# Patient Record
Sex: Female | Born: 1955 | Race: Black or African American | Hispanic: No | Smoking: Never smoker
Health system: Southern US, Community
[De-identification: ages and names within clinical notes are randomized; demographics above are authoritative.]

## PROBLEM LIST (undated history)

## (undated) DIAGNOSIS — I10 Essential (primary) hypertension: Secondary | ICD-10-CM

## (undated) DIAGNOSIS — K219 Gastro-esophageal reflux disease without esophagitis: Secondary | ICD-10-CM

## (undated) DIAGNOSIS — E119 Type 2 diabetes mellitus without complications: Secondary | ICD-10-CM

## (undated) DIAGNOSIS — N189 Chronic kidney disease, unspecified: Secondary | ICD-10-CM

## (undated) DIAGNOSIS — E785 Hyperlipidemia, unspecified: Secondary | ICD-10-CM

## (undated) DIAGNOSIS — R609 Edema, unspecified: Secondary | ICD-10-CM

## (undated) HISTORY — PX: NEPHRECTOMY: SHX65

## (undated) HISTORY — DX: Gastro-esophageal reflux disease without esophagitis: K21.9

## (undated) HISTORY — DX: Essential (primary) hypertension: I10

## (undated) HISTORY — DX: Hyperlipidemia, unspecified: E78.5

## (undated) HISTORY — PX: KIDNEY SURGERY: SHX687

## (undated) HISTORY — DX: Chronic kidney disease, unspecified: N18.9

## (undated) HISTORY — DX: Edema, unspecified: R60.9

## (undated) HISTORY — PX: TONSILLECTOMY: SUR1361

## (undated) HISTORY — PX: ABDOMINAL HYSTERECTOMY: SHX81

## (undated) HISTORY — DX: Type 2 diabetes mellitus without complications: E11.9

---

## 1999-04-18 ENCOUNTER — Other Ambulatory Visit: Admission: RE | Admit: 1999-04-18 | Discharge: 1999-04-18 | Payer: Self-pay | Admitting: Internal Medicine

## 2000-09-25 ENCOUNTER — Encounter: Admission: RE | Admit: 2000-09-25 | Discharge: 2000-09-25 | Payer: Self-pay | Admitting: Urology

## 2000-09-25 ENCOUNTER — Encounter: Payer: Self-pay | Admitting: Urology

## 2000-11-05 ENCOUNTER — Other Ambulatory Visit: Admission: RE | Admit: 2000-11-05 | Discharge: 2000-11-05 | Payer: Self-pay | Admitting: Gynecology

## 2000-12-17 ENCOUNTER — Encounter: Admission: RE | Admit: 2000-12-17 | Discharge: 2001-03-17 | Payer: Self-pay | Admitting: Internal Medicine

## 2002-05-30 ENCOUNTER — Other Ambulatory Visit: Admission: RE | Admit: 2002-05-30 | Discharge: 2002-05-30 | Payer: Self-pay | Admitting: Obstetrics and Gynecology

## 2004-09-26 ENCOUNTER — Ambulatory Visit: Payer: Self-pay | Admitting: Internal Medicine

## 2005-01-20 ENCOUNTER — Emergency Department (HOSPITAL_COMMUNITY): Admission: EM | Admit: 2005-01-20 | Discharge: 2005-01-21 | Payer: Self-pay | Admitting: Emergency Medicine

## 2005-05-13 ENCOUNTER — Emergency Department (HOSPITAL_COMMUNITY): Admission: EM | Admit: 2005-05-13 | Discharge: 2005-05-14 | Payer: Self-pay | Admitting: Emergency Medicine

## 2005-05-16 ENCOUNTER — Ambulatory Visit: Payer: Self-pay | Admitting: Internal Medicine

## 2006-02-19 ENCOUNTER — Ambulatory Visit: Payer: Self-pay | Admitting: Internal Medicine

## 2006-05-22 ENCOUNTER — Ambulatory Visit: Payer: Self-pay | Admitting: Internal Medicine

## 2006-05-22 LAB — CONVERTED CEMR LAB: Streptococcus, Group A Screen (Direct): NEGATIVE

## 2006-12-18 ENCOUNTER — Ambulatory Visit: Payer: Self-pay | Admitting: Internal Medicine

## 2006-12-18 LAB — CONVERTED CEMR LAB
ALT: 16 units/L (ref 0–40)
AST: 18 units/L (ref 0–37)
Albumin: 3.6 g/dL (ref 3.5–5.2)
Alkaline Phosphatase: 85 units/L (ref 39–117)
BUN: 14 mg/dL (ref 6–23)
Bilirubin, Direct: 0.1 mg/dL (ref 0.0–0.3)
CO2: 32 meq/L (ref 19–32)
Calcium: 10 mg/dL (ref 8.4–10.5)
Chloride: 97 meq/L (ref 96–112)
Cholesterol: 333 mg/dL (ref 0–200)
Creatinine, Ser: 0.8 mg/dL (ref 0.4–1.2)
Direct LDL: 235.9 mg/dL
GFR calc Af Amer: 97 mL/min
GFR calc non Af Amer: 80 mL/min
Glucose, Bld: 367 mg/dL — ABNORMAL HIGH (ref 70–99)
HDL: 55.9 mg/dL (ref >39.0)
Hgb A1c MFr Bld: 16.2 % — ABNORMAL HIGH (ref 4.6–6.0)
Potassium: 4.5 meq/L (ref 3.5–5.1)
Sodium: 140 meq/L (ref 135–145)
TSH: 2.86 microintl units/mL (ref 0.35–5.50)
Total Bilirubin: 0.8 mg/dL (ref 0.3–1.2)
Total CHOL/HDL Ratio: 6
Total Protein: 7 g/dL (ref 6.0–8.3)
Triglycerides: 151 mg/dL — ABNORMAL HIGH (ref 0–149)
VLDL: 30 mg/dL (ref 0–40)

## 2007-01-03 ENCOUNTER — Ambulatory Visit: Payer: Self-pay | Admitting: Internal Medicine

## 2007-01-15 ENCOUNTER — Ambulatory Visit: Payer: Self-pay | Admitting: Internal Medicine

## 2007-01-15 LAB — CONVERTED CEMR LAB
Bilirubin Urine: NEGATIVE
Hemoglobin, Urine: NEGATIVE
Ketones, ur: NEGATIVE mg/dL
Leukocytes, UA: NEGATIVE
Nitrite: NEGATIVE
Specific Gravity, Urine: 1.015 (ref 1.000–1.03)
Total Protein, Urine: NEGATIVE mg/dL
Urine Glucose: >=1000 mg/dL — CR
Urobilinogen, UA: 0.2 (ref 0.0–1.0)
pH: 6 (ref 5.0–8.0)

## 2007-02-19 ENCOUNTER — Encounter: Payer: Self-pay | Admitting: Internal Medicine

## 2007-02-19 DIAGNOSIS — R635 Abnormal weight gain: Secondary | ICD-10-CM | POA: Insufficient documentation

## 2007-02-19 DIAGNOSIS — I10 Essential (primary) hypertension: Secondary | ICD-10-CM

## 2007-02-19 DIAGNOSIS — E118 Type 2 diabetes mellitus with unspecified complications: Secondary | ICD-10-CM

## 2007-02-19 DIAGNOSIS — K219 Gastro-esophageal reflux disease without esophagitis: Secondary | ICD-10-CM | POA: Insufficient documentation

## 2007-02-19 DIAGNOSIS — E785 Hyperlipidemia, unspecified: Secondary | ICD-10-CM

## 2007-06-01 LAB — BASIC METABOLIC PANEL
BUN: 4 (ref 4–21)
Creatinine: 9.7 — AB (ref 0.5–1.1)
Glucose: 255
Potassium: 4.5 (ref 3.4–5.3)
Sodium: 141 (ref 137–147)

## 2007-06-01 LAB — HEPATIC FUNCTION PANEL
ALT: 5 — AB (ref 7–35)
AST: 16 (ref 13–35)
Alkaline Phosphatase: 125 (ref 25–125)

## 2008-04-29 ENCOUNTER — Emergency Department (HOSPITAL_COMMUNITY): Admission: EM | Admit: 2008-04-29 | Discharge: 2008-04-29 | Payer: Self-pay | Admitting: Emergency Medicine

## 2008-07-08 ENCOUNTER — Emergency Department (HOSPITAL_COMMUNITY): Admission: EM | Admit: 2008-07-08 | Discharge: 2008-07-08 | Payer: Self-pay | Admitting: Emergency Medicine

## 2008-07-17 ENCOUNTER — Telehealth: Payer: Self-pay | Admitting: Internal Medicine

## 2008-07-17 ENCOUNTER — Ambulatory Visit: Payer: Self-pay | Admitting: Internal Medicine

## 2008-07-21 ENCOUNTER — Telehealth (INDEPENDENT_AMBULATORY_CARE_PROVIDER_SITE_OTHER): Payer: Self-pay | Admitting: *Deleted

## 2008-08-25 ENCOUNTER — Telehealth: Payer: Self-pay | Admitting: Internal Medicine

## 2008-10-20 ENCOUNTER — Telehealth: Payer: Self-pay | Admitting: Internal Medicine

## 2008-12-11 ENCOUNTER — Ambulatory Visit: Payer: Self-pay | Admitting: Internal Medicine

## 2008-12-15 LAB — CONVERTED CEMR LAB
BUN: 13 mg/dL (ref 6–23)
CO2: 30 meq/L (ref 19–32)
Chloride: 102 meq/L (ref 96–112)
Direct LDL: 178.3 mg/dL
Glucose, Bld: 127 mg/dL — ABNORMAL HIGH (ref 70–99)
HDL: 55.9 mg/dL (ref >39.00)
Potassium: 4.6 meq/L (ref 3.5–5.1)
Sodium: 140 meq/L (ref 135–145)
Triglycerides: 101 mg/dL (ref 0.0–149.0)
VLDL: 20.2 mg/dL (ref 0.0–40.0)

## 2009-10-14 ENCOUNTER — Ambulatory Visit: Payer: Self-pay | Admitting: Internal Medicine

## 2009-10-14 LAB — CONVERTED CEMR LAB: Blood Glucose, Fingerstick: 164

## 2009-10-20 ENCOUNTER — Telehealth: Payer: Self-pay | Admitting: Internal Medicine

## 2010-07-26 NOTE — Assessment & Plan Note (Signed)
Summary: CPX / WILL BRING $125 - NO INS / CD   Vital Signs:  Patient profile:   55 year old female Height:      63 inches Weight:      153.25 pounds BMI:     27.25 O2 Sat:      99 % on Room air Temp:     98.1 degrees F oral Pulse rate:   70 / minute BP sitting:   140 / 78  (left arm) Cuff size:   regular  Vitals Entered By: Ernestene Mention (October 14, 2009 10:41 AM)  O2 Flow:  Room air CC: CPX--Per pt no symptoms or complaints./kb Is Patient Diabetic? Yes Did you bring your meter with you today? No Pain Assessment Patient in pain? no      CBG Result 164   CC:  CPX--Per pt no symptoms or complaints./kb.  History of Present Illness: Req   an oral surg  medical clearance She is planning to have:Teeth extraction and a partial next month  Current Medications (verified): 1)  Vitamin B Complex   Caps (B Complex Vitamins) .... Once Daily 2)  Klor-Con 20 Meq  Pack (Potassium Chloride) .... Once Daily 3)  Chlorthalidone 50 Mg  Tabs (Chlorthalidone) .... Take 1 Tablet By Mouth Every Morning 4)  Metformin Hcl 1000 Mg  Tabs (Metformin Hcl) .... Two Times A Day 5)  Vitamin D3 1000 Unit  Tabs (Cholecalciferol) .... Once Daily 6)  Furosemide 20 Mg Tabs (Furosemide) .... Take 1 Tab By Mouth Every Day  Allergies (verified): 1)  ! Penicillin V Potassium (Penicillin V Potassium) 2)  ! Sulfa 3)  ! Betadine Swab Aid (Povidone-Iodine) 4)  ! 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    Physical Exam  General:  well-developed and well-nourished.   Nose:  External nasal examination shows no deformity or inflammation. Nasal mucosa are pink and moist without lesions or exudates. Mouth:  Oral mucosa and oropharynx without lesions or exudates.  Teeth in good repair. Neck:  No deformities, masses, or tenderness noted. Lungs:  Normal respiratory effort, chest expands symmetrically. Lungs are clear to auscultation, no crackles or wheezes. Heart:  Normal rate and regular rhythm. S1 and S2 normal without gallop, murmur, click, rub or other extra sounds. Abdomen:  Bowel sounds positive,abdomen soft and non-tender without masses, organomegaly or hernias noted. Msk:  No deformity or scoliosis noted of thoracic or lumbar spine.   Neurologic:  No cranial nerve deficits noted. Station and gait are normal. Plantar reflexes are down-going bilaterally. DTRs are symmetrical throughout. Sensory, motor and coordinative functions appear intact. Skin:  Lipoma on L sacral area   Impression & Recommendations:  Problem # 1:  HYPERTENSION (ICD-401.9) Assessment Improved  Her updated medication list for this problem includes:    Chlorthalidone 50 Mg Tabs (Chlorthalidone) .Marland Kitchen... Take 1 tablet by mouth every morning    Furosemide 20 Mg Tabs (Furosemide) .Marland Kitchen... Take 1 tab by mouth every day  BP today: 140/78 Prior BP: 140/84 (12/11/2008)  Labs Reviewed: K+: 4.6 (12/11/2008)  Creat: : 0.8 (12/11/2008)   Chol: 252 (12/11/2008)   HDL: 55.90 (12/11/2008)   LDL: DEL (12/18/2006)   TG: 101.0 (12/11/2008)  Problem # 2:  DIABETES MELLITUS, TYPE II (ICD-250.00) Assessment: Unchanged  Her updated medication list for this problem includes:    Metformin Hcl 1000 Mg Tabs (Metformin hcl) .Marland Kitchen..Marland Kitchen Two times a day  Problem # 3:  WEIGHT GAIN (ICD-783.1) Assessment: Improved On diet  Problem # 4:  PREOPERATIVE EXAMINATION (ICD-V72.84) Assessment:  New Clear for surgery  Complete Medication List: 1)  Vitamin B Complex Caps (B complex vitamins) .... Once daily 2)  Klor-con 20 Meq Pack (Potassium chloride) .... Once daily 3)  Chlorthalidone 50 Mg Tabs (Chlorthalidone) .... Take 1 tablet by mouth every morning 4)  Metformin Hcl 1000 Mg Tabs (Metformin hcl) .... Two times a day 5)  Vitamin D3 1000 Unit Tabs (Cholecalciferol) .... Once daily 6)  Furosemide 20 Mg Tabs (Furosemide) .... Take 1 tab by mouth every day  Patient Instructions: 1)  Please schedule a follow-up appointment in 4 months. Prescriptions: METFORMIN HCL 1000 MG  TABS (METFORMIN HCL) two times a day  #180 x 3   Entered and Authorized by:   Cassandria Anger MD   Signed by:   Cassandria Anger MD on 10/14/2009   Method used:   Electronically to        [facility] (retail)       [phone]       [place], [place]  [address]       Ph: [phone]       Fax: [phone]   RxID:   [id number] CHLORTHALIDONE 50 MG  TABS (CHLORTHALIDONE) Take 1 tablet by mouth every morning  #90 x 3   Entered and Authorized by:   Cassandria Anger MD   Signed by:   Cassandria Anger MD on 10/14/2009   Method used:   Electronically to        [facility] (retail)       [phone]       [place], [place]  [address]       Ph: [phone]       Fax: [phone]   RxID:   [id number] KLOR-CON 20 MEQ  PACK (POTASSIUM CHLORIDE) once daily  #90 x 3   Entered and Authorized by:   Cassandria Anger MD   Signed by:   Cassandria Anger MD on 10/14/2009   Method used:   Electronically to        [facility] (retail)       [phone]       [place], [place]  [address]       Ph: [phone]       Fax: [phone]   RxID:   [id number]

## 2010-07-26 NOTE — Letter (Signed)
Summary: Generic Letter  [facility]   [place], [place] [address]   Phone: [phone]  Fax: [phone]    10/14/2009  RE: Mary Valencia [address] [place], [place]  [address]  Ms. ROTERT is medically clear for dental work/ surgery.           Sincerely,   Lew Dawes MD

## 2010-07-26 NOTE — Progress Notes (Signed)
  Phone Note From Pharmacy   Caller: [facility] Summary of Call: Received fax from pharmacy regarding  lotensin and amaryl rx denial (no longer on med list) stating that pt is requesting refills. Spoke with patient who advised that it was an error and she does still use the medication. She is requesting 90day Initial call taken by: Estell Harpin CMA,  October 20, 2009 2:30 PM    New/Updated Medications: LOTENSIN 40 MG TABS (BENAZEPRIL HCL) Take 1 tablet by mouth once a day AMARYL 1 MG TABS (GLIMEPIRIDE) Take 1 tablet by mouth two times a day Prescriptions: AMARYL 1 MG TABS (GLIMEPIRIDE) Take 1 tablet by mouth two times a day  #180 x 3   Entered by:   Estell Harpin CMA   Authorized by:   Cassandria Anger MD   Signed by:   Estell Harpin CMA on 10/20/2009   Method used:   Electronically to        [facility] (retail)       [phone]       [place], [place]  [address]       Ph: [phone]       Fax: [phone]   RxID:   [id number] LOTENSIN 40 MG TABS (BENAZEPRIL HCL) Take 1 tablet by mouth once a day  #90 x 3   Entered by:   Estell Harpin CMA   Authorized by:   Cassandria Anger MD   Signed by:   Estell Harpin CMA on 10/20/2009   Method used:   Electronically to        [facility].* (retail)       [phone]       [place], [place]  [address]       Ph: [phone]       Fax: [phone]   RxID:   [id number]

## 2010-09-02 ENCOUNTER — Telehealth: Payer: Self-pay | Admitting: Internal Medicine

## 2010-09-05 ENCOUNTER — Ambulatory Visit (INDEPENDENT_AMBULATORY_CARE_PROVIDER_SITE_OTHER)
Admission: RE | Admit: 2010-09-05 | Discharge: 2010-09-05 | Disposition: A | Discharge status: Home / Self Care | Payer: Self-pay | Source: Ambulatory Visit | Attending: Internal Medicine | Admitting: Internal Medicine

## 2010-09-05 ENCOUNTER — Encounter: Payer: Self-pay | Admitting: Internal Medicine

## 2010-09-05 ENCOUNTER — Other Ambulatory Visit: Payer: Self-pay | Admitting: Internal Medicine

## 2010-09-05 ENCOUNTER — Ambulatory Visit (INDEPENDENT_AMBULATORY_CARE_PROVIDER_SITE_OTHER): Payer: Self-pay | Admitting: Internal Medicine

## 2010-09-05 ENCOUNTER — Other Ambulatory Visit: Payer: Self-pay

## 2010-09-05 DIAGNOSIS — R000 Tachycardia, unspecified: Secondary | ICD-10-CM

## 2010-09-05 DIAGNOSIS — E119 Type 2 diabetes mellitus without complications: Secondary | ICD-10-CM

## 2010-09-05 DIAGNOSIS — R0602 Shortness of breath: Secondary | ICD-10-CM

## 2010-09-05 DIAGNOSIS — J309 Allergic rhinitis, unspecified: Secondary | ICD-10-CM

## 2010-09-05 LAB — TSH: TSH: 1.19 u[IU]/mL (ref 0.35–5.50)

## 2010-09-05 LAB — CBC WITH DIFFERENTIAL/PLATELET
Basophils Absolute: 0 10*3/uL (ref 0.0–0.1)
Eosinophils Absolute: 0 10*3/uL (ref 0.0–0.7)
HCT: 40 % (ref 36.0–46.0)
Hemoglobin: 13.6 g/dL (ref 12.0–15.0)
Lymphocytes Relative: 22.6 % (ref 12.0–46.0)
Lymphs Abs: 2 10*3/uL (ref 0.7–4.0)
MCHC: 33.9 g/dL (ref 30.0–36.0)
Monocytes Relative: 3.8 % (ref 3.0–12.0)
Neutro Abs: 6.4 10*3/uL (ref 1.4–7.7)
Platelets: 260 10*3/uL (ref 150.0–400.0)
RDW: 12.5 % (ref 11.5–14.6)

## 2010-09-05 LAB — BASIC METABOLIC PANEL
BUN: 27 mg/dL — ABNORMAL HIGH (ref 6–23)
CO2: 29 mEq/L (ref 19–32)
Calcium: 10 mg/dL (ref 8.4–10.5)
GFR: 69.66 mL/min (ref >60.00)
Glucose, Bld: 348 mg/dL — ABNORMAL HIGH (ref 70–99)

## 2010-09-05 LAB — URINALYSIS, ROUTINE W REFLEX MICROSCOPIC
Ketones, ur: NEGATIVE
Specific Gravity, Urine: 1.02 (ref 1.000–1.030)
Total Protein, Urine: NEGATIVE
Urine Glucose: >=1000
Urobilinogen, UA: 0.2 (ref 0.0–1.0)
pH: 5.5 (ref 5.0–8.0)

## 2010-09-05 LAB — HEPATIC FUNCTION PANEL
AST: 16 U/L (ref 0–37)
Albumin: 4.4 g/dL (ref 3.5–5.2)
Total Bilirubin: 0.4 mg/dL (ref 0.3–1.2)

## 2010-09-12 ENCOUNTER — Telehealth: Payer: Self-pay | Admitting: Internal Medicine

## 2010-09-13 NOTE — Assessment & Plan Note (Signed)
Summary: body temp dropped to 96.6/heart palpatations and shortness of...   Vital Signs:  Patient profile:   55 year old female Height:      63 inches (160.02 cm) Weight:      149.25 pounds (67.84 kg) BMI:     26.53 O2 Sat:      98 % on Room air Temp:     98.3 degrees F (36.83 degrees C) oral Pulse rate:   113 / minute Resp:     16 per minute BP sitting:   142 / 78  (right arm) Cuff size:   regular  Vitals Entered By: Mackey Birchwood CMA(AAMA) (September 05, 2010 2:11 PM)  O2 Flow:  Room air CC: Pt c/o Low body temperature over past 2 months; palpatations and SOB/sls,cma Comments Pt states that she believes her Potassium levels may be low. Pt has not checked BSL in 6-7 months; states she can not find her BS meter.   CC:  Pt c/o Low body temperature over past 2 months; palpatations and SOB/sls and cma.  History of Present Illness: C/o SOB and palpitations x 1 wk, loose stools. No CP. C/o low T 97.1 C/o possibly low  K She is going to [place] for 14 d  Current Medications (verified): 1)  Vitamin B Complex   Caps (B Complex Vitamins) .... Once Daily 2)  Klor-Con 20 Meq  Pack (Potassium Chloride) .... Once Daily 3)  Chlorthalidone 50 Mg  Tabs (Chlorthalidone) .... Take 1 Tablet By Mouth Every Morning 4)  Metformin Hcl 1000 Mg  Tabs (Metformin Hcl) .... Two Times A Day 5)  Vitamin D3 1000 Unit  Tabs (Cholecalciferol) .... Once Daily 6)  Furosemide 20 Mg Tabs (Furosemide) .... Take 1 Tab By Mouth Every Day As Needed 7)  Lotensin 40 Mg Tabs (Benazepril Hcl) .... Take 1 Tablet By Mouth Once A Day 8)  Amaryl 1 Mg Tabs (Glimepiride) .... Take 1 Tablet By Mouth Two Times A Day  Allergies (verified): 1)  ! Penicillin V Potassium (Penicillin V Potassium) 2)  ! Sulfa 3)  ! Betadine Swab Aid (Povidone-Iodine) 4)  ! 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    Physical Exam  General:  well-developed and overweight-appearing.   Mouth:  Oral mucosa and oropharynx without lesions or exudates.  Teeth in good repair. Neck:  No deformities, masses, or tenderness noted. Lungs:  Normal respiratory effort, chest expands symmetrically. Lungs are clear to auscultation, no crackles or wheezes. Heart:  Normal rate and regular rhythm. S1 and S2 normal without gallop, murmur, click, rub or other extra sounds. Abdomen:  Bowel sounds positive,abdomen soft and non-tender without masses, organomegaly or hernias noted. Msk:  No deformity or scoliosis noted of thoracic or lumbar spine.   Neurologic:  No cranial nerve deficits noted. Station and gait are normal. Plantar reflexes are down-going bilaterally. DTRs are symmetrical throughout. Sensory, motor and coordinative functions appear intact. Skin:  Lipoma on L sacral area   Impression & Recommendations:  Problem # 1:  DYSPNEA (ICD-786.05) unclear etiology; poss due to #2 Assessment New  Orders: EKG w/  Interpretation (93000) T-2 View CXR, Same Day (C9260230.5TC) T-D-Dimer Fibrin Derivatives Quantitive [id number]) TLB-B12, Serum-Total ONLY [id number]) TLB-BNP (B-Natriuretic Peptide) (83880-BNPR) TLB-BMP (Basic Metabolic Panel-BMET) (99991111) TLB-CBC Platelet - w/Differential (85025-CBCD) TLB-Hepatic/Liver Function Pnl (80076-HEPATIC) TLB-TSH (Thyroid Stimulating Hormone) (84443-TSH) TLB-Udip ONLY (81003-UDIP) TLB-CK-MB (Creatine Kinase MB) (82553-CKMB)  Problem # 2:  TACHYCARDIA (ICD-785.0) - poss PSVT Assessment: New  Orders: EKG w/ Interpretation  (93000) T-D-Dimer Fibrin Derivatives Quantitive [id number]) TLB-Magnesium (Mg) (83735-MG) TLB-B12, Serum-Total ONLY [id number]) TLB-BNP (B-Natriuretic Peptide) (83880-BNPR) TLB-BMP (Basic Metabolic Panel-BMET) (99991111) TLB-CBC Platelet - w/Differential (85025-CBCD) TLB-Hepatic/Liver Function Pnl (80076-HEPATIC) TLB-TSH (Thyroid Stimulating Hormone) (84443-TSH) TLB-Udip ONLY (81003-UDIP) TLB-CK-MB (Creatine Kinase MB) (82553-CKMB)  Problem # 3:  HYPERTENSION (ICD-401.9) Assessment: Unchanged  Her updated medication list for this problem includes:    Chlorthalidone 50 Mg Tabs (Chlorthalidone) .Marland Kitchen... Take 1 tablet by mouth every morning    Furosemide 20 Mg Tabs (Furosemide) .Marland Kitchen... Take 1 tab by mouth every day as needed    Lotensin 40 Mg Tabs (Benazepril hcl) .Marland Kitchen... Take 1 tablet by mouth once a day    Toprol Xl 25 Mg Xr24h-tab (Metoprolol succinate) .Marland Kitchen... 1 by mouth qd  BP today: 142/78 Prior BP: 140/78 (10/14/2009)  Labs Reviewed: K+: 4.6 (12/11/2008) Creat: : 0.8 (12/11/2008)   Chol: 252 (12/11/2008)   HDL: 55.90 (12/11/2008)   LDL: DEL (12/18/2006)   TG: 101.0 (12/11/2008)  Problem # 4:  DIABETES MELLITUS, TYPE II (ICD-250.00) Assessment: Deteriorated  The following medications were removed from the medication list:    Amaryl 1 Mg Tabs (Glimepiride) .Marland Kitchen... Take 1 tablet by mouth two times a day Her updated medication list for this problem includes:    Metformin Hcl 1000 Mg Tabs (Metformin hcl) .Marland Kitchen..Marland Kitchen Two times a day    Lotensin 40 Mg Tabs (Benazepril hcl) .Marland Kitchen... Take 1 tablet by mouth once a day    Amaryl 2 Mg Tabs (Glimepiride) .Marland Kitchen... 1 by mouth bid  Orders: T-D-Dimer Fibrin Derivatives Quantitive [id number]) TLB-B12, Serum-Total ONLY [id number]) TLB-BNP (B-Natriuretic Peptide) (83880-BNPR) TLB-BMP (Basic Metabolic Panel-BMET) (99991111) TLB-CBC Platelet - w/Differential (85025-CBCD) TLB-Hepatic/Liver Function Pnl (80076-HEPATIC) TLB-TSH (Thyroid Stimulating  Hormone) (84443-TSH) TLB-Udip ONLY (81003-UDIP) TLB-CK-MB (Creatine Kinase MB) (82553-CKMB)  Labs Reviewed: Creat: 0.8 (12/11/2008)    Reviewed HgBA1c results: 7.9 (12/11/2008)  16.2 (12/18/2006)  Complete Medication List: 1)  Vitamin B Complex Caps (B complex vitamins) .... Once daily 2)  Klor-con 20 Meq Pack (Potassium chloride) .... Once daily 3)  Chlorthalidone 50 Mg Tabs (Chlorthalidone) .... Take 1 tablet by mouth every morning 4)  Metformin Hcl 1000 Mg Tabs (Metformin hcl) .... Two times a day 5)  Vitamin D3 1000 Unit Tabs (Cholecalciferol) .... Once daily 6)  Furosemide 20 Mg Tabs (Furosemide) .... Take 1 tab by mouth every day as needed 7)  Lotensin 40 Mg Tabs (Benazepril hcl) .... Take 1 tablet by mouth once a day 8)  Toprol Xl 25 Mg Xr24h-tab (Metoprolol succinate) .Marland Kitchen.. 1 by mouth qd 9)  Loratadine 10 Mg Tabs (Loratadine) .Marland Kitchen.. 1 by mouth once daily as needed allergies 10)  Amaryl 2 Mg Tabs (Glimepiride) .Marland Kitchen.. 1 by mouth bid  Patient Instructions: 1)  Please schedule a follow-up appointment in 1 month. 2)  Call if you are not better in a reasonable amount of time or if worse. Go to ER if feeling really bad!  Prescriptions: AMARYL 2 MG TABS (GLIMEPIRIDE) 1 by mouth bid  #60 x 11   Entered and Authorized by:   Cassandria Anger MD   Signed by:  Cassandria Anger MD on 09/09/2010   Method used:   Print then Mail to Patient   RxID:   [id number] AMARYL 1 MG TABS (GLIMEPIRIDE) Take 1 tablet by mouth two times a day  #180 x 3   Entered and Authorized by:   Cassandria Anger MD   Signed by:   Cassandria Anger MD on 09/05/2010   Method used:   Print then Give to Patient   RxID:   [id number] LOTENSIN 40 MG TABS (BENAZEPRIL HCL) Take 1 tablet by mouth once a day  #90 x 3   Entered and Authorized by:   Cassandria Anger MD   Signed by:   Cassandria Anger MD on 09/05/2010   Method used:   Print then Give to Patient   [id number] VITAMIN D3  1000 UNIT  TABS (CHOLECALCIFEROL) once daily  #100 x 3   Entered and Authorized by:   Cassandria Anger MD   Signed by:   Cassandria Anger MD on 09/05/2010   Method used:   Print then Give to Patient   RxID:   [id number] METFORMIN HCL 1000 MG  TABS (METFORMIN HCL) two times a day  #180 x 3   Entered and Authorized by:   Cassandria Anger MD   Signed by:   Cassandria Anger MD on 09/05/2010   Method used:   Print then Give to Patient   RxID:   [id number] CHLORTHALIDONE 50 MG  TABS (CHLORTHALIDONE) Take 1 tablet by mouth every morning  #90 x 3   Entered and Authorized by:   Cassandria Anger MD   Signed by:   Cassandria Anger MD on 09/05/2010   Method used:   Print then Give to Patient   RxID:   [id number] KLOR-CON 20 MEQ  PACK (POTASSIUM CHLORIDE) once daily  #90 x 3   Entered and Authorized by:   Cassandria Anger MD   Signed by:   Cassandria Anger MD on 09/05/2010   Method used:   Print then Give to Patient   RxID:   [id number] LORATADINE 10 MG TABS (LORATADINE) 1 by mouth once daily as needed allergies  #90 x 3   Entered and Authorized by:   Cassandria Anger MD   Signed by:   Cassandria Anger MD on 09/05/2010   Method used:   Print then Give to Patient   RxID:   [id number] TOPROL XL 25 MG XR24H-TAB (METOPROLOL SUCCINATE) 1 by mouth qd  #30 x 11   Entered and Authorized by:   Cassandria Anger MD   Signed by:   Cassandria Anger MD on 09/05/2010   Method used:   Print then Give to Patient   RxID:   [id number]    Orders Added: 1)  EKG w/ Interpretation [93000] 2)  T-2 View CXR, Same Day [71020.5TC] 3)  T-D-Dimer Fibrin Derivatives Quantitive [id number] 4)  TLB-Magnesium (Mg) [83735-MG] 5)  Est. Patient Level IV [id number] 6)  TLB-B12, Serum-Total ONLY [82607-B12] 7)  TLB-BNP (B-Natriuretic Peptide) [83880-BNPR] 8)  TLB-BMP (Basic Metabolic Panel-BMET) 123456 9)  TLB-CBC Platelet - w/Differential  [85025-CBCD] 10)  TLB-Hepatic/Liver Function Pnl [80076-HEPATIC] 11)  TLB-TSH (Thyroid Stimulating Hormone) [84443-TSH] 12)  TLB-Udip ONLY [81003-UDIP] 13)  TLB-CK-MB (Creatine Kinase MB) [82553-CKMB]

## 2010-09-13 NOTE — Progress Notes (Signed)
Summary: Med Problem  Phone Note Call from Patient   Caller: Patient Summary of Call: Pt called to schedule appt for next week but scheduler felt she needed to speak w/nurse while on phone. Pt staes that her body temperature had been dropping over the past 2 months and was at 96.6 this morning. Pt was asked about the SOB & heart Palpatations symptoms that she gave as reason for calling to schedule appt. Pt stated that it was "much, much better" today and that it has been occuring over the past week. Pt stated that she has been taking Potassium meds over the past three days that expired 2 Years ago.? Informed Pt that she should never take expired meds and that if symptoms returned and/or became worse to go to ED to be evaluated. Pt agreed to this and confirmed that she will keep appt for [facility] 09/05/10 @ 2:00pm. Initial call taken by: Mackey Birchwood [facility]),  September 02, 2010 12:07 PM  Follow-up for Phone Call        Noted. Thank you!  Follow-up by: Cassandria Anger MD,  September 02, 2010 10:32 PM

## 2010-09-14 ENCOUNTER — Telehealth: Payer: Self-pay | Admitting: *Deleted

## 2010-09-14 MED ORDER — GLIMEPIRIDE 2 MG PO TABS: 2.0000 mg | ORAL_TABLET | Freq: Two times a day (BID) | ORAL | Status: DC

## 2010-09-14 NOTE — Telephone Encounter (Signed)
Pt needs rf of med. Pt aware rx sent in

## 2010-09-22 NOTE — Progress Notes (Signed)
Summary: Lipids checked  Phone Note Call from Patient   Caller: Patient Summary of Call: pt called concerned about not having lipids checked last visit. She wants it checked prior to April 11th appt....ok to add order??? Initial call taken by: Jonathon Resides, [facility]),  September 12, 2010 9:11 AM  Follow-up for Phone Call        ok Thank you!  Follow-up by: Cassandria Anger MD,  September 12, 2010 6:08 PM  Additional Follow-up for Phone Call Additional follow up Details #1::        Please add to [facility], thanks Additional Follow-up by: Charlsie Quest, CMA,  September 12, 2010 6:10 PM

## 2010-09-29 ENCOUNTER — Other Ambulatory Visit (INDEPENDENT_AMBULATORY_CARE_PROVIDER_SITE_OTHER): Payer: Self-pay

## 2010-09-29 DIAGNOSIS — E785 Hyperlipidemia, unspecified: Secondary | ICD-10-CM

## 2010-09-29 LAB — LIPID PANEL
HDL: 62.8 mg/dL (ref >39.00)
Total CHOL/HDL Ratio: 5
VLDL: 18.2 mg/dL (ref 0.0–40.0)

## 2010-09-29 LAB — HEPATIC FUNCTION PANEL
AST: 13 U/L (ref 0–37)
Albumin: 3.7 g/dL (ref 3.5–5.2)
Alkaline Phosphatase: 75 U/L (ref 39–117)
Bilirubin, Direct: 0.1 mg/dL (ref 0.0–0.3)
Total Bilirubin: 0.5 mg/dL (ref 0.3–1.2)

## 2010-09-29 LAB — LDL CHOLESTEROL, DIRECT: Direct LDL: 207.8 mg/dL

## 2010-09-30 ENCOUNTER — Other Ambulatory Visit: Payer: Self-pay

## 2010-10-05 ENCOUNTER — Encounter: Payer: Self-pay | Admitting: Internal Medicine

## 2010-10-05 ENCOUNTER — Ambulatory Visit (INDEPENDENT_AMBULATORY_CARE_PROVIDER_SITE_OTHER): Payer: Self-pay | Admitting: Internal Medicine

## 2010-10-05 DIAGNOSIS — K219 Gastro-esophageal reflux disease without esophagitis: Secondary | ICD-10-CM

## 2010-10-05 DIAGNOSIS — R000 Tachycardia, unspecified: Secondary | ICD-10-CM

## 2010-10-05 DIAGNOSIS — E785 Hyperlipidemia, unspecified: Secondary | ICD-10-CM

## 2010-10-05 DIAGNOSIS — E119 Type 2 diabetes mellitus without complications: Secondary | ICD-10-CM

## 2010-10-05 DIAGNOSIS — J309 Allergic rhinitis, unspecified: Secondary | ICD-10-CM

## 2010-10-05 MED ORDER — GLIMEPIRIDE 4 MG PO TABS: 4.0000 mg | ORAL_TABLET | Freq: Two times a day (BID) | ORAL | Status: DC

## 2010-10-05 MED ORDER — POTASSIUM CHLORIDE CRYS ER 20 MEQ PO TBCR: 20.0000 meq | EXTENDED_RELEASE_TABLET | Freq: Two times a day (BID) | ORAL | Status: DC

## 2010-10-05 NOTE — Assessment & Plan Note (Signed)
On Rx 

## 2010-10-05 NOTE — Assessment & Plan Note (Signed)
Better  

## 2010-10-05 NOTE — Assessment & Plan Note (Signed)
Better off her herbal med

## 2010-10-05 NOTE — Progress Notes (Signed)
  Subjective:    Patient ID: Mary Valencia, female    DOB: [birth year], 55 y.o.   MRN: [id number]  HPI The patient presents for a follow-up of palpitations - stopped after she ended an [place] herb compound for menopause 2 d ago,  chronic hypertension, chronic dyslipidemia, type 2 diabetes not well controlled with medicines (>260)     Review of Systems  Constitutional: Positive for diaphoresis. Negative for fatigue.  HENT: Negative for congestion.   Respiratory: Negative for cough and choking.   Cardiovascular: Negative for chest pain, palpitations (better) and leg swelling.  Genitourinary: Negative for dysuria and pelvic pain.  Musculoskeletal: Negative for back pain and gait problem.  Neurological: Negative for headaches.  Psychiatric/Behavioral: Negative for dysphoric mood.       Objective:   Physical Exam  Constitutional: She appears well-developed and well-nourished. No distress.  HENT:  Head: Normocephalic.  Right Ear: External ear normal.  Left Ear: External ear normal.  Nose: Nose normal.  Mouth/Throat: Oropharynx is clear and moist.  Eyes: Conjunctivae are normal. Pupils are equal, round, and reactive to light. Right eye exhibits no discharge. Left eye exhibits no discharge.  Neck: Normal range of motion. Neck supple. No JVD present. No tracheal deviation present. No thyromegaly present.  Cardiovascular: Normal rate, regular rhythm and normal heart sounds.   Pulmonary/Chest: No stridor. No respiratory distress. She has no wheezes.  Abdominal: Soft. Bowel sounds are normal. She exhibits no distension and no mass. There is no tenderness. There is no rebound and no guarding.  Musculoskeletal: She exhibits no edema and no tenderness.  Lymphadenopathy:    She has no cervical adenopathy.  Neurological: She displays normal reflexes. No cranial nerve deficit. She exhibits normal muscle tone. Coordination normal.  Skin: No rash noted. No erythema.  Psychiatric: She has a  normal mood and affect. Her behavior is normal. Judgment and thought content normal.        Lab Results  Component Value Date   WBC 8.7 09/05/2010   HGB 13.6 09/05/2010   HCT 40.0 09/05/2010   PLT 260.0 09/05/2010   CHOL 289* 09/29/2010   TRIG 91.0 09/29/2010   HDL 62.80 09/29/2010   LDLDIRECT 207.8 09/29/2010   ALT 10 09/29/2010   AST 13 09/29/2010   NA 138 09/05/2010   K 3.9 09/05/2010   CL 95* 09/05/2010   CREATININE 1.1 09/05/2010   BUN 27* 09/05/2010   CO2 29 09/05/2010   TSH 1.19 09/05/2010   HGBA1C 7.9* 12/11/2008     Assessment & Plan:  DIABETES MELLITUS, TYPE II Not well controlled. See med change  HYPERLIPIDEMIA On diet  TACHYCARDIA Better off her herbal med  GERD Better.  ALLERGIC RHINITIS On Rx

## 2010-10-05 NOTE — Assessment & Plan Note (Signed)
  On diet  

## 2010-10-05 NOTE — Assessment & Plan Note (Signed)
Not well controlled. See med change

## 2010-10-10 LAB — RAPID STREP SCREEN (MED CTR MEBANE ONLY): Streptococcus, Group A Screen (Direct): NEGATIVE

## 2010-10-10 LAB — GLUCOSE, CAPILLARY
Glucose-Capillary: 319 mg/dL — ABNORMAL HIGH (ref 70–99)
Glucose-Capillary: 465 mg/dL — ABNORMAL HIGH (ref 70–99)

## 2010-12-14 ENCOUNTER — Other Ambulatory Visit: Payer: Self-pay | Admitting: Internal Medicine

## 2011-01-16 ENCOUNTER — Ambulatory Visit: Payer: Self-pay | Admitting: Internal Medicine

## 2011-01-18 ENCOUNTER — Other Ambulatory Visit: Payer: Self-pay

## 2011-01-18 ENCOUNTER — Ambulatory Visit (INDEPENDENT_AMBULATORY_CARE_PROVIDER_SITE_OTHER): Payer: Self-pay | Admitting: Internal Medicine

## 2011-01-18 ENCOUNTER — Encounter: Payer: Self-pay | Admitting: Internal Medicine

## 2011-01-18 DIAGNOSIS — N939 Abnormal uterine and vaginal bleeding, unspecified: Secondary | ICD-10-CM | POA: Insufficient documentation

## 2011-01-18 DIAGNOSIS — E119 Type 2 diabetes mellitus without complications: Secondary | ICD-10-CM

## 2011-01-18 DIAGNOSIS — I10 Essential (primary) hypertension: Secondary | ICD-10-CM

## 2011-01-18 DIAGNOSIS — N898 Other specified noninflammatory disorders of vagina: Secondary | ICD-10-CM

## 2011-01-18 DIAGNOSIS — R000 Tachycardia, unspecified: Secondary | ICD-10-CM

## 2011-01-18 LAB — WET PREP, GENITAL

## 2011-01-18 MED ORDER — GLIPIZIDE 5 MG PO TABS: 5.0000 mg | ORAL_TABLET | Freq: Two times a day (BID) | ORAL | Status: DC

## 2011-01-18 NOTE — Assessment & Plan Note (Signed)
Resolved

## 2011-01-18 NOTE — Progress Notes (Signed)
  Subjective:    Patient ID: Mary Valencia, female    DOB: 1956/04/07, 55 y.o.   MRN: [id number]  HPI  C/o elev glucose. CBG 200-270 range C/o vag bleeding on 4 mg Amaryl; switched to 1 mg bid - bleeding stopped. She had a hysterectomy.  Review of Systems  Constitutional: Negative for chills, activity change, appetite change, fatigue and unexpected weight change.  HENT: Negative for congestion, mouth sores and sinus pressure.   Eyes: Negative for visual disturbance.  Respiratory: Negative for cough and chest tightness.   Gastrointestinal: Negative for nausea and abdominal pain.  Genitourinary: Negative for frequency, difficulty urinating and vaginal pain.  Musculoskeletal: Negative for back pain and gait problem.  Skin: Negative for pallor and rash.  Neurological: Negative for dizziness, tremors, weakness, numbness and headaches.  Psychiatric/Behavioral: Negative for confusion and sleep disturbance.       Objective:   Physical Exam  Constitutional: She appears well-developed. No distress.       obese  HENT:  Head: Normocephalic.  Right Ear: External ear normal.  Left Ear: External ear normal.  Nose: Nose normal.  Mouth/Throat: Oropharynx is clear and moist.  Eyes: Conjunctivae are normal. Pupils are equal, round, and reactive to light. Right eye exhibits no discharge. Left eye exhibits no discharge.  Neck: Normal range of motion. Neck supple. No JVD present. No tracheal deviation present. No thyromegaly present.  Cardiovascular: Normal rate, regular rhythm and normal heart sounds.   Pulmonary/Chest: No stridor. No respiratory distress. She has no wheezes.  Abdominal: Soft. Bowel sounds are normal. She exhibits no distension and no mass. There is no tenderness. There is no rebound and no guarding.  Genitourinary: Guaiac negative stool. Vaginal discharge (mild) found.       Vag and rect exam - no mass Vag mucosa is atrophic  Musculoskeletal: She exhibits no edema and no  tenderness.  Lymphadenopathy:    She has no cervical adenopathy.  Neurological: She displays normal reflexes. No cranial nerve deficit. She exhibits normal muscle tone. Coordination normal.  Skin: No rash noted. No erythema.  Psychiatric: She has a normal mood and affect. Her behavior is normal. Judgment and thought content normal.          Assessment & Plan:

## 2011-01-18 NOTE — Patient Instructions (Signed)
Goal sugars 120-150

## 2011-01-18 NOTE — Assessment & Plan Note (Signed)
On Rx 

## 2011-01-18 NOTE — Assessment & Plan Note (Signed)
better 

## 2011-01-18 NOTE — Assessment & Plan Note (Signed)
Change to Glipizide

## 2011-01-19 ENCOUNTER — Telehealth: Payer: Self-pay | Admitting: Internal Medicine

## 2011-01-19 NOTE — Telephone Encounter (Signed)
Mary Valencia, please, inform patient that vag prep was ok Thx

## 2011-01-19 NOTE — Telephone Encounter (Signed)
Pt informed

## 2011-03-01 ENCOUNTER — Ambulatory Visit: Payer: Self-pay | Admitting: Internal Medicine

## 2011-03-06 ENCOUNTER — Other Ambulatory Visit: Payer: Self-pay | Admitting: *Deleted

## 2011-03-06 MED ORDER — METOPROLOL SUCCINATE ER 25 MG PO TB24: 25.0000 mg | ORAL_TABLET | Freq: Every day | ORAL | Status: DC

## 2011-03-28 LAB — GLUCOSE, CAPILLARY: Glucose-Capillary: 236 — ABNORMAL HIGH

## 2011-03-28 LAB — CBC
HCT: 35.7 — ABNORMAL LOW
MCV: 83.5
Platelets: 261
RDW: 12
WBC: 8.3

## 2011-03-28 LAB — POCT CARDIAC MARKERS
CKMB, poc: 1.3
Myoglobin, poc: 91.2
Troponin i, poc: <0.05
Troponin i, poc: <0.05

## 2011-03-28 LAB — DIFFERENTIAL
Basophils Absolute: 0
Basophils Relative: 0
Eosinophils Absolute: 0
Eosinophils Relative: 1
Neutrophils Relative %: 62

## 2011-03-28 LAB — POCT I-STAT, CHEM 8
HCT: 36
Hemoglobin: 12.2
Potassium: 4.2
Sodium: 139
TCO2: 32

## 2011-05-03 ENCOUNTER — Telehealth: Payer: Self-pay | Admitting: *Deleted

## 2011-05-03 NOTE — Telephone Encounter (Signed)
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 Please advise ok?

## 2011-05-03 NOTE — Telephone Encounter (Signed)
Fax to: [facility] @ [phone]

## 2011-05-03 NOTE — Telephone Encounter (Signed)
Ok thx.

## 2011-05-04 NOTE — Telephone Encounter (Signed)
Letter signed/faxed to number below. 

## 2011-05-04 NOTE — Telephone Encounter (Signed)
Letter printed/pending MD sig.

## 2011-07-28 ENCOUNTER — Telehealth: Payer: Self-pay | Admitting: *Deleted

## 2011-07-28 NOTE — Telephone Encounter (Signed)
Pt states that she experienced a needle stick after helping husband check his blood sugar and help with his insulin. Pt is asking for MD's advisement regarding needle stick. (Called pt at number requested to get more information, no answer, unable to leave message)

## 2011-07-31 NOTE — Telephone Encounter (Signed)
Just watch it. We can discuss it at the next OV.  Thx

## 2011-08-01 NOTE — Telephone Encounter (Signed)
Called pt- spoke to husband who state pt is not at home and will return after 3 pm. Will try again later.

## 2011-08-01 NOTE — Telephone Encounter (Signed)
Pt informed

## 2011-08-08 ENCOUNTER — Other Ambulatory Visit: Payer: Self-pay | Admitting: Internal Medicine

## 2011-09-18 ENCOUNTER — Other Ambulatory Visit: Payer: Self-pay | Admitting: *Deleted

## 2011-09-18 MED ORDER — METOPROLOL SUCCINATE ER 25 MG PO TB24: 25.0000 mg | ORAL_TABLET | Freq: Every day | ORAL | Status: DC

## 2011-09-25 ENCOUNTER — Encounter: Payer: Self-pay | Admitting: Internal Medicine

## 2011-09-25 ENCOUNTER — Telehealth: Payer: Self-pay

## 2011-09-25 ENCOUNTER — Ambulatory Visit (INDEPENDENT_AMBULATORY_CARE_PROVIDER_SITE_OTHER): Payer: Self-pay | Admitting: Internal Medicine

## 2011-09-25 VITALS — BP 132/74 | HR 95 | Temp 98.2°F | Ht 62.0 in | Wt 132.5 lb

## 2011-09-25 DIAGNOSIS — I10 Essential (primary) hypertension: Secondary | ICD-10-CM

## 2011-09-25 DIAGNOSIS — R0789 Other chest pain: Secondary | ICD-10-CM

## 2011-09-25 DIAGNOSIS — E785 Hyperlipidemia, unspecified: Secondary | ICD-10-CM

## 2011-09-25 DIAGNOSIS — E119 Type 2 diabetes mellitus without complications: Secondary | ICD-10-CM

## 2011-09-25 MED ORDER — INSULIN GLARGINE 100 UNIT/ML ~~LOC~~ SOLN: 5.0000 [IU] | Freq: Every day | SUBCUTANEOUS | Status: DC

## 2011-09-25 MED ORDER — LINAGLIPTIN-METFORMIN HCL 2.5-500 MG PO TABS: 1.0000 | ORAL_TABLET | Freq: Two times a day (BID) | ORAL | Status: DC

## 2011-09-25 NOTE — Telephone Encounter (Signed)
[facility] Triage Call Report Triage Record Num: [id number] Operator: Arther Abbott Patient Name: Mary Valencia Call Date & Time: 09/23/2011 5:07:56AM Patient Phone: [phone] PCP: Walker Kehr Patient Gender: Female PCP Fax : [phone] Patient DOB: 1955/07/20 Practice Name: [facility] Reason for Call: Caller: Abelino Derrick; PCP: Walker Kehr; CB#: [phone];In hospital in [place] and wants to speak to MD about Stress Test. Pt. states currently in ER in [place] and states was having chest pain. "They have checked out everything here and everything was okay, my blood sugar is 540 so there was some concerns about that, but they want to do a Stress Test and I dont want them too. I just want to wait and come back to [place]. The doctor here said to call my doctor and see how they feel about that." Advised pt. that MD on call would defer to ER MD since he has all lab work and information. Pt. voiced understanding. Protocol(s) Used: Office Note Recommended Outcome per Protocol: Information Noted and Sent to Office Reason for Outcome: Caller information to office Care Advice: ~ 09/23/2011 5:14:31AM Page 1 of 1 [email or web address]

## 2011-09-25 NOTE — Patient Instructions (Signed)
See primary MD in [place] this week please!

## 2011-09-25 NOTE — Progress Notes (Signed)
Patient ID: Mary Valencia, female   DOB: 12/08/1955, 56 y.o.   MRN: [id number]  Subjective:    Patient ID: Mary Valencia, female    DOB: Sep 17, 1955, 56 y.o.   MRN: [id number]  HPI  F/u hosp adm last Fri for CP,  blurred vision and CBG 568 and BP 201/98. MI was ruled out.She had a card consult there inpatient. She declined a stress test there.  She lives in [place], [place] 2 h away   BP Readings from Last 3 Encounters:  09/25/11 132/74  01/18/11 140/88  10/05/10 148/80   Wt Readings from Last 3 Encounters:  09/25/11 132 lb 8 oz (60.102 kg)  01/18/11 149 lb (67.586 kg)  10/05/10 152 lb (68.947 kg)       Review of Systems  Constitutional: Negative for chills, activity change, appetite change, fatigue and unexpected weight change.  HENT: Negative for congestion, mouth sores and sinus pressure.   Eyes: Negative for visual disturbance.  Respiratory: Negative for cough and chest tightness.   Gastrointestinal: Negative for nausea and abdominal pain.  Genitourinary: Negative for frequency, difficulty urinating and vaginal pain.  Musculoskeletal: Negative for back pain and gait problem.  Skin: Negative for pallor and rash.  Neurological: Negative for dizziness, tremors, weakness, numbness and headaches.  Psychiatric/Behavioral: Negative for confusion and sleep disturbance.       Objective:   Physical Exam  Constitutional: She appears well-developed. No distress.       obese  HENT:  Head: Normocephalic.  Right Ear: External ear normal.  Left Ear: External ear normal.  Nose: Nose normal.  Mouth/Throat: Oropharynx is clear and moist.  Eyes: Conjunctivae are normal. Pupils are equal, round, and reactive to light. Right eye exhibits no discharge. Left eye exhibits no discharge.  Neck: Normal range of motion. Neck supple. No JVD present. No tracheal deviation present. No thyromegaly present.  Cardiovascular: Normal rate, regular rhythm and normal heart sounds.   Pulmonary/Chest:  No stridor. No respiratory distress. She has no wheezes.  Abdominal: Soft. Bowel sounds are normal. She exhibits no distension and no mass. There is no tenderness. There is no rebound and no guarding.  Genitourinary: Guaiac negative stool. No vaginal discharge found.  Musculoskeletal: She exhibits no edema and no tenderness.  Lymphadenopathy:    She has no cervical adenopathy.  Neurological: She displays normal reflexes. No cranial nerve deficit. She exhibits normal muscle tone. Coordination normal.  Skin: No rash noted. No erythema.  Psychiatric: She has a normal mood and affect. Her behavior is normal. Judgment and thought content normal.   Lab Results  Component Value Date   WBC 8.7 09/05/2010   HGB 13.6 09/05/2010   HCT 40.0 09/05/2010   PLT 260.0 09/05/2010   GLUCOSE 348* 09/05/2010   CHOL 289* 09/29/2010   TRIG 91.0 09/29/2010   HDL 62.80 09/29/2010   LDLDIRECT 207.8 09/29/2010   ALT 10 09/29/2010   AST 13 09/29/2010   NA 138 09/05/2010   K 3.9 09/05/2010   CL 95* 09/05/2010   CREATININE 1.1 09/05/2010   BUN 27* 09/05/2010   CO2 29 09/05/2010   TSH 1.19 09/05/2010   HGBA1C 7.9* 12/11/2008          Assessment & Plan:

## 2011-09-26 ENCOUNTER — Encounter: Payer: Self-pay | Admitting: Internal Medicine

## 2011-09-26 NOTE — Assessment & Plan Note (Addendum)
Start Lantus Given novolog here She will see a new PCP in [place] this week Glucometer given

## 2011-09-26 NOTE — Assessment & Plan Note (Signed)
Continue with current prescription therapy as reflected on the Med list.  

## 2011-09-26 NOTE — Assessment & Plan Note (Signed)
She was d/c'd after r/o MI and cardiol consult. She will see a cardiologist this week. Cont ASA 325 mg/d

## 2011-09-29 ENCOUNTER — Telehealth: Payer: Self-pay | Admitting: *Deleted

## 2011-09-29 NOTE — Telephone Encounter (Signed)
Patient would like to know if she can increase her Insulin  From [5] units to [7] units to manage CBG better. Please advise.

## 2011-09-29 NOTE — Telephone Encounter (Signed)
Yes. Thx.

## 2011-10-02 NOTE — Telephone Encounter (Signed)
Informed patient

## 2011-10-04 ENCOUNTER — Other Ambulatory Visit: Payer: Self-pay | Admitting: Internal Medicine

## 2011-12-28 ENCOUNTER — Emergency Department (HOSPITAL_BASED_OUTPATIENT_CLINIC_OR_DEPARTMENT_OTHER)
Admission: EM | Admit: 2011-12-28 | Discharge: 2011-12-28 | Disposition: A | Discharge status: Home / Self Care | Payer: Self-pay | Attending: Emergency Medicine | Admitting: Emergency Medicine

## 2011-12-28 ENCOUNTER — Encounter (HOSPITAL_BASED_OUTPATIENT_CLINIC_OR_DEPARTMENT_OTHER): Payer: Self-pay | Admitting: *Deleted

## 2011-12-28 DIAGNOSIS — R5383 Other fatigue: Secondary | ICD-10-CM

## 2011-12-28 DIAGNOSIS — R531 Weakness: Secondary | ICD-10-CM

## 2011-12-28 DIAGNOSIS — E785 Hyperlipidemia, unspecified: Secondary | ICD-10-CM | POA: Insufficient documentation

## 2011-12-28 DIAGNOSIS — R5381 Other malaise: Secondary | ICD-10-CM | POA: Insufficient documentation

## 2011-12-28 DIAGNOSIS — Z794 Long term (current) use of insulin: Secondary | ICD-10-CM | POA: Insufficient documentation

## 2011-12-28 DIAGNOSIS — I10 Essential (primary) hypertension: Secondary | ICD-10-CM | POA: Insufficient documentation

## 2011-12-28 DIAGNOSIS — E119 Type 2 diabetes mellitus without complications: Secondary | ICD-10-CM | POA: Insufficient documentation

## 2011-12-28 DIAGNOSIS — K219 Gastro-esophageal reflux disease without esophagitis: Secondary | ICD-10-CM | POA: Insufficient documentation

## 2011-12-28 DIAGNOSIS — Z79899 Other long term (current) drug therapy: Secondary | ICD-10-CM | POA: Insufficient documentation

## 2011-12-28 LAB — URINALYSIS, ROUTINE W REFLEX MICROSCOPIC
Glucose, UA: 250 mg/dL — AB
Hgb urine dipstick: NEGATIVE
Protein, ur: NEGATIVE mg/dL
Specific Gravity, Urine: 1.02 (ref 1.005–1.030)
pH: 5.5 (ref 5.0–8.0)

## 2011-12-28 LAB — CBC WITH DIFFERENTIAL/PLATELET
Basophils Absolute: 0 10*3/uL (ref 0.0–0.1)
Eosinophils Absolute: 0 10*3/uL (ref 0.0–0.7)
Eosinophils Relative: 0 % (ref 0–5)
Lymphocytes Relative: 29 % (ref 12–46)
MCH: 28.1 pg (ref 26.0–34.0)
MCV: 81 fL (ref 78.0–100.0)
Neutrophils Relative %: 65 % (ref 43–77)
Platelets: 338 10*3/uL (ref 150–400)
RDW: 12 % (ref 11.5–15.5)
WBC: 7 10*3/uL (ref 4.0–10.5)

## 2011-12-28 LAB — COMPREHENSIVE METABOLIC PANEL
ALT: 6 U/L (ref 0–35)
AST: 10 U/L (ref 0–37)
Calcium: 9.8 mg/dL (ref 8.4–10.5)
Potassium: 4 mEq/L (ref 3.5–5.1)
Sodium: 139 mEq/L (ref 135–145)
Total Protein: 7.1 g/dL (ref 6.0–8.3)

## 2011-12-28 LAB — URINE MICROSCOPIC-ADD ON

## 2011-12-28 MED ORDER — SODIUM CHLORIDE 0.9 % IV BOLUS (SEPSIS)
1000.0000 mL | Freq: Once | INTRAVENOUS | Status: AC
  Administered 2011-12-28: 1000 mL via INTRAVENOUS

## 2011-12-28 NOTE — ED Notes (Signed)
States she is concerned that she is loosing weight. Has had a 7 pound wt loss over the past month.

## 2011-12-28 NOTE — ED Notes (Signed)
Weakness x 2 days. States she was admitted to the hospital in [place] where she lived a couple of weeks ago for renal insufficiency. She just moved here to be closer to her family.

## 2011-12-28 NOTE — ED Provider Notes (Signed)
History     CSN: [id number]  Arrival date & time 12/28/11  1503   First MD Initiated Contact with Patient 12/28/11 1525      Chief Complaint  Patient presents with  . Weakness    (Consider location/radiation/quality/duration/timing/severity/associated sxs/prior treatment) HPI  H/o IDDM, HTN, HLD pw weakness x 2 days. Diffuse. +fatigue. Denies CP/SOB/F/c/n/v/abdominal pain. Denies hematuria/dysuria/freq/urgency.  States fasting glucose has been in the 140s. Denies headache, dizziness. C/O chronic hip pain- at baseline. Dec appetite. Eating less. States that she's losing weight. She is monitoring carbohydrates. Has lost 6-7lbs since June 18th but states she's been losing weight since Feb 127lbs.  Was at OSH 6/18 for dehydration and ARI Cr 1.6. 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 Type II or unspecified type diabetes mellitus without mention of complication, not stated as uncontrolled   . GERD (gastroesophageal reflux disease)   . Hyperlipidemia   . HTN (hypertension)   . Allergic rhinitis     Past Surgical History  Procedure Date  . Nephrectomy   . Abdominal hysterectomy     partial    Family History  Problem Relation Age of Onset  . Diabetes Other   . Heart disease Mother 28    MI  . Heart disease Father 36    MI  . Alcohol abuse Sister   . Kidney disease Sister   . Heart disease Brother     ?CHF ?CAD  . Heart disease Maternal Aunt     History  Substance Use Topics  . Smoking status: Never Smoker   . Smokeless tobacco: Not on file  . Alcohol Use: No    OB History    Grav Para Term Preterm Abortions TAB SAB Ect Mult Living                  Review of Systems  All other systems reviewed and are negative.  except as noted HPI   Allergies  Betadine; Iodine; Penicillins; Sulfonamide derivatives; Tetracyclines & related; and Naproxen  Home Medications   Current  Outpatient Rx  Name Route Sig Dispense Refill  . ASPIRIN 81 MG PO TABS Oral Take 81 mg by mouth daily.    . SUPER B COMPLEX PO TABS Oral Take 1 tablet by mouth daily.    Marland Kitchen BENAZEPRIL HCL 40 MG PO TABS Oral Take 40 mg by mouth daily.      Marland Kitchen VITAMIN D-3 5000 UNITS PO TABS Oral Take 1 tablet by mouth daily.    . INSULIN GLARGINE 100 UNIT/ML [place] SOLN Subcutaneous Inject 10 Units into the skin daily.    Marland Kitchen KRILL OIL PO Oral Take 1 capsule by mouth daily.    Marland Kitchen LINAGLIPTIN-METFORMIN HCL 2.5-500 MG PO TABS Oral Take 1 tablet by mouth 2 (two) times daily. 60 tablet 11  . METOPROLOL TARTRATE 50 MG PO TABS Oral Take 50 mg by mouth 2 (two) times daily.    Marland Kitchen POTASSIUM CHLORIDE CRYS ER 20 MEQ PO TBCR Oral Take 40 mEq by mouth every other day.       BP 144/83  Pulse 92  Temp 98 F (36.7 C) (Oral)  Resp 20  SpO2 100%  Physical Exam  Nursing note and vitals reviewed. Constitutional: She is oriented to person, place, and time. She appears well-developed.  HENT:  Head: Atraumatic.  Mouth/Throat: Oropharynx is clear and moist.  Eyes: Conjunctivae and EOM are normal. Pupils are  equal, round, and reactive to light.  Neck: Normal range of motion. Neck supple.  Cardiovascular: Normal rate, regular rhythm, normal heart sounds and intact distal pulses.   Pulmonary/Chest: Effort normal and breath sounds normal. No respiratory distress. She has no wheezes. She has no rales. She exhibits no tenderness.  Abdominal: Soft. She exhibits no distension. There is no tenderness. There is no rebound and no guarding.  Musculoskeletal: Normal range of motion. She exhibits no edema and no tenderness.  Neurological: She is alert and oriented to person, place, and time. No cranial nerve deficit. She exhibits normal muscle tone. Coordination normal.  Skin: Skin is warm and dry. No rash noted.  Psychiatric: She has a normal mood and affect.    Date: 12/30/2011  Rate: 82  Rhythm: normal sinus rhythm  QRS Axis: normal   Intervals: normal  ST/T Wave abnormalities: normal  Conduction Disutrbances:none  Narrative Interpretation:   Old EKG Reviewed: none available   ED Course  Procedures (including critical care time)  Labs Reviewed  URINALYSIS, ROUTINE W REFLEX MICROSCOPIC - Abnormal; Notable for the following:    Glucose, UA 250 (*)     Bilirubin Urine MODERATE (*)     Ketones, ur 40 (*)     Leukocytes, UA SMALL (*)     All other components within normal limits  URINE MICROSCOPIC-ADD ON - Abnormal; Notable for the following:    Squamous Epithelial / LPF FEW (*)     All other components within normal limits  CBC WITH DIFFERENTIAL - Abnormal; Notable for the following:    HCT 35.8 (*)     All other components within normal limits  COMPREHENSIVE METABOLIC PANEL - Abnormal; Notable for the following:    Glucose, Bld 201 (*)     Total Bilirubin 0.2 (*)     GFR calc non Af Amer 61 (*)     GFR calc Af Amer 71 (*)     All other components within normal limits  GLUCOSE, CAPILLARY - Abnormal; Notable for the following:    Glucose-Capillary 211 (*)     All other components within normal limits  TROPONIN I   No results found.   1. Weakness generalized   2. Fatigue     MDM  Fatigue and gen weakness x 2 days, similar to previous. Mild dehydration. Given IVF in ED. Her Cr has improved from previous admission. Her history and physical are nonfocal. Doubt ACS. Clinically, appears well. Ambulatory and feeling better in ED. Contaminated urine sample without nitrites. No EMC precluding discharge at this time. Given Precautions for return. PMD f/u.         Blair Heys, MD 12/30/11 [phone]

## 2012-01-19 ENCOUNTER — Other Ambulatory Visit: Payer: Self-pay | Admitting: Orthopaedic Surgery

## 2012-01-19 DIAGNOSIS — M5416 Radiculopathy, lumbar region: Secondary | ICD-10-CM

## 2012-01-23 ENCOUNTER — Ambulatory Visit (HOSPITAL_COMMUNITY)
Admission: RE | Admit: 2012-01-23 | Discharge: 2012-01-23 | Disposition: A | Discharge status: Home / Self Care | Payer: Self-pay | Source: Ambulatory Visit | Attending: Orthopaedic Surgery | Admitting: Orthopaedic Surgery

## 2012-01-23 DIAGNOSIS — M545 Low back pain, unspecified: Secondary | ICD-10-CM | POA: Insufficient documentation

## 2012-01-23 DIAGNOSIS — M25559 Pain in unspecified hip: Secondary | ICD-10-CM | POA: Insufficient documentation

## 2012-01-23 DIAGNOSIS — D1809 Hemangioma of other sites: Secondary | ICD-10-CM | POA: Insufficient documentation

## 2012-01-23 DIAGNOSIS — M5416 Radiculopathy, lumbar region: Secondary | ICD-10-CM

## 2012-01-24 ENCOUNTER — Other Ambulatory Visit (INDEPENDENT_AMBULATORY_CARE_PROVIDER_SITE_OTHER): Payer: Self-pay

## 2012-01-24 ENCOUNTER — Ambulatory Visit (INDEPENDENT_AMBULATORY_CARE_PROVIDER_SITE_OTHER): Payer: Self-pay | Admitting: Internal Medicine

## 2012-01-24 ENCOUNTER — Encounter: Payer: Self-pay | Admitting: Internal Medicine

## 2012-01-24 VITALS — BP 150/88 | HR 80 | Temp 98.1°F | Resp 16 | Wt 106.0 lb

## 2012-01-24 DIAGNOSIS — M545 Low back pain, unspecified: Secondary | ICD-10-CM | POA: Insufficient documentation

## 2012-01-24 DIAGNOSIS — E119 Type 2 diabetes mellitus without complications: Secondary | ICD-10-CM

## 2012-01-24 DIAGNOSIS — R634 Abnormal weight loss: Secondary | ICD-10-CM | POA: Insufficient documentation

## 2012-01-24 DIAGNOSIS — I10 Essential (primary) hypertension: Secondary | ICD-10-CM

## 2012-01-24 DIAGNOSIS — E785 Hyperlipidemia, unspecified: Secondary | ICD-10-CM

## 2012-01-24 LAB — CBC WITH DIFFERENTIAL/PLATELET
Basophils Absolute: 0 10*3/uL (ref 0.0–0.1)
Eosinophils Absolute: 0 10*3/uL (ref 0.0–0.7)
HCT: 37.2 % (ref 36.0–46.0)
Lymphs Abs: 2.1 10*3/uL (ref 0.7–4.0)
MCHC: 33 g/dL (ref 30.0–36.0)
MCV: 85.7 fl (ref 78.0–100.0)
Monocytes Absolute: 0.5 10*3/uL (ref 0.1–1.0)
Monocytes Relative: 6.8 % (ref 3.0–12.0)
Platelets: 312 10*3/uL (ref 150.0–400.0)
RDW: 12.7 % (ref 11.5–14.6)

## 2012-01-24 LAB — URINALYSIS
Bilirubin Urine: NEGATIVE
Hgb urine dipstick: NEGATIVE
Nitrite: NEGATIVE
Total Protein, Urine: NEGATIVE

## 2012-01-24 LAB — HEPATIC FUNCTION PANEL
Albumin: 4.2 g/dL (ref 3.5–5.2)
Alkaline Phosphatase: 47 U/L (ref 39–117)
Total Protein: 7.2 g/dL (ref 6.0–8.3)

## 2012-01-24 LAB — SEDIMENTATION RATE: Sed Rate: 35 mm/hr — ABNORMAL HIGH (ref 0–22)

## 2012-01-24 LAB — HEMOGLOBIN A1C: Hgb A1c MFr Bld: 8.1 % — ABNORMAL HIGH (ref 4.6–6.5)

## 2012-01-24 LAB — BASIC METABOLIC PANEL
BUN: 17 mg/dL (ref 6–23)
CO2: 32 mEq/L (ref 19–32)
Calcium: 9.6 mg/dL (ref 8.4–10.5)
Creatinine, Ser: 1 mg/dL (ref 0.4–1.2)
GFR: 75.06 mL/min (ref >60.00)
Glucose, Bld: 279 mg/dL — ABNORMAL HIGH (ref 70–99)
Sodium: 139 mEq/L (ref 135–145)

## 2012-01-24 LAB — TSH: TSH: 2.2 u[IU]/mL (ref 0.35–5.50)

## 2012-01-24 MED ORDER — GABAPENTIN 100 MG PO CAPS
100.0000 mg | ORAL_CAPSULE | Freq: Three times a day (TID) | ORAL | Status: DC
Start: 2012-01-24 — End: 2012-01-29

## 2012-01-24 NOTE — Assessment & Plan Note (Signed)
7/13 01/19/12 MRI -- IMPRESSION:  1. 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  2. Patchy marrow signal but no worrisome bone lesions or fracture.  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  Dr Ninfa Linden said it is not her hip, she had xrays

## 2012-01-24 NOTE — Assessment & Plan Note (Signed)
Continue with current prescription therapy as reflected on the Med list.  

## 2012-01-24 NOTE — Patient Instructions (Addendum)
[email or web address]  BP Readings from Last 3 Encounters:  01/24/12 150/88  12/28/11 144/83  09/25/11 132/74   Wt Readings from Last 3 Encounters:  01/24/12 106 lb (48.081 kg)  09/25/11 132 lb 8 oz (60.102 kg)  01/18/11 149 lb (67.586 kg)    [email or web address]    --- sciatica exercises

## 2012-01-24 NOTE — Progress Notes (Signed)
   Subjective:    Patient ID: Mary Valencia, female    DOB: 09/09/1955, 56 y.o.   MRN: [id number]  Back Pain Pertinent negatives include no abdominal pain, headaches, numbness or weakness.  Hip Pain  Pertinent negatives include no numbness.    F/u hosp adm for LBP, L hip pain, CP. She had an MRI. She had a card consult there inpatient before for CP. She declined a stress test before. She lost wt. LBP is worse. She saw Ortho, had a chiropr treatments. Naproxen did not help. Dr Ninfa Linden said it was not her hip causing pain...   She lives in [place] now, back from [place], [place].   BP Readings from Last 3 Encounters:  01/24/12 150/88  12/28/11 144/83  09/25/11 132/74   Wt Readings from Last 3 Encounters:  01/24/12 106 lb (48.081 kg)  09/25/11 132 lb 8 oz (60.102 kg)  01/18/11 149 lb (67.586 kg)       Review of Systems  Constitutional: Negative for chills, activity change, appetite change, fatigue and unexpected weight change.  HENT: Negative for congestion, mouth sores and sinus pressure.   Eyes: Negative for visual disturbance.  Respiratory: Negative for cough and chest tightness.   Gastrointestinal: Negative for nausea and abdominal pain.  Genitourinary: Negative for frequency, difficulty urinating and vaginal pain.  Musculoskeletal: Positive for back pain. Negative for gait problem.  Skin: Negative for pallor and rash.  Neurological: Negative for dizziness, tremors, weakness, numbness and headaches.  Psychiatric/Behavioral: Negative for confusion and disturbed wake/sleep cycle.       Objective:   Physical Exam  Constitutional: She appears well-developed. No distress.       obese  HENT:  Head: Normocephalic.  Right Ear: External ear normal.  Left Ear: External ear normal.  Nose: Nose normal.  Mouth/Throat: Oropharynx is clear and moist.  Eyes: Conjunctivae are normal. Pupils are equal, round, and reactive to light. Right eye exhibits no discharge. Left eye exhibits no  discharge.  Neck: Normal range of motion. Neck supple. No JVD present. No tracheal deviation present. No thyromegaly present.  Cardiovascular: Normal rate, regular rhythm and normal heart sounds.   Pulmonary/Chest: No stridor. No respiratory distress. She has no wheezes.  Abdominal: Soft. Bowel sounds are normal. She exhibits no distension and no mass. There is no tenderness. There is no rebound and no guarding.  Genitourinary: Guaiac negative stool. No vaginal discharge found.  Musculoskeletal: She exhibits no edema and no tenderness.  Lymphadenopathy:    She has no cervical adenopathy.  Neurological: She displays normal reflexes. No cranial nerve deficit. She exhibits normal muscle tone. Coordination normal.  Skin: No rash noted. No erythema.  Psychiatric: She has a normal mood and affect. Her behavior is normal. Judgment and thought content normal.   Lab Results  Component Value Date   WBC 7.0 12/28/2011   HGB 12.4 12/28/2011   HCT 35.8* 12/28/2011   PLT 338 12/28/2011   GLUCOSE 201* 12/28/2011   CHOL 289* 09/29/2010   TRIG 91.0 09/29/2010   HDL 62.80 09/29/2010   LDLDIRECT 207.8 09/29/2010   ALT 6 12/28/2011   AST 10 12/28/2011   NA 139 12/28/2011   K 4.0 12/28/2011   CL 98 12/28/2011   CREATININE 1.00 12/28/2011   BUN 17 12/28/2011   CO2 26 12/28/2011   TSH 1.19 09/05/2010   HGBA1C 7.9* 12/11/2008          Assessment & Plan:

## 2012-01-24 NOTE — Assessment & Plan Note (Signed)
Better now Continue with current prescription therapy as reflected on the Med list.  Wt Readings from Last 3 Encounters:  01/24/12 106 lb (48.081 kg)  09/25/11 132 lb 8 oz (60.102 kg)  01/18/11 149 lb (67.586 kg)

## 2012-01-24 NOTE — Assessment & Plan Note (Signed)
Goal LDL<100

## 2012-01-25 ENCOUNTER — Telehealth: Payer: Self-pay | Admitting: Internal Medicine

## 2012-01-25 DIAGNOSIS — I639 Cerebral infarction, unspecified: Secondary | ICD-10-CM | POA: Insufficient documentation

## 2012-01-25 NOTE — Telephone Encounter (Signed)
Mary Valencia, please, inform patient that all labs are ok except for elev glu Thx

## 2012-01-25 NOTE — Telephone Encounter (Signed)
Pt informed

## 2012-01-26 ENCOUNTER — Telehealth: Payer: Self-pay | Admitting: *Deleted

## 2012-01-26 NOTE — Telephone Encounter (Signed)
jpatient called stating her BP was 219/110. With headache. Some blurred vision. No swelling in hands and feet. VO per Dr. Alain Marion to increase metoprolol to 100mg   2 x day. Patient understands directions and will go to E.R. If no better or any other symptoms

## 2012-01-28 ENCOUNTER — Emergency Department (HOSPITAL_BASED_OUTPATIENT_CLINIC_OR_DEPARTMENT_OTHER)
Admission: EM | Admit: 2012-01-28 | Discharge: 2012-01-28 | Disposition: A | Discharge status: Home / Self Care | Payer: Self-pay | Attending: Emergency Medicine | Admitting: Emergency Medicine

## 2012-01-28 ENCOUNTER — Emergency Department (HOSPITAL_BASED_OUTPATIENT_CLINIC_OR_DEPARTMENT_OTHER): Payer: Self-pay

## 2012-01-28 ENCOUNTER — Encounter (HOSPITAL_BASED_OUTPATIENT_CLINIC_OR_DEPARTMENT_OTHER): Payer: Self-pay | Admitting: *Deleted

## 2012-01-28 DIAGNOSIS — K219 Gastro-esophageal reflux disease without esophagitis: Secondary | ICD-10-CM | POA: Insufficient documentation

## 2012-01-28 DIAGNOSIS — I160 Hypertensive urgency: Secondary | ICD-10-CM

## 2012-01-28 DIAGNOSIS — Z794 Long term (current) use of insulin: Secondary | ICD-10-CM | POA: Insufficient documentation

## 2012-01-28 DIAGNOSIS — Z79899 Other long term (current) drug therapy: Secondary | ICD-10-CM | POA: Insufficient documentation

## 2012-01-28 DIAGNOSIS — R51 Headache: Secondary | ICD-10-CM | POA: Insufficient documentation

## 2012-01-28 DIAGNOSIS — E119 Type 2 diabetes mellitus without complications: Secondary | ICD-10-CM | POA: Insufficient documentation

## 2012-01-28 DIAGNOSIS — E785 Hyperlipidemia, unspecified: Secondary | ICD-10-CM | POA: Insufficient documentation

## 2012-01-28 DIAGNOSIS — I10 Essential (primary) hypertension: Secondary | ICD-10-CM | POA: Insufficient documentation

## 2012-01-28 LAB — COMPREHENSIVE METABOLIC PANEL
AST: 17 U/L (ref 0–37)
Albumin: 4.4 g/dL (ref 3.5–5.2)
Calcium: 10.3 mg/dL (ref 8.4–10.5)
Chloride: 98 mEq/L (ref 96–112)
Creatinine, Ser: 0.9 mg/dL (ref 0.50–1.10)
Total Protein: 8 g/dL (ref 6.0–8.3)

## 2012-01-28 LAB — CBC WITH DIFFERENTIAL/PLATELET
Basophils Absolute: 0 K/uL (ref 0.0–0.1)
Basophils Relative: 0 % (ref 0–1)
Eosinophils Absolute: 0.4 K/uL (ref 0.0–0.7)
Eosinophils Relative: 4 % (ref 0–5)
HCT: 39 % (ref 36.0–46.0)
Hemoglobin: 13.4 g/dL (ref 12.0–15.0)
Lymphocytes Relative: 16 % (ref 12–46)
Lymphs Abs: 1.6 K/uL (ref 0.7–4.0)
MCH: 28 pg (ref 26.0–34.0)
MCHC: 34.4 g/dL (ref 30.0–36.0)
MCV: 81.4 fL (ref 78.0–100.0)
Monocytes Absolute: 0.5 K/uL (ref 0.1–1.0)
Monocytes Relative: 5 % (ref 3–12)
Neutro Abs: 7.2 K/uL (ref 1.7–7.7)
Neutrophils Relative %: 74 % (ref 43–77)
Platelets: ADEQUATE K/uL (ref 150–400)
RBC: 4.79 MIL/uL (ref 3.87–5.11)
RDW: 11.6 % (ref 11.5–15.5)
WBC: 9.7 K/uL (ref 4.0–10.5)

## 2012-01-28 MED ORDER — ONDANSETRON HCL 4 MG/2ML IJ SOLN
4.0000 mg | Freq: Once | INTRAMUSCULAR | Status: AC
  Administered 2012-01-28: 4 mg via INTRAVENOUS
  Filled 2012-01-28: qty 2

## 2012-01-28 MED ORDER — CLONIDINE HCL 0.2 MG PO TABS: 0.1000 mg | ORAL_TABLET | Freq: Two times a day (BID) | ORAL | Status: DC

## 2012-01-28 MED ORDER — MORPHINE SULFATE 4 MG/ML IJ SOLN
4.0000 mg | Freq: Once | INTRAMUSCULAR | Status: AC
  Administered 2012-01-28: 4 mg via INTRAVENOUS
  Filled 2012-01-28: qty 1

## 2012-01-28 MED ORDER — CLONIDINE HCL 0.1 MG PO TABS
0.2000 mg | ORAL_TABLET | Freq: Once | ORAL | Status: AC
  Administered 2012-01-28: 0.2 mg via ORAL
  Filled 2012-01-28: qty 2

## 2012-01-28 NOTE — ED Provider Notes (Signed)
History     CSN: [id number]  Arrival date & time 01/28/12  0705   First MD Initiated Contact with Patient 01/28/12 [phone]      Chief Complaint  Patient presents with  . Headache    (Consider location/radiation/quality/duration/timing/severity/associated sxs/prior treatment) HPI Comments: Patient presents with headache since 4AM.  Had similar headache 2 days ago and was told by pcp to increase her dose of metoprolol which seemed to help.  The symptoms returned worse this morning.    Patient is a 56 y.o. female presenting with headaches. The history is provided by the patient.  Headache  This is a new problem. The current episode started 2 days ago. The problem has been rapidly worsening. The headache is associated with an unknown factor. The pain is located in the frontal region. The quality of the pain is described as throbbing. The pain is moderate. The pain does not radiate. Associated symptoms include nausea. Pertinent negatives include no palpitations and no shortness of breath. Treatments tried: increase dose of bp med. The treatment provided mild relief.    Past Medical History  Diagnosis Date  . Type II or unspecified type diabetes mellitus without mention of complication, not stated as uncontrolled   . GERD (gastroesophageal reflux disease)   . Hyperlipidemia   . HTN (hypertension)   . Allergic rhinitis     Past Surgical History  Procedure Date  . Nephrectomy   . Abdominal hysterectomy     partial  . Tonsillectomy   . Kidney surgery     right removed    Family History  Problem Relation Age of Onset  . Diabetes Other   . Heart disease Mother 7    MI  . Heart disease Father 55    MI  . Alcohol abuse Sister   . Kidney disease Sister   . Heart disease Brother     ?CHF ?CAD  . Heart disease Maternal Aunt     History  Substance Use Topics  . Smoking status: Never Smoker   . Smokeless tobacco: Not on file  . Alcohol Use: No    OB History    Grav Para Term  Preterm Abortions TAB SAB Ect Mult Living                  Review of Systems  Respiratory: Negative for shortness of breath.   Cardiovascular: Negative for chest pain and palpitations.  Gastrointestinal: Positive for nausea.  Neurological: Positive for headaches.  All other systems reviewed and are negative.    Allergies  Betadine; Iodine; Penicillins; Sulfonamide derivatives; Tetracyclines & related; and Naproxen  Home Medications   Current Outpatient Rx  Name Route Sig Dispense Refill  . ASPIRIN 81 MG PO TABS Oral Take 81 mg by mouth daily.    . SUPER B COMPLEX PO TABS Oral Take 1 tablet by mouth daily.    Marland Kitchen BENAZEPRIL HCL 40 MG PO TABS Oral Take 40 mg by mouth daily.      Marland Kitchen VITAMIN D-3 5000 UNITS PO TABS Oral Take 1 tablet by mouth daily.    Marland Kitchen GABAPENTIN 100 MG PO CAPS Oral Take 1 capsule (100 mg total) by mouth 3 (three) times daily. 90 capsule 3  . INSULIN GLARGINE 100 UNIT/ML [place] SOLN Subcutaneous Inject 10 Units into the skin daily.    Marland Kitchen KRILL OIL PO Oral Take 1 capsule by mouth daily.    Marland Kitchen LINAGLIPTIN-METFORMIN HCL 2.5-500 MG PO TABS Oral Take 1 tablet by mouth 2 (  two) times daily. 60 tablet 11  . METHOCARBAMOL 500 MG PO TABS Oral Take 500 mg by mouth every 6 (six) hours.    Marland Kitchen METOPROLOL TARTRATE 50 MG PO TABS Oral Take 100 mg by mouth 2 (two) times daily.     Marland Kitchen POTASSIUM CHLORIDE CRYS ER 20 MEQ PO TBCR Oral Take 40 mEq by mouth every other day.     . TRAMADOL HCL 50 MG PO TABS Oral Take 50 mg by mouth 2 (two) times daily as needed.      BP 229/102  Pulse 82  Temp 98.1 F (36.7 C)  Resp 20  SpO2 98%  Physical Exam  Nursing note and vitals reviewed. Constitutional: She is oriented to person, place, and time. She appears well-developed and well-nourished. No distress.  HENT:  Head: Normocephalic and atraumatic.  Eyes: EOM are normal. Pupils are equal, round, and reactive to light.       Np papilledema upon fundoscopic exam.  Neck: Normal range of motion. Neck  supple.  Cardiovascular: Normal rate and regular rhythm.  Exam reveals no gallop and no friction rub.   No murmur heard. Pulmonary/Chest: Effort normal and breath sounds normal. No respiratory distress. She has no wheezes.  Abdominal: Soft. Bowel sounds are normal. She exhibits no distension. There is no tenderness.  Musculoskeletal: Normal range of motion.  Neurological: She is alert and oriented to person, place, and time. No cranial nerve deficit. She exhibits normal muscle tone. Coordination normal.  Skin: Skin is warm and dry. She is not diaphoretic.    ED Course  Procedures (including critical care time)   Labs Reviewed  CBC WITH DIFFERENTIAL  COMPREHENSIVE METABOLIC PANEL  TROPONIN I   No results found.   No diagnosis found.   Date: 01/28/2012  Rate: 82  Rhythm: normal sinus rhythm  QRS Axis: normal  Intervals: normal  ST/T Wave abnormalities: normal  Conduction Disutrbances:none  Narrative Interpretation:   Old EKG Reviewed: unchanged    MDM  The patient presents with hypertension and headache but workup fails to reveal any evidence of end organ damage.  The ekg and troponin are normal, the head ct is unremarkable, and the kidney function is okay.  She was given clonidine for the blood pressure and has responded to it.  She is feeling better now and I believe is stable for discharge.  I will prescribe clonidine for her and she is to follow up with her pcp in one week for a recheck.  She will return if symptoms worsen.        Veryl Speak, MD 01/28/12 [phone]

## 2012-01-28 NOTE — ED Notes (Signed)
Patient c/o HA since 4am,. States that she had a HA X 2 days ago and primary MD instructed her to double BP med dosage and HA went away

## 2012-01-29 ENCOUNTER — Inpatient Hospital Stay (HOSPITAL_COMMUNITY): Payer: Self-pay

## 2012-01-29 ENCOUNTER — Inpatient Hospital Stay (HOSPITAL_BASED_OUTPATIENT_CLINIC_OR_DEPARTMENT_OTHER)
Admission: EM | Admit: 2012-01-29 | Discharge: 2012-01-29 | DRG: 071 | Disposition: A | Discharge status: Home / Self Care | Payer: MEDICAID | Attending: Family Medicine | Admitting: Family Medicine

## 2012-01-29 ENCOUNTER — Encounter (HOSPITAL_BASED_OUTPATIENT_CLINIC_OR_DEPARTMENT_OTHER): Payer: Self-pay | Admitting: *Deleted

## 2012-01-29 DIAGNOSIS — R634 Abnormal weight loss: Secondary | ICD-10-CM

## 2012-01-29 DIAGNOSIS — G8929 Other chronic pain: Secondary | ICD-10-CM | POA: Diagnosis present

## 2012-01-29 DIAGNOSIS — K5900 Constipation, unspecified: Secondary | ICD-10-CM | POA: Diagnosis present

## 2012-01-29 DIAGNOSIS — R0602 Shortness of breath: Secondary | ICD-10-CM

## 2012-01-29 DIAGNOSIS — R944 Abnormal results of kidney function studies: Secondary | ICD-10-CM | POA: Diagnosis present

## 2012-01-29 DIAGNOSIS — R000 Tachycardia, unspecified: Secondary | ICD-10-CM

## 2012-01-29 DIAGNOSIS — E118 Type 2 diabetes mellitus with unspecified complications: Secondary | ICD-10-CM | POA: Diagnosis present

## 2012-01-29 DIAGNOSIS — E119 Type 2 diabetes mellitus without complications: Secondary | ICD-10-CM | POA: Diagnosis present

## 2012-01-29 DIAGNOSIS — G9340 Encephalopathy, unspecified: Secondary | ICD-10-CM | POA: Diagnosis present

## 2012-01-29 DIAGNOSIS — M549 Dorsalgia, unspecified: Secondary | ICD-10-CM | POA: Diagnosis present

## 2012-01-29 DIAGNOSIS — I6350 Cerebral infarction due to unspecified occlusion or stenosis of unspecified cerebral artery: Secondary | ICD-10-CM

## 2012-01-29 DIAGNOSIS — R635 Abnormal weight gain: Secondary | ICD-10-CM

## 2012-01-29 DIAGNOSIS — Z7982 Long term (current) use of aspirin: Secondary | ICD-10-CM

## 2012-01-29 DIAGNOSIS — M545 Low back pain: Secondary | ICD-10-CM

## 2012-01-29 DIAGNOSIS — R0789 Other chest pain: Secondary | ICD-10-CM

## 2012-01-29 DIAGNOSIS — E785 Hyperlipidemia, unspecified: Secondary | ICD-10-CM | POA: Diagnosis present

## 2012-01-29 DIAGNOSIS — Z794 Long term (current) use of insulin: Secondary | ICD-10-CM

## 2012-01-29 DIAGNOSIS — J309 Allergic rhinitis, unspecified: Secondary | ICD-10-CM

## 2012-01-29 DIAGNOSIS — R0989 Other specified symptoms and signs involving the circulatory and respiratory systems: Secondary | ICD-10-CM

## 2012-01-29 DIAGNOSIS — T465X5A Adverse effect of other antihypertensive drugs, initial encounter: Secondary | ICD-10-CM | POA: Diagnosis present

## 2012-01-29 DIAGNOSIS — R4182 Altered mental status, unspecified: Secondary | ICD-10-CM

## 2012-01-29 DIAGNOSIS — K219 Gastro-esophageal reflux disease without esophagitis: Secondary | ICD-10-CM | POA: Diagnosis present

## 2012-01-29 DIAGNOSIS — I10 Essential (primary) hypertension: Secondary | ICD-10-CM | POA: Diagnosis present

## 2012-01-29 DIAGNOSIS — Z79899 Other long term (current) drug therapy: Secondary | ICD-10-CM

## 2012-01-29 DIAGNOSIS — N939 Abnormal uterine and vaginal bleeding, unspecified: Secondary | ICD-10-CM

## 2012-01-29 DIAGNOSIS — D649 Anemia, unspecified: Secondary | ICD-10-CM | POA: Diagnosis present

## 2012-01-29 LAB — GLUCOSE, CAPILLARY
Glucose-Capillary: 158 mg/dL — ABNORMAL HIGH (ref 70–99)
Glucose-Capillary: 82 mg/dL (ref 70–99)

## 2012-01-29 LAB — CBC
HCT: 31 % — ABNORMAL LOW (ref 36.0–46.0)
Hemoglobin: 10.5 g/dL — ABNORMAL LOW (ref 12.0–15.0)
MCH: 28.1 pg (ref 26.0–34.0)
MCHC: 33.8 g/dL (ref 30.0–36.0)
MCV: 82.7 fL (ref 78.0–100.0)
RBC: 3.75 MIL/uL — ABNORMAL LOW (ref 3.87–5.11)
RDW: 12 % (ref 11.5–15.5)
WBC: 9.3 10*3/uL (ref 4.0–10.5)

## 2012-01-29 LAB — COMPREHENSIVE METABOLIC PANEL
ALT: 6 U/L (ref 0–35)
AST: 9 U/L (ref 0–37)
Alkaline Phosphatase: 42 U/L (ref 39–117)
CO2: 30 mEq/L (ref 19–32)
Chloride: 102 mEq/L (ref 96–112)
GFR calc Af Amer: 61 mL/min — ABNORMAL LOW (ref >90)
GFR calc non Af Amer: 52 mL/min — ABNORMAL LOW (ref >90)
Glucose, Bld: 154 mg/dL — ABNORMAL HIGH (ref 70–99)
Potassium: 3.5 mEq/L (ref 3.5–5.1)
Sodium: 141 mEq/L (ref 135–145)

## 2012-01-29 LAB — TSH: TSH: 1.743 u[IU]/mL (ref 0.350–4.500)

## 2012-01-29 LAB — BASIC METABOLIC PANEL
BUN: 20 mg/dL (ref 6–23)
CO2: 30 mEq/L (ref 19–32)
Chloride: 100 mEq/L (ref 96–112)
Creatinine, Ser: 1.3 mg/dL — ABNORMAL HIGH (ref 0.50–1.10)

## 2012-01-29 LAB — HEMOGLOBIN A1C: Hgb A1c MFr Bld: 7.8 % — ABNORMAL HIGH (ref <5.7)

## 2012-01-29 LAB — CARDIAC PANEL(CRET KIN+CKTOT+MB+TROPI): Total CK: 31 U/L (ref 7–177)

## 2012-01-29 MED ORDER — METHOCARBAMOL 500 MG PO TABS
500.0000 mg | ORAL_TABLET | Freq: Four times a day (QID) | ORAL | Status: DC
  Administered 2012-01-29: 500 mg via ORAL
  Filled 2012-01-29 (×5): qty 1

## 2012-01-29 MED ORDER — SENNOSIDES-DOCUSATE SODIUM 8.6-50 MG PO TABS: 1.0000 | ORAL_TABLET | Freq: Every evening | ORAL | Status: DC | PRN

## 2012-01-29 MED ORDER — SENNOSIDES-DOCUSATE SODIUM 8.6-50 MG PO TABS: 1.0000 | ORAL_TABLET | Freq: Two times a day (BID) | ORAL | Status: AC | PRN

## 2012-01-29 MED ORDER — HYDRALAZINE HCL 20 MG/ML IJ SOLN: 10.0000 mg | INTRAMUSCULAR | Status: DC | PRN

## 2012-01-29 MED ORDER — SODIUM CHLORIDE 0.9 % IV SOLN: INTRAVENOUS | Status: DC

## 2012-01-29 MED ORDER — INSULIN ASPART 100 UNIT/ML ~~LOC~~ SOLN
0.0000 [IU] | Freq: Three times a day (TID) | SUBCUTANEOUS | Status: DC
  Administered 2012-01-29: 2 [IU] via SUBCUTANEOUS

## 2012-01-29 MED ORDER — SENNOSIDES-DOCUSATE SODIUM 8.6-50 MG PO TABS
1.0000 | ORAL_TABLET | Freq: Two times a day (BID) | ORAL | Status: DC
  Filled 2012-01-29: qty 1

## 2012-01-29 MED ORDER — TRAMADOL HCL 50 MG PO TABS
50.0000 mg | ORAL_TABLET | Freq: Two times a day (BID) | ORAL | Status: DC | PRN
  Administered 2012-01-29: 50 mg via ORAL
  Filled 2012-01-29: qty 1

## 2012-01-29 MED ORDER — BENAZEPRIL HCL 40 MG PO TABS
40.0000 mg | ORAL_TABLET | Freq: Every day | ORAL | Status: DC
  Administered 2012-01-29: 40 mg via ORAL
  Filled 2012-01-29: qty 1

## 2012-01-29 MED ORDER — INSULIN GLARGINE 100 UNIT/ML ~~LOC~~ SOLN: 10.0000 [IU] | Freq: Every day | SUBCUTANEOUS | Status: DC

## 2012-01-29 MED ORDER — GABAPENTIN 100 MG PO CAPS
100.0000 mg | ORAL_CAPSULE | Freq: Three times a day (TID) | ORAL | Status: DC
  Filled 2012-01-29 (×3): qty 1

## 2012-01-29 MED ORDER — METOPROLOL TARTRATE 100 MG PO TABS
100.0000 mg | ORAL_TABLET | Freq: Two times a day (BID) | ORAL | Status: DC
  Administered 2012-01-29: 100 mg via ORAL
  Filled 2012-01-29 (×2): qty 1

## 2012-01-29 NOTE — Progress Notes (Signed)
Occupational Therapy Discharge Patient Details Name: Mary Valencia MRN: [id number] DOB: [birth year] Today's Date: 01/29/2012 Time: [id number] OT Time Calculation (min): 37 min  Patient discharged from OT services secondary to goals met and no further OT needs identified.  Please see latest therapy progress note for current level of functioning and progress toward goals.    Progress and discharge plan discussed with patient and/or caregiver: Patient/Caregiver agrees with plan  GO     [name] 01/29/2012, 11:48 AM

## 2012-01-29 NOTE — Progress Notes (Signed)
PENDING ACCEPTANCE TRANFER NOTE:  Call received from:  Teressa Lower at [facility] REQUESTING TRANSFER:  Labile BP with altered mental status.  HPI:   Patient 56 yo saw earlier at [facility] with SBP over 200, given clonodine for which she took two pills and felt dizzy.  CT head was normal, and she was to be followed by TIA protocol, but EDP felt that admission to better control her BP would be prudent.   PLAN:  According to telephone report, this patient was accepted for transfer to Telemetry,   Under [facility] team:  [facility] 10,  I have requested an order be written to call Flow Manager at [phone] upon patient arrival to the floor for final physician assignment who will do the admission and give admitting orders.  SIGNEDOrvan Falconer, MD [facility]  01/29/2012, 2:11 AM

## 2012-01-29 NOTE — Evaluation (Signed)
Speech Language Pathology Evaluation Patient Details Name: Mary Valencia IDEN MRN: [id number] DOB: Mar 17, 1956 Today's Date: 01/29/2012 Time: [id number] SLP Time Calculation (min): 12 min  Problem List:  Patient Active Problem List  Diagnosis  . DIABETES MELLITUS, TYPE II  . HYPERLIPIDEMIA  . HYPERTENSION  . GERD  . WEIGHT GAIN  . ALLERGIC RHINITIS  . TACHYCARDIA  . DYSPNEA  . Vagina bleeding  . Chest pain, atypical  . Weight loss  . LBP (low back pain)  . Encephalopathy acute   Past Medical History:  Past Medical History  Diagnosis Date  . Type II or unspecified type diabetes mellitus without mention of complication, not stated as uncontrolled   . GERD (gastroesophageal reflux disease)   . Hyperlipidemia   . HTN (hypertension)   . Allergic rhinitis    Past Surgical History:  Past Surgical History  Procedure Date  . Nephrectomy     right - related to untreated strep infection and stones  . Abdominal hysterectomy     partial  . Tonsillectomy   . Kidney surgery     right removed   HPI:      Assessment / Plan / Recommendation Clinical Impression  Patient conversed normally with SLP without evidence for aphasia, dysarthria, or confusion.  Patient recognized a staff member who was a childhood friend, introducing her old friend to her husband, and recalling stories from childhood.  Sppech and language appear to be back to baseline.    SLP Assessment  Patient does not need any further Speech Lanaguage Pathology Services    Follow Up Recommendations  None    Frequency and Duration        Pertinent Vitals/Pain N/A   SLP Goals  SLP Goals Potential to Achieve Goals:  (N/A)  SLP Evaluation Prior Functioning  Cognitive/Linguistic Baseline: Within functional limits Type of Home: House Lives With: Spouse Available Help at Discharge: Family;Available 24 hours/day Vocation: Unemployed   Cognition  Overall Cognitive Status: Appears within functional limits for tasks  assessed Arousal/Alertness: Awake/alert Orientation Level: Oriented X4 Attention: Divided Divided Attention: Appears intact Memory: Appears intact Awareness: Appears intact Problem Solving: Appears intact Safety/Judgment: Appears intact    Comprehension  Auditory Comprehension Overall Auditory Comprehension: Appears within functional limits for tasks assessed    Expression Expression Primary Mode of Expression: Verbal Verbal Expression Overall Verbal Expression: Appears within functional limits for tasks assessed Initiation: No impairment Written Expression Dominant Hand: Left   Oral / Motor Oral Motor/Sensory Function Overall Oral Motor/Sensory Function: Appears within functional limits for tasks assessed        Quinn Axe T 01/29/2012, 10:35 AM

## 2012-01-29 NOTE — Discharge Summary (Signed)
Physician Discharge Summary  Patient ID: Mary Valencia MRN: [id number] DOB/AGE: 58-Jan-1957 58 y.o.  Admit date: 01/29/2012 Discharge date: 01/29/2012  Discharge Diagnoses:   *Encephalopathy acute - resolved now   DIABETES MELLITUS, TYPE II  HYPERLIPIDEMIA  HYPERTENSION  Constipation  Chronic Back Pain  Recommendations for provider on follow up appointment:  Please recheck BMP, CBC, blood pressure, evaluate metformin (pt had slightly elevated Cr 1.1)  Discharged Condition: good, stable  Hospital Course: From H&P:  56 year-old female with history of hypertension, hyperlipidemia, diabetes mellitus2 and previous history of right-sided nephrectomy due to infection presents to the ER with complaints of dizziness and confusion at the [place]. As per patient patient was experiencing right-sided parieto-occipital headache 2 days ago which was persistent. She had initially called her PCPs office and was told to double her metoprolol dose. Despite which patient still had headache and presented to the ER yesterday morning. Over there patient blood pressure was found to be very high and was prescribed clonidine and as per patient one dose was given in the ER and she was given a prescription to take home. She took a second dose late in the evening around 10 PM she became confused and was talking out of her mind and she was dizzy. The whole episode lasted for around 15 minutes. Her husband brought her to the ER. CT head done without contrast in the earlier visit was showing nonspecific changes concerning for lacunar infarcts. Patient has been admitted for further workup. Presently patient denies any headache any nausea vomiting (she did have nausea vomiting yesterday morning twice). Denies any weakness of upper or lower extremities. Denies chest pain shortness of breath. EKG shows sinus rhythm.  #1. Brief episode of confusion - acute encephalopathy/acute delirium which has essentially resolved -  most likely related to clonidine. Clonidine will be discontinued. Since patient had nonspecific findings on the CT head, pt was placed on neuro checks and MRI brain was tested and no acute findings reported (see full MRI/MRA reports).  No PT/OT follow up was recommended.  Patient did very well with them.    #2. Accelerated hypertension - blood pressure is controlled at this time. We will continue her home medications for now except discontinued clonidine. Follow up with PCP this week for recheck.  #3. Elevated creatinine - patient's creatinine is slightly elevated at 1.1. Pt received IVFs and recommend rechecking with PCP later this week on follow up.  Continue metformin for now.    #4. Anemia - Hg 10.5 after hydration.  Repeat Hg was 10.2.  Denies any blood in the vomitus or stools. No melena reported.   #5. Diabetes mellitus2 - resume home meds, follow up with PCP  #6. Constipation - home on senna/docusate 1 tab po BID prn constipation  Significant Diagnostic Studies: CT HEAD 1. 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 However, these are slightly  less well defined than is commonly seen, such that subacute lacunar  infarctions from an embolic source would be difficult to entirely  exclude. If there is clinical concern for an acute stroke, further  evaluation with brain MRI may be warranted.  MRI HEAD 1. No acute intracranial abnormality. No abnormal signal to  correspond to that questioned by CT.  2. Mild for age nonspecific white matter signal changes.  3. See MRA findings below. MRA HEAD IMPRESSION:  1.  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  2. Otherwise negative intracranial MRA allowing for some motion  artifact.  Carotid Dopplers: Preliminary report:  Bilateral: No evidence of hemodynamically significant internal carotid artery stenosis. Vertebral artery flow is antegrade.  Discharge Exam:PT says that she feels much better and she wants to go home.  She is alert and oriented and has been so since last night.  She says she will follow up with PCP as recommended.  Blood pressure 129/77, pulse 88, temperature 98.3 F (36.8 C), temperature source Oral, resp. rate 18, height 5\' 2"  (1.575 m), weight 44.589 kg (98 lb 4.8 oz), SpO2 100.00%. Constitutional: She is oriented to person, place, and time. She appears well-developed and well-nourished. No distress.  HENT:  Head: Normocephalic and atraumatic.  Nose: Nose normal.  Mouth/Throat: Oropharynx is clear and moist. No oropharyngeal exudate.  Eyes: Conjunctivae are normal. Pupils are equal, round, and reactive to light. Right eye exhibits no discharge. Left eye exhibits no discharge. No scleral icterus.  Neck: Normal range of motion. Neck supple.  Cardiovascular: Normal rate and regular rhythm.  Respiratory: Effort normal and breath sounds normal. No respiratory distress. She has no wheezes. She has no rales.  GI: Soft. Bowel sounds are normal. She exhibits no distension. There is no tenderness. There is no rebound.  Musculoskeletal: Normal range of motion. She exhibits no edema and no tenderness.  Neurological: She is alert and oriented to person, place, and time.  Moves all extremities.  Normal Gait.  Skin: Skin is warm and dry. She is not diaphoretic.  Disposition: 01-Home or Self Care  Discharge Orders    Future Appointments: Provider: Department: Dept Phone: Center:   02/08/2012 1:15 PM Cassandria Anger, MD [facility] [phone] [facility]     Future Orders Please Complete By Expires   Diet - low sodium heart healthy      Increase activity slowly      Discharge instructions      Comments:   Return or go to ER if symptoms recur, don't improve, worsen or new problems develop Check blood  pressure 2 times per day and report readings to your primary care physician Please see your primary care physician in 3-4 days for hospital follow up and recheck blood pressure, recheck BMP Stop taking clonidine and stop taking gabapentin     Medication List  As of 01/29/2012  3:32 PM   STOP taking these medications         cloNIDine 0.2 MG tablet      gabapentin 100 MG capsule         TAKE these medications         aspirin 81 MG tablet   Take 81 mg by mouth daily.      benazepril 40 MG tablet   Commonly known as: LOTENSIN   Take 40 mg by mouth daily.      KRILL OIL PO   Take 1 capsule by mouth daily.      LANTUS SOLOSTAR 100 UNIT/ML injection   Generic drug: insulin glargine   Inject 10 Units into the skin daily.      Linagliptin-Metformin HCl 2.5-500 MG Tabs   Take 1 tablet by mouth 2 (two) times daily.      methocarbamol 500 MG tablet   Commonly known as: ROBAXIN   Take 500 mg by mouth every 6 (six) hours.      metoprolol 50  MG tablet   Commonly known as: LOPRESSOR   Take 100 mg by mouth 2 (two) times daily.      potassium chloride SA 20 MEQ tablet   Commonly known as: K-DUR,KLOR-CON   Take 40 mEq by mouth every other day.      Super B Complex Tabs   Take 1 tablet by mouth daily.      traMADol 50 MG tablet   Commonly known as: ULTRAM   Take 50 mg by mouth 2 (two) times daily as needed.      Vitamin D-3 5000 UNITS Tabs   Take 1 tablet by mouth daily.           pericolace 1 tab by mouth twice daily as needed for constipation       Follow-up Information    Follow up with Walker Kehr, MD. Schedule an appointment as soon as possible for a visit in 3 days. [facility] follow up, recheck BP)    Contact information:   [address] [facility] [phone]         Signed: [facility]  [place], [place] 01/29/2012, 3:32 PM

## 2012-01-29 NOTE — Progress Notes (Signed)
Occupational Therapy Evaluation Patient Details Name: GLORIE CHESEBRO MRN: [id number] DOB: [birth year] Today's Date: 01/29/2012 Time: [id number] OT Time Calculation (min): 37 min  OT Assessment / Plan / Recommendation Clinical Impression  Pt is a 56 y/o female admitted with confusion and dizziness. Pt. has decreased balance due to pain from sciatica. OT spoke with pt. and spouse about balance deficits and the benefits of a cane/walker. Pt. was open to idea. She feels she is at baseline now for ADLs and balance. OT to sign off.      OT Assessment  Patient does not need any further OT services    Follow Up Recommendations  No OT follow up    Barriers to Discharge  None.    Equipment Recommendations  Cane    Recommendations for Other Services    Frequency       Precautions / Restrictions Precautions Precautions: Fall Restrictions Weight Bearing Restrictions: No   Pertinent Vitals/Pain Pain 4/10 in Lt. Hip.     ADL  Grooming: Performed;Teeth care;Wash/dry face;Min guard Where Assessed - Grooming: Supported standing Upper Body Bathing: Simulated;Set up Where Assessed - Upper Body Bathing: Unsupported sitting Lower Body Bathing: Simulated;Set up Where Assessed - Lower Body Bathing: Unsupported sitting Upper Body Dressing: Simulated;Set up Where Assessed - Upper Body Dressing: Unsupported sitting Lower Body Dressing: Performed;Set up Where Assessed - Lower Body Dressing: Unsupported sitting Toilet Transfer: Performed;Min guard Toilet Transfer Method: Sit to Loss adjuster, chartered: Regular height toilet Toileting - Clothing Manipulation and Hygiene: Performed;Min guard Where Assessed - Best boy and Hygiene: Standing Equipment Used: Gait belt;Rolling walker Transfers/Ambulation Related to ADLs: Min A for ambulation due to balance deficits ADL Comments: Pt. able to don socks while sitting EOB with Setup A. Pt. ambulated to toilet with Min A for  balance deficits. She performed toilet transfer with Min G assist. OT explained to pt. that a cane or walker would be beneficial for her safety when ambulating. Pt. explained to OT that she is open to the idea, but she feels that she is at baseline for ADLs and ambulation.      OT Goals    Visit Information  Last OT Received On: 01/29/12 Assistance Needed: +1    Subjective Data  Subjective: Pt. explained that she has had balance deficits for four months and feels she is currently at her baseline.   Prior Functioning  Vision/Perception  Home Living Lives With: Spouse Available Help at Discharge: Family;Available 24 hours/day Type of Home: House Home Access: Stairs to enter [employer] of Steps: 1 Entrance Stairs-Rails: None Home Layout: One level Bathroom Shower/Tub: Product/process development scientist: Standard Bathroom Accessibility: Yes How Accessible: Accessible via walker Home Adaptive Equipment: None Prior Function Level of Independence: Needs assistance Needs Assistance: Gait Gait Assistance: handheld assist Able to Take Stairs?: No Driving: Yes Vocation: Unemployed Communication Communication: No difficulties Dominant Hand: Left      Cognition  Overall Cognitive Status: Appears within functional limits for tasks assessed/performed Arousal/Alertness: Awake/alert Orientation Level: Oriented X4 / Intact Behavior During Session: [facility] for tasks performed    Extremity/Trunk Assessment Right Upper Extremity Assessment RUE ROM/Strength/Tone: Within functional levels Left Upper Extremity Assessment LUE ROM/Strength/Tone: Within functional levels Right Lower Extremity Assessment RLE ROM/Strength/Tone: Within functional levels RLE Sensation: WFL - Light Touch RLE Coordination: WFL - gross/fine motor Left Lower Extremity Assessment LLE ROM/Strength/Tone: Within functional levels LLE Sensation: WFL - Light Touch LLE Coordination: WFL - gross/fine  motor Trunk Assessment Trunk Assessment: Normal  Mobility Bed Mobility Bed Mobility: Not assessed Supine to Sit: 5: Supervision Sitting - Scoot to Edge of Bed: 5: Supervision Transfers Transfers: Sit to Stand;Stand to Sit Sit to Stand: 5: Supervision;With upper extremity assist;From chair/3-in-1 Stand to Sit: 5: Supervision;With upper extremity assist;To chair/3-in-1 Details for Transfer Assistance: Verbal cues for safety.         End of Session OT - End of Session Activity Tolerance: Patient tolerated treatment well Patient left: in chair;with call bell/phone within reach;with family/visitor present  GO     [name] 01/29/2012, 10:25 AM

## 2012-01-29 NOTE — Progress Notes (Signed)
I agree with the following treatment note after reviewing documentation.   [place], [place]   OTR/L Pager: [phone] Office: [phone] .   

## 2012-01-29 NOTE — Progress Notes (Signed)
  Echocardiogram 2D Echocardiogram has been performed.  Philipp Deputy 01/29/2012, 11:29 AM

## 2012-01-29 NOTE — ED Provider Notes (Signed)
History   This chart was scribed for Teressa Lower, MD by Roe Coombs. The patient was seen in room MH02/MH02. Patient's care was started at [place].     CSN: [id number]  Arrival date & time 01/29/12  0009   First MD Initiated Contact with Patient 01/29/12 0013      Chief Complaint  Patient presents with  . Altered Mental Status    (Consider location/radiation/quality/duration/timing/severity/associated sxs/prior treatment) The history is provided by the patient. No language interpreter was used.    Mary Valencia is a 56 y.o. female with a history of HTN who presents to the Emergency Department complaining of AMS onset approximately 2.5 hours ago. Patient was seen here 13 hours ago by Dr. Veryl Speak complaining of HTN and HA. Patient was given 1 tablet of Clonidine at that time. Patient took another Clonidine at home and began exhibiting symptoms. Patient's BP at that time was 169/95.  Patient's symptoms include slurred speech, difficulty walking, dizziness and confusion after taking Clonidine about 2.5 hours ago. Patient says that she also suffered a fall after she lost her balance. Patient states that this episode lasted about 15 minutes. Patient's symptoms are now resolved and patient states that she feels normal. Patient is ambulatory now. Patient has a 4-5 year history of HTN. Patient states that she has been hospitalized once for high blood pressure, but she also had elevated blood sugar levels of more than 500. Patient denies history of a stroke. Patient has no history of heart problems. Other medical history includes diabetes, GERD, and hyperlipidemia.   PCP - [facility]   Past Medical History  Diagnosis Date  . Type II or unspecified type diabetes mellitus without mention of complication, not stated as uncontrolled   . GERD (gastroesophageal reflux disease)   . Hyperlipidemia   . HTN (hypertension)   . Allergic rhinitis     Past Surgical History  Procedure Date  .  Nephrectomy   . Abdominal hysterectomy     partial  . Tonsillectomy   . Kidney surgery     right removed    Family History  Problem Relation Age of Onset  . Diabetes Other   . Heart disease Mother 43    MI  . Heart disease Father 22    MI  . Alcohol abuse Sister   . Kidney disease Sister   . Heart disease Brother     ?CHF ?CAD  . Heart disease Maternal Aunt     History  Substance Use Topics  . Smoking status: Never Smoker   . Smokeless tobacco: Not on file  . Alcohol Use: No    OB History    Grav Para Term Preterm Abortions TAB SAB Ect Mult Living                  Review of Systems  Neurological: Positive for dizziness and speech difficulty.  Psychiatric/Behavioral: Positive for confusion.  All other systems reviewed and are negative.    Allergies  Betadine; Iodine; Penicillins; Sulfonamide derivatives; Tetracyclines & related; and Naproxen  Home Medications   Current Outpatient Rx  Name Route Sig Dispense Refill  . ASPIRIN 81 MG PO TABS Oral Take 81 mg by mouth daily.    . SUPER B COMPLEX PO TABS Oral Take 1 tablet by mouth daily.    Marland Kitchen BENAZEPRIL HCL 40 MG PO TABS Oral Take 40 mg by mouth daily.      Marland Kitchen VITAMIN D-3 5000 UNITS PO TABS Oral Take 1  tablet by mouth daily.    Marland Kitchen CLONIDINE HCL 0.2 MG PO TABS Oral Take 0.5 tablets (0.1 mg total) by mouth 2 (two) times daily. 30 tablet 1  . GABAPENTIN 100 MG PO CAPS Oral Take 1 capsule (100 mg total) by mouth 3 (three) times daily. 90 capsule 3  . INSULIN GLARGINE 100 UNIT/ML [place] SOLN Subcutaneous Inject 10 Units into the skin daily.    Marland Kitchen KRILL OIL PO Oral Take 1 capsule by mouth daily.    Marland Kitchen LINAGLIPTIN-METFORMIN HCL 2.5-500 MG PO TABS Oral Take 1 tablet by mouth 2 (two) times daily. 60 tablet 11  . METHOCARBAMOL 500 MG PO TABS Oral Take 500 mg by mouth every 6 (six) hours.    Marland Kitchen METOPROLOL TARTRATE 50 MG PO TABS Oral Take 100 mg by mouth 2 (two) times daily.     Marland Kitchen POTASSIUM CHLORIDE CRYS ER 20 MEQ PO TBCR Oral Take  40 mEq by mouth every other day.     . TRAMADOL HCL 50 MG PO TABS Oral Take 50 mg by mouth 2 (two) times daily as needed.      BP 168/80  Pulse 71  Temp 97.9 F (36.6 C) (Oral)  Resp 18  Ht 5\' 2"  (1.575 m)  Wt 106 lb (48.081 kg)  BMI 19.39 kg/m2  SpO2 100%  Physical Exam  Nursing note and vitals reviewed. Constitutional: She is oriented to person, place, and time. She appears well-developed and well-nourished.  HENT:  Head: Normocephalic and atraumatic.  Eyes: Conjunctivae and EOM are normal. Pupils are equal, round, and reactive to light.  Neck: Normal range of motion. Neck supple.  Cardiovascular: Normal rate and regular rhythm.   Pulmonary/Chest: Effort normal and breath sounds normal.  Abdominal: Soft. Bowel sounds are normal.  Musculoskeletal: Normal range of motion.  Neurological: She is alert and oriented to person, place, and time. She has normal strength and normal reflexes. She displays normal reflexes. No sensory deficit. She displays a negative Romberg sign.  Skin: Skin is warm and dry.  Psychiatric: She has a normal mood and affect.    ED Course  Procedures (including critical care time) DIAGNOSTIC STUDIES: Oxygen Saturation is 100% on room air, normal by my interpretation.    COORDINATION OF CARE: 12:37am- Patient informed of current plan for treatment and evaluation and agrees with plan at this time.   Results for orders placed during the hospital encounter of 01/29/12  CBC      Component Value Range   WBC 9.3  4.0 - 10.5 K/uL   RBC 3.75 (*) 3.87 - 5.11 MIL/uL   Hemoglobin 10.5 (*) 12.0 - 15.0 g/dL   HCT 31.0 (*) 36.0 - 46.0 %   MCV 82.7  78.0 - 100.0 fL   MCH 28.0  26.0 - 34.0 pg   MCHC 33.9  30.0 - 36.0 g/dL   RDW 11.4 (*) 11.5 - 15.5 %   Platelets 265  150 - 400 K/uL  BASIC METABOLIC PANEL      Component Value Range   Sodium 141  135 - 145 mEq/L   Potassium 3.6  3.5 - 5.1 mEq/L   Chloride 100  96 - 112 mEq/L   CO2 30  19 - 32 mEq/L   Glucose,  Bld 224 (*) 70 - 99 mg/dL   BUN 20  6 - 23 mg/dL   Creatinine, Ser 1.30 (*) 0.50 - 1.10 mg/dL   Calcium 9.5  8.4 - 10.5 mg/dL   GFR calc non  Af Amer 45 (*) >90 mL/min   GFR calc Af Amer 52 (*) >90 mL/min   Ct Head Wo Contrast  01/28/2012  *RADIOLOGY REPORT*  Clinical Data: Headache. Nausea and vomiting.  CT HEAD WITHOUT CONTRAST  Technique:  Contiguous axial images were obtained from the base of the skull through the vertex without contrast.  Comparison: No priors.  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  No evidence of intracerebral hemorrhage, no focal mass, mass effect, hydrocephalus or abnormal intra or extra-axial fluid collections. No acute displaced skull fractures are identified.  Visualized paranasal sinuses and mastoids are well pneumatized.  IMPRESSION: 1. 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  However, these are slightly less well defined than is commonly seen, such that subacute lacunar infarctions from an embolic source would be difficult to entirely exclude.  If there is clinical concern for an acute stroke, further evaluation with brain MRI may be warranted.  Original Report Authenticated By: Etheleen Mayhew, M.D.     Date: 01/29/2012  Rate: 70  Rhythm: normal sinus rhythm  QRS Axis: normal  Intervals: normal  ST/T Wave abnormalities: nonspecific ST changes  Conduction Disutrbances:none  Narrative Interpretation:   Old EKG Reviewed: unchanged  Altered mental status prior to arrival, now resolved. Normal neuro exam as above. Labile blood pressure.  Concern for TIA. Patient also requiring blood pressure monitoring and possible medication adjustments. I do not feel patient would be best served on the  emergency department observation protocol. Case discussed as above with Dr. Orvan Falconer at 2:21 AM and he agrees with plan to transfer patient to [facility] to [facility]. He accepts patient in transfer.  Repeat blood pressure normalizing. Patient agreeable to plan. Plan admit for MRI and altered metal status workup and regulation of blood pressure.   MDM   Old records reviewed. Nursing notes reviewed. Vital signs reviewed. Imaging and labs reviewed as above. EKG as above. No atrial fibrillation. Serial neurologic exams unchanged. Transferred to [place] for medical admission.      I personally performed the services described in this documentation, which was scribed in my presence. The recorded information has been reviewed and considered.     Teressa Lower, MD 01/29/12 [facility] [place]

## 2012-01-29 NOTE — H&P (Signed)
Mary Valencia is an 56 y.o. female.   Patient was seen and examined on January 29, 2012 at 5:53 AM. PCP - Dr. Alain Marion. Chief Complaint: Confusion and dizziness. 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 As per patient patient was experiencing right-sided parieto-occipital headache 2 days ago which was persistent. She had initially called her PCPs office and was told to double her metoprolol dose. Despite which patient still had headache and presented to the ER yesterday morning. Over there patient blood pressure was found to be very high and was prescribed clonidine and as per patient one dose was given in the ER and she was given a prescription to take home. She took a second dose late in the evening around 10 PM she became confused and was talking out of her mind and she was dizzy. The whole episode lasted for around 15 minutes. Her husband brought her to the ER. CT head done without contrast in the earlier visit was showing nonspecific changes concerning for lacunar infarcts. Patient has been admitted for further workup. Presently patient denies any headache any nausea vomiting (she did have nausea vomiting yesterday morning twice). Denies any weakness of upper or lower extremities. Denies chest pain shortness of breath. EKG shows sinus rhythm.  Past Medical History  Diagnosis Date  . Type II or unspecified type diabetes mellitus without mention of complication, not stated as uncontrolled   . GERD (gastroesophageal reflux disease)   . Hyperlipidemia   . HTN (hypertension)   . Allergic rhinitis     Past Surgical History  Procedure Date  . Nephrectomy     right - related to untreated strep infection and stones  . Abdominal hysterectomy     partial  . Tonsillectomy   . Kidney surgery    right removed    Family History  Problem Relation Age of Onset  . Diabetes Other   . Heart disease Mother 54    MI  . Heart disease Father 18    MI  . Alcohol abuse Sister   . Kidney disease Sister   . Heart disease Brother     ?CHF ?CAD  . Heart disease Maternal Aunt    Social History:  reports that she has never smoked. She has never used smokeless tobacco. She reports that she does not drink alcohol or use illicit drugs.  Allergies:  Allergies  Allergen Reactions  . Betadine (Povidone Iodine) Other (See Comments)    Pt says it burns  . Iodine Hives  . Penicillins Swelling  . Sulfonamide Derivatives Hives  . Tetracyclines & Related Nausea And Vomiting  . Naproxen Rash    Medications Prior to Admission  Medication Sig Dispense Refill  . aspirin 81 MG tablet Take 81 mg by mouth daily.      . B Complex-C (SUPER B COMPLEX) TABS Take 1 tablet by mouth daily.      . benazepril (LOTENSIN) 40 MG tablet Take 40 mg by mouth daily.        . Cholecalciferol (VITAMIN D-3) 5000 UNITS TABS Take 1 tablet by mouth daily.      . cloNIDine (CATAPRES) 0.2 MG tablet Take 0.5 tablets (0.1 mg total) by mouth 2 (two) times daily.  30 tablet  1  . gabapentin (NEURONTIN) 100 MG capsule Take 1 capsule (100 mg total) by mouth  3 (three) times daily.  90 capsule  3  . insulin glargine (LANTUS SOLOSTAR) 100 UNIT/ML injection Inject 10 Units into the skin daily.       Marland Kitchen KRILL OIL PO Take 1 capsule by mouth daily.      . Linagliptin-Metformin HCl (JENTADUETO) 2.5-500 MG TABS Take 1 tablet by mouth 2 (two) times daily.  60 tablet  11  . methocarbamol (ROBAXIN) 500 MG tablet Take 500 mg by mouth every 6 (six) hours.      . metoprolol (LOPRESSOR) 50 MG tablet Take 100 mg by mouth 2 (two) times daily.       . potassium chloride SA (K-DUR,KLOR-CON) 20 MEQ tablet Take 40 mEq by mouth every other day.       . traMADol (ULTRAM) 50 MG tablet Take 50 mg by mouth 2 (two) times daily as needed.        Results  for orders placed during the hospital encounter of 01/29/12 (from the past 48 hour(s))  CBC     Status: Abnormal   Collection Time   01/29/12  1:19 AM      Component Value Range Comment   WBC 9.3  4.0 - 10.5 K/uL    RBC 3.75 (*) 3.87 - 5.11 MIL/uL    Hemoglobin 10.5 (*) 12.0 - 15.0 g/dL DELTA CHECK NOTED   HCT 31.0 (*) 36.0 - 46.0 %    MCV 82.7  78.0 - 100.0 fL    MCH 28.0  26.0 - 34.0 pg    MCHC 33.9  30.0 - 36.0 g/dL    RDW 11.4 (*) 11.5 - 15.5 %    Platelets 265  150 - 400 K/uL   BASIC METABOLIC PANEL     Status: Abnormal   Collection Time   01/29/12  1:19 AM      Component Value Range Comment   Sodium 141  135 - 145 mEq/L    Potassium 3.6  3.5 - 5.1 mEq/L    Chloride 100  96 - 112 mEq/L    CO2 30  19 - 32 mEq/L    Glucose, Bld 224 (*) 70 - 99 mg/dL    BUN 20  6 - 23 mg/dL    Creatinine, Ser 1.30 (*) 0.50 - 1.10 mg/dL    Calcium 9.5  8.4 - 10.5 mg/dL    GFR calc non Af Amer 45 (*) >90 mL/min    GFR calc Af Amer 52 (*) >90 mL/min   GLUCOSE, CAPILLARY     Status: Abnormal   Collection Time   01/29/12  4:03 AM      Component Value Range Comment   Glucose-Capillary 183 (*) 70 - 99 mg/dL    Ct Head Wo Contrast  01/28/2012  *RADIOLOGY REPORT*  Clinical Data: Headache. Nausea and vomiting.  CT HEAD WITHOUT CONTRAST  Technique:  Contiguous axial images were obtained from the base of the skull through the vertex without contrast.  Comparison: No priors.  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  No evidence of intracerebral hemorrhage, no focal mass, mass effect, hydrocephalus or abnormal intra or extra-axial fluid collections. No acute displaced skull fractures are identified.  Visualized paranasal sinuses and mastoids are well pneumatized.  IMPRESSION: 1. Two areas of relatively well defined low attenuation in the left extreme capsule and  lateral aspect of the left putamen, favored to represent old  lacunar infarctions.  However, these are slightly less well defined than is commonly seen, such that subacute lacunar infarctions from an embolic source would be difficult to entirely exclude.  If there is clinical concern for an acute stroke, further evaluation with brain MRI may be warranted.  Original Report Authenticated By: Etheleen Mayhew, M.D.    Review of Systems  Constitutional: Negative.   Eyes: Negative.   Respiratory: Negative.   Cardiovascular: Negative.   Gastrointestinal: Negative.   Genitourinary: Negative.   Musculoskeletal: Negative.   Skin: Negative.   Neurological: Positive for dizziness and headaches.       Confusion.  Psychiatric/Behavioral: Negative.     Blood pressure 155/77, pulse 73, temperature 98.3 F (36.8 C), temperature source Oral, resp. rate 16, height 5\' 2"  (1.575 m), weight 44.589 kg (98 lb 4.8 oz), SpO2 100.00%. Physical Exam  Constitutional: She is oriented to person, place, and time. She appears well-developed and well-nourished. No distress.  HENT:  Head: Normocephalic and atraumatic.  Right Ear: External ear normal.  Left Ear: External ear normal.  Nose: Nose normal.  Mouth/Throat: Oropharynx is clear and moist. No oropharyngeal exudate.  Eyes: Conjunctivae are normal. Pupils are equal, round, and reactive to light. Right eye exhibits no discharge. Left eye exhibits no discharge. No scleral icterus.  Neck: Normal range of motion. Neck supple.  Cardiovascular: Normal rate and regular rhythm.   Respiratory: Effort normal and breath sounds normal. No respiratory distress. She has no wheezes. She has no rales.  GI: Soft. Bowel sounds are normal. She exhibits no distension. There is no tenderness. There is no rebound.  Musculoskeletal: Normal range of motion. She exhibits no edema and no tenderness.  Neurological: She is alert and oriented to person, place, and time.       Moves all  extremities.  Skin: Skin is warm and dry. She is not diaphoretic.     Assessment/Plan #1. Brief episode of confusion - acute encephalopathy/acute delirium which has essentially resolved - most likely related to clonidine. Clonidine will be discontinued. Since patient had nonspecific findings on the CT head patient will be placed on neuro checks and get MRI brain.  #2. Accelerated hypertension - blood pressure is reasonably controlled at this time. We will continue her home medications for now with when necessary IV hydralazine for systolic blood pressure more than 200. Closely follow blood pressure trends. #3. Elevated creatinine - patient's creatinine is slightly elevated than yesterday. I have ordered labs again to see the trend. #4. Anemia - when compared to yesterday's labs the repeat CBC was showing anemia and patient states she has never been anemic before. Denies any blood in the vomitus or stools. I have ordered a repeat CBC. If patient is anemic then may need further workup probably as outpatient if there is no significant fall in hemoglobin. #5. Diabetes mellitus2 - I have placed patient on status coverage. Hold off metformin for now until creatinine corrected.  CODE STATUS - full code.  Loye Vento N. 01/29/2012, 5:53 AM

## 2012-01-29 NOTE — Evaluation (Signed)
Physical Therapy Evaluation Patient Details Name: Mary Valencia MRN: [id number] DOB: Sep 26, 1955 Today's Date: 01/29/2012 Time: [id number] PT Time Calculation (min): 23 min  PT Assessment / Plan / Recommendation Clinical Impression  Pt is a 56 y/o female admitted with confusion and dizziness along with the below PT problem list.  Pt would benefit from acute PT to maximize independence and safety with gait due to mostly premorbid deficits from sciatica while facilitating d/c home without PT follow-up.    PT Assessment  Patient needs continued PT services    Follow Up Recommendations  No PT follow up    Barriers to Discharge None      Equipment Recommendations  Cane    Recommendations for Other Services     Frequency Min 4X/week    Precautions / Restrictions Precautions Precautions: None Restrictions Weight Bearing Restrictions: No   Pertinent Vitals/Pain 4/10 in left hip.  Pt repositioned.      Mobility  Bed Mobility Bed Mobility: Not assessed Supine to Sit: 5: Supervision Sitting - Scoot to Edge of Bed: 5: Supervision Transfers Transfers: Sit to Stand;Stand to Sit Sit to Stand: 5: Supervision;With upper extremity assist;From chair/3-in-1 Stand to Sit: 5: Supervision;With upper extremity assist;To chair/3-in-1 Details for Transfer Assistance: Verbal cues for safety. Ambulation/Gait Ambulation/Gait Assistance: 4: Min guard Ambulation Distance (Feet): 180 Feet Assistive device: None (Progressed to supervision with use of IV pole. To trial cane) Ambulation/Gait Assistance Details: Guarding for balance with premorbid balance deficits due left sciatica issues.  To trial single point cane to increase independence and steadiness. Gait Pattern: Step-through pattern;Decreased stride length;Antalgic Stairs: No Wheelchair Mobility Wheelchair Mobility: No Modified Rankin (Stroke Patients Only) Pre-Morbid Rankin Score: No significant disability Modified Rankin: Moderately  severe disability    Exercises     PT Diagnosis: Difficulty walking  PT Problem List: Decreased balance;Decreased activity tolerance;Decreased mobility;Decreased knowledge of use of DME;Pain PT Treatment Interventions: DME instruction;Gait training;Stair training;Functional mobility training;Therapeutic activities;Balance training;Patient/family education   PT Goals Acute Rehab PT Goals PT Goal Formulation: With patient Time For Goal Achievement: 02/05/12 Potential to Achieve Goals: Good Pt will go Sit to Stand: with modified independence PT Goal: Sit to Stand - Progress: Goal set today Pt will go Stand to Sit: with modified independence PT Goal: Stand to Sit - Progress: Goal set today Pt will Ambulate: >150 feet;with modified independence;with least restrictive assistive device PT Goal: Ambulate - Progress: Goal set today Pt will Go Up / Down Stairs: 1-2 stairs;with modified independence;with least restrictive assistive device PT Goal: Up/Down Stairs - Progress: Goal set today  Visit Information  Last PT Received On: 01/29/12 Assistance Needed: +1    Subjective Data  Subjective: "I feel like I'm at my normal level." Patient Stated Goal: Go home.   Prior Functioning  Home Living Lives With: Spouse Available Help at Discharge: Family;Available 24 hours/day Type of Home: House Home Access: Stairs to enter [employer] of Steps: 1 Entrance Stairs-Rails: None Home Layout: One level Bathroom Shower/Tub: Product/process development scientist: Standard Bathroom Accessibility: Yes How Accessible: Accessible via walker Home Adaptive Equipment: None Prior Function Level of Independence: Needs assistance Needs Assistance: Gait Gait Assistance: handheld assist Able to Take Stairs?: No Driving: Yes Vocation: Unemployed Communication Communication: No difficulties Dominant Hand: Left    Cognition  Overall Cognitive Status: Appears within functional limits for  tasks assessed/performed Arousal/Alertness: Awake/alert Orientation Level: Oriented X4 / Intact Behavior During Session: [facility] for tasks performed    Extremity/Trunk Assessment Right Upper Extremity Assessment RUE ROM/Strength/Tone:  Within functional levels RUE Sensation: WFL - Light Touch RUE Coordination: WFL - gross/fine motor Left Upper Extremity Assessment LUE ROM/Strength/Tone: Within functional levels LUE Sensation: WFL - Light Touch LUE Coordination: WFL - gross/fine motor Right Lower Extremity Assessment RLE ROM/Strength/Tone: Within functional levels RLE Sensation: WFL - Light Touch RLE Coordination: WFL - gross/fine motor Left Lower Extremity Assessment LLE ROM/Strength/Tone: Within functional levels LLE Sensation: WFL - Light Touch LLE Coordination: WFL - gross/fine motor Trunk Assessment Trunk Assessment: Normal   Balance Balance Balance Assessed: No  End of Session PT - End of Session Equipment Utilized During Treatment: Gait belt Activity Tolerance: Patient tolerated treatment well Patient left: in chair (in w/c with transfer to ECHO.) Nurse Communication: Mobility status  GP     Cyndia Bent 01/29/2012, 10:21 AM  01/29/2012 Cyndia Bent, PT, DPT [phone]

## 2012-01-29 NOTE — Progress Notes (Signed)
VASCULAR LAB PRELIMINARY  PRELIMINARY  PRELIMINARY  PRELIMINARY  Carotid duplex completed.    Preliminary report:  Bilateral:  No evidence of hemodynamically significant internal carotid artery stenosis.   Vertebral artery flow is antegrade.     Saraih Lorton, RVS 01/29/2012, 10:46 AM

## 2012-01-29 NOTE — ED Notes (Signed)
Patient transferred to [facility] by [facility]. Spouse has all personal belongings.

## 2012-01-29 NOTE — Progress Notes (Signed)
Would also add that OTS spoke with Spouse of patient at length about discharge home . Spouse agreeable and states pt is near baseline during session.  I agree with the following treatment note after reviewing documentation.   Sharol Harness [place]   OTR/L Pager: [phone] Office: [phone] .

## 2012-01-29 NOTE — Progress Notes (Signed)
Pt discharged to home with husband and sisters. She has no further questions after receiving discharge isntructions. Symptoms r/t clonidine and/or neurontin. She is aware of when to call the doctor and when to call 911. She will take BP regularly and record for MD office. Stable for DC. IV removed.

## 2012-01-29 NOTE — ED Notes (Signed)
Pt reports she was confused earlier, around 2300- states she tried to speak and "words came out wrong"- sx resolved at this time- speech clear, cao x 4 at present- pt recently started on clonidine and ?medication reaction

## 2012-02-08 ENCOUNTER — Encounter: Payer: Self-pay | Admitting: Internal Medicine

## 2012-02-08 ENCOUNTER — Ambulatory Visit (INDEPENDENT_AMBULATORY_CARE_PROVIDER_SITE_OTHER): Payer: Self-pay | Admitting: Internal Medicine

## 2012-02-08 VITALS — BP 130/80 | HR 91 | Temp 97.2°F | Resp 17 | Wt 101.2 lb

## 2012-02-08 DIAGNOSIS — R634 Abnormal weight loss: Secondary | ICD-10-CM

## 2012-02-08 DIAGNOSIS — K219 Gastro-esophageal reflux disease without esophagitis: Secondary | ICD-10-CM

## 2012-02-08 DIAGNOSIS — I10 Essential (primary) hypertension: Secondary | ICD-10-CM

## 2012-02-08 DIAGNOSIS — M545 Low back pain: Secondary | ICD-10-CM

## 2012-02-08 DIAGNOSIS — E119 Type 2 diabetes mellitus without complications: Secondary | ICD-10-CM

## 2012-02-08 DIAGNOSIS — E785 Hyperlipidemia, unspecified: Secondary | ICD-10-CM

## 2012-02-08 MED ORDER — PANTOPRAZOLE SODIUM 40 MG PO TBEC: 40.0000 mg | DELAYED_RELEASE_TABLET | Freq: Every day | ORAL | Status: DC

## 2012-02-08 MED ORDER — TRAMADOL HCL 50 MG PO TABS: 50.0000 mg | ORAL_TABLET | Freq: Three times a day (TID) | ORAL | Status: DC | PRN

## 2012-02-08 NOTE — Assessment & Plan Note (Signed)
Re-start Protonix 

## 2012-02-08 NOTE — Assessment & Plan Note (Signed)
Better - cont Rx 

## 2012-02-08 NOTE — Progress Notes (Signed)
Patient ID: Mary Valencia, female   DOB: Nov 02, 1955, 56 y.o.   MRN: [id number]   Subjective:    Patient ID: Mary Valencia, female    DOB: 1956/05/25, 56 y.o.   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 As per patient patient was experiencing right-sided parieto-occipital headache 2 days ago which was persistent. She had initially called her PCPs office and was told to double her metoprolol dose. Despite which patient still had headache and presented to the ER yesterday morning. Over there patient blood pressure was found to be very high and was prescribed clonidine and as per patient one dose was given in the ER and she was given a prescription to take home. She took a second dose late in the evening around 10 PM she became confused and was talking out of her mind and she was dizzy. The whole episode lasted for around 15 minutes. Her husband brought her to the ER. CT head done without contrast in the earlier visit was showing nonspecific changes concerning for lacunar infarcts. Patient has been admitted for further workup. Presently patient denies any headache any nausea vomiting (she did have nausea vomiting yesterday morning twice). Denies any weakness of upper or lower extremities. Denies chest pain shortness of breath. EKG shows sinus rhythm.  #1. Brief episode of confusion - acute encephalopathy/acute delirium which has essentially resolved - most likely related to clonidine. Clonidine will be discontinued. Since patient had nonspecific findings on the CT head, pt was placed on neuro checks and MRI brain was tested and no acute findings reported (see full MRI/MRA reports). No PT/OT follow up was recommended. Patient did very well with them.  #2.  Accelerated hypertension - blood pressure is controlled at this time. We will continue her home medications for now except discontinued clonidine. Follow up with PCP this week for recheck.  #3. Elevated creatinine - patient's creatinine is slightly elevated at 1.1. Pt received IVFs and recommend rechecking with PCP later this week on follow up. Continue metformin for now.  #4. Anemia - Hg 10.5 after hydration. Repeat Hg was 10.2. Denies any blood in the vomitus or stools. No melena reported.  #5. Diabetes mellitus2 - resume home meds, follow up with PCP  #6. Constipation - home on senna/docusate 1 tab po BID prn constipation  Significant Diagnostic Studies:  CT HEAD  1. 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 However, these are slightly  less well defined than is commonly seen, such that subacute lacunar  infarctions from an embolic source would be difficult to entirely  exclude. If there is clinical concern for an acute stroke, further  evaluation with brain MRI may be warranted.  MRI HEAD  1. No acute intracranial abnormality. No abnormal signal to  correspond to that questioned by CT.  2. Mild for age nonspecific white matter signal changes.  3. See MRA findings below.  MRA HEAD  IMPRESSION:  1. 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  2. Otherwise negative intracranial MRA allowing for some motion  artifact.  Carotid Dopplers: Preliminary  report: Bilateral: No evidence of hemodynamically significant internal carotid artery stenosis. Vertebral artery flow is antegrade.   BP Readings from Last 3 Encounters:  02/08/12 130/80  01/29/12 129/77  01/28/12 195/82   Wt Readings from Last 3 Encounters:  02/08/12 101 lb 4 oz (45.927 kg)  01/29/12 98 lb 4.8 oz (44.589 kg)    01/24/12 106 lb (48.081 kg)       Review of Systems  Constitutional: Positive for fatigue and unexpected weight change. Negative for chills, activity change and appetite change.  HENT: Negative for congestion, mouth sores and sinus pressure.   Eyes: Negative for visual disturbance.  Respiratory: Negative for cough and chest tightness.   Gastrointestinal: Negative for nausea.  Genitourinary: Negative for frequency, difficulty urinating and vaginal pain.  Musculoskeletal: Positive for back pain and arthralgias. Negative for gait problem.  Skin: Negative for pallor and rash.  Neurological: Positive for weakness. Negative for dizziness and tremors.  Psychiatric/Behavioral: Negative for confusion and disturbed wake/sleep cycle. The patient is not nervous/anxious.        Objective:   Physical Exam  Constitutional: She appears well-developed. No distress.       Very thin  HENT:  Head: Normocephalic.  Right Ear: External ear normal.  Left Ear: External ear normal.  Nose: Nose normal.  Mouth/Throat: Oropharynx is clear and moist.  Eyes: Conjunctivae are normal. Pupils are equal, round, and reactive to light. Right eye exhibits no discharge. Left eye exhibits no discharge.  Neck: Normal range of motion. Neck supple. No JVD present. No tracheal deviation present. No thyromegaly present.  Cardiovascular: Normal rate, regular rhythm and normal heart sounds.   Pulmonary/Chest: No stridor. No respiratory distress. She has no wheezes.  Abdominal: Soft. Bowel sounds are normal. She exhibits no distension and no mass. There is no tenderness. There is no rebound and no guarding.  Genitourinary: Guaiac negative stool. No vaginal discharge found.  Musculoskeletal: She exhibits tenderness. She exhibits no edema.       LS is tender  Lymphadenopathy:    She has no cervical adenopathy.  Neurological: She displays normal reflexes. No cranial nerve deficit. She exhibits normal muscle tone. Coordination  normal.  Skin: No rash noted. No erythema.  Psychiatric: She has a normal mood and affect. Her behavior is normal. Judgment and thought content normal.   Lab Results  Component Value Date   WBC 7.9 01/29/2012   HGB 10.2* 01/29/2012   HCT 30.2* 01/29/2012   PLT 252 01/29/2012   GLUCOSE 154* 01/29/2012   CHOL 289* 09/29/2010   TRIG 91.0 09/29/2010   HDL 62.80 09/29/2010   LDLDIRECT 207.8 09/29/2010   ALT 6 01/29/2012   AST 9 01/29/2012   NA 141 01/29/2012   K 3.5 01/29/2012   CL 102 01/29/2012   CREATININE 1.14* 01/29/2012   BUN 21 01/29/2012   CO2 30 01/29/2012   TSH 1.743 01/29/2012   HGBA1C 7.8* 01/29/2012          Assessment & Plan:

## 2012-02-08 NOTE — Assessment & Plan Note (Signed)
Better Continue with current prescription therapy as reflected on the Med list.  

## 2012-02-08 NOTE — Assessment & Plan Note (Signed)
Krill oil 

## 2012-02-08 NOTE — Assessment & Plan Note (Signed)
A little better - using Glucerna w/meals

## 2012-02-08 NOTE — Assessment & Plan Note (Signed)
Continue with current prescription therapy as reflected on the Med list.  

## 2012-02-13 ENCOUNTER — Other Ambulatory Visit: Payer: Self-pay | Admitting: *Deleted

## 2012-02-13 MED ORDER — POTASSIUM CHLORIDE CRYS ER 20 MEQ PO TBCR: 40.0000 meq | EXTENDED_RELEASE_TABLET | Freq: Every day | ORAL | Status: DC

## 2012-02-16 DIAGNOSIS — E785 Hyperlipidemia, unspecified: Secondary | ICD-10-CM | POA: Insufficient documentation

## 2012-03-21 ENCOUNTER — Encounter: Payer: Self-pay | Admitting: Internal Medicine

## 2012-03-21 ENCOUNTER — Ambulatory Visit (INDEPENDENT_AMBULATORY_CARE_PROVIDER_SITE_OTHER): Payer: Self-pay | Admitting: Internal Medicine

## 2012-03-21 VITALS — BP 118/70 | HR 76 | Temp 97.7°F | Resp 16 | Wt 94.5 lb

## 2012-03-21 DIAGNOSIS — R0789 Other chest pain: Secondary | ICD-10-CM

## 2012-03-21 DIAGNOSIS — I10 Essential (primary) hypertension: Secondary | ICD-10-CM

## 2012-03-21 DIAGNOSIS — K219 Gastro-esophageal reflux disease without esophagitis: Secondary | ICD-10-CM

## 2012-03-21 DIAGNOSIS — E119 Type 2 diabetes mellitus without complications: Secondary | ICD-10-CM

## 2012-03-21 DIAGNOSIS — G459 Transient cerebral ischemic attack, unspecified: Secondary | ICD-10-CM | POA: Insufficient documentation

## 2012-03-21 DIAGNOSIS — M545 Low back pain: Secondary | ICD-10-CM

## 2012-03-21 DIAGNOSIS — R634 Abnormal weight loss: Secondary | ICD-10-CM

## 2012-03-21 NOTE — Assessment & Plan Note (Signed)
Continue with current prescription therapy as reflected on the Med list.  

## 2012-03-21 NOTE — Assessment & Plan Note (Signed)
No relapse 

## 2012-03-21 NOTE — Progress Notes (Signed)
   Subjective:    Patient ID: Mary Valencia, female    DOB: Sep 16, 1955, 56 y.o.   MRN: [id number]  HPI  F/u hosp adm for LBP, L hip pain. She lost wt. LBP is worse. F/u DM2, HTN, GERD     BP Readings from Last 3 Encounters:  03/21/12 118/70  02/08/12 130/80  01/29/12 129/77   Wt Readings from Last 3 Encounters:  03/21/12 94 lb 8 oz (42.865 kg)  02/08/12 101 lb 4 oz (45.927 kg)  01/29/12 98 lb 4.8 oz (44.589 kg)       Review of Systems  Constitutional: Positive for unexpected weight change. Negative for chills, activity change, appetite change and fatigue.  HENT: Negative for congestion, mouth sores and sinus pressure.   Eyes: Negative for visual disturbance.  Respiratory: Negative for cough and chest tightness.   Gastrointestinal: Negative for nausea.  Genitourinary: Negative for frequency, difficulty urinating and vaginal pain.  Musculoskeletal: Positive for back pain and arthralgias. Negative for gait problem.  Skin: Negative for pallor and rash.  Neurological: Negative for dizziness and tremors.  Psychiatric/Behavioral: Negative for confusion and disturbed wake/sleep cycle. The patient is not nervous/anxious.        Objective:   Physical Exam  Constitutional: She appears well-developed. No distress.       Very thin   HENT:  Head: Normocephalic.  Right Ear: External ear normal.  Left Ear: External ear normal.  Nose: Nose normal.  Mouth/Throat: Oropharynx is clear and moist.  Eyes: Conjunctivae normal are normal. Pupils are equal, round, and reactive to light. Right eye exhibits no discharge. Left eye exhibits no discharge.  Neck: Normal range of motion. Neck supple. No JVD present. No tracheal deviation present. No thyromegaly present.  Cardiovascular: Normal rate, regular rhythm and normal heart sounds.   Pulmonary/Chest: No stridor. No respiratory distress. She has no wheezes.  Abdominal: Soft. Bowel sounds are normal. She exhibits no distension and no mass.  There is no tenderness. There is no rebound and no guarding.  Genitourinary: Guaiac negative stool. No vaginal discharge found.  Musculoskeletal: She exhibits no edema and no tenderness.  Lymphadenopathy:    She has no cervical adenopathy.  Neurological: She displays normal reflexes. No cranial nerve deficit. She exhibits normal muscle tone. Coordination normal.  Skin: No rash noted. No erythema.  Psychiatric: She has a normal mood and affect. Her behavior is normal. Judgment and thought content normal.   Lab Results  Component Value Date   WBC 7.9 01/29/2012   HGB 10.2* 01/29/2012   HCT 30.2* 01/29/2012   PLT 252 01/29/2012   GLUCOSE 154* 01/29/2012   CHOL 289* 09/29/2010   TRIG 91.0 09/29/2010   HDL 62.80 09/29/2010   LDLDIRECT 207.8 09/29/2010   ALT 6 01/29/2012   AST 9 01/29/2012   NA 141 01/29/2012   K 3.5 01/29/2012   CL 102 01/29/2012   CREATININE 1.14* 01/29/2012   BUN 21 01/29/2012   CO2 30 01/29/2012   TSH 1.743 01/29/2012   HGBA1C 7.8* 01/29/2012          Assessment & Plan:

## 2012-03-21 NOTE — Assessment & Plan Note (Signed)
She states it is better.Marland KitchenMarland Kitchen

## 2012-04-18 ENCOUNTER — Other Ambulatory Visit: Payer: Self-pay | Admitting: *Deleted

## 2012-04-18 MED ORDER — AMLODIPINE BESYLATE 10 MG PO TABS: 10.0000 mg | ORAL_TABLET | Freq: Every day | ORAL | Status: DC

## 2012-05-02 ENCOUNTER — Telehealth: Payer: Self-pay | Admitting: Internal Medicine

## 2012-05-02 ENCOUNTER — Encounter: Payer: Self-pay | Admitting: Internal Medicine

## 2012-05-02 ENCOUNTER — Ambulatory Visit (INDEPENDENT_AMBULATORY_CARE_PROVIDER_SITE_OTHER): Payer: Self-pay | Admitting: Internal Medicine

## 2012-05-02 ENCOUNTER — Other Ambulatory Visit (INDEPENDENT_AMBULATORY_CARE_PROVIDER_SITE_OTHER): Payer: Self-pay

## 2012-05-02 VITALS — BP 150/80 | HR 76 | Temp 96.7°F | Resp 16 | Wt 100.4 lb

## 2012-05-02 DIAGNOSIS — R000 Tachycardia, unspecified: Secondary | ICD-10-CM

## 2012-05-02 DIAGNOSIS — I10 Essential (primary) hypertension: Secondary | ICD-10-CM

## 2012-05-02 DIAGNOSIS — M545 Low back pain, unspecified: Secondary | ICD-10-CM

## 2012-05-02 DIAGNOSIS — R634 Abnormal weight loss: Secondary | ICD-10-CM

## 2012-05-02 DIAGNOSIS — E119 Type 2 diabetes mellitus without complications: Secondary | ICD-10-CM

## 2012-05-02 DIAGNOSIS — G459 Transient cerebral ischemic attack, unspecified: Secondary | ICD-10-CM

## 2012-05-02 LAB — BASIC METABOLIC PANEL
BUN: 14 mg/dL (ref 6–23)
Calcium: 9.4 mg/dL (ref 8.4–10.5)
GFR: 87.19 mL/min (ref >60.00)
Glucose, Bld: 82 mg/dL (ref 70–99)
Potassium: 4.7 mEq/L (ref 3.5–5.1)
Sodium: 138 mEq/L (ref 135–145)

## 2012-05-02 LAB — HEMOGLOBIN A1C: Hgb A1c MFr Bld: 8.7 % — ABNORMAL HIGH (ref 4.6–6.5)

## 2012-05-02 MED ORDER — METOPROLOL TARTRATE 50 MG PO TABS: 50.0000 mg | ORAL_TABLET | Freq: Two times a day (BID) | ORAL | Status: DC

## 2012-05-02 MED ORDER — BENAZEPRIL HCL 40 MG PO TABS: 40.0000 mg | ORAL_TABLET | Freq: Every day | ORAL | Status: DC

## 2012-05-02 NOTE — Assessment & Plan Note (Signed)
No relapse 

## 2012-05-02 NOTE — Assessment & Plan Note (Signed)
Better Continue with Tramadol prn

## 2012-05-02 NOTE — Telephone Encounter (Signed)
Erline Levine, please, inform patient that all labs are normal except for elev A1c. Pls adjust Lantus if needed Thx

## 2012-05-02 NOTE — Assessment & Plan Note (Signed)
better 

## 2012-05-02 NOTE — Assessment & Plan Note (Signed)
Continue with current prescription therapy as reflected on the Med list.  

## 2012-05-02 NOTE — Assessment & Plan Note (Signed)
Better  

## 2012-05-02 NOTE — Progress Notes (Signed)
   Subjective:    Patient ID: Mary Valencia, female    DOB: [birth year], 56 y.o.   MRN: [id number]  HPI  F/u on LBP, L hip pain. She started to gain wt. LBP is better. F/u DM2, HTN, GERD     BP Readings from Last 3 Encounters:  05/02/12 150/80  03/21/12 118/70  02/08/12 130/80   Wt Readings from Last 3 Encounters:  05/02/12 100 lb 6 oz (45.53 kg)  03/21/12 94 lb 8 oz (42.865 kg)  02/08/12 101 lb 4 oz (45.927 kg)       Review of Systems  Constitutional: Positive for unexpected weight change. Negative for chills, activity change, appetite change and fatigue.  HENT: Negative for congestion, mouth sores and sinus pressure.   Eyes: Negative for visual disturbance.  Respiratory: Negative for cough and chest tightness.   Gastrointestinal: Negative for nausea.  Genitourinary: Negative for frequency, difficulty urinating and vaginal pain.  Musculoskeletal: Positive for back pain and arthralgias. Negative for gait problem.  Skin: Negative for pallor and rash.  Neurological: Negative for dizziness and tremors.  Psychiatric/Behavioral: Negative for confusion and sleep disturbance. The patient is not nervous/anxious.        Objective:   Physical Exam  Constitutional: She appears well-developed. No distress.       Very thin   HENT:  Head: Normocephalic.  Right Ear: External ear normal.  Left Ear: External ear normal.  Nose: Nose normal.  Mouth/Throat: Oropharynx is clear and moist.  Eyes: Conjunctivae normal are normal. Pupils are equal, round, and reactive to light. Right eye exhibits no discharge. Left eye exhibits no discharge.  Neck: Normal range of motion. Neck supple. No JVD present. No tracheal deviation present. No thyromegaly present.  Cardiovascular: Normal rate, regular rhythm and normal heart sounds.   Pulmonary/Chest: No stridor. No respiratory distress. She has no wheezes.  Abdominal: Soft. Bowel sounds are normal. She exhibits no distension and no mass. There is  no tenderness. There is no rebound and no guarding.  Genitourinary: Guaiac negative stool. No vaginal discharge found.  Musculoskeletal: She exhibits no edema and no tenderness.  Lymphadenopathy:    She has no cervical adenopathy.  Neurological: She displays normal reflexes. No cranial nerve deficit. She exhibits normal muscle tone. Coordination normal.  Skin: No rash noted. No erythema.  Psychiatric: She has a normal mood and affect. Her behavior is normal. Judgment and thought content normal.   Lab Results  Component Value Date   WBC 7.9 01/29/2012   HGB 10.2* 01/29/2012   HCT 30.2* 01/29/2012   PLT 252 01/29/2012   GLUCOSE 154* 01/29/2012   CHOL 289* 09/29/2010   TRIG 91.0 09/29/2010   HDL 62.80 09/29/2010   LDLDIRECT 207.8 09/29/2010   ALT 6 01/29/2012   AST 9 01/29/2012   NA 141 01/29/2012   K 3.5 01/29/2012   CL 102 01/29/2012   CREATININE 1.14* 01/29/2012   BUN 21 01/29/2012   CO2 30 01/29/2012   TSH 1.743 01/29/2012   HGBA1C 7.8* 01/29/2012          Assessment & Plan:

## 2012-05-02 NOTE — Assessment & Plan Note (Signed)
Wt Readings from Last 3 Encounters:  05/02/12 100 lb 6 oz (45.53 kg)  03/21/12 94 lb 8 oz (42.865 kg)  02/08/12 101 lb 4 oz (45.927 kg)  better

## 2012-05-06 NOTE — Telephone Encounter (Signed)
Pt informed

## 2012-05-07 ENCOUNTER — Other Ambulatory Visit: Payer: Self-pay | Admitting: *Deleted

## 2012-05-07 MED ORDER — PRAVASTATIN SODIUM 40 MG PO TABS: 40.0000 mg | ORAL_TABLET | Freq: Every day | ORAL | Status: DC

## 2012-05-07 NOTE — Telephone Encounter (Signed)
R'cd fax from [place] for refill of Pravachol.

## 2012-07-08 ENCOUNTER — Other Ambulatory Visit: Payer: Self-pay | Admitting: *Deleted

## 2012-07-08 MED ORDER — LINAGLIPTIN-METFORMIN HCL 2.5-500 MG PO TABS: 1.0000 | ORAL_TABLET | Freq: Two times a day (BID) | ORAL | Status: DC

## 2012-08-02 ENCOUNTER — Ambulatory Visit (INDEPENDENT_AMBULATORY_CARE_PROVIDER_SITE_OTHER): Payer: Self-pay | Admitting: Internal Medicine

## 2012-08-02 ENCOUNTER — Other Ambulatory Visit (INDEPENDENT_AMBULATORY_CARE_PROVIDER_SITE_OTHER): Payer: Self-pay

## 2012-08-02 ENCOUNTER — Encounter: Payer: Self-pay | Admitting: Internal Medicine

## 2012-08-02 VITALS — BP 112/62 | HR 72 | Temp 97.4°F | Resp 16 | Wt 100.0 lb

## 2012-08-02 DIAGNOSIS — R634 Abnormal weight loss: Secondary | ICD-10-CM

## 2012-08-02 DIAGNOSIS — R0789 Other chest pain: Secondary | ICD-10-CM

## 2012-08-02 DIAGNOSIS — E119 Type 2 diabetes mellitus without complications: Secondary | ICD-10-CM

## 2012-08-02 DIAGNOSIS — E785 Hyperlipidemia, unspecified: Secondary | ICD-10-CM

## 2012-08-02 DIAGNOSIS — I10 Essential (primary) hypertension: Secondary | ICD-10-CM

## 2012-08-02 LAB — BASIC METABOLIC PANEL
BUN: 20 mg/dL (ref 6–23)
CO2: 34 mEq/L — ABNORMAL HIGH (ref 19–32)
Calcium: 10 mg/dL (ref 8.4–10.5)
Chloride: 102 mEq/L (ref 96–112)
Creatinine, Ser: 0.9 mg/dL (ref 0.4–1.2)

## 2012-08-02 MED ORDER — AMLODIPINE BESYLATE 10 MG PO TABS: 10.0000 mg | ORAL_TABLET | Freq: Every day | ORAL | Status: DC

## 2012-08-02 MED ORDER — LOVASTATIN 20 MG PO TABS: 20.0000 mg | ORAL_TABLET | Freq: Every day | ORAL | Status: DC

## 2012-08-02 NOTE — Assessment & Plan Note (Signed)
Continue with current prescription therapy as reflected on the Med list.  

## 2012-08-02 NOTE — Assessment & Plan Note (Signed)
Continue with current prescription therapy as reflected on the Med list. Labs  

## 2012-08-02 NOTE — Progress Notes (Signed)
   Subjective:    HPI  F/u on LBP, L hip pain - 3/10 on 2 Tramadols a day. She started to gain wt. LBP is better. F/u DM2, HTN, GERD     BP Readings from Last 3 Encounters:  08/02/12 112/62  05/02/12 150/80  03/21/12 118/70   Wt Readings from Last 3 Encounters:  08/02/12 100 lb (45.36 kg)  05/02/12 100 lb 6 oz (45.53 kg)  03/21/12 94 lb 8 oz (42.865 kg)       Review of Systems  Constitutional: Positive for unexpected weight change. Negative for chills, activity change, appetite change and fatigue.  HENT: Negative for congestion, mouth sores and sinus pressure.   Eyes: Negative for visual disturbance.  Respiratory: Negative for cough and chest tightness.   Gastrointestinal: Negative for nausea.  Genitourinary: Negative for frequency, difficulty urinating and vaginal pain.  Musculoskeletal: Positive for back pain and arthralgias. Negative for gait problem.  Skin: Negative for pallor and rash.  Neurological: Negative for dizziness and tremors.  Psychiatric/Behavioral: Negative for confusion and sleep disturbance. The patient is not nervous/anxious.        Objective:   Physical Exam  Constitutional: She appears well-developed. No distress.       Very thin   HENT:  Head: Normocephalic.  Right Ear: External ear normal.  Left Ear: External ear normal.  Nose: Nose normal.  Mouth/Throat: Oropharynx is clear and moist.  Eyes: Conjunctivae normal are normal. Pupils are equal, round, and reactive to light. Right eye exhibits no discharge. Left eye exhibits no discharge.  Neck: Normal range of motion. Neck supple. No JVD present. No tracheal deviation present. No thyromegaly present.  Cardiovascular: Normal rate, regular rhythm and normal heart sounds.   Pulmonary/Chest: No stridor. No respiratory distress. She has no wheezes.  Abdominal: Soft. Bowel sounds are normal. She exhibits no distension and no mass. There is no tenderness. There is no rebound and no guarding.   Genitourinary: Guaiac negative stool. No vaginal discharge found.  Musculoskeletal: She exhibits no edema and no tenderness.  Lymphadenopathy:    She has no cervical adenopathy.  Neurological: She displays normal reflexes. No cranial nerve deficit. She exhibits normal muscle tone. Coordination normal.  Skin: No rash noted. No erythema.  Psychiatric: She has a normal mood and affect. Her behavior is normal. Judgment and thought content normal.   Lab Results  Component Value Date   WBC 7.9 01/29/2012   HGB 10.2* 01/29/2012   HCT 30.2* 01/29/2012   PLT 252 01/29/2012   GLUCOSE 82 05/02/2012   CHOL 289* 09/29/2010   TRIG 91.0 09/29/2010   HDL 62.80 09/29/2010   LDLDIRECT 207.8 09/29/2010   ALT 6 01/29/2012   AST 9 01/29/2012   NA 138 05/02/2012   K 4.7 05/02/2012   CL 100 05/02/2012   CREATININE 0.9 05/02/2012   BUN 14 05/02/2012   CO2 31 05/02/2012   TSH 1.743 01/29/2012   HGBA1C 8.7* 05/02/2012          Assessment & Plan:

## 2012-08-02 NOTE — Assessment & Plan Note (Signed)
Stable

## 2012-08-02 NOTE — Assessment & Plan Note (Signed)
No relapse 

## 2012-08-02 NOTE — Assessment & Plan Note (Signed)
Change to Lovastatin due to cost

## 2012-08-03 ENCOUNTER — Encounter: Payer: Self-pay | Admitting: Internal Medicine

## 2012-08-03 ENCOUNTER — Telehealth: Payer: Self-pay | Admitting: Internal Medicine

## 2012-08-03 DIAGNOSIS — E875 Hyperkalemia: Secondary | ICD-10-CM

## 2012-08-03 NOTE — Telephone Encounter (Signed)
Mary Valencia, please, ask the pt to come for a re-check BMET due to elev K. Thx

## 2012-08-07 ENCOUNTER — Encounter: Payer: Self-pay | Admitting: Internal Medicine

## 2012-08-07 NOTE — Telephone Encounter (Signed)
No working phone number: home or cell. Order is entered

## 2012-08-12 NOTE — Telephone Encounter (Signed)
Pt informed

## 2012-08-16 ENCOUNTER — Telehealth: Payer: Self-pay | Admitting: *Deleted

## 2012-08-16 NOTE — Telephone Encounter (Signed)
Pt left vm requesting a letter of limitations for disability. Please advise.

## 2012-08-21 NOTE — Telephone Encounter (Signed)
Done. Thx.

## 2012-09-09 ENCOUNTER — Ambulatory Visit: Payer: Self-pay | Admitting: Internal Medicine

## 2012-09-09 ENCOUNTER — Telehealth: Payer: Self-pay | Admitting: Internal Medicine

## 2012-09-09 NOTE — Telephone Encounter (Signed)
Take Norvasc 1/2 tab a day Re-start Benazepril OV Thx

## 2012-09-09 NOTE — Telephone Encounter (Signed)
Patient no-showed her appt for today

## 2012-09-09 NOTE — Telephone Encounter (Signed)
[facility] Triage Call Report Triage Record Num: [id number] Operator: Helene Kelp Patient Name: Mary Valencia Call Date & Time: 09/08/2012 11:57:39PM Patient Phone: [phone] PCP: Walker Kehr Patient Gender: Female PCP Fax : [phone] Patient DOB: 17-Dec-1955 Practice Name: [facility] Reason for Call: Caller: [id number]; PCP: Walker Kehr; CB#: [phone]; Call regarding Elevated BP 175/104; Afebrile; Onset: 09/08/12; Sx notes: Blood pressure is high tonight, denies any sx at this time, earlier had a headache which is what prompted her to take her blood pressure, was at 201/121, now is down to 175/104, head ache is gone; Norvasc was decreased to 1/2 tablet 1 week ago, when reduced it, her blood pressure went up, then started taking taking the whole pill, but then she thought she had ran out of the Benazepril, so did not take it for two days, today is the second day, found another pack so she can start taking it again tomorrow; Guideline used: HTN, Diagnosed or Suspected; Disposition: See provider within 24 hours due to sudden elevation in blood pressure AND has not been taking blood pressure medication as Rx, OR recently changed dose of Rx; Appt. made: Yes, on 09/09/12 at 1030 with Dr. Alain Marion. Protocol(s) Used: Hypertension, Diagnosed or Suspected Recommended Outcome per Protocol: See Provider within 24 hours Reason for Outcome: A sudden elevation in blood pressure (0000000 or higher systolic OR [id number] or higher diastolic) AND has not been taking blood pressure medication as prescribed, OR recently started, changed, or stopped taking prescription, nonprescription or alternative meds Care Advice: Call EMS 911 immediately if you have a sudden elevation of systolic BP 99991111 or greater, OR if diastolic BP is 123456 or greater AND are having chest pain, shortness of breath, back pain, numbness/weakness, or change in vision.

## 2012-10-17 ENCOUNTER — Telehealth: Payer: Self-pay

## 2012-10-17 NOTE — Telephone Encounter (Signed)
Phone call from pt requesting samples of Jentadueto 2.5/500 mg. I called pt back and LM that she can pick up samples.

## 2012-10-25 ENCOUNTER — Ambulatory Visit (HOSPITAL_BASED_OUTPATIENT_CLINIC_OR_DEPARTMENT_OTHER)
Admission: RE | Admit: 2012-10-25 | Discharge: 2012-10-25 | Disposition: A | Discharge status: Home / Self Care | Payer: Self-pay | Source: Ambulatory Visit | Attending: Family Medicine | Admitting: Family Medicine

## 2012-10-25 ENCOUNTER — Ambulatory Visit (INDEPENDENT_AMBULATORY_CARE_PROVIDER_SITE_OTHER): Payer: Self-pay | Admitting: Family Medicine

## 2012-10-25 ENCOUNTER — Inpatient Hospital Stay
Admission: RE | Admit: 2012-10-25 | Discharge: 2012-10-25 | Disposition: A | Discharge status: Home / Self Care | Payer: Self-pay | Source: Ambulatory Visit | Attending: Family Medicine | Admitting: Family Medicine

## 2012-10-25 ENCOUNTER — Telehealth: Payer: Self-pay | Admitting: Internal Medicine

## 2012-10-25 ENCOUNTER — Encounter: Payer: Self-pay | Admitting: Family Medicine

## 2012-10-25 VITALS — BP 130/70 | HR 68 | Temp 98.2°F | Ht 62.0 in | Wt 100.0 lb

## 2012-10-25 DIAGNOSIS — I10 Essential (primary) hypertension: Secondary | ICD-10-CM

## 2012-10-25 DIAGNOSIS — E119 Type 2 diabetes mellitus without complications: Secondary | ICD-10-CM | POA: Insufficient documentation

## 2012-10-25 DIAGNOSIS — M79609 Pain in unspecified limb: Secondary | ICD-10-CM | POA: Insufficient documentation

## 2012-10-25 DIAGNOSIS — R609 Edema, unspecified: Secondary | ICD-10-CM | POA: Insufficient documentation

## 2012-10-25 DIAGNOSIS — M79661 Pain in right lower leg: Secondary | ICD-10-CM

## 2012-10-25 DIAGNOSIS — R350 Frequency of micturition: Secondary | ICD-10-CM

## 2012-10-25 LAB — CBC
HCT: 37 % (ref 36.0–46.0)
MCH: 27 pg (ref 26.0–34.0)
MCHC: 33 g/dL (ref 30.0–36.0)
MCV: 81.9 fL (ref 78.0–100.0)
Platelets: 288 10*3/uL (ref 150–400)
RDW: 12.9 % (ref 11.5–15.5)

## 2012-10-25 LAB — RENAL FUNCTION PANEL
Albumin: 4.3 g/dL (ref 3.5–5.2)
BUN: 15 mg/dL (ref 6–23)
Calcium: 9.9 mg/dL (ref 8.4–10.5)
Creat: 0.92 mg/dL (ref 0.50–1.10)
Phosphorus: 3.4 mg/dL (ref 2.3–4.6)

## 2012-10-25 MED ORDER — FUROSEMIDE 20 MG PO TABS: 20.0000 mg | ORAL_TABLET | Freq: Every day | ORAL | Status: DC | PRN

## 2012-10-25 NOTE — Telephone Encounter (Signed)
Pt scheduled at [facility] [place] for today at 3:45

## 2012-10-25 NOTE — Patient Instructions (Addendum)
Edema Edema is an abnormal build-up of fluids in tissues. Because this is partly dependent on gravity (water flows to the lowest place), it is more common in the legs and thighs (lower extremities). It is also common in the looser tissues, like around the eyes. Painless swelling of the feet and ankles is common and increases as a person ages. It may affect both legs and may include the calves or even thighs. When squeezed, the fluid may move out of the affected area and may leave a dent for a few moments. CAUSES   Prolonged standing or sitting in one place for extended periods of time. Movement helps pump tissue fluid into the veins, and absence of movement prevents this, resulting in edema.  Varicose veins. The valves in the veins do not work as well as they should. This causes fluid to leak into the tissues.  Fluid and salt overload.  Injury, burn, or surgery to the leg, ankle, or foot, may damage veins and allow fluid to leak out.  Sunburn damages vessels. Leaky vessels allow fluid to go out into the sunburned tissues.  Allergies (from insect bites or stings, medications or chemicals) cause swelling by allowing vessels to become leaky.  Protein in the blood helps keep fluid in your vessels. Low protein, as in malnutrition, allows fluid to leak out.  Hormonal changes, including pregnancy and menstruation, cause fluid retention. This fluid may leak out of vessels and cause edema.  Medications that cause fluid retention. Examples are sex hormones, blood pressure medications, steroid treatment, or anti-depressants.  Some illnesses cause edema, especially heart failure, kidney disease, or liver disease.  Surgery that cuts veins or lymph nodes, such as surgery done for the heart or for breast cancer, may result in edema. DIAGNOSIS  Your caregiver is usually easily able to determine what is causing your swelling (edema) by simply asking what is wrong (getting a history) and examining you (doing  a physical). Sometimes x-rays, EKG (electrocardiogram or heart tracing), and blood work may be done to evaluate for underlying medical illness. TREATMENT  General treatment includes:  Leg elevation (or elevation of the affected body part).  Restriction of fluid intake.  Prevention of fluid overload.  Compression of the affected body part. Compression with elastic bandages or support stockings squeezes the tissues, preventing fluid from entering and forcing it back into the blood vessels.  Diuretics (also called water pills or fluid pills) pull fluid out of your body in the form of increased urination. These are effective in reducing the swelling, but can have side effects and must be used only under your caregiver's supervision. Diuretics are appropriate only for some types of edema. The specific treatment can be directed at any underlying causes discovered. Heart, liver, or kidney disease should be treated appropriately. HOME CARE INSTRUCTIONS   Elevate the legs (or affected body part) above the level of the heart, while lying down.  Avoid sitting or standing still for prolonged periods of time.  Avoid putting anything directly under the knees when lying down, and do not wear constricting clothing or garters on the upper legs.  Exercising the legs causes the fluid to work back into the veins and lymphatic channels. This may help the swelling go down.  The pressure applied by elastic bandages or support stockings can help reduce ankle swelling.  A low-salt diet may help reduce fluid retention and decrease the ankle swelling.  Take any medications exactly as prescribed. SEEK MEDICAL CARE IF:  Your edema is   not responding to recommended treatments. SEEK IMMEDIATE MEDICAL CARE IF:   You develop shortness of breath or chest pain.  You cannot breathe when you lay down; or if, while lying down, you have to get up and go to the window to get your breath.  You are having increasing  swelling without relief from treatment.  You develop a fever over 102 F (38.9 C).  You develop pain or redness in the areas that are swollen.  Tell your caregiver right away if you have gained 3 lb/1.4 kg in 1 day or 5 lb/2.3 kg in a week. MAKE SURE YOU:   Understand these instructions.  Will watch your condition.  Will get help right away if you are not doing well or get worse. Document Released: 06/12/2005 Document Revised: 12/12/2011 Document Reviewed: 01/29/2008 Exit[employer] Patient Information 2013 [employer].  

## 2012-10-25 NOTE — Telephone Encounter (Signed)
Caller: Lunette/Patient; Phone: [phone]; Reason for Call: Pt calling back today 10/25/12 regarding she called earlier this morning and was triaged by nurse and advised that someone from office will call her back to let her know if she can be worked in for appt.  She has not heard back from office.  Triage pulled chart and see where pt was triaged earlier and a high priority message was sent to the clinical pool.  Also see message from Malva Cogan who forwarded the message to Glena Norfolk asking her to call pt to help with appt.  Triage called office and transferred call to [place] who is going to assist pt with appt.

## 2012-10-25 NOTE — Telephone Encounter (Signed)
Patient Information:  Caller Name: Levia  Phone: [phone]  Patient: Lesha, Youngkin  Gender: Female  DOB: 1955-07-05  Age: 57 Years  PCP: Plotnikov, Alex (Adults only)  Office Follow Up:  Does the office need to follow up with this patient?: Yes  Instructions For The Office: OFFICE PLEASE FOLLOW UP WITH PATIENT.. RN COULD NOT FIND ANY OPENINGS.  CAN PT BE WORKED INTO SCHEDULE?   Symptoms  Reason For Call & Symptoms: feet swelling (pt is concerned because of her history of 1 kidney and DM)  Reviewed Health History In EMR: Yes  Reviewed Medications In EMR: Yes  Reviewed Allergies In EMR: Yes  Reviewed Surgeries / Procedures: Yes  Date of Onset of Symptoms: 10/19/2012  Guideline(s) Used:  Leg Swelling and Edema  Disposition Per Guideline:   See Today in Office  Reason For Disposition Reached:   Moderate swelling of both ankles (e.g., swelling extends up to the knees) AND new onset or worsening  Advice Given:  Call Back If:  Swelling becomes worse  You become worse.  Patient Will Follow Care Advice:  YES

## 2012-10-26 LAB — URINALYSIS
Glucose, UA: >1000 mg/dL — AB
Hgb urine dipstick: NEGATIVE
Ketones, ur: NEGATIVE mg/dL
Leukocytes, UA: NEGATIVE
Nitrite: NEGATIVE
Specific Gravity, Urine: >1.03 — ABNORMAL HIGH (ref 1.005–1.030)
pH: 6 (ref 5.0–8.0)

## 2012-10-27 ENCOUNTER — Encounter: Payer: Self-pay | Admitting: Family Medicine

## 2012-10-27 DIAGNOSIS — R609 Edema, unspecified: Secondary | ICD-10-CM | POA: Insufficient documentation

## 2012-10-27 HISTORY — DX: Edema, unspecified: R60.9

## 2012-10-27 NOTE — Assessment & Plan Note (Addendum)
With some calf tenderness in RLE, Ultrasound on right negative for DVT. Encouraged elevation of legs, minimize sodium knee hi compression hose and follow up with PMD. Given a small amount of Lasix to use prn

## 2012-10-27 NOTE — Assessment & Plan Note (Addendum)
Well controlled at today's visit. 

## 2012-10-27 NOTE — Assessment & Plan Note (Signed)
Sugar noted to be over 300 on check at visit. Encouraged to increase Lantus by 2 units and decrease carb intake til seen by PMD next week.

## 2012-10-27 NOTE — Progress Notes (Signed)
Patient ID: Mary Valencia, female   DOB: 10/17/1955, 57 y.o.   MRN: [id number] Mary Valencia [id number] 09-Jan-1956 10/27/2012      Progress Note-Follow Up  Subjective  Chief Complaint  Chief Complaint  Patient presents with  . Leg Swelling    HPI  Patient is a 57 year old American female who is in today complaining of worsening peripheral edema. Significantly worse in the right lower terminate in the left lower extremity. She did just have a car ride but only to [place]. She is and well. Denies chest pain, palpitations, shortness of breath, GI or GU concerns otherwise today. Continues to struggle with fatigue and malaise.  Past Medical History  Diagnosis Date  . Type II or unspecified type diabetes mellitus without mention of complication, not stated as uncontrolled   . GERD (gastroesophageal reflux disease)   . Hyperlipidemia   . HTN (hypertension)   . Allergic rhinitis   . Edema 10/27/2012    L>R    Past Surgical History  Procedure Laterality Date  . Nephrectomy      right - related to untreated strep infection and stones  . Abdominal hysterectomy      partial  . Tonsillectomy    . Kidney surgery      right removed    Family History  Problem Relation Age of Onset  . Diabetes Other   . Heart disease Mother 71    MI  . Heart disease Father 81    MI  . Alcohol abuse Sister   . Kidney disease Sister   . Heart disease Brother     ?CHF ?CAD  . Heart disease Maternal Aunt     History   Social History  . Marital Status: Married    Spouse Name: N/A    Number of Children: N/A  . Years of Education: N/A   Occupational History  . Not on file.   Social History Main Topics  . Smoking status: Never Smoker   . Smokeless tobacco: Never Used  . Alcohol Use: No  . Drug Use: No  . Sexually Active: Yes   Other Topics Concern  . 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 AMITRIPTYLINE HCL PO Take by mouth at bedtime.      Marland Kitchen amLODipine (NORVASC) 10 MG tablet Take 1 tablet (10 mg total) by mouth daily.  30 tablet  11  . aspirin 81 MG tablet Take 81 mg by mouth daily.      . B Complex-C (SUPER B COMPLEX) TABS Take 1 tablet by mouth daily.      . benazepril (LOTENSIN) 40 MG tablet Take 1 tablet (40 mg total) by mouth daily.  30 tablet  11  . Cholecalciferol (VITAMIN D-3) 5000 UNITS TABS Take 1 tablet by mouth daily.      . insulin glargine (LANTUS SOLOSTAR) 100 UNIT/ML injection Inject 10 Units into the skin daily.       Marland Kitchen KRILL OIL PO Take 1 capsule by mouth daily.      . Linagliptin-Metformin HCl (JENTADUETO) 2.5-500 MG TABS Take 1 tablet by mouth 2 (two) times daily.  56 tablet  0  . lovastatin (MEVACOR) 20 MG tablet Take 1 tablet (20 mg total) by mouth at bedtime.  30 tablet  11  .  metoprolol (LOPRESSOR) 50 MG tablet Take 1 tablet (50 mg total) by mouth 2 (two) times daily.  60 tablet  11  . pantoprazole (PROTONIX) 40 MG tablet Take 1 tablet (40 mg total) by mouth daily.  30 tablet  11  . potassium chloride SA (K-DUR,KLOR-CON) 20 MEQ tablet Take 2 tablets (40 mEq total) by mouth daily.  180 tablet  1  . traMADol (ULTRAM) 50 MG tablet Take 1-2 tablets (50-100 mg total) by mouth every 8 (eight) hours as needed.  120 tablet  2  . senna-docusate (SENOKOT-S) 8.6-50 MG per tablet Take 1 tablet by mouth 2 (two) times daily as needed for constipation.  30 tablet  0   No current facility-administered medications on file prior to visit.    Allergies  Allergen Reactions  . Betadine (Povidone Iodine) Other (See Comments)    Pt says it burns  . Clonidine Derivatives     Dropped BP  . Gabapentin     A bad reaction - confused  . Iodine Hives  . Penicillins Swelling  . Sulfonamide Derivatives Hives  . Tetracyclines & Related Nausea And Vomiting  . Naproxen Rash    Review of  Systems  Review of Systems  Constitutional: Positive for malaise/fatigue. Negative for fever.  HENT: Negative for congestion.   Eyes: Negative for discharge.  Respiratory: Negative for shortness of breath.   Cardiovascular: Positive for leg swelling. Negative for chest pain and palpitations.  Gastrointestinal: Negative for nausea, abdominal pain and diarrhea.  Genitourinary: Negative for dysuria.  Musculoskeletal: Negative for falls.  Skin: Negative for rash.  Neurological: Negative for loss of consciousness and headaches.  Endo/Heme/Allergies: Negative for polydipsia.  Psychiatric/Behavioral: Negative for depression and suicidal ideas. The patient is not nervous/anxious and does not have insomnia.     Objective  BP 130/70  Pulse 68  Temp(Src) 98.2 F (36.8 C) (Oral)  Ht 5\' 2"  (1.575 m)  Wt 100 lb (45.36 kg)  BMI 18.29 kg/m2  SpO2 99%  Physical Exam  Physical Exam  Constitutional: She is oriented to person, place, and time and well-developed, well-nourished, and in no distress. No distress.  HENT:  Head: Normocephalic and atraumatic.  Eyes: Conjunctivae are normal.  Neck: Neck supple. No thyromegaly present.  Cardiovascular: Normal rate, regular rhythm and normal heart sounds.   No murmur heard. Pulmonary/Chest: Effort normal and breath sounds normal. She has no wheezes.  Abdominal: Soft. Bowel sounds are normal. She exhibits no distension and no mass.  Musculoskeletal: She exhibits edema.  Trace pedal edema left ankle, 1+ edema right ankle  Lymphadenopathy:    She has no cervical adenopathy.  Neurological: She is alert and oriented to person, place, and time.  Skin: Skin is warm and dry. No rash noted. She is not diaphoretic.  Psychiatric: Memory, affect and judgment normal.    Lab Results  Component Value Date   TSH 1.743 01/29/2012   Lab Results  Component Value Date   WBC 7.0 10/25/2012   HGB 12.2 10/25/2012   HCT 37.0 10/25/2012   MCV 81.9 10/25/2012   PLT 288  10/25/2012   Lab Results  Component Value Date   CREATININE 0.92 10/25/2012   BUN 15 10/25/2012   NA 140 10/25/2012   K 4.6 10/25/2012   CL 101 10/25/2012   CO2 32 10/25/2012   Lab Results  Component Value Date   ALT 6 01/29/2012   AST 9 01/29/2012   ALKPHOS 42 01/29/2012   BILITOT 0.2* 01/29/2012   Lab  Results  Component Value Date   CHOL 289* 09/29/2010   Lab Results  Component Value Date   HDL 62.80 09/29/2010   No results found for this basename: [facility]   Lab Results  Component Value Date   TRIG 91.0 09/29/2010   Lab Results  Component Value Date   CHOLHDL 5 09/29/2010     Assessment & Plan  HYPERTENSION Well controlled at today's visit.  Edema With some calf tenderness in RLE, Ultrasound on right negative for DVT. Encouraged elevation of legs, minimize sodium knee hi compression hose and follow up with PMD. Given a small amount of Lasix to use prn  DIABETES MELLITUS, TYPE II Sugar noted to be over 300 on check at visit. Encouraged to increase Lantus by 2 units and decrease carb intake til seen by PMD next week.

## 2012-10-28 NOTE — Progress Notes (Signed)
Quick Note:  Patient Informed and voiced understanding ______ 

## 2012-10-28 NOTE — Progress Notes (Signed)
Quick Note:  Patient Informed and states that she took 7 units of Lantus on 10-17-12 and her BS was 224 but when she got up the next morning and checked the BS it was 58 so she hasn't taken anymore.  Pt said she can start back at 7 units? Please advise? ______

## 2012-10-30 ENCOUNTER — Encounter: Payer: Self-pay | Admitting: Internal Medicine

## 2012-10-30 ENCOUNTER — Ambulatory Visit (INDEPENDENT_AMBULATORY_CARE_PROVIDER_SITE_OTHER): Payer: Self-pay | Admitting: Internal Medicine

## 2012-10-30 VITALS — BP 170/72 | HR 72 | Temp 98.2°F | Resp 16 | Wt 102.0 lb

## 2012-10-30 DIAGNOSIS — E119 Type 2 diabetes mellitus without complications: Secondary | ICD-10-CM

## 2012-10-30 DIAGNOSIS — I10 Essential (primary) hypertension: Secondary | ICD-10-CM

## 2012-10-30 DIAGNOSIS — R634 Abnormal weight loss: Secondary | ICD-10-CM

## 2012-10-30 NOTE — Assessment & Plan Note (Signed)
Continue with current prescription therapy as reflected on the Med list. Endocr ref

## 2012-10-30 NOTE — Progress Notes (Signed)
   Subjective:    HPI  F/u on LBP, L hip pain - 5-7/10 on 3 Tramadols a day now. She started to gain wt. LBP is better. She is unable to walk far or to stand >5 min F/u DM2, HTN, GERD     BP Readings from Last 3 Encounters:  10/30/12 170/72  10/25/12 130/70  08/02/12 112/62   Wt Readings from Last 3 Encounters:  10/30/12 102 lb (46.267 kg)  10/25/12 100 lb (45.36 kg)  08/02/12 100 lb (45.36 kg)       Review of Systems  Constitutional: Positive for unexpected weight change. Negative for chills, activity change, appetite change and fatigue.  HENT: Negative for congestion, mouth sores and sinus pressure.   Eyes: Negative for visual disturbance.  Respiratory: Negative for cough and chest tightness.   Gastrointestinal: Negative for nausea.  Genitourinary: Negative for frequency, difficulty urinating and vaginal pain.  Musculoskeletal: Positive for back pain and arthralgias. Negative for gait problem.  Skin: Negative for pallor and rash.  Neurological: Negative for dizziness and tremors.  Psychiatric/Behavioral: Negative for confusion and sleep disturbance. The patient is not nervous/anxious.        Objective:   Physical Exam  Constitutional: She appears well-developed. No distress.  Very thin   HENT:  Head: Normocephalic.  Right Ear: External ear normal.  Left Ear: External ear normal.  Nose: Nose normal.  Mouth/Throat: Oropharynx is clear and moist.  Eyes: Conjunctivae are normal. Pupils are equal, round, and reactive to light. Right eye exhibits no discharge. Left eye exhibits no discharge.  Neck: Normal range of motion. Neck supple. No JVD present. No tracheal deviation present. No thyromegaly present.  Cardiovascular: Normal rate, regular rhythm and normal heart sounds.   Pulmonary/Chest: No stridor. No respiratory distress. She has no wheezes.  Abdominal: Soft. Bowel sounds are normal. She exhibits no distension and no mass. There is no tenderness. There is no  rebound and no guarding.  Genitourinary: Guaiac negative stool. No vaginal discharge found.  Musculoskeletal: She exhibits no edema and no tenderness.  Lymphadenopathy:    She has no cervical adenopathy.  Neurological: She displays normal reflexes. No cranial nerve deficit. She exhibits normal muscle tone. Coordination normal.  Skin: No rash noted. No erythema.  Psychiatric: She has a normal mood and affect. Her behavior is normal. Judgment and thought content normal.   Lab Results  Component Value Date   WBC 7.0 10/25/2012   HGB 12.2 10/25/2012   HCT 37.0 10/25/2012   PLT 288 10/25/2012   GLUCOSE 343* 10/25/2012   CHOL 289* 09/29/2010   TRIG 91.0 09/29/2010   HDL 62.80 09/29/2010   LDLDIRECT 207.8 09/29/2010   ALT 6 01/29/2012   AST 9 01/29/2012   NA 140 10/25/2012   K 4.6 10/25/2012   CL 101 10/25/2012   CREATININE 0.92 10/25/2012   BUN 15 10/25/2012   CO2 32 10/25/2012   TSH 1.743 01/29/2012   HGBA1C 7.8* 08/02/2012          Assessment & Plan:

## 2012-10-30 NOTE — Assessment & Plan Note (Signed)
Chronic. Poor control, labile Continue with current prescription therapy as reflected on the Med list.

## 2012-10-30 NOTE — Assessment & Plan Note (Signed)
Wt Readings from Last 3 Encounters:  10/30/12 102 lb (46.267 kg)  10/25/12 100 lb (45.36 kg)  08/02/12 100 lb (45.36 kg)

## 2012-10-31 ENCOUNTER — Other Ambulatory Visit: Payer: Self-pay | Admitting: Internal Medicine

## 2012-11-12 ENCOUNTER — Ambulatory Visit (INDEPENDENT_AMBULATORY_CARE_PROVIDER_SITE_OTHER): Payer: Self-pay | Admitting: Internal Medicine

## 2012-11-12 ENCOUNTER — Encounter: Payer: Self-pay | Admitting: Internal Medicine

## 2012-11-12 VITALS — BP 138/80 | HR 78 | Temp 98.4°F | Resp 10 | Ht 62.5 in | Wt 104.0 lb

## 2012-11-12 DIAGNOSIS — E119 Type 2 diabetes mellitus without complications: Secondary | ICD-10-CM

## 2012-11-12 MED ORDER — LINAGLIPTIN-METFORMIN HCL 2.5-1000 MG PO TABS: 1.0000 | ORAL_TABLET | Freq: Two times a day (BID) | ORAL | Status: DC

## 2012-11-12 NOTE — Patient Instructions (Signed)
Please stop the Lantus. Continue Jentadueto 2.10-998 mg twice a day. Please return in 1 month. I will send you th results through [place]. Start a B-complex vitamin a day.

## 2012-11-12 NOTE — Progress Notes (Signed)
Patient ID: Mary Valencia, female   DOB: [birth year], 57 y.o.   MRN: [id number]  HPI: Mary Valencia is a 57 y.o.-year-old female, referred by her PCP, Dr. Alain Marion, for management of DM2, insulin-dependent, uncontrolled, with complications (cerebro-vascular ds.- h/o TIA, peripheral neuropathy).  Patient has been diagnosed with diabetes in 2008; she had gestational DM in 16 - at 6 weeks postpartum, her sugars were still high she started insulin about 1 year ago.   A year ago >> hyperglycemia with AMS. After this, she moved to [place], lost 130 lbs in 4-5 years - initially intentional, then the last 70 lbs unintentionally. Last summer, she was at ~140 lbs. She had a TIA last year. She believes this was b/c gabapentin >> extreme hypertension >> clonidine >> second dose of gabapentin, then the third dose >> extreme HTN again >> migraine >> TIA). She had a lot of pain in her hip and could did not have an appetite because of this, this contributed to her weight loss.  Last hemoglobin A1c was: Lab Results  Component Value Date   HGBA1C 7.8* 08/02/2012  - previously 8.7%  Pt is on a regimen of: - Jentadueto (Linagliptin-Metformin) 2.10-998 bid  - Lantus 5 units qhs - not taking it daily, only ~ 1x a week! She mentions that she also needs to take it when she is on the lower dose of Jentadueto, depending on the samples that she receives from her PCP. She cannot afford any type of meds, and is only using samples.  She tried Lantus 7 units before and she dropped her CBG from 200s at night to 58 in am.  Pt checks her sugars 1x a day and they are: - am: 120-129 Depending on what she has for snack the night before.  No lows recently, except sugar was 58; she has hypoglycemia awareness at 70. Highest sugar was 343 at her last lab check in 10/25/2012.   She has had nutrition advice at dx, but cannot afford better food. Pt's meals are: - Breakfast: oatmeal (instant) maple + brown sugar, or fried potatoes -  Lunch: sandwich - Dinner: chicken or [place] sandwich - Snacks: 3 She walks daily for exercise.  She had one kidney removed in 1982. In 1980, she was pregnant with her son and got strep throat >> developed kidney infection >> ABx for a long course >> urologist >> R kidney stopped fxn-ing, also had 3 kidney stones in it >> nephrectomy.  Pt does not have chronic kidney disease, last BUN/creatinine was:  Lab Results  Component Value Date   BUN 15 10/25/2012   CREATININE 0.92 10/25/2012   Last set of lipids: Lab Results  Component Value Date   CHOL 289* 09/29/2010   HDL 62.80 09/29/2010   LDLDIRECT 207.8 09/29/2010   TRIG 91.0 09/29/2010   CHOLHDL 5 09/29/2010  - she is taking lovastatin   Pt's last eye exam was in 2010. No DR - reportedly. Has numbness and tingling in her legs, also has burning of her soles.  I reviewed her chart and she also has a history of hypertension, hyperlipidemia, low back pain, and left hip pain, GERD, atypical chest pain, edema. Last TSH: Lab Results  Component Value Date   TSH 1.743 01/29/2012   Pt has FH of DM in mother and brother.  ROS: Constitutional: + weight loss, + fatigue, no subjective hyperthermia/hypothermia Eyes: no blurry vision, no xerophthalmia ENT: no sore throat, no nodules palpated in throat, no dysphagia/odynophagia, no hoarseness  Cardiovascular: no CP/SOB/+ palpitations/+ feet swelling Respiratory: no cough/SOB Gastrointestinal: no N/V/D/C Musculoskeletal: no muscle/joint aches Skin: no rashes, + itching Neurological:+ tremors/+ numbness - feet/no tingling/dizziness, + HAs Psychiatric: no depression/anxiety  Past Medical History  Diagnosis Date  . Type II or unspecified type diabetes mellitus without mention of complication, not stated as uncontrolled   . GERD (gastroesophageal reflux disease)   . Hyperlipidemia   . HTN (hypertension)   . Allergic rhinitis   . Edema 10/27/2012    L>R   Past Surgical History  Procedure Laterality Date   . Nephrectomy      right - related to untreated strep infection and stones  . Abdominal hysterectomy      partial  . Tonsillectomy    . Kidney surgery      right removed   History   Social History  . Marital Status: Married, husband retired    Spouse Name: N/A    Number of Children: 64  . Years of Education: N/A   Occupational History  . Not on file.   Social History Main Topics  . Smoking status: Never Smoker   . Smokeless tobacco: Never Used  . Alcohol Use: No  . Drug Use: No  . Sexually Active: Yes   Other Topics Concern  . 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 AMITRIPTYLINE HCL PO Take by mouth at bedtime.      Marland Kitchen amLODipine (NORVASC) 10 MG tablet Take 1 tablet (10 mg total) by mouth daily.  30 tablet  11  . aspirin 81 MG tablet Take 81 mg by mouth daily.      . benazepril (LOTENSIN) 40 MG tablet Take 1 tablet (40 mg total) by mouth daily.  30 tablet  11  . Cholecalciferol (VITAMIN D-3) 5000 UNITS TABS Take 1 tablet by mouth daily.      . furosemide (LASIX) 20 MG tablet Take 1 tablet (20 mg total) by mouth daily as needed.  10 tablet  0  . insulin glargine (LANTUS SOLOSTAR) 100 UNIT/ML injection Inject 5 Units into the skin daily.       . Linagliptin-Metformin HCl (JENTADUETO) 2.5-500 MG TABS Take 1 tablet by mouth 2 (two) times daily.  56 tablet  0  . metoprolol (LOPRESSOR) 50 MG tablet Take 1 tablet (50 mg total) by mouth 2 (two) times daily.  60 tablet  11  . pravastatin (PRAVACHOL) 40 MG tablet TAKE ONE TABLET BY MOUTH EVERY DAY  30 tablet  5  . senna-docusate (SENOKOT-S) 8.6-50 MG per tablet Take 1 tablet by mouth 2 (two) times daily as needed for constipation.  30 tablet  0  . tiZANidine (ZANAFLEX) 4 MG tablet Take 4 mg by mouth at bedtime.      . traMADol  (ULTRAM) 50 MG tablet TAKE ONE TO TWO TABLETS BY MOUTH EVERY 8 HOURS AS NEEDED  120 tablet  0  .       .      . pantoprazole (PROTONIX) 40 MG tablet Take 1 tablet (40 mg total) by mouth daily.  30 tablet  11  . potassium chloride SA (K-DUR,KLOR-CON) 20 MEQ tablet Take 2 tablets (40 mEq total) by mouth daily.  180 tablet  1   No current facility-administered medications on file prior to  visit.   Allergies  Allergen Reactions  . Betadine (Povidone Iodine) Other (See Comments)    Pt says it burns  . Clonidine Derivatives     Dropped BP  . Gabapentin     A bad reaction - confused  . Iodine Hives  . Penicillins Swelling  . Sulfonamide Derivatives Hives  . Tetracyclines & Related Nausea And Vomiting  . Naproxen Rash   Family History  Problem Relation Age of Onset  . Diabetes Other   . Heart disease Mother 16    MI  . Heart disease Father 45    MI  . Alcohol abuse Sister   . Kidney disease Sister   . Heart disease Brother     ?CHF ?CAD  . Heart disease Maternal Aunt   thyroid problems in 2 sisters and a brother  PE: BP 138/80  Pulse 78  Temp(Src) 98.4 F (36.9 C) (Oral)  Resp 10  Ht 5' 2.5" (1.588 m)  Wt 104 lb (47.174 kg)  BMI 18.71 kg/m2  SpO2 97% Wt Readings from Last 3 Encounters:  11/12/12 104 lb (47.174 kg)  10/30/12 102 lb (46.267 kg)  10/25/12 100 lb (45.36 kg)   Constitutional: underweight, in NAD Eyes: PERRLA, EOMI, mild exophthalmos, no lid lag, no stare ENT: moist mucous membranes, no thyromegaly, no cervical lymphadenopathy Cardiovascular: RRR, No MRG Respiratory: CTA B Gastrointestinal: abdomen soft, NT, ND, BS+ Musculoskeletal: no deformities, strength intact in all 4 Skin: moist, warm, no rashes Neurological: no tremor with outstretched hands, DTR normal in all 4  ASSESSMENT: 1. DM2, insulin-dependent, uncontrolled, with complications - Cerebrovascular ds, h/o TIA - peripheral neuropathy  PLAN:  1. Patient with an unusual evolution of her  diabetes, with a history of losing more than 100 pounds in the last 2 years. It is unclear how she actually lost the weight, when she mentions that her diet is mostly unhealthy, due to her poor finances, despite the fact that she knows how she should eat. She relies entirely on samples from PCP or, from now on, from [place] also. This is a difficult situation, and will need to try to give her a simple, cheap regimen - as much as possible. - since she's not taking the Lantus but once a week, I advised her that it is futile to take it at all, and advised her to stop it - Since her sugars on the Jentadueto are not far from target in a.m. (unclear how they change throughout the day), I suggested that she stays on the higher dose of Jentadueto - total of 10/1998 mg a day, and we'll see how she does without the insulin - given samples of Jentadueto, but I only had 3 bottles, which will cover her for less than a month. She still has some left, so hopefully, she will be covered until she sees me next time, in a month. This is not a permanent solution, and I discussed with her that it would be great if she could afford at least a metformin. She believes that she can do that, since this is on the 4$ list. We have better chance to give her samples of Linagliptin, rather than Jentadueto. She will let me know before she runs out of her medicines to see what we need to do. - given sugar log and advised how to fill it and to bring it at next appt - check CBGs twice a day, rotating check times - given foot care handout and explained the principles -  given instructions for hypoglycemia management "15-15 rule" - would check a hemoglobin A1c today - Advised her to get new eye exam, since it has been 4 years since her last one - no microalbumin/creatinine ratio since she is on ACE inhibitors - I will see her back in a month with her sugar log  Office Visit on 11/12/2012  Component Date Value Range Status  . Hemoglobin A1C  11/12/2012 10.3* 4.6 - 6.5 % Final   Glycemic Control Guidelines for People with Diabetes:Non Diabetic:  <6%Goal of Therapy: <7%Additional Action Suggested:  >8%    Based on this, I'm not convinced that we can manage her diabetes without insulin, but we will reevaluate her in a month.

## 2012-11-19 ENCOUNTER — Other Ambulatory Visit: Payer: Self-pay | Admitting: Internal Medicine

## 2012-12-19 ENCOUNTER — Ambulatory Visit: Payer: Self-pay | Admitting: Internal Medicine

## 2012-12-21 ENCOUNTER — Other Ambulatory Visit: Payer: Self-pay | Admitting: Internal Medicine

## 2012-12-25 ENCOUNTER — Other Ambulatory Visit: Payer: Self-pay | Admitting: Internal Medicine

## 2013-01-20 ENCOUNTER — Ambulatory Visit: Payer: Self-pay | Admitting: Internal Medicine

## 2013-01-20 DIAGNOSIS — Z0289 Encounter for other administrative examinations: Secondary | ICD-10-CM

## 2013-01-31 ENCOUNTER — Ambulatory Visit (INDEPENDENT_AMBULATORY_CARE_PROVIDER_SITE_OTHER): Payer: Self-pay | Admitting: Internal Medicine

## 2013-01-31 ENCOUNTER — Other Ambulatory Visit (INDEPENDENT_AMBULATORY_CARE_PROVIDER_SITE_OTHER): Payer: Self-pay

## 2013-01-31 ENCOUNTER — Encounter: Payer: Self-pay | Admitting: Internal Medicine

## 2013-01-31 VITALS — BP 188/78 | HR 76 | Temp 98.0°F | Resp 16 | Wt 112.0 lb

## 2013-01-31 DIAGNOSIS — E875 Hyperkalemia: Secondary | ICD-10-CM

## 2013-01-31 DIAGNOSIS — E119 Type 2 diabetes mellitus without complications: Secondary | ICD-10-CM

## 2013-01-31 DIAGNOSIS — M545 Low back pain, unspecified: Secondary | ICD-10-CM

## 2013-01-31 DIAGNOSIS — R634 Abnormal weight loss: Secondary | ICD-10-CM

## 2013-01-31 DIAGNOSIS — I10 Essential (primary) hypertension: Secondary | ICD-10-CM

## 2013-01-31 LAB — HEMOGLOBIN A1C: Hgb A1c MFr Bld: 9.2 % — ABNORMAL HIGH (ref 4.6–6.5)

## 2013-01-31 LAB — BASIC METABOLIC PANEL
BUN: 24 mg/dL — ABNORMAL HIGH (ref 6–23)
Chloride: 99 mEq/L (ref 96–112)
GFR: 72.24 mL/min (ref >60.00)
Potassium: 5.4 mEq/L — ABNORMAL HIGH (ref 3.5–5.1)

## 2013-01-31 NOTE — Assessment & Plan Note (Signed)
Did not take her meds today: BP ok at home Continue with current prescription therapy as reflected on the Med list.

## 2013-01-31 NOTE — Assessment & Plan Note (Signed)
Better  

## 2013-01-31 NOTE — Assessment & Plan Note (Signed)
Continue with current prescription therapy as reflected on the Med list.  

## 2013-01-31 NOTE — Progress Notes (Signed)
   Subjective:    HPI  F/u on LBP, L hip pain - 5-7/10 on 3 Tramadols a day now.  She started to gain wt. LBP is better. She is unable to walk far or to stand >5 min F/u DM2, HTN, GERD     BP Readings from Last 3 Encounters:  01/31/13 188/78  11/12/12 138/80  10/30/12 170/72   Wt Readings from Last 3 Encounters:  01/31/13 112 lb (50.803 kg)  11/12/12 104 lb (47.174 kg)  10/30/12 102 lb (46.267 kg)       Review of Systems  Constitutional: Positive for unexpected weight change. Negative for chills, activity change, appetite change and fatigue.  HENT: Negative for congestion, mouth sores and sinus pressure.   Eyes: Negative for visual disturbance.  Respiratory: Negative for cough and chest tightness.   Gastrointestinal: Negative for nausea.  Genitourinary: Negative for frequency, difficulty urinating and vaginal pain.  Musculoskeletal: Positive for back pain and arthralgias. Negative for gait problem.  Skin: Negative for pallor and rash.  Neurological: Negative for dizziness and tremors.  Psychiatric/Behavioral: Negative for confusion and sleep disturbance. The patient is not nervous/anxious.        Objective:   Physical Exam  Constitutional: She appears well-developed. No distress.  Very thin   HENT:  Head: Normocephalic.  Right Ear: External ear normal.  Left Ear: External ear normal.  Nose: Nose normal.  Mouth/Throat: Oropharynx is clear and moist.  Eyes: Conjunctivae are normal. Pupils are equal, round, and reactive to light. Right eye exhibits no discharge. Left eye exhibits no discharge.  Neck: Normal range of motion. Neck supple. No JVD present. No tracheal deviation present. No thyromegaly present.  Cardiovascular: Normal rate, regular rhythm and normal heart sounds.   Pulmonary/Chest: No stridor. No respiratory distress. She has no wheezes.  Abdominal: Soft. Bowel sounds are normal. She exhibits no distension and no mass. There is no tenderness. There is no  rebound and no guarding.  Genitourinary: Guaiac negative stool. No vaginal discharge found.  Musculoskeletal: She exhibits no edema and no tenderness.  Lymphadenopathy:    She has no cervical adenopathy.  Neurological: She displays normal reflexes. No cranial nerve deficit. She exhibits normal muscle tone. Coordination normal.  Skin: No rash noted. No erythema.  Psychiatric: She has a normal mood and affect. Her behavior is normal. Judgment and thought content normal.   Lab Results  Component Value Date   WBC 7.0 10/25/2012   HGB 12.2 10/25/2012   HCT 37.0 10/25/2012   PLT 288 10/25/2012   GLUCOSE 343* 10/25/2012   CHOL 289* 09/29/2010   TRIG 91.0 09/29/2010   HDL 62.80 09/29/2010   LDLDIRECT 207.8 09/29/2010   ALT 6 01/29/2012   AST 9 01/29/2012   NA 140 10/25/2012   K 4.6 10/25/2012   CL 101 10/25/2012   CREATININE 0.92 10/25/2012   BUN 15 10/25/2012   CO2 32 10/25/2012   TSH 1.743 01/29/2012   HGBA1C 10.3* 11/12/2012          Assessment & Plan:

## 2013-02-01 DIAGNOSIS — E875 Hyperkalemia: Secondary | ICD-10-CM | POA: Insufficient documentation

## 2013-02-01 NOTE — Assessment & Plan Note (Signed)
Hold potassium BMET in 1 wk

## 2013-02-27 ENCOUNTER — Telehealth: Payer: Self-pay | Admitting: *Deleted

## 2013-02-27 NOTE — Telephone Encounter (Signed)
Pt called requesting samples of Lantus Solastar, Rx not on current Rx list.  Please advise

## 2013-02-27 NOTE — Telephone Encounter (Signed)
Ok if we have Thx

## 2013-02-28 ENCOUNTER — Telehealth: Payer: Self-pay | Admitting: *Deleted

## 2013-02-28 NOTE — Telephone Encounter (Signed)
Spoke with pt advised we do not have any Lantus samples.  Pt states she is not completely out yet.

## 2013-02-28 NOTE — Telephone Encounter (Signed)
Refill request for Tramadol 50mg  Last OV 8.8.14 Last filled 7.2.14

## 2013-03-02 NOTE — Telephone Encounter (Signed)
OK to fill this prescription with additional refills x2 Thank you!  

## 2013-03-03 MED ORDER — TRAMADOL HCL 50 MG PO TABS: ORAL_TABLET | ORAL | Status: DC

## 2013-03-03 NOTE — Telephone Encounter (Signed)
Refill done.  

## 2013-05-01 ENCOUNTER — Other Ambulatory Visit: Payer: Self-pay

## 2013-05-06 ENCOUNTER — Encounter: Payer: Self-pay | Admitting: Internal Medicine

## 2013-05-06 ENCOUNTER — Ambulatory Visit (INDEPENDENT_AMBULATORY_CARE_PROVIDER_SITE_OTHER): Payer: Self-pay | Admitting: Internal Medicine

## 2013-05-06 VITALS — BP 112/72 | HR 71 | Temp 98.2°F | Wt 111.0 lb

## 2013-05-06 DIAGNOSIS — E119 Type 2 diabetes mellitus without complications: Secondary | ICD-10-CM

## 2013-05-06 DIAGNOSIS — M545 Low back pain: Secondary | ICD-10-CM

## 2013-05-06 DIAGNOSIS — R634 Abnormal weight loss: Secondary | ICD-10-CM

## 2013-05-06 DIAGNOSIS — R609 Edema, unspecified: Secondary | ICD-10-CM

## 2013-05-06 MED ORDER — TRAMADOL HCL 50 MG PO TABS: ORAL_TABLET | ORAL | Status: DC

## 2013-05-06 MED ORDER — AMLODIPINE BESYLATE 10 MG PO TABS: 5.0000 mg | ORAL_TABLET | Freq: Every day | ORAL | Status: DC

## 2013-05-06 NOTE — Progress Notes (Signed)
   Subjective:    HPI  F/u on LBP, L hip pain - 5-7/10 on 3 Tramadols a day now.  She started to gain wt. LBP is better. She is unable to walk far or to stand >5 min F/u DM2 - better, HTN, GERD  She is seeing  Herbalist now for "clensing"     BP Readings from Last 3 Encounters:  05/06/13 112/72  01/31/13 188/78  11/12/12 138/80   Wt Readings from Last 3 Encounters:  05/06/13 111 lb (50.349 kg)  01/31/13 112 lb (50.803 kg)  11/12/12 104 lb (47.174 kg)       Review of Systems  Constitutional: Positive for unexpected weight change. Negative for chills, activity change, appetite change and fatigue.  HENT: Negative for congestion, mouth sores and sinus pressure.   Eyes: Negative for visual disturbance.  Respiratory: Negative for cough and chest tightness.   Cardiovascular: Positive for leg swelling.  Gastrointestinal: Negative for nausea.  Genitourinary: Negative for frequency, difficulty urinating and vaginal pain.  Musculoskeletal: Positive for arthralgias and back pain. Negative for gait problem.  Skin: Negative for pallor and rash.  Neurological: Negative for dizziness, tremors, syncope and weakness.  Psychiatric/Behavioral: Negative for suicidal ideas, confusion and sleep disturbance. The patient is not nervous/anxious.        Objective:   Physical Exam  Constitutional: She appears well-developed. No distress.  Very thin   HENT:  Head: Normocephalic.  Right Ear: External ear normal.  Left Ear: External ear normal.  Nose: Nose normal.  Mouth/Throat: Oropharynx is clear and moist.  Eyes: Conjunctivae are normal. Pupils are equal, round, and reactive to light. Right eye exhibits no discharge. Left eye exhibits no discharge.  Neck: Normal range of motion. Neck supple. No JVD present. No tracheal deviation present. No thyromegaly present.  Cardiovascular: Normal rate, regular rhythm and normal heart sounds.   Pulmonary/Chest: No stridor. No respiratory distress. She  has no wheezes.  Abdominal: Soft. Bowel sounds are normal. She exhibits no distension and no mass. There is no tenderness. There is no rebound and no guarding.  Genitourinary: Guaiac negative stool. No vaginal discharge found.  Musculoskeletal: She exhibits edema (1+). She exhibits no tenderness.  Lymphadenopathy:    She has no cervical adenopathy.  Neurological: She displays normal reflexes. No cranial nerve deficit. She exhibits normal muscle tone. Coordination normal.  Skin: No rash noted. No erythema.  Psychiatric: She has a normal mood and affect. Her behavior is normal. Judgment and thought content normal.   Lab Results  Component Value Date   WBC 7.0 10/25/2012   HGB 12.2 10/25/2012   HCT 37.0 10/25/2012   PLT 288 10/25/2012   GLUCOSE 257* 01/31/2013   CHOL 289* 09/29/2010   TRIG 91.0 09/29/2010   HDL 62.80 09/29/2010   LDLDIRECT 207.8 09/29/2010   ALT 6 01/29/2012   AST 9 01/29/2012   NA 139 01/31/2013   K 5.4* 01/31/2013   CL 99 01/31/2013   CREATININE 1.0 01/31/2013   BUN 24* 01/31/2013   CO2 34* 01/31/2013   TSH 1.743 01/29/2012   HGBA1C 9.2* 01/31/2013          Assessment & Plan:

## 2013-05-06 NOTE — Assessment & Plan Note (Signed)
Resolved

## 2013-05-06 NOTE — Assessment & Plan Note (Signed)
Continue with current prescription therapy as reflected on the Med list.  

## 2013-05-06 NOTE — Progress Notes (Signed)
Pre visit review using our clinic review tool, if applicable. No additional management support is needed unless otherwise documented below in the visit note. 

## 2013-05-06 NOTE — Assessment & Plan Note (Signed)
Reduce Norvasc to 5 mg/d

## 2013-05-06 NOTE — Assessment & Plan Note (Signed)
Tramadol prn 

## 2013-05-27 ENCOUNTER — Other Ambulatory Visit: Payer: Self-pay | Admitting: *Deleted

## 2013-05-27 MED ORDER — AMLODIPINE BESYLATE 10 MG PO TABS: 5.0000 mg | ORAL_TABLET | Freq: Every day | ORAL | Status: DC

## 2013-05-27 MED ORDER — BENAZEPRIL HCL 40 MG PO TABS: 40.0000 mg | ORAL_TABLET | Freq: Every day | ORAL | Status: DC

## 2013-05-31 ENCOUNTER — Other Ambulatory Visit: Payer: Self-pay | Admitting: Internal Medicine

## 2013-06-26 ENCOUNTER — Other Ambulatory Visit: Payer: Self-pay | Admitting: Internal Medicine

## 2013-07-10 ENCOUNTER — Ambulatory Visit (INDEPENDENT_AMBULATORY_CARE_PROVIDER_SITE_OTHER): Payer: Self-pay | Admitting: Internal Medicine

## 2013-07-10 ENCOUNTER — Ambulatory Visit: Payer: Self-pay | Admitting: Internal Medicine

## 2013-07-10 ENCOUNTER — Encounter: Payer: Self-pay | Admitting: Internal Medicine

## 2013-07-10 VITALS — BP 140/70 | HR 72 | Temp 98.3°F | Resp 16 | Wt 127.0 lb

## 2013-07-10 DIAGNOSIS — I10 Essential (primary) hypertension: Secondary | ICD-10-CM

## 2013-07-10 DIAGNOSIS — G459 Transient cerebral ischemic attack, unspecified: Secondary | ICD-10-CM

## 2013-07-10 DIAGNOSIS — R635 Abnormal weight gain: Secondary | ICD-10-CM

## 2013-07-10 DIAGNOSIS — R609 Edema, unspecified: Secondary | ICD-10-CM

## 2013-07-10 DIAGNOSIS — E119 Type 2 diabetes mellitus without complications: Secondary | ICD-10-CM

## 2013-07-10 DIAGNOSIS — R634 Abnormal weight loss: Secondary | ICD-10-CM

## 2013-07-10 DIAGNOSIS — Z0289 Encounter for other administrative examinations: Secondary | ICD-10-CM

## 2013-07-10 NOTE — Progress Notes (Signed)
Pre visit review using our clinic review tool, if applicable. No additional management support is needed unless otherwise documented below in the visit note. 

## 2013-07-10 NOTE — Assessment & Plan Note (Signed)
Worse Dr Cruzita Lederer today appt

## 2013-07-10 NOTE — Assessment & Plan Note (Signed)
Better  

## 2013-07-10 NOTE — Progress Notes (Signed)
   Subjective:    HPI C/o high glu - she was admitted to [facility] for a CVA/TIA/HA clinically (per pt tests did not confirm) F/u on LBP, L hip pain - 5-7/10 on 3 Tramadols a day now.  She started to gain wt. LBP is better. She is unable to walk far or to stand >5 min F/u DM2, HTN, GERD  She is seeing  Herbalist now for "clensing"     BP Readings from Last 3 Encounters:  07/10/13 140/70  05/06/13 112/72  01/31/13 188/78   Wt Readings from Last 3 Encounters:  07/10/13 127 lb (57.607 kg)  05/06/13 111 lb (50.349 kg)  01/31/13 112 lb (50.803 kg)       Review of Systems  Constitutional: Positive for unexpected weight change. Negative for chills, activity change, appetite change and fatigue.  HENT: Negative for congestion, mouth sores and sinus pressure.   Eyes: Negative for visual disturbance.  Respiratory: Negative for cough and chest tightness.   Cardiovascular: Positive for leg swelling.  Gastrointestinal: Negative for nausea.  Genitourinary: Negative for frequency, difficulty urinating and vaginal pain.  Musculoskeletal: Positive for arthralgias and back pain. Negative for gait problem.  Skin: Negative for pallor and rash.  Neurological: Negative for dizziness, tremors, syncope and weakness.  Psychiatric/Behavioral: Negative for suicidal ideas, confusion and sleep disturbance. The patient is not nervous/anxious.        Objective:   Physical Exam  Constitutional: She appears well-developed. No distress.  Very thin   HENT:  Head: Normocephalic.  Right Ear: External ear normal.  Left Ear: External ear normal.  Nose: Nose normal.  Mouth/Throat: Oropharynx is clear and moist.  Eyes: Conjunctivae are normal. Pupils are equal, round, and reactive to light. Right eye exhibits no discharge. Left eye exhibits no discharge.  Neck: Normal range of motion. Neck supple. No JVD present. No tracheal deviation present. No thyromegaly present.  Cardiovascular: Normal rate,  regular rhythm and normal heart sounds.   Pulmonary/Chest: No stridor. No respiratory distress. She has no wheezes.  Abdominal: Soft. Bowel sounds are normal. She exhibits no distension and no mass. There is no tenderness. There is no rebound and no guarding.  Genitourinary: Guaiac negative stool. No vaginal discharge found.  Musculoskeletal: She exhibits edema (1+). She exhibits no tenderness.  Lymphadenopathy:    She has no cervical adenopathy.  Neurological: She displays normal reflexes. No cranial nerve deficit. She exhibits normal muscle tone. Coordination normal.  Skin: No rash noted. No erythema.  Psychiatric: She has a normal mood and affect. Her behavior is normal. Judgment and thought content normal.   Lab Results  Component Value Date   WBC 7.0 10/25/2012   HGB 12.2 10/25/2012   HCT 37.0 10/25/2012   PLT 288 10/25/2012   GLUCOSE 257* 01/31/2013   CHOL 289* 09/29/2010   TRIG 91.0 09/29/2010   HDL 62.80 09/29/2010   LDLDIRECT 207.8 09/29/2010   ALT 6 01/29/2012   AST 9 01/29/2012   NA 139 01/31/2013   K 5.4* 01/31/2013   CL 99 01/31/2013   CREATININE 1.0 01/31/2013   BUN 24* 01/31/2013   CO2 34* 01/31/2013   TSH 1.743 01/29/2012   HGBA1C 9.2* 01/31/2013          Assessment & Plan:

## 2013-07-10 NOTE — Assessment & Plan Note (Signed)
Continue with current prescription therapy as reflected on the Med list.  

## 2013-07-10 NOTE — Assessment & Plan Note (Signed)
Wt Readings from Last 3 Encounters:  07/10/13 127 lb (57.607 kg)  05/06/13 111 lb (50.349 kg)  01/31/13 112 lb (50.803 kg)

## 2013-07-10 NOTE — Assessment & Plan Note (Signed)
1/15 migraine related:  she was admitted to [facility] for a CVA/TIA/HA clinically (per pt tests did not confirm) Continue with current prescription therapy as reflected on the Med list.

## 2013-07-11 ENCOUNTER — Telehealth: Payer: Self-pay

## 2013-07-11 NOTE — Telephone Encounter (Signed)
Relevant patient education assigned to patient using Emmi. ° °

## 2013-08-05 ENCOUNTER — Encounter: Payer: Self-pay | Admitting: Internal Medicine

## 2013-08-05 ENCOUNTER — Ambulatory Visit (INDEPENDENT_AMBULATORY_CARE_PROVIDER_SITE_OTHER): Payer: No Typology Code available for payment source | Admitting: Internal Medicine

## 2013-08-05 VITALS — BP 170/82 | HR 76 | Temp 98.9°F | Resp 16 | Wt 137.0 lb

## 2013-08-05 DIAGNOSIS — E119 Type 2 diabetes mellitus without complications: Secondary | ICD-10-CM

## 2013-08-05 DIAGNOSIS — M545 Low back pain, unspecified: Secondary | ICD-10-CM

## 2013-08-05 DIAGNOSIS — R609 Edema, unspecified: Secondary | ICD-10-CM

## 2013-08-05 DIAGNOSIS — I10 Essential (primary) hypertension: Secondary | ICD-10-CM

## 2013-08-05 DIAGNOSIS — R634 Abnormal weight loss: Secondary | ICD-10-CM

## 2013-08-05 MED ORDER — SITAGLIP PHOS-METFORMIN HCL ER 50-1000 MG PO TB24: ORAL_TABLET | ORAL | Status: DC

## 2013-08-05 MED ORDER — FUROSEMIDE 20 MG PO TABS: 20.0000 mg | ORAL_TABLET | Freq: Every day | ORAL | Status: DC | PRN

## 2013-08-05 NOTE — Assessment & Plan Note (Signed)
D/c Jantadueto due to diarrhea Start janumet XR 50/1000 qd

## 2013-08-05 NOTE — Assessment & Plan Note (Signed)
Resolved

## 2013-08-05 NOTE — Progress Notes (Signed)
Pre visit review using our clinic review tool, if applicable. No additional management support is needed unless otherwise documented below in the visit note. 

## 2013-08-05 NOTE — Assessment & Plan Note (Signed)
Chronic - she increased Norvasc to 10 mg again - cut back to 1/2 Continue with current prescription therapy as reflected on the Med list prn

## 2013-08-05 NOTE — Assessment & Plan Note (Signed)
Risks associated with treatment noncompliance were discussed. Compliance was encouraged. 

## 2013-08-05 NOTE — Progress Notes (Signed)
   Subjective:    HPI F/u CVA/TIA/HA clinically (per pt tests did not confirm) F/u on LBP, L hip pain - 5-7/10 on 3 Tramadols a day now.  She started to gain wt. LBP is better. She is unable to walk far or to stand >5 min F/u DM2, HTN, GERD C/o loose stools and incontinence w/1000 mg Metformin bid...  She is seeing  Herbalist now for "clensing"  CBGs 140s on Ins 18 u     BP Readings from Last 3 Encounters:  08/05/13 170/82  07/10/13 140/70  05/06/13 112/72   Wt Readings from Last 3 Encounters:  08/05/13 137 lb (62.143 kg)  07/10/13 127 lb (57.607 kg)  05/06/13 111 lb (50.349 kg)       Review of Systems  Constitutional: Positive for unexpected weight change. Negative for chills, activity change, appetite change and fatigue.  HENT: Negative for congestion, mouth sores and sinus pressure.   Eyes: Negative for visual disturbance.  Respiratory: Negative for cough and chest tightness.   Cardiovascular: Positive for leg swelling.  Gastrointestinal: Negative for nausea.  Genitourinary: Negative for frequency, difficulty urinating and vaginal pain.  Musculoskeletal: Positive for arthralgias and back pain. Negative for gait problem.  Skin: Negative for pallor and rash.  Neurological: Negative for dizziness, tremors, syncope and weakness.  Psychiatric/Behavioral: Negative for suicidal ideas, confusion and sleep disturbance. The patient is not nervous/anxious.        Objective:   Physical Exam  Constitutional: She appears well-developed. No distress.  Very thin   HENT:  Head: Normocephalic.  Right Ear: External ear normal.  Left Ear: External ear normal.  Nose: Nose normal.  Mouth/Throat: Oropharynx is clear and moist.  Eyes: Conjunctivae are normal. Pupils are equal, round, and reactive to light. Right eye exhibits no discharge. Left eye exhibits no discharge.  Neck: Normal range of motion. Neck supple. No JVD present. No tracheal deviation present. No thyromegaly  present.  Cardiovascular: Normal rate, regular rhythm and normal heart sounds.   Pulmonary/Chest: No stridor. No respiratory distress. She has no wheezes.  Abdominal: Soft. Bowel sounds are normal. She exhibits no distension and no mass. There is no tenderness. There is no rebound and no guarding.  Genitourinary: Guaiac negative stool. No vaginal discharge found.  Musculoskeletal: She exhibits edema (1+). She exhibits no tenderness.  Lymphadenopathy:    She has no cervical adenopathy.  Neurological: She displays normal reflexes. No cranial nerve deficit. She exhibits normal muscle tone. Coordination normal.  Skin: No rash noted. No erythema.  Psychiatric: She has a normal mood and affect. Her behavior is normal. Judgment and thought content normal.  1+ B edema Lab Results  Component Value Date   WBC 7.0 10/25/2012   HGB 12.2 10/25/2012   HCT 37.0 10/25/2012   PLT 288 10/25/2012   GLUCOSE 257* 01/31/2013   CHOL 289* 09/29/2010   TRIG 91.0 09/29/2010   HDL 62.80 09/29/2010   LDLDIRECT 207.8 09/29/2010   ALT 6 01/29/2012   AST 9 01/29/2012   NA 139 01/31/2013   K 5.4* 01/31/2013   CL 99 01/31/2013   CREATININE 1.0 01/31/2013   BUN 24* 01/31/2013   CO2 34* 01/31/2013   TSH 1.743 01/29/2012   HGBA1C 9.2* 01/31/2013          Assessment & Plan:

## 2013-08-12 ENCOUNTER — Encounter: Payer: Self-pay | Admitting: Internal Medicine

## 2013-08-18 ENCOUNTER — Other Ambulatory Visit: Payer: Self-pay | Admitting: *Deleted

## 2013-08-18 DIAGNOSIS — R609 Edema, unspecified: Secondary | ICD-10-CM

## 2013-08-18 MED ORDER — FUROSEMIDE 20 MG PO TABS: 20.0000 mg | ORAL_TABLET | Freq: Every day | ORAL | Status: DC | PRN

## 2013-08-25 ENCOUNTER — Other Ambulatory Visit: Payer: Self-pay | Admitting: *Deleted

## 2013-08-25 MED ORDER — SITAGLIP PHOS-METFORMIN HCL ER 50-1000 MG PO TB24: ORAL_TABLET | ORAL | Status: DC

## 2013-09-30 ENCOUNTER — Ambulatory Visit (INDEPENDENT_AMBULATORY_CARE_PROVIDER_SITE_OTHER): Payer: No Typology Code available for payment source | Admitting: Internal Medicine

## 2013-09-30 ENCOUNTER — Encounter: Payer: Self-pay | Admitting: Internal Medicine

## 2013-09-30 VITALS — BP 172/78 | HR 76 | Temp 98.3°F | Resp 16 | Wt 139.0 lb

## 2013-09-30 DIAGNOSIS — I10 Essential (primary) hypertension: Secondary | ICD-10-CM

## 2013-09-30 DIAGNOSIS — E119 Type 2 diabetes mellitus without complications: Secondary | ICD-10-CM

## 2013-09-30 DIAGNOSIS — G459 Transient cerebral ischemic attack, unspecified: Secondary | ICD-10-CM

## 2013-09-30 DIAGNOSIS — R000 Tachycardia, unspecified: Secondary | ICD-10-CM

## 2013-09-30 NOTE — Progress Notes (Signed)
Pre visit review using our clinic review tool, if applicable. No additional management support is needed unless otherwise documented below in the visit note. 

## 2013-09-30 NOTE — Progress Notes (Signed)
   Subjective:    HPI  F/u CVA/TIA/HA clinically (per pt tests did not confirm) F/u on LBP, L hip pain - well on 0 Tramadols a day now.  She started to gain wt. LBP is better as above. She is unable to walk far or to stand >180 min now C/o ankle swelling F/u DM2, HTN, GERD C/o loose stools and incontinence w/1000 mg Metformin bid...-- better on janumet xr  She is seeing  Herbalist now for "clensing"  CBGs 140s on Ins 15 u Lantus; CBG 200 on a bad day     BP Readings from Last 3 Encounters:  09/30/13 172/78  08/05/13 170/82  07/10/13 140/70   Wt Readings from Last 3 Encounters:  09/30/13 139 lb (63.05 kg)  08/05/13 137 lb (62.143 kg)  07/10/13 127 lb (57.607 kg)       Review of Systems  Constitutional: Positive for unexpected weight change. Negative for chills, activity change, appetite change and fatigue.  HENT: Negative for congestion, mouth sores and sinus pressure.   Eyes: Negative for visual disturbance.  Respiratory: Negative for cough and chest tightness.   Cardiovascular: Positive for leg swelling.  Gastrointestinal: Negative for nausea.  Genitourinary: Negative for frequency, difficulty urinating and vaginal pain.  Musculoskeletal: Positive for arthralgias and back pain. Negative for gait problem.  Skin: Negative for pallor and rash.  Neurological: Negative for dizziness, tremors, syncope and weakness.  Psychiatric/Behavioral: Negative for suicidal ideas, confusion and sleep disturbance. The patient is not nervous/anxious.        Objective:   Physical Exam  Constitutional: She appears well-developed. No distress.  Very thin   HENT:  Head: Normocephalic.  Right Ear: External ear normal.  Left Ear: External ear normal.  Nose: Nose normal.  Mouth/Throat: Oropharynx is clear and moist.  Eyes: Conjunctivae are normal. Pupils are equal, round, and reactive to light. Right eye exhibits no discharge. Left eye exhibits no discharge.  Neck: Normal range of  motion. Neck supple. No JVD present. No tracheal deviation present. No thyromegaly present.  Cardiovascular: Normal rate, regular rhythm and normal heart sounds.   Pulmonary/Chest: No stridor. No respiratory distress. She has no wheezes.  Abdominal: Soft. Bowel sounds are normal. She exhibits no distension and no mass. There is no tenderness. There is no rebound and no guarding.  Genitourinary: Guaiac negative stool. No vaginal discharge found.  Musculoskeletal: She exhibits edema (1+). She exhibits no tenderness.  Lymphadenopathy:    She has no cervical adenopathy.  Neurological: She displays normal reflexes. No cranial nerve deficit. She exhibits normal muscle tone. Coordination normal.  Skin: No rash noted. No erythema.  Psychiatric: She has a normal mood and affect. Her behavior is normal. Judgment and thought content normal.  1+ B edema Lab Results  Component Value Date   WBC 7.0 10/25/2012   HGB 12.2 10/25/2012   HCT 37.0 10/25/2012   PLT 288 10/25/2012   GLUCOSE 257* 01/31/2013   CHOL 289* 09/29/2010   TRIG 91.0 09/29/2010   HDL 62.80 09/29/2010   LDLDIRECT 207.8 09/29/2010   ALT 6 01/29/2012   AST 9 01/29/2012   NA 139 01/31/2013   K 5.4* 01/31/2013   CL 99 01/31/2013   CREATININE 1.0 01/31/2013   BUN 24* 01/31/2013   CO2 34* 01/31/2013   TSH 1.743 01/29/2012   HGBA1C 9.2* 01/31/2013          Assessment & Plan:

## 2013-09-30 NOTE — Assessment & Plan Note (Signed)
Continue with current prescription therapy as reflected on the Med list.  

## 2013-09-30 NOTE — Assessment & Plan Note (Signed)
No relapse 

## 2013-09-30 NOTE — Assessment & Plan Note (Signed)
Better  

## 2013-09-30 NOTE — Assessment & Plan Note (Signed)
Continue with current prescription therapy as reflected on the Med list. Better BP at home

## 2013-10-01 ENCOUNTER — Telehealth: Payer: Self-pay | Admitting: Internal Medicine

## 2013-10-01 NOTE — Telephone Encounter (Signed)
Relevant patient education assigned to patient using Emmi. ° °

## 2013-10-13 ENCOUNTER — Telehealth: Payer: Self-pay

## 2013-10-13 NOTE — Telephone Encounter (Signed)
Relevant patient education assigned to patient using Emmi. ° °

## 2013-11-13 ENCOUNTER — Other Ambulatory Visit: Payer: Self-pay | Admitting: Internal Medicine

## 2013-11-17 ENCOUNTER — Encounter: Payer: Self-pay | Admitting: Internal Medicine

## 2013-11-18 MED ORDER — AMLODIPINE BESYLATE 10 MG PO TABS: 10.0000 mg | ORAL_TABLET | Freq: Every day | ORAL | Status: DC

## 2013-11-24 ENCOUNTER — Encounter: Payer: Self-pay | Admitting: Internal Medicine

## 2013-11-26 ENCOUNTER — Other Ambulatory Visit: Payer: Self-pay | Admitting: *Deleted

## 2013-11-26 MED ORDER — INSULIN GLARGINE 100 UNIT/ML SOLOSTAR PEN: 18.0000 [IU] | PEN_INJECTOR | Freq: Every day | SUBCUTANEOUS | Status: DC

## 2013-11-27 ENCOUNTER — Other Ambulatory Visit: Payer: Self-pay | Admitting: *Deleted

## 2013-11-27 MED ORDER — INSULIN GLARGINE 100 UNIT/ML SOLOSTAR PEN
18.0000 [IU] | PEN_INJECTOR | Freq: Every day | SUBCUTANEOUS | Status: DC
Start: 2013-11-27 — End: 2013-12-26

## 2013-11-27 MED ORDER — SITAGLIP PHOS-METFORMIN HCL ER 50-1000 MG PO TB24: ORAL_TABLET | ORAL | Status: DC

## 2013-11-27 NOTE — Telephone Encounter (Signed)
Sent email wanting samples of lantus solostar. No samples available sending pt rx to [facility]...Mary Valencia

## 2013-12-03 ENCOUNTER — Encounter: Payer: Self-pay | Admitting: Internal Medicine

## 2013-12-03 ENCOUNTER — Ambulatory Visit (INDEPENDENT_AMBULATORY_CARE_PROVIDER_SITE_OTHER): Payer: No Typology Code available for payment source | Admitting: Internal Medicine

## 2013-12-03 VITALS — BP 122/74 | HR 62 | Temp 98.0°F | Resp 12 | Wt 137.0 lb

## 2013-12-03 DIAGNOSIS — E119 Type 2 diabetes mellitus without complications: Secondary | ICD-10-CM

## 2013-12-03 LAB — BASIC METABOLIC PANEL
BUN: 27 mg/dL — ABNORMAL HIGH (ref 6–23)
CALCIUM: 10.1 mg/dL (ref 8.4–10.5)
CO2: 29 mEq/L (ref 19–32)
Chloride: 105 mEq/L (ref 96–112)
Creatinine, Ser: 1.1 mg/dL (ref 0.4–1.2)
GFR: 63.28 mL/min (ref >60.00)
Glucose, Bld: 245 mg/dL — ABNORMAL HIGH (ref 70–99)
Potassium: 5.3 mEq/L — ABNORMAL HIGH (ref 3.5–5.1)
Sodium: 141 mEq/L (ref 135–145)

## 2013-12-03 LAB — HEMOGLOBIN A1C: HEMOGLOBIN A1C: 14.1 % — AB (ref 4.6–6.5)

## 2013-12-03 NOTE — Patient Instructions (Signed)
Please continue JanuMet XR 50/1000 mg in am. Decrease Lantus to 16 units at bedtime. Please return in 1 month with your sugar log.  Please stop at the lab.

## 2013-12-03 NOTE — Progress Notes (Signed)
Patient ID: Mary Valencia, female   DOB: 01/02/1956, 58 y.o.   MRN: [id number]  HPI: Mary Valencia is a 58 y.o.-year-old female, returning for f/u for DM2, dx 2008 (prev. GDM in 1986), insulin-dependent since 2013, uncontrolled, with complications (cerebro-vascular ds.- h/o TIA, peripheral neuropathy). Last visit 13 mo ago! She has insurance now.  She was hospitalization in 07/2013 >> CBG >600 >> started on Lantu on d/c.  Reviewed hx: In 2013 >> hyperglycemia with AMS. After this, she moved to [place], lost 130 lbs in 4-5 years - initially intentional, then the last 70 lbs unintentionally. In summer 2013, she weighed ~140 lbs. She had a TIA in 2013, too.  Last hemoglobin A1c was: Lab Results  Component Value Date   HGBA1C 9.2* 01/31/2013  - previously 8.7%  Pt is on a regimen of: - JanuMet XR 50/1000 in am - Lantus 15-18 units in HS - restarted after last hospitalization in 07/2013 (CBG 600) She stopped Jentadueto (Linagliptin-Metformin) 2.10-998 bid  She cannot afford any type of meds, and is only using samples.   Pt checks her sugars 1x a day and they are: - am: 120-129 >> 90-120 (350) - improved in last 2 mo from 200s Depending on what she has for snack the night before. No lows recently, lowest 80; she has hypoglycemia awareness at 70. Highest sugar was 350.  She has had nutrition advice at dx, but tells me she cannot afford better food. Pt's meals are: - Breakfast: oatmeal (instant) maple + brown sugar, or fried potatoes - Lunch: sandwich - Dinner: chicken or [place] sandwich - Snacks: 3 She walks daily for exercise.  She is waiting to get an answer from a job.  Pt does not have chronic kidney disease, last BUN/creatinine was:  Lab Results  Component Value Date   BUN 24* 01/31/2013   CREATININE 1.0 01/31/2013  She started Benazepril. Last set of lipids: Lab Results  Component Value Date   CHOL 289* 09/29/2010   HDL 62.80 09/29/2010   LDLDIRECT 207.8 09/29/2010   TRIG 91.0  09/29/2010   CHOLHDL 5 09/29/2010  She was taking lovastatin or pravastatin >> could not afford.  - Pt's last eye exam was in 07/2013. No DR - reportedly.  - Has numbness and tingling in her legs, also has burning of her soles.  I reviewed her chart and she also has a history of hypertension, hyperlipidemia, low back pain, and left hip pain, GERD, atypical chest pain, edema.  She had uninephrectomy in 1982. In 1980, she was pregnant with her son and got strep throat >> developed kidney infection >> ABx for a long course >> urologist >> R kidney stopped fxn-ing, also had 3 kidney stones in it >> nephrectomy.  I reviewed pt's medications, allergies, PMH, social hx, family hx and no changes required, except as mentioned above. She has leg swelling >> tried 5 mg Norvasc but BP increased >> back to 10 mg.  ROS: Constitutional: + weight gain, no fatigue, no subjective hyperthermia/hypothermia, + nocturia Eyes: no blurry vision, no xerophthalmia ENT: no sore throat, no nodules palpated in throat, no dysphagia/odynophagia, no hoarseness Cardiovascular: no CP/SOB/palpitations/+ leg swelling Respiratory: no cough/SOB Gastrointestinal: no N/V/D/C Musculoskeletal: no muscle/joint aches Skin: no rashes,  Neurological:no tremors/numbness /no tingling/dizziness  PE: BP 122/74  Pulse 62  Temp(Src) 98 F (36.7 C) (Oral)  Resp 12  Wt 137 lb (62.143 kg)  SpO2 96% Body mass index is 24.64 kg/(m^2).  Wt Readings from Last 3 Encounters:  12/03/13 137 lb (62.143 kg)  09/30/13 139 lb (63.05 kg)  08/05/13 137 lb (62.143 kg)   Constitutional: normal weight, in NAD Eyes: PERRLA, EOMI, mild exophthalmos, no lid lag, no stare ENT: moist mucous membranes, no thyromegaly, no cervical lymphadenopathy Cardiovascular: RRR, No MRG Respiratory: CTA B Gastrointestinal: abdomen soft, NT, ND, BS+ Musculoskeletal: no deformities, strength intact in all 4 Skin: moist, warm, no rashes Neurological: no tremor with  outstretched hands, DTR normal in all 4  ASSESSMENT: 1. DM2, insulin-dependent, uncontrolled, with complications - Cerebrovascular ds, h/o TIA 2013 - peripheral neuropathy  PLAN:  1. Patient with improved CBG control after starting Lantus at bedtime, however, she does not check sugars later in the day and we do not have a recent A1c  - unclear how sugars evolve during the day - I advised her to: Patient Instructions  Please continue JanuMet XR 50/1000 mg in am. Decrease Lantus to 16 units at bedtime. Please return in 1 month with your sugar log.  Please stop at the lab. - given sugar log and advised how to fill it and to bring it at next appt - check CBGs 2x a day, rotating check times - would check a hemoglobin A1c and BMP today - up to date with eye exams - no microalbumin/creatinine ratio since she is on ACE inhibitors - I will see her back in a month with her sugar log  Office Visit on 12/03/2013  Component Date Value Ref Range Status  . Hemoglobin A1C 12/03/2013 14.1* 4.6 - 6.5 % Final   Glycemic Control Guidelines for People with Diabetes:Non Diabetic:  <6%Goal of Therapy: <7%Additional Action Suggested:  >8%   . Sodium 12/03/2013 141  135 - 145 mEq/L Final  . Potassium 12/03/2013 5.3* 3.5 - 5.1 mEq/L Final  . Chloride 12/03/2013 105  96 - 112 mEq/L Final  . CO2 12/03/2013 29  19 - 32 mEq/L Final  . Glucose, Bld 12/03/2013 245* 70 - 99 mg/dL Final  . BUN 12/03/2013 27* 6 - 23 mg/dL Final  . Creatinine, Ser 12/03/2013 1.1  0.4 - 1.2 mg/dL Final  . Calcium 12/03/2013 10.1  8.4 - 10.5 mg/dL Final  . GFR 12/03/2013 63.28  >60.00 mL/min Final   K a little high. She ran out of her K supplement >> continue off the supplement. HbA1c very high. Will see her in 1 mo with sugar log and see how we need to change the regimen. I suspect she needs mealtime insulin, but will have to go by the sugars.

## 2013-12-06 ENCOUNTER — Encounter: Payer: Self-pay | Admitting: Internal Medicine

## 2013-12-15 ENCOUNTER — Encounter: Payer: Self-pay | Admitting: Internal Medicine

## 2013-12-25 ENCOUNTER — Telehealth: Payer: Self-pay | Admitting: *Deleted

## 2013-12-25 MED ORDER — METOPROLOL TARTRATE 50 MG PO TABS: 50.0000 mg | ORAL_TABLET | Freq: Two times a day (BID) | ORAL | Status: DC

## 2013-12-25 MED ORDER — BENAZEPRIL HCL 40 MG PO TABS: 40.0000 mg | ORAL_TABLET | Freq: Every day | ORAL | Status: DC

## 2013-12-25 MED ORDER — AMLODIPINE BESYLATE 10 MG PO TABS: 10.0000 mg | ORAL_TABLET | Freq: Every day | ORAL | Status: DC

## 2013-12-25 NOTE — Telephone Encounter (Signed)
Rec fax stating   1. Lantus Solostar is not covered. Preferred alternatives are Levemir vial or Flexpen.  2. Janumet XR 50-1000 is not covered. Preferred alternatives are metforming, Glipizide and Glimerpiride.  Please advise.

## 2013-12-26 NOTE — Telephone Encounter (Signed)
Switch to Metformin and Levemir Thx

## 2013-12-29 ENCOUNTER — Telehealth: Payer: Self-pay | Admitting: *Deleted

## 2013-12-29 ENCOUNTER — Other Ambulatory Visit: Payer: Self-pay | Admitting: Internal Medicine

## 2013-12-29 DIAGNOSIS — E119 Type 2 diabetes mellitus without complications: Secondary | ICD-10-CM

## 2013-12-29 MED ORDER — INSULIN DETEMIR 100 UNIT/ML FLEXPEN: 18.0000 [IU] | PEN_INJECTOR | Freq: Every day | SUBCUTANEOUS | Status: DC

## 2013-12-29 MED ORDER — METFORMIN HCL ER 500 MG PO TB24: 1000.0000 mg | ORAL_TABLET | Freq: Every day | ORAL | Status: DC

## 2013-12-29 NOTE — Telephone Encounter (Signed)
Done. Pt informed.

## 2013-12-29 NOTE — Telephone Encounter (Signed)
Lipid panel added to next office visit - diabetic bundle.

## 2014-01-02 ENCOUNTER — Ambulatory Visit: Payer: No Typology Code available for payment source | Admitting: Internal Medicine

## 2014-01-30 ENCOUNTER — Ambulatory Visit: Payer: Self-pay | Admitting: Internal Medicine

## 2014-02-04 ENCOUNTER — Ambulatory Visit: Payer: No Typology Code available for payment source | Admitting: Internal Medicine

## 2014-02-04 DIAGNOSIS — Z0289 Encounter for other administrative examinations: Secondary | ICD-10-CM

## 2014-04-07 ENCOUNTER — Other Ambulatory Visit: Payer: Self-pay | Admitting: Internal Medicine

## 2014-07-14 ENCOUNTER — Encounter: Payer: Self-pay | Admitting: Internal Medicine

## 2014-08-20 ENCOUNTER — Encounter: Payer: Self-pay | Admitting: Internal Medicine

## 2014-08-20 ENCOUNTER — Other Ambulatory Visit: Payer: Self-pay | Admitting: *Deleted

## 2014-08-20 MED ORDER — INSULIN DETEMIR 100 UNIT/ML FLEXPEN: 18.0000 [IU] | PEN_INJECTOR | Freq: Every day | SUBCUTANEOUS | Status: DC

## 2014-08-20 NOTE — Telephone Encounter (Signed)
Pt sent email requesting refill on her Levemir insulin...Mary Valencia

## 2014-12-21 ENCOUNTER — Other Ambulatory Visit: Payer: Self-pay

## 2015-06-23 DIAGNOSIS — E559 Vitamin D deficiency, unspecified: Secondary | ICD-10-CM | POA: Diagnosis not present

## 2015-06-23 DIAGNOSIS — E8770 Fluid overload, unspecified: Secondary | ICD-10-CM | POA: Diagnosis not present

## 2015-06-23 DIAGNOSIS — I129 Hypertensive chronic kidney disease with stage 1 through stage 4 chronic kidney disease, or unspecified chronic kidney disease: Secondary | ICD-10-CM | POA: Diagnosis not present

## 2015-06-23 DIAGNOSIS — Q600 Renal agenesis, unilateral: Secondary | ICD-10-CM | POA: Diagnosis not present

## 2015-06-23 DIAGNOSIS — N183 Chronic kidney disease, stage 3 (moderate): Secondary | ICD-10-CM | POA: Diagnosis not present

## 2015-06-23 DIAGNOSIS — E1122 Type 2 diabetes mellitus with diabetic chronic kidney disease: Secondary | ICD-10-CM | POA: Diagnosis not present

## 2015-07-21 DIAGNOSIS — N183 Chronic kidney disease, stage 3 (moderate): Secondary | ICD-10-CM | POA: Diagnosis not present

## 2015-09-03 DIAGNOSIS — M9080 Osteopathy in diseases classified elsewhere, unspecified site: Secondary | ICD-10-CM | POA: Diagnosis not present

## 2015-09-03 DIAGNOSIS — I129 Hypertensive chronic kidney disease with stage 1 through stage 4 chronic kidney disease, or unspecified chronic kidney disease: Secondary | ICD-10-CM | POA: Diagnosis not present

## 2015-09-03 DIAGNOSIS — Q600 Renal agenesis, unilateral: Secondary | ICD-10-CM | POA: Diagnosis not present

## 2015-09-03 DIAGNOSIS — N184 Chronic kidney disease, stage 4 (severe): Secondary | ICD-10-CM | POA: Diagnosis not present

## 2015-09-03 DIAGNOSIS — M898X9 Other specified disorders of bone, unspecified site: Secondary | ICD-10-CM | POA: Insufficient documentation

## 2015-09-03 DIAGNOSIS — E1122 Type 2 diabetes mellitus with diabetic chronic kidney disease: Secondary | ICD-10-CM | POA: Diagnosis not present

## 2015-09-03 DIAGNOSIS — E559 Vitamin D deficiency, unspecified: Secondary | ICD-10-CM | POA: Insufficient documentation

## 2015-09-03 DIAGNOSIS — Z905 Acquired absence of kidney: Secondary | ICD-10-CM | POA: Insufficient documentation

## 2015-09-03 DIAGNOSIS — E889 Metabolic disorder, unspecified: Secondary | ICD-10-CM | POA: Diagnosis not present

## 2015-09-03 DIAGNOSIS — D631 Anemia in chronic kidney disease: Secondary | ICD-10-CM | POA: Diagnosis not present

## 2015-09-28 DIAGNOSIS — R0789 Other chest pain: Secondary | ICD-10-CM | POA: Diagnosis not present

## 2015-09-28 DIAGNOSIS — D508 Other iron deficiency anemias: Secondary | ICD-10-CM | POA: Diagnosis not present

## 2015-09-28 DIAGNOSIS — I5031 Acute diastolic (congestive) heart failure: Secondary | ICD-10-CM | POA: Diagnosis not present

## 2015-09-28 DIAGNOSIS — R05 Cough: Secondary | ICD-10-CM | POA: Diagnosis not present

## 2015-09-28 DIAGNOSIS — Z905 Acquired absence of kidney: Secondary | ICD-10-CM | POA: Diagnosis not present

## 2015-09-28 DIAGNOSIS — D649 Anemia, unspecified: Secondary | ICD-10-CM | POA: Diagnosis not present

## 2015-09-28 DIAGNOSIS — D631 Anemia in chronic kidney disease: Secondary | ICD-10-CM | POA: Diagnosis not present

## 2015-09-28 DIAGNOSIS — I5033 Acute on chronic diastolic (congestive) heart failure: Secondary | ICD-10-CM | POA: Diagnosis not present

## 2015-09-28 DIAGNOSIS — Z79899 Other long term (current) drug therapy: Secondary | ICD-10-CM | POA: Diagnosis not present

## 2015-09-28 DIAGNOSIS — I130 Hypertensive heart and chronic kidney disease with heart failure and stage 1 through stage 4 chronic kidney disease, or unspecified chronic kidney disease: Secondary | ICD-10-CM | POA: Diagnosis not present

## 2015-09-28 DIAGNOSIS — N184 Chronic kidney disease, stage 4 (severe): Secondary | ICD-10-CM | POA: Diagnosis not present

## 2015-09-28 DIAGNOSIS — Z882 Allergy status to sulfonamides status: Secondary | ICD-10-CM | POA: Diagnosis not present

## 2015-09-28 DIAGNOSIS — N179 Acute kidney failure, unspecified: Secondary | ICD-10-CM | POA: Diagnosis not present

## 2015-09-28 DIAGNOSIS — I129 Hypertensive chronic kidney disease with stage 1 through stage 4 chronic kidney disease, or unspecified chronic kidney disease: Secondary | ICD-10-CM | POA: Diagnosis not present

## 2015-09-28 DIAGNOSIS — E611 Iron deficiency: Secondary | ICD-10-CM | POA: Diagnosis not present

## 2015-09-28 DIAGNOSIS — N183 Chronic kidney disease, stage 3 (moderate): Secondary | ICD-10-CM | POA: Diagnosis not present

## 2015-09-28 DIAGNOSIS — E1122 Type 2 diabetes mellitus with diabetic chronic kidney disease: Secondary | ICD-10-CM | POA: Diagnosis not present

## 2015-09-28 DIAGNOSIS — E8779 Other fluid overload: Secondary | ICD-10-CM | POA: Diagnosis not present

## 2015-09-28 DIAGNOSIS — D500 Iron deficiency anemia secondary to blood loss (chronic): Secondary | ICD-10-CM | POA: Diagnosis not present

## 2015-09-28 DIAGNOSIS — I10 Essential (primary) hypertension: Secondary | ICD-10-CM | POA: Diagnosis not present

## 2015-09-28 DIAGNOSIS — Z794 Long term (current) use of insulin: Secondary | ICD-10-CM | POA: Diagnosis not present

## 2015-09-28 DIAGNOSIS — N189 Chronic kidney disease, unspecified: Secondary | ICD-10-CM | POA: Diagnosis not present

## 2015-10-03 DIAGNOSIS — D509 Iron deficiency anemia, unspecified: Secondary | ICD-10-CM | POA: Insufficient documentation

## 2015-10-06 ENCOUNTER — Encounter: Payer: Self-pay | Admitting: Internal Medicine

## 2015-10-06 ENCOUNTER — Ambulatory Visit (INDEPENDENT_AMBULATORY_CARE_PROVIDER_SITE_OTHER): Payer: Commercial Managed Care - HMO | Admitting: Internal Medicine

## 2015-10-06 VITALS — BP 168/85 | HR 75 | Ht 62.0 in | Wt 156.0 lb

## 2015-10-06 DIAGNOSIS — M5442 Lumbago with sciatica, left side: Secondary | ICD-10-CM

## 2015-10-06 DIAGNOSIS — Z794 Long term (current) use of insulin: Secondary | ICD-10-CM

## 2015-10-06 DIAGNOSIS — F4323 Adjustment disorder with mixed anxiety and depressed mood: Secondary | ICD-10-CM

## 2015-10-06 DIAGNOSIS — I10 Essential (primary) hypertension: Secondary | ICD-10-CM | POA: Diagnosis not present

## 2015-10-06 DIAGNOSIS — G459 Transient cerebral ischemic attack, unspecified: Secondary | ICD-10-CM

## 2015-10-06 DIAGNOSIS — N183 Chronic kidney disease, stage 3 unspecified: Secondary | ICD-10-CM

## 2015-10-06 DIAGNOSIS — E118 Type 2 diabetes mellitus with unspecified complications: Secondary | ICD-10-CM | POA: Diagnosis not present

## 2015-10-06 DIAGNOSIS — N189 Chronic kidney disease, unspecified: Secondary | ICD-10-CM | POA: Insufficient documentation

## 2015-10-06 DIAGNOSIS — Z992 Dependence on renal dialysis: Secondary | ICD-10-CM | POA: Insufficient documentation

## 2015-10-06 DIAGNOSIS — M5441 Lumbago with sciatica, right side: Secondary | ICD-10-CM | POA: Diagnosis not present

## 2015-10-06 DIAGNOSIS — N186 End stage renal disease: Secondary | ICD-10-CM | POA: Insufficient documentation

## 2015-10-06 DIAGNOSIS — R600 Localized edema: Secondary | ICD-10-CM

## 2015-10-06 MED ORDER — ESCITALOPRAM OXALATE 5 MG PO TABS: 5.0000 mg | ORAL_TABLET | Freq: Every day | ORAL | Status: DC

## 2015-10-06 NOTE — Assessment & Plan Note (Signed)
Not on ASA

## 2015-10-06 NOTE — Assessment & Plan Note (Signed)
CRF-3 On Insulin

## 2015-10-06 NOTE — Assessment & Plan Note (Addendum)
St 3 Nephrol appt w/labs on 10/16/15

## 2015-10-06 NOTE — Assessment & Plan Note (Addendum)
Chronic. Poor control, labile Better BP at home Nephrol appt w/labs on 10/16/15

## 2015-10-06 NOTE — Assessment & Plan Note (Signed)
4/17 Stress - Dtr w/breast ca Start Lexapro

## 2015-10-06 NOTE — Assessment & Plan Note (Signed)
4/17 B On Lasix

## 2015-10-06 NOTE — Assessment & Plan Note (Signed)
Doing fair 

## 2015-10-06 NOTE — Progress Notes (Signed)
Pre visit review using our clinic review tool, if applicable. No additional management support is needed unless otherwise documented below in the visit note. 

## 2015-10-06 NOTE — Progress Notes (Signed)
Subjective:  Patient ID: ADUT AEGERTER, female    DOB: 1955-12-15  Age: 60 y.o. MRN: [id number]  CC: No chief complaint on file.   HPI NELLY CRAUN presents for post-hosp f/u - pt was at [facility] for high BP and edema (d/c on 4/12) F/u HTN, DM, anxiety  Outpatient Prescriptions Prior to Visit  Medication Sig Dispense Refill  . amLODipine (NORVASC) 10 MG tablet Take 1 tablet (10 mg total) by mouth daily. 90 tablet 3  . aspirin 81 MG tablet Take 81 mg by mouth daily.    . Cholecalciferol (VITAMIN D-3) 5000 UNITS TABS Take 1 tablet by mouth daily.    Marland Kitchen AMITRIPTYLINE HCL PO Take by mouth at bedtime.    . B Complex-C (SUPER B COMPLEX) TABS Take 1 tablet by mouth daily.    . benazepril (LOTENSIN) 40 MG tablet Take 1 tablet (40 mg total) by mouth daily. 90 tablet 3  . benazepril (LOTENSIN) 40 MG tablet TAKE ONE TABLET BY MOUTH ONCE DAILY 30 tablet 5  . furosemide (LASIX) 20 MG tablet Take 1-2 tablets (20-40 mg total) by mouth daily as needed for edema. 60 tablet 3  . Insulin Detemir (LEVEMIR FLEXPEN) 100 UNIT/ML Pen Inject 18 Units into the skin daily. 45 mL 3  . KRILL OIL PO Take 1 capsule by mouth daily.    . metFORMIN (GLUCOPHAGE XR) 500 MG 24 hr tablet Take 2 tablets (1,000 mg total) by mouth daily with breakfast. 180 tablet 3  . metoprolol (LOPRESSOR) 50 MG tablet Take 1 tablet (50 mg total) by mouth 2 (two) times daily. 180 tablet 3  . metoprolol (LOPRESSOR) 50 MG tablet TAKE ONE TABLET BY MOUTH TWICE DAILY 60 tablet 5  . potassium chloride SA (K-DUR,KLOR-CON) 20 MEQ tablet Take 2 tablets (40 mEq total) by mouth daily. 180 tablet 1  . pravastatin (PRAVACHOL) 40 MG tablet TAKE ONE TABLET BY MOUTH ONCE DAILY 30 tablet 11  . tiZANidine (ZANAFLEX) 4 MG tablet Take 4 mg by mouth at bedtime.    . traMADol (ULTRAM) 50 MG tablet TAKE ONE TO TWO TABLETS BY MOUTH EVERY 8 HOURS AS NEEDED 120 tablet 2  . pantoprazole (PROTONIX) 40 MG tablet Take 1 tablet (40 mg total) by mouth daily. 30  tablet 11   No facility-administered medications prior to visit.    ROS Review of Systems  Constitutional: Positive for unexpected weight change. Negative for chills, activity change, appetite change and fatigue.  HENT: Negative for congestion, mouth sores and sinus pressure.   Eyes: Negative for visual disturbance.  Respiratory: Negative for cough and chest tightness.   Gastrointestinal: Negative for nausea and abdominal pain.  Genitourinary: Negative for frequency, difficulty urinating and vaginal pain.  Musculoskeletal: Negative for back pain and gait problem.  Skin: Negative for pallor and rash.  Neurological: Negative for dizziness, tremors, weakness, numbness and headaches.  Psychiatric/Behavioral: Positive for decreased concentration. Negative for suicidal ideas, confusion and sleep disturbance. The patient is nervous/anxious.     Objective:  BP 168/85 mmHg  Pulse 75  Ht 5\' 2"  (1.575 m)  Wt 156 lb (70.761 kg)  BMI 28.53 kg/m2  SpO2 96%  BP Readings from Last 3 Encounters:  10/06/15 168/85  12/03/13 122/74  09/30/13 172/78    Wt Readings from Last 3 Encounters:  10/06/15 156 lb (70.761 kg)  12/03/13 137 lb (62.143 kg)  09/30/13 139 lb (63.05 kg)    Physical Exam  Constitutional: She appears well-developed. No distress.  HENT:  Head: Normocephalic.  Right Ear: External ear normal.  Left Ear: External ear normal.  Nose: Nose normal.  Mouth/Throat: Oropharynx is clear and moist.  Eyes: Conjunctivae are normal. Pupils are equal, round, and reactive to light. Right eye exhibits no discharge. Left eye exhibits no discharge.  Neck: Normal range of motion. Neck supple. No JVD present. No tracheal deviation present. No thyromegaly present.  Cardiovascular: Normal rate, regular rhythm and normal heart sounds.   Pulmonary/Chest: No stridor. No respiratory distress. She has no wheezes.  Abdominal: Soft. Bowel sounds are normal. She exhibits no distension and no mass.  There is no tenderness. There is no rebound and no guarding.  Musculoskeletal: She exhibits edema. She exhibits no tenderness.  Lymphadenopathy:    She has no cervical adenopathy.  Neurological: She displays normal reflexes. No cranial nerve deficit. She exhibits normal muscle tone. Coordination normal.  Skin: No rash noted. No erythema.  Psychiatric: She has a normal mood and affect. Her behavior is normal. Judgment and thought content normal.    Lab Results  Component Value Date   WBC 7.0 10/25/2012   HGB 12.2 10/25/2012   HCT 37.0 10/25/2012   PLT 288 10/25/2012   GLUCOSE 245* 12/03/2013   CHOL 289* 09/29/2010   TRIG 91.0 09/29/2010   HDL 62.80 09/29/2010   LDLDIRECT 207.8 09/29/2010   ALT 6 01/29/2012   AST 9 01/29/2012   NA 141 12/03/2013   K 5.3* 12/03/2013   CL 105 12/03/2013   CREATININE 1.1 12/03/2013   BUN 27* 12/03/2013   CO2 29 12/03/2013   TSH 1.743 01/29/2012   HGBA1C 14.1* 12/03/2013    US Venous Img Lower Unilateral Right  10/25/2012  *RADIOLOGY REPORT* Clinical Data: Right lower calf/foot pain with edema RIGHT LOWER EXTREMITY VENOUS DUPLEX ULTRASOUND Technique:  Gray-scale sonography with graded compression, as well as color Doppler and duplex ultrasound, were performed to evaluate the deep venous system of the lower extremity from the level of the common femoral vein through the popliteal and proximal calf veins. Spectral Doppler was utilized to evaluate flow at rest and with distal augmentation maneuvers. Comparison:  None. Findings: The visualized right lower extremity deep venous system is patent. Normal compressibility.  Patent color Doppler flow.  Satisfactory spectral Doppler with respiratory variation and response to augmentation. The greater saphenous vein, where visualized, is patent and compressible. IMPRESSION: No deep venous thrombosis in the visualized right lower extremity. Original Report Authenticated By: Julian Hy, M.D.   US Venous Img Lower  Unilateral Right  10/25/2012  This is the back office imaging final text. See progress note for physician's narrative and interpretation.    Assessment & Plan:   There are no diagnoses linked to this encounter. I have discontinued Ms. Gladhill's SUPER B COMPLEX, KRILL OIL PO, pantoprazole, potassium chloride SA, AMITRIPTYLINE HCL PO, tiZANidine, traMADol, pravastatin, metoprolol, benazepril, metFORMIN, metoprolol, benazepril, and Insulin Detemir. I am also having her maintain her aspirin, Vitamin D-3, amLODipine, labetalol, hydrALAZINE, isosorbide mononitrate, CALCIUM-MAGNESIUM-VITAMIN D PO, ferrous sulfate, furosemide, insulin NPH-regular Human, amLODipine, and diphenhydrAMINE.  Meds ordered this encounter  Medications  . labetalol (NORMODYNE) 200 MG tablet    Sig: Take 200 mg by mouth 3 (three) times daily.  . hydrALAZINE (APRESOLINE) 100 MG tablet    Sig: Take 100 mg by mouth 3 (three) times daily.  . isosorbide mononitrate (IMDUR) 60 MG 24 hr tablet    Sig: Take 60 mg by mouth daily.  Marland Kitchen CALCIUM-MAGNESIUM-VITAMIN D PO    Sig: Take  1 tablet by mouth daily.  . ferrous sulfate 325 (65 FE) MG tablet    Sig: Take 325 mg by mouth 2 (two) times daily.  . furosemide (LASIX) 40 MG tablet    Sig: Take 40 mg by mouth daily.  . insulin NPH-regular Human (NOVOLIN 70/30) (70-30) 100 UNIT/ML injection    Sig: Inject 10 Units into the skin 2 (two) times daily.  Marland Kitchen amLODipine (NORVASC) 5 MG tablet    Sig: Take 5 mg by mouth daily.  . diphenhydrAMINE (BENADRYL) 25 mg capsule    Sig: Take 25 mg by mouth as needed.     Follow-up: No Follow-up on file.  Walker Kehr, MD

## 2015-10-15 DIAGNOSIS — I129 Hypertensive chronic kidney disease with stage 1 through stage 4 chronic kidney disease, or unspecified chronic kidney disease: Secondary | ICD-10-CM | POA: Diagnosis not present

## 2015-10-15 DIAGNOSIS — N184 Chronic kidney disease, stage 4 (severe): Secondary | ICD-10-CM | POA: Diagnosis not present

## 2015-10-15 DIAGNOSIS — Z905 Acquired absence of kidney: Secondary | ICD-10-CM | POA: Diagnosis not present

## 2015-10-15 DIAGNOSIS — D631 Anemia in chronic kidney disease: Secondary | ICD-10-CM | POA: Diagnosis not present

## 2015-10-19 DIAGNOSIS — D631 Anemia in chronic kidney disease: Secondary | ICD-10-CM | POA: Diagnosis not present

## 2015-10-19 DIAGNOSIS — N184 Chronic kidney disease, stage 4 (severe): Secondary | ICD-10-CM | POA: Diagnosis not present

## 2015-11-02 DIAGNOSIS — D631 Anemia in chronic kidney disease: Secondary | ICD-10-CM | POA: Diagnosis not present

## 2015-11-02 DIAGNOSIS — N184 Chronic kidney disease, stage 4 (severe): Secondary | ICD-10-CM | POA: Diagnosis not present

## 2015-11-19 ENCOUNTER — Ambulatory Visit: Payer: Commercial Managed Care - HMO | Admitting: Internal Medicine

## 2015-11-22 DIAGNOSIS — N184 Chronic kidney disease, stage 4 (severe): Secondary | ICD-10-CM | POA: Diagnosis not present

## 2015-11-22 DIAGNOSIS — D631 Anemia in chronic kidney disease: Secondary | ICD-10-CM | POA: Diagnosis not present

## 2015-11-25 ENCOUNTER — Ambulatory Visit: Payer: Commercial Managed Care - HMO | Admitting: Internal Medicine

## 2015-11-25 DIAGNOSIS — Z0289 Encounter for other administrative examinations: Secondary | ICD-10-CM

## 2016-03-10 DIAGNOSIS — N184 Chronic kidney disease, stage 4 (severe): Secondary | ICD-10-CM | POA: Diagnosis not present

## 2016-03-14 DIAGNOSIS — I129 Hypertensive chronic kidney disease with stage 1 through stage 4 chronic kidney disease, or unspecified chronic kidney disease: Secondary | ICD-10-CM | POA: Diagnosis not present

## 2016-03-14 DIAGNOSIS — Z905 Acquired absence of kidney: Secondary | ICD-10-CM | POA: Diagnosis not present

## 2016-03-14 DIAGNOSIS — N184 Chronic kidney disease, stage 4 (severe): Secondary | ICD-10-CM | POA: Diagnosis not present

## 2016-03-14 DIAGNOSIS — N179 Acute kidney failure, unspecified: Secondary | ICD-10-CM | POA: Insufficient documentation

## 2016-03-14 DIAGNOSIS — E559 Vitamin D deficiency, unspecified: Secondary | ICD-10-CM | POA: Diagnosis not present

## 2016-03-14 DIAGNOSIS — E875 Hyperkalemia: Secondary | ICD-10-CM | POA: Diagnosis not present

## 2016-03-14 DIAGNOSIS — E1122 Type 2 diabetes mellitus with diabetic chronic kidney disease: Secondary | ICD-10-CM | POA: Diagnosis not present

## 2016-03-14 DIAGNOSIS — E889 Metabolic disorder, unspecified: Secondary | ICD-10-CM | POA: Diagnosis not present

## 2016-03-14 DIAGNOSIS — D631 Anemia in chronic kidney disease: Secondary | ICD-10-CM | POA: Diagnosis not present

## 2016-03-16 DIAGNOSIS — D631 Anemia in chronic kidney disease: Secondary | ICD-10-CM | POA: Diagnosis not present

## 2016-03-16 DIAGNOSIS — N184 Chronic kidney disease, stage 4 (severe): Secondary | ICD-10-CM | POA: Diagnosis not present

## 2016-03-28 DIAGNOSIS — D631 Anemia in chronic kidney disease: Secondary | ICD-10-CM | POA: Diagnosis not present

## 2016-03-28 DIAGNOSIS — N184 Chronic kidney disease, stage 4 (severe): Secondary | ICD-10-CM | POA: Diagnosis not present

## 2016-03-31 DIAGNOSIS — M9080 Osteopathy in diseases classified elsewhere, unspecified site: Secondary | ICD-10-CM | POA: Diagnosis not present

## 2016-03-31 DIAGNOSIS — E8770 Fluid overload, unspecified: Secondary | ICD-10-CM | POA: Insufficient documentation

## 2016-03-31 DIAGNOSIS — Z905 Acquired absence of kidney: Secondary | ICD-10-CM | POA: Diagnosis not present

## 2016-03-31 DIAGNOSIS — E1122 Type 2 diabetes mellitus with diabetic chronic kidney disease: Secondary | ICD-10-CM | POA: Diagnosis not present

## 2016-03-31 DIAGNOSIS — E8779 Other fluid overload: Secondary | ICD-10-CM | POA: Diagnosis not present

## 2016-03-31 DIAGNOSIS — E559 Vitamin D deficiency, unspecified: Secondary | ICD-10-CM | POA: Diagnosis not present

## 2016-03-31 DIAGNOSIS — N185 Chronic kidney disease, stage 5: Secondary | ICD-10-CM | POA: Diagnosis not present

## 2016-03-31 DIAGNOSIS — D631 Anemia in chronic kidney disease: Secondary | ICD-10-CM | POA: Diagnosis not present

## 2016-03-31 DIAGNOSIS — I120 Hypertensive chronic kidney disease with stage 5 chronic kidney disease or end stage renal disease: Secondary | ICD-10-CM | POA: Diagnosis not present

## 2016-03-31 DIAGNOSIS — E889 Metabolic disorder, unspecified: Secondary | ICD-10-CM | POA: Diagnosis not present

## 2016-04-17 DIAGNOSIS — N185 Chronic kidney disease, stage 5: Secondary | ICD-10-CM | POA: Diagnosis not present

## 2016-04-17 DIAGNOSIS — D631 Anemia in chronic kidney disease: Secondary | ICD-10-CM | POA: Diagnosis not present

## 2016-04-18 DIAGNOSIS — N185 Chronic kidney disease, stage 5: Secondary | ICD-10-CM | POA: Diagnosis not present

## 2016-04-18 DIAGNOSIS — D631 Anemia in chronic kidney disease: Secondary | ICD-10-CM | POA: Diagnosis not present

## 2016-04-27 DIAGNOSIS — R0989 Other specified symptoms and signs involving the circulatory and respiratory systems: Secondary | ICD-10-CM | POA: Diagnosis not present

## 2016-04-27 DIAGNOSIS — Z8673 Personal history of transient ischemic attack (TIA), and cerebral infarction without residual deficits: Secondary | ICD-10-CM | POA: Diagnosis not present

## 2016-04-27 DIAGNOSIS — E8779 Other fluid overload: Secondary | ICD-10-CM | POA: Diagnosis not present

## 2016-04-27 DIAGNOSIS — N185 Chronic kidney disease, stage 5: Secondary | ICD-10-CM | POA: Diagnosis not present

## 2016-04-27 DIAGNOSIS — I120 Hypertensive chronic kidney disease with stage 5 chronic kidney disease or end stage renal disease: Secondary | ICD-10-CM | POA: Diagnosis not present

## 2016-05-02 DIAGNOSIS — I120 Hypertensive chronic kidney disease with stage 5 chronic kidney disease or end stage renal disease: Secondary | ICD-10-CM | POA: Diagnosis not present

## 2016-05-02 DIAGNOSIS — I6523 Occlusion and stenosis of bilateral carotid arteries: Secondary | ICD-10-CM | POA: Diagnosis not present

## 2016-05-02 DIAGNOSIS — N185 Chronic kidney disease, stage 5: Secondary | ICD-10-CM | POA: Diagnosis not present

## 2016-05-02 DIAGNOSIS — R0989 Other specified symptoms and signs involving the circulatory and respiratory systems: Secondary | ICD-10-CM | POA: Diagnosis not present

## 2016-05-05 DIAGNOSIS — N186 End stage renal disease: Secondary | ICD-10-CM | POA: Diagnosis not present

## 2016-05-09 DIAGNOSIS — N186 End stage renal disease: Secondary | ICD-10-CM | POA: Diagnosis not present

## 2016-05-09 DIAGNOSIS — I509 Heart failure, unspecified: Secondary | ICD-10-CM | POA: Diagnosis not present

## 2016-05-09 DIAGNOSIS — Z7982 Long term (current) use of aspirin: Secondary | ICD-10-CM | POA: Diagnosis not present

## 2016-05-09 DIAGNOSIS — Z794 Long term (current) use of insulin: Secondary | ICD-10-CM | POA: Diagnosis not present

## 2016-05-09 DIAGNOSIS — I132 Hypertensive heart and chronic kidney disease with heart failure and with stage 5 chronic kidney disease, or end stage renal disease: Secondary | ICD-10-CM | POA: Diagnosis not present

## 2016-05-09 DIAGNOSIS — E1140 Type 2 diabetes mellitus with diabetic neuropathy, unspecified: Secondary | ICD-10-CM | POA: Diagnosis not present

## 2016-05-09 DIAGNOSIS — Z79899 Other long term (current) drug therapy: Secondary | ICD-10-CM | POA: Diagnosis not present

## 2016-05-09 DIAGNOSIS — Z905 Acquired absence of kidney: Secondary | ICD-10-CM | POA: Diagnosis not present

## 2016-05-09 DIAGNOSIS — E1122 Type 2 diabetes mellitus with diabetic chronic kidney disease: Secondary | ICD-10-CM | POA: Diagnosis not present

## 2016-05-24 DIAGNOSIS — N185 Chronic kidney disease, stage 5: Secondary | ICD-10-CM | POA: Diagnosis not present

## 2016-05-25 DIAGNOSIS — N185 Chronic kidney disease, stage 5: Secondary | ICD-10-CM | POA: Diagnosis not present

## 2016-05-25 DIAGNOSIS — Z905 Acquired absence of kidney: Secondary | ICD-10-CM | POA: Diagnosis not present

## 2016-05-25 DIAGNOSIS — E1122 Type 2 diabetes mellitus with diabetic chronic kidney disease: Secondary | ICD-10-CM | POA: Diagnosis not present

## 2016-05-25 DIAGNOSIS — M9080 Osteopathy in diseases classified elsewhere, unspecified site: Secondary | ICD-10-CM | POA: Diagnosis not present

## 2016-05-25 DIAGNOSIS — D631 Anemia in chronic kidney disease: Secondary | ICD-10-CM | POA: Diagnosis not present

## 2016-05-25 DIAGNOSIS — E559 Vitamin D deficiency, unspecified: Secondary | ICD-10-CM | POA: Diagnosis not present

## 2016-05-25 DIAGNOSIS — I120 Hypertensive chronic kidney disease with stage 5 chronic kidney disease or end stage renal disease: Secondary | ICD-10-CM | POA: Diagnosis not present

## 2016-05-25 DIAGNOSIS — E889 Metabolic disorder, unspecified: Secondary | ICD-10-CM | POA: Diagnosis not present

## 2016-05-29 DIAGNOSIS — D508 Other iron deficiency anemias: Secondary | ICD-10-CM | POA: Diagnosis not present

## 2016-06-13 DIAGNOSIS — N185 Chronic kidney disease, stage 5: Secondary | ICD-10-CM | POA: Diagnosis not present

## 2016-06-16 DIAGNOSIS — D631 Anemia in chronic kidney disease: Secondary | ICD-10-CM | POA: Diagnosis not present

## 2016-06-16 DIAGNOSIS — N185 Chronic kidney disease, stage 5: Secondary | ICD-10-CM | POA: Diagnosis not present

## 2016-06-21 DIAGNOSIS — N185 Chronic kidney disease, stage 5: Secondary | ICD-10-CM | POA: Diagnosis not present

## 2016-06-23 DIAGNOSIS — E8779 Other fluid overload: Secondary | ICD-10-CM | POA: Diagnosis not present

## 2016-06-23 DIAGNOSIS — D631 Anemia in chronic kidney disease: Secondary | ICD-10-CM | POA: Diagnosis not present

## 2016-06-23 DIAGNOSIS — E875 Hyperkalemia: Secondary | ICD-10-CM | POA: Diagnosis not present

## 2016-06-23 DIAGNOSIS — Z905 Acquired absence of kidney: Secondary | ICD-10-CM | POA: Diagnosis not present

## 2016-06-23 DIAGNOSIS — I120 Hypertensive chronic kidney disease with stage 5 chronic kidney disease or end stage renal disease: Secondary | ICD-10-CM | POA: Diagnosis not present

## 2016-06-23 DIAGNOSIS — E889 Metabolic disorder, unspecified: Secondary | ICD-10-CM | POA: Diagnosis not present

## 2016-06-23 DIAGNOSIS — N185 Chronic kidney disease, stage 5: Secondary | ICD-10-CM | POA: Diagnosis not present

## 2016-06-23 DIAGNOSIS — E1122 Type 2 diabetes mellitus with diabetic chronic kidney disease: Secondary | ICD-10-CM | POA: Diagnosis not present

## 2016-06-23 DIAGNOSIS — M9080 Osteopathy in diseases classified elsewhere, unspecified site: Secondary | ICD-10-CM | POA: Diagnosis not present

## 2016-06-27 DIAGNOSIS — Z114 Encounter for screening for human immunodeficiency virus [HIV]: Secondary | ICD-10-CM | POA: Diagnosis not present

## 2016-06-29 DIAGNOSIS — D631 Anemia in chronic kidney disease: Secondary | ICD-10-CM | POA: Diagnosis not present

## 2016-06-29 DIAGNOSIS — D509 Iron deficiency anemia, unspecified: Secondary | ICD-10-CM | POA: Diagnosis not present

## 2016-06-29 DIAGNOSIS — E119 Type 2 diabetes mellitus without complications: Secondary | ICD-10-CM | POA: Diagnosis not present

## 2016-06-29 DIAGNOSIS — N186 End stage renal disease: Secondary | ICD-10-CM | POA: Diagnosis not present

## 2016-06-29 DIAGNOSIS — N2581 Secondary hyperparathyroidism of renal origin: Secondary | ICD-10-CM | POA: Diagnosis not present

## 2016-06-29 DIAGNOSIS — Z23 Encounter for immunization: Secondary | ICD-10-CM | POA: Diagnosis not present

## 2016-07-01 DIAGNOSIS — D509 Iron deficiency anemia, unspecified: Secondary | ICD-10-CM | POA: Diagnosis not present

## 2016-07-01 DIAGNOSIS — D631 Anemia in chronic kidney disease: Secondary | ICD-10-CM | POA: Diagnosis not present

## 2016-07-01 DIAGNOSIS — Z23 Encounter for immunization: Secondary | ICD-10-CM | POA: Diagnosis not present

## 2016-07-01 DIAGNOSIS — N2581 Secondary hyperparathyroidism of renal origin: Secondary | ICD-10-CM | POA: Diagnosis not present

## 2016-07-01 DIAGNOSIS — N186 End stage renal disease: Secondary | ICD-10-CM | POA: Diagnosis not present

## 2016-07-04 DIAGNOSIS — N186 End stage renal disease: Secondary | ICD-10-CM | POA: Diagnosis not present

## 2016-07-04 DIAGNOSIS — D509 Iron deficiency anemia, unspecified: Secondary | ICD-10-CM | POA: Diagnosis not present

## 2016-07-04 DIAGNOSIS — D631 Anemia in chronic kidney disease: Secondary | ICD-10-CM | POA: Diagnosis not present

## 2016-07-04 DIAGNOSIS — Z23 Encounter for immunization: Secondary | ICD-10-CM | POA: Diagnosis not present

## 2016-07-04 DIAGNOSIS — N2581 Secondary hyperparathyroidism of renal origin: Secondary | ICD-10-CM | POA: Diagnosis not present

## 2016-07-06 DIAGNOSIS — D509 Iron deficiency anemia, unspecified: Secondary | ICD-10-CM | POA: Diagnosis not present

## 2016-07-06 DIAGNOSIS — N186 End stage renal disease: Secondary | ICD-10-CM | POA: Diagnosis not present

## 2016-07-06 DIAGNOSIS — D631 Anemia in chronic kidney disease: Secondary | ICD-10-CM | POA: Diagnosis not present

## 2016-07-08 DIAGNOSIS — N186 End stage renal disease: Secondary | ICD-10-CM | POA: Diagnosis not present

## 2016-07-08 DIAGNOSIS — D509 Iron deficiency anemia, unspecified: Secondary | ICD-10-CM | POA: Diagnosis not present

## 2016-07-08 DIAGNOSIS — D631 Anemia in chronic kidney disease: Secondary | ICD-10-CM | POA: Diagnosis not present

## 2016-07-10 DIAGNOSIS — D631 Anemia in chronic kidney disease: Secondary | ICD-10-CM | POA: Diagnosis not present

## 2016-07-10 DIAGNOSIS — D509 Iron deficiency anemia, unspecified: Secondary | ICD-10-CM | POA: Diagnosis not present

## 2016-07-10 DIAGNOSIS — N186 End stage renal disease: Secondary | ICD-10-CM | POA: Diagnosis not present

## 2016-07-13 DIAGNOSIS — N186 End stage renal disease: Secondary | ICD-10-CM | POA: Diagnosis not present

## 2016-07-13 DIAGNOSIS — D631 Anemia in chronic kidney disease: Secondary | ICD-10-CM | POA: Diagnosis not present

## 2016-07-13 DIAGNOSIS — D509 Iron deficiency anemia, unspecified: Secondary | ICD-10-CM | POA: Diagnosis not present

## 2016-07-15 DIAGNOSIS — D509 Iron deficiency anemia, unspecified: Secondary | ICD-10-CM | POA: Diagnosis not present

## 2016-07-15 DIAGNOSIS — N186 End stage renal disease: Secondary | ICD-10-CM | POA: Diagnosis not present

## 2016-07-15 DIAGNOSIS — D631 Anemia in chronic kidney disease: Secondary | ICD-10-CM | POA: Diagnosis not present

## 2016-07-18 DIAGNOSIS — D509 Iron deficiency anemia, unspecified: Secondary | ICD-10-CM | POA: Diagnosis not present

## 2016-07-18 DIAGNOSIS — N186 End stage renal disease: Secondary | ICD-10-CM | POA: Diagnosis not present

## 2016-07-20 DIAGNOSIS — D509 Iron deficiency anemia, unspecified: Secondary | ICD-10-CM | POA: Diagnosis not present

## 2016-07-20 DIAGNOSIS — N186 End stage renal disease: Secondary | ICD-10-CM | POA: Diagnosis not present

## 2016-07-22 DIAGNOSIS — D509 Iron deficiency anemia, unspecified: Secondary | ICD-10-CM | POA: Diagnosis not present

## 2016-07-22 DIAGNOSIS — N186 End stage renal disease: Secondary | ICD-10-CM | POA: Diagnosis not present

## 2016-07-24 DIAGNOSIS — I509 Heart failure, unspecified: Secondary | ICD-10-CM | POA: Diagnosis not present

## 2016-07-24 DIAGNOSIS — I517 Cardiomegaly: Secondary | ICD-10-CM | POA: Diagnosis not present

## 2016-07-24 DIAGNOSIS — J90 Pleural effusion, not elsewhere classified: Secondary | ICD-10-CM | POA: Diagnosis not present

## 2016-07-25 DIAGNOSIS — D509 Iron deficiency anemia, unspecified: Secondary | ICD-10-CM | POA: Diagnosis not present

## 2016-07-25 DIAGNOSIS — N186 End stage renal disease: Secondary | ICD-10-CM | POA: Diagnosis not present

## 2016-07-26 DIAGNOSIS — Z992 Dependence on renal dialysis: Secondary | ICD-10-CM | POA: Diagnosis not present

## 2016-07-26 DIAGNOSIS — N186 End stage renal disease: Secondary | ICD-10-CM | POA: Diagnosis not present

## 2016-07-27 DIAGNOSIS — Z23 Encounter for immunization: Secondary | ICD-10-CM | POA: Diagnosis not present

## 2016-07-27 DIAGNOSIS — N2581 Secondary hyperparathyroidism of renal origin: Secondary | ICD-10-CM | POA: Diagnosis not present

## 2016-07-27 DIAGNOSIS — N186 End stage renal disease: Secondary | ICD-10-CM | POA: Diagnosis not present

## 2016-07-27 DIAGNOSIS — D631 Anemia in chronic kidney disease: Secondary | ICD-10-CM | POA: Diagnosis not present

## 2016-07-27 DIAGNOSIS — D509 Iron deficiency anemia, unspecified: Secondary | ICD-10-CM | POA: Diagnosis not present

## 2016-07-29 DIAGNOSIS — N2581 Secondary hyperparathyroidism of renal origin: Secondary | ICD-10-CM | POA: Diagnosis not present

## 2016-07-29 DIAGNOSIS — D631 Anemia in chronic kidney disease: Secondary | ICD-10-CM | POA: Diagnosis not present

## 2016-07-29 DIAGNOSIS — N186 End stage renal disease: Secondary | ICD-10-CM | POA: Diagnosis not present

## 2016-07-29 DIAGNOSIS — Z23 Encounter for immunization: Secondary | ICD-10-CM | POA: Diagnosis not present

## 2016-07-29 DIAGNOSIS — D509 Iron deficiency anemia, unspecified: Secondary | ICD-10-CM | POA: Diagnosis not present

## 2016-08-01 DIAGNOSIS — D509 Iron deficiency anemia, unspecified: Secondary | ICD-10-CM | POA: Diagnosis not present

## 2016-08-01 DIAGNOSIS — N2581 Secondary hyperparathyroidism of renal origin: Secondary | ICD-10-CM | POA: Diagnosis not present

## 2016-08-01 DIAGNOSIS — D631 Anemia in chronic kidney disease: Secondary | ICD-10-CM | POA: Diagnosis not present

## 2016-08-01 DIAGNOSIS — Z23 Encounter for immunization: Secondary | ICD-10-CM | POA: Diagnosis not present

## 2016-08-01 DIAGNOSIS — N186 End stage renal disease: Secondary | ICD-10-CM | POA: Diagnosis not present

## 2016-08-03 DIAGNOSIS — Z23 Encounter for immunization: Secondary | ICD-10-CM | POA: Diagnosis not present

## 2016-08-03 DIAGNOSIS — E1165 Type 2 diabetes mellitus with hyperglycemia: Secondary | ICD-10-CM | POA: Diagnosis not present

## 2016-08-03 DIAGNOSIS — D509 Iron deficiency anemia, unspecified: Secondary | ICD-10-CM | POA: Diagnosis not present

## 2016-08-03 DIAGNOSIS — N186 End stage renal disease: Secondary | ICD-10-CM | POA: Diagnosis not present

## 2016-08-03 DIAGNOSIS — D631 Anemia in chronic kidney disease: Secondary | ICD-10-CM | POA: Diagnosis not present

## 2016-08-03 DIAGNOSIS — N2581 Secondary hyperparathyroidism of renal origin: Secondary | ICD-10-CM | POA: Diagnosis not present

## 2016-08-05 DIAGNOSIS — Z23 Encounter for immunization: Secondary | ICD-10-CM | POA: Diagnosis not present

## 2016-08-05 DIAGNOSIS — D631 Anemia in chronic kidney disease: Secondary | ICD-10-CM | POA: Diagnosis not present

## 2016-08-05 DIAGNOSIS — N186 End stage renal disease: Secondary | ICD-10-CM | POA: Diagnosis not present

## 2016-08-05 DIAGNOSIS — N2581 Secondary hyperparathyroidism of renal origin: Secondary | ICD-10-CM | POA: Diagnosis not present

## 2016-08-05 DIAGNOSIS — D509 Iron deficiency anemia, unspecified: Secondary | ICD-10-CM | POA: Diagnosis not present

## 2016-08-08 DIAGNOSIS — D631 Anemia in chronic kidney disease: Secondary | ICD-10-CM | POA: Diagnosis not present

## 2016-08-08 DIAGNOSIS — N186 End stage renal disease: Secondary | ICD-10-CM | POA: Diagnosis not present

## 2016-08-08 DIAGNOSIS — E8779 Other fluid overload: Secondary | ICD-10-CM | POA: Diagnosis not present

## 2016-08-09 DIAGNOSIS — E8779 Other fluid overload: Secondary | ICD-10-CM | POA: Diagnosis not present

## 2016-08-09 DIAGNOSIS — D631 Anemia in chronic kidney disease: Secondary | ICD-10-CM | POA: Diagnosis not present

## 2016-08-09 DIAGNOSIS — N186 End stage renal disease: Secondary | ICD-10-CM | POA: Diagnosis not present

## 2016-08-10 DIAGNOSIS — N186 End stage renal disease: Secondary | ICD-10-CM | POA: Diagnosis not present

## 2016-08-10 DIAGNOSIS — D631 Anemia in chronic kidney disease: Secondary | ICD-10-CM | POA: Diagnosis not present

## 2016-08-10 DIAGNOSIS — E8779 Other fluid overload: Secondary | ICD-10-CM | POA: Diagnosis not present

## 2016-08-12 DIAGNOSIS — D631 Anemia in chronic kidney disease: Secondary | ICD-10-CM | POA: Diagnosis not present

## 2016-08-12 DIAGNOSIS — E8779 Other fluid overload: Secondary | ICD-10-CM | POA: Diagnosis not present

## 2016-08-12 DIAGNOSIS — N186 End stage renal disease: Secondary | ICD-10-CM | POA: Diagnosis not present

## 2016-08-15 DIAGNOSIS — E8779 Other fluid overload: Secondary | ICD-10-CM | POA: Diagnosis not present

## 2016-08-15 DIAGNOSIS — D631 Anemia in chronic kidney disease: Secondary | ICD-10-CM | POA: Diagnosis not present

## 2016-08-15 DIAGNOSIS — N186 End stage renal disease: Secondary | ICD-10-CM | POA: Diagnosis not present

## 2016-08-17 DIAGNOSIS — N186 End stage renal disease: Secondary | ICD-10-CM | POA: Diagnosis not present

## 2016-08-19 DIAGNOSIS — N186 End stage renal disease: Secondary | ICD-10-CM | POA: Diagnosis not present

## 2016-08-22 DIAGNOSIS — N186 End stage renal disease: Secondary | ICD-10-CM | POA: Diagnosis not present

## 2016-08-23 DIAGNOSIS — Z992 Dependence on renal dialysis: Secondary | ICD-10-CM | POA: Diagnosis not present

## 2016-08-23 DIAGNOSIS — N186 End stage renal disease: Secondary | ICD-10-CM | POA: Diagnosis not present

## 2016-08-24 DIAGNOSIS — N390 Urinary tract infection, site not specified: Secondary | ICD-10-CM | POA: Diagnosis not present

## 2016-08-24 DIAGNOSIS — D631 Anemia in chronic kidney disease: Secondary | ICD-10-CM | POA: Diagnosis not present

## 2016-08-24 DIAGNOSIS — N186 End stage renal disease: Secondary | ICD-10-CM | POA: Diagnosis not present

## 2016-08-24 DIAGNOSIS — J90 Pleural effusion, not elsewhere classified: Secondary | ICD-10-CM | POA: Diagnosis not present

## 2016-08-24 DIAGNOSIS — N200 Calculus of kidney: Secondary | ICD-10-CM | POA: Diagnosis not present

## 2016-08-24 DIAGNOSIS — R109 Unspecified abdominal pain: Secondary | ICD-10-CM | POA: Diagnosis not present

## 2016-08-26 DIAGNOSIS — N186 End stage renal disease: Secondary | ICD-10-CM | POA: Diagnosis not present

## 2016-08-26 DIAGNOSIS — D631 Anemia in chronic kidney disease: Secondary | ICD-10-CM | POA: Diagnosis not present

## 2016-08-29 DIAGNOSIS — N186 End stage renal disease: Secondary | ICD-10-CM | POA: Diagnosis not present

## 2016-08-29 DIAGNOSIS — D631 Anemia in chronic kidney disease: Secondary | ICD-10-CM | POA: Diagnosis not present

## 2016-08-31 DIAGNOSIS — E1165 Type 2 diabetes mellitus with hyperglycemia: Secondary | ICD-10-CM | POA: Diagnosis not present

## 2016-08-31 DIAGNOSIS — D631 Anemia in chronic kidney disease: Secondary | ICD-10-CM | POA: Diagnosis not present

## 2016-08-31 DIAGNOSIS — N186 End stage renal disease: Secondary | ICD-10-CM | POA: Diagnosis not present

## 2016-09-02 DIAGNOSIS — N186 End stage renal disease: Secondary | ICD-10-CM | POA: Diagnosis not present

## 2016-09-02 DIAGNOSIS — D631 Anemia in chronic kidney disease: Secondary | ICD-10-CM | POA: Diagnosis not present

## 2016-09-05 DIAGNOSIS — N186 End stage renal disease: Secondary | ICD-10-CM | POA: Diagnosis not present

## 2016-09-05 DIAGNOSIS — D631 Anemia in chronic kidney disease: Secondary | ICD-10-CM | POA: Diagnosis not present

## 2016-09-05 DIAGNOSIS — D509 Iron deficiency anemia, unspecified: Secondary | ICD-10-CM | POA: Diagnosis not present

## 2016-09-07 DIAGNOSIS — D509 Iron deficiency anemia, unspecified: Secondary | ICD-10-CM | POA: Diagnosis not present

## 2016-09-07 DIAGNOSIS — N186 End stage renal disease: Secondary | ICD-10-CM | POA: Diagnosis not present

## 2016-09-07 DIAGNOSIS — D631 Anemia in chronic kidney disease: Secondary | ICD-10-CM | POA: Diagnosis not present

## 2016-09-09 DIAGNOSIS — D509 Iron deficiency anemia, unspecified: Secondary | ICD-10-CM | POA: Diagnosis not present

## 2016-09-09 DIAGNOSIS — N186 End stage renal disease: Secondary | ICD-10-CM | POA: Diagnosis not present

## 2016-09-09 DIAGNOSIS — D631 Anemia in chronic kidney disease: Secondary | ICD-10-CM | POA: Diagnosis not present

## 2016-09-12 DIAGNOSIS — D631 Anemia in chronic kidney disease: Secondary | ICD-10-CM | POA: Diagnosis not present

## 2016-09-12 DIAGNOSIS — N186 End stage renal disease: Secondary | ICD-10-CM | POA: Diagnosis not present

## 2016-09-12 DIAGNOSIS — D509 Iron deficiency anemia, unspecified: Secondary | ICD-10-CM | POA: Diagnosis not present

## 2016-09-14 DIAGNOSIS — D509 Iron deficiency anemia, unspecified: Secondary | ICD-10-CM | POA: Diagnosis not present

## 2016-09-14 DIAGNOSIS — N186 End stage renal disease: Secondary | ICD-10-CM | POA: Diagnosis not present

## 2016-09-14 DIAGNOSIS — D631 Anemia in chronic kidney disease: Secondary | ICD-10-CM | POA: Diagnosis not present

## 2016-09-16 DIAGNOSIS — D631 Anemia in chronic kidney disease: Secondary | ICD-10-CM | POA: Diagnosis not present

## 2016-09-16 DIAGNOSIS — D509 Iron deficiency anemia, unspecified: Secondary | ICD-10-CM | POA: Diagnosis not present

## 2016-09-16 DIAGNOSIS — N186 End stage renal disease: Secondary | ICD-10-CM | POA: Diagnosis not present

## 2016-09-19 DIAGNOSIS — D509 Iron deficiency anemia, unspecified: Secondary | ICD-10-CM | POA: Diagnosis not present

## 2016-09-19 DIAGNOSIS — D631 Anemia in chronic kidney disease: Secondary | ICD-10-CM | POA: Diagnosis not present

## 2016-09-19 DIAGNOSIS — N186 End stage renal disease: Secondary | ICD-10-CM | POA: Diagnosis not present

## 2016-09-21 DIAGNOSIS — D509 Iron deficiency anemia, unspecified: Secondary | ICD-10-CM | POA: Diagnosis not present

## 2016-09-21 DIAGNOSIS — D631 Anemia in chronic kidney disease: Secondary | ICD-10-CM | POA: Diagnosis not present

## 2016-09-21 DIAGNOSIS — N186 End stage renal disease: Secondary | ICD-10-CM | POA: Diagnosis not present

## 2016-09-23 DIAGNOSIS — Z992 Dependence on renal dialysis: Secondary | ICD-10-CM | POA: Diagnosis not present

## 2016-09-23 DIAGNOSIS — D509 Iron deficiency anemia, unspecified: Secondary | ICD-10-CM | POA: Diagnosis not present

## 2016-09-23 DIAGNOSIS — D631 Anemia in chronic kidney disease: Secondary | ICD-10-CM | POA: Diagnosis not present

## 2016-09-23 DIAGNOSIS — N186 End stage renal disease: Secondary | ICD-10-CM | POA: Diagnosis not present

## 2016-09-26 DIAGNOSIS — D631 Anemia in chronic kidney disease: Secondary | ICD-10-CM | POA: Diagnosis not present

## 2016-09-26 DIAGNOSIS — N2581 Secondary hyperparathyroidism of renal origin: Secondary | ICD-10-CM | POA: Diagnosis not present

## 2016-09-26 DIAGNOSIS — D509 Iron deficiency anemia, unspecified: Secondary | ICD-10-CM | POA: Diagnosis not present

## 2016-09-26 DIAGNOSIS — N186 End stage renal disease: Secondary | ICD-10-CM | POA: Diagnosis not present

## 2016-09-28 DIAGNOSIS — T82590D Other mechanical complication of surgically created arteriovenous fistula, subsequent encounter: Secondary | ICD-10-CM | POA: Diagnosis not present

## 2016-09-28 DIAGNOSIS — N186 End stage renal disease: Secondary | ICD-10-CM | POA: Diagnosis not present

## 2016-09-29 DIAGNOSIS — N2581 Secondary hyperparathyroidism of renal origin: Secondary | ICD-10-CM | POA: Diagnosis not present

## 2016-09-29 DIAGNOSIS — D509 Iron deficiency anemia, unspecified: Secondary | ICD-10-CM | POA: Diagnosis not present

## 2016-09-29 DIAGNOSIS — N186 End stage renal disease: Secondary | ICD-10-CM | POA: Diagnosis not present

## 2016-09-29 DIAGNOSIS — D631 Anemia in chronic kidney disease: Secondary | ICD-10-CM | POA: Diagnosis not present

## 2016-09-30 DIAGNOSIS — N2581 Secondary hyperparathyroidism of renal origin: Secondary | ICD-10-CM | POA: Diagnosis not present

## 2016-09-30 DIAGNOSIS — D509 Iron deficiency anemia, unspecified: Secondary | ICD-10-CM | POA: Diagnosis not present

## 2016-09-30 DIAGNOSIS — D631 Anemia in chronic kidney disease: Secondary | ICD-10-CM | POA: Diagnosis not present

## 2016-09-30 DIAGNOSIS — N186 End stage renal disease: Secondary | ICD-10-CM | POA: Diagnosis not present

## 2016-10-03 DIAGNOSIS — E1165 Type 2 diabetes mellitus with hyperglycemia: Secondary | ICD-10-CM | POA: Diagnosis not present

## 2016-10-03 DIAGNOSIS — D631 Anemia in chronic kidney disease: Secondary | ICD-10-CM | POA: Diagnosis not present

## 2016-10-03 DIAGNOSIS — D509 Iron deficiency anemia, unspecified: Secondary | ICD-10-CM | POA: Diagnosis not present

## 2016-10-03 DIAGNOSIS — N186 End stage renal disease: Secondary | ICD-10-CM | POA: Diagnosis not present

## 2016-10-03 DIAGNOSIS — N2581 Secondary hyperparathyroidism of renal origin: Secondary | ICD-10-CM | POA: Diagnosis not present

## 2016-10-05 DIAGNOSIS — D631 Anemia in chronic kidney disease: Secondary | ICD-10-CM | POA: Diagnosis not present

## 2016-10-05 DIAGNOSIS — N186 End stage renal disease: Secondary | ICD-10-CM | POA: Diagnosis not present

## 2016-10-06 ENCOUNTER — Other Ambulatory Visit: Payer: Self-pay | Admitting: Internal Medicine

## 2016-10-06 DIAGNOSIS — Z1231 Encounter for screening mammogram for malignant neoplasm of breast: Secondary | ICD-10-CM

## 2016-10-07 DIAGNOSIS — D631 Anemia in chronic kidney disease: Secondary | ICD-10-CM | POA: Diagnosis not present

## 2016-10-07 DIAGNOSIS — N186 End stage renal disease: Secondary | ICD-10-CM | POA: Diagnosis not present

## 2016-10-09 ENCOUNTER — Encounter (HOSPITAL_BASED_OUTPATIENT_CLINIC_OR_DEPARTMENT_OTHER): Payer: Self-pay

## 2016-10-09 ENCOUNTER — Ambulatory Visit (HOSPITAL_BASED_OUTPATIENT_CLINIC_OR_DEPARTMENT_OTHER)
Admission: RE | Admit: 2016-10-09 | Discharge: 2016-10-09 | Disposition: A | Discharge status: Home / Self Care | Payer: Medicare HMO | Source: Ambulatory Visit | Attending: Internal Medicine | Admitting: Internal Medicine

## 2016-10-09 DIAGNOSIS — Z1231 Encounter for screening mammogram for malignant neoplasm of breast: Secondary | ICD-10-CM | POA: Diagnosis not present

## 2016-10-10 DIAGNOSIS — N186 End stage renal disease: Secondary | ICD-10-CM | POA: Diagnosis not present

## 2016-10-10 DIAGNOSIS — D631 Anemia in chronic kidney disease: Secondary | ICD-10-CM | POA: Diagnosis not present

## 2016-10-12 DIAGNOSIS — N186 End stage renal disease: Secondary | ICD-10-CM | POA: Diagnosis not present

## 2016-10-12 DIAGNOSIS — D631 Anemia in chronic kidney disease: Secondary | ICD-10-CM | POA: Diagnosis not present

## 2016-10-14 DIAGNOSIS — D631 Anemia in chronic kidney disease: Secondary | ICD-10-CM | POA: Diagnosis not present

## 2016-10-14 DIAGNOSIS — N186 End stage renal disease: Secondary | ICD-10-CM | POA: Diagnosis not present

## 2016-10-17 DIAGNOSIS — N186 End stage renal disease: Secondary | ICD-10-CM | POA: Diagnosis not present

## 2016-10-17 DIAGNOSIS — D631 Anemia in chronic kidney disease: Secondary | ICD-10-CM | POA: Diagnosis not present

## 2016-10-19 DIAGNOSIS — D631 Anemia in chronic kidney disease: Secondary | ICD-10-CM | POA: Diagnosis not present

## 2016-10-19 DIAGNOSIS — N186 End stage renal disease: Secondary | ICD-10-CM | POA: Diagnosis not present

## 2016-10-21 DIAGNOSIS — D631 Anemia in chronic kidney disease: Secondary | ICD-10-CM | POA: Diagnosis not present

## 2016-10-21 DIAGNOSIS — N186 End stage renal disease: Secondary | ICD-10-CM | POA: Diagnosis not present

## 2016-10-23 DIAGNOSIS — Z992 Dependence on renal dialysis: Secondary | ICD-10-CM | POA: Diagnosis not present

## 2016-10-23 DIAGNOSIS — N186 End stage renal disease: Secondary | ICD-10-CM | POA: Diagnosis not present

## 2016-10-24 DIAGNOSIS — N186 End stage renal disease: Secondary | ICD-10-CM | POA: Diagnosis not present

## 2016-10-24 DIAGNOSIS — D631 Anemia in chronic kidney disease: Secondary | ICD-10-CM | POA: Diagnosis not present

## 2016-10-24 DIAGNOSIS — Z23 Encounter for immunization: Secondary | ICD-10-CM | POA: Diagnosis not present

## 2016-10-26 DIAGNOSIS — Z23 Encounter for immunization: Secondary | ICD-10-CM | POA: Diagnosis not present

## 2016-10-26 DIAGNOSIS — D631 Anemia in chronic kidney disease: Secondary | ICD-10-CM | POA: Diagnosis not present

## 2016-10-26 DIAGNOSIS — E1165 Type 2 diabetes mellitus with hyperglycemia: Secondary | ICD-10-CM | POA: Diagnosis not present

## 2016-10-26 DIAGNOSIS — N186 End stage renal disease: Secondary | ICD-10-CM | POA: Diagnosis not present

## 2016-10-27 DIAGNOSIS — D631 Anemia in chronic kidney disease: Secondary | ICD-10-CM | POA: Diagnosis not present

## 2016-10-27 DIAGNOSIS — Z23 Encounter for immunization: Secondary | ICD-10-CM | POA: Diagnosis not present

## 2016-10-27 DIAGNOSIS — N186 End stage renal disease: Secondary | ICD-10-CM | POA: Diagnosis not present

## 2016-10-30 DIAGNOSIS — N186 End stage renal disease: Secondary | ICD-10-CM | POA: Diagnosis not present

## 2016-10-30 DIAGNOSIS — D631 Anemia in chronic kidney disease: Secondary | ICD-10-CM | POA: Diagnosis not present

## 2016-10-30 DIAGNOSIS — Z23 Encounter for immunization: Secondary | ICD-10-CM | POA: Diagnosis not present

## 2016-11-01 DIAGNOSIS — Z1211 Encounter for screening for malignant neoplasm of colon: Secondary | ICD-10-CM | POA: Diagnosis not present

## 2016-11-01 DIAGNOSIS — R197 Diarrhea, unspecified: Secondary | ICD-10-CM | POA: Diagnosis not present

## 2016-11-01 DIAGNOSIS — D649 Anemia, unspecified: Secondary | ICD-10-CM | POA: Insufficient documentation

## 2016-11-02 DIAGNOSIS — N186 End stage renal disease: Secondary | ICD-10-CM | POA: Diagnosis not present

## 2016-11-02 DIAGNOSIS — Z23 Encounter for immunization: Secondary | ICD-10-CM | POA: Diagnosis not present

## 2016-11-02 DIAGNOSIS — D631 Anemia in chronic kidney disease: Secondary | ICD-10-CM | POA: Diagnosis not present

## 2016-11-02 DIAGNOSIS — R197 Diarrhea, unspecified: Secondary | ICD-10-CM | POA: Diagnosis not present

## 2016-11-04 DIAGNOSIS — Z23 Encounter for immunization: Secondary | ICD-10-CM | POA: Diagnosis not present

## 2016-11-04 DIAGNOSIS — N186 End stage renal disease: Secondary | ICD-10-CM | POA: Diagnosis not present

## 2016-11-07 DIAGNOSIS — N186 End stage renal disease: Secondary | ICD-10-CM | POA: Diagnosis not present

## 2016-11-07 DIAGNOSIS — Z23 Encounter for immunization: Secondary | ICD-10-CM | POA: Diagnosis not present

## 2016-11-09 DIAGNOSIS — N186 End stage renal disease: Secondary | ICD-10-CM | POA: Diagnosis not present

## 2016-11-09 DIAGNOSIS — Z23 Encounter for immunization: Secondary | ICD-10-CM | POA: Diagnosis not present

## 2016-11-11 DIAGNOSIS — Z23 Encounter for immunization: Secondary | ICD-10-CM | POA: Diagnosis not present

## 2016-11-11 DIAGNOSIS — N186 End stage renal disease: Secondary | ICD-10-CM | POA: Diagnosis not present

## 2016-11-13 LAB — HM COLONOSCOPY

## 2016-11-14 DIAGNOSIS — D631 Anemia in chronic kidney disease: Secondary | ICD-10-CM | POA: Diagnosis not present

## 2016-11-14 DIAGNOSIS — N186 End stage renal disease: Secondary | ICD-10-CM | POA: Diagnosis not present

## 2016-11-16 DIAGNOSIS — N186 End stage renal disease: Secondary | ICD-10-CM | POA: Diagnosis not present

## 2016-11-16 DIAGNOSIS — D631 Anemia in chronic kidney disease: Secondary | ICD-10-CM | POA: Diagnosis not present

## 2016-11-18 DIAGNOSIS — N186 End stage renal disease: Secondary | ICD-10-CM | POA: Diagnosis not present

## 2016-11-18 DIAGNOSIS — D631 Anemia in chronic kidney disease: Secondary | ICD-10-CM | POA: Diagnosis not present

## 2016-11-21 DIAGNOSIS — N186 End stage renal disease: Secondary | ICD-10-CM | POA: Diagnosis not present

## 2016-11-21 DIAGNOSIS — D631 Anemia in chronic kidney disease: Secondary | ICD-10-CM | POA: Diagnosis not present

## 2016-11-23 DIAGNOSIS — D631 Anemia in chronic kidney disease: Secondary | ICD-10-CM | POA: Diagnosis not present

## 2016-11-23 DIAGNOSIS — N186 End stage renal disease: Secondary | ICD-10-CM | POA: Diagnosis not present

## 2016-11-23 DIAGNOSIS — Z992 Dependence on renal dialysis: Secondary | ICD-10-CM | POA: Diagnosis not present

## 2016-11-24 DIAGNOSIS — Z01419 Encounter for gynecological examination (general) (routine) without abnormal findings: Secondary | ICD-10-CM | POA: Diagnosis not present

## 2016-11-24 NOTE — Progress Notes (Signed)
Pre visit review using our clinic review tool, if applicable. No additional management support is needed unless otherwise documented below in the visit note. 

## 2016-11-24 NOTE — Progress Notes (Addendum)
Subjective:   Mary Valencia is a 61 y.o. female who presents for an Initial Medicare Annual Wellness Visit.  Review of Systems    No ROS.  Medicare Wellness Visit. Cardiac Risk Factors include: advanced age (>30men, >80 women);diabetes mellitus;dyslipidemia;hypertension Sleep patterns: Patient reports insomnia issues, discussed recommended sleep tips and stress reduction tips, education was attached to patient's AVS.  Home Safety/Smoke Alarms: Feels safe in home. Smoke alarms in place.   Living environment; residence and Firearm Safety: 1-story house/ trailer, no firearms, firearms stored safely. Lives with husband, no DME needed, reports good family and church support Seat Belt Safety/Bike Helmet: Wears seat belt.   Counseling:   Eye Exam- patient states she will make an appointment Dental- resources given  Female:   Pap-  Patient states she had pelvic exam 11/24/16, she will request report to be sent to PCP  Mammo- Last 10/09/16, BI-RADS CATEGORY  1: Negative      Dexa scan- N/D      CCS- Patient had appointment scheduled for 6/22, she will request report to be sent to PCP     Objective:    Today's Vitals   11/27/16 1031  BP: (!) 162/60  Pulse: (!) 51  Resp: 20  SpO2: 100%  Weight: 140 lb (63.5 kg)  Height: 5\' 2"  (1.575 m)   Body mass index is 25.61 kg/m.   Current Medications (verified) Outpatient Encounter Prescriptions as of 11/27/2016  Medication Sig  . aspirin 81 MG tablet Take 81 mg by mouth daily.  Marland Kitchen CALCIUM-MAGNESIUM-VITAMIN D PO Take 1 tablet by mouth daily.  . Cholecalciferol (VITAMIN D-3) 5000 UNITS TABS Take 1 tablet by mouth daily.  . diphenhydrAMINE (BENADRYL) 25 mg capsule Take 25 mg by mouth as needed.  . hydrALAZINE (APRESOLINE) 100 MG tablet Take 100 mg by mouth 3 (three) times daily.  . insulin NPH-regular Human (NOVOLIN 70/30) (70-30) 100 UNIT/ML injection Inject 10 Units into the skin 2 (two) times daily.  . isosorbide mononitrate (IMDUR) 30 MG  24 hr tablet Take 90 mg by mouth daily.  Marland Kitchen losartan (COZAAR) 100 MG tablet Take 100 mg by mouth daily.  Marland Kitchen amLODipine (NORVASC) 5 MG tablet Take 5 mg by mouth daily.  . ferrous sulfate 325 (65 FE) MG tablet Take 325 mg by mouth 2 (two) times daily.  . furosemide (LASIX) 40 MG tablet Take 40 mg by mouth daily.  Marland Kitchen labetalol (NORMODYNE) 200 MG tablet Take 600 mg by mouth 3 (three) times daily.   . [DISCONTINUED] escitalopram (LEXAPRO) 5 MG tablet Take 1 tablet (5 mg total) by mouth daily. (Patient not taking: Reported on 11/27/2016)  . [DISCONTINUED] isosorbide mononitrate (IMDUR) 60 MG 24 hr tablet Take 60 mg by mouth daily.   No facility-administered encounter medications on file as of 11/27/2016.     Allergies (verified) Betadine [povidone iodine]; Clonidine derivatives; Gabapentin; Iodine; Penicillins; Sulfonamide derivatives; Tetracyclines & related; and Naproxen   History: Past Medical History:  Diagnosis Date  . Allergic rhinitis   . 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 Edema 10/27/2012   L>R  . GERD (gastroesophageal reflux disease)   . HTN (hypertension)   . Hyperlipidemia   . Type II or unspecified type diabetes mellitus without mention of complication, not stated as uncontrolled    Past Surgical History:  Procedure Laterality Date  . ABDOMINAL HYSTERECTOMY     partial  . KIDNEY SURGERY     right removed  . NEPHRECTOMY  right - related to untreated strep infection and stones  . TONSILLECTOMY     Family History  Problem Relation Age of Onset  . Heart disease Mother 61       MI  . Heart disease Father 72       MI  . Alcohol abuse Sister   . Kidney disease Sister   . Heart disease Brother        ?CHF ?CAD  . Heart disease Maternal Aunt   . Diabetes Other    Social History   Occupational History  . Not on file.   Social History Main Topics  . Smoking status: Never Smoker  . Smokeless tobacco: Never Used  . Alcohol use No  . Drug use: No  .  Sexual activity: Yes    Tobacco Counseling Counseling given: Not Answered   Activities of Daily Living In your present state of health, do you have any difficulty performing the following activities: 11/27/2016  Hearing? N  Vision? N  Difficulty concentrating or making decisions? N  Walking or climbing stairs? N  Dressing or bathing? N  Doing errands, shopping? N  Preparing Food and eating ? N  Using the Toilet? N  In the past six months, have you accidently leaked urine? N  Do you have problems with loss of bowel control? N  Managing your Medications? N  Managing your Finances? N  Housekeeping or managing your Housekeeping? N  Some recent data might be hidden    Immunizations and Health Maintenance Immunization History  Administered Date(s) Administered  . Influenza Whole 05/16/2005  . Td 09/26/2004, 08/25/2011   Health Maintenance Due  Topic Date Due  . Hepatitis C Screening  04-10-1956  . PNEUMOCOCCAL POLYSACCHARIDE VACCINE (1) 06/12/1956  . OPHTHALMOLOGY EXAM  06/12/1964  . HIV Screening  06/12/1969  . PAP SMEAR  06/13/1975  . COLONOSCOPY  06/12/2004  . FOOT EXAM  05/06/2014  . HEMOGLOBIN A1C  06/04/2014    Patient Care Team: Plotnikov, Evie Lacks, MD as PCP - General Ninfa Linden Lind Guest, MD (Orthopedic Surgery) Boyd Kerbs, MD as Referring Physician (Neurology) Mezer, Nadara Mustard, MD (Obstetrics and Gynecology)  Indicate any recent Medical Services you may have received from other than [facility] providers in the past year (date may be approximate).     Assessment:   This is a routine wellness examination for Hattye.Physical assessment deferred to PCP.   Hearing/Vision screen Hearing Screening Comments: Able to hear conversational tones w/o difficulty. No issues reported.   Vision Screening Comments: Wears glasses  Dietary issues and exercise activities discussed: Current Exercise Habits: The patient does not participate in regular exercise at present,  Exercise limited by: None identified  Diet (meal preparation, eat out, water intake, caffeinated beverages, dairy products, fruits and vegetables):  Patient on very strict fluid restriction and renal diet due to dialysis, she reports not needing any education regarding renal diet and states that she is compliant.   Goals    . Become active again in my church          Volunteer in my church I will am starting this week.       Depression Screen PHQ 2/9 Scores 11/27/2016  PHQ - 2 Score 0    Fall Risk Fall Risk  11/27/2016  Falls in the past year? Yes  Number falls in past yr: 1    Cognitive Function:       Ad8 score reviewed for issues:  Issues making decisions: no  Less interest  in hobbies / activities: no  Repeats questions, stories (family complaining): no  Trouble using ordinary gadgets (microwave, computer, phone): no  Forgets the month or year: no  Mismanaging finances: no  Remembering appts: no Daily problems with thinking and/or memory: no Ad8 score is=0  Screening Tests Health Maintenance  Topic Date Due  . Hepatitis C Screening  04-27-1956  . PNEUMOCOCCAL POLYSACCHARIDE VACCINE (1) 06/12/1956  . OPHTHALMOLOGY EXAM  06/12/1964  . HIV Screening  06/12/1969  . PAP SMEAR  06/13/1975  . COLONOSCOPY  06/12/2004  . FOOT EXAM  05/06/2014  . HEMOGLOBIN A1C  06/04/2014  . INFLUENZA VACCINE  01/24/2017  . MAMMOGRAM  10/10/2018  . TETANUS/TDAP  08/24/2021      Plan:    Have facility send results of colonoscopy to Dr. Alain Marion  Please ask dialysis center to send copy of pneumonia vaccine, Hep-c,  HIV screening to Dr. Alain Marion  Continue doing brain stimulating activities (puzzles, reading, adult coloring books, staying active) to keep memory sharp.   I have personally reviewed and noted the following in the patient's chart:   . Medical and social history . Use of alcohol, tobacco or illicit drugs  . Current medications and supplements . Functional  ability and status . Nutritional status . Physical activity . Advanced directives . List of other physicians . Vitals . Screenings to include cognitive, depression, and falls . Referrals and appointments  In addition, I have reviewed and discussed with patient certain preventive protocols, quality metrics, and best practice recommendations. A written personalized care plan for preventive services as well as general preventive health recommendations were provided to patient.     Michiel Cowboy, RN   11/27/2016   Medical screening examination/treatment/procedure(s) were performed by non-physician practitioner and as supervising physician I was immediately available for consultation/collaboration. I agree with above. Walker Kehr, MD

## 2016-11-25 DIAGNOSIS — D631 Anemia in chronic kidney disease: Secondary | ICD-10-CM | POA: Diagnosis not present

## 2016-11-25 DIAGNOSIS — N186 End stage renal disease: Secondary | ICD-10-CM | POA: Diagnosis not present

## 2016-11-27 ENCOUNTER — Ambulatory Visit (INDEPENDENT_AMBULATORY_CARE_PROVIDER_SITE_OTHER): Payer: Medicare HMO | Admitting: *Deleted

## 2016-11-27 VITALS — BP 162/60 | HR 51 | Resp 20 | Ht 62.0 in | Wt 140.0 lb

## 2016-11-27 DIAGNOSIS — Z0000 Encounter for general adult medical examination without abnormal findings: Secondary | ICD-10-CM | POA: Diagnosis not present

## 2016-11-27 NOTE — Patient Instructions (Addendum)
Have facility send results of colonoscopy to Dr. Alain Marion  Please ask dialysis center to send copy of pneumonia vaccine, Hep-c,  HIV screening to Dr. Lavona Mound L. [employer], DDS Dentist in [place], [place] Address: [address], [place], [place] [address] Hours: Open ? Closes 5PM Phone: [phone]  Continue doing brain stimulating activities (puzzles, reading, adult coloring books, staying active) to keep memory sharp.    [name] , Thank you for taking time to come for your Medicare Wellness Visit. I appreciate your ongoing commitment to your health goals. Please review the following plan we discussed and let me know if I can assist you in the future.   These are the goals we discussed: Goals    . Become active again in my church          Volunteer in my church I will am starting this week.        This is a list of the screening recommended for you and due dates:  Health Maintenance  Topic Date Due  .  Hepatitis C: One time screening is recommended by Center for Disease Control  (CDC) for  adults born from [birth year] through 1967.   07-23-1955  . Pneumococcal vaccine (1) 06/12/1956  . Eye exam for diabetics  06/12/1964  . Urine Protein Check  06/12/1964  . HIV Screening  06/12/1969  . Pap Smear  06/13/1975  . Colon Cancer Screening  06/12/2004  . Complete foot exam   05/06/2014  . Hemoglobin A1C  06/04/2014  . Flu Shot  01/24/2017  . Mammogram  10/10/2018  . Tetanus Vaccine  08/24/2021   Insomnia Insomnia is a sleep disorder that makes it difficult to fall asleep or to stay asleep. Insomnia can cause tiredness (fatigue), low energy, difficulty concentrating, mood swings, and poor performance at work or school. There are three different ways to classify insomnia:  Difficulty falling asleep.  Difficulty staying asleep.  Waking up too early in the morning.  Any type of insomnia can be long-term (chronic) or short-term (acute). Both are common.  Short-term insomnia usually lasts for three months or less. Chronic insomnia occurs at least three times a week for longer than three months. What are the causes? Insomnia may be caused by another condition, situation, or substance, such as:  Anxiety.  Certain medicines.  Gastroesophageal reflux disease (GERD) or other gastrointestinal conditions.  Asthma or other breathing conditions.  Restless legs syndrome, sleep apnea, or other sleep disorders.  Chronic pain.  Menopause. This may include hot flashes.  Stroke.  Abuse of alcohol, tobacco, or illegal drugs.  Depression.  Caffeine.  Neurological disorders, such as Alzheimer disease.  An overactive thyroid (hyperthyroidism).  The cause of insomnia may not be known. What increases the risk? Risk factors for insomnia include:  Gender. Women are more commonly affected than men.  Age. Insomnia is more common as you get older.  Stress. This may involve your professional or personal life.  Income. Insomnia is more common in people with lower income.  Lack of exercise.  Irregular work schedule or night shifts.  Traveling between different time zones.  What are the signs or symptoms? If you have insomnia, trouble falling asleep or trouble staying asleep is the main symptom. This may lead to other symptoms, such as:  Feeling fatigued.  Feeling nervous about going to sleep.  Not feeling rested in the morning.  Having trouble concentrating.  Feeling irritable, anxious, or depressed.  How is this  treated? Treatment for insomnia depends on the cause. If your insomnia is caused by an underlying condition, treatment will focus on addressing the condition. Treatment may also include:  Medicines to help you sleep.  Counseling or therapy.  Lifestyle adjustments.  Follow these instructions at home:  Take medicines only as directed by your health care provider.  Keep regular sleeping and waking hours. Avoid  naps.  Keep a sleep diary to help you and your health care provider figure out what could be causing your insomnia. Include: ? When you sleep. ? When you wake up during the night. ? How well you sleep. ? How rested you feel the next day. ? Any side effects of medicines you are taking. ? What you eat and drink.  Make your bedroom a comfortable place where it is easy to fall asleep: ? Put up shades or special blackout curtains to block light from outside. ? Use a white noise machine to block noise. ? Keep the temperature cool.  Exercise regularly as directed by your health care provider. Avoid exercising right before bedtime.  Use relaxation techniques to manage stress. Ask your health care provider to suggest some techniques that may work well for you. These may include: ? Breathing exercises. ? Routines to release muscle tension. ? Visualizing peaceful scenes.  Cut back on alcohol, caffeinated beverages, and cigarettes, especially close to bedtime. These can disrupt your sleep.  Do not overeat or eat spicy foods right before bedtime. This can lead to digestive discomfort that can make it hard for you to sleep.  Limit screen use before bedtime. This includes: ? Watching TV. ? Using your smartphone, tablet, and computer.  Stick to a routine. This can help you fall asleep faster. Try to do a quiet activity, brush your teeth, and go to bed at the same time each night.  Get out of bed if you are still awake after 15 minutes of trying to sleep. Keep the lights down, but try reading or doing a quiet activity. When you feel sleepy, go back to bed.  Make sure that you drive carefully. Avoid driving if you feel very sleepy.  Keep all follow-up appointments as directed by your health care provider. This is important. Contact a health care provider if:  You are tired throughout the day or have trouble in your daily routine due to sleepiness.  You continue to have sleep problems or your  sleep problems get worse. Get help right away if:  You have serious thoughts about hurting yourself or someone else. This information is not intended to replace advice given to you by your health care provider. Make sure you discuss any questions you have with your health care provider. Document Released: 06/09/2000 Document Revised: 11/12/2015 Document Reviewed: 03/13/2014 Elsevier Interactive Patient Education  [employer].

## 2016-11-28 DIAGNOSIS — D631 Anemia in chronic kidney disease: Secondary | ICD-10-CM | POA: Diagnosis not present

## 2016-11-28 DIAGNOSIS — N186 End stage renal disease: Secondary | ICD-10-CM | POA: Diagnosis not present

## 2016-11-29 DIAGNOSIS — Z992 Dependence on renal dialysis: Secondary | ICD-10-CM | POA: Diagnosis not present

## 2016-11-29 DIAGNOSIS — N186 End stage renal disease: Secondary | ICD-10-CM | POA: Diagnosis not present

## 2016-11-30 DIAGNOSIS — D631 Anemia in chronic kidney disease: Secondary | ICD-10-CM | POA: Diagnosis not present

## 2016-11-30 DIAGNOSIS — E1165 Type 2 diabetes mellitus with hyperglycemia: Secondary | ICD-10-CM | POA: Diagnosis not present

## 2016-11-30 DIAGNOSIS — N186 End stage renal disease: Secondary | ICD-10-CM | POA: Diagnosis not present

## 2016-11-30 DIAGNOSIS — E1122 Type 2 diabetes mellitus with diabetic chronic kidney disease: Secondary | ICD-10-CM | POA: Diagnosis not present

## 2016-12-02 DIAGNOSIS — D631 Anemia in chronic kidney disease: Secondary | ICD-10-CM | POA: Diagnosis not present

## 2016-12-02 DIAGNOSIS — N186 End stage renal disease: Secondary | ICD-10-CM | POA: Diagnosis not present

## 2016-12-05 DIAGNOSIS — N186 End stage renal disease: Secondary | ICD-10-CM | POA: Diagnosis not present

## 2016-12-06 DIAGNOSIS — Z992 Dependence on renal dialysis: Secondary | ICD-10-CM | POA: Diagnosis not present

## 2016-12-06 DIAGNOSIS — Z539 Procedure and treatment not carried out, unspecified reason: Secondary | ICD-10-CM | POA: Diagnosis not present

## 2016-12-06 DIAGNOSIS — N186 End stage renal disease: Secondary | ICD-10-CM | POA: Diagnosis not present

## 2016-12-07 DIAGNOSIS — N186 End stage renal disease: Secondary | ICD-10-CM | POA: Diagnosis not present

## 2016-12-08 ENCOUNTER — Ambulatory Visit (INDEPENDENT_AMBULATORY_CARE_PROVIDER_SITE_OTHER): Payer: Medicare HMO | Admitting: Internal Medicine

## 2016-12-08 ENCOUNTER — Encounter: Payer: Self-pay | Admitting: Internal Medicine

## 2016-12-08 VITALS — BP 140/62 | HR 59 | Temp 98.4°F | Ht 62.0 in | Wt 132.0 lb

## 2016-12-08 DIAGNOSIS — Z794 Long term (current) use of insulin: Secondary | ICD-10-CM

## 2016-12-08 DIAGNOSIS — R634 Abnormal weight loss: Secondary | ICD-10-CM | POA: Diagnosis not present

## 2016-12-08 DIAGNOSIS — E785 Hyperlipidemia, unspecified: Secondary | ICD-10-CM | POA: Diagnosis not present

## 2016-12-08 DIAGNOSIS — E118 Type 2 diabetes mellitus with unspecified complications: Secondary | ICD-10-CM | POA: Diagnosis not present

## 2016-12-08 DIAGNOSIS — N185 Chronic kidney disease, stage 5: Secondary | ICD-10-CM | POA: Diagnosis not present

## 2016-12-08 DIAGNOSIS — G459 Transient cerebral ischemic attack, unspecified: Secondary | ICD-10-CM | POA: Diagnosis not present

## 2016-12-08 DIAGNOSIS — R600 Localized edema: Secondary | ICD-10-CM | POA: Diagnosis not present

## 2016-12-08 MED ORDER — ZOSTER VAC RECOMB ADJUVANTED 50 MCG/0.5ML IM SUSR: 0.5000 mL | Freq: Once | INTRAMUSCULAR | 1 refills | Status: AC

## 2016-12-08 NOTE — Progress Notes (Signed)
Subjective:  Patient ID: Mary Valencia, female    DOB: 09-Mar-1956  Age: 61 y.o. MRN: [id number]  CC: No chief complaint on file.   HPI Mary Valencia presents for ESRD on HD since Jan 2018, HTN, DM f/u.   Outpatient Medications Prior to Visit  Medication Sig Dispense Refill  . aspirin 81 MG tablet Take 81 mg by mouth daily.    Marland Kitchen CALCIUM-MAGNESIUM-VITAMIN D PO Take 1 tablet by mouth daily.    . Cholecalciferol (VITAMIN D-3) 5000 UNITS TABS Take 1 tablet by mouth daily.    . diphenhydrAMINE (BENADRYL) 25 mg capsule Take 25 mg by mouth as needed.    . hydrALAZINE (APRESOLINE) 100 MG tablet Take 100 mg by mouth 3 (three) times daily.    . insulin NPH-regular Human (NOVOLIN 70/30) (70-30) 100 UNIT/ML injection Inject 10 Units into the skin 2 (two) times daily.    . isosorbide mononitrate (IMDUR) 30 MG 24 hr tablet Take 90 mg by mouth daily.    Marland Kitchen losartan (COZAAR) 100 MG tablet Take 100 mg by mouth daily.    Marland Kitchen amLODipine (NORVASC) 10 MG tablet Take 10 mg by mouth daily.     Marland Kitchen labetalol (NORMODYNE) 200 MG tablet Take 600 mg by mouth 3 (three) times daily.     . ferrous sulfate 325 (65 FE) MG tablet Take 325 mg by mouth 2 (two) times daily.    . furosemide (LASIX) 40 MG tablet Take 40 mg by mouth daily.     No facility-administered medications prior to visit.     ROS Review of Systems  Constitutional: Positive for fatigue and unexpected weight change. Negative for activity change, appetite change and chills.  HENT: Negative for congestion, mouth sores and sinus pressure.   Eyes: Negative for visual disturbance.  Respiratory: Negative for cough and chest tightness.   Gastrointestinal: Negative for abdominal pain and nausea.  Genitourinary: Negative for difficulty urinating, frequency and vaginal pain.  Musculoskeletal: Negative for back pain and gait problem.  Skin: Negative for pallor and rash.  Neurological: Negative for dizziness, tremors, weakness, numbness and headaches.    Psychiatric/Behavioral: Negative for confusion and sleep disturbance.    Objective:  BP 140/62 (BP Location: Left Arm, Patient Position: Sitting, Cuff Size: Normal)   Pulse (!) 59   Temp 98.4 F (36.9 C) (Oral)   Ht 5\' 2"  (1.575 m)   Wt 132 lb (59.9 kg)   SpO2 96%   BMI 24.14 kg/m   BP Readings from Last 3 Encounters:  12/08/16 140/62  11/27/16 (!) 162/60  10/06/15 (!) 168/85    Wt Readings from Last 3 Encounters:  12/08/16 132 lb (59.9 kg)  11/27/16 140 lb (63.5 kg)  10/06/15 156 lb (70.8 kg)    Physical Exam  Constitutional: She appears well-developed. No distress.  HENT:  Head: Normocephalic.  Right Ear: External ear normal.  Left Ear: External ear normal.  Nose: Nose normal.  Mouth/Throat: Oropharynx is clear and moist.  Eyes: Conjunctivae are normal. Pupils are equal, round, and reactive to light. Right eye exhibits no discharge. Left eye exhibits no discharge.  Neck: Normal range of motion. Neck supple. No JVD present. No tracheal deviation present. No thyromegaly present.  Cardiovascular: Normal rate, regular rhythm and normal heart sounds.   Pulmonary/Chest: No stridor. No respiratory distress. She has no wheezes.  Abdominal: Soft. Bowel sounds are normal. She exhibits no distension and no mass. There is no tenderness. There is no rebound and no guarding.  Musculoskeletal: She exhibits no edema or tenderness.  Lymphadenopathy:    She has no cervical adenopathy.  Neurological: She displays normal reflexes. No cranial nerve deficit. She exhibits normal muscle tone. Coordination normal.  Skin: No rash noted. No erythema.  Psychiatric: She has a normal mood and affect. Her behavior is normal. Judgment and thought content normal.  R arm shunt  Lab Results  Component Value Date   WBC 7.0 10/25/2012   HGB 12.2 10/25/2012   HCT 37.0 10/25/2012   PLT 288 10/25/2012   GLUCOSE 245 (H) 12/03/2013   CHOL 289 (H) 09/29/2010   TRIG 91.0 09/29/2010   HDL 62.80  09/29/2010   LDLDIRECT 207.8 09/29/2010   ALT 6 01/29/2012   AST 9 01/29/2012   NA 141 12/03/2013   K 5.3 (H) 12/03/2013   CL 105 12/03/2013   CREATININE 1.1 12/03/2013   BUN 27 (H) 12/03/2013   CO2 29 12/03/2013   TSH 1.743 01/29/2012   HGBA1C 14.1 (H) 12/03/2013    Mm Screening Breast Tomo Bilateral  Result Date: 10/13/2016 CLINICAL DATA:  Screening. EXAM: 2D DIGITAL SCREENING BILATERAL MAMMOGRAM WITH CAD AND ADJUNCT TOMO COMPARISON:  Previous exam(s). ACR Breast Density Category c: The breast tissue is heterogeneously dense, which may obscure small masses. FINDINGS: There are no findings suspicious for malignancy. Images were processed with CAD. IMPRESSION: No mammographic evidence of malignancy. A result letter of this screening mammogram will be mailed directly to the patient. RECOMMENDATION: Screening mammogram in one year. (Code:SM-B-01Y) BI-RADS CATEGORY  1: Negative. Electronically Signed   By: Abelardo Diesel M.D.   On: 10/13/2016 09:13    Assessment & Plan:   There are no diagnoses linked to this encounter. I have discontinued Ms. Beitzel's ferrous sulfate and furosemide. I am also having her maintain her aspirin, Vitamin D-3, labetalol, hydrALAZINE, CALCIUM-MAGNESIUM-VITAMIN D PO, insulin NPH-regular Human, amLODipine, diphenhydrAMINE, losartan, isosorbide mononitrate, and torsemide.  Meds ordered this encounter  Medications  . torsemide (DEMADEX) 20 MG tablet    Sig: Take 40 mg by mouth.     Follow-up: No Follow-up on file.  Walker Kehr, MD

## 2016-12-08 NOTE — Assessment & Plan Note (Signed)
Worse On Insulin 70/30 10 u bid Increase am dose

## 2016-12-08 NOTE — Assessment & Plan Note (Signed)
No relapse ASA

## 2016-12-08 NOTE — Assessment & Plan Note (Signed)
Much better 

## 2016-12-08 NOTE — Assessment & Plan Note (Addendum)
ESRD on HD 06/2016 Dr Jerene Bears Considering a kidney transplant

## 2016-12-08 NOTE — Assessment & Plan Note (Signed)
Wt Readings from Last 3 Encounters:  12/08/16 132 lb (59.9 kg)  11/27/16 140 lb (63.5 kg)  10/06/15 156 lb (70.8 kg)  Eating better

## 2016-12-09 DIAGNOSIS — N186 End stage renal disease: Secondary | ICD-10-CM | POA: Diagnosis not present

## 2016-12-12 DIAGNOSIS — N186 End stage renal disease: Secondary | ICD-10-CM | POA: Diagnosis not present

## 2016-12-14 DIAGNOSIS — D631 Anemia in chronic kidney disease: Secondary | ICD-10-CM | POA: Diagnosis not present

## 2016-12-14 DIAGNOSIS — E039 Hypothyroidism, unspecified: Secondary | ICD-10-CM | POA: Diagnosis not present

## 2016-12-14 DIAGNOSIS — N186 End stage renal disease: Secondary | ICD-10-CM | POA: Diagnosis not present

## 2016-12-15 ENCOUNTER — Encounter: Payer: Self-pay | Admitting: Internal Medicine

## 2016-12-15 DIAGNOSIS — D124 Benign neoplasm of descending colon: Secondary | ICD-10-CM | POA: Diagnosis not present

## 2016-12-15 DIAGNOSIS — K635 Polyp of colon: Secondary | ICD-10-CM | POA: Diagnosis not present

## 2016-12-15 DIAGNOSIS — I110 Hypertensive heart disease with heart failure: Secondary | ICD-10-CM | POA: Diagnosis not present

## 2016-12-15 DIAGNOSIS — K2930 Chronic superficial gastritis without bleeding: Secondary | ICD-10-CM | POA: Diagnosis not present

## 2016-12-15 DIAGNOSIS — D509 Iron deficiency anemia, unspecified: Secondary | ICD-10-CM | POA: Diagnosis not present

## 2016-12-15 DIAGNOSIS — K269 Duodenal ulcer, unspecified as acute or chronic, without hemorrhage or perforation: Secondary | ICD-10-CM | POA: Diagnosis not present

## 2016-12-15 DIAGNOSIS — D122 Benign neoplasm of ascending colon: Secondary | ICD-10-CM | POA: Diagnosis not present

## 2016-12-15 DIAGNOSIS — K648 Other hemorrhoids: Secondary | ICD-10-CM | POA: Diagnosis not present

## 2016-12-15 DIAGNOSIS — R197 Diarrhea, unspecified: Secondary | ICD-10-CM | POA: Diagnosis not present

## 2016-12-15 DIAGNOSIS — K3189 Other diseases of stomach and duodenum: Secondary | ICD-10-CM | POA: Diagnosis not present

## 2016-12-15 DIAGNOSIS — K2950 Unspecified chronic gastritis without bleeding: Secondary | ICD-10-CM | POA: Diagnosis not present

## 2016-12-16 DIAGNOSIS — D631 Anemia in chronic kidney disease: Secondary | ICD-10-CM | POA: Diagnosis not present

## 2016-12-16 DIAGNOSIS — N186 End stage renal disease: Secondary | ICD-10-CM | POA: Diagnosis not present

## 2016-12-18 DIAGNOSIS — T82868A Thrombosis of vascular prosthetic devices, implants and grafts, initial encounter: Secondary | ICD-10-CM | POA: Diagnosis not present

## 2016-12-18 DIAGNOSIS — N3000 Acute cystitis without hematuria: Secondary | ICD-10-CM | POA: Diagnosis not present

## 2016-12-18 DIAGNOSIS — T82858A Stenosis of vascular prosthetic devices, implants and grafts, initial encounter: Secondary | ICD-10-CM | POA: Diagnosis not present

## 2016-12-18 DIAGNOSIS — E1100 Type 2 diabetes mellitus with hyperosmolarity without nonketotic hyperglycemic-hyperosmolar coma (NKHHC): Secondary | ICD-10-CM | POA: Diagnosis not present

## 2016-12-18 DIAGNOSIS — N179 Acute kidney failure, unspecified: Secondary | ICD-10-CM | POA: Diagnosis not present

## 2016-12-18 DIAGNOSIS — E1122 Type 2 diabetes mellitus with diabetic chronic kidney disease: Secondary | ICD-10-CM | POA: Diagnosis not present

## 2016-12-18 DIAGNOSIS — I5032 Chronic diastolic (congestive) heart failure: Secondary | ICD-10-CM | POA: Diagnosis not present

## 2016-12-18 DIAGNOSIS — Q600 Renal agenesis, unilateral: Secondary | ICD-10-CM | POA: Diagnosis not present

## 2016-12-18 DIAGNOSIS — I132 Hypertensive heart and chronic kidney disease with heart failure and with stage 5 chronic kidney disease, or end stage renal disease: Secondary | ICD-10-CM | POA: Diagnosis not present

## 2016-12-18 DIAGNOSIS — N186 End stage renal disease: Secondary | ICD-10-CM | POA: Diagnosis not present

## 2016-12-19 DIAGNOSIS — I5032 Chronic diastolic (congestive) heart failure: Secondary | ICD-10-CM | POA: Diagnosis not present

## 2016-12-19 DIAGNOSIS — E1101 Type 2 diabetes mellitus with hyperosmolarity with coma: Secondary | ICD-10-CM | POA: Diagnosis not present

## 2016-12-19 DIAGNOSIS — T82590A Other mechanical complication of surgically created arteriovenous fistula, initial encounter: Secondary | ICD-10-CM | POA: Diagnosis not present

## 2016-12-19 DIAGNOSIS — N179 Acute kidney failure, unspecified: Secondary | ICD-10-CM | POA: Diagnosis not present

## 2016-12-19 DIAGNOSIS — E119 Type 2 diabetes mellitus without complications: Secondary | ICD-10-CM | POA: Diagnosis not present

## 2016-12-19 DIAGNOSIS — I132 Hypertensive heart and chronic kidney disease with heart failure and with stage 5 chronic kidney disease, or end stage renal disease: Secondary | ICD-10-CM | POA: Diagnosis not present

## 2016-12-19 DIAGNOSIS — E1100 Type 2 diabetes mellitus with hyperosmolarity without nonketotic hyperglycemic-hyperosmolar coma (NKHHC): Secondary | ICD-10-CM | POA: Insufficient documentation

## 2016-12-19 DIAGNOSIS — N3000 Acute cystitis without hematuria: Secondary | ICD-10-CM | POA: Diagnosis not present

## 2016-12-19 DIAGNOSIS — E1165 Type 2 diabetes mellitus with hyperglycemia: Secondary | ICD-10-CM | POA: Diagnosis not present

## 2016-12-19 DIAGNOSIS — N390 Urinary tract infection, site not specified: Secondary | ICD-10-CM | POA: Insufficient documentation

## 2016-12-19 DIAGNOSIS — Q600 Renal agenesis, unilateral: Secondary | ICD-10-CM | POA: Diagnosis not present

## 2016-12-19 DIAGNOSIS — Z4901 Encounter for fitting and adjustment of extracorporeal dialysis catheter: Secondary | ICD-10-CM | POA: Diagnosis not present

## 2016-12-19 DIAGNOSIS — R079 Chest pain, unspecified: Secondary | ICD-10-CM | POA: Diagnosis not present

## 2016-12-19 DIAGNOSIS — E1122 Type 2 diabetes mellitus with diabetic chronic kidney disease: Secondary | ICD-10-CM | POA: Diagnosis not present

## 2016-12-19 DIAGNOSIS — I120 Hypertensive chronic kidney disease with stage 5 chronic kidney disease or end stage renal disease: Secondary | ICD-10-CM | POA: Diagnosis not present

## 2016-12-19 DIAGNOSIS — N186 End stage renal disease: Secondary | ICD-10-CM | POA: Diagnosis not present

## 2016-12-19 DIAGNOSIS — Z992 Dependence on renal dialysis: Secondary | ICD-10-CM | POA: Diagnosis not present

## 2016-12-19 DIAGNOSIS — Z87448 Personal history of other diseases of urinary system: Secondary | ICD-10-CM | POA: Diagnosis not present

## 2016-12-19 DIAGNOSIS — T82868A Thrombosis of vascular prosthetic devices, implants and grafts, initial encounter: Secondary | ICD-10-CM | POA: Diagnosis not present

## 2016-12-19 DIAGNOSIS — Z905 Acquired absence of kidney: Secondary | ICD-10-CM | POA: Diagnosis not present

## 2016-12-20 DIAGNOSIS — N186 End stage renal disease: Secondary | ICD-10-CM | POA: Diagnosis not present

## 2016-12-20 DIAGNOSIS — I132 Hypertensive heart and chronic kidney disease with heart failure and with stage 5 chronic kidney disease, or end stage renal disease: Secondary | ICD-10-CM | POA: Diagnosis not present

## 2016-12-20 DIAGNOSIS — E1100 Type 2 diabetes mellitus with hyperosmolarity without nonketotic hyperglycemic-hyperosmolar coma (NKHHC): Secondary | ICD-10-CM | POA: Diagnosis not present

## 2016-12-20 DIAGNOSIS — Q600 Renal agenesis, unilateral: Secondary | ICD-10-CM | POA: Diagnosis not present

## 2016-12-20 DIAGNOSIS — N179 Acute kidney failure, unspecified: Secondary | ICD-10-CM | POA: Diagnosis not present

## 2016-12-20 DIAGNOSIS — E1122 Type 2 diabetes mellitus with diabetic chronic kidney disease: Secondary | ICD-10-CM | POA: Diagnosis not present

## 2016-12-20 DIAGNOSIS — T82868A Thrombosis of vascular prosthetic devices, implants and grafts, initial encounter: Secondary | ICD-10-CM | POA: Diagnosis not present

## 2016-12-20 DIAGNOSIS — I5032 Chronic diastolic (congestive) heart failure: Secondary | ICD-10-CM | POA: Diagnosis not present

## 2016-12-20 DIAGNOSIS — N3000 Acute cystitis without hematuria: Secondary | ICD-10-CM | POA: Diagnosis not present

## 2016-12-21 DIAGNOSIS — D631 Anemia in chronic kidney disease: Secondary | ICD-10-CM | POA: Diagnosis not present

## 2016-12-21 DIAGNOSIS — N186 End stage renal disease: Secondary | ICD-10-CM | POA: Diagnosis not present

## 2016-12-23 DIAGNOSIS — D631 Anemia in chronic kidney disease: Secondary | ICD-10-CM | POA: Diagnosis not present

## 2016-12-23 DIAGNOSIS — N186 End stage renal disease: Secondary | ICD-10-CM | POA: Diagnosis not present

## 2016-12-23 DIAGNOSIS — Z992 Dependence on renal dialysis: Secondary | ICD-10-CM | POA: Diagnosis not present

## 2016-12-25 DIAGNOSIS — Z905 Acquired absence of kidney: Secondary | ICD-10-CM | POA: Diagnosis not present

## 2016-12-25 DIAGNOSIS — Z90711 Acquired absence of uterus with remaining cervical stump: Secondary | ICD-10-CM | POA: Diagnosis not present

## 2016-12-25 DIAGNOSIS — K219 Gastro-esophageal reflux disease without esophagitis: Secondary | ICD-10-CM | POA: Diagnosis not present

## 2016-12-25 DIAGNOSIS — I120 Hypertensive chronic kidney disease with stage 5 chronic kidney disease or end stage renal disease: Secondary | ICD-10-CM | POA: Diagnosis not present

## 2016-12-25 DIAGNOSIS — Z794 Long term (current) use of insulin: Secondary | ICD-10-CM | POA: Diagnosis not present

## 2016-12-25 DIAGNOSIS — E785 Hyperlipidemia, unspecified: Secondary | ICD-10-CM | POA: Diagnosis not present

## 2016-12-25 DIAGNOSIS — Z01818 Encounter for other preprocedural examination: Secondary | ICD-10-CM | POA: Insufficient documentation

## 2016-12-25 DIAGNOSIS — E1122 Type 2 diabetes mellitus with diabetic chronic kidney disease: Secondary | ICD-10-CM | POA: Diagnosis not present

## 2016-12-25 DIAGNOSIS — Z8673 Personal history of transient ischemic attack (TIA), and cerebral infarction without residual deficits: Secondary | ICD-10-CM | POA: Diagnosis not present

## 2016-12-25 DIAGNOSIS — Z992 Dependence on renal dialysis: Secondary | ICD-10-CM | POA: Diagnosis not present

## 2016-12-25 DIAGNOSIS — N186 End stage renal disease: Secondary | ICD-10-CM | POA: Diagnosis not present

## 2016-12-26 DIAGNOSIS — N2581 Secondary hyperparathyroidism of renal origin: Secondary | ICD-10-CM | POA: Diagnosis not present

## 2016-12-26 DIAGNOSIS — E8779 Other fluid overload: Secondary | ICD-10-CM | POA: Diagnosis not present

## 2016-12-26 DIAGNOSIS — N186 End stage renal disease: Secondary | ICD-10-CM | POA: Diagnosis not present

## 2016-12-26 DIAGNOSIS — D631 Anemia in chronic kidney disease: Secondary | ICD-10-CM | POA: Diagnosis not present

## 2016-12-26 DIAGNOSIS — Z23 Encounter for immunization: Secondary | ICD-10-CM | POA: Diagnosis not present

## 2016-12-28 DIAGNOSIS — E8779 Other fluid overload: Secondary | ICD-10-CM | POA: Diagnosis not present

## 2016-12-28 DIAGNOSIS — Z23 Encounter for immunization: Secondary | ICD-10-CM | POA: Diagnosis not present

## 2016-12-28 DIAGNOSIS — N2581 Secondary hyperparathyroidism of renal origin: Secondary | ICD-10-CM | POA: Diagnosis not present

## 2016-12-28 DIAGNOSIS — N186 End stage renal disease: Secondary | ICD-10-CM | POA: Diagnosis not present

## 2016-12-28 DIAGNOSIS — D631 Anemia in chronic kidney disease: Secondary | ICD-10-CM | POA: Diagnosis not present

## 2016-12-28 DIAGNOSIS — E1165 Type 2 diabetes mellitus with hyperglycemia: Secondary | ICD-10-CM | POA: Diagnosis not present

## 2016-12-29 ENCOUNTER — Inpatient Hospital Stay: Payer: Medicare HMO | Admitting: Nurse Practitioner

## 2016-12-29 DIAGNOSIS — D631 Anemia in chronic kidney disease: Secondary | ICD-10-CM | POA: Diagnosis not present

## 2016-12-29 DIAGNOSIS — E8779 Other fluid overload: Secondary | ICD-10-CM | POA: Diagnosis not present

## 2016-12-29 DIAGNOSIS — Z23 Encounter for immunization: Secondary | ICD-10-CM | POA: Diagnosis not present

## 2016-12-29 DIAGNOSIS — N2581 Secondary hyperparathyroidism of renal origin: Secondary | ICD-10-CM | POA: Diagnosis not present

## 2016-12-29 DIAGNOSIS — N186 End stage renal disease: Secondary | ICD-10-CM | POA: Diagnosis not present

## 2016-12-30 DIAGNOSIS — E8779 Other fluid overload: Secondary | ICD-10-CM | POA: Diagnosis not present

## 2016-12-30 DIAGNOSIS — N186 End stage renal disease: Secondary | ICD-10-CM | POA: Diagnosis not present

## 2016-12-30 DIAGNOSIS — N2581 Secondary hyperparathyroidism of renal origin: Secondary | ICD-10-CM | POA: Diagnosis not present

## 2016-12-30 DIAGNOSIS — D631 Anemia in chronic kidney disease: Secondary | ICD-10-CM | POA: Diagnosis not present

## 2016-12-30 DIAGNOSIS — Z23 Encounter for immunization: Secondary | ICD-10-CM | POA: Diagnosis not present

## 2017-01-02 DIAGNOSIS — Z23 Encounter for immunization: Secondary | ICD-10-CM | POA: Diagnosis not present

## 2017-01-02 DIAGNOSIS — N186 End stage renal disease: Secondary | ICD-10-CM | POA: Diagnosis not present

## 2017-01-02 DIAGNOSIS — D631 Anemia in chronic kidney disease: Secondary | ICD-10-CM | POA: Diagnosis not present

## 2017-01-02 DIAGNOSIS — N2581 Secondary hyperparathyroidism of renal origin: Secondary | ICD-10-CM | POA: Diagnosis not present

## 2017-01-02 DIAGNOSIS — E8779 Other fluid overload: Secondary | ICD-10-CM | POA: Diagnosis not present

## 2017-01-04 DIAGNOSIS — D631 Anemia in chronic kidney disease: Secondary | ICD-10-CM | POA: Diagnosis not present

## 2017-01-04 DIAGNOSIS — N186 End stage renal disease: Secondary | ICD-10-CM | POA: Diagnosis not present

## 2017-01-04 DIAGNOSIS — D509 Iron deficiency anemia, unspecified: Secondary | ICD-10-CM | POA: Diagnosis not present

## 2017-01-05 ENCOUNTER — Telehealth: Payer: Self-pay | Admitting: Nurse Practitioner

## 2017-01-05 NOTE — Telephone Encounter (Signed)
Spoke with patient.  Did discover this was an actual missed visit that was probably scheduled by [place] [facility].  Told patient we would not charge no show fee for this visit.

## 2017-01-05 NOTE — Telephone Encounter (Signed)
[name] can you look into this please.

## 2017-01-05 NOTE — Telephone Encounter (Signed)
Pt called stating she received a letter stating she missed an appt on 7/6, I looked in her past appts and confirmed this, she states she came to the appt that day and spoke to [place] and has a AVS from this appt. I do not  show her being checked in or arrived or anything. She states she will bring the AVS in on Monday 7/16 to prove she was here and saw Baldo Ash and to prevent her from being charged for a missed appt.

## 2017-01-06 DIAGNOSIS — D631 Anemia in chronic kidney disease: Secondary | ICD-10-CM | POA: Diagnosis not present

## 2017-01-06 DIAGNOSIS — N186 End stage renal disease: Secondary | ICD-10-CM | POA: Diagnosis not present

## 2017-01-06 DIAGNOSIS — D509 Iron deficiency anemia, unspecified: Secondary | ICD-10-CM | POA: Diagnosis not present

## 2017-01-09 DIAGNOSIS — N186 End stage renal disease: Secondary | ICD-10-CM | POA: Diagnosis not present

## 2017-01-09 DIAGNOSIS — D509 Iron deficiency anemia, unspecified: Secondary | ICD-10-CM | POA: Diagnosis not present

## 2017-01-09 DIAGNOSIS — D631 Anemia in chronic kidney disease: Secondary | ICD-10-CM | POA: Diagnosis not present

## 2017-01-11 DIAGNOSIS — D631 Anemia in chronic kidney disease: Secondary | ICD-10-CM | POA: Diagnosis not present

## 2017-01-11 DIAGNOSIS — D509 Iron deficiency anemia, unspecified: Secondary | ICD-10-CM | POA: Diagnosis not present

## 2017-01-11 DIAGNOSIS — N186 End stage renal disease: Secondary | ICD-10-CM | POA: Diagnosis not present

## 2017-01-12 DIAGNOSIS — D509 Iron deficiency anemia, unspecified: Secondary | ICD-10-CM | POA: Diagnosis not present

## 2017-01-12 DIAGNOSIS — D631 Anemia in chronic kidney disease: Secondary | ICD-10-CM | POA: Diagnosis not present

## 2017-01-12 DIAGNOSIS — N186 End stage renal disease: Secondary | ICD-10-CM | POA: Diagnosis not present

## 2017-01-16 DIAGNOSIS — N186 End stage renal disease: Secondary | ICD-10-CM | POA: Diagnosis not present

## 2017-01-16 DIAGNOSIS — D509 Iron deficiency anemia, unspecified: Secondary | ICD-10-CM | POA: Diagnosis not present

## 2017-01-17 NOTE — Telephone Encounter (Signed)
I have requested the NS fee for 12/29/16 to be voided as a courtesy.

## 2017-01-17 NOTE — Telephone Encounter (Signed)
Tried to contact patient.  Phone call would not go through.

## 2017-01-18 DIAGNOSIS — N186 End stage renal disease: Secondary | ICD-10-CM | POA: Diagnosis not present

## 2017-01-18 DIAGNOSIS — D509 Iron deficiency anemia, unspecified: Secondary | ICD-10-CM | POA: Diagnosis not present

## 2017-01-20 DIAGNOSIS — D509 Iron deficiency anemia, unspecified: Secondary | ICD-10-CM | POA: Diagnosis not present

## 2017-01-20 DIAGNOSIS — N186 End stage renal disease: Secondary | ICD-10-CM | POA: Diagnosis not present

## 2017-01-21 DIAGNOSIS — Z880 Allergy status to penicillin: Secondary | ICD-10-CM | POA: Diagnosis not present

## 2017-01-21 DIAGNOSIS — Z8673 Personal history of transient ischemic attack (TIA), and cerebral infarction without residual deficits: Secondary | ICD-10-CM | POA: Diagnosis not present

## 2017-01-21 DIAGNOSIS — S81811A Laceration without foreign body, right lower leg, initial encounter: Secondary | ICD-10-CM | POA: Diagnosis not present

## 2017-01-21 DIAGNOSIS — I509 Heart failure, unspecified: Secondary | ICD-10-CM | POA: Diagnosis not present

## 2017-01-21 DIAGNOSIS — I110 Hypertensive heart disease with heart failure: Secondary | ICD-10-CM | POA: Diagnosis not present

## 2017-01-21 DIAGNOSIS — N19 Unspecified kidney failure: Secondary | ICD-10-CM | POA: Diagnosis not present

## 2017-01-21 DIAGNOSIS — E119 Type 2 diabetes mellitus without complications: Secondary | ICD-10-CM | POA: Diagnosis not present

## 2017-01-21 DIAGNOSIS — Z882 Allergy status to sulfonamides status: Secondary | ICD-10-CM | POA: Diagnosis not present

## 2017-01-21 DIAGNOSIS — G629 Polyneuropathy, unspecified: Secondary | ICD-10-CM | POA: Diagnosis not present

## 2017-01-23 DIAGNOSIS — D509 Iron deficiency anemia, unspecified: Secondary | ICD-10-CM | POA: Diagnosis not present

## 2017-01-23 DIAGNOSIS — N186 End stage renal disease: Secondary | ICD-10-CM | POA: Diagnosis not present

## 2017-01-23 DIAGNOSIS — Z992 Dependence on renal dialysis: Secondary | ICD-10-CM | POA: Diagnosis not present

## 2017-01-25 DIAGNOSIS — D631 Anemia in chronic kidney disease: Secondary | ICD-10-CM | POA: Diagnosis not present

## 2017-01-25 DIAGNOSIS — E1165 Type 2 diabetes mellitus with hyperglycemia: Secondary | ICD-10-CM | POA: Diagnosis not present

## 2017-01-25 DIAGNOSIS — N2581 Secondary hyperparathyroidism of renal origin: Secondary | ICD-10-CM | POA: Diagnosis not present

## 2017-01-25 DIAGNOSIS — N186 End stage renal disease: Secondary | ICD-10-CM | POA: Diagnosis not present

## 2017-01-25 DIAGNOSIS — D509 Iron deficiency anemia, unspecified: Secondary | ICD-10-CM | POA: Diagnosis not present

## 2017-01-27 DIAGNOSIS — N186 End stage renal disease: Secondary | ICD-10-CM | POA: Diagnosis not present

## 2017-01-27 DIAGNOSIS — D509 Iron deficiency anemia, unspecified: Secondary | ICD-10-CM | POA: Diagnosis not present

## 2017-01-27 DIAGNOSIS — N2581 Secondary hyperparathyroidism of renal origin: Secondary | ICD-10-CM | POA: Diagnosis not present

## 2017-01-27 DIAGNOSIS — D631 Anemia in chronic kidney disease: Secondary | ICD-10-CM | POA: Diagnosis not present

## 2017-01-29 DIAGNOSIS — N184 Chronic kidney disease, stage 4 (severe): Secondary | ICD-10-CM | POA: Diagnosis not present

## 2017-01-29 DIAGNOSIS — Z794 Long term (current) use of insulin: Secondary | ICD-10-CM | POA: Diagnosis not present

## 2017-01-29 DIAGNOSIS — T82510A Breakdown (mechanical) of surgically created arteriovenous fistula, initial encounter: Secondary | ICD-10-CM | POA: Diagnosis not present

## 2017-01-29 DIAGNOSIS — E119 Type 2 diabetes mellitus without complications: Secondary | ICD-10-CM | POA: Diagnosis not present

## 2017-01-30 DIAGNOSIS — D631 Anemia in chronic kidney disease: Secondary | ICD-10-CM | POA: Diagnosis not present

## 2017-01-30 DIAGNOSIS — N186 End stage renal disease: Secondary | ICD-10-CM | POA: Diagnosis not present

## 2017-01-30 DIAGNOSIS — D509 Iron deficiency anemia, unspecified: Secondary | ICD-10-CM | POA: Diagnosis not present

## 2017-01-30 DIAGNOSIS — N2581 Secondary hyperparathyroidism of renal origin: Secondary | ICD-10-CM | POA: Diagnosis not present

## 2017-02-01 DIAGNOSIS — D509 Iron deficiency anemia, unspecified: Secondary | ICD-10-CM | POA: Diagnosis not present

## 2017-02-01 DIAGNOSIS — N2581 Secondary hyperparathyroidism of renal origin: Secondary | ICD-10-CM | POA: Diagnosis not present

## 2017-02-01 DIAGNOSIS — N186 End stage renal disease: Secondary | ICD-10-CM | POA: Diagnosis not present

## 2017-02-01 DIAGNOSIS — D631 Anemia in chronic kidney disease: Secondary | ICD-10-CM | POA: Diagnosis not present

## 2017-02-03 DIAGNOSIS — D631 Anemia in chronic kidney disease: Secondary | ICD-10-CM | POA: Diagnosis not present

## 2017-02-03 DIAGNOSIS — T8249XA Other complication of vascular dialysis catheter, initial encounter: Secondary | ICD-10-CM | POA: Diagnosis not present

## 2017-02-03 DIAGNOSIS — N186 End stage renal disease: Secondary | ICD-10-CM | POA: Diagnosis not present

## 2017-02-05 DIAGNOSIS — Z01818 Encounter for other preprocedural examination: Secondary | ICD-10-CM | POA: Diagnosis not present

## 2017-02-05 DIAGNOSIS — N185 Chronic kidney disease, stage 5: Secondary | ICD-10-CM | POA: Diagnosis not present

## 2017-02-05 DIAGNOSIS — E1122 Type 2 diabetes mellitus with diabetic chronic kidney disease: Secondary | ICD-10-CM | POA: Diagnosis not present

## 2017-02-05 DIAGNOSIS — R079 Chest pain, unspecified: Secondary | ICD-10-CM | POA: Diagnosis not present

## 2017-02-05 DIAGNOSIS — K219 Gastro-esophageal reflux disease without esophagitis: Secondary | ICD-10-CM | POA: Diagnosis not present

## 2017-02-05 DIAGNOSIS — E119 Type 2 diabetes mellitus without complications: Secondary | ICD-10-CM | POA: Diagnosis not present

## 2017-02-05 DIAGNOSIS — I120 Hypertensive chronic kidney disease with stage 5 chronic kidney disease or end stage renal disease: Secondary | ICD-10-CM | POA: Diagnosis not present

## 2017-02-05 DIAGNOSIS — Z794 Long term (current) use of insulin: Secondary | ICD-10-CM | POA: Diagnosis not present

## 2017-02-05 DIAGNOSIS — Z992 Dependence on renal dialysis: Secondary | ICD-10-CM | POA: Diagnosis not present

## 2017-02-05 DIAGNOSIS — I080 Rheumatic disorders of both mitral and aortic valves: Secondary | ICD-10-CM | POA: Diagnosis not present

## 2017-02-05 DIAGNOSIS — I517 Cardiomegaly: Secondary | ICD-10-CM | POA: Diagnosis not present

## 2017-02-05 DIAGNOSIS — E784 Other hyperlipidemia: Secondary | ICD-10-CM | POA: Diagnosis not present

## 2017-02-05 DIAGNOSIS — N186 End stage renal disease: Secondary | ICD-10-CM | POA: Diagnosis not present

## 2017-02-06 DIAGNOSIS — N186 End stage renal disease: Secondary | ICD-10-CM | POA: Diagnosis not present

## 2017-02-06 DIAGNOSIS — D631 Anemia in chronic kidney disease: Secondary | ICD-10-CM | POA: Diagnosis not present

## 2017-02-06 DIAGNOSIS — T8249XA Other complication of vascular dialysis catheter, initial encounter: Secondary | ICD-10-CM | POA: Diagnosis not present

## 2017-02-08 DIAGNOSIS — D631 Anemia in chronic kidney disease: Secondary | ICD-10-CM | POA: Diagnosis not present

## 2017-02-08 DIAGNOSIS — N186 End stage renal disease: Secondary | ICD-10-CM | POA: Diagnosis not present

## 2017-02-08 DIAGNOSIS — T8249XA Other complication of vascular dialysis catheter, initial encounter: Secondary | ICD-10-CM | POA: Diagnosis not present

## 2017-02-10 DIAGNOSIS — T8249XA Other complication of vascular dialysis catheter, initial encounter: Secondary | ICD-10-CM | POA: Diagnosis not present

## 2017-02-10 DIAGNOSIS — D631 Anemia in chronic kidney disease: Secondary | ICD-10-CM | POA: Diagnosis not present

## 2017-02-10 DIAGNOSIS — N186 End stage renal disease: Secondary | ICD-10-CM | POA: Diagnosis not present

## 2017-02-12 DIAGNOSIS — N186 End stage renal disease: Secondary | ICD-10-CM | POA: Diagnosis not present

## 2017-02-12 DIAGNOSIS — T8249XA Other complication of vascular dialysis catheter, initial encounter: Secondary | ICD-10-CM | POA: Diagnosis not present

## 2017-02-12 DIAGNOSIS — D631 Anemia in chronic kidney disease: Secondary | ICD-10-CM | POA: Diagnosis not present

## 2017-02-15 DIAGNOSIS — D631 Anemia in chronic kidney disease: Secondary | ICD-10-CM | POA: Diagnosis not present

## 2017-02-15 DIAGNOSIS — Z23 Encounter for immunization: Secondary | ICD-10-CM | POA: Diagnosis not present

## 2017-02-15 DIAGNOSIS — N186 End stage renal disease: Secondary | ICD-10-CM | POA: Diagnosis not present

## 2017-02-19 DIAGNOSIS — D631 Anemia in chronic kidney disease: Secondary | ICD-10-CM | POA: Diagnosis not present

## 2017-02-19 DIAGNOSIS — Z23 Encounter for immunization: Secondary | ICD-10-CM | POA: Diagnosis not present

## 2017-02-19 DIAGNOSIS — N186 End stage renal disease: Secondary | ICD-10-CM | POA: Diagnosis not present

## 2017-02-20 DIAGNOSIS — D631 Anemia in chronic kidney disease: Secondary | ICD-10-CM | POA: Diagnosis not present

## 2017-02-20 DIAGNOSIS — Z23 Encounter for immunization: Secondary | ICD-10-CM | POA: Diagnosis not present

## 2017-02-20 DIAGNOSIS — N186 End stage renal disease: Secondary | ICD-10-CM | POA: Diagnosis not present

## 2017-02-21 DIAGNOSIS — Z794 Long term (current) use of insulin: Secondary | ICD-10-CM | POA: Diagnosis not present

## 2017-02-21 DIAGNOSIS — E1122 Type 2 diabetes mellitus with diabetic chronic kidney disease: Secondary | ICD-10-CM | POA: Diagnosis not present

## 2017-02-21 DIAGNOSIS — I120 Hypertensive chronic kidney disease with stage 5 chronic kidney disease or end stage renal disease: Secondary | ICD-10-CM | POA: Diagnosis not present

## 2017-02-21 DIAGNOSIS — N186 End stage renal disease: Secondary | ICD-10-CM | POA: Diagnosis not present

## 2017-02-21 DIAGNOSIS — E785 Hyperlipidemia, unspecified: Secondary | ICD-10-CM | POA: Diagnosis not present

## 2017-02-21 DIAGNOSIS — Z992 Dependence on renal dialysis: Secondary | ICD-10-CM | POA: Diagnosis not present

## 2017-02-22 DIAGNOSIS — N186 End stage renal disease: Secondary | ICD-10-CM | POA: Diagnosis not present

## 2017-02-22 DIAGNOSIS — D631 Anemia in chronic kidney disease: Secondary | ICD-10-CM | POA: Diagnosis not present

## 2017-02-22 DIAGNOSIS — R9439 Abnormal result of other cardiovascular function study: Secondary | ICD-10-CM | POA: Insufficient documentation

## 2017-02-22 DIAGNOSIS — Z23 Encounter for immunization: Secondary | ICD-10-CM | POA: Diagnosis not present

## 2017-02-23 DIAGNOSIS — N186 End stage renal disease: Secondary | ICD-10-CM | POA: Diagnosis not present

## 2017-02-23 DIAGNOSIS — Z992 Dependence on renal dialysis: Secondary | ICD-10-CM | POA: Diagnosis not present

## 2017-02-24 DIAGNOSIS — D631 Anemia in chronic kidney disease: Secondary | ICD-10-CM | POA: Diagnosis not present

## 2017-02-24 DIAGNOSIS — N186 End stage renal disease: Secondary | ICD-10-CM | POA: Diagnosis not present

## 2017-02-24 DIAGNOSIS — N2581 Secondary hyperparathyroidism of renal origin: Secondary | ICD-10-CM | POA: Diagnosis not present

## 2017-02-27 DIAGNOSIS — N186 End stage renal disease: Secondary | ICD-10-CM | POA: Diagnosis not present

## 2017-02-27 DIAGNOSIS — D631 Anemia in chronic kidney disease: Secondary | ICD-10-CM | POA: Diagnosis not present

## 2017-02-27 DIAGNOSIS — N2581 Secondary hyperparathyroidism of renal origin: Secondary | ICD-10-CM | POA: Diagnosis not present

## 2017-03-01 DIAGNOSIS — E1165 Type 2 diabetes mellitus with hyperglycemia: Secondary | ICD-10-CM | POA: Diagnosis not present

## 2017-03-01 DIAGNOSIS — N186 End stage renal disease: Secondary | ICD-10-CM | POA: Diagnosis not present

## 2017-03-01 DIAGNOSIS — N2581 Secondary hyperparathyroidism of renal origin: Secondary | ICD-10-CM | POA: Diagnosis not present

## 2017-03-01 DIAGNOSIS — D631 Anemia in chronic kidney disease: Secondary | ICD-10-CM | POA: Diagnosis not present

## 2017-03-03 DIAGNOSIS — D631 Anemia in chronic kidney disease: Secondary | ICD-10-CM | POA: Diagnosis not present

## 2017-03-03 DIAGNOSIS — N2581 Secondary hyperparathyroidism of renal origin: Secondary | ICD-10-CM | POA: Diagnosis not present

## 2017-03-03 DIAGNOSIS — N186 End stage renal disease: Secondary | ICD-10-CM | POA: Diagnosis not present

## 2017-03-06 DIAGNOSIS — N186 End stage renal disease: Secondary | ICD-10-CM | POA: Diagnosis not present

## 2017-03-07 DIAGNOSIS — N186 End stage renal disease: Secondary | ICD-10-CM | POA: Diagnosis not present

## 2017-03-07 DIAGNOSIS — E785 Hyperlipidemia, unspecified: Secondary | ICD-10-CM | POA: Diagnosis not present

## 2017-03-07 DIAGNOSIS — I2582 Chronic total occlusion of coronary artery: Secondary | ICD-10-CM | POA: Diagnosis not present

## 2017-03-07 DIAGNOSIS — Z992 Dependence on renal dialysis: Secondary | ICD-10-CM | POA: Diagnosis not present

## 2017-03-07 DIAGNOSIS — E1122 Type 2 diabetes mellitus with diabetic chronic kidney disease: Secondary | ICD-10-CM | POA: Diagnosis not present

## 2017-03-07 DIAGNOSIS — Z8673 Personal history of transient ischemic attack (TIA), and cerebral infarction without residual deficits: Secondary | ICD-10-CM | POA: Diagnosis not present

## 2017-03-07 DIAGNOSIS — Z794 Long term (current) use of insulin: Secondary | ICD-10-CM | POA: Diagnosis not present

## 2017-03-07 DIAGNOSIS — I132 Hypertensive heart and chronic kidney disease with heart failure and with stage 5 chronic kidney disease, or end stage renal disease: Secondary | ICD-10-CM | POA: Diagnosis not present

## 2017-03-07 DIAGNOSIS — I2510 Atherosclerotic heart disease of native coronary artery without angina pectoris: Secondary | ICD-10-CM | POA: Diagnosis not present

## 2017-03-07 DIAGNOSIS — R9439 Abnormal result of other cardiovascular function study: Secondary | ICD-10-CM | POA: Diagnosis not present

## 2017-03-08 DIAGNOSIS — N186 End stage renal disease: Secondary | ICD-10-CM | POA: Diagnosis not present

## 2017-03-09 DIAGNOSIS — D631 Anemia in chronic kidney disease: Secondary | ICD-10-CM | POA: Diagnosis not present

## 2017-03-09 DIAGNOSIS — I5032 Chronic diastolic (congestive) heart failure: Secondary | ICD-10-CM | POA: Diagnosis not present

## 2017-03-09 DIAGNOSIS — I132 Hypertensive heart and chronic kidney disease with heart failure and with stage 5 chronic kidney disease, or end stage renal disease: Secondary | ICD-10-CM | POA: Diagnosis not present

## 2017-03-09 DIAGNOSIS — R579 Shock, unspecified: Secondary | ICD-10-CM | POA: Diagnosis not present

## 2017-03-09 DIAGNOSIS — N186 End stage renal disease: Secondary | ICD-10-CM | POA: Diagnosis not present

## 2017-03-09 DIAGNOSIS — D62 Acute posthemorrhagic anemia: Secondary | ICD-10-CM | POA: Diagnosis not present

## 2017-03-09 DIAGNOSIS — Q600 Renal agenesis, unilateral: Secondary | ICD-10-CM | POA: Diagnosis not present

## 2017-03-09 DIAGNOSIS — I2510 Atherosclerotic heart disease of native coronary artery without angina pectoris: Secondary | ICD-10-CM | POA: Diagnosis not present

## 2017-03-09 DIAGNOSIS — Z01810 Encounter for preprocedural cardiovascular examination: Secondary | ICD-10-CM | POA: Diagnosis not present

## 2017-03-09 DIAGNOSIS — E1122 Type 2 diabetes mellitus with diabetic chronic kidney disease: Secondary | ICD-10-CM | POA: Diagnosis not present

## 2017-03-10 DIAGNOSIS — N186 End stage renal disease: Secondary | ICD-10-CM | POA: Diagnosis not present

## 2017-03-13 DIAGNOSIS — N186 End stage renal disease: Secondary | ICD-10-CM | POA: Diagnosis not present

## 2017-03-15 DIAGNOSIS — N186 End stage renal disease: Secondary | ICD-10-CM | POA: Diagnosis not present

## 2017-03-16 DIAGNOSIS — Z978 Presence of other specified devices: Secondary | ICD-10-CM | POA: Diagnosis not present

## 2017-03-16 DIAGNOSIS — N185 Chronic kidney disease, stage 5: Secondary | ICD-10-CM | POA: Diagnosis not present

## 2017-03-16 DIAGNOSIS — J810 Acute pulmonary edema: Secondary | ICD-10-CM | POA: Diagnosis not present

## 2017-03-16 DIAGNOSIS — D631 Anemia in chronic kidney disease: Secondary | ICD-10-CM | POA: Diagnosis not present

## 2017-03-16 DIAGNOSIS — R571 Hypovolemic shock: Secondary | ICD-10-CM | POA: Diagnosis not present

## 2017-03-16 DIAGNOSIS — Z01810 Encounter for preprocedural cardiovascular examination: Secondary | ICD-10-CM | POA: Diagnosis not present

## 2017-03-16 DIAGNOSIS — I132 Hypertensive heart and chronic kidney disease with heart failure and with stage 5 chronic kidney disease, or end stage renal disease: Secondary | ICD-10-CM | POA: Diagnosis not present

## 2017-03-16 DIAGNOSIS — J90 Pleural effusion, not elsewhere classified: Secondary | ICD-10-CM | POA: Diagnosis not present

## 2017-03-16 DIAGNOSIS — I517 Cardiomegaly: Secondary | ICD-10-CM | POA: Diagnosis not present

## 2017-03-16 DIAGNOSIS — Z48812 Encounter for surgical aftercare following surgery on the circulatory system: Secondary | ICD-10-CM | POA: Diagnosis not present

## 2017-03-16 DIAGNOSIS — R9431 Abnormal electrocardiogram [ECG] [EKG]: Secondary | ICD-10-CM | POA: Diagnosis not present

## 2017-03-16 DIAGNOSIS — J95821 Acute postprocedural respiratory failure: Secondary | ICD-10-CM | POA: Diagnosis not present

## 2017-03-16 DIAGNOSIS — I509 Heart failure, unspecified: Secondary | ICD-10-CM | POA: Diagnosis not present

## 2017-03-16 DIAGNOSIS — Z452 Encounter for adjustment and management of vascular access device: Secondary | ICD-10-CM | POA: Diagnosis not present

## 2017-03-16 DIAGNOSIS — J811 Chronic pulmonary edema: Secondary | ICD-10-CM | POA: Diagnosis not present

## 2017-03-16 DIAGNOSIS — I5032 Chronic diastolic (congestive) heart failure: Secondary | ICD-10-CM | POA: Diagnosis not present

## 2017-03-16 DIAGNOSIS — I25810 Atherosclerosis of coronary artery bypass graft(s) without angina pectoris: Secondary | ICD-10-CM | POA: Diagnosis not present

## 2017-03-16 DIAGNOSIS — E1165 Type 2 diabetes mellitus with hyperglycemia: Secondary | ICD-10-CM | POA: Diagnosis not present

## 2017-03-16 DIAGNOSIS — I10 Essential (primary) hypertension: Secondary | ICD-10-CM | POA: Diagnosis not present

## 2017-03-16 DIAGNOSIS — Z431 Encounter for attention to gastrostomy: Secondary | ICD-10-CM | POA: Diagnosis not present

## 2017-03-16 DIAGNOSIS — Q600 Renal agenesis, unilateral: Secondary | ICD-10-CM | POA: Diagnosis not present

## 2017-03-16 DIAGNOSIS — I959 Hypotension, unspecified: Secondary | ICD-10-CM | POA: Diagnosis not present

## 2017-03-16 DIAGNOSIS — I120 Hypertensive chronic kidney disease with stage 5 chronic kidney disease or end stage renal disease: Secondary | ICD-10-CM | POA: Diagnosis not present

## 2017-03-16 DIAGNOSIS — I2510 Atherosclerotic heart disease of native coronary artery without angina pectoris: Secondary | ICD-10-CM | POA: Diagnosis not present

## 2017-03-16 DIAGNOSIS — J9811 Atelectasis: Secondary | ICD-10-CM | POA: Diagnosis not present

## 2017-03-16 DIAGNOSIS — D696 Thrombocytopenia, unspecified: Secondary | ICD-10-CM | POA: Diagnosis not present

## 2017-03-16 DIAGNOSIS — R110 Nausea: Secondary | ICD-10-CM | POA: Diagnosis not present

## 2017-03-16 DIAGNOSIS — E1122 Type 2 diabetes mellitus with diabetic chronic kidney disease: Secondary | ICD-10-CM | POA: Diagnosis not present

## 2017-03-16 DIAGNOSIS — E871 Hypo-osmolality and hyponatremia: Secondary | ICD-10-CM | POA: Diagnosis not present

## 2017-03-16 DIAGNOSIS — Z430 Encounter for attention to tracheostomy: Secondary | ICD-10-CM | POA: Diagnosis not present

## 2017-03-16 DIAGNOSIS — R579 Shock, unspecified: Secondary | ICD-10-CM | POA: Diagnosis not present

## 2017-03-16 DIAGNOSIS — Z951 Presence of aortocoronary bypass graft: Secondary | ICD-10-CM | POA: Diagnosis not present

## 2017-03-16 DIAGNOSIS — Z992 Dependence on renal dialysis: Secondary | ICD-10-CM | POA: Diagnosis not present

## 2017-03-16 DIAGNOSIS — G8918 Other acute postprocedural pain: Secondary | ICD-10-CM | POA: Diagnosis not present

## 2017-03-16 DIAGNOSIS — N186 End stage renal disease: Secondary | ICD-10-CM | POA: Diagnosis not present

## 2017-03-16 DIAGNOSIS — N179 Acute kidney failure, unspecified: Secondary | ICD-10-CM | POA: Diagnosis not present

## 2017-03-16 DIAGNOSIS — D62 Acute posthemorrhagic anemia: Secondary | ICD-10-CM | POA: Diagnosis not present

## 2017-03-16 DIAGNOSIS — R918 Other nonspecific abnormal finding of lung field: Secondary | ICD-10-CM | POA: Diagnosis not present

## 2017-03-22 ENCOUNTER — Telehealth: Payer: Self-pay | Admitting: Internal Medicine

## 2017-03-22 DIAGNOSIS — N186 End stage renal disease: Secondary | ICD-10-CM | POA: Diagnosis not present

## 2017-03-22 NOTE — Telephone Encounter (Signed)
Called and the patient needs insulin syringe needles,  Would like to receive these from a patient assistance program where she can receive samples because she cannot afford them,  Please call [phone] to order needles.

## 2017-03-24 DIAGNOSIS — N186 End stage renal disease: Secondary | ICD-10-CM | POA: Diagnosis not present

## 2017-03-25 DIAGNOSIS — I25119 Atherosclerotic heart disease of native coronary artery with unspecified angina pectoris: Secondary | ICD-10-CM | POA: Diagnosis not present

## 2017-03-25 DIAGNOSIS — D631 Anemia in chronic kidney disease: Secondary | ICD-10-CM | POA: Diagnosis not present

## 2017-03-25 DIAGNOSIS — Z951 Presence of aortocoronary bypass graft: Secondary | ICD-10-CM | POA: Diagnosis not present

## 2017-03-25 DIAGNOSIS — I5030 Unspecified diastolic (congestive) heart failure: Secondary | ICD-10-CM | POA: Diagnosis not present

## 2017-03-25 DIAGNOSIS — E1122 Type 2 diabetes mellitus with diabetic chronic kidney disease: Secondary | ICD-10-CM | POA: Diagnosis not present

## 2017-03-25 DIAGNOSIS — Z992 Dependence on renal dialysis: Secondary | ICD-10-CM | POA: Diagnosis not present

## 2017-03-25 DIAGNOSIS — Z48812 Encounter for surgical aftercare following surgery on the circulatory system: Secondary | ICD-10-CM | POA: Diagnosis not present

## 2017-03-25 DIAGNOSIS — I132 Hypertensive heart and chronic kidney disease with heart failure and with stage 5 chronic kidney disease, or end stage renal disease: Secondary | ICD-10-CM | POA: Diagnosis not present

## 2017-03-25 DIAGNOSIS — N186 End stage renal disease: Secondary | ICD-10-CM | POA: Diagnosis not present

## 2017-03-26 DIAGNOSIS — E1122 Type 2 diabetes mellitus with diabetic chronic kidney disease: Secondary | ICD-10-CM | POA: Diagnosis not present

## 2017-03-26 DIAGNOSIS — D631 Anemia in chronic kidney disease: Secondary | ICD-10-CM | POA: Diagnosis not present

## 2017-03-26 DIAGNOSIS — N186 End stage renal disease: Secondary | ICD-10-CM | POA: Diagnosis not present

## 2017-03-26 DIAGNOSIS — I132 Hypertensive heart and chronic kidney disease with heart failure and with stage 5 chronic kidney disease, or end stage renal disease: Secondary | ICD-10-CM | POA: Diagnosis not present

## 2017-03-26 DIAGNOSIS — Z48812 Encounter for surgical aftercare following surgery on the circulatory system: Secondary | ICD-10-CM | POA: Diagnosis not present

## 2017-03-26 DIAGNOSIS — Z951 Presence of aortocoronary bypass graft: Secondary | ICD-10-CM | POA: Diagnosis not present

## 2017-03-26 DIAGNOSIS — I5030 Unspecified diastolic (congestive) heart failure: Secondary | ICD-10-CM | POA: Diagnosis not present

## 2017-03-26 DIAGNOSIS — I25119 Atherosclerotic heart disease of native coronary artery with unspecified angina pectoris: Secondary | ICD-10-CM | POA: Diagnosis not present

## 2017-03-26 DIAGNOSIS — Z992 Dependence on renal dialysis: Secondary | ICD-10-CM | POA: Diagnosis not present

## 2017-03-27 DIAGNOSIS — D631 Anemia in chronic kidney disease: Secondary | ICD-10-CM | POA: Diagnosis not present

## 2017-03-27 DIAGNOSIS — N186 End stage renal disease: Secondary | ICD-10-CM | POA: Diagnosis not present

## 2017-03-27 DIAGNOSIS — N2581 Secondary hyperparathyroidism of renal origin: Secondary | ICD-10-CM | POA: Diagnosis not present

## 2017-03-27 DIAGNOSIS — Z23 Encounter for immunization: Secondary | ICD-10-CM | POA: Diagnosis not present

## 2017-03-27 NOTE — Telephone Encounter (Signed)
Pt called back. I gave them the number provided.

## 2017-03-27 NOTE — Telephone Encounter (Signed)
With the number provided below I cannot order them for the patient but I called the patient to inform her but she was in dialysis and will call back for the number.  [phone]

## 2017-03-28 ENCOUNTER — Ambulatory Visit (INDEPENDENT_AMBULATORY_CARE_PROVIDER_SITE_OTHER): Payer: Medicare HMO | Admitting: Nurse Practitioner

## 2017-03-28 ENCOUNTER — Encounter: Payer: Self-pay | Admitting: Nurse Practitioner

## 2017-03-28 VITALS — BP 142/60 | HR 71 | Temp 98.6°F | Ht 62.0 in | Wt 133.0 lb

## 2017-03-28 DIAGNOSIS — E118 Type 2 diabetes mellitus with unspecified complications: Secondary | ICD-10-CM | POA: Diagnosis not present

## 2017-03-28 DIAGNOSIS — R5381 Other malaise: Secondary | ICD-10-CM | POA: Diagnosis not present

## 2017-03-28 DIAGNOSIS — Z951 Presence of aortocoronary bypass graft: Secondary | ICD-10-CM

## 2017-03-28 DIAGNOSIS — Z7409 Other reduced mobility: Secondary | ICD-10-CM | POA: Diagnosis not present

## 2017-03-28 DIAGNOSIS — N185 Chronic kidney disease, stage 5: Secondary | ICD-10-CM | POA: Diagnosis not present

## 2017-03-28 DIAGNOSIS — Z794 Long term (current) use of insulin: Secondary | ICD-10-CM | POA: Diagnosis not present

## 2017-03-28 NOTE — Progress Notes (Signed)
Subjective:  Patient ID: Mary Valencia, female    DOB: [birth year]  Age: 61 y.o. MRN: [id number]  CC: Hospitalization Follow-up (hospital follow up--no complaint)  Accompanied by husband today.  HPI S/p CABGx 3: Discharged 03/21/2017 Started home PT ordered by CTS: Dr. Clementeen Graham (with [facility]). F/up appt 04/06/17. Normal oral intake and output. Use of stool softner. No constipation. No nausea no ABD pain, no fever, no SOB. No incision swelling or drainage. Pain control with oxycodone prescribed by Dr. Clementeen Graham. She is having difficulty with ADLs (bathing and transfer) due to generalized weakness and limited use of upper extremities since surgery  CKD with dialysis: Goes to [facility] Dialysis: Dr. Lafayette Dragon.  Dialysis on Tuesday, Thursday and Saturday via perm cath. Started 06/2016 States labs are done by dialysis center. Will have current labs faxed to [place].  DM: Home glucose: 100-200. 1 episode of hypoglycemia: 42 due to small food intake the night before.  HTN: Stable. Managed by Dr. Chelsea Aus.  Outpatient Medications Prior to Visit  Medication Sig Dispense Refill  . aspirin 81 MG tablet Take 81 mg by mouth daily.    Marland Kitchen CALCIUM-MAGNESIUM-VITAMIN D PO Take 1 tablet by mouth daily.    . diphenhydrAMINE (BENADRYL) 25 mg capsule Take 25 mg by mouth as needed.    . hydrALAZINE (APRESOLINE) 100 MG tablet Take 100 mg by mouth 3 (three) times daily.    . insulin NPH-regular Human (NOVOLIN 70/30) (70-30) 100 UNIT/ML injection Inject 10 Units into the skin 2 (two) times daily.    Marland Kitchen torsemide (DEMADEX) 20 MG tablet Take 40 mg by mouth 2 (two) times daily.     Marland Kitchen amLODipine (NORVASC) 10 MG tablet Take 10 mg by mouth daily.     . Cholecalciferol (VITAMIN D-3) 5000 UNITS TABS Take 1 tablet by mouth daily.    . isosorbide mononitrate (IMDUR) 30 MG 24 hr tablet Take 90 mg by mouth daily.    Marland Kitchen labetalol (NORMODYNE) 200 MG tablet Take 600 mg by mouth 3 (three) times daily.     Marland Kitchen  losartan (COZAAR) 100 MG tablet Take 100 mg by mouth daily.     No facility-administered medications prior to visit.     ROS See HPI  Objective:  BP (!) 142/60   Pulse 71   Temp 98.6 F (37 C)   Ht 5\' 2"  (1.575 m)   Wt 133 lb (60.3 kg)   SpO2 99%   BMI 24.33 kg/m   BP Readings from Last 3 Encounters:  03/28/17 (!) 142/60  12/08/16 140/62  11/27/16 (!) 162/60    Wt Readings from Last 3 Encounters:  03/28/17 133 lb (60.3 kg)  12/08/16 132 lb (59.9 kg)  11/27/16 140 lb (63.5 kg)    Physical Exam  Constitutional: She is oriented to person, place, and time. No distress.  Cardiovascular: Normal rate and regular rhythm.   Pulmonary/Chest: Effort normal.  Diminished lung sounds in bilateral bases.  Musculoskeletal: She exhibits edema.  Neurological: She is alert and oriented to person, place, and time.  Skin: Skin is warm and dry.  1 Midline thoracic,  2 abdominal surgical incisions (well approximated, dry, intact, no erythema).  Perm cath site (dressing is clean and dry).  Vitals reviewed.  Lab Results  Component Value Date   WBC 7.0 10/25/2012   HGB 12.2 10/25/2012   HCT 37.0 10/25/2012   PLT 288 10/25/2012   GLUCOSE 245 (H) 12/03/2013   CHOL 289 (H) 09/29/2010  TRIG 91.0 09/29/2010   HDL 62.80 09/29/2010   LDLDIRECT 207.8 09/29/2010   ALT 6 01/29/2012   AST 9 01/29/2012   NA 141 12/03/2013   K 5.3 (H) 12/03/2013   CL 105 12/03/2013   CREATININE 1.1 12/03/2013   BUN 27 (H) 12/03/2013   CO2 29 12/03/2013   TSH 1.743 01/29/2012   HGBA1C 14.1 (H) 12/03/2013    Mm Screening Breast Tomo Bilateral  Result Date: 10/13/2016 CLINICAL DATA:  Screening. EXAM: 2D DIGITAL SCREENING BILATERAL MAMMOGRAM WITH CAD AND ADJUNCT TOMO COMPARISON:  Previous exam(s). ACR Breast Density Category c: The breast tissue is heterogeneously dense, which may obscure small masses. FINDINGS: There are no findings suspicious for malignancy. Images were processed with CAD. IMPRESSION: No  mammographic evidence of malignancy. A result letter of this screening mammogram will be mailed directly to the patient. RECOMMENDATION: Screening mammogram in one year. (Code:[id number]) BI-RADS CATEGORY  1: Negative. Electronically Signed   By: Abelardo Diesel M.D.   On: 10/13/2016 09:13    Assessment & Plan:   Mary Valencia was seen today for hospitalization follow-up.  Diagnoses and all orders for this visit:  Type 2 diabetes mellitus with complication, with long-term current use of insulin ([place])  S/P CABG x 3 -     Ambulatory referral to Home Health  Chronic renal failure, stage 5 ([place])  Physical deconditioning -     Ambulatory referral to [place] mobility -     Ambulatory referral to Home Health   Due to thoracic surgical incision, she has limited ROM of upper extremities and generalized weakness. Therefore she will benefit from temporarily assistance with ADLs (bathing and transfer) while recovering from surgery.   I am having Ms. Gajda maintain her aspirin, Vitamin D-3, labetalol, hydrALAZINE, CALCIUM-MAGNESIUM-VITAMIN D PO, insulin NPH-regular Human, amLODipine, diphenhydrAMINE, losartan, isosorbide mononitrate, torsemide, labetalol, losartan, omeprazole, oxyCODONE, rosuvastatin, and senna-docusate.  Meds ordered this encounter  Medications  . labetalol (NORMODYNE) 100 MG tablet    Sig: Take 100 mg by mouth 3 (three) times daily.  Marland Kitchen losartan (COZAAR) 50 MG tablet    Sig: Take 50 mg by mouth daily.  Marland Kitchen omeprazole (PRILOSEC) 40 MG capsule    Sig: Take 40 mg by mouth daily.  Marland Kitchen oxyCODONE (OXY IR/ROXICODONE) 5 MG immediate release tablet    Sig: Take 5 mg by mouth daily as needed.  . rosuvastatin (CRESTOR) 20 MG tablet    Sig: Take 20 mg by mouth daily.  Marland Kitchen senna-docusate (SENOKOT-S) 8.6-50 MG tablet    Sig: Take by mouth 2 (two) times daily. Take 2 tab twice daily.    Follow-up: Return in about 4 weeks (around 04/25/2017) for with pcp.Wilfred Lacy, NP

## 2017-03-28 NOTE — Patient Instructions (Addendum)
Please have labs from dialysis center faxed to [place] ASAP.  Due to thoracic surgical incision, she has limited ROM of upper extremities and generalized weakness. Therefore she will benefit from temporarily assistance with ADLs (bathing and transfer) while recovering from surgery.

## 2017-03-29 DIAGNOSIS — E1122 Type 2 diabetes mellitus with diabetic chronic kidney disease: Secondary | ICD-10-CM | POA: Diagnosis not present

## 2017-03-29 DIAGNOSIS — N186 End stage renal disease: Secondary | ICD-10-CM | POA: Diagnosis not present

## 2017-03-29 DIAGNOSIS — I10 Essential (primary) hypertension: Secondary | ICD-10-CM | POA: Diagnosis not present

## 2017-03-29 DIAGNOSIS — Z23 Encounter for immunization: Secondary | ICD-10-CM | POA: Diagnosis not present

## 2017-03-29 DIAGNOSIS — N2581 Secondary hyperparathyroidism of renal origin: Secondary | ICD-10-CM | POA: Diagnosis not present

## 2017-03-29 DIAGNOSIS — D631 Anemia in chronic kidney disease: Secondary | ICD-10-CM | POA: Diagnosis not present

## 2017-03-29 DIAGNOSIS — E1165 Type 2 diabetes mellitus with hyperglycemia: Secondary | ICD-10-CM | POA: Diagnosis not present

## 2017-03-29 LAB — LIPID PANEL
CHOLESTEROL: 161 (ref 0–200)
HDL: 65 (ref 35–70)
LDL CALC: 74
TRIGLYCERIDES: 39 — AB (ref 40–160)

## 2017-03-29 LAB — HEMOGLOBIN A1C: Hemoglobin A1C: 8

## 2017-03-29 LAB — BASIC METABOLIC PANEL
BUN: 43 — AB (ref 4–21)
Creatinine: 6.9 — AB (ref <=1.1)
Glucose: 210
Potassium: 4.9 (ref 3.4–5.3)
SODIUM: 139 (ref 137–147)
Sodium: 139 (ref 137–147)

## 2017-03-29 LAB — CBC AND DIFFERENTIAL
HEMATOCRIT: 23 — AB (ref 36–46)
HEMOGLOBIN: 7.4 — AB (ref 12.0–16.0)
PLATELETS: 318 (ref 150–399)
WBC: 9.3

## 2017-03-29 LAB — HEPATIC FUNCTION PANEL
ALK PHOS: 134 — AB (ref 25–125)
ALT: 10 (ref 7–35)
AST: 18 (ref 13–35)

## 2017-03-29 LAB — VITAMIN B12: VITAMIN B 12: 948

## 2017-03-30 DIAGNOSIS — N186 End stage renal disease: Secondary | ICD-10-CM | POA: Diagnosis not present

## 2017-03-30 DIAGNOSIS — Z951 Presence of aortocoronary bypass graft: Secondary | ICD-10-CM | POA: Diagnosis not present

## 2017-03-30 DIAGNOSIS — Z992 Dependence on renal dialysis: Secondary | ICD-10-CM | POA: Diagnosis not present

## 2017-03-30 DIAGNOSIS — I5030 Unspecified diastolic (congestive) heart failure: Secondary | ICD-10-CM | POA: Diagnosis not present

## 2017-03-30 DIAGNOSIS — Z48812 Encounter for surgical aftercare following surgery on the circulatory system: Secondary | ICD-10-CM | POA: Diagnosis not present

## 2017-03-30 DIAGNOSIS — D631 Anemia in chronic kidney disease: Secondary | ICD-10-CM | POA: Diagnosis not present

## 2017-03-30 DIAGNOSIS — E1122 Type 2 diabetes mellitus with diabetic chronic kidney disease: Secondary | ICD-10-CM | POA: Diagnosis not present

## 2017-03-30 DIAGNOSIS — I25119 Atherosclerotic heart disease of native coronary artery with unspecified angina pectoris: Secondary | ICD-10-CM | POA: Diagnosis not present

## 2017-03-30 DIAGNOSIS — I132 Hypertensive heart and chronic kidney disease with heart failure and with stage 5 chronic kidney disease, or end stage renal disease: Secondary | ICD-10-CM | POA: Diagnosis not present

## 2017-03-31 DIAGNOSIS — D631 Anemia in chronic kidney disease: Secondary | ICD-10-CM | POA: Diagnosis not present

## 2017-03-31 DIAGNOSIS — Z23 Encounter for immunization: Secondary | ICD-10-CM | POA: Diagnosis not present

## 2017-03-31 DIAGNOSIS — N2581 Secondary hyperparathyroidism of renal origin: Secondary | ICD-10-CM | POA: Diagnosis not present

## 2017-03-31 DIAGNOSIS — N186 End stage renal disease: Secondary | ICD-10-CM | POA: Diagnosis not present

## 2017-04-02 DIAGNOSIS — E1122 Type 2 diabetes mellitus with diabetic chronic kidney disease: Secondary | ICD-10-CM | POA: Diagnosis not present

## 2017-04-02 DIAGNOSIS — D631 Anemia in chronic kidney disease: Secondary | ICD-10-CM | POA: Diagnosis not present

## 2017-04-02 DIAGNOSIS — I5030 Unspecified diastolic (congestive) heart failure: Secondary | ICD-10-CM | POA: Diagnosis not present

## 2017-04-02 DIAGNOSIS — I132 Hypertensive heart and chronic kidney disease with heart failure and with stage 5 chronic kidney disease, or end stage renal disease: Secondary | ICD-10-CM | POA: Diagnosis not present

## 2017-04-02 DIAGNOSIS — Z48812 Encounter for surgical aftercare following surgery on the circulatory system: Secondary | ICD-10-CM | POA: Diagnosis not present

## 2017-04-02 DIAGNOSIS — I25119 Atherosclerotic heart disease of native coronary artery with unspecified angina pectoris: Secondary | ICD-10-CM | POA: Diagnosis not present

## 2017-04-02 DIAGNOSIS — Z992 Dependence on renal dialysis: Secondary | ICD-10-CM | POA: Diagnosis not present

## 2017-04-02 DIAGNOSIS — Z951 Presence of aortocoronary bypass graft: Secondary | ICD-10-CM | POA: Diagnosis not present

## 2017-04-02 DIAGNOSIS — N186 End stage renal disease: Secondary | ICD-10-CM | POA: Diagnosis not present

## 2017-04-03 DIAGNOSIS — N186 End stage renal disease: Secondary | ICD-10-CM | POA: Diagnosis not present

## 2017-04-03 DIAGNOSIS — Z23 Encounter for immunization: Secondary | ICD-10-CM | POA: Diagnosis not present

## 2017-04-03 DIAGNOSIS — N2581 Secondary hyperparathyroidism of renal origin: Secondary | ICD-10-CM | POA: Diagnosis not present

## 2017-04-03 DIAGNOSIS — D631 Anemia in chronic kidney disease: Secondary | ICD-10-CM | POA: Diagnosis not present

## 2017-04-04 ENCOUNTER — Encounter: Payer: Self-pay | Admitting: Internal Medicine

## 2017-04-04 DIAGNOSIS — D631 Anemia in chronic kidney disease: Secondary | ICD-10-CM | POA: Diagnosis not present

## 2017-04-04 DIAGNOSIS — E1122 Type 2 diabetes mellitus with diabetic chronic kidney disease: Secondary | ICD-10-CM | POA: Diagnosis not present

## 2017-04-04 DIAGNOSIS — I5030 Unspecified diastolic (congestive) heart failure: Secondary | ICD-10-CM | POA: Diagnosis not present

## 2017-04-04 DIAGNOSIS — Z992 Dependence on renal dialysis: Secondary | ICD-10-CM | POA: Diagnosis not present

## 2017-04-04 DIAGNOSIS — Z48812 Encounter for surgical aftercare following surgery on the circulatory system: Secondary | ICD-10-CM | POA: Diagnosis not present

## 2017-04-04 DIAGNOSIS — N186 End stage renal disease: Secondary | ICD-10-CM | POA: Diagnosis not present

## 2017-04-04 DIAGNOSIS — Z951 Presence of aortocoronary bypass graft: Secondary | ICD-10-CM | POA: Diagnosis not present

## 2017-04-04 DIAGNOSIS — I25119 Atherosclerotic heart disease of native coronary artery with unspecified angina pectoris: Secondary | ICD-10-CM | POA: Diagnosis not present

## 2017-04-04 DIAGNOSIS — I132 Hypertensive heart and chronic kidney disease with heart failure and with stage 5 chronic kidney disease, or end stage renal disease: Secondary | ICD-10-CM | POA: Diagnosis not present

## 2017-04-05 DIAGNOSIS — N186 End stage renal disease: Secondary | ICD-10-CM | POA: Diagnosis not present

## 2017-04-05 DIAGNOSIS — D509 Iron deficiency anemia, unspecified: Secondary | ICD-10-CM | POA: Diagnosis not present

## 2017-04-05 DIAGNOSIS — D631 Anemia in chronic kidney disease: Secondary | ICD-10-CM | POA: Diagnosis not present

## 2017-04-06 DIAGNOSIS — I132 Hypertensive heart and chronic kidney disease with heart failure and with stage 5 chronic kidney disease, or end stage renal disease: Secondary | ICD-10-CM | POA: Diagnosis not present

## 2017-04-06 DIAGNOSIS — Z48812 Encounter for surgical aftercare following surgery on the circulatory system: Secondary | ICD-10-CM | POA: Diagnosis not present

## 2017-04-06 DIAGNOSIS — Z992 Dependence on renal dialysis: Secondary | ICD-10-CM | POA: Diagnosis not present

## 2017-04-06 DIAGNOSIS — N186 End stage renal disease: Secondary | ICD-10-CM | POA: Diagnosis not present

## 2017-04-06 DIAGNOSIS — D631 Anemia in chronic kidney disease: Secondary | ICD-10-CM | POA: Diagnosis not present

## 2017-04-06 DIAGNOSIS — I25119 Atherosclerotic heart disease of native coronary artery with unspecified angina pectoris: Secondary | ICD-10-CM | POA: Diagnosis not present

## 2017-04-06 DIAGNOSIS — E1122 Type 2 diabetes mellitus with diabetic chronic kidney disease: Secondary | ICD-10-CM | POA: Diagnosis not present

## 2017-04-06 DIAGNOSIS — Z951 Presence of aortocoronary bypass graft: Secondary | ICD-10-CM | POA: Diagnosis not present

## 2017-04-06 DIAGNOSIS — I5030 Unspecified diastolic (congestive) heart failure: Secondary | ICD-10-CM | POA: Diagnosis not present

## 2017-04-07 DIAGNOSIS — N186 End stage renal disease: Secondary | ICD-10-CM | POA: Diagnosis not present

## 2017-04-07 DIAGNOSIS — D631 Anemia in chronic kidney disease: Secondary | ICD-10-CM | POA: Diagnosis not present

## 2017-04-07 DIAGNOSIS — D509 Iron deficiency anemia, unspecified: Secondary | ICD-10-CM | POA: Diagnosis not present

## 2017-04-09 ENCOUNTER — Telehealth: Payer: Self-pay | Admitting: Internal Medicine

## 2017-04-09 DIAGNOSIS — I5030 Unspecified diastolic (congestive) heart failure: Secondary | ICD-10-CM | POA: Diagnosis not present

## 2017-04-09 DIAGNOSIS — D631 Anemia in chronic kidney disease: Secondary | ICD-10-CM | POA: Diagnosis not present

## 2017-04-09 DIAGNOSIS — Z951 Presence of aortocoronary bypass graft: Secondary | ICD-10-CM | POA: Diagnosis not present

## 2017-04-09 DIAGNOSIS — Z48812 Encounter for surgical aftercare following surgery on the circulatory system: Secondary | ICD-10-CM | POA: Diagnosis not present

## 2017-04-09 DIAGNOSIS — Z992 Dependence on renal dialysis: Secondary | ICD-10-CM | POA: Diagnosis not present

## 2017-04-09 DIAGNOSIS — I25119 Atherosclerotic heart disease of native coronary artery with unspecified angina pectoris: Secondary | ICD-10-CM | POA: Diagnosis not present

## 2017-04-09 DIAGNOSIS — N186 End stage renal disease: Secondary | ICD-10-CM | POA: Diagnosis not present

## 2017-04-09 DIAGNOSIS — I132 Hypertensive heart and chronic kidney disease with heart failure and with stage 5 chronic kidney disease, or end stage renal disease: Secondary | ICD-10-CM | POA: Diagnosis not present

## 2017-04-09 DIAGNOSIS — E1122 Type 2 diabetes mellitus with diabetic chronic kidney disease: Secondary | ICD-10-CM | POA: Diagnosis not present

## 2017-04-09 NOTE — Telephone Encounter (Signed)
Physical therapists called stating the patients BP this AM at 11 was 170/80, this was before the patients BP meds, He just took it at 1:20p,, and it was 168/54. This was after the patient had taken her meds. Patient is not dizzy, not ight headed, is having no symptoms,  If there are any specials instructions please call in the patient,  Please advise.

## 2017-04-10 DIAGNOSIS — N186 End stage renal disease: Secondary | ICD-10-CM | POA: Diagnosis not present

## 2017-04-10 DIAGNOSIS — D631 Anemia in chronic kidney disease: Secondary | ICD-10-CM | POA: Diagnosis not present

## 2017-04-10 DIAGNOSIS — D509 Iron deficiency anemia, unspecified: Secondary | ICD-10-CM | POA: Diagnosis not present

## 2017-04-12 DIAGNOSIS — D509 Iron deficiency anemia, unspecified: Secondary | ICD-10-CM | POA: Diagnosis not present

## 2017-04-12 DIAGNOSIS — N186 End stage renal disease: Secondary | ICD-10-CM | POA: Diagnosis not present

## 2017-04-12 DIAGNOSIS — D631 Anemia in chronic kidney disease: Secondary | ICD-10-CM | POA: Diagnosis not present

## 2017-04-12 NOTE — Telephone Encounter (Signed)
I tried to call ben/physical therapy--he did not answer---I have talked with patient, she is asymptomatic---she is dialysis patient and has bp checked several times weekly, she feels it was up  Because she had not taken bp meds yet---I did advise if bp continues to trend high, we can see her here in the office, she can call back to schedule

## 2017-04-13 ENCOUNTER — Telehealth: Payer: Self-pay | Admitting: Internal Medicine

## 2017-04-13 DIAGNOSIS — Z992 Dependence on renal dialysis: Secondary | ICD-10-CM | POA: Diagnosis not present

## 2017-04-13 DIAGNOSIS — E1122 Type 2 diabetes mellitus with diabetic chronic kidney disease: Secondary | ICD-10-CM | POA: Diagnosis not present

## 2017-04-13 DIAGNOSIS — I5030 Unspecified diastolic (congestive) heart failure: Secondary | ICD-10-CM | POA: Diagnosis not present

## 2017-04-13 DIAGNOSIS — N186 End stage renal disease: Secondary | ICD-10-CM | POA: Diagnosis not present

## 2017-04-13 DIAGNOSIS — I25119 Atherosclerotic heart disease of native coronary artery with unspecified angina pectoris: Secondary | ICD-10-CM | POA: Diagnosis not present

## 2017-04-13 DIAGNOSIS — I132 Hypertensive heart and chronic kidney disease with heart failure and with stage 5 chronic kidney disease, or end stage renal disease: Secondary | ICD-10-CM | POA: Diagnosis not present

## 2017-04-13 DIAGNOSIS — Z951 Presence of aortocoronary bypass graft: Secondary | ICD-10-CM | POA: Diagnosis not present

## 2017-04-13 DIAGNOSIS — Z48812 Encounter for surgical aftercare following surgery on the circulatory system: Secondary | ICD-10-CM | POA: Diagnosis not present

## 2017-04-13 DIAGNOSIS — D631 Anemia in chronic kidney disease: Secondary | ICD-10-CM | POA: Diagnosis not present

## 2017-04-13 NOTE — Telephone Encounter (Signed)
Please advise 

## 2017-04-13 NOTE — Telephone Encounter (Signed)
Her fasting BS today was 227 Took first dose of BP med 30 minutes ago and her BP was 200/80 No symptoms are showing  Vitals are normal  Please advise and call back  Also call patient

## 2017-04-14 DIAGNOSIS — D509 Iron deficiency anemia, unspecified: Secondary | ICD-10-CM | POA: Diagnosis not present

## 2017-04-14 DIAGNOSIS — N186 End stage renal disease: Secondary | ICD-10-CM | POA: Diagnosis not present

## 2017-04-14 DIAGNOSIS — D631 Anemia in chronic kidney disease: Secondary | ICD-10-CM | POA: Diagnosis not present

## 2017-04-16 DIAGNOSIS — I25119 Atherosclerotic heart disease of native coronary artery with unspecified angina pectoris: Secondary | ICD-10-CM | POA: Diagnosis not present

## 2017-04-16 DIAGNOSIS — N186 End stage renal disease: Secondary | ICD-10-CM | POA: Diagnosis not present

## 2017-04-16 DIAGNOSIS — D631 Anemia in chronic kidney disease: Secondary | ICD-10-CM | POA: Diagnosis not present

## 2017-04-16 DIAGNOSIS — I132 Hypertensive heart and chronic kidney disease with heart failure and with stage 5 chronic kidney disease, or end stage renal disease: Secondary | ICD-10-CM | POA: Diagnosis not present

## 2017-04-16 DIAGNOSIS — Z992 Dependence on renal dialysis: Secondary | ICD-10-CM | POA: Diagnosis not present

## 2017-04-16 DIAGNOSIS — E1122 Type 2 diabetes mellitus with diabetic chronic kidney disease: Secondary | ICD-10-CM | POA: Diagnosis not present

## 2017-04-16 DIAGNOSIS — Z48812 Encounter for surgical aftercare following surgery on the circulatory system: Secondary | ICD-10-CM | POA: Diagnosis not present

## 2017-04-16 DIAGNOSIS — Z951 Presence of aortocoronary bypass graft: Secondary | ICD-10-CM | POA: Diagnosis not present

## 2017-04-16 DIAGNOSIS — I5030 Unspecified diastolic (congestive) heart failure: Secondary | ICD-10-CM | POA: Diagnosis not present

## 2017-04-16 NOTE — Telephone Encounter (Signed)
Recheck BP 1-2hrs after BP medication.

## 2017-04-16 NOTE — Telephone Encounter (Signed)
LM notifying pt

## 2017-04-17 DIAGNOSIS — D509 Iron deficiency anemia, unspecified: Secondary | ICD-10-CM | POA: Diagnosis not present

## 2017-04-17 DIAGNOSIS — D631 Anemia in chronic kidney disease: Secondary | ICD-10-CM | POA: Diagnosis not present

## 2017-04-17 DIAGNOSIS — N186 End stage renal disease: Secondary | ICD-10-CM | POA: Diagnosis not present

## 2017-04-18 DIAGNOSIS — E1122 Type 2 diabetes mellitus with diabetic chronic kidney disease: Secondary | ICD-10-CM | POA: Diagnosis not present

## 2017-04-18 DIAGNOSIS — Z992 Dependence on renal dialysis: Secondary | ICD-10-CM | POA: Diagnosis not present

## 2017-04-18 DIAGNOSIS — Z951 Presence of aortocoronary bypass graft: Secondary | ICD-10-CM | POA: Diagnosis not present

## 2017-04-18 DIAGNOSIS — I120 Hypertensive chronic kidney disease with stage 5 chronic kidney disease or end stage renal disease: Secondary | ICD-10-CM | POA: Diagnosis not present

## 2017-04-18 DIAGNOSIS — Z48812 Encounter for surgical aftercare following surgery on the circulatory system: Secondary | ICD-10-CM | POA: Diagnosis not present

## 2017-04-18 DIAGNOSIS — Z79899 Other long term (current) drug therapy: Secondary | ICD-10-CM | POA: Diagnosis not present

## 2017-04-18 DIAGNOSIS — N186 End stage renal disease: Secondary | ICD-10-CM | POA: Diagnosis not present

## 2017-04-18 NOTE — Telephone Encounter (Signed)
Pt has been made aware to check her BP 1-2 hours after taking med

## 2017-04-19 DIAGNOSIS — D631 Anemia in chronic kidney disease: Secondary | ICD-10-CM | POA: Diagnosis not present

## 2017-04-19 DIAGNOSIS — N186 End stage renal disease: Secondary | ICD-10-CM | POA: Diagnosis not present

## 2017-04-19 DIAGNOSIS — D509 Iron deficiency anemia, unspecified: Secondary | ICD-10-CM | POA: Diagnosis not present

## 2017-04-21 DIAGNOSIS — D509 Iron deficiency anemia, unspecified: Secondary | ICD-10-CM | POA: Diagnosis not present

## 2017-04-21 DIAGNOSIS — D631 Anemia in chronic kidney disease: Secondary | ICD-10-CM | POA: Diagnosis not present

## 2017-04-21 DIAGNOSIS — N186 End stage renal disease: Secondary | ICD-10-CM | POA: Diagnosis not present

## 2017-04-23 DIAGNOSIS — Z951 Presence of aortocoronary bypass graft: Secondary | ICD-10-CM | POA: Diagnosis not present

## 2017-04-23 DIAGNOSIS — D631 Anemia in chronic kidney disease: Secondary | ICD-10-CM | POA: Diagnosis not present

## 2017-04-23 DIAGNOSIS — Z992 Dependence on renal dialysis: Secondary | ICD-10-CM | POA: Diagnosis not present

## 2017-04-23 DIAGNOSIS — E1122 Type 2 diabetes mellitus with diabetic chronic kidney disease: Secondary | ICD-10-CM | POA: Diagnosis not present

## 2017-04-23 DIAGNOSIS — N186 End stage renal disease: Secondary | ICD-10-CM | POA: Diagnosis not present

## 2017-04-23 DIAGNOSIS — I25119 Atherosclerotic heart disease of native coronary artery with unspecified angina pectoris: Secondary | ICD-10-CM | POA: Diagnosis not present

## 2017-04-23 DIAGNOSIS — I132 Hypertensive heart and chronic kidney disease with heart failure and with stage 5 chronic kidney disease, or end stage renal disease: Secondary | ICD-10-CM | POA: Diagnosis not present

## 2017-04-23 DIAGNOSIS — I5030 Unspecified diastolic (congestive) heart failure: Secondary | ICD-10-CM | POA: Diagnosis not present

## 2017-04-23 DIAGNOSIS — Z48812 Encounter for surgical aftercare following surgery on the circulatory system: Secondary | ICD-10-CM | POA: Diagnosis not present

## 2017-04-24 ENCOUNTER — Other Ambulatory Visit: Payer: Self-pay

## 2017-04-24 DIAGNOSIS — D509 Iron deficiency anemia, unspecified: Secondary | ICD-10-CM | POA: Diagnosis not present

## 2017-04-24 DIAGNOSIS — D631 Anemia in chronic kidney disease: Secondary | ICD-10-CM | POA: Diagnosis not present

## 2017-04-24 DIAGNOSIS — N186 End stage renal disease: Secondary | ICD-10-CM | POA: Diagnosis not present

## 2017-04-24 MED ORDER — BD SWAB SINGLE USE REGULAR PADS: MEDICATED_PAD | 3 refills | Status: DC

## 2017-04-24 MED ORDER — ACCU-CHEK SMARTVIEW CONTROL VI LIQD: 1 refills | Status: DC

## 2017-04-25 DIAGNOSIS — N186 End stage renal disease: Secondary | ICD-10-CM | POA: Diagnosis not present

## 2017-04-25 DIAGNOSIS — Z992 Dependence on renal dialysis: Secondary | ICD-10-CM | POA: Diagnosis not present

## 2017-04-26 ENCOUNTER — Other Ambulatory Visit: Payer: Self-pay

## 2017-04-26 DIAGNOSIS — Z951 Presence of aortocoronary bypass graft: Secondary | ICD-10-CM | POA: Diagnosis not present

## 2017-04-26 DIAGNOSIS — Z48812 Encounter for surgical aftercare following surgery on the circulatory system: Secondary | ICD-10-CM | POA: Diagnosis not present

## 2017-04-26 DIAGNOSIS — I5030 Unspecified diastolic (congestive) heart failure: Secondary | ICD-10-CM | POA: Diagnosis not present

## 2017-04-26 DIAGNOSIS — Z992 Dependence on renal dialysis: Secondary | ICD-10-CM | POA: Diagnosis not present

## 2017-04-26 DIAGNOSIS — N186 End stage renal disease: Secondary | ICD-10-CM | POA: Diagnosis not present

## 2017-04-26 DIAGNOSIS — I132 Hypertensive heart and chronic kidney disease with heart failure and with stage 5 chronic kidney disease, or end stage renal disease: Secondary | ICD-10-CM | POA: Diagnosis not present

## 2017-04-26 DIAGNOSIS — D631 Anemia in chronic kidney disease: Secondary | ICD-10-CM | POA: Diagnosis not present

## 2017-04-26 DIAGNOSIS — I25119 Atherosclerotic heart disease of native coronary artery with unspecified angina pectoris: Secondary | ICD-10-CM | POA: Diagnosis not present

## 2017-04-26 DIAGNOSIS — N2581 Secondary hyperparathyroidism of renal origin: Secondary | ICD-10-CM | POA: Diagnosis not present

## 2017-04-26 DIAGNOSIS — E1122 Type 2 diabetes mellitus with diabetic chronic kidney disease: Secondary | ICD-10-CM | POA: Diagnosis not present

## 2017-04-26 MED ORDER — ACCU-CHEK FASTCLIX LANCETS MISC: 3 refills | Status: DC

## 2017-04-26 MED ORDER — ACCU-CHEK NANO SMARTVIEW W/DEVICE KIT: PACK | 0 refills | Status: DC

## 2017-04-26 MED ORDER — GLUCOSE BLOOD VI STRP: ORAL_STRIP | 3 refills | Status: DC

## 2017-04-28 DIAGNOSIS — D631 Anemia in chronic kidney disease: Secondary | ICD-10-CM | POA: Diagnosis not present

## 2017-04-28 DIAGNOSIS — N186 End stage renal disease: Secondary | ICD-10-CM | POA: Diagnosis not present

## 2017-04-28 DIAGNOSIS — N2581 Secondary hyperparathyroidism of renal origin: Secondary | ICD-10-CM | POA: Diagnosis not present

## 2017-05-01 DIAGNOSIS — D631 Anemia in chronic kidney disease: Secondary | ICD-10-CM | POA: Diagnosis not present

## 2017-05-01 DIAGNOSIS — N186 End stage renal disease: Secondary | ICD-10-CM | POA: Diagnosis not present

## 2017-05-01 DIAGNOSIS — N2581 Secondary hyperparathyroidism of renal origin: Secondary | ICD-10-CM | POA: Diagnosis not present

## 2017-05-03 DIAGNOSIS — N186 End stage renal disease: Secondary | ICD-10-CM | POA: Diagnosis not present

## 2017-05-03 DIAGNOSIS — N2581 Secondary hyperparathyroidism of renal origin: Secondary | ICD-10-CM | POA: Diagnosis not present

## 2017-05-03 DIAGNOSIS — E1165 Type 2 diabetes mellitus with hyperglycemia: Secondary | ICD-10-CM | POA: Diagnosis not present

## 2017-05-03 DIAGNOSIS — D631 Anemia in chronic kidney disease: Secondary | ICD-10-CM | POA: Diagnosis not present

## 2017-05-03 LAB — BASIC METABOLIC PANEL
BUN: 70 — AB (ref 4–21)
CREATININE: 7.3 — AB (ref 0.5–1.1)
GLUCOSE: 97
POTASSIUM: 4.3 (ref 3.4–5.3)
Sodium: 139 (ref 137–147)

## 2017-05-03 LAB — HEPATIC FUNCTION PANEL
ALT: 11 (ref 7–35)
AST: 19 (ref 13–35)
Alkaline Phosphatase: 179 — AB (ref 25–125)

## 2017-05-05 DIAGNOSIS — N2581 Secondary hyperparathyroidism of renal origin: Secondary | ICD-10-CM | POA: Diagnosis not present

## 2017-05-05 DIAGNOSIS — D631 Anemia in chronic kidney disease: Secondary | ICD-10-CM | POA: Diagnosis not present

## 2017-05-05 DIAGNOSIS — N186 End stage renal disease: Secondary | ICD-10-CM | POA: Diagnosis not present

## 2017-05-07 DIAGNOSIS — N186 End stage renal disease: Secondary | ICD-10-CM | POA: Diagnosis not present

## 2017-05-07 DIAGNOSIS — D631 Anemia in chronic kidney disease: Secondary | ICD-10-CM | POA: Diagnosis not present

## 2017-05-07 DIAGNOSIS — D509 Iron deficiency anemia, unspecified: Secondary | ICD-10-CM | POA: Diagnosis not present

## 2017-05-08 DIAGNOSIS — Z992 Dependence on renal dialysis: Secondary | ICD-10-CM | POA: Diagnosis not present

## 2017-05-08 DIAGNOSIS — E785 Hyperlipidemia, unspecified: Secondary | ICD-10-CM | POA: Diagnosis not present

## 2017-05-08 DIAGNOSIS — Z7982 Long term (current) use of aspirin: Secondary | ICD-10-CM | POA: Diagnosis not present

## 2017-05-08 DIAGNOSIS — I10 Essential (primary) hypertension: Secondary | ICD-10-CM | POA: Diagnosis not present

## 2017-05-08 DIAGNOSIS — Z951 Presence of aortocoronary bypass graft: Secondary | ICD-10-CM | POA: Diagnosis not present

## 2017-05-08 DIAGNOSIS — N186 End stage renal disease: Secondary | ICD-10-CM | POA: Diagnosis not present

## 2017-05-08 DIAGNOSIS — I120 Hypertensive chronic kidney disease with stage 5 chronic kidney disease or end stage renal disease: Secondary | ICD-10-CM | POA: Diagnosis not present

## 2017-05-08 DIAGNOSIS — E1122 Type 2 diabetes mellitus with diabetic chronic kidney disease: Secondary | ICD-10-CM | POA: Diagnosis not present

## 2017-05-08 DIAGNOSIS — Z794 Long term (current) use of insulin: Secondary | ICD-10-CM | POA: Diagnosis not present

## 2017-05-08 DIAGNOSIS — Z8673 Personal history of transient ischemic attack (TIA), and cerebral infarction without residual deficits: Secondary | ICD-10-CM | POA: Diagnosis not present

## 2017-05-08 DIAGNOSIS — I2510 Atherosclerotic heart disease of native coronary artery without angina pectoris: Secondary | ICD-10-CM | POA: Diagnosis not present

## 2017-05-09 DIAGNOSIS — I5030 Unspecified diastolic (congestive) heart failure: Secondary | ICD-10-CM | POA: Diagnosis not present

## 2017-05-09 DIAGNOSIS — D631 Anemia in chronic kidney disease: Secondary | ICD-10-CM | POA: Diagnosis not present

## 2017-05-09 DIAGNOSIS — Z48812 Encounter for surgical aftercare following surgery on the circulatory system: Secondary | ICD-10-CM | POA: Diagnosis not present

## 2017-05-09 DIAGNOSIS — I132 Hypertensive heart and chronic kidney disease with heart failure and with stage 5 chronic kidney disease, or end stage renal disease: Secondary | ICD-10-CM | POA: Diagnosis not present

## 2017-05-09 DIAGNOSIS — I25119 Atherosclerotic heart disease of native coronary artery with unspecified angina pectoris: Secondary | ICD-10-CM | POA: Diagnosis not present

## 2017-05-09 DIAGNOSIS — Z992 Dependence on renal dialysis: Secondary | ICD-10-CM | POA: Diagnosis not present

## 2017-05-09 DIAGNOSIS — Z951 Presence of aortocoronary bypass graft: Secondary | ICD-10-CM | POA: Diagnosis not present

## 2017-05-09 DIAGNOSIS — E1122 Type 2 diabetes mellitus with diabetic chronic kidney disease: Secondary | ICD-10-CM | POA: Diagnosis not present

## 2017-05-09 DIAGNOSIS — N186 End stage renal disease: Secondary | ICD-10-CM | POA: Diagnosis not present

## 2017-05-10 DIAGNOSIS — D509 Iron deficiency anemia, unspecified: Secondary | ICD-10-CM | POA: Diagnosis not present

## 2017-05-10 DIAGNOSIS — D631 Anemia in chronic kidney disease: Secondary | ICD-10-CM | POA: Diagnosis not present

## 2017-05-10 DIAGNOSIS — N186 End stage renal disease: Secondary | ICD-10-CM | POA: Diagnosis not present

## 2017-05-12 DIAGNOSIS — D509 Iron deficiency anemia, unspecified: Secondary | ICD-10-CM | POA: Diagnosis not present

## 2017-05-12 DIAGNOSIS — D631 Anemia in chronic kidney disease: Secondary | ICD-10-CM | POA: Diagnosis not present

## 2017-05-12 DIAGNOSIS — N186 End stage renal disease: Secondary | ICD-10-CM | POA: Diagnosis not present

## 2017-05-14 DIAGNOSIS — N186 End stage renal disease: Secondary | ICD-10-CM | POA: Diagnosis not present

## 2017-05-14 DIAGNOSIS — D631 Anemia in chronic kidney disease: Secondary | ICD-10-CM | POA: Diagnosis not present

## 2017-05-14 DIAGNOSIS — D509 Iron deficiency anemia, unspecified: Secondary | ICD-10-CM | POA: Diagnosis not present

## 2017-05-14 DIAGNOSIS — N184 Chronic kidney disease, stage 4 (severe): Secondary | ICD-10-CM | POA: Diagnosis not present

## 2017-05-14 DIAGNOSIS — I2510 Atherosclerotic heart disease of native coronary artery without angina pectoris: Secondary | ICD-10-CM | POA: Diagnosis not present

## 2017-05-14 DIAGNOSIS — Z992 Dependence on renal dialysis: Secondary | ICD-10-CM | POA: Diagnosis not present

## 2017-05-14 DIAGNOSIS — Z794 Long term (current) use of insulin: Secondary | ICD-10-CM | POA: Diagnosis not present

## 2017-05-14 DIAGNOSIS — T82510S Breakdown (mechanical) of surgically created arteriovenous fistula, sequela: Secondary | ICD-10-CM | POA: Diagnosis not present

## 2017-05-14 DIAGNOSIS — E119 Type 2 diabetes mellitus without complications: Secondary | ICD-10-CM | POA: Diagnosis not present

## 2017-05-16 DIAGNOSIS — D509 Iron deficiency anemia, unspecified: Secondary | ICD-10-CM | POA: Diagnosis not present

## 2017-05-16 DIAGNOSIS — N186 End stage renal disease: Secondary | ICD-10-CM | POA: Diagnosis not present

## 2017-05-19 DIAGNOSIS — N186 End stage renal disease: Secondary | ICD-10-CM | POA: Diagnosis not present

## 2017-05-19 DIAGNOSIS — D509 Iron deficiency anemia, unspecified: Secondary | ICD-10-CM | POA: Diagnosis not present

## 2017-05-21 DIAGNOSIS — Z8711 Personal history of peptic ulcer disease: Secondary | ICD-10-CM | POA: Insufficient documentation

## 2017-05-21 DIAGNOSIS — I5030 Unspecified diastolic (congestive) heart failure: Secondary | ICD-10-CM | POA: Diagnosis not present

## 2017-05-21 DIAGNOSIS — M1990 Unspecified osteoarthritis, unspecified site: Secondary | ICD-10-CM | POA: Insufficient documentation

## 2017-05-21 DIAGNOSIS — G5793 Unspecified mononeuropathy of bilateral lower limbs: Secondary | ICD-10-CM | POA: Insufficient documentation

## 2017-05-21 DIAGNOSIS — D509 Iron deficiency anemia, unspecified: Secondary | ICD-10-CM | POA: Diagnosis not present

## 2017-05-21 DIAGNOSIS — Z992 Dependence on renal dialysis: Secondary | ICD-10-CM | POA: Diagnosis not present

## 2017-05-21 DIAGNOSIS — I132 Hypertensive heart and chronic kidney disease with heart failure and with stage 5 chronic kidney disease, or end stage renal disease: Secondary | ICD-10-CM | POA: Diagnosis not present

## 2017-05-21 DIAGNOSIS — D631 Anemia in chronic kidney disease: Secondary | ICD-10-CM | POA: Diagnosis not present

## 2017-05-21 DIAGNOSIS — Z951 Presence of aortocoronary bypass graft: Secondary | ICD-10-CM | POA: Diagnosis not present

## 2017-05-21 DIAGNOSIS — Z905 Acquired absence of kidney: Secondary | ICD-10-CM | POA: Insufficient documentation

## 2017-05-21 DIAGNOSIS — Z48812 Encounter for surgical aftercare following surgery on the circulatory system: Secondary | ICD-10-CM | POA: Diagnosis not present

## 2017-05-21 DIAGNOSIS — E1122 Type 2 diabetes mellitus with diabetic chronic kidney disease: Secondary | ICD-10-CM | POA: Diagnosis not present

## 2017-05-21 DIAGNOSIS — N186 End stage renal disease: Secondary | ICD-10-CM | POA: Diagnosis not present

## 2017-05-21 DIAGNOSIS — F418 Other specified anxiety disorders: Secondary | ICD-10-CM | POA: Insufficient documentation

## 2017-05-21 DIAGNOSIS — I25119 Atherosclerotic heart disease of native coronary artery with unspecified angina pectoris: Secondary | ICD-10-CM | POA: Diagnosis not present

## 2017-05-21 DIAGNOSIS — H538 Other visual disturbances: Secondary | ICD-10-CM | POA: Insufficient documentation

## 2017-05-21 DIAGNOSIS — N059 Unspecified nephritic syndrome with unspecified morphologic changes: Secondary | ICD-10-CM | POA: Insufficient documentation

## 2017-05-22 DIAGNOSIS — Z794 Long term (current) use of insulin: Secondary | ICD-10-CM | POA: Diagnosis not present

## 2017-05-22 DIAGNOSIS — E8770 Fluid overload, unspecified: Secondary | ICD-10-CM | POA: Diagnosis not present

## 2017-05-22 DIAGNOSIS — I120 Hypertensive chronic kidney disease with stage 5 chronic kidney disease or end stage renal disease: Secondary | ICD-10-CM | POA: Diagnosis not present

## 2017-05-22 DIAGNOSIS — N185 Chronic kidney disease, stage 5: Secondary | ICD-10-CM | POA: Diagnosis not present

## 2017-05-22 DIAGNOSIS — Z992 Dependence on renal dialysis: Secondary | ICD-10-CM | POA: Diagnosis not present

## 2017-05-22 DIAGNOSIS — N19 Unspecified kidney failure: Secondary | ICD-10-CM | POA: Diagnosis not present

## 2017-05-22 DIAGNOSIS — N61 Mastitis without abscess: Secondary | ICD-10-CM | POA: Diagnosis not present

## 2017-05-22 DIAGNOSIS — Z4902 Encounter for fitting and adjustment of peritoneal dialysis catheter: Secondary | ICD-10-CM | POA: Diagnosis not present

## 2017-05-22 DIAGNOSIS — E1122 Type 2 diabetes mellitus with diabetic chronic kidney disease: Secondary | ICD-10-CM | POA: Diagnosis not present

## 2017-05-22 DIAGNOSIS — N189 Chronic kidney disease, unspecified: Secondary | ICD-10-CM | POA: Diagnosis not present

## 2017-05-24 ENCOUNTER — Telehealth: Payer: Self-pay | Admitting: Internal Medicine

## 2017-05-24 DIAGNOSIS — D509 Iron deficiency anemia, unspecified: Secondary | ICD-10-CM | POA: Diagnosis not present

## 2017-05-24 DIAGNOSIS — N186 End stage renal disease: Secondary | ICD-10-CM | POA: Diagnosis not present

## 2017-05-24 NOTE — Telephone Encounter (Signed)
Copied from [place] [id number]. Topic: General - Other >> May 24, 2017  4:55 PM Vernona Rieger wrote: Wells Guiles called from [facility] Needs order to see her for diabetes, CHF, PD placement. They would like to do 1 time a week for 9 weeks. Call back @ [phone]

## 2017-05-25 DIAGNOSIS — Z992 Dependence on renal dialysis: Secondary | ICD-10-CM | POA: Diagnosis not present

## 2017-05-25 DIAGNOSIS — N186 End stage renal disease: Secondary | ICD-10-CM | POA: Diagnosis not present

## 2017-05-25 NOTE — Telephone Encounter (Signed)
LM giving verbals  FYI

## 2017-05-26 DIAGNOSIS — T8249XA Other complication of vascular dialysis catheter, initial encounter: Secondary | ICD-10-CM | POA: Diagnosis not present

## 2017-05-26 DIAGNOSIS — N186 End stage renal disease: Secondary | ICD-10-CM | POA: Diagnosis not present

## 2017-05-26 DIAGNOSIS — N2581 Secondary hyperparathyroidism of renal origin: Secondary | ICD-10-CM | POA: Diagnosis not present

## 2017-05-26 DIAGNOSIS — D631 Anemia in chronic kidney disease: Secondary | ICD-10-CM | POA: Diagnosis not present

## 2017-05-26 DIAGNOSIS — D509 Iron deficiency anemia, unspecified: Secondary | ICD-10-CM | POA: Diagnosis not present

## 2017-05-28 DIAGNOSIS — Z951 Presence of aortocoronary bypass graft: Secondary | ICD-10-CM | POA: Diagnosis not present

## 2017-05-28 DIAGNOSIS — D631 Anemia in chronic kidney disease: Secondary | ICD-10-CM | POA: Diagnosis not present

## 2017-05-28 DIAGNOSIS — E1122 Type 2 diabetes mellitus with diabetic chronic kidney disease: Secondary | ICD-10-CM | POA: Diagnosis not present

## 2017-05-28 DIAGNOSIS — Z905 Acquired absence of kidney: Secondary | ICD-10-CM | POA: Diagnosis not present

## 2017-05-28 DIAGNOSIS — I132 Hypertensive heart and chronic kidney disease with heart failure and with stage 5 chronic kidney disease, or end stage renal disease: Secondary | ICD-10-CM | POA: Diagnosis not present

## 2017-05-28 DIAGNOSIS — Z992 Dependence on renal dialysis: Secondary | ICD-10-CM | POA: Diagnosis not present

## 2017-05-28 DIAGNOSIS — N186 End stage renal disease: Secondary | ICD-10-CM | POA: Diagnosis not present

## 2017-05-28 DIAGNOSIS — I25119 Atherosclerotic heart disease of native coronary artery with unspecified angina pectoris: Secondary | ICD-10-CM | POA: Diagnosis not present

## 2017-05-28 DIAGNOSIS — I5030 Unspecified diastolic (congestive) heart failure: Secondary | ICD-10-CM | POA: Diagnosis not present

## 2017-05-29 DIAGNOSIS — D509 Iron deficiency anemia, unspecified: Secondary | ICD-10-CM | POA: Diagnosis not present

## 2017-05-29 DIAGNOSIS — D631 Anemia in chronic kidney disease: Secondary | ICD-10-CM | POA: Diagnosis not present

## 2017-05-29 DIAGNOSIS — N2581 Secondary hyperparathyroidism of renal origin: Secondary | ICD-10-CM | POA: Diagnosis not present

## 2017-05-29 DIAGNOSIS — T8249XA Other complication of vascular dialysis catheter, initial encounter: Secondary | ICD-10-CM | POA: Diagnosis not present

## 2017-05-29 DIAGNOSIS — N186 End stage renal disease: Secondary | ICD-10-CM | POA: Diagnosis not present

## 2017-05-31 DIAGNOSIS — Z1159 Encounter for screening for other viral diseases: Secondary | ICD-10-CM | POA: Diagnosis not present

## 2017-05-31 DIAGNOSIS — D631 Anemia in chronic kidney disease: Secondary | ICD-10-CM | POA: Diagnosis not present

## 2017-05-31 DIAGNOSIS — T8249XA Other complication of vascular dialysis catheter, initial encounter: Secondary | ICD-10-CM | POA: Diagnosis not present

## 2017-05-31 DIAGNOSIS — N186 End stage renal disease: Secondary | ICD-10-CM | POA: Diagnosis not present

## 2017-05-31 DIAGNOSIS — D509 Iron deficiency anemia, unspecified: Secondary | ICD-10-CM | POA: Diagnosis not present

## 2017-05-31 DIAGNOSIS — E1165 Type 2 diabetes mellitus with hyperglycemia: Secondary | ICD-10-CM | POA: Diagnosis not present

## 2017-05-31 DIAGNOSIS — N2581 Secondary hyperparathyroidism of renal origin: Secondary | ICD-10-CM | POA: Diagnosis not present

## 2017-06-02 DIAGNOSIS — N186 End stage renal disease: Secondary | ICD-10-CM | POA: Diagnosis not present

## 2017-06-02 DIAGNOSIS — D509 Iron deficiency anemia, unspecified: Secondary | ICD-10-CM | POA: Diagnosis not present

## 2017-06-02 DIAGNOSIS — D631 Anemia in chronic kidney disease: Secondary | ICD-10-CM | POA: Diagnosis not present

## 2017-06-02 DIAGNOSIS — T8249XA Other complication of vascular dialysis catheter, initial encounter: Secondary | ICD-10-CM | POA: Diagnosis not present

## 2017-06-02 DIAGNOSIS — N2581 Secondary hyperparathyroidism of renal origin: Secondary | ICD-10-CM | POA: Diagnosis not present

## 2017-06-05 DIAGNOSIS — N186 End stage renal disease: Secondary | ICD-10-CM | POA: Diagnosis not present

## 2017-06-06 DIAGNOSIS — Z951 Presence of aortocoronary bypass graft: Secondary | ICD-10-CM | POA: Diagnosis not present

## 2017-06-06 DIAGNOSIS — N186 End stage renal disease: Secondary | ICD-10-CM | POA: Diagnosis not present

## 2017-06-06 DIAGNOSIS — D631 Anemia in chronic kidney disease: Secondary | ICD-10-CM | POA: Diagnosis not present

## 2017-06-06 DIAGNOSIS — Z992 Dependence on renal dialysis: Secondary | ICD-10-CM | POA: Diagnosis not present

## 2017-06-06 DIAGNOSIS — I5030 Unspecified diastolic (congestive) heart failure: Secondary | ICD-10-CM | POA: Diagnosis not present

## 2017-06-06 DIAGNOSIS — E1122 Type 2 diabetes mellitus with diabetic chronic kidney disease: Secondary | ICD-10-CM | POA: Diagnosis not present

## 2017-06-06 DIAGNOSIS — I25119 Atherosclerotic heart disease of native coronary artery with unspecified angina pectoris: Secondary | ICD-10-CM | POA: Diagnosis not present

## 2017-06-06 DIAGNOSIS — I132 Hypertensive heart and chronic kidney disease with heart failure and with stage 5 chronic kidney disease, or end stage renal disease: Secondary | ICD-10-CM | POA: Diagnosis not present

## 2017-06-06 DIAGNOSIS — Z905 Acquired absence of kidney: Secondary | ICD-10-CM | POA: Diagnosis not present

## 2017-06-07 DIAGNOSIS — N186 End stage renal disease: Secondary | ICD-10-CM | POA: Diagnosis not present

## 2017-06-09 DIAGNOSIS — N186 End stage renal disease: Secondary | ICD-10-CM | POA: Diagnosis not present

## 2017-06-11 DIAGNOSIS — Z905 Acquired absence of kidney: Secondary | ICD-10-CM | POA: Diagnosis not present

## 2017-06-11 DIAGNOSIS — N186 End stage renal disease: Secondary | ICD-10-CM | POA: Diagnosis not present

## 2017-06-11 DIAGNOSIS — I25119 Atherosclerotic heart disease of native coronary artery with unspecified angina pectoris: Secondary | ICD-10-CM | POA: Diagnosis not present

## 2017-06-11 DIAGNOSIS — I132 Hypertensive heart and chronic kidney disease with heart failure and with stage 5 chronic kidney disease, or end stage renal disease: Secondary | ICD-10-CM | POA: Diagnosis not present

## 2017-06-11 DIAGNOSIS — I5030 Unspecified diastolic (congestive) heart failure: Secondary | ICD-10-CM | POA: Diagnosis not present

## 2017-06-11 DIAGNOSIS — D631 Anemia in chronic kidney disease: Secondary | ICD-10-CM | POA: Diagnosis not present

## 2017-06-11 DIAGNOSIS — E1122 Type 2 diabetes mellitus with diabetic chronic kidney disease: Secondary | ICD-10-CM | POA: Diagnosis not present

## 2017-06-11 DIAGNOSIS — Z992 Dependence on renal dialysis: Secondary | ICD-10-CM | POA: Diagnosis not present

## 2017-06-11 DIAGNOSIS — Z951 Presence of aortocoronary bypass graft: Secondary | ICD-10-CM | POA: Diagnosis not present

## 2017-06-12 DIAGNOSIS — N186 End stage renal disease: Secondary | ICD-10-CM | POA: Diagnosis not present

## 2017-06-13 DIAGNOSIS — Z4932 Encounter for adequacy testing for peritoneal dialysis: Secondary | ICD-10-CM | POA: Diagnosis not present

## 2017-06-13 DIAGNOSIS — N186 End stage renal disease: Secondary | ICD-10-CM | POA: Diagnosis not present

## 2017-06-14 ENCOUNTER — Encounter: Payer: Self-pay | Admitting: Internal Medicine

## 2017-06-14 DIAGNOSIS — N186 End stage renal disease: Secondary | ICD-10-CM | POA: Diagnosis not present

## 2017-06-15 DIAGNOSIS — N186 End stage renal disease: Secondary | ICD-10-CM | POA: Diagnosis not present

## 2017-06-16 DIAGNOSIS — N186 End stage renal disease: Secondary | ICD-10-CM | POA: Diagnosis not present

## 2017-06-17 DIAGNOSIS — N186 End stage renal disease: Secondary | ICD-10-CM | POA: Diagnosis not present

## 2017-06-18 DIAGNOSIS — N186 End stage renal disease: Secondary | ICD-10-CM | POA: Diagnosis not present

## 2017-06-19 DIAGNOSIS — N186 End stage renal disease: Secondary | ICD-10-CM | POA: Diagnosis not present

## 2017-06-20 DIAGNOSIS — N186 End stage renal disease: Secondary | ICD-10-CM | POA: Diagnosis not present

## 2017-06-21 DIAGNOSIS — I25119 Atherosclerotic heart disease of native coronary artery with unspecified angina pectoris: Secondary | ICD-10-CM | POA: Diagnosis not present

## 2017-06-21 DIAGNOSIS — D631 Anemia in chronic kidney disease: Secondary | ICD-10-CM | POA: Diagnosis not present

## 2017-06-21 DIAGNOSIS — Z992 Dependence on renal dialysis: Secondary | ICD-10-CM | POA: Diagnosis not present

## 2017-06-21 DIAGNOSIS — E1122 Type 2 diabetes mellitus with diabetic chronic kidney disease: Secondary | ICD-10-CM | POA: Diagnosis not present

## 2017-06-21 DIAGNOSIS — Z905 Acquired absence of kidney: Secondary | ICD-10-CM | POA: Diagnosis not present

## 2017-06-21 DIAGNOSIS — I5030 Unspecified diastolic (congestive) heart failure: Secondary | ICD-10-CM | POA: Diagnosis not present

## 2017-06-21 DIAGNOSIS — I132 Hypertensive heart and chronic kidney disease with heart failure and with stage 5 chronic kidney disease, or end stage renal disease: Secondary | ICD-10-CM | POA: Diagnosis not present

## 2017-06-21 DIAGNOSIS — Z951 Presence of aortocoronary bypass graft: Secondary | ICD-10-CM | POA: Diagnosis not present

## 2017-06-21 DIAGNOSIS — N186 End stage renal disease: Secondary | ICD-10-CM | POA: Diagnosis not present

## 2017-06-22 DIAGNOSIS — N186 End stage renal disease: Secondary | ICD-10-CM | POA: Diagnosis not present

## 2017-06-22 DIAGNOSIS — Z4932 Encounter for adequacy testing for peritoneal dialysis: Secondary | ICD-10-CM | POA: Diagnosis not present

## 2017-06-23 DIAGNOSIS — N186 End stage renal disease: Secondary | ICD-10-CM | POA: Diagnosis not present

## 2017-06-24 DIAGNOSIS — N186 End stage renal disease: Secondary | ICD-10-CM | POA: Diagnosis not present

## 2017-06-25 DIAGNOSIS — Z951 Presence of aortocoronary bypass graft: Secondary | ICD-10-CM | POA: Diagnosis not present

## 2017-06-25 DIAGNOSIS — Z992 Dependence on renal dialysis: Secondary | ICD-10-CM | POA: Diagnosis not present

## 2017-06-25 DIAGNOSIS — N186 End stage renal disease: Secondary | ICD-10-CM | POA: Diagnosis not present

## 2017-06-25 DIAGNOSIS — Z905 Acquired absence of kidney: Secondary | ICD-10-CM | POA: Diagnosis not present

## 2017-06-25 DIAGNOSIS — I5030 Unspecified diastolic (congestive) heart failure: Secondary | ICD-10-CM | POA: Diagnosis not present

## 2017-06-25 DIAGNOSIS — I25119 Atherosclerotic heart disease of native coronary artery with unspecified angina pectoris: Secondary | ICD-10-CM | POA: Diagnosis not present

## 2017-06-25 DIAGNOSIS — E1122 Type 2 diabetes mellitus with diabetic chronic kidney disease: Secondary | ICD-10-CM | POA: Diagnosis not present

## 2017-06-25 DIAGNOSIS — D631 Anemia in chronic kidney disease: Secondary | ICD-10-CM | POA: Diagnosis not present

## 2017-06-25 DIAGNOSIS — I132 Hypertensive heart and chronic kidney disease with heart failure and with stage 5 chronic kidney disease, or end stage renal disease: Secondary | ICD-10-CM | POA: Diagnosis not present

## 2017-06-26 DIAGNOSIS — N186 End stage renal disease: Secondary | ICD-10-CM | POA: Diagnosis not present

## 2017-06-26 DIAGNOSIS — N2581 Secondary hyperparathyroidism of renal origin: Secondary | ICD-10-CM | POA: Diagnosis not present

## 2017-06-26 DIAGNOSIS — D631 Anemia in chronic kidney disease: Secondary | ICD-10-CM | POA: Diagnosis not present

## 2017-06-27 DIAGNOSIS — N186 End stage renal disease: Secondary | ICD-10-CM | POA: Diagnosis not present

## 2017-06-27 DIAGNOSIS — N2581 Secondary hyperparathyroidism of renal origin: Secondary | ICD-10-CM | POA: Diagnosis not present

## 2017-06-27 DIAGNOSIS — D631 Anemia in chronic kidney disease: Secondary | ICD-10-CM | POA: Diagnosis not present

## 2017-06-27 DIAGNOSIS — D518 Other vitamin B12 deficiency anemias: Secondary | ICD-10-CM | POA: Diagnosis not present

## 2017-06-27 DIAGNOSIS — D529 Folate deficiency anemia, unspecified: Secondary | ICD-10-CM | POA: Diagnosis not present

## 2017-06-28 DIAGNOSIS — E1122 Type 2 diabetes mellitus with diabetic chronic kidney disease: Secondary | ICD-10-CM | POA: Diagnosis not present

## 2017-06-28 DIAGNOSIS — N186 End stage renal disease: Secondary | ICD-10-CM | POA: Diagnosis not present

## 2017-06-28 DIAGNOSIS — I132 Hypertensive heart and chronic kidney disease with heart failure and with stage 5 chronic kidney disease, or end stage renal disease: Secondary | ICD-10-CM | POA: Diagnosis not present

## 2017-06-28 DIAGNOSIS — Z905 Acquired absence of kidney: Secondary | ICD-10-CM | POA: Diagnosis not present

## 2017-06-28 DIAGNOSIS — I5030 Unspecified diastolic (congestive) heart failure: Secondary | ICD-10-CM | POA: Diagnosis not present

## 2017-06-28 DIAGNOSIS — D631 Anemia in chronic kidney disease: Secondary | ICD-10-CM | POA: Diagnosis not present

## 2017-06-28 DIAGNOSIS — Z992 Dependence on renal dialysis: Secondary | ICD-10-CM | POA: Diagnosis not present

## 2017-06-28 DIAGNOSIS — N2581 Secondary hyperparathyroidism of renal origin: Secondary | ICD-10-CM | POA: Diagnosis not present

## 2017-06-28 DIAGNOSIS — Z951 Presence of aortocoronary bypass graft: Secondary | ICD-10-CM | POA: Diagnosis not present

## 2017-06-28 DIAGNOSIS — I25119 Atherosclerotic heart disease of native coronary artery with unspecified angina pectoris: Secondary | ICD-10-CM | POA: Diagnosis not present

## 2017-06-29 DIAGNOSIS — D631 Anemia in chronic kidney disease: Secondary | ICD-10-CM | POA: Diagnosis not present

## 2017-06-29 DIAGNOSIS — N2581 Secondary hyperparathyroidism of renal origin: Secondary | ICD-10-CM | POA: Diagnosis not present

## 2017-06-29 DIAGNOSIS — N186 End stage renal disease: Secondary | ICD-10-CM | POA: Diagnosis not present

## 2017-06-30 DIAGNOSIS — D631 Anemia in chronic kidney disease: Secondary | ICD-10-CM | POA: Diagnosis not present

## 2017-06-30 DIAGNOSIS — N2581 Secondary hyperparathyroidism of renal origin: Secondary | ICD-10-CM | POA: Diagnosis not present

## 2017-06-30 DIAGNOSIS — N186 End stage renal disease: Secondary | ICD-10-CM | POA: Diagnosis not present

## 2017-07-01 DIAGNOSIS — D631 Anemia in chronic kidney disease: Secondary | ICD-10-CM | POA: Diagnosis not present

## 2017-07-01 DIAGNOSIS — N2581 Secondary hyperparathyroidism of renal origin: Secondary | ICD-10-CM | POA: Diagnosis not present

## 2017-07-01 DIAGNOSIS — N186 End stage renal disease: Secondary | ICD-10-CM | POA: Diagnosis not present

## 2017-07-02 DIAGNOSIS — N2581 Secondary hyperparathyroidism of renal origin: Secondary | ICD-10-CM | POA: Diagnosis not present

## 2017-07-02 DIAGNOSIS — D631 Anemia in chronic kidney disease: Secondary | ICD-10-CM | POA: Diagnosis not present

## 2017-07-02 DIAGNOSIS — N186 End stage renal disease: Secondary | ICD-10-CM | POA: Diagnosis not present

## 2017-07-03 DIAGNOSIS — N2581 Secondary hyperparathyroidism of renal origin: Secondary | ICD-10-CM | POA: Diagnosis not present

## 2017-07-03 DIAGNOSIS — D631 Anemia in chronic kidney disease: Secondary | ICD-10-CM | POA: Diagnosis not present

## 2017-07-03 DIAGNOSIS — N186 End stage renal disease: Secondary | ICD-10-CM | POA: Diagnosis not present

## 2017-07-04 DIAGNOSIS — N2581 Secondary hyperparathyroidism of renal origin: Secondary | ICD-10-CM | POA: Diagnosis not present

## 2017-07-04 DIAGNOSIS — N186 End stage renal disease: Secondary | ICD-10-CM | POA: Diagnosis not present

## 2017-07-04 DIAGNOSIS — D631 Anemia in chronic kidney disease: Secondary | ICD-10-CM | POA: Diagnosis not present

## 2017-07-05 DIAGNOSIS — D631 Anemia in chronic kidney disease: Secondary | ICD-10-CM | POA: Diagnosis not present

## 2017-07-05 DIAGNOSIS — Z992 Dependence on renal dialysis: Secondary | ICD-10-CM | POA: Diagnosis not present

## 2017-07-05 DIAGNOSIS — E1122 Type 2 diabetes mellitus with diabetic chronic kidney disease: Secondary | ICD-10-CM | POA: Diagnosis not present

## 2017-07-05 DIAGNOSIS — N2581 Secondary hyperparathyroidism of renal origin: Secondary | ICD-10-CM | POA: Diagnosis not present

## 2017-07-05 DIAGNOSIS — I25119 Atherosclerotic heart disease of native coronary artery with unspecified angina pectoris: Secondary | ICD-10-CM | POA: Diagnosis not present

## 2017-07-05 DIAGNOSIS — N186 End stage renal disease: Secondary | ICD-10-CM | POA: Diagnosis not present

## 2017-07-05 DIAGNOSIS — I5030 Unspecified diastolic (congestive) heart failure: Secondary | ICD-10-CM | POA: Diagnosis not present

## 2017-07-05 DIAGNOSIS — Z951 Presence of aortocoronary bypass graft: Secondary | ICD-10-CM | POA: Diagnosis not present

## 2017-07-05 DIAGNOSIS — Z905 Acquired absence of kidney: Secondary | ICD-10-CM | POA: Diagnosis not present

## 2017-07-05 DIAGNOSIS — I132 Hypertensive heart and chronic kidney disease with heart failure and with stage 5 chronic kidney disease, or end stage renal disease: Secondary | ICD-10-CM | POA: Diagnosis not present

## 2017-07-06 DIAGNOSIS — Z114 Encounter for screening for human immunodeficiency virus [HIV]: Secondary | ICD-10-CM | POA: Diagnosis not present

## 2017-07-06 DIAGNOSIS — N186 End stage renal disease: Secondary | ICD-10-CM | POA: Diagnosis not present

## 2017-07-06 DIAGNOSIS — Z1159 Encounter for screening for other viral diseases: Secondary | ICD-10-CM | POA: Diagnosis not present

## 2017-07-07 DIAGNOSIS — N186 End stage renal disease: Secondary | ICD-10-CM | POA: Diagnosis not present

## 2017-07-08 DIAGNOSIS — N186 End stage renal disease: Secondary | ICD-10-CM | POA: Diagnosis not present

## 2017-07-09 DIAGNOSIS — N186 End stage renal disease: Secondary | ICD-10-CM | POA: Diagnosis not present

## 2017-07-10 DIAGNOSIS — N186 End stage renal disease: Secondary | ICD-10-CM | POA: Diagnosis not present

## 2017-07-11 DIAGNOSIS — N186 End stage renal disease: Secondary | ICD-10-CM | POA: Diagnosis not present

## 2017-07-12 DIAGNOSIS — N186 End stage renal disease: Secondary | ICD-10-CM | POA: Diagnosis not present

## 2017-07-13 DIAGNOSIS — N186 End stage renal disease: Secondary | ICD-10-CM | POA: Diagnosis not present

## 2017-07-14 DIAGNOSIS — N186 End stage renal disease: Secondary | ICD-10-CM | POA: Diagnosis not present

## 2017-07-15 DIAGNOSIS — N186 End stage renal disease: Secondary | ICD-10-CM | POA: Diagnosis not present

## 2017-07-16 DIAGNOSIS — Z992 Dependence on renal dialysis: Secondary | ICD-10-CM | POA: Diagnosis not present

## 2017-07-16 DIAGNOSIS — Z794 Long term (current) use of insulin: Secondary | ICD-10-CM | POA: Diagnosis not present

## 2017-07-16 DIAGNOSIS — Z7982 Long term (current) use of aspirin: Secondary | ICD-10-CM | POA: Diagnosis not present

## 2017-07-16 DIAGNOSIS — Z951 Presence of aortocoronary bypass graft: Secondary | ICD-10-CM | POA: Diagnosis not present

## 2017-07-16 DIAGNOSIS — Z48812 Encounter for surgical aftercare following surgery on the circulatory system: Secondary | ICD-10-CM | POA: Diagnosis not present

## 2017-07-16 DIAGNOSIS — Z8673 Personal history of transient ischemic attack (TIA), and cerebral infarction without residual deficits: Secondary | ICD-10-CM

## 2017-07-16 DIAGNOSIS — I132 Hypertensive heart and chronic kidney disease with heart failure and with stage 5 chronic kidney disease, or end stage renal disease: Secondary | ICD-10-CM | POA: Diagnosis not present

## 2017-07-16 DIAGNOSIS — N186 End stage renal disease: Secondary | ICD-10-CM | POA: Diagnosis not present

## 2017-07-16 DIAGNOSIS — I5030 Unspecified diastolic (congestive) heart failure: Secondary | ICD-10-CM | POA: Diagnosis not present

## 2017-07-16 DIAGNOSIS — I25119 Atherosclerotic heart disease of native coronary artery with unspecified angina pectoris: Secondary | ICD-10-CM | POA: Diagnosis not present

## 2017-07-16 DIAGNOSIS — E1122 Type 2 diabetes mellitus with diabetic chronic kidney disease: Secondary | ICD-10-CM | POA: Diagnosis not present

## 2017-07-16 DIAGNOSIS — D631 Anemia in chronic kidney disease: Secondary | ICD-10-CM | POA: Diagnosis not present

## 2017-07-16 DIAGNOSIS — Z905 Acquired absence of kidney: Secondary | ICD-10-CM | POA: Diagnosis not present

## 2017-07-17 DIAGNOSIS — N186 End stage renal disease: Secondary | ICD-10-CM | POA: Diagnosis not present

## 2017-07-18 DIAGNOSIS — N186 End stage renal disease: Secondary | ICD-10-CM | POA: Diagnosis not present

## 2017-07-19 DIAGNOSIS — N186 End stage renal disease: Secondary | ICD-10-CM | POA: Diagnosis not present

## 2017-07-19 DIAGNOSIS — T8249XA Other complication of vascular dialysis catheter, initial encounter: Secondary | ICD-10-CM | POA: Diagnosis not present

## 2017-07-19 DIAGNOSIS — Z4901 Encounter for fitting and adjustment of extracorporeal dialysis catheter: Secondary | ICD-10-CM | POA: Diagnosis not present

## 2017-07-20 DIAGNOSIS — E1122 Type 2 diabetes mellitus with diabetic chronic kidney disease: Secondary | ICD-10-CM | POA: Diagnosis not present

## 2017-07-20 DIAGNOSIS — D631 Anemia in chronic kidney disease: Secondary | ICD-10-CM | POA: Diagnosis not present

## 2017-07-20 DIAGNOSIS — I25119 Atherosclerotic heart disease of native coronary artery with unspecified angina pectoris: Secondary | ICD-10-CM | POA: Diagnosis not present

## 2017-07-20 DIAGNOSIS — Z951 Presence of aortocoronary bypass graft: Secondary | ICD-10-CM | POA: Diagnosis not present

## 2017-07-20 DIAGNOSIS — Z905 Acquired absence of kidney: Secondary | ICD-10-CM | POA: Diagnosis not present

## 2017-07-20 DIAGNOSIS — I5030 Unspecified diastolic (congestive) heart failure: Secondary | ICD-10-CM | POA: Diagnosis not present

## 2017-07-20 DIAGNOSIS — I132 Hypertensive heart and chronic kidney disease with heart failure and with stage 5 chronic kidney disease, or end stage renal disease: Secondary | ICD-10-CM | POA: Diagnosis not present

## 2017-07-20 DIAGNOSIS — Z992 Dependence on renal dialysis: Secondary | ICD-10-CM | POA: Diagnosis not present

## 2017-07-20 DIAGNOSIS — N186 End stage renal disease: Secondary | ICD-10-CM | POA: Diagnosis not present

## 2017-07-21 DIAGNOSIS — N186 End stage renal disease: Secondary | ICD-10-CM | POA: Diagnosis not present

## 2017-07-22 DIAGNOSIS — N186 End stage renal disease: Secondary | ICD-10-CM | POA: Diagnosis not present

## 2017-07-23 DIAGNOSIS — N186 End stage renal disease: Secondary | ICD-10-CM | POA: Diagnosis not present

## 2017-07-23 DIAGNOSIS — I6523 Occlusion and stenosis of bilateral carotid arteries: Secondary | ICD-10-CM | POA: Diagnosis not present

## 2017-07-24 DIAGNOSIS — N186 End stage renal disease: Secondary | ICD-10-CM | POA: Diagnosis not present

## 2017-07-25 DIAGNOSIS — N186 End stage renal disease: Secondary | ICD-10-CM | POA: Diagnosis not present

## 2017-07-26 DIAGNOSIS — N186 End stage renal disease: Secondary | ICD-10-CM | POA: Diagnosis not present

## 2017-07-26 DIAGNOSIS — Z992 Dependence on renal dialysis: Secondary | ICD-10-CM | POA: Diagnosis not present

## 2017-07-27 DIAGNOSIS — E119 Type 2 diabetes mellitus without complications: Secondary | ICD-10-CM | POA: Diagnosis not present

## 2017-07-27 DIAGNOSIS — D631 Anemia in chronic kidney disease: Secondary | ICD-10-CM | POA: Diagnosis not present

## 2017-07-27 DIAGNOSIS — D509 Iron deficiency anemia, unspecified: Secondary | ICD-10-CM | POA: Diagnosis not present

## 2017-07-27 DIAGNOSIS — N186 End stage renal disease: Secondary | ICD-10-CM | POA: Diagnosis not present

## 2017-07-27 DIAGNOSIS — N2581 Secondary hyperparathyroidism of renal origin: Secondary | ICD-10-CM | POA: Diagnosis not present

## 2017-07-28 DIAGNOSIS — N2581 Secondary hyperparathyroidism of renal origin: Secondary | ICD-10-CM | POA: Diagnosis not present

## 2017-07-28 DIAGNOSIS — D509 Iron deficiency anemia, unspecified: Secondary | ICD-10-CM | POA: Diagnosis not present

## 2017-07-28 DIAGNOSIS — D631 Anemia in chronic kidney disease: Secondary | ICD-10-CM | POA: Diagnosis not present

## 2017-07-28 DIAGNOSIS — N186 End stage renal disease: Secondary | ICD-10-CM | POA: Diagnosis not present

## 2017-07-29 DIAGNOSIS — D509 Iron deficiency anemia, unspecified: Secondary | ICD-10-CM | POA: Diagnosis not present

## 2017-07-29 DIAGNOSIS — N186 End stage renal disease: Secondary | ICD-10-CM | POA: Diagnosis not present

## 2017-07-29 DIAGNOSIS — D631 Anemia in chronic kidney disease: Secondary | ICD-10-CM | POA: Diagnosis not present

## 2017-07-29 DIAGNOSIS — N2581 Secondary hyperparathyroidism of renal origin: Secondary | ICD-10-CM | POA: Diagnosis not present

## 2017-07-30 DIAGNOSIS — D631 Anemia in chronic kidney disease: Secondary | ICD-10-CM | POA: Diagnosis not present

## 2017-07-30 DIAGNOSIS — N186 End stage renal disease: Secondary | ICD-10-CM | POA: Diagnosis not present

## 2017-07-30 DIAGNOSIS — D509 Iron deficiency anemia, unspecified: Secondary | ICD-10-CM | POA: Diagnosis not present

## 2017-07-30 DIAGNOSIS — N2581 Secondary hyperparathyroidism of renal origin: Secondary | ICD-10-CM | POA: Diagnosis not present

## 2017-07-31 ENCOUNTER — Telehealth: Payer: Self-pay | Admitting: Internal Medicine

## 2017-07-31 DIAGNOSIS — N2581 Secondary hyperparathyroidism of renal origin: Secondary | ICD-10-CM | POA: Diagnosis not present

## 2017-07-31 DIAGNOSIS — D509 Iron deficiency anemia, unspecified: Secondary | ICD-10-CM | POA: Diagnosis not present

## 2017-07-31 DIAGNOSIS — N186 End stage renal disease: Secondary | ICD-10-CM | POA: Diagnosis not present

## 2017-07-31 DIAGNOSIS — D631 Anemia in chronic kidney disease: Secondary | ICD-10-CM | POA: Diagnosis not present

## 2017-07-31 NOTE — Telephone Encounter (Signed)
Copied from [place] [id number] [phone]. Topic: Quick Communication - See Telephone Encounter >> Jul 31, 2017  5:08 PM Arletha Grippe wrote: CRM for notification. See Telephone encounter for:   07/31/17 lauren  [facility] - verbal order for  Continue nursing 1 week 9  Chf, diabetes, central line pulled, pd recently replaced. Cb# [phone]

## 2017-08-01 DIAGNOSIS — N186 End stage renal disease: Secondary | ICD-10-CM | POA: Diagnosis not present

## 2017-08-01 DIAGNOSIS — D631 Anemia in chronic kidney disease: Secondary | ICD-10-CM | POA: Diagnosis not present

## 2017-08-01 DIAGNOSIS — D509 Iron deficiency anemia, unspecified: Secondary | ICD-10-CM | POA: Diagnosis not present

## 2017-08-01 DIAGNOSIS — N2581 Secondary hyperparathyroidism of renal origin: Secondary | ICD-10-CM | POA: Diagnosis not present

## 2017-08-02 DIAGNOSIS — D631 Anemia in chronic kidney disease: Secondary | ICD-10-CM | POA: Diagnosis not present

## 2017-08-02 DIAGNOSIS — N186 End stage renal disease: Secondary | ICD-10-CM | POA: Diagnosis not present

## 2017-08-02 DIAGNOSIS — N2581 Secondary hyperparathyroidism of renal origin: Secondary | ICD-10-CM | POA: Diagnosis not present

## 2017-08-02 DIAGNOSIS — D509 Iron deficiency anemia, unspecified: Secondary | ICD-10-CM | POA: Diagnosis not present

## 2017-08-02 NOTE — Telephone Encounter (Signed)
Ok Thx 

## 2017-08-02 NOTE — Telephone Encounter (Signed)
Called [name] no answer LMOM w/MD response...Mary Valencia

## 2017-08-03 DIAGNOSIS — N2581 Secondary hyperparathyroidism of renal origin: Secondary | ICD-10-CM | POA: Diagnosis not present

## 2017-08-03 DIAGNOSIS — D631 Anemia in chronic kidney disease: Secondary | ICD-10-CM | POA: Diagnosis not present

## 2017-08-03 DIAGNOSIS — N186 End stage renal disease: Secondary | ICD-10-CM | POA: Diagnosis not present

## 2017-08-03 DIAGNOSIS — D509 Iron deficiency anemia, unspecified: Secondary | ICD-10-CM | POA: Diagnosis not present

## 2017-08-04 DIAGNOSIS — D509 Iron deficiency anemia, unspecified: Secondary | ICD-10-CM | POA: Diagnosis not present

## 2017-08-04 DIAGNOSIS — N2581 Secondary hyperparathyroidism of renal origin: Secondary | ICD-10-CM | POA: Diagnosis not present

## 2017-08-04 DIAGNOSIS — D631 Anemia in chronic kidney disease: Secondary | ICD-10-CM | POA: Diagnosis not present

## 2017-08-04 DIAGNOSIS — N186 End stage renal disease: Secondary | ICD-10-CM | POA: Diagnosis not present

## 2017-08-05 DIAGNOSIS — D631 Anemia in chronic kidney disease: Secondary | ICD-10-CM | POA: Diagnosis not present

## 2017-08-05 DIAGNOSIS — N186 End stage renal disease: Secondary | ICD-10-CM | POA: Diagnosis not present

## 2017-08-05 DIAGNOSIS — N2581 Secondary hyperparathyroidism of renal origin: Secondary | ICD-10-CM | POA: Diagnosis not present

## 2017-08-05 DIAGNOSIS — D509 Iron deficiency anemia, unspecified: Secondary | ICD-10-CM | POA: Diagnosis not present

## 2017-08-06 DIAGNOSIS — N186 End stage renal disease: Secondary | ICD-10-CM | POA: Diagnosis not present

## 2017-08-07 ENCOUNTER — Telehealth: Payer: Self-pay | Admitting: Internal Medicine

## 2017-08-07 DIAGNOSIS — N186 End stage renal disease: Secondary | ICD-10-CM | POA: Diagnosis not present

## 2017-08-07 NOTE — Telephone Encounter (Signed)
Copied from [place]. Topic: Quick Communication - See Telephone Encounter >> Aug 07, 2017  4:31 PM Vernona Rieger wrote: CRM for notification. See Telephone encounter for:   08/07/17.  Mickel Baas from [facility] called and stated that she needs verbal orders to continue seeing her for nursing, she has faxed several times. She will be D/C soon if someone does not call her back. Heart Failure, diabetes, monitor CHF, one time a week for 8 weeks.   Call back is [phone]

## 2017-08-08 DIAGNOSIS — N186 End stage renal disease: Secondary | ICD-10-CM | POA: Diagnosis not present

## 2017-08-09 DIAGNOSIS — N186 End stage renal disease: Secondary | ICD-10-CM | POA: Diagnosis not present

## 2017-08-09 NOTE — Telephone Encounter (Signed)
OK. Thx

## 2017-08-09 NOTE — Telephone Encounter (Signed)
Notified Laura w/ MD response../lmb ?

## 2017-08-10 DIAGNOSIS — N186 End stage renal disease: Secondary | ICD-10-CM | POA: Diagnosis not present

## 2017-08-11 DIAGNOSIS — N186 End stage renal disease: Secondary | ICD-10-CM | POA: Diagnosis not present

## 2017-08-12 DIAGNOSIS — N186 End stage renal disease: Secondary | ICD-10-CM | POA: Diagnosis not present

## 2017-08-13 DIAGNOSIS — N186 End stage renal disease: Secondary | ICD-10-CM | POA: Diagnosis not present

## 2017-08-14 DIAGNOSIS — N186 End stage renal disease: Secondary | ICD-10-CM | POA: Diagnosis not present

## 2017-08-15 DIAGNOSIS — I25119 Atherosclerotic heart disease of native coronary artery with unspecified angina pectoris: Secondary | ICD-10-CM | POA: Diagnosis not present

## 2017-08-15 DIAGNOSIS — I5030 Unspecified diastolic (congestive) heart failure: Secondary | ICD-10-CM | POA: Diagnosis not present

## 2017-08-15 DIAGNOSIS — D631 Anemia in chronic kidney disease: Secondary | ICD-10-CM | POA: Diagnosis not present

## 2017-08-15 DIAGNOSIS — E1122 Type 2 diabetes mellitus with diabetic chronic kidney disease: Secondary | ICD-10-CM | POA: Diagnosis not present

## 2017-08-15 DIAGNOSIS — I132 Hypertensive heart and chronic kidney disease with heart failure and with stage 5 chronic kidney disease, or end stage renal disease: Secondary | ICD-10-CM | POA: Diagnosis not present

## 2017-08-15 DIAGNOSIS — N186 End stage renal disease: Secondary | ICD-10-CM | POA: Diagnosis not present

## 2017-08-15 DIAGNOSIS — Z951 Presence of aortocoronary bypass graft: Secondary | ICD-10-CM | POA: Diagnosis not present

## 2017-08-15 DIAGNOSIS — Z905 Acquired absence of kidney: Secondary | ICD-10-CM | POA: Diagnosis not present

## 2017-08-15 DIAGNOSIS — Z992 Dependence on renal dialysis: Secondary | ICD-10-CM | POA: Diagnosis not present

## 2017-08-16 DIAGNOSIS — N186 End stage renal disease: Secondary | ICD-10-CM | POA: Diagnosis not present

## 2017-08-17 DIAGNOSIS — N186 End stage renal disease: Secondary | ICD-10-CM | POA: Diagnosis not present

## 2017-08-18 DIAGNOSIS — N186 End stage renal disease: Secondary | ICD-10-CM | POA: Diagnosis not present

## 2017-08-19 DIAGNOSIS — N186 End stage renal disease: Secondary | ICD-10-CM | POA: Diagnosis not present

## 2017-08-20 DIAGNOSIS — N186 End stage renal disease: Secondary | ICD-10-CM | POA: Diagnosis not present

## 2017-08-21 DIAGNOSIS — N186 End stage renal disease: Secondary | ICD-10-CM | POA: Diagnosis not present

## 2017-08-22 DIAGNOSIS — N186 End stage renal disease: Secondary | ICD-10-CM | POA: Diagnosis not present

## 2017-08-23 ENCOUNTER — Ambulatory Visit: Payer: Self-pay | Admitting: *Deleted

## 2017-08-23 DIAGNOSIS — I25119 Atherosclerotic heart disease of native coronary artery with unspecified angina pectoris: Secondary | ICD-10-CM | POA: Diagnosis not present

## 2017-08-23 DIAGNOSIS — Z992 Dependence on renal dialysis: Secondary | ICD-10-CM | POA: Diagnosis not present

## 2017-08-23 DIAGNOSIS — N186 End stage renal disease: Secondary | ICD-10-CM | POA: Diagnosis not present

## 2017-08-23 DIAGNOSIS — I5030 Unspecified diastolic (congestive) heart failure: Secondary | ICD-10-CM | POA: Diagnosis not present

## 2017-08-23 DIAGNOSIS — D631 Anemia in chronic kidney disease: Secondary | ICD-10-CM | POA: Diagnosis not present

## 2017-08-23 DIAGNOSIS — E1122 Type 2 diabetes mellitus with diabetic chronic kidney disease: Secondary | ICD-10-CM | POA: Diagnosis not present

## 2017-08-23 DIAGNOSIS — I132 Hypertensive heart and chronic kidney disease with heart failure and with stage 5 chronic kidney disease, or end stage renal disease: Secondary | ICD-10-CM | POA: Diagnosis not present

## 2017-08-23 DIAGNOSIS — Z951 Presence of aortocoronary bypass graft: Secondary | ICD-10-CM | POA: Diagnosis not present

## 2017-08-23 DIAGNOSIS — Z905 Acquired absence of kidney: Secondary | ICD-10-CM | POA: Diagnosis not present

## 2017-08-23 NOTE — Telephone Encounter (Signed)
The [facility] nurse, [name] called with concerns about a knot on the left lateral side of the pt's leg 6 inches above the ankle that has been there for 6 weeks.  See triage notes for assessment.  I made an appt with Jodi Mourning, NP for 08/24/17 at 10:00.    Reason for Disposition . [1] Small swelling or lump AND [2] unexplained AND [3] present > 1 week  Answer Assessment - Initial Assessment Questions 1. APPEARANCE of SWELLING: "What does it look like?" (e.g., lymph node, insect bite, mole)     About the circumference of a golf ball but not that big.   No bite marks.   2. SIZE: "How large is the swelling?" (inches, cm or compare to coins)     See above 3. LOCATION: "Where is the swelling located?"     Left lateral leg.   6 inches above her ankle. 4. ONSET: "When did the swelling start?"     Been there for 6 weeks.  5. PAIN: "Is it painful?" If so, ask: "How much?"     It's sore and has not changed over the 6 weeks. 6. ITCH: "Does it itch?" If so, ask: "How much?"      7. CAUSE: "What do you think caused the swelling?"     Does not know.  No injuries/falls, etc 8. OTHER SYMPTOMS: "Do you have any other symptoms?" (e.g., fever)     No  Protocols used: SKIN LUMP OR LOCALIZED SWELLING-A-AH

## 2017-08-24 ENCOUNTER — Ambulatory Visit: Payer: Medicare HMO | Admitting: Family

## 2017-08-24 DIAGNOSIS — N2581 Secondary hyperparathyroidism of renal origin: Secondary | ICD-10-CM | POA: Diagnosis not present

## 2017-08-24 DIAGNOSIS — D631 Anemia in chronic kidney disease: Secondary | ICD-10-CM | POA: Diagnosis not present

## 2017-08-24 DIAGNOSIS — N186 End stage renal disease: Secondary | ICD-10-CM | POA: Diagnosis not present

## 2017-08-24 DIAGNOSIS — D509 Iron deficiency anemia, unspecified: Secondary | ICD-10-CM | POA: Diagnosis not present

## 2017-08-25 DIAGNOSIS — N186 End stage renal disease: Secondary | ICD-10-CM | POA: Diagnosis not present

## 2017-08-25 DIAGNOSIS — N2581 Secondary hyperparathyroidism of renal origin: Secondary | ICD-10-CM | POA: Diagnosis not present

## 2017-08-25 DIAGNOSIS — D631 Anemia in chronic kidney disease: Secondary | ICD-10-CM | POA: Diagnosis not present

## 2017-08-25 DIAGNOSIS — D509 Iron deficiency anemia, unspecified: Secondary | ICD-10-CM | POA: Diagnosis not present

## 2017-08-26 DIAGNOSIS — D509 Iron deficiency anemia, unspecified: Secondary | ICD-10-CM | POA: Diagnosis not present

## 2017-08-26 DIAGNOSIS — N186 End stage renal disease: Secondary | ICD-10-CM | POA: Diagnosis not present

## 2017-08-26 DIAGNOSIS — D631 Anemia in chronic kidney disease: Secondary | ICD-10-CM | POA: Diagnosis not present

## 2017-08-26 DIAGNOSIS — N2581 Secondary hyperparathyroidism of renal origin: Secondary | ICD-10-CM | POA: Diagnosis not present

## 2017-08-27 ENCOUNTER — Encounter: Payer: Self-pay | Admitting: Family

## 2017-08-27 ENCOUNTER — Ambulatory Visit (INDEPENDENT_AMBULATORY_CARE_PROVIDER_SITE_OTHER): Payer: Medicare HMO | Admitting: Family

## 2017-08-27 VITALS — BP 138/66 | HR 64 | Temp 98.6°F | Ht 62.0 in | Wt 142.0 lb

## 2017-08-27 DIAGNOSIS — M6740 Ganglion, unspecified site: Secondary | ICD-10-CM | POA: Diagnosis not present

## 2017-08-27 DIAGNOSIS — N186 End stage renal disease: Secondary | ICD-10-CM | POA: Diagnosis not present

## 2017-08-27 DIAGNOSIS — D631 Anemia in chronic kidney disease: Secondary | ICD-10-CM | POA: Diagnosis not present

## 2017-08-27 DIAGNOSIS — N2581 Secondary hyperparathyroidism of renal origin: Secondary | ICD-10-CM | POA: Diagnosis not present

## 2017-08-27 DIAGNOSIS — D509 Iron deficiency anemia, unspecified: Secondary | ICD-10-CM | POA: Diagnosis not present

## 2017-08-27 NOTE — Patient Instructions (Signed)
Ganglion Cyst A ganglion cyst is a noncancerous, fluid-filled lump that occurs near joints or tendons. The ganglion cyst grows out of a joint or the lining of a tendon. It most often develops in the hand or wrist, but it can also develop in the shoulder, elbow, hip, knee, ankle, or foot. The round or oval ganglion cyst can be the size of a pea or larger than a grape. Increased activity may enlarge the size of the cyst because more fluid starts to build up. What are the causes? It is not known what causes a ganglion cyst to grow. However, it may be related to:  Inflammation or irritation around the joint.  An injury.  Repetitive movements or overuse.  Arthritis.  What increases the risk? Risk factors include:  Being a woman.  Being age 20-50.  What are the signs or symptoms? Symptoms may include:  A lump. This most often appears on the hand or wrist, but it can occur in other areas of the body.  Tingling.  Pain.  Numbness.  Muscle weakness.  Weak grip.  Less movement in a joint.  How is this diagnosed? Ganglion cysts are most often diagnosed based on a physical exam. Your health care provider will feel the lump and may shine a light alongside it. If it is a ganglion cyst, a light often shines through it. Your health care provider may order an X-ray, ultrasound, or MRI to rule out other conditions. How is this treated? Ganglion cysts usually go away on their own without treatment. If pain or other symptoms are involved, treatment may be needed. Treatment is also needed if the ganglion cyst limits your movement or if it gets infected. Treatment may include:  Wearing a brace or splint on your wrist or finger.  Taking anti-inflammatory medicine.  Draining fluid from the lump with a needle (aspiration).  Injecting a steroid into the joint.  Surgery to remove the ganglion cyst.  Follow these instructions at home:  Do not press on the ganglion cyst, poke it with a  needle, or hit it.  Take medicines only as directed by your health care provider.  Wear your brace or splint as directed by your health care provider.  Watch your ganglion cyst for any changes.  Keep all follow-up visits as directed by your health care provider. This is important. Contact a health care provider if:  Your ganglion cyst becomes larger or more painful.  You have increased redness, red streaks, or swelling.  You have pus coming from the lump.  You have weakness or numbness in the affected area.  You have a fever or chills. This information is not intended to replace advice given to you by your health care provider. Make sure you discuss any questions you have with your health care provider. Document Released: 06/09/2000 Document Revised: 11/18/2015 Document Reviewed: 11/25/2013 Elsevier Interactive Patient Education  2018 Elsevier Inc.  

## 2017-08-27 NOTE — Progress Notes (Signed)
Mary Valencia is a 62 y.o. female with the following history as recorded in Epic[facility]:  Patient Active Problem List   Diagnosis Date Noted  . CRF (chronic renal failure) 10/06/2015  . Situational mixed anxiety and depressive disorder 10/06/2015  . Hyperkalemia 02/01/2013  . Edema 10/27/2012  . TIA (transient ischemic attack) 03/21/2012  . Encephalopathy acute 01/29/2012  . Weight loss 01/24/2012  . LBP (low back pain) 01/24/2012  . Chest pain, atypical 09/25/2011  . Vagina bleeding 01/18/2011  . ALLERGIC RHINITIS 09/05/2010  . TACHYCARDIA 09/05/2010  . DYSPNEA 09/05/2010  . Diabetes mellitus type 2 with complications ([place]) 93/81/0175  . HYPERLIPIDEMIA 02/19/2007  . Essential hypertension 02/19/2007  . GERD 02/19/2007  . WEIGHT GAIN 02/19/2007    Current Outpatient Medications  Medication Sig Dispense Refill  . ACCU-CHEK FASTCLIX LANCETS MISC Use to check blood glucose TID (DX: E11.8, Z79.4) 300 each 3  . Alcohol Swabs (B-D SINGLE USE SWABS REGULAR) PADS Use as directed 100 each 3  . Blood Glucose Calibration (ACCU-CHEK SMARTVIEW CONTROL) LIQD Use as directed 1 each 1  . Blood Glucose Monitoring Suppl (ACCU-CHEK NANO SMARTVIEW) w/Device KIT Use to check blood glucose TID (DX: E11.8, Z79.4) 1 kit 0  . calcium carbonate (TUMS - DOSED IN MG ELEMENTAL CALCIUM) 500 MG chewable tablet     . CALCIUM-MAGNESIUM-VITAMIN D PO Take 1 tablet by mouth daily.    . diphenhydrAMINE (BENADRYL) 25 mg capsule Take 25 mg by mouth as needed.    Marland Kitchen glucose blood (ACCU-CHEK SMARTVIEW) test strip Use to check blood glucose TID (DX: E11.8, Z79.4) 300 each 3  . hydrALAZINE (APRESOLINE) 100 MG tablet Take 100 mg by mouth 3 (three) times daily.    . insulin NPH-regular Human (NOVOLIN 70/30) (70-30) 100 UNIT/ML injection Inject 10 Units into the skin 2 (two) times daily.    Marland Kitchen losartan (COZAAR) 100 MG tablet Take 100 mg by mouth daily.    Marland Kitchen NIFEdipine (PROCARDIA XL/ADALAT-CC) 60 MG 24 hr tablet     .  omeprazole (PRILOSEC) 40 MG capsule Take 40 mg by mouth daily.    . polyethylene glycol (MIRALAX / GLYCOLAX) packet Take 17 g by mouth.    . senna-docusate (SENOKOT-S) 8.6-50 MG tablet Take by mouth 2 (two) times daily. Take 2 tab twice daily.    Marland Kitchen torsemide (DEMADEX) 20 MG tablet Take 40 mg by mouth 2 (two) times daily.     Marland Kitchen labetalol (NORMODYNE) 200 MG tablet Take 600 mg by mouth 3 (three) times daily.     . rosuvastatin (CRESTOR) 20 MG tablet Take 20 mg by mouth daily.     No current facility-administered medications for this visit.     Allergies: Betadine [povidone iodine]; Clonidine derivatives; Gabapentin; Iodine; Penicillins; Sulfonamide derivatives; Tetracyclines & related; and Naproxen  Past Medical History:  Diagnosis Date  . Allergic rhinitis   . 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 Edema 10/27/2012   L>R  . GERD (gastroesophageal reflux disease)   . HTN (hypertension)   . Hyperlipidemia   . Type II or unspecified type diabetes mellitus without mention of complication, not stated as uncontrolled     Past Surgical History:  Procedure Laterality Date  . ABDOMINAL HYSTERECTOMY     partial  . KIDNEY SURGERY     right removed  . NEPHRECTOMY     right - related to untreated strep infection and stones  . TONSILLECTOMY      Family History  Problem  Relation Age of Onset  . Heart disease Mother 50       MI  . Heart disease Father 29       MI  . Alcohol abuse Sister   . Kidney disease Sister   . Heart disease Brother        ?CHF ?CAD  . Heart disease Maternal Aunt   . Diabetes Other     Social History   Tobacco Use  . Smoking status: Never Smoker  . Smokeless tobacco: Never Used  Substance Use Topics  . Alcohol use: No    Subjective:  Patient presents with "lump" on outside of lower left leg; notes can be occasionally tender to touch; no warmth or swelling; does not remember any injury or trauma; notes that the area will fluctuate in size- often gets  smaller but never any larger; currently area is about the size of a quarter;   Objective:  Vitals:   08/27/17 1125  BP: 138/66  Pulse: 64  Temp: 98.6 F (37 C)  TempSrc: Oral  SpO2: 100%  Weight: 142 lb (64.4 kg)  Height: 5' 2" (1.575 m)    General: Well developed, well nourished, in no acute distress  Skin : Warm and dry.  Head: Normocephalic and atraumatic  Lungs: Respirations unlabored; clear to auscultation bilaterally without wheeze, rales, rhonchi  Extremities: No edema, cyanosis, clubbing; cystic area noted outer lateral leg Vessels: Symmetric bilaterally  Neurologic: Alert and oriented; speech intact; face symmetrical; moves all extremities well; CNII-XII intact without focal deficit   Assessment:  1. Ganglion cyst     Plan:  Reassurance; no infection noted; will update ultrasound as needed; follow-up to be determined;  Majority of healthcare being managed by specialists;  No Follow-up on file.  Orders Placed This Encounter  Procedures  . [place] LT LOWER EXTREM LTD SOFT TISSUE NON VASCULAR    Standing Status:   Future    Standing Expiration Date:   10/28/2018    Order Specific Question:   Reason for Exam (SYMPTOM  OR DIAGNOSIS REQUIRED)    Answer:   evaluate for ganglion    Order Specific Question:   Preferred imaging location?    Answer:   [facility]    Requested Prescriptions    No prescriptions requested or ordered in this encounter

## 2017-08-28 DIAGNOSIS — N2581 Secondary hyperparathyroidism of renal origin: Secondary | ICD-10-CM | POA: Diagnosis not present

## 2017-08-28 DIAGNOSIS — D509 Iron deficiency anemia, unspecified: Secondary | ICD-10-CM | POA: Diagnosis not present

## 2017-08-28 DIAGNOSIS — D631 Anemia in chronic kidney disease: Secondary | ICD-10-CM | POA: Diagnosis not present

## 2017-08-28 DIAGNOSIS — N186 End stage renal disease: Secondary | ICD-10-CM | POA: Diagnosis not present

## 2017-08-29 DIAGNOSIS — D509 Iron deficiency anemia, unspecified: Secondary | ICD-10-CM | POA: Diagnosis not present

## 2017-08-29 DIAGNOSIS — N186 End stage renal disease: Secondary | ICD-10-CM | POA: Diagnosis not present

## 2017-08-29 DIAGNOSIS — D631 Anemia in chronic kidney disease: Secondary | ICD-10-CM | POA: Diagnosis not present

## 2017-08-29 DIAGNOSIS — N2581 Secondary hyperparathyroidism of renal origin: Secondary | ICD-10-CM | POA: Diagnosis not present

## 2017-08-29 LAB — HEPATIC FUNCTION PANEL
ALT: 26 (ref 7–35)
AST: 58 — AB (ref 13–35)

## 2017-08-29 LAB — CBC AND DIFFERENTIAL
HEMATOCRIT: 30 — AB (ref 36–46)
HEMOGLOBIN: 9.7 — AB (ref 12.0–16.0)
Platelets: 203 (ref 150–399)
WBC: 7.7

## 2017-08-29 LAB — BASIC METABOLIC PANEL
BUN: 68 — AB (ref 4–21)
Creatinine: 13.6 — AB (ref 0.5–1.1)
Potassium: 4.7 (ref 3.4–5.3)
SODIUM: 103 — AB (ref 137–147)

## 2017-08-30 ENCOUNTER — Telehealth: Payer: Self-pay | Admitting: Internal Medicine

## 2017-08-30 ENCOUNTER — Ambulatory Visit (HOSPITAL_BASED_OUTPATIENT_CLINIC_OR_DEPARTMENT_OTHER)
Admission: RE | Admit: 2017-08-30 | Discharge: 2017-08-30 | Disposition: A | Discharge status: Home / Self Care | Payer: Medicare HMO | Source: Ambulatory Visit | Attending: Family | Admitting: Family

## 2017-08-30 DIAGNOSIS — Z905 Acquired absence of kidney: Secondary | ICD-10-CM | POA: Diagnosis not present

## 2017-08-30 DIAGNOSIS — N186 End stage renal disease: Secondary | ICD-10-CM | POA: Diagnosis not present

## 2017-08-30 DIAGNOSIS — M6740 Ganglion, unspecified site: Secondary | ICD-10-CM

## 2017-08-30 DIAGNOSIS — I5030 Unspecified diastolic (congestive) heart failure: Secondary | ICD-10-CM | POA: Diagnosis not present

## 2017-08-30 DIAGNOSIS — M7989 Other specified soft tissue disorders: Secondary | ICD-10-CM | POA: Diagnosis not present

## 2017-08-30 DIAGNOSIS — D509 Iron deficiency anemia, unspecified: Secondary | ICD-10-CM | POA: Diagnosis not present

## 2017-08-30 DIAGNOSIS — E1122 Type 2 diabetes mellitus with diabetic chronic kidney disease: Secondary | ICD-10-CM | POA: Diagnosis not present

## 2017-08-30 DIAGNOSIS — I132 Hypertensive heart and chronic kidney disease with heart failure and with stage 5 chronic kidney disease, or end stage renal disease: Secondary | ICD-10-CM | POA: Diagnosis not present

## 2017-08-30 DIAGNOSIS — Z992 Dependence on renal dialysis: Secondary | ICD-10-CM | POA: Diagnosis not present

## 2017-08-30 DIAGNOSIS — I25119 Atherosclerotic heart disease of native coronary artery with unspecified angina pectoris: Secondary | ICD-10-CM | POA: Diagnosis not present

## 2017-08-30 DIAGNOSIS — Z951 Presence of aortocoronary bypass graft: Secondary | ICD-10-CM | POA: Diagnosis not present

## 2017-08-30 DIAGNOSIS — N2581 Secondary hyperparathyroidism of renal origin: Secondary | ICD-10-CM | POA: Diagnosis not present

## 2017-08-30 DIAGNOSIS — R600 Localized edema: Secondary | ICD-10-CM | POA: Diagnosis not present

## 2017-08-30 DIAGNOSIS — D631 Anemia in chronic kidney disease: Secondary | ICD-10-CM | POA: Diagnosis not present

## 2017-08-30 NOTE — Telephone Encounter (Signed)
Copied from [place] [phone]. Topic: Referral - Request >> Aug 30, 2017 11:18 AM Ether Griffins B wrote: Reason for CRM: pt requesting referral to liver specialist.   ** Called and spoke with patient to get more information. Patients has not see Dr. Camila Li since June 2018. And has not talked with him before about this. She was informed she may need to get an appointment to have this approved. She is requesting this because her Nephrology doctor in [facility] found something and should have sent the OV notes over. I believe the doctor is Dr. Maryclare Labrador. It is the closes thing I could find for which name she gave me. Stated she may need to have her liver tested with a liver specialist and to ask her PCP to put the referral in.   Please advise, Thank you.

## 2017-08-30 NOTE — Telephone Encounter (Signed)
Please advise 

## 2017-08-31 DIAGNOSIS — D509 Iron deficiency anemia, unspecified: Secondary | ICD-10-CM | POA: Diagnosis not present

## 2017-08-31 DIAGNOSIS — N2581 Secondary hyperparathyroidism of renal origin: Secondary | ICD-10-CM | POA: Diagnosis not present

## 2017-08-31 DIAGNOSIS — D631 Anemia in chronic kidney disease: Secondary | ICD-10-CM | POA: Diagnosis not present

## 2017-08-31 DIAGNOSIS — N186 End stage renal disease: Secondary | ICD-10-CM | POA: Diagnosis not present

## 2017-08-31 NOTE — Telephone Encounter (Signed)
Please call pt to schedule appt. Thanks!

## 2017-08-31 NOTE — Telephone Encounter (Signed)
Appointment made for 3/29 at 10am.

## 2017-08-31 NOTE — Telephone Encounter (Signed)
Unable to locate any dated.  Please make an office visit appointment and bring or get the test results.  Thank you

## 2017-09-01 DIAGNOSIS — N2581 Secondary hyperparathyroidism of renal origin: Secondary | ICD-10-CM | POA: Diagnosis not present

## 2017-09-01 DIAGNOSIS — D509 Iron deficiency anemia, unspecified: Secondary | ICD-10-CM | POA: Diagnosis not present

## 2017-09-01 DIAGNOSIS — N186 End stage renal disease: Secondary | ICD-10-CM | POA: Diagnosis not present

## 2017-09-01 DIAGNOSIS — D631 Anemia in chronic kidney disease: Secondary | ICD-10-CM | POA: Diagnosis not present

## 2017-09-02 DIAGNOSIS — N2581 Secondary hyperparathyroidism of renal origin: Secondary | ICD-10-CM | POA: Diagnosis not present

## 2017-09-02 DIAGNOSIS — D631 Anemia in chronic kidney disease: Secondary | ICD-10-CM | POA: Diagnosis not present

## 2017-09-02 DIAGNOSIS — D509 Iron deficiency anemia, unspecified: Secondary | ICD-10-CM | POA: Diagnosis not present

## 2017-09-02 DIAGNOSIS — N186 End stage renal disease: Secondary | ICD-10-CM | POA: Diagnosis not present

## 2017-09-03 DIAGNOSIS — N186 End stage renal disease: Secondary | ICD-10-CM | POA: Diagnosis not present

## 2017-09-04 DIAGNOSIS — N186 End stage renal disease: Secondary | ICD-10-CM | POA: Diagnosis not present

## 2017-09-05 DIAGNOSIS — N186 End stage renal disease: Secondary | ICD-10-CM | POA: Diagnosis not present

## 2017-09-06 DIAGNOSIS — N186 End stage renal disease: Secondary | ICD-10-CM | POA: Diagnosis not present

## 2017-09-07 DIAGNOSIS — Z951 Presence of aortocoronary bypass graft: Secondary | ICD-10-CM | POA: Diagnosis not present

## 2017-09-07 DIAGNOSIS — I132 Hypertensive heart and chronic kidney disease with heart failure and with stage 5 chronic kidney disease, or end stage renal disease: Secondary | ICD-10-CM | POA: Diagnosis not present

## 2017-09-07 DIAGNOSIS — E1122 Type 2 diabetes mellitus with diabetic chronic kidney disease: Secondary | ICD-10-CM | POA: Diagnosis not present

## 2017-09-07 DIAGNOSIS — I25119 Atherosclerotic heart disease of native coronary artery with unspecified angina pectoris: Secondary | ICD-10-CM | POA: Diagnosis not present

## 2017-09-07 DIAGNOSIS — Z992 Dependence on renal dialysis: Secondary | ICD-10-CM | POA: Diagnosis not present

## 2017-09-07 DIAGNOSIS — Z905 Acquired absence of kidney: Secondary | ICD-10-CM | POA: Diagnosis not present

## 2017-09-07 DIAGNOSIS — I5030 Unspecified diastolic (congestive) heart failure: Secondary | ICD-10-CM | POA: Diagnosis not present

## 2017-09-07 DIAGNOSIS — D631 Anemia in chronic kidney disease: Secondary | ICD-10-CM | POA: Diagnosis not present

## 2017-09-07 DIAGNOSIS — N186 End stage renal disease: Secondary | ICD-10-CM | POA: Diagnosis not present

## 2017-09-08 DIAGNOSIS — Y848 Other medical procedures as the cause of abnormal reaction of the patient, or of later complication, without mention of misadventure at the time of the procedure: Secondary | ICD-10-CM | POA: Diagnosis not present

## 2017-09-08 DIAGNOSIS — T85611A Breakdown (mechanical) of intraperitoneal dialysis catheter, initial encounter: Secondary | ICD-10-CM | POA: Diagnosis not present

## 2017-09-08 DIAGNOSIS — N186 End stage renal disease: Secondary | ICD-10-CM | POA: Diagnosis not present

## 2017-09-09 DIAGNOSIS — N186 End stage renal disease: Secondary | ICD-10-CM | POA: Diagnosis not present

## 2017-09-10 DIAGNOSIS — N186 End stage renal disease: Secondary | ICD-10-CM | POA: Diagnosis not present

## 2017-09-11 DIAGNOSIS — N186 End stage renal disease: Secondary | ICD-10-CM | POA: Diagnosis not present

## 2017-09-12 DIAGNOSIS — I517 Cardiomegaly: Secondary | ICD-10-CM | POA: Diagnosis not present

## 2017-09-12 DIAGNOSIS — N186 End stage renal disease: Secondary | ICD-10-CM | POA: Diagnosis not present

## 2017-09-12 DIAGNOSIS — I083 Combined rheumatic disorders of mitral, aortic and tricuspid valves: Secondary | ICD-10-CM | POA: Diagnosis not present

## 2017-09-12 DIAGNOSIS — Z01818 Encounter for other preprocedural examination: Secondary | ICD-10-CM | POA: Diagnosis not present

## 2017-09-13 DIAGNOSIS — I25119 Atherosclerotic heart disease of native coronary artery with unspecified angina pectoris: Secondary | ICD-10-CM | POA: Diagnosis not present

## 2017-09-13 DIAGNOSIS — N186 End stage renal disease: Secondary | ICD-10-CM | POA: Diagnosis not present

## 2017-09-13 DIAGNOSIS — Z905 Acquired absence of kidney: Secondary | ICD-10-CM | POA: Diagnosis not present

## 2017-09-13 DIAGNOSIS — Z951 Presence of aortocoronary bypass graft: Secondary | ICD-10-CM | POA: Diagnosis not present

## 2017-09-13 DIAGNOSIS — E1122 Type 2 diabetes mellitus with diabetic chronic kidney disease: Secondary | ICD-10-CM | POA: Diagnosis not present

## 2017-09-13 DIAGNOSIS — I132 Hypertensive heart and chronic kidney disease with heart failure and with stage 5 chronic kidney disease, or end stage renal disease: Secondary | ICD-10-CM | POA: Diagnosis not present

## 2017-09-13 DIAGNOSIS — D631 Anemia in chronic kidney disease: Secondary | ICD-10-CM | POA: Diagnosis not present

## 2017-09-13 DIAGNOSIS — I5030 Unspecified diastolic (congestive) heart failure: Secondary | ICD-10-CM | POA: Diagnosis not present

## 2017-09-13 DIAGNOSIS — Z992 Dependence on renal dialysis: Secondary | ICD-10-CM | POA: Diagnosis not present

## 2017-09-14 DIAGNOSIS — N186 End stage renal disease: Secondary | ICD-10-CM | POA: Diagnosis not present

## 2017-09-15 DIAGNOSIS — N186 End stage renal disease: Secondary | ICD-10-CM | POA: Diagnosis not present

## 2017-09-16 DIAGNOSIS — N186 End stage renal disease: Secondary | ICD-10-CM | POA: Diagnosis not present

## 2017-09-17 DIAGNOSIS — Z4932 Encounter for adequacy testing for peritoneal dialysis: Secondary | ICD-10-CM | POA: Diagnosis not present

## 2017-09-17 DIAGNOSIS — N186 End stage renal disease: Secondary | ICD-10-CM | POA: Diagnosis not present

## 2017-09-18 DIAGNOSIS — N186 End stage renal disease: Secondary | ICD-10-CM | POA: Diagnosis not present

## 2017-09-19 DIAGNOSIS — I25119 Atherosclerotic heart disease of native coronary artery with unspecified angina pectoris: Secondary | ICD-10-CM | POA: Diagnosis not present

## 2017-09-19 DIAGNOSIS — I132 Hypertensive heart and chronic kidney disease with heart failure and with stage 5 chronic kidney disease, or end stage renal disease: Secondary | ICD-10-CM | POA: Diagnosis not present

## 2017-09-19 DIAGNOSIS — E1122 Type 2 diabetes mellitus with diabetic chronic kidney disease: Secondary | ICD-10-CM | POA: Diagnosis not present

## 2017-09-19 DIAGNOSIS — Z992 Dependence on renal dialysis: Secondary | ICD-10-CM | POA: Diagnosis not present

## 2017-09-19 DIAGNOSIS — Z905 Acquired absence of kidney: Secondary | ICD-10-CM | POA: Diagnosis not present

## 2017-09-19 DIAGNOSIS — Z951 Presence of aortocoronary bypass graft: Secondary | ICD-10-CM | POA: Diagnosis not present

## 2017-09-19 DIAGNOSIS — N186 End stage renal disease: Secondary | ICD-10-CM | POA: Diagnosis not present

## 2017-09-19 DIAGNOSIS — I5030 Unspecified diastolic (congestive) heart failure: Secondary | ICD-10-CM | POA: Diagnosis not present

## 2017-09-19 DIAGNOSIS — D631 Anemia in chronic kidney disease: Secondary | ICD-10-CM | POA: Diagnosis not present

## 2017-09-20 DIAGNOSIS — N186 End stage renal disease: Secondary | ICD-10-CM | POA: Diagnosis not present

## 2017-09-21 ENCOUNTER — Ambulatory Visit (INDEPENDENT_AMBULATORY_CARE_PROVIDER_SITE_OTHER): Payer: Medicare HMO | Admitting: Internal Medicine

## 2017-09-21 ENCOUNTER — Encounter: Payer: Self-pay | Admitting: Internal Medicine

## 2017-09-21 ENCOUNTER — Telehealth: Payer: Self-pay

## 2017-09-21 ENCOUNTER — Other Ambulatory Visit (INDEPENDENT_AMBULATORY_CARE_PROVIDER_SITE_OTHER): Payer: Medicare HMO

## 2017-09-21 VITALS — BP 128/62 | HR 67 | Temp 98.1°F | Ht 62.0 in | Wt 139.0 lb

## 2017-09-21 DIAGNOSIS — N185 Chronic kidney disease, stage 5: Secondary | ICD-10-CM

## 2017-09-21 DIAGNOSIS — K760 Fatty (change of) liver, not elsewhere classified: Secondary | ICD-10-CM

## 2017-09-21 DIAGNOSIS — E785 Hyperlipidemia, unspecified: Secondary | ICD-10-CM

## 2017-09-21 DIAGNOSIS — I10 Essential (primary) hypertension: Secondary | ICD-10-CM | POA: Diagnosis not present

## 2017-09-21 DIAGNOSIS — R634 Abnormal weight loss: Secondary | ICD-10-CM | POA: Diagnosis not present

## 2017-09-21 DIAGNOSIS — Z794 Long term (current) use of insulin: Secondary | ICD-10-CM

## 2017-09-21 DIAGNOSIS — K7460 Unspecified cirrhosis of liver: Secondary | ICD-10-CM | POA: Insufficient documentation

## 2017-09-21 DIAGNOSIS — I2510 Atherosclerotic heart disease of native coronary artery without angina pectoris: Secondary | ICD-10-CM | POA: Diagnosis not present

## 2017-09-21 DIAGNOSIS — E118 Type 2 diabetes mellitus with unspecified complications: Secondary | ICD-10-CM

## 2017-09-21 DIAGNOSIS — N186 End stage renal disease: Secondary | ICD-10-CM | POA: Diagnosis not present

## 2017-09-21 LAB — BASIC METABOLIC PANEL
BUN: 66 mg/dL — AB (ref 6–23)
CHLORIDE: 98 meq/L (ref 96–112)
CO2: 27 mEq/L (ref 19–32)
Calcium: 8.5 mg/dL (ref 8.4–10.5)
Creatinine, Ser: 13.49 mg/dL (ref 0.40–1.20)
GFR: 3.57 mL/min — CL (ref >60.00)
GLUCOSE: 279 mg/dL — AB (ref 70–99)
POTASSIUM: 4.9 meq/L (ref 3.5–5.1)
Sodium: 141 mEq/L (ref 135–145)

## 2017-09-21 LAB — HEPATIC FUNCTION PANEL
ALT: 6 U/L (ref 0–35)
AST: 21 U/L (ref 0–37)
Albumin: 3.2 g/dL — ABNORMAL LOW (ref 3.5–5.2)
Alkaline Phosphatase: 133 U/L — ABNORMAL HIGH (ref 39–117)
BILIRUBIN TOTAL: 0.3 mg/dL (ref 0.2–1.2)
Bilirubin, Direct: 0.1 mg/dL (ref 0.0–0.3)
Total Protein: 6.1 g/dL (ref 6.0–8.3)

## 2017-09-21 LAB — HEMOGLOBIN A1C: Hgb A1c MFr Bld: 7.1 % — ABNORMAL HIGH (ref 4.6–6.5)

## 2017-09-21 LAB — LIPID PANEL
CHOL/HDL RATIO: 3
Cholesterol: 161 mg/dL (ref 0–200)
HDL: 63.6 mg/dL (ref >39.00)
LDL Cholesterol: 83 mg/dL (ref 0–99)
NONHDL: 97.3
Triglycerides: 72 mg/dL (ref 0.0–149.0)
VLDL: 14.4 mg/dL (ref 0.0–40.0)

## 2017-09-21 LAB — TSH: TSH: 2.81 u[IU]/mL (ref 0.35–4.50)

## 2017-09-21 MED ORDER — LABETALOL HCL 200 MG PO TABS: 200.0000 mg | ORAL_TABLET | Freq: Three times a day (TID) | ORAL | 0 refills | Status: DC

## 2017-09-21 NOTE — Assessment & Plan Note (Signed)
Considering a kidney transplant 2019 s/p PD catheter - on PD

## 2017-09-21 NOTE — Assessment & Plan Note (Signed)
BP Readings from Last 3 Encounters:  09/21/17 128/62  08/27/17 138/66  03/28/17 (!) 142/60  Labetalol dose was reduced

## 2017-09-21 NOTE — Telephone Encounter (Signed)
CREATININE 18.37 AND GFR 3.61

## 2017-09-21 NOTE — Assessment & Plan Note (Signed)
S/p CABG 

## 2017-09-21 NOTE — Assessment & Plan Note (Signed)
Wt Readings from Last 3 Encounters:  09/21/17 139 lb (63 kg)  08/27/17 142 lb (64.4 kg)  03/28/17 133 lb (60.3 kg)

## 2017-09-21 NOTE — Progress Notes (Signed)
Subjective:  Patient ID: Mary Valencia, female    DOB: 22-Aug-1955  Age: 62 y.o. MRN: [id number]  CC: No chief complaint on file.   HPI FIDELIS LOTH presents for CAD - CABG in 9/18, s/p PD catheter - on PD. C/o "liver issues" that were found while getting a PD cath placed - ?cirrhosis. F/u HTN - low BP. Her dtr died in 10-Sep-2017 from breast cancer from breast ca. b A complex case  Outpatient Medications Prior to Visit  Medication Sig Dispense Refill  . ACCU-CHEK FASTCLIX LANCETS MISC Use to check blood glucose TID (DX: E11.8, Z79.4) 300 each 3  . Alcohol Swabs (B-D SINGLE USE SWABS REGULAR) PADS Use as directed 100 each 3  . Blood Glucose Calibration (ACCU-CHEK SMARTVIEW CONTROL) LIQD Use as directed 1 each 1  . Blood Glucose Monitoring Suppl (ACCU-CHEK NANO SMARTVIEW) w/Device KIT Use to check blood glucose TID (DX: E11.8, Z79.4) 1 kit 0  . calcium carbonate (TUMS - DOSED IN MG ELEMENTAL CALCIUM) 500 MG chewable tablet     . CALCIUM-MAGNESIUM-VITAMIN D PO Take 1 tablet by mouth daily.    . diphenhydrAMINE (BENADRYL) 25 mg capsule Take 25 mg by mouth as needed.    Marland Kitchen glucose blood (ACCU-CHEK SMARTVIEW) test strip Use to check blood glucose TID (DX: E11.8, Z79.4) 300 each 3  . hydrALAZINE (APRESOLINE) 100 MG tablet Take 100 mg by mouth 3 (three) times daily.    . insulin NPH-regular Human (NOVOLIN 70/30) (70-30) 100 UNIT/ML injection Inject 10 Units into the skin 2 (two) times daily.    Marland Kitchen losartan (COZAAR) 100 MG tablet Take 100 mg by mouth daily.    Marland Kitchen NIFEdipine (PROCARDIA XL/ADALAT-CC) 60 MG 24 hr tablet     . omeprazole (PRILOSEC) 40 MG capsule Take 40 mg by mouth daily.    . polyethylene glycol (MIRALAX / GLYCOLAX) packet Take 17 g by mouth.    . senna-docusate (SENOKOT-S) 8.6-50 MG tablet Take by mouth 2 (two) times daily. Take 2 tab twice daily.    Marland Kitchen torsemide (DEMADEX) 20 MG tablet Take 40 mg by mouth 2 (two) times daily.     Marland Kitchen labetalol (NORMODYNE) 200 MG tablet Take 600 mg  by mouth 3 (three) times daily.     . rosuvastatin (CRESTOR) 20 MG tablet Take 20 mg by mouth daily.     No facility-administered medications prior to visit.     ROS Review of Systems  Constitutional: Positive for fatigue. Negative for activity change, appetite change, chills and unexpected weight change.  HENT: Negative for congestion, mouth sores and sinus pressure.   Eyes: Negative for visual disturbance.  Respiratory: Negative for cough and chest tightness.   Gastrointestinal: Negative for abdominal pain and nausea.  Genitourinary: Negative for difficulty urinating, frequency and vaginal pain.  Musculoskeletal: Positive for arthralgias and gait problem. Negative for back pain.  Skin: Negative for pallor and rash.  Neurological: Negative for dizziness, tremors, weakness, numbness and headaches.  Psychiatric/Behavioral: Positive for dysphoric mood. Negative for confusion, sleep disturbance and suicidal ideas.    Objective:  BP 128/62 (BP Location: Left Arm, Patient Position: Sitting, Cuff Size: Normal)   Pulse 67   Temp 98.1 F (36.7 C) (Oral)   Ht _0  (1.575 m)   Wt 139 lb (63 kg)   SpO2 98%   BMI 25.42 kg/m   BP Readings from Last 3 Encounters:  09/21/17 128/62  08/27/17 138/66  03/28/17 (!) 142/60    Wt Readings from  Last 3 Encounters:  09/21/17 139 lb (63 kg)  08/27/17 142 lb (64.4 kg)  03/28/17 133 lb (60.3 kg)    Physical Exam  Constitutional: She appears well-developed. No distress.  HENT:  Head: Normocephalic.  Right Ear: External ear normal.  Left Ear: External ear normal.  Nose: Nose normal.  Mouth/Throat: Oropharynx is clear and moist.  Eyes: Pupils are equal, round, and reactive to light. Conjunctivae are normal. Right eye exhibits no discharge. Left eye exhibits no discharge.  Neck: Normal range of motion. Neck supple. No JVD present. No tracheal deviation present. No thyromegaly present.  Cardiovascular: Normal rate, regular rhythm and normal  heart sounds.  Pulmonary/Chest: No stridor. No respiratory distress. She has no wheezes.  Abdominal: Soft. Bowel sounds are normal. She exhibits no distension and no mass. There is no tenderness. There is no rebound and no guarding.  Musculoskeletal: She exhibits tenderness. She exhibits no edema.  Lymphadenopathy:    She has no cervical adenopathy.  Neurological: She displays normal reflexes. No cranial nerve deficit. She exhibits normal muscle tone. Coordination abnormal.  Skin: No rash noted. No erythema.  Psychiatric: She has a normal mood and affect. Her behavior is normal. Judgment and thought content normal.  PD catheter  Lab Results  Component Value Date   WBC 9.3 03/29/2017   HGB 7.4 (A) 03/29/2017   HCT 23 (A) 03/29/2017   PLT 318 03/29/2017   GLUCOSE 245 (H) 12/03/2013   CHOL 161 03/29/2017   TRIG 39 (A) 03/29/2017   HDL 65 03/29/2017   LDLDIRECT 207.8 09/29/2010   LDLCALC 74 03/29/2017   ALT 11 05/03/2017   AST 19 05/03/2017   NA 139 05/03/2017   K 4.3 05/03/2017   CL 105 12/03/2013   CREATININE 7.3 (A) 05/03/2017   BUN 70 (A) 05/03/2017   CO2 29 12/03/2013   TSH 1.743 01/29/2012   HGBA1C 8.0 03/29/2017    [place] Lt Lower Extrem Ltd Soft Tissue Non Vascular  Result Date: 08/30/2017 CLINICAL DATA:  Ganglion cyst. Lump on the LEFT LATERAL LOWER leg for 7 weeks. Masses occasionally tender and varies in size. EXAM: ULTRASOUND LEFT LOWER EXTREMITY LIMITED TECHNIQUE: Ultrasound examination of the lower extremity soft tissues was performed in the area of clinical concern. COMPARISON:  None. FINDINGS: Soft Tissue Structures: There is mild edema within the subcutaneous tissue in the area of patient's concern. No mass or fluid collection. No cystic abnormality. IMPRESSION: Mild soft tissue edema.  No mass. Electronically Signed   By: Nolon Nations M.D.   On: 08/30/2017 17:03    Assessment & Plan:   There are no diagnoses linked to this encounter. I am having Ursala M.  Milby maintain her labetalol, hydrALAZINE, CALCIUM-MAGNESIUM-VITAMIN D PO, insulin NPH-regular Human, diphenhydrAMINE, losartan, torsemide, omeprazole, rosuvastatin, senna-docusate, B-D SINGLE USE SWABS REGULAR, ACCU-CHEK SMARTVIEW CONTROL, ACCU-CHEK NANO SMARTVIEW, glucose blood, ACCU-CHEK FASTCLIX LANCETS, polyethylene glycol, NIFEdipine, and calcium carbonate.  No orders of the defined types were placed in this encounter.    Follow-up: No follow-ups on file.  Walker Kehr, MD

## 2017-09-21 NOTE — Assessment & Plan Note (Addendum)
liver issues were found while getting a PD cath placed - ?cirrhosis. Labs, AFP Liver MRI was done in 2018 as a part for a renal transplant work up per pt GI ref in [place] - pt will call me with the name

## 2017-09-21 NOTE — Telephone Encounter (Signed)
Contacted pt and she stated that she is on home dialysis and that she is always thirsty but is under fluid restrictions. Pt also states that she has only one kidney and the other is in ESRD

## 2017-09-21 NOTE — Telephone Encounter (Signed)
Call and ask if she is having any kind of dehydration or GI illness. Her kidney function is decreased from before. If any dehydration advise ER. If not needs repeat BMP next week mon/tues order placed.

## 2017-09-21 NOTE — Assessment & Plan Note (Signed)
On Insulin 70/30

## 2017-09-22 DIAGNOSIS — N186 End stage renal disease: Secondary | ICD-10-CM | POA: Diagnosis not present

## 2017-09-23 DIAGNOSIS — Z992 Dependence on renal dialysis: Secondary | ICD-10-CM | POA: Diagnosis not present

## 2017-09-23 DIAGNOSIS — N186 End stage renal disease: Secondary | ICD-10-CM | POA: Diagnosis not present

## 2017-09-24 DIAGNOSIS — E1142 Type 2 diabetes mellitus with diabetic polyneuropathy: Secondary | ICD-10-CM | POA: Diagnosis not present

## 2017-09-24 DIAGNOSIS — Z794 Long term (current) use of insulin: Secondary | ICD-10-CM | POA: Diagnosis not present

## 2017-09-24 DIAGNOSIS — N186 End stage renal disease: Secondary | ICD-10-CM | POA: Diagnosis not present

## 2017-09-24 DIAGNOSIS — N2581 Secondary hyperparathyroidism of renal origin: Secondary | ICD-10-CM | POA: Diagnosis not present

## 2017-09-24 DIAGNOSIS — I120 Hypertensive chronic kidney disease with stage 5 chronic kidney disease or end stage renal disease: Secondary | ICD-10-CM | POA: Diagnosis not present

## 2017-09-24 DIAGNOSIS — D631 Anemia in chronic kidney disease: Secondary | ICD-10-CM | POA: Diagnosis not present

## 2017-09-24 DIAGNOSIS — Z992 Dependence on renal dialysis: Secondary | ICD-10-CM | POA: Diagnosis not present

## 2017-09-24 DIAGNOSIS — Z01818 Encounter for other preprocedural examination: Secondary | ICD-10-CM | POA: Diagnosis not present

## 2017-09-24 DIAGNOSIS — E1122 Type 2 diabetes mellitus with diabetic chronic kidney disease: Secondary | ICD-10-CM | POA: Diagnosis not present

## 2017-09-24 DIAGNOSIS — E11319 Type 2 diabetes mellitus with unspecified diabetic retinopathy without macular edema: Secondary | ICD-10-CM | POA: Diagnosis not present

## 2017-09-24 DIAGNOSIS — Z79899 Other long term (current) drug therapy: Secondary | ICD-10-CM | POA: Diagnosis not present

## 2017-09-24 DIAGNOSIS — D509 Iron deficiency anemia, unspecified: Secondary | ICD-10-CM | POA: Diagnosis not present

## 2017-09-24 LAB — HEPATITIS B SURFACE ANTIGEN: Hepatitis B Surface Ag: NONREACTIVE

## 2017-09-24 LAB — AFP TUMOR MARKER: AFP-Tumor Marker: 12.7 ng/mL — ABNORMAL HIGH

## 2017-09-24 LAB — HEPATITIS C ANTIBODY
Hepatitis C Ab: NONREACTIVE
SIGNAL TO CUT-OFF: 0.02 (ref <1.00)

## 2017-09-25 DIAGNOSIS — N2581 Secondary hyperparathyroidism of renal origin: Secondary | ICD-10-CM | POA: Diagnosis not present

## 2017-09-25 DIAGNOSIS — N186 End stage renal disease: Secondary | ICD-10-CM | POA: Diagnosis not present

## 2017-09-25 DIAGNOSIS — D509 Iron deficiency anemia, unspecified: Secondary | ICD-10-CM | POA: Diagnosis not present

## 2017-09-25 DIAGNOSIS — D631 Anemia in chronic kidney disease: Secondary | ICD-10-CM | POA: Diagnosis not present

## 2017-09-26 DIAGNOSIS — D509 Iron deficiency anemia, unspecified: Secondary | ICD-10-CM | POA: Diagnosis not present

## 2017-09-26 DIAGNOSIS — D631 Anemia in chronic kidney disease: Secondary | ICD-10-CM | POA: Diagnosis not present

## 2017-09-26 DIAGNOSIS — N186 End stage renal disease: Secondary | ICD-10-CM | POA: Diagnosis not present

## 2017-09-26 DIAGNOSIS — N2581 Secondary hyperparathyroidism of renal origin: Secondary | ICD-10-CM | POA: Diagnosis not present

## 2017-09-27 DIAGNOSIS — D631 Anemia in chronic kidney disease: Secondary | ICD-10-CM | POA: Diagnosis not present

## 2017-09-27 DIAGNOSIS — N2581 Secondary hyperparathyroidism of renal origin: Secondary | ICD-10-CM | POA: Diagnosis not present

## 2017-09-27 DIAGNOSIS — D509 Iron deficiency anemia, unspecified: Secondary | ICD-10-CM | POA: Diagnosis not present

## 2017-09-27 DIAGNOSIS — N186 End stage renal disease: Secondary | ICD-10-CM | POA: Diagnosis not present

## 2017-09-28 DIAGNOSIS — D631 Anemia in chronic kidney disease: Secondary | ICD-10-CM | POA: Diagnosis not present

## 2017-09-28 DIAGNOSIS — N186 End stage renal disease: Secondary | ICD-10-CM | POA: Diagnosis not present

## 2017-09-28 DIAGNOSIS — N2581 Secondary hyperparathyroidism of renal origin: Secondary | ICD-10-CM | POA: Diagnosis not present

## 2017-09-28 DIAGNOSIS — D509 Iron deficiency anemia, unspecified: Secondary | ICD-10-CM | POA: Diagnosis not present

## 2017-09-29 DIAGNOSIS — N2581 Secondary hyperparathyroidism of renal origin: Secondary | ICD-10-CM | POA: Diagnosis not present

## 2017-09-29 DIAGNOSIS — D509 Iron deficiency anemia, unspecified: Secondary | ICD-10-CM | POA: Diagnosis not present

## 2017-09-29 DIAGNOSIS — D631 Anemia in chronic kidney disease: Secondary | ICD-10-CM | POA: Diagnosis not present

## 2017-09-29 DIAGNOSIS — N186 End stage renal disease: Secondary | ICD-10-CM | POA: Diagnosis not present

## 2017-09-30 DIAGNOSIS — D631 Anemia in chronic kidney disease: Secondary | ICD-10-CM | POA: Diagnosis not present

## 2017-09-30 DIAGNOSIS — N186 End stage renal disease: Secondary | ICD-10-CM | POA: Diagnosis not present

## 2017-09-30 DIAGNOSIS — N2581 Secondary hyperparathyroidism of renal origin: Secondary | ICD-10-CM | POA: Diagnosis not present

## 2017-09-30 DIAGNOSIS — D509 Iron deficiency anemia, unspecified: Secondary | ICD-10-CM | POA: Diagnosis not present

## 2017-10-01 DIAGNOSIS — N186 End stage renal disease: Secondary | ICD-10-CM | POA: Diagnosis not present

## 2017-10-01 DIAGNOSIS — E119 Type 2 diabetes mellitus without complications: Secondary | ICD-10-CM | POA: Diagnosis not present

## 2017-10-02 DIAGNOSIS — N186 End stage renal disease: Secondary | ICD-10-CM | POA: Diagnosis not present

## 2017-10-02 DIAGNOSIS — D509 Iron deficiency anemia, unspecified: Secondary | ICD-10-CM | POA: Diagnosis not present

## 2017-10-02 DIAGNOSIS — N2581 Secondary hyperparathyroidism of renal origin: Secondary | ICD-10-CM | POA: Diagnosis not present

## 2017-10-02 DIAGNOSIS — D631 Anemia in chronic kidney disease: Secondary | ICD-10-CM | POA: Diagnosis not present

## 2017-10-03 DIAGNOSIS — D631 Anemia in chronic kidney disease: Secondary | ICD-10-CM | POA: Diagnosis not present

## 2017-10-03 DIAGNOSIS — N2581 Secondary hyperparathyroidism of renal origin: Secondary | ICD-10-CM | POA: Diagnosis not present

## 2017-10-03 DIAGNOSIS — D509 Iron deficiency anemia, unspecified: Secondary | ICD-10-CM | POA: Diagnosis not present

## 2017-10-03 DIAGNOSIS — N186 End stage renal disease: Secondary | ICD-10-CM | POA: Diagnosis not present

## 2017-10-03 DIAGNOSIS — H524 Presbyopia: Secondary | ICD-10-CM | POA: Diagnosis not present

## 2017-10-04 ENCOUNTER — Telehealth: Payer: Self-pay

## 2017-10-04 DIAGNOSIS — N186 End stage renal disease: Secondary | ICD-10-CM | POA: Diagnosis not present

## 2017-10-04 DIAGNOSIS — K7460 Unspecified cirrhosis of liver: Secondary | ICD-10-CM

## 2017-10-04 NOTE — Telephone Encounter (Signed)
Referral made  Copied from [place] [id number]. Topic: Referral - Request >> Sep 25, 2017  8:45 AM Synthia Innocent wrote: Reason for CRM: Requesting referral to liver specialists, Dr Franki Cabot Ph# [phone]  >> Sep 25, 2017  9:30 AM Para Skeans A wrote: Patient was seen for this on 3/29. Can a referral be placed?

## 2017-10-05 DIAGNOSIS — N186 End stage renal disease: Secondary | ICD-10-CM | POA: Diagnosis not present

## 2017-10-06 DIAGNOSIS — N186 End stage renal disease: Secondary | ICD-10-CM | POA: Diagnosis not present

## 2017-10-07 DIAGNOSIS — N186 End stage renal disease: Secondary | ICD-10-CM | POA: Diagnosis not present

## 2017-10-08 DIAGNOSIS — N186 End stage renal disease: Secondary | ICD-10-CM | POA: Diagnosis not present

## 2017-10-09 DIAGNOSIS — N186 End stage renal disease: Secondary | ICD-10-CM | POA: Diagnosis not present

## 2017-10-10 DIAGNOSIS — N186 End stage renal disease: Secondary | ICD-10-CM | POA: Diagnosis not present

## 2017-10-11 DIAGNOSIS — N186 End stage renal disease: Secondary | ICD-10-CM | POA: Diagnosis not present

## 2017-10-12 DIAGNOSIS — N186 End stage renal disease: Secondary | ICD-10-CM | POA: Diagnosis not present

## 2017-10-13 DIAGNOSIS — N186 End stage renal disease: Secondary | ICD-10-CM | POA: Diagnosis not present

## 2017-10-14 DIAGNOSIS — N186 End stage renal disease: Secondary | ICD-10-CM | POA: Diagnosis not present

## 2017-10-15 DIAGNOSIS — N186 End stage renal disease: Secondary | ICD-10-CM | POA: Diagnosis not present

## 2017-10-16 DIAGNOSIS — N186 End stage renal disease: Secondary | ICD-10-CM | POA: Diagnosis not present

## 2017-10-17 DIAGNOSIS — N186 End stage renal disease: Secondary | ICD-10-CM | POA: Diagnosis not present

## 2017-10-18 DIAGNOSIS — N186 End stage renal disease: Secondary | ICD-10-CM | POA: Diagnosis not present

## 2017-10-19 DIAGNOSIS — N186 End stage renal disease: Secondary | ICD-10-CM | POA: Diagnosis not present

## 2017-10-20 DIAGNOSIS — N186 End stage renal disease: Secondary | ICD-10-CM | POA: Diagnosis not present

## 2017-10-21 DIAGNOSIS — N186 End stage renal disease: Secondary | ICD-10-CM | POA: Diagnosis not present

## 2017-10-22 ENCOUNTER — Ambulatory Visit: Payer: Medicare HMO | Admitting: Internal Medicine

## 2017-10-22 DIAGNOSIS — Z0289 Encounter for other administrative examinations: Secondary | ICD-10-CM

## 2017-10-22 DIAGNOSIS — N186 End stage renal disease: Secondary | ICD-10-CM | POA: Diagnosis not present

## 2017-10-23 ENCOUNTER — Encounter: Payer: Self-pay | Admitting: Internal Medicine

## 2017-10-23 DIAGNOSIS — Z992 Dependence on renal dialysis: Secondary | ICD-10-CM | POA: Diagnosis not present

## 2017-10-23 DIAGNOSIS — N186 End stage renal disease: Secondary | ICD-10-CM | POA: Diagnosis not present

## 2017-10-23 LAB — CBC WITH DIFFERENTIAL
ALK PHOS: 164
ALT: 26
AST: 58
Albumin Serum: 3.4
Albumin/Globulin Ratio: 1.3
CA PHOSPHATE CRYSTAL: 63.1
CO2: 25
Calcium: 25
Ferritin: 1279
HGB: 29.1
IRON SATURATION: 56
IRON: 150
LDH: 260
MCH: 30.2
MCHC: 32.3
MCV: 93
PHOSPHORUS: 7.6
PTH: 597
RBC: 3.23 — AB (ref 4.00–5.20)
RDW: 13.1
TIBC: 269
TPMT: 6.1
TRANSFERRIN: 208
UIBC: 119

## 2017-10-23 NOTE — Progress Notes (Signed)
Outside notes received. Information abstracted. Notes sent to scan.  

## 2017-10-24 DIAGNOSIS — D631 Anemia in chronic kidney disease: Secondary | ICD-10-CM | POA: Diagnosis not present

## 2017-10-24 DIAGNOSIS — D509 Iron deficiency anemia, unspecified: Secondary | ICD-10-CM | POA: Diagnosis not present

## 2017-10-24 DIAGNOSIS — N186 End stage renal disease: Secondary | ICD-10-CM | POA: Diagnosis not present

## 2017-10-24 DIAGNOSIS — N2581 Secondary hyperparathyroidism of renal origin: Secondary | ICD-10-CM | POA: Diagnosis not present

## 2017-10-25 DIAGNOSIS — N186 End stage renal disease: Secondary | ICD-10-CM | POA: Diagnosis not present

## 2017-10-25 DIAGNOSIS — N2581 Secondary hyperparathyroidism of renal origin: Secondary | ICD-10-CM | POA: Diagnosis not present

## 2017-10-25 DIAGNOSIS — D509 Iron deficiency anemia, unspecified: Secondary | ICD-10-CM | POA: Diagnosis not present

## 2017-10-25 DIAGNOSIS — D631 Anemia in chronic kidney disease: Secondary | ICD-10-CM | POA: Diagnosis not present

## 2017-10-26 DIAGNOSIS — N186 End stage renal disease: Secondary | ICD-10-CM | POA: Diagnosis not present

## 2017-10-26 DIAGNOSIS — D631 Anemia in chronic kidney disease: Secondary | ICD-10-CM | POA: Diagnosis not present

## 2017-10-26 DIAGNOSIS — D509 Iron deficiency anemia, unspecified: Secondary | ICD-10-CM | POA: Diagnosis not present

## 2017-10-26 DIAGNOSIS — N2581 Secondary hyperparathyroidism of renal origin: Secondary | ICD-10-CM | POA: Diagnosis not present

## 2017-10-27 DIAGNOSIS — N186 End stage renal disease: Secondary | ICD-10-CM | POA: Diagnosis not present

## 2017-10-27 DIAGNOSIS — D509 Iron deficiency anemia, unspecified: Secondary | ICD-10-CM | POA: Diagnosis not present

## 2017-10-27 DIAGNOSIS — N2581 Secondary hyperparathyroidism of renal origin: Secondary | ICD-10-CM | POA: Diagnosis not present

## 2017-10-27 DIAGNOSIS — D631 Anemia in chronic kidney disease: Secondary | ICD-10-CM | POA: Diagnosis not present

## 2017-10-28 DIAGNOSIS — N186 End stage renal disease: Secondary | ICD-10-CM | POA: Diagnosis not present

## 2017-10-28 DIAGNOSIS — D631 Anemia in chronic kidney disease: Secondary | ICD-10-CM | POA: Diagnosis not present

## 2017-10-28 DIAGNOSIS — D509 Iron deficiency anemia, unspecified: Secondary | ICD-10-CM | POA: Diagnosis not present

## 2017-10-28 DIAGNOSIS — N2581 Secondary hyperparathyroidism of renal origin: Secondary | ICD-10-CM | POA: Diagnosis not present

## 2017-10-29 DIAGNOSIS — D631 Anemia in chronic kidney disease: Secondary | ICD-10-CM | POA: Diagnosis not present

## 2017-10-29 DIAGNOSIS — N186 End stage renal disease: Secondary | ICD-10-CM | POA: Diagnosis not present

## 2017-10-29 DIAGNOSIS — D509 Iron deficiency anemia, unspecified: Secondary | ICD-10-CM | POA: Diagnosis not present

## 2017-10-29 DIAGNOSIS — N2581 Secondary hyperparathyroidism of renal origin: Secondary | ICD-10-CM | POA: Diagnosis not present

## 2017-10-30 DIAGNOSIS — N2581 Secondary hyperparathyroidism of renal origin: Secondary | ICD-10-CM | POA: Diagnosis not present

## 2017-10-30 DIAGNOSIS — E119 Type 2 diabetes mellitus without complications: Secondary | ICD-10-CM | POA: Diagnosis not present

## 2017-10-30 DIAGNOSIS — D509 Iron deficiency anemia, unspecified: Secondary | ICD-10-CM | POA: Diagnosis not present

## 2017-10-30 DIAGNOSIS — D631 Anemia in chronic kidney disease: Secondary | ICD-10-CM | POA: Diagnosis not present

## 2017-10-30 DIAGNOSIS — N186 End stage renal disease: Secondary | ICD-10-CM | POA: Diagnosis not present

## 2017-10-31 DIAGNOSIS — N2581 Secondary hyperparathyroidism of renal origin: Secondary | ICD-10-CM | POA: Diagnosis not present

## 2017-10-31 DIAGNOSIS — N186 End stage renal disease: Secondary | ICD-10-CM | POA: Diagnosis not present

## 2017-10-31 DIAGNOSIS — D509 Iron deficiency anemia, unspecified: Secondary | ICD-10-CM | POA: Diagnosis not present

## 2017-10-31 DIAGNOSIS — D631 Anemia in chronic kidney disease: Secondary | ICD-10-CM | POA: Diagnosis not present

## 2017-11-01 DIAGNOSIS — N186 End stage renal disease: Secondary | ICD-10-CM | POA: Diagnosis not present

## 2017-11-01 DIAGNOSIS — N2581 Secondary hyperparathyroidism of renal origin: Secondary | ICD-10-CM | POA: Diagnosis not present

## 2017-11-01 DIAGNOSIS — D509 Iron deficiency anemia, unspecified: Secondary | ICD-10-CM | POA: Diagnosis not present

## 2017-11-01 DIAGNOSIS — D631 Anemia in chronic kidney disease: Secondary | ICD-10-CM | POA: Diagnosis not present

## 2017-11-02 DIAGNOSIS — D509 Iron deficiency anemia, unspecified: Secondary | ICD-10-CM | POA: Diagnosis not present

## 2017-11-02 DIAGNOSIS — N2581 Secondary hyperparathyroidism of renal origin: Secondary | ICD-10-CM | POA: Diagnosis not present

## 2017-11-02 DIAGNOSIS — D631 Anemia in chronic kidney disease: Secondary | ICD-10-CM | POA: Diagnosis not present

## 2017-11-02 DIAGNOSIS — N186 End stage renal disease: Secondary | ICD-10-CM | POA: Diagnosis not present

## 2017-11-03 DIAGNOSIS — D509 Iron deficiency anemia, unspecified: Secondary | ICD-10-CM | POA: Diagnosis not present

## 2017-11-03 DIAGNOSIS — N2581 Secondary hyperparathyroidism of renal origin: Secondary | ICD-10-CM | POA: Diagnosis not present

## 2017-11-03 DIAGNOSIS — D631 Anemia in chronic kidney disease: Secondary | ICD-10-CM | POA: Diagnosis not present

## 2017-11-03 DIAGNOSIS — N186 End stage renal disease: Secondary | ICD-10-CM | POA: Diagnosis not present

## 2017-11-04 DIAGNOSIS — D509 Iron deficiency anemia, unspecified: Secondary | ICD-10-CM | POA: Diagnosis not present

## 2017-11-04 DIAGNOSIS — D631 Anemia in chronic kidney disease: Secondary | ICD-10-CM | POA: Diagnosis not present

## 2017-11-04 DIAGNOSIS — N186 End stage renal disease: Secondary | ICD-10-CM | POA: Diagnosis not present

## 2017-11-04 DIAGNOSIS — N2581 Secondary hyperparathyroidism of renal origin: Secondary | ICD-10-CM | POA: Diagnosis not present

## 2017-11-05 DIAGNOSIS — D509 Iron deficiency anemia, unspecified: Secondary | ICD-10-CM | POA: Diagnosis not present

## 2017-11-05 DIAGNOSIS — N2581 Secondary hyperparathyroidism of renal origin: Secondary | ICD-10-CM | POA: Diagnosis not present

## 2017-11-05 DIAGNOSIS — N186 End stage renal disease: Secondary | ICD-10-CM | POA: Diagnosis not present

## 2017-11-05 DIAGNOSIS — D631 Anemia in chronic kidney disease: Secondary | ICD-10-CM | POA: Diagnosis not present

## 2017-11-06 DIAGNOSIS — N186 End stage renal disease: Secondary | ICD-10-CM | POA: Diagnosis not present

## 2017-11-06 DIAGNOSIS — D631 Anemia in chronic kidney disease: Secondary | ICD-10-CM | POA: Diagnosis not present

## 2017-11-06 DIAGNOSIS — Z951 Presence of aortocoronary bypass graft: Secondary | ICD-10-CM | POA: Diagnosis not present

## 2017-11-06 DIAGNOSIS — D509 Iron deficiency anemia, unspecified: Secondary | ICD-10-CM | POA: Diagnosis not present

## 2017-11-06 DIAGNOSIS — I2510 Atherosclerotic heart disease of native coronary artery without angina pectoris: Secondary | ICD-10-CM | POA: Diagnosis not present

## 2017-11-06 DIAGNOSIS — N2581 Secondary hyperparathyroidism of renal origin: Secondary | ICD-10-CM | POA: Diagnosis not present

## 2017-11-06 DIAGNOSIS — Z79899 Other long term (current) drug therapy: Secondary | ICD-10-CM | POA: Diagnosis not present

## 2017-11-07 DIAGNOSIS — D631 Anemia in chronic kidney disease: Secondary | ICD-10-CM | POA: Diagnosis not present

## 2017-11-07 DIAGNOSIS — N186 End stage renal disease: Secondary | ICD-10-CM | POA: Diagnosis not present

## 2017-11-07 DIAGNOSIS — D509 Iron deficiency anemia, unspecified: Secondary | ICD-10-CM | POA: Diagnosis not present

## 2017-11-07 DIAGNOSIS — N2581 Secondary hyperparathyroidism of renal origin: Secondary | ICD-10-CM | POA: Diagnosis not present

## 2017-11-08 DIAGNOSIS — N186 End stage renal disease: Secondary | ICD-10-CM | POA: Diagnosis not present

## 2017-11-08 DIAGNOSIS — N2581 Secondary hyperparathyroidism of renal origin: Secondary | ICD-10-CM | POA: Diagnosis not present

## 2017-11-08 DIAGNOSIS — D509 Iron deficiency anemia, unspecified: Secondary | ICD-10-CM | POA: Diagnosis not present

## 2017-11-08 DIAGNOSIS — D631 Anemia in chronic kidney disease: Secondary | ICD-10-CM | POA: Diagnosis not present

## 2017-11-09 DIAGNOSIS — N186 End stage renal disease: Secondary | ICD-10-CM | POA: Diagnosis not present

## 2017-11-09 DIAGNOSIS — D631 Anemia in chronic kidney disease: Secondary | ICD-10-CM | POA: Diagnosis not present

## 2017-11-09 DIAGNOSIS — N2581 Secondary hyperparathyroidism of renal origin: Secondary | ICD-10-CM | POA: Diagnosis not present

## 2017-11-09 DIAGNOSIS — D509 Iron deficiency anemia, unspecified: Secondary | ICD-10-CM | POA: Diagnosis not present

## 2017-11-10 DIAGNOSIS — D631 Anemia in chronic kidney disease: Secondary | ICD-10-CM | POA: Diagnosis not present

## 2017-11-10 DIAGNOSIS — D509 Iron deficiency anemia, unspecified: Secondary | ICD-10-CM | POA: Diagnosis not present

## 2017-11-10 DIAGNOSIS — N186 End stage renal disease: Secondary | ICD-10-CM | POA: Diagnosis not present

## 2017-11-10 DIAGNOSIS — N2581 Secondary hyperparathyroidism of renal origin: Secondary | ICD-10-CM | POA: Diagnosis not present

## 2017-11-11 DIAGNOSIS — N2581 Secondary hyperparathyroidism of renal origin: Secondary | ICD-10-CM | POA: Diagnosis not present

## 2017-11-11 DIAGNOSIS — D509 Iron deficiency anemia, unspecified: Secondary | ICD-10-CM | POA: Diagnosis not present

## 2017-11-11 DIAGNOSIS — D631 Anemia in chronic kidney disease: Secondary | ICD-10-CM | POA: Diagnosis not present

## 2017-11-11 DIAGNOSIS — N186 End stage renal disease: Secondary | ICD-10-CM | POA: Diagnosis not present

## 2017-11-12 DIAGNOSIS — K769 Liver disease, unspecified: Secondary | ICD-10-CM | POA: Diagnosis not present

## 2017-11-12 DIAGNOSIS — D631 Anemia in chronic kidney disease: Secondary | ICD-10-CM | POA: Diagnosis not present

## 2017-11-12 DIAGNOSIS — Z86010 Personal history of colonic polyps: Secondary | ICD-10-CM | POA: Diagnosis not present

## 2017-11-12 DIAGNOSIS — N2581 Secondary hyperparathyroidism of renal origin: Secondary | ICD-10-CM | POA: Diagnosis not present

## 2017-11-12 DIAGNOSIS — N186 End stage renal disease: Secondary | ICD-10-CM | POA: Diagnosis not present

## 2017-11-12 DIAGNOSIS — K219 Gastro-esophageal reflux disease without esophagitis: Secondary | ICD-10-CM | POA: Diagnosis not present

## 2017-11-12 DIAGNOSIS — D509 Iron deficiency anemia, unspecified: Secondary | ICD-10-CM | POA: Diagnosis not present

## 2017-11-13 DIAGNOSIS — D509 Iron deficiency anemia, unspecified: Secondary | ICD-10-CM | POA: Diagnosis not present

## 2017-11-13 DIAGNOSIS — N186 End stage renal disease: Secondary | ICD-10-CM | POA: Diagnosis not present

## 2017-11-13 DIAGNOSIS — N2581 Secondary hyperparathyroidism of renal origin: Secondary | ICD-10-CM | POA: Diagnosis not present

## 2017-11-13 DIAGNOSIS — D631 Anemia in chronic kidney disease: Secondary | ICD-10-CM | POA: Diagnosis not present

## 2017-11-14 DIAGNOSIS — D631 Anemia in chronic kidney disease: Secondary | ICD-10-CM | POA: Diagnosis not present

## 2017-11-14 DIAGNOSIS — D509 Iron deficiency anemia, unspecified: Secondary | ICD-10-CM | POA: Diagnosis not present

## 2017-11-14 DIAGNOSIS — N186 End stage renal disease: Secondary | ICD-10-CM | POA: Diagnosis not present

## 2017-11-14 DIAGNOSIS — N2581 Secondary hyperparathyroidism of renal origin: Secondary | ICD-10-CM | POA: Diagnosis not present

## 2017-11-15 DIAGNOSIS — D509 Iron deficiency anemia, unspecified: Secondary | ICD-10-CM | POA: Diagnosis not present

## 2017-11-15 DIAGNOSIS — N186 End stage renal disease: Secondary | ICD-10-CM | POA: Diagnosis not present

## 2017-11-15 DIAGNOSIS — N2581 Secondary hyperparathyroidism of renal origin: Secondary | ICD-10-CM | POA: Diagnosis not present

## 2017-11-15 DIAGNOSIS — D631 Anemia in chronic kidney disease: Secondary | ICD-10-CM | POA: Diagnosis not present

## 2017-11-16 DIAGNOSIS — D631 Anemia in chronic kidney disease: Secondary | ICD-10-CM | POA: Diagnosis not present

## 2017-11-16 DIAGNOSIS — N2581 Secondary hyperparathyroidism of renal origin: Secondary | ICD-10-CM | POA: Diagnosis not present

## 2017-11-16 DIAGNOSIS — D509 Iron deficiency anemia, unspecified: Secondary | ICD-10-CM | POA: Diagnosis not present

## 2017-11-16 DIAGNOSIS — N186 End stage renal disease: Secondary | ICD-10-CM | POA: Diagnosis not present

## 2017-11-17 DIAGNOSIS — D631 Anemia in chronic kidney disease: Secondary | ICD-10-CM | POA: Diagnosis not present

## 2017-11-17 DIAGNOSIS — N2581 Secondary hyperparathyroidism of renal origin: Secondary | ICD-10-CM | POA: Diagnosis not present

## 2017-11-17 DIAGNOSIS — N186 End stage renal disease: Secondary | ICD-10-CM | POA: Diagnosis not present

## 2017-11-17 DIAGNOSIS — D509 Iron deficiency anemia, unspecified: Secondary | ICD-10-CM | POA: Diagnosis not present

## 2017-11-18 DIAGNOSIS — N2581 Secondary hyperparathyroidism of renal origin: Secondary | ICD-10-CM | POA: Diagnosis not present

## 2017-11-18 DIAGNOSIS — N186 End stage renal disease: Secondary | ICD-10-CM | POA: Diagnosis not present

## 2017-11-18 DIAGNOSIS — D509 Iron deficiency anemia, unspecified: Secondary | ICD-10-CM | POA: Diagnosis not present

## 2017-11-18 DIAGNOSIS — D631 Anemia in chronic kidney disease: Secondary | ICD-10-CM | POA: Diagnosis not present

## 2017-11-19 DIAGNOSIS — D631 Anemia in chronic kidney disease: Secondary | ICD-10-CM | POA: Diagnosis not present

## 2017-11-19 DIAGNOSIS — N186 End stage renal disease: Secondary | ICD-10-CM | POA: Diagnosis not present

## 2017-11-19 DIAGNOSIS — D509 Iron deficiency anemia, unspecified: Secondary | ICD-10-CM | POA: Diagnosis not present

## 2017-11-19 DIAGNOSIS — N2581 Secondary hyperparathyroidism of renal origin: Secondary | ICD-10-CM | POA: Diagnosis not present

## 2017-11-20 DIAGNOSIS — N2581 Secondary hyperparathyroidism of renal origin: Secondary | ICD-10-CM | POA: Diagnosis not present

## 2017-11-20 DIAGNOSIS — N186 End stage renal disease: Secondary | ICD-10-CM | POA: Diagnosis not present

## 2017-11-20 DIAGNOSIS — D509 Iron deficiency anemia, unspecified: Secondary | ICD-10-CM | POA: Diagnosis not present

## 2017-11-20 DIAGNOSIS — D631 Anemia in chronic kidney disease: Secondary | ICD-10-CM | POA: Diagnosis not present

## 2017-11-21 DIAGNOSIS — D509 Iron deficiency anemia, unspecified: Secondary | ICD-10-CM | POA: Diagnosis not present

## 2017-11-21 DIAGNOSIS — N186 End stage renal disease: Secondary | ICD-10-CM | POA: Diagnosis not present

## 2017-11-21 DIAGNOSIS — N2581 Secondary hyperparathyroidism of renal origin: Secondary | ICD-10-CM | POA: Diagnosis not present

## 2017-11-21 DIAGNOSIS — D631 Anemia in chronic kidney disease: Secondary | ICD-10-CM | POA: Diagnosis not present

## 2017-11-22 DIAGNOSIS — D631 Anemia in chronic kidney disease: Secondary | ICD-10-CM | POA: Diagnosis not present

## 2017-11-22 DIAGNOSIS — D509 Iron deficiency anemia, unspecified: Secondary | ICD-10-CM | POA: Diagnosis not present

## 2017-11-22 DIAGNOSIS — N2581 Secondary hyperparathyroidism of renal origin: Secondary | ICD-10-CM | POA: Diagnosis not present

## 2017-11-22 DIAGNOSIS — N186 End stage renal disease: Secondary | ICD-10-CM | POA: Diagnosis not present

## 2017-11-23 DIAGNOSIS — N2581 Secondary hyperparathyroidism of renal origin: Secondary | ICD-10-CM | POA: Diagnosis not present

## 2017-11-23 DIAGNOSIS — D631 Anemia in chronic kidney disease: Secondary | ICD-10-CM | POA: Diagnosis not present

## 2017-11-23 DIAGNOSIS — D509 Iron deficiency anemia, unspecified: Secondary | ICD-10-CM | POA: Diagnosis not present

## 2017-11-23 DIAGNOSIS — Z992 Dependence on renal dialysis: Secondary | ICD-10-CM | POA: Diagnosis not present

## 2017-11-23 DIAGNOSIS — N186 End stage renal disease: Secondary | ICD-10-CM | POA: Diagnosis not present

## 2017-11-24 DIAGNOSIS — D631 Anemia in chronic kidney disease: Secondary | ICD-10-CM | POA: Diagnosis not present

## 2017-11-24 DIAGNOSIS — D509 Iron deficiency anemia, unspecified: Secondary | ICD-10-CM | POA: Diagnosis not present

## 2017-11-24 DIAGNOSIS — N186 End stage renal disease: Secondary | ICD-10-CM | POA: Diagnosis not present

## 2017-11-24 DIAGNOSIS — N2581 Secondary hyperparathyroidism of renal origin: Secondary | ICD-10-CM | POA: Diagnosis not present

## 2017-11-25 DIAGNOSIS — N186 End stage renal disease: Secondary | ICD-10-CM | POA: Diagnosis not present

## 2017-11-25 DIAGNOSIS — D509 Iron deficiency anemia, unspecified: Secondary | ICD-10-CM | POA: Diagnosis not present

## 2017-11-25 DIAGNOSIS — D631 Anemia in chronic kidney disease: Secondary | ICD-10-CM | POA: Diagnosis not present

## 2017-11-25 DIAGNOSIS — N2581 Secondary hyperparathyroidism of renal origin: Secondary | ICD-10-CM | POA: Diagnosis not present

## 2017-11-26 DIAGNOSIS — N186 End stage renal disease: Secondary | ICD-10-CM | POA: Diagnosis not present

## 2017-11-26 DIAGNOSIS — N2581 Secondary hyperparathyroidism of renal origin: Secondary | ICD-10-CM | POA: Diagnosis not present

## 2017-11-26 DIAGNOSIS — D509 Iron deficiency anemia, unspecified: Secondary | ICD-10-CM | POA: Diagnosis not present

## 2017-11-26 DIAGNOSIS — D631 Anemia in chronic kidney disease: Secondary | ICD-10-CM | POA: Diagnosis not present

## 2017-11-27 DIAGNOSIS — Z4932 Encounter for adequacy testing for peritoneal dialysis: Secondary | ICD-10-CM | POA: Diagnosis not present

## 2017-11-27 DIAGNOSIS — N2581 Secondary hyperparathyroidism of renal origin: Secondary | ICD-10-CM | POA: Diagnosis not present

## 2017-11-27 DIAGNOSIS — D509 Iron deficiency anemia, unspecified: Secondary | ICD-10-CM | POA: Diagnosis not present

## 2017-11-27 DIAGNOSIS — E119 Type 2 diabetes mellitus without complications: Secondary | ICD-10-CM | POA: Diagnosis not present

## 2017-11-27 DIAGNOSIS — N186 End stage renal disease: Secondary | ICD-10-CM | POA: Diagnosis not present

## 2017-11-27 DIAGNOSIS — D631 Anemia in chronic kidney disease: Secondary | ICD-10-CM | POA: Diagnosis not present

## 2017-11-28 DIAGNOSIS — N2581 Secondary hyperparathyroidism of renal origin: Secondary | ICD-10-CM | POA: Diagnosis not present

## 2017-11-28 DIAGNOSIS — D631 Anemia in chronic kidney disease: Secondary | ICD-10-CM | POA: Diagnosis not present

## 2017-11-28 DIAGNOSIS — N186 End stage renal disease: Secondary | ICD-10-CM | POA: Diagnosis not present

## 2017-11-28 DIAGNOSIS — D509 Iron deficiency anemia, unspecified: Secondary | ICD-10-CM | POA: Diagnosis not present

## 2017-11-29 DIAGNOSIS — N2581 Secondary hyperparathyroidism of renal origin: Secondary | ICD-10-CM | POA: Diagnosis not present

## 2017-11-29 DIAGNOSIS — D509 Iron deficiency anemia, unspecified: Secondary | ICD-10-CM | POA: Diagnosis not present

## 2017-11-29 DIAGNOSIS — D631 Anemia in chronic kidney disease: Secondary | ICD-10-CM | POA: Diagnosis not present

## 2017-11-29 DIAGNOSIS — N186 End stage renal disease: Secondary | ICD-10-CM | POA: Diagnosis not present

## 2017-11-30 DIAGNOSIS — D631 Anemia in chronic kidney disease: Secondary | ICD-10-CM | POA: Diagnosis not present

## 2017-11-30 DIAGNOSIS — N2581 Secondary hyperparathyroidism of renal origin: Secondary | ICD-10-CM | POA: Diagnosis not present

## 2017-11-30 DIAGNOSIS — D509 Iron deficiency anemia, unspecified: Secondary | ICD-10-CM | POA: Diagnosis not present

## 2017-11-30 DIAGNOSIS — N186 End stage renal disease: Secondary | ICD-10-CM | POA: Diagnosis not present

## 2017-12-01 DIAGNOSIS — D509 Iron deficiency anemia, unspecified: Secondary | ICD-10-CM | POA: Diagnosis not present

## 2017-12-01 DIAGNOSIS — D631 Anemia in chronic kidney disease: Secondary | ICD-10-CM | POA: Diagnosis not present

## 2017-12-01 DIAGNOSIS — N186 End stage renal disease: Secondary | ICD-10-CM | POA: Diagnosis not present

## 2017-12-01 DIAGNOSIS — N2581 Secondary hyperparathyroidism of renal origin: Secondary | ICD-10-CM | POA: Diagnosis not present

## 2017-12-02 DIAGNOSIS — D509 Iron deficiency anemia, unspecified: Secondary | ICD-10-CM | POA: Diagnosis not present

## 2017-12-02 DIAGNOSIS — D631 Anemia in chronic kidney disease: Secondary | ICD-10-CM | POA: Diagnosis not present

## 2017-12-02 DIAGNOSIS — N186 End stage renal disease: Secondary | ICD-10-CM | POA: Diagnosis not present

## 2017-12-02 DIAGNOSIS — N2581 Secondary hyperparathyroidism of renal origin: Secondary | ICD-10-CM | POA: Diagnosis not present

## 2017-12-03 DIAGNOSIS — D509 Iron deficiency anemia, unspecified: Secondary | ICD-10-CM | POA: Diagnosis not present

## 2017-12-03 DIAGNOSIS — N2581 Secondary hyperparathyroidism of renal origin: Secondary | ICD-10-CM | POA: Diagnosis not present

## 2017-12-03 DIAGNOSIS — N186 End stage renal disease: Secondary | ICD-10-CM | POA: Diagnosis not present

## 2017-12-03 DIAGNOSIS — D631 Anemia in chronic kidney disease: Secondary | ICD-10-CM | POA: Diagnosis not present

## 2017-12-04 DIAGNOSIS — N186 End stage renal disease: Secondary | ICD-10-CM | POA: Diagnosis not present

## 2017-12-05 DIAGNOSIS — N186 End stage renal disease: Secondary | ICD-10-CM | POA: Diagnosis not present

## 2017-12-06 DIAGNOSIS — N186 End stage renal disease: Secondary | ICD-10-CM | POA: Diagnosis not present

## 2017-12-07 DIAGNOSIS — N186 End stage renal disease: Secondary | ICD-10-CM | POA: Diagnosis not present

## 2017-12-08 DIAGNOSIS — N186 End stage renal disease: Secondary | ICD-10-CM | POA: Diagnosis not present

## 2017-12-09 DIAGNOSIS — N186 End stage renal disease: Secondary | ICD-10-CM | POA: Diagnosis not present

## 2017-12-10 DIAGNOSIS — N186 End stage renal disease: Secondary | ICD-10-CM | POA: Diagnosis not present

## 2017-12-11 DIAGNOSIS — N186 End stage renal disease: Secondary | ICD-10-CM | POA: Diagnosis not present

## 2017-12-12 DIAGNOSIS — N186 End stage renal disease: Secondary | ICD-10-CM | POA: Diagnosis not present

## 2017-12-13 DIAGNOSIS — N186 End stage renal disease: Secondary | ICD-10-CM | POA: Diagnosis not present

## 2017-12-14 ENCOUNTER — Ambulatory Visit: Payer: Medicare HMO

## 2017-12-14 DIAGNOSIS — N186 End stage renal disease: Secondary | ICD-10-CM | POA: Diagnosis not present

## 2017-12-14 NOTE — Progress Notes (Deleted)
Subjective:   Mary Valencia is a 62 y.o. female who presents for Medicare Annual (Subsequent) preventive examination.  Review of Systems:  No ROS.  Medicare Wellness Visit. Additional risk factors are reflected in the social history.    Sleep patterns: {SX; SLEEP PATTERNS:18802::"feels rested on waking","does not get up to void","gets up *** times nightly to void","sleeps *** hours nightly"}.    Home Safety/Smoke Alarms: Feels safe in home. Smoke alarms in place.  Living environment; residence and Firearm Safety: {Rehab home environment / accessibility:30080::"no firearms","firearms stored safely"}. Seat Belt Safety/Bike Helmet: Wears seat belt.     Objective:     Vitals: There were no vitals taken for this visit.  There is no height or weight on file to calculate BMI.  Advanced Directives 11/27/2016 01/29/2012  Does Patient Have a Medical Advance Directive? No Patient does not have advance directive  Would patient like information on creating a medical advance directive? No - Patient declined -    Tobacco Social History   Tobacco Use  Smoking Status Never Smoker  Smokeless Tobacco Never Used     Counseling given: Not Answered  Past Medical History:  Diagnosis Date  . Allergic rhinitis   . 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 Edema 10/27/2012   L>R  . GERD (gastroesophageal reflux disease)   . HTN (hypertension)   . Hyperlipidemia   . Type II or unspecified type diabetes mellitus without mention of complication, not stated as uncontrolled    Past Surgical History:  Procedure Laterality Date  . ABDOMINAL HYSTERECTOMY     partial  . KIDNEY SURGERY     right removed  . NEPHRECTOMY     right - related to untreated strep infection and stones  . TONSILLECTOMY     Family History  Problem Relation Age of Onset  . Heart disease Mother 24       MI  . Heart disease Father 39       MI  . Alcohol abuse Sister   . Kidney disease Sister   . Heart disease  Brother        ?CHF ?CAD  . Heart disease Maternal Aunt   . Diabetes Other    Social History   Socioeconomic History  . Marital status: Married    Spouse name: Not on file  . Number of children: Not on file  . Years of education: Not on file  . Highest education level: Not on file  Occupational History  . Not on file  Social Needs  . Financial resource strain: Not on file  . Food insecurity:    Worry: Not on file    Inability: Not on file  . Transportation needs:    Medical: Not on file    Non-medical: Not on file  Tobacco Use  . Smoking status: Never Smoker  . Smokeless tobacco: Never Used  Substance and Sexual Activity  . Alcohol use: No  . Drug use: No  . Sexual activity: Yes  Lifestyle  . Physical activity:    Days per week: Not on file    Minutes per session: Not on file  . Stress: Not on file  Relationships  . Social connections:    Talks on phone: Not on file    Gets together: Not on file    Attends religious service: Not on file    Active member of club or organization: Not on file    Attends meetings of clubs or  organizations: Not on file    Relationship status: Not on file  Other Topics Concern  . 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 ACCU-CHEK FASTCLIX LANCETS MISC Use to check blood glucose TID (DX: E11.8, Z79.4)  . Alcohol Swabs (B-D SINGLE USE SWABS REGULAR) PADS Use as directed  . Blood Glucose Calibration (ACCU-CHEK SMARTVIEW CONTROL) LIQD Use as directed  . Blood Glucose Monitoring Suppl (ACCU-CHEK NANO SMARTVIEW) w/Device KIT Use to check blood glucose TID (DX: E11.8, Z79.4)  . calcium carbonate (TUMS - DOSED IN MG ELEMENTAL CALCIUM) 500 MG chewable tablet   . CALCIUM-MAGNESIUM-VITAMIN D PO Take 1 tablet by mouth daily.  . diphenhydrAMINE (BENADRYL) 25 mg capsule Take 25 mg by mouth as needed.    Marland Kitchen glucose blood (ACCU-CHEK SMARTVIEW) test strip Use to check blood glucose TID (DX: E11.8, Z79.4)  . hydrALAZINE (APRESOLINE) 100 MG tablet Take 100 mg by mouth 3 (three) times daily.  . insulin NPH-regular Human (NOVOLIN 70/30) (70-30) 100 UNIT/ML injection Inject 10 Units into the skin 2 (two) times daily.  Marland Kitchen labetalol (NORMODYNE) 200 MG tablet Take 1 tablet (200 mg total) by mouth 3 (three) times daily.  Marland Kitchen losartan (COZAAR) 100 MG tablet Take 100 mg by mouth daily.  Marland Kitchen NIFEdipine (PROCARDIA XL/ADALAT-CC) 60 MG 24 hr tablet   . omeprazole (PRILOSEC) 40 MG capsule Take 40 mg by mouth daily.  . polyethylene glycol (MIRALAX / GLYCOLAX) packet Take 17 g by mouth.  . rosuvastatin (CRESTOR) 20 MG tablet Take 20 mg by mouth daily.  Marland Kitchen senna-docusate (SENOKOT-S) 8.6-50 MG tablet Take by mouth 2 (two) times daily. Take 2 tab twice daily.  Marland Kitchen torsemide (DEMADEX) 20 MG tablet Take 40 mg by mouth 2 (two) times daily.    No facility-administered encounter medications on file as of 12/14/2017.     Activities of Daily Living No flowsheet data found.  Patient Care Team: Mary Valencia, Mary Lacks, MD as PCP - General Mary Valencia Mary Guest, MD (Orthopedic Surgery) Mary Kerbs, MD as Referring Physician (Neurology) Mary Valencia, Mary Mustard, MD (Obstetrics and Gynecology) Thereasa Distance, MD as Referring Physician (Nephrology) Curly Rim, MD as Referring Physician (Internal Medicine)    Assessment:   This is a routine wellness examination for Mary Valencia. Physical assessment deferred to PCP.   Exercise Activities and Dietary recommendations   Diet (meal preparation, eat out, water intake, caffeinated beverages, dairy products, fruits and vegetables): {Desc; diets:16563}   Goals    None      Fall Risk Fall Risk  03/28/2017 11/27/2016  Falls in the past year? Yes Yes  Number falls in past yr: 1 1  Injury with Fall? No -    Depression Screen PHQ 2/9 Scores 03/28/2017 11/27/2016  PHQ - 2 Score 0 0      Cognitive Function        Immunization History  Administered Date(s) Administered  . Influenza Whole 05/16/2005  . Pneumococcal Polysaccharide-23 10/08/2016  . Td 09/26/2004, 08/25/2011   Screening Tests Health Maintenance  Topic Date Due  . OPHTHALMOLOGY EXAM  06/12/1964  . HIV Screening  06/12/1969  . COLONOSCOPY  06/12/2004  . FOOT EXAM  05/06/2014  . INFLUENZA VACCINE  01/24/2018  . HEMOGLOBIN A1C  03/24/2018  . MAMMOGRAM  10/10/2018  . TETANUS/TDAP  08/24/2021  . PNEUMOCOCCAL POLYSACCHARIDE VACCINE (2) 10/08/2021  .  Hepatitis C Screening  Completed      Plan:     I have personally reviewed and noted the following in the patient's chart:   . Medical and social history . Use of alcohol, tobacco or illicit drugs  . Current medications and supplements . Functional ability and status . Nutritional status . Physical activity . Advanced directives . List of other physicians . Vitals . Screenings to include cognitive, depression, and falls . Referrals and appointments  In addition, I have reviewed and discussed with patient certain preventive protocols, quality metrics, and best practice recommendations. A written personalized care plan for preventive services as well as general preventive health recommendations were provided to patient.     Michiel Cowboy, RN  12/14/2017

## 2017-12-15 DIAGNOSIS — N186 End stage renal disease: Secondary | ICD-10-CM | POA: Diagnosis not present

## 2017-12-16 DIAGNOSIS — N186 End stage renal disease: Secondary | ICD-10-CM | POA: Diagnosis not present

## 2017-12-17 DIAGNOSIS — N186 End stage renal disease: Secondary | ICD-10-CM | POA: Diagnosis not present

## 2017-12-18 DIAGNOSIS — N186 End stage renal disease: Secondary | ICD-10-CM | POA: Diagnosis not present

## 2017-12-19 DIAGNOSIS — N186 End stage renal disease: Secondary | ICD-10-CM | POA: Diagnosis not present

## 2017-12-20 DIAGNOSIS — N186 End stage renal disease: Secondary | ICD-10-CM | POA: Diagnosis not present

## 2017-12-21 DIAGNOSIS — N186 End stage renal disease: Secondary | ICD-10-CM | POA: Diagnosis not present

## 2017-12-22 DIAGNOSIS — N186 End stage renal disease: Secondary | ICD-10-CM | POA: Diagnosis not present

## 2017-12-23 DIAGNOSIS — N186 End stage renal disease: Secondary | ICD-10-CM | POA: Diagnosis not present

## 2017-12-23 DIAGNOSIS — Z992 Dependence on renal dialysis: Secondary | ICD-10-CM | POA: Diagnosis not present

## 2017-12-24 DIAGNOSIS — N186 End stage renal disease: Secondary | ICD-10-CM | POA: Diagnosis not present

## 2017-12-24 DIAGNOSIS — D631 Anemia in chronic kidney disease: Secondary | ICD-10-CM | POA: Diagnosis not present

## 2017-12-24 DIAGNOSIS — D509 Iron deficiency anemia, unspecified: Secondary | ICD-10-CM | POA: Diagnosis not present

## 2017-12-24 DIAGNOSIS — N2581 Secondary hyperparathyroidism of renal origin: Secondary | ICD-10-CM | POA: Diagnosis not present

## 2017-12-25 DIAGNOSIS — N2581 Secondary hyperparathyroidism of renal origin: Secondary | ICD-10-CM | POA: Diagnosis not present

## 2017-12-25 DIAGNOSIS — D631 Anemia in chronic kidney disease: Secondary | ICD-10-CM | POA: Diagnosis not present

## 2017-12-25 DIAGNOSIS — N186 End stage renal disease: Secondary | ICD-10-CM | POA: Diagnosis not present

## 2017-12-25 DIAGNOSIS — D509 Iron deficiency anemia, unspecified: Secondary | ICD-10-CM | POA: Diagnosis not present

## 2017-12-26 DIAGNOSIS — E119 Type 2 diabetes mellitus without complications: Secondary | ICD-10-CM | POA: Diagnosis not present

## 2017-12-26 DIAGNOSIS — D509 Iron deficiency anemia, unspecified: Secondary | ICD-10-CM | POA: Diagnosis not present

## 2017-12-26 DIAGNOSIS — N2581 Secondary hyperparathyroidism of renal origin: Secondary | ICD-10-CM | POA: Diagnosis not present

## 2017-12-26 DIAGNOSIS — N186 End stage renal disease: Secondary | ICD-10-CM | POA: Diagnosis not present

## 2017-12-26 DIAGNOSIS — D631 Anemia in chronic kidney disease: Secondary | ICD-10-CM | POA: Diagnosis not present

## 2017-12-27 DIAGNOSIS — D631 Anemia in chronic kidney disease: Secondary | ICD-10-CM | POA: Diagnosis not present

## 2017-12-27 DIAGNOSIS — N186 End stage renal disease: Secondary | ICD-10-CM | POA: Diagnosis not present

## 2017-12-27 DIAGNOSIS — D509 Iron deficiency anemia, unspecified: Secondary | ICD-10-CM | POA: Diagnosis not present

## 2017-12-27 DIAGNOSIS — N2581 Secondary hyperparathyroidism of renal origin: Secondary | ICD-10-CM | POA: Diagnosis not present

## 2017-12-28 DIAGNOSIS — D509 Iron deficiency anemia, unspecified: Secondary | ICD-10-CM | POA: Diagnosis not present

## 2017-12-28 DIAGNOSIS — N186 End stage renal disease: Secondary | ICD-10-CM | POA: Diagnosis not present

## 2017-12-28 DIAGNOSIS — N2581 Secondary hyperparathyroidism of renal origin: Secondary | ICD-10-CM | POA: Diagnosis not present

## 2017-12-28 DIAGNOSIS — D631 Anemia in chronic kidney disease: Secondary | ICD-10-CM | POA: Diagnosis not present

## 2017-12-29 DIAGNOSIS — N186 End stage renal disease: Secondary | ICD-10-CM | POA: Diagnosis not present

## 2017-12-29 DIAGNOSIS — N2581 Secondary hyperparathyroidism of renal origin: Secondary | ICD-10-CM | POA: Diagnosis not present

## 2017-12-29 DIAGNOSIS — D509 Iron deficiency anemia, unspecified: Secondary | ICD-10-CM | POA: Diagnosis not present

## 2017-12-29 DIAGNOSIS — D631 Anemia in chronic kidney disease: Secondary | ICD-10-CM | POA: Diagnosis not present

## 2017-12-30 DIAGNOSIS — D631 Anemia in chronic kidney disease: Secondary | ICD-10-CM | POA: Diagnosis not present

## 2017-12-30 DIAGNOSIS — N2581 Secondary hyperparathyroidism of renal origin: Secondary | ICD-10-CM | POA: Diagnosis not present

## 2017-12-30 DIAGNOSIS — D509 Iron deficiency anemia, unspecified: Secondary | ICD-10-CM | POA: Diagnosis not present

## 2017-12-30 DIAGNOSIS — N186 End stage renal disease: Secondary | ICD-10-CM | POA: Diagnosis not present

## 2017-12-31 DIAGNOSIS — N186 End stage renal disease: Secondary | ICD-10-CM | POA: Diagnosis not present

## 2017-12-31 DIAGNOSIS — D509 Iron deficiency anemia, unspecified: Secondary | ICD-10-CM | POA: Diagnosis not present

## 2017-12-31 DIAGNOSIS — D631 Anemia in chronic kidney disease: Secondary | ICD-10-CM | POA: Diagnosis not present

## 2017-12-31 DIAGNOSIS — N2581 Secondary hyperparathyroidism of renal origin: Secondary | ICD-10-CM | POA: Diagnosis not present

## 2018-01-01 DIAGNOSIS — D631 Anemia in chronic kidney disease: Secondary | ICD-10-CM | POA: Diagnosis not present

## 2018-01-01 DIAGNOSIS — D509 Iron deficiency anemia, unspecified: Secondary | ICD-10-CM | POA: Diagnosis not present

## 2018-01-01 DIAGNOSIS — N186 End stage renal disease: Secondary | ICD-10-CM | POA: Diagnosis not present

## 2018-01-01 DIAGNOSIS — N2581 Secondary hyperparathyroidism of renal origin: Secondary | ICD-10-CM | POA: Diagnosis not present

## 2018-01-02 DIAGNOSIS — N186 End stage renal disease: Secondary | ICD-10-CM | POA: Diagnosis not present

## 2018-01-02 DIAGNOSIS — D631 Anemia in chronic kidney disease: Secondary | ICD-10-CM | POA: Diagnosis not present

## 2018-01-02 DIAGNOSIS — D509 Iron deficiency anemia, unspecified: Secondary | ICD-10-CM | POA: Diagnosis not present

## 2018-01-02 DIAGNOSIS — N2581 Secondary hyperparathyroidism of renal origin: Secondary | ICD-10-CM | POA: Diagnosis not present

## 2018-01-03 DIAGNOSIS — N186 End stage renal disease: Secondary | ICD-10-CM | POA: Diagnosis not present

## 2018-01-04 DIAGNOSIS — N186 End stage renal disease: Secondary | ICD-10-CM | POA: Diagnosis not present

## 2018-01-05 DIAGNOSIS — N186 End stage renal disease: Secondary | ICD-10-CM | POA: Diagnosis not present

## 2018-01-06 DIAGNOSIS — N186 End stage renal disease: Secondary | ICD-10-CM | POA: Diagnosis not present

## 2018-01-07 DIAGNOSIS — N186 End stage renal disease: Secondary | ICD-10-CM | POA: Diagnosis not present

## 2018-01-08 DIAGNOSIS — N186 End stage renal disease: Secondary | ICD-10-CM | POA: Diagnosis not present

## 2018-01-09 DIAGNOSIS — N186 End stage renal disease: Secondary | ICD-10-CM | POA: Diagnosis not present

## 2018-01-10 DIAGNOSIS — N186 End stage renal disease: Secondary | ICD-10-CM | POA: Diagnosis not present

## 2018-01-11 DIAGNOSIS — N186 End stage renal disease: Secondary | ICD-10-CM | POA: Diagnosis not present

## 2018-01-12 DIAGNOSIS — N186 End stage renal disease: Secondary | ICD-10-CM | POA: Diagnosis not present

## 2018-01-13 DIAGNOSIS — N186 End stage renal disease: Secondary | ICD-10-CM | POA: Diagnosis not present

## 2018-01-14 DIAGNOSIS — N186 End stage renal disease: Secondary | ICD-10-CM | POA: Diagnosis not present

## 2018-01-15 DIAGNOSIS — N186 End stage renal disease: Secondary | ICD-10-CM | POA: Diagnosis not present

## 2018-01-16 DIAGNOSIS — N186 End stage renal disease: Secondary | ICD-10-CM | POA: Diagnosis not present

## 2018-01-17 DIAGNOSIS — N186 End stage renal disease: Secondary | ICD-10-CM | POA: Diagnosis not present

## 2018-01-18 DIAGNOSIS — N186 End stage renal disease: Secondary | ICD-10-CM | POA: Diagnosis not present

## 2018-01-19 DIAGNOSIS — N186 End stage renal disease: Secondary | ICD-10-CM | POA: Diagnosis not present

## 2018-01-20 DIAGNOSIS — N186 End stage renal disease: Secondary | ICD-10-CM | POA: Diagnosis not present

## 2018-01-21 DIAGNOSIS — N186 End stage renal disease: Secondary | ICD-10-CM | POA: Diagnosis not present

## 2018-01-22 DIAGNOSIS — N186 End stage renal disease: Secondary | ICD-10-CM | POA: Diagnosis not present

## 2018-01-23 DIAGNOSIS — N186 End stage renal disease: Secondary | ICD-10-CM | POA: Diagnosis not present

## 2018-01-23 DIAGNOSIS — Z992 Dependence on renal dialysis: Secondary | ICD-10-CM | POA: Diagnosis not present

## 2018-01-24 DIAGNOSIS — N2581 Secondary hyperparathyroidism of renal origin: Secondary | ICD-10-CM | POA: Diagnosis not present

## 2018-01-24 DIAGNOSIS — D631 Anemia in chronic kidney disease: Secondary | ICD-10-CM | POA: Diagnosis not present

## 2018-01-24 DIAGNOSIS — D509 Iron deficiency anemia, unspecified: Secondary | ICD-10-CM | POA: Diagnosis not present

## 2018-01-24 DIAGNOSIS — N186 End stage renal disease: Secondary | ICD-10-CM | POA: Diagnosis not present

## 2018-01-25 DIAGNOSIS — K769 Liver disease, unspecified: Secondary | ICD-10-CM | POA: Diagnosis not present

## 2018-01-25 DIAGNOSIS — Z905 Acquired absence of kidney: Secondary | ICD-10-CM | POA: Diagnosis not present

## 2018-01-25 DIAGNOSIS — N2581 Secondary hyperparathyroidism of renal origin: Secondary | ICD-10-CM | POA: Diagnosis not present

## 2018-01-25 DIAGNOSIS — N186 End stage renal disease: Secondary | ICD-10-CM | POA: Diagnosis not present

## 2018-01-25 DIAGNOSIS — D631 Anemia in chronic kidney disease: Secondary | ICD-10-CM | POA: Diagnosis not present

## 2018-01-25 DIAGNOSIS — K7689 Other specified diseases of liver: Secondary | ICD-10-CM | POA: Diagnosis not present

## 2018-01-25 DIAGNOSIS — K760 Fatty (change of) liver, not elsewhere classified: Secondary | ICD-10-CM | POA: Diagnosis not present

## 2018-01-25 DIAGNOSIS — D509 Iron deficiency anemia, unspecified: Secondary | ICD-10-CM | POA: Diagnosis not present

## 2018-01-26 DIAGNOSIS — N186 End stage renal disease: Secondary | ICD-10-CM | POA: Diagnosis not present

## 2018-01-26 DIAGNOSIS — D509 Iron deficiency anemia, unspecified: Secondary | ICD-10-CM | POA: Diagnosis not present

## 2018-01-26 DIAGNOSIS — N2581 Secondary hyperparathyroidism of renal origin: Secondary | ICD-10-CM | POA: Diagnosis not present

## 2018-01-26 DIAGNOSIS — D631 Anemia in chronic kidney disease: Secondary | ICD-10-CM | POA: Diagnosis not present

## 2018-01-27 DIAGNOSIS — N186 End stage renal disease: Secondary | ICD-10-CM | POA: Diagnosis not present

## 2018-01-27 DIAGNOSIS — D631 Anemia in chronic kidney disease: Secondary | ICD-10-CM | POA: Diagnosis not present

## 2018-01-27 DIAGNOSIS — N2581 Secondary hyperparathyroidism of renal origin: Secondary | ICD-10-CM | POA: Diagnosis not present

## 2018-01-27 DIAGNOSIS — D509 Iron deficiency anemia, unspecified: Secondary | ICD-10-CM | POA: Diagnosis not present

## 2018-01-28 DIAGNOSIS — N186 End stage renal disease: Secondary | ICD-10-CM | POA: Diagnosis not present

## 2018-01-28 DIAGNOSIS — E119 Type 2 diabetes mellitus without complications: Secondary | ICD-10-CM | POA: Diagnosis not present

## 2018-01-28 DIAGNOSIS — D509 Iron deficiency anemia, unspecified: Secondary | ICD-10-CM | POA: Diagnosis not present

## 2018-01-28 DIAGNOSIS — N2581 Secondary hyperparathyroidism of renal origin: Secondary | ICD-10-CM | POA: Diagnosis not present

## 2018-01-28 DIAGNOSIS — D631 Anemia in chronic kidney disease: Secondary | ICD-10-CM | POA: Diagnosis not present

## 2018-01-29 DIAGNOSIS — N186 End stage renal disease: Secondary | ICD-10-CM | POA: Diagnosis not present

## 2018-01-29 DIAGNOSIS — D509 Iron deficiency anemia, unspecified: Secondary | ICD-10-CM | POA: Diagnosis not present

## 2018-01-29 DIAGNOSIS — D631 Anemia in chronic kidney disease: Secondary | ICD-10-CM | POA: Diagnosis not present

## 2018-01-29 DIAGNOSIS — N2581 Secondary hyperparathyroidism of renal origin: Secondary | ICD-10-CM | POA: Diagnosis not present

## 2018-01-30 DIAGNOSIS — D509 Iron deficiency anemia, unspecified: Secondary | ICD-10-CM | POA: Diagnosis not present

## 2018-01-30 DIAGNOSIS — N2581 Secondary hyperparathyroidism of renal origin: Secondary | ICD-10-CM | POA: Diagnosis not present

## 2018-01-30 DIAGNOSIS — D631 Anemia in chronic kidney disease: Secondary | ICD-10-CM | POA: Diagnosis not present

## 2018-01-30 DIAGNOSIS — N186 End stage renal disease: Secondary | ICD-10-CM | POA: Diagnosis not present

## 2018-01-31 DIAGNOSIS — N2581 Secondary hyperparathyroidism of renal origin: Secondary | ICD-10-CM | POA: Diagnosis not present

## 2018-01-31 DIAGNOSIS — D509 Iron deficiency anemia, unspecified: Secondary | ICD-10-CM | POA: Diagnosis not present

## 2018-01-31 DIAGNOSIS — N186 End stage renal disease: Secondary | ICD-10-CM | POA: Diagnosis not present

## 2018-01-31 DIAGNOSIS — D631 Anemia in chronic kidney disease: Secondary | ICD-10-CM | POA: Diagnosis not present

## 2018-02-01 DIAGNOSIS — D631 Anemia in chronic kidney disease: Secondary | ICD-10-CM | POA: Diagnosis not present

## 2018-02-01 DIAGNOSIS — D509 Iron deficiency anemia, unspecified: Secondary | ICD-10-CM | POA: Diagnosis not present

## 2018-02-01 DIAGNOSIS — N2581 Secondary hyperparathyroidism of renal origin: Secondary | ICD-10-CM | POA: Diagnosis not present

## 2018-02-01 DIAGNOSIS — N186 End stage renal disease: Secondary | ICD-10-CM | POA: Diagnosis not present

## 2018-02-02 DIAGNOSIS — N186 End stage renal disease: Secondary | ICD-10-CM | POA: Diagnosis not present

## 2018-02-02 DIAGNOSIS — D631 Anemia in chronic kidney disease: Secondary | ICD-10-CM | POA: Diagnosis not present

## 2018-02-02 DIAGNOSIS — N2581 Secondary hyperparathyroidism of renal origin: Secondary | ICD-10-CM | POA: Diagnosis not present

## 2018-02-02 DIAGNOSIS — D509 Iron deficiency anemia, unspecified: Secondary | ICD-10-CM | POA: Diagnosis not present

## 2018-02-03 DIAGNOSIS — N186 End stage renal disease: Secondary | ICD-10-CM | POA: Diagnosis not present

## 2018-02-04 DIAGNOSIS — N186 End stage renal disease: Secondary | ICD-10-CM | POA: Diagnosis not present

## 2018-02-05 DIAGNOSIS — N186 End stage renal disease: Secondary | ICD-10-CM | POA: Diagnosis not present

## 2018-02-06 DIAGNOSIS — N186 End stage renal disease: Secondary | ICD-10-CM | POA: Diagnosis not present

## 2018-02-07 DIAGNOSIS — N186 End stage renal disease: Secondary | ICD-10-CM | POA: Diagnosis not present

## 2018-02-08 DIAGNOSIS — N186 End stage renal disease: Secondary | ICD-10-CM | POA: Diagnosis not present

## 2018-02-09 DIAGNOSIS — N186 End stage renal disease: Secondary | ICD-10-CM | POA: Diagnosis not present

## 2018-02-10 DIAGNOSIS — N186 End stage renal disease: Secondary | ICD-10-CM | POA: Diagnosis not present

## 2018-02-11 DIAGNOSIS — N186 End stage renal disease: Secondary | ICD-10-CM | POA: Diagnosis not present

## 2018-02-12 DIAGNOSIS — N186 End stage renal disease: Secondary | ICD-10-CM | POA: Diagnosis not present

## 2018-02-13 DIAGNOSIS — N186 End stage renal disease: Secondary | ICD-10-CM | POA: Diagnosis not present

## 2018-02-14 DIAGNOSIS — N186 End stage renal disease: Secondary | ICD-10-CM | POA: Diagnosis not present

## 2018-02-15 DIAGNOSIS — N186 End stage renal disease: Secondary | ICD-10-CM | POA: Diagnosis not present

## 2018-02-16 DIAGNOSIS — N186 End stage renal disease: Secondary | ICD-10-CM | POA: Diagnosis not present

## 2018-02-17 DIAGNOSIS — N186 End stage renal disease: Secondary | ICD-10-CM | POA: Diagnosis not present

## 2018-02-18 DIAGNOSIS — N186 End stage renal disease: Secondary | ICD-10-CM | POA: Diagnosis not present

## 2018-02-19 DIAGNOSIS — N186 End stage renal disease: Secondary | ICD-10-CM | POA: Diagnosis not present

## 2018-02-20 DIAGNOSIS — N186 End stage renal disease: Secondary | ICD-10-CM | POA: Diagnosis not present

## 2018-02-21 DIAGNOSIS — N186 End stage renal disease: Secondary | ICD-10-CM | POA: Diagnosis not present

## 2018-02-22 DIAGNOSIS — N186 End stage renal disease: Secondary | ICD-10-CM | POA: Diagnosis not present

## 2018-02-23 DIAGNOSIS — N186 End stage renal disease: Secondary | ICD-10-CM | POA: Diagnosis not present

## 2018-02-23 DIAGNOSIS — Z992 Dependence on renal dialysis: Secondary | ICD-10-CM | POA: Diagnosis not present

## 2018-02-24 DIAGNOSIS — N186 End stage renal disease: Secondary | ICD-10-CM | POA: Diagnosis not present

## 2018-02-24 DIAGNOSIS — N2581 Secondary hyperparathyroidism of renal origin: Secondary | ICD-10-CM | POA: Diagnosis not present

## 2018-02-24 DIAGNOSIS — D509 Iron deficiency anemia, unspecified: Secondary | ICD-10-CM | POA: Diagnosis not present

## 2018-02-24 DIAGNOSIS — D631 Anemia in chronic kidney disease: Secondary | ICD-10-CM | POA: Diagnosis not present

## 2018-02-25 DIAGNOSIS — D631 Anemia in chronic kidney disease: Secondary | ICD-10-CM | POA: Diagnosis not present

## 2018-02-25 DIAGNOSIS — N2581 Secondary hyperparathyroidism of renal origin: Secondary | ICD-10-CM | POA: Diagnosis not present

## 2018-02-25 DIAGNOSIS — D509 Iron deficiency anemia, unspecified: Secondary | ICD-10-CM | POA: Diagnosis not present

## 2018-02-25 DIAGNOSIS — N186 End stage renal disease: Secondary | ICD-10-CM | POA: Diagnosis not present

## 2018-02-26 DIAGNOSIS — N186 End stage renal disease: Secondary | ICD-10-CM | POA: Diagnosis not present

## 2018-02-26 DIAGNOSIS — D631 Anemia in chronic kidney disease: Secondary | ICD-10-CM | POA: Diagnosis not present

## 2018-02-26 DIAGNOSIS — N2581 Secondary hyperparathyroidism of renal origin: Secondary | ICD-10-CM | POA: Diagnosis not present

## 2018-02-26 DIAGNOSIS — D509 Iron deficiency anemia, unspecified: Secondary | ICD-10-CM | POA: Diagnosis not present

## 2018-02-27 DIAGNOSIS — D631 Anemia in chronic kidney disease: Secondary | ICD-10-CM | POA: Diagnosis not present

## 2018-02-27 DIAGNOSIS — D509 Iron deficiency anemia, unspecified: Secondary | ICD-10-CM | POA: Diagnosis not present

## 2018-02-27 DIAGNOSIS — N186 End stage renal disease: Secondary | ICD-10-CM | POA: Diagnosis not present

## 2018-02-27 DIAGNOSIS — N2581 Secondary hyperparathyroidism of renal origin: Secondary | ICD-10-CM | POA: Diagnosis not present

## 2018-02-28 DIAGNOSIS — N2581 Secondary hyperparathyroidism of renal origin: Secondary | ICD-10-CM | POA: Diagnosis not present

## 2018-02-28 DIAGNOSIS — D509 Iron deficiency anemia, unspecified: Secondary | ICD-10-CM | POA: Diagnosis not present

## 2018-02-28 DIAGNOSIS — N186 End stage renal disease: Secondary | ICD-10-CM | POA: Diagnosis not present

## 2018-02-28 DIAGNOSIS — D631 Anemia in chronic kidney disease: Secondary | ICD-10-CM | POA: Diagnosis not present

## 2018-03-01 DIAGNOSIS — N186 End stage renal disease: Secondary | ICD-10-CM | POA: Diagnosis not present

## 2018-03-01 DIAGNOSIS — D509 Iron deficiency anemia, unspecified: Secondary | ICD-10-CM | POA: Diagnosis not present

## 2018-03-01 DIAGNOSIS — D631 Anemia in chronic kidney disease: Secondary | ICD-10-CM | POA: Diagnosis not present

## 2018-03-01 DIAGNOSIS — N2581 Secondary hyperparathyroidism of renal origin: Secondary | ICD-10-CM | POA: Diagnosis not present

## 2018-03-02 DIAGNOSIS — D631 Anemia in chronic kidney disease: Secondary | ICD-10-CM | POA: Diagnosis not present

## 2018-03-02 DIAGNOSIS — N2581 Secondary hyperparathyroidism of renal origin: Secondary | ICD-10-CM | POA: Diagnosis not present

## 2018-03-02 DIAGNOSIS — D509 Iron deficiency anemia, unspecified: Secondary | ICD-10-CM | POA: Diagnosis not present

## 2018-03-02 DIAGNOSIS — N186 End stage renal disease: Secondary | ICD-10-CM | POA: Diagnosis not present

## 2018-03-03 DIAGNOSIS — N186 End stage renal disease: Secondary | ICD-10-CM | POA: Diagnosis not present

## 2018-03-03 DIAGNOSIS — N2581 Secondary hyperparathyroidism of renal origin: Secondary | ICD-10-CM | POA: Diagnosis not present

## 2018-03-03 DIAGNOSIS — D509 Iron deficiency anemia, unspecified: Secondary | ICD-10-CM | POA: Diagnosis not present

## 2018-03-03 DIAGNOSIS — D631 Anemia in chronic kidney disease: Secondary | ICD-10-CM | POA: Diagnosis not present

## 2018-03-04 DIAGNOSIS — N186 End stage renal disease: Secondary | ICD-10-CM | POA: Diagnosis not present

## 2018-03-04 DIAGNOSIS — D509 Iron deficiency anemia, unspecified: Secondary | ICD-10-CM | POA: Diagnosis not present

## 2018-03-04 DIAGNOSIS — D631 Anemia in chronic kidney disease: Secondary | ICD-10-CM | POA: Diagnosis not present

## 2018-03-04 DIAGNOSIS — N2581 Secondary hyperparathyroidism of renal origin: Secondary | ICD-10-CM | POA: Diagnosis not present

## 2018-03-05 DIAGNOSIS — D631 Anemia in chronic kidney disease: Secondary | ICD-10-CM | POA: Diagnosis not present

## 2018-03-05 DIAGNOSIS — E119 Type 2 diabetes mellitus without complications: Secondary | ICD-10-CM | POA: Diagnosis not present

## 2018-03-05 DIAGNOSIS — N2581 Secondary hyperparathyroidism of renal origin: Secondary | ICD-10-CM | POA: Diagnosis not present

## 2018-03-05 DIAGNOSIS — N186 End stage renal disease: Secondary | ICD-10-CM | POA: Diagnosis not present

## 2018-03-05 DIAGNOSIS — D509 Iron deficiency anemia, unspecified: Secondary | ICD-10-CM | POA: Diagnosis not present

## 2018-03-06 DIAGNOSIS — N186 End stage renal disease: Secondary | ICD-10-CM | POA: Diagnosis not present

## 2018-03-07 DIAGNOSIS — N186 End stage renal disease: Secondary | ICD-10-CM | POA: Diagnosis not present

## 2018-03-08 DIAGNOSIS — N186 End stage renal disease: Secondary | ICD-10-CM | POA: Diagnosis not present

## 2018-03-09 DIAGNOSIS — N186 End stage renal disease: Secondary | ICD-10-CM | POA: Diagnosis not present

## 2018-03-10 DIAGNOSIS — N186 End stage renal disease: Secondary | ICD-10-CM | POA: Diagnosis not present

## 2018-03-11 DIAGNOSIS — N186 End stage renal disease: Secondary | ICD-10-CM | POA: Diagnosis not present

## 2018-03-12 DIAGNOSIS — N186 End stage renal disease: Secondary | ICD-10-CM | POA: Diagnosis not present

## 2018-03-13 DIAGNOSIS — Z4932 Encounter for adequacy testing for peritoneal dialysis: Secondary | ICD-10-CM | POA: Diagnosis not present

## 2018-03-13 DIAGNOSIS — N186 End stage renal disease: Secondary | ICD-10-CM | POA: Diagnosis not present

## 2018-03-14 DIAGNOSIS — N186 End stage renal disease: Secondary | ICD-10-CM | POA: Diagnosis not present

## 2018-03-15 DIAGNOSIS — N186 End stage renal disease: Secondary | ICD-10-CM | POA: Diagnosis not present

## 2018-03-16 DIAGNOSIS — N186 End stage renal disease: Secondary | ICD-10-CM | POA: Diagnosis not present

## 2018-03-17 DIAGNOSIS — N186 End stage renal disease: Secondary | ICD-10-CM | POA: Diagnosis not present

## 2018-03-18 DIAGNOSIS — N186 End stage renal disease: Secondary | ICD-10-CM | POA: Diagnosis not present

## 2018-03-19 DIAGNOSIS — N186 End stage renal disease: Secondary | ICD-10-CM | POA: Diagnosis not present

## 2018-03-20 DIAGNOSIS — N186 End stage renal disease: Secondary | ICD-10-CM | POA: Diagnosis not present

## 2018-03-21 DIAGNOSIS — N186 End stage renal disease: Secondary | ICD-10-CM | POA: Diagnosis not present

## 2018-03-22 DIAGNOSIS — N186 End stage renal disease: Secondary | ICD-10-CM | POA: Diagnosis not present

## 2018-03-23 DIAGNOSIS — N186 End stage renal disease: Secondary | ICD-10-CM | POA: Diagnosis not present

## 2018-03-24 DIAGNOSIS — N186 End stage renal disease: Secondary | ICD-10-CM | POA: Diagnosis not present

## 2018-03-25 DIAGNOSIS — Z992 Dependence on renal dialysis: Secondary | ICD-10-CM | POA: Diagnosis not present

## 2018-03-25 DIAGNOSIS — N186 End stage renal disease: Secondary | ICD-10-CM | POA: Diagnosis not present

## 2018-03-26 DIAGNOSIS — N186 End stage renal disease: Secondary | ICD-10-CM | POA: Diagnosis not present

## 2018-03-26 DIAGNOSIS — N2581 Secondary hyperparathyroidism of renal origin: Secondary | ICD-10-CM | POA: Diagnosis not present

## 2018-03-26 DIAGNOSIS — Z23 Encounter for immunization: Secondary | ICD-10-CM | POA: Diagnosis not present

## 2018-03-26 DIAGNOSIS — D631 Anemia in chronic kidney disease: Secondary | ICD-10-CM | POA: Diagnosis not present

## 2018-03-26 DIAGNOSIS — D509 Iron deficiency anemia, unspecified: Secondary | ICD-10-CM | POA: Diagnosis not present

## 2018-03-27 DIAGNOSIS — D631 Anemia in chronic kidney disease: Secondary | ICD-10-CM | POA: Diagnosis not present

## 2018-03-27 DIAGNOSIS — N186 End stage renal disease: Secondary | ICD-10-CM | POA: Diagnosis not present

## 2018-03-27 DIAGNOSIS — N2581 Secondary hyperparathyroidism of renal origin: Secondary | ICD-10-CM | POA: Diagnosis not present

## 2018-03-27 DIAGNOSIS — D509 Iron deficiency anemia, unspecified: Secondary | ICD-10-CM | POA: Diagnosis not present

## 2018-03-27 DIAGNOSIS — Z23 Encounter for immunization: Secondary | ICD-10-CM | POA: Diagnosis not present

## 2018-03-28 DIAGNOSIS — N186 End stage renal disease: Secondary | ICD-10-CM | POA: Diagnosis not present

## 2018-03-28 DIAGNOSIS — Z23 Encounter for immunization: Secondary | ICD-10-CM | POA: Diagnosis not present

## 2018-03-28 DIAGNOSIS — D631 Anemia in chronic kidney disease: Secondary | ICD-10-CM | POA: Diagnosis not present

## 2018-03-28 DIAGNOSIS — D509 Iron deficiency anemia, unspecified: Secondary | ICD-10-CM | POA: Diagnosis not present

## 2018-03-28 DIAGNOSIS — N2581 Secondary hyperparathyroidism of renal origin: Secondary | ICD-10-CM | POA: Diagnosis not present

## 2018-03-29 DIAGNOSIS — D509 Iron deficiency anemia, unspecified: Secondary | ICD-10-CM | POA: Diagnosis not present

## 2018-03-29 DIAGNOSIS — N186 End stage renal disease: Secondary | ICD-10-CM | POA: Diagnosis not present

## 2018-03-29 DIAGNOSIS — Z23 Encounter for immunization: Secondary | ICD-10-CM | POA: Diagnosis not present

## 2018-03-29 DIAGNOSIS — N2581 Secondary hyperparathyroidism of renal origin: Secondary | ICD-10-CM | POA: Diagnosis not present

## 2018-03-29 DIAGNOSIS — D631 Anemia in chronic kidney disease: Secondary | ICD-10-CM | POA: Diagnosis not present

## 2018-03-30 DIAGNOSIS — D509 Iron deficiency anemia, unspecified: Secondary | ICD-10-CM | POA: Diagnosis not present

## 2018-03-30 DIAGNOSIS — N2581 Secondary hyperparathyroidism of renal origin: Secondary | ICD-10-CM | POA: Diagnosis not present

## 2018-03-30 DIAGNOSIS — N186 End stage renal disease: Secondary | ICD-10-CM | POA: Diagnosis not present

## 2018-03-30 DIAGNOSIS — Z23 Encounter for immunization: Secondary | ICD-10-CM | POA: Diagnosis not present

## 2018-03-30 DIAGNOSIS — D631 Anemia in chronic kidney disease: Secondary | ICD-10-CM | POA: Diagnosis not present

## 2018-03-31 DIAGNOSIS — D631 Anemia in chronic kidney disease: Secondary | ICD-10-CM | POA: Diagnosis not present

## 2018-03-31 DIAGNOSIS — Z23 Encounter for immunization: Secondary | ICD-10-CM | POA: Diagnosis not present

## 2018-03-31 DIAGNOSIS — D509 Iron deficiency anemia, unspecified: Secondary | ICD-10-CM | POA: Diagnosis not present

## 2018-03-31 DIAGNOSIS — N2581 Secondary hyperparathyroidism of renal origin: Secondary | ICD-10-CM | POA: Diagnosis not present

## 2018-03-31 DIAGNOSIS — N186 End stage renal disease: Secondary | ICD-10-CM | POA: Diagnosis not present

## 2018-04-01 DIAGNOSIS — Z23 Encounter for immunization: Secondary | ICD-10-CM | POA: Diagnosis not present

## 2018-04-01 DIAGNOSIS — N2581 Secondary hyperparathyroidism of renal origin: Secondary | ICD-10-CM | POA: Diagnosis not present

## 2018-04-01 DIAGNOSIS — D631 Anemia in chronic kidney disease: Secondary | ICD-10-CM | POA: Diagnosis not present

## 2018-04-01 DIAGNOSIS — D509 Iron deficiency anemia, unspecified: Secondary | ICD-10-CM | POA: Diagnosis not present

## 2018-04-01 DIAGNOSIS — N186 End stage renal disease: Secondary | ICD-10-CM | POA: Diagnosis not present

## 2018-04-01 NOTE — Progress Notes (Addendum)
Subjective:   Mary Valencia is a 62 y.o. female who presents for Medicare Annual (Subsequent) preventive examination.  Review of Systems:  No ROS.  Medicare Wellness Visit. Additional risk factors are reflected in the social history.  Cardiac Risk Factors include: advanced age (>50mn, >>16women);diabetes mellitus;dyslipidemia;hypertension Sleep patterns: has interrupted sleep, gets up 1-2 times nightly to void and sleeps 5-6 hours nightly. Patient reports insomnia issues, discussed recommended sleep tips and stress reduction tips.   Home Safety/Smoke Alarms: Feels safe in home. Smoke alarms in place.  Living environment; residence and Firearm Safety: 1-story house/ trailer, no firearms. Lives with husband, no needs for DME, good support system Seat Belt Safety/Bike Helmet: Wears seat belt.     Objective:     Vitals: BP (!) 146/84   Pulse 67   Resp 17   Ht '5\' 2"'$  (1.575 m)   Wt 144 lb (65.3 kg)   SpO2 100%   BMI 26.34 kg/m   Body mass index is 26.34 kg/m.  Advanced Directives 04/02/2018 11/27/2016 01/29/2012  Does Patient Have a Medical Advance Directive? No No Patient does not have advance directive  Does patient want to make changes to medical advance directive? Yes (ED - Information included in AVS) - -  Would patient like information on creating a medical advance directive? - No - Patient declined -    Tobacco Social History   Tobacco Use  Smoking Status Never Smoker  Smokeless Tobacco Never Used     Counseling given: Not Answered  Past Medical History:  Diagnosis Date  . Allergic rhinitis   . 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 Edema 10/27/2012   L>R  . GERD (gastroesophageal reflux disease)   . HTN (hypertension)   . Hyperlipidemia   . Type II or unspecified type diabetes mellitus without mention of complication, not stated as uncontrolled    Past Surgical History:  Procedure Laterality Date  . ABDOMINAL HYSTERECTOMY     partial  . KIDNEY  SURGERY     right removed  . NEPHRECTOMY     right - related to untreated strep infection and stones  . TONSILLECTOMY     Family History  Problem Relation Age of Onset  . Heart disease Mother 621      MI  . Heart disease Father [id number]      MI  . Alcohol abuse Sister   . Kidney disease Sister   . Heart disease Brother        ?CHF ?CAD  . Heart disease Maternal Aunt   . Diabetes Other    Social History   Socioeconomic History  . Marital status: Married    Spouse name: Not on file  . Number of children: 5  . Years of education: Not on file  . Highest education level: Not on file  Occupational History  . Not on file  Social Needs  . Financial resource strain: Somewhat hard  . Food insecurity:    Worry: Never true    Inability: Never true  . Transportation needs:    Medical: No    Non-medical: No  Tobacco Use  . Smoking status: Never Smoker  . Smokeless tobacco: Never Used  Substance and Sexual Activity  . Alcohol use: No  . Drug use: No  . Sexual activity: Yes  Lifestyle  . Physical activity:    Days per week: 0 days    Minutes per session: 0 min  . Stress:  To some extent  Relationships  . Social connections:    Talks on phone: More than three times a week    Gets together: More than three times a week    Attends religious service: More than 4 times per year    Active member of club or organization: Yes    Attends meetings of clubs or organizations: More than 4 times per year    Relationship status: Married  Other Topics Concern  . 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 ACCU-CHEK FASTCLIX LANCETS MISC Use to check blood glucose TID (DX: E11.8, Z79.4)  . Alcohol Swabs (B-D SINGLE USE SWABS REGULAR) PADS Use as directed  . Blood Glucose Calibration (ACCU-CHEK SMARTVIEW CONTROL) LIQD Use as directed  . Blood  Glucose Monitoring Suppl (ACCU-CHEK NANO SMARTVIEW) w/Device KIT Use to check blood glucose TID (DX: E11.8, Z79.4)  . calcium carbonate (TUMS - DOSED IN MG ELEMENTAL CALCIUM) 500 MG chewable tablet   . CALCIUM-MAGNESIUM-VITAMIN D PO Take 1 tablet by mouth daily.  . diphenhydrAMINE (BENADRYL) 25 mg capsule Take 25 mg by mouth as needed.  Marland Kitchen glucose blood (ACCU-CHEK SMARTVIEW) test strip Use to check blood glucose TID (DX: E11.8, Z79.4)  . insulin NPH-regular Human (NOVOLIN 70/30) (70-30) 100 UNIT/ML injection Inject 10 Units into the skin 2 (two) times daily.  Marland Kitchen losartan (COZAAR) 100 MG tablet Take 100 mg by mouth daily.  Marland Kitchen NIFEdipine (PROCARDIA XL/ADALAT-CC) 60 MG 24 hr tablet 2 (two) times daily.   Marland Kitchen omeprazole (PRILOSEC) 40 MG capsule Take 1 capsule (40 mg total) by mouth daily.  . polyethylene glycol (MIRALAX / GLYCOLAX) packet Take 17 g by mouth.  . senna-docusate (SENOKOT-S) 8.6-50 MG tablet Take by mouth 2 (two) times daily. Take 2 tab twice daily.  Marland Kitchen torsemide (DEMADEX) 20 MG tablet Take 40 mg by mouth daily.   . [DISCONTINUED] omeprazole (PRILOSEC) 40 MG capsule Take 40 mg by mouth daily.  Marland Kitchen labetalol (NORMODYNE) 200 MG tablet Take 1 tablet (200 mg total) by mouth 3 (three) times daily. (Patient taking differently: Take 300 mg by mouth 3 (three) times daily. )  . rosuvastatin (CRESTOR) 20 MG tablet Take 1 tablet (20 mg total) by mouth daily.  . [DISCONTINUED] hydrALAZINE (APRESOLINE) 100 MG tablet Take 100 mg by mouth 3 (three) times daily.  . [DISCONTINUED] rosuvastatin (CRESTOR) 20 MG tablet Take 20 mg by mouth daily.   No facility-administered encounter medications on file as of 04/02/2018.     Activities of Daily Living In your present state of health, do you have any difficulty performing the following activities: 04/02/2018  Hearing? N  Vision? N  Difficulty concentrating or making decisions? N  Walking or climbing stairs? N  Dressing or bathing? Y  Doing errands, shopping? Y    Preparing Food and eating ? Y  Using the Toilet? N  In the past six months, have you accidently leaked urine? N  Do you have problems with loss of bowel control? N  Managing your Medications? N  Managing your Finances? N  Housekeeping or managing your Housekeeping? Y  Some recent data might be hidden    Patient Care Team: Plotnikov, Evie Lacks, MD as PCP - General Ninfa Linden Lind Guest, MD (Orthopedic Surgery) Boyd Kerbs, MD as Referring Physician (Neurology) Carren Rang, Nadara Mustard, MD (Obstetrics and Gynecology) Olivia Mackie,  Mercy Moore, MD as Referring Physician (Nephrology) Curly Rim, MD as Referring Physician (Internal Medicine)    Assessment:   This is a routine wellness examination for Aviah. Physical assessment deferred to PCP.   Exercise Activities and Dietary recommendations Current Exercise Habits: The patient does not participate in regular exercise at present(chair exercise print-outs provided), Exercise limited by: Other - see comments(dyalysis)  Diet (meal preparation, eat out, water intake, caffeinated beverages, dairy products, fruits and vegetables): in general, a "healthy" diet   Working with dietician to maintain healthy renal diet, dialysis patient.  Renal diet education provided.  Discussed supplementing with nepro, samples and coupons provided.  Goals    . Become active again in my church     Volunteer in my church I will am starting this week.     . Patient Stated     Stay as healthy and as independent as possible.       Fall Risk Fall Risk  04/02/2018 03/28/2017 11/27/2016  Falls in the past year? No Yes Yes  Number falls in past yr: - 1 1  Injury with Fall? - No -  Risk for fall due to : Impaired balance/gait - -   Depression Screen PHQ 2/9 Scores 04/02/2018 03/28/2017 11/27/2016  PHQ - 2 Score 1 0 0  PHQ- 9 Score 5 - -     Cognitive Function       Ad8 score reviewed for issues:  Issues making decisions: no  Less interest in hobbies /  activities: no  Repeats questions, stories (family complaining): no  Trouble using ordinary gadgets (microwave, computer, phone):no  Forgets the month or year: no  Mismanaging finances: no  Remembering appts: no  Daily problems with thinking and/or memory: no Ad8 score is= 0  Immunization History  Administered Date(s) Administered  . Influenza Whole 05/16/2005  . Pneumococcal Polysaccharide-23 10/08/2016  . Td 09/26/2004, 08/25/2011   Screening Tests Health Maintenance  Topic Date Due  . OPHTHALMOLOGY EXAM  06/12/1964  . HIV Screening  06/12/1969  . COLONOSCOPY  06/12/2004  . FOOT EXAM  05/06/2014  . HEMOGLOBIN A1C  03/24/2018  . INFLUENZA VACCINE  10/04/2018 (Originally 01/24/2018)  . MAMMOGRAM  10/10/2018  . TETANUS/TDAP  08/24/2021  . PNEUMOCOCCAL POLYSACCHARIDE VACCINE AGE 42-64 HIGH RISK  Completed  . Hepatitis C Screening  Completed      Plan:     Continue doing brain stimulating activities (puzzles, reading, adult coloring books, staying active) to keep memory sharp.   Continue to eat heart healthy diet (full of fruits, vegetables, whole grains, lean protein, water--limit salt, fat, and sugar intake) and increase physical activity as tolerated.  I have personally reviewed and noted the following in the patient's chart:   . Medical and social history . Use of alcohol, tobacco or illicit drugs  . Current medications and supplements . Functional ability and status . Nutritional status . Physical activity . Advanced directives . List of other physicians . Vitals . Screenings to include cognitive, depression, and falls . Referrals and appointments  In addition, I have reviewed and discussed with patient certain preventive protocols, quality metrics, and best practice recommendations. A written personalized care plan for preventive services as well as general preventive health recommendations were provided to patient.     Michiel Cowboy,  RN  04/02/2018  Medical screening examination/treatment/procedure(s) were performed by non-physician practitioner and as supervising physician I was immediately available for consultation/collaboration. I agree with above. Lew Dawes, MD

## 2018-04-02 ENCOUNTER — Ambulatory Visit (INDEPENDENT_AMBULATORY_CARE_PROVIDER_SITE_OTHER): Payer: Medicare HMO | Admitting: *Deleted

## 2018-04-02 VITALS — BP 146/84 | HR 67 | Resp 17 | Ht 62.0 in | Wt 144.0 lb

## 2018-04-02 DIAGNOSIS — E118 Type 2 diabetes mellitus with unspecified complications: Secondary | ICD-10-CM

## 2018-04-02 DIAGNOSIS — Z0000 Encounter for general adult medical examination without abnormal findings: Secondary | ICD-10-CM

## 2018-04-02 DIAGNOSIS — Z23 Encounter for immunization: Secondary | ICD-10-CM | POA: Diagnosis not present

## 2018-04-02 DIAGNOSIS — D509 Iron deficiency anemia, unspecified: Secondary | ICD-10-CM | POA: Diagnosis not present

## 2018-04-02 DIAGNOSIS — N2581 Secondary hyperparathyroidism of renal origin: Secondary | ICD-10-CM | POA: Diagnosis not present

## 2018-04-02 DIAGNOSIS — D631 Anemia in chronic kidney disease: Secondary | ICD-10-CM | POA: Diagnosis not present

## 2018-04-02 DIAGNOSIS — N186 End stage renal disease: Secondary | ICD-10-CM | POA: Diagnosis not present

## 2018-04-02 MED ORDER — OMEPRAZOLE 40 MG PO CPDR: 40.0000 mg | DELAYED_RELEASE_CAPSULE | Freq: Every day | ORAL | 3 refills | Status: DC

## 2018-04-02 MED ORDER — ROSUVASTATIN CALCIUM 20 MG PO TABS: 20.0000 mg | ORAL_TABLET | Freq: Every day | ORAL | 2 refills | Status: DC

## 2018-04-02 NOTE — Patient Instructions (Addendum)
Continue doing brain stimulating activities (puzzles, reading, adult coloring books, staying active) to keep memory sharp.   Continue to eat heart healthy diet (full of fruits, vegetables, whole grains, lean protein, water--limit salt, fat, and sugar intake) and increase physical activity as tolerated.   Ms. Eunice , Thank you for taking time to come for your Medicare Wellness Visit. I appreciate your ongoing commitment to your health goals. Please review the following plan we discussed and let me know if I can assist you in the future.   These are the goals we discussed: Goals    . Become active again in my church     Volunteer in my church I will am starting this week.     . Patient Stated     Stay as healthy and as independent as possible.       This is a list of the screening recommended for you and due dates:  Health Maintenance  Topic Date Due  . Eye exam for diabetics  06/12/1964  . HIV Screening  06/12/1969  . Colon Cancer Screening  06/12/2004  . Complete foot exam   05/06/2014  . Flu Shot  01/24/2018  . Hemoglobin A1C  03/24/2018  . Mammogram  10/10/2018  . Tetanus Vaccine  08/24/2021  . Pneumococcal vaccine  Completed  .  Hepatitis C: One time screening is recommended by Center for Disease Control  (CDC) for  adults born from [birth year] through 1967.   Completed    Health Maintenance, Female Adopting a healthy lifestyle and getting preventive care can go a long way to promote health and wellness. Talk with your health care provider about what schedule of regular examinations is right for you. This is a good chance for you to check in with your provider about disease prevention and staying healthy. In between checkups, there are plenty of things you can do on your own. Experts have done a lot of research about which lifestyle changes and preventive measures are most likely to keep you healthy. Ask your health care provider for more information. Weight and diet Eat a healthy  diet  Be sure to include plenty of vegetables, fruits, low-fat dairy products, and lean protein.  Do not eat a lot of foods high in solid fats, added sugars, or salt.  Get regular exercise. This is one of the most important things you can do for your health. ? Most adults should exercise for at least 150 minutes each week. The exercise should increase your heart rate and make you sweat (moderate-intensity exercise). ? Most adults should also do strengthening exercises at least twice a week. This is in addition to the moderate-intensity exercise.  Maintain a healthy weight  Body mass index (BMI) is a measurement that can be used to identify possible weight problems. It estimates body fat based on height and weight. Your health care provider can help determine your BMI and help you achieve or maintain a healthy weight.  For females 73 years of age and older: ? A BMI below 18.5 is considered underweight. ? A BMI of 18.5 to 24.9 is normal. ? A BMI of 25 to 29.9 is considered overweight. ? A BMI of 30 and above is considered obese.  Watch levels of cholesterol and blood lipids  You should start having your blood tested for lipids and cholesterol at 64 years of age, then have this test every 5 years.  You may need to have your cholesterol levels checked more often if: ? Your  lipid or cholesterol levels are high. ? You are older than 64 years of age. ? You are at high risk for heart disease.  Cancer screening Lung Cancer  Lung cancer screening is recommended for adults 27-87 years old who are at high risk for lung cancer because of a history of smoking.  A yearly low-dose CT scan of the lungs is recommended for people who: ? Currently smoke. ? Have quit within the past 15 years. ? Have at least a 30-pack-year history of smoking. A pack year is smoking an average of one pack of cigarettes a day for 1 year.  Yearly screening should continue until it has been 15 years since you  quit.  Yearly screening should stop if you develop a health problem that would prevent you from having lung cancer treatment.  Breast Cancer  Practice breast self-awareness. This means understanding how your breasts normally appear and feel.  It also means doing regular breast self-exams. Let your health care provider know about any changes, no matter how small.  If you are in your 20s or 30s, you should have a clinical breast exam (CBE) by a health care provider every 1-3 years as part of a regular health exam.  If you are 21 or older, have a CBE every year. Also consider having a breast X-ray (mammogram) every year.  If you have a family history of breast cancer, talk to your health care provider about genetic screening.  If you are at high risk for breast cancer, talk to your health care provider about having an MRI and a mammogram every year.  Breast cancer gene (BRCA) assessment is recommended for women who have family members with BRCA-related cancers. BRCA-related cancers include: ? Breast. ? Ovarian. ? Tubal. ? Peritoneal cancers.  Results of the assessment will determine the need for genetic counseling and BRCA1 and BRCA2 testing.  Cervical Cancer Your health care provider may recommend that you be screened regularly for cancer of the pelvic organs (ovaries, uterus, and vagina). This screening involves a pelvic examination, including checking for microscopic changes to the surface of your cervix (Pap test). You may be encouraged to have this screening done every 3 years, beginning at age 67.  For women ages 24-65, health care providers may recommend pelvic exams and Pap testing every 3 years, or they may recommend the Pap and pelvic exam, combined with testing for human papilloma virus (HPV), every 5 years. Some types of HPV increase your risk of cervical cancer. Testing for HPV may also be done on women of any age with unclear Pap test results.  Other health care providers  may not recommend any screening for nonpregnant women who are considered low risk for pelvic cancer and who do not have symptoms. Ask your health care provider if a screening pelvic exam is right for you.  If you have had past treatment for cervical cancer or a condition that could lead to cancer, you need Pap tests and screening for cancer for at least 20 years after your treatment. If Pap tests have been discontinued, your risk factors (such as having a new sexual partner) need to be reassessed to determine if screening should resume. Some women have medical problems that increase the chance of getting cervical cancer. In these cases, your health care provider may recommend more frequent screening and Pap tests.  Colorectal Cancer  This type of cancer can be detected and often prevented.  Routine colorectal cancer screening usually begins at 64 years of  age and continues through 64 years of age.  Your health care provider may recommend screening at an earlier age if you have risk factors for colon cancer.  Your health care provider may also recommend using home test kits to check for hidden blood in the stool.  A small camera at the end of a tube can be used to examine your colon directly (sigmoidoscopy or colonoscopy). This is done to check for the earliest forms of colorectal cancer.  Routine screening usually begins at age 81.  Direct examination of the colon should be repeated every 5-10 years through 64 years of age. However, you may need to be screened more often if early forms of precancerous polyps or small growths are found.  Skin Cancer  Check your skin from head to toe regularly.  Tell your health care provider about any new moles or changes in moles, especially if there is a change in a mole's shape or color.  Also tell your health care provider if you have a mole that is larger than the size of a pencil eraser.  Always use sunscreen. Apply sunscreen liberally and repeatedly  throughout the day.  Protect yourself by wearing long sleeves, pants, a wide-brimmed hat, and sunglasses whenever you are outside.  Heart disease, diabetes, and high blood pressure  High blood pressure causes heart disease and increases the risk of stroke. High blood pressure is more likely to develop in: ? People who have blood pressure in the high end of the normal range (130-139/85-89 mm Hg). ? People who are overweight or obese. ? People who are African American.  If you are 57-6 years of age, have your blood pressure checked every 3-5 years. If you are 52 years of age or older, have your blood pressure checked every year. You should have your blood pressure measured twice-once when you are at a hospital or clinic, and once when you are not at a hospital or clinic. Record the average of the two measurements. To check your blood pressure when you are not at a hospital or clinic, you can use: ? An automated blood pressure machine at a pharmacy. ? A home blood pressure monitor.  If you are between 19 years and 3 years old, ask your health care provider if you should take aspirin to prevent strokes.  Have regular diabetes screenings. This involves taking a blood sample to check your fasting blood sugar level. ? If you are at a normal weight and have a low risk for diabetes, have this test once every three years after 64 years of age. ? If you are overweight and have a high risk for diabetes, consider being tested at a younger age or more often. Preventing infection Hepatitis B  If you have a higher risk for hepatitis B, you should be screened for this virus. You are considered at high risk for hepatitis B if: ? You were born in a country where hepatitis B is common. Ask your health care provider which countries are considered high risk. ? Your parents were born in a high-risk country, and you have not been immunized against hepatitis B (hepatitis B vaccine). ? You have HIV or AIDS. ? You  use needles to inject street drugs. ? You live with someone who has hepatitis B. ? You have had sex with someone who has hepatitis B. ? You get hemodialysis treatment. ? You take certain medicines for conditions, including cancer, organ transplantation, and autoimmune conditions.  Hepatitis C  Blood testing  is recommended for: ? Everyone born from [birth year] through 1967. ? Anyone with known risk factors for hepatitis C.  Sexually transmitted infections (STIs)  You should be screened for sexually transmitted infections (STIs) including gonorrhea and chlamydia if: ? You are sexually active and are younger than 64 years of age. ? You are older than 64 years of age and your health care provider tells you that you are at risk for this type of infection. ? Your sexual activity has changed since you were last screened and you are at an increased risk for chlamydia or gonorrhea. Ask your health care provider if you are at risk.  If you do not have HIV, but are at risk, it may be recommended that you take a prescription medicine daily to prevent HIV infection. This is called pre-exposure prophylaxis (PrEP). You are considered at risk if: ? You are sexually active and do not regularly use condoms or know the HIV status of your partner(s). ? You take drugs by injection. ? You are sexually active with a partner who has HIV.  Talk with your health care provider about whether you are at high risk of being infected with HIV. If you choose to begin PrEP, you should first be tested for HIV. You should then be tested every 3 months for as long as you are taking PrEP. Pregnancy  If you are premenopausal and you may become pregnant, ask your health care provider about preconception counseling.  If you may become pregnant, take 400 to 800 micrograms (mcg) of folic acid every day.  If you want to prevent pregnancy, talk to your health care provider about birth control (contraception). Osteoporosis and  menopause  Osteoporosis is a disease in which the bones lose minerals and strength with aging. This can result in serious bone fractures. Your risk for osteoporosis can be identified using a bone density scan.  If you are 48 years of age or older, or if you are at risk for osteoporosis and fractures, ask your health care provider if you should be screened.  Ask your health care provider whether you should take a calcium or vitamin D supplement to lower your risk for osteoporosis.  Menopause may have certain physical symptoms and risks.  Hormone replacement therapy may reduce some of these symptoms and risks. Talk to your health care provider about whether hormone replacement therapy is right for you. Follow these instructions at home:  Schedule regular health, dental, and eye exams.  Stay current with your immunizations.  Do not use any tobacco products including cigarettes, chewing tobacco, or electronic cigarettes.  If you are pregnant, do not drink alcohol.  If you are breastfeeding, limit how much and how often you drink alcohol.  Limit alcohol intake to no more than 1 drink per day for nonpregnant women. One drink equals 12 ounces of beer, 5 ounces of wine, or 1 ounces of hard liquor.  Do not use street drugs.  Do not share needles.  Ask your health care provider for help if you need support or information about quitting drugs.  Tell your health care provider if you often feel depressed.  Tell your health care provider if you have ever been abused or do not feel safe at home. This information is not intended to replace advice given to you by your health care provider. Make sure you discuss any questions you have with your health care provider. Document Released: 12/26/2010 Document Revised: 11/18/2015 Document Reviewed: 03/16/2015 Elsevier Interactive Patient Education  2018 [employer].

## 2018-04-03 DIAGNOSIS — D631 Anemia in chronic kidney disease: Secondary | ICD-10-CM | POA: Diagnosis not present

## 2018-04-03 DIAGNOSIS — E119 Type 2 diabetes mellitus without complications: Secondary | ICD-10-CM | POA: Diagnosis not present

## 2018-04-03 DIAGNOSIS — N186 End stage renal disease: Secondary | ICD-10-CM | POA: Diagnosis not present

## 2018-04-03 DIAGNOSIS — Z23 Encounter for immunization: Secondary | ICD-10-CM | POA: Diagnosis not present

## 2018-04-03 DIAGNOSIS — D509 Iron deficiency anemia, unspecified: Secondary | ICD-10-CM | POA: Diagnosis not present

## 2018-04-03 DIAGNOSIS — N2581 Secondary hyperparathyroidism of renal origin: Secondary | ICD-10-CM | POA: Diagnosis not present

## 2018-04-04 DIAGNOSIS — N186 End stage renal disease: Secondary | ICD-10-CM | POA: Diagnosis not present

## 2018-04-04 DIAGNOSIS — D509 Iron deficiency anemia, unspecified: Secondary | ICD-10-CM | POA: Diagnosis not present

## 2018-04-04 DIAGNOSIS — N2581 Secondary hyperparathyroidism of renal origin: Secondary | ICD-10-CM | POA: Diagnosis not present

## 2018-04-04 DIAGNOSIS — Z23 Encounter for immunization: Secondary | ICD-10-CM | POA: Diagnosis not present

## 2018-04-04 DIAGNOSIS — D631 Anemia in chronic kidney disease: Secondary | ICD-10-CM | POA: Diagnosis not present

## 2018-04-05 DIAGNOSIS — N186 End stage renal disease: Secondary | ICD-10-CM | POA: Diagnosis not present

## 2018-04-06 DIAGNOSIS — N186 End stage renal disease: Secondary | ICD-10-CM | POA: Diagnosis not present

## 2018-04-07 DIAGNOSIS — N186 End stage renal disease: Secondary | ICD-10-CM | POA: Diagnosis not present

## 2018-04-08 DIAGNOSIS — N186 End stage renal disease: Secondary | ICD-10-CM | POA: Diagnosis not present

## 2018-04-09 DIAGNOSIS — N186 End stage renal disease: Secondary | ICD-10-CM | POA: Diagnosis not present

## 2018-04-10 DIAGNOSIS — N186 End stage renal disease: Secondary | ICD-10-CM | POA: Diagnosis not present

## 2018-04-11 DIAGNOSIS — N186 End stage renal disease: Secondary | ICD-10-CM | POA: Diagnosis not present

## 2018-04-12 DIAGNOSIS — N186 End stage renal disease: Secondary | ICD-10-CM | POA: Diagnosis not present

## 2018-04-13 DIAGNOSIS — N186 End stage renal disease: Secondary | ICD-10-CM | POA: Diagnosis not present

## 2018-04-14 DIAGNOSIS — N186 End stage renal disease: Secondary | ICD-10-CM | POA: Diagnosis not present

## 2018-04-15 DIAGNOSIS — N186 End stage renal disease: Secondary | ICD-10-CM | POA: Diagnosis not present

## 2018-04-16 DIAGNOSIS — N186 End stage renal disease: Secondary | ICD-10-CM | POA: Diagnosis not present

## 2018-04-17 DIAGNOSIS — N186 End stage renal disease: Secondary | ICD-10-CM | POA: Diagnosis not present

## 2018-04-18 DIAGNOSIS — N186 End stage renal disease: Secondary | ICD-10-CM | POA: Diagnosis not present

## 2018-04-19 DIAGNOSIS — N186 End stage renal disease: Secondary | ICD-10-CM | POA: Diagnosis not present

## 2018-04-20 DIAGNOSIS — N186 End stage renal disease: Secondary | ICD-10-CM | POA: Diagnosis not present

## 2018-04-21 DIAGNOSIS — N186 End stage renal disease: Secondary | ICD-10-CM | POA: Diagnosis not present

## 2018-04-22 ENCOUNTER — Ambulatory Visit: Payer: Self-pay | Admitting: Podiatry

## 2018-04-22 DIAGNOSIS — N186 End stage renal disease: Secondary | ICD-10-CM | POA: Diagnosis not present

## 2018-04-23 DIAGNOSIS — N186 End stage renal disease: Secondary | ICD-10-CM | POA: Diagnosis not present

## 2018-04-24 DIAGNOSIS — N186 End stage renal disease: Secondary | ICD-10-CM | POA: Diagnosis not present

## 2018-04-25 ENCOUNTER — Ambulatory Visit: Payer: Self-pay | Admitting: Podiatry

## 2018-04-25 DIAGNOSIS — Z992 Dependence on renal dialysis: Secondary | ICD-10-CM | POA: Diagnosis not present

## 2018-04-25 DIAGNOSIS — N186 End stage renal disease: Secondary | ICD-10-CM | POA: Diagnosis not present

## 2018-04-26 DIAGNOSIS — N2581 Secondary hyperparathyroidism of renal origin: Secondary | ICD-10-CM | POA: Diagnosis not present

## 2018-04-26 DIAGNOSIS — N186 End stage renal disease: Secondary | ICD-10-CM | POA: Diagnosis not present

## 2018-04-26 DIAGNOSIS — D631 Anemia in chronic kidney disease: Secondary | ICD-10-CM | POA: Diagnosis not present

## 2018-04-26 DIAGNOSIS — D509 Iron deficiency anemia, unspecified: Secondary | ICD-10-CM | POA: Diagnosis not present

## 2018-04-27 DIAGNOSIS — N2581 Secondary hyperparathyroidism of renal origin: Secondary | ICD-10-CM | POA: Diagnosis not present

## 2018-04-27 DIAGNOSIS — D509 Iron deficiency anemia, unspecified: Secondary | ICD-10-CM | POA: Diagnosis not present

## 2018-04-27 DIAGNOSIS — D631 Anemia in chronic kidney disease: Secondary | ICD-10-CM | POA: Diagnosis not present

## 2018-04-27 DIAGNOSIS — N186 End stage renal disease: Secondary | ICD-10-CM | POA: Diagnosis not present

## 2018-04-28 DIAGNOSIS — D509 Iron deficiency anemia, unspecified: Secondary | ICD-10-CM | POA: Diagnosis not present

## 2018-04-28 DIAGNOSIS — N2581 Secondary hyperparathyroidism of renal origin: Secondary | ICD-10-CM | POA: Diagnosis not present

## 2018-04-28 DIAGNOSIS — D631 Anemia in chronic kidney disease: Secondary | ICD-10-CM | POA: Diagnosis not present

## 2018-04-28 DIAGNOSIS — N186 End stage renal disease: Secondary | ICD-10-CM | POA: Diagnosis not present

## 2018-04-29 DIAGNOSIS — N2581 Secondary hyperparathyroidism of renal origin: Secondary | ICD-10-CM | POA: Diagnosis not present

## 2018-04-29 DIAGNOSIS — N186 End stage renal disease: Secondary | ICD-10-CM | POA: Diagnosis not present

## 2018-04-29 DIAGNOSIS — D631 Anemia in chronic kidney disease: Secondary | ICD-10-CM | POA: Diagnosis not present

## 2018-04-29 DIAGNOSIS — D509 Iron deficiency anemia, unspecified: Secondary | ICD-10-CM | POA: Diagnosis not present

## 2018-04-30 DIAGNOSIS — D509 Iron deficiency anemia, unspecified: Secondary | ICD-10-CM | POA: Diagnosis not present

## 2018-04-30 DIAGNOSIS — N186 End stage renal disease: Secondary | ICD-10-CM | POA: Diagnosis not present

## 2018-04-30 DIAGNOSIS — N2581 Secondary hyperparathyroidism of renal origin: Secondary | ICD-10-CM | POA: Diagnosis not present

## 2018-04-30 DIAGNOSIS — D631 Anemia in chronic kidney disease: Secondary | ICD-10-CM | POA: Diagnosis not present

## 2018-05-01 DIAGNOSIS — D631 Anemia in chronic kidney disease: Secondary | ICD-10-CM | POA: Diagnosis not present

## 2018-05-01 DIAGNOSIS — D509 Iron deficiency anemia, unspecified: Secondary | ICD-10-CM | POA: Diagnosis not present

## 2018-05-01 DIAGNOSIS — N186 End stage renal disease: Secondary | ICD-10-CM | POA: Diagnosis not present

## 2018-05-01 DIAGNOSIS — N2581 Secondary hyperparathyroidism of renal origin: Secondary | ICD-10-CM | POA: Diagnosis not present

## 2018-05-01 DIAGNOSIS — E119 Type 2 diabetes mellitus without complications: Secondary | ICD-10-CM | POA: Diagnosis not present

## 2018-05-02 DIAGNOSIS — N2581 Secondary hyperparathyroidism of renal origin: Secondary | ICD-10-CM | POA: Diagnosis not present

## 2018-05-02 DIAGNOSIS — D631 Anemia in chronic kidney disease: Secondary | ICD-10-CM | POA: Diagnosis not present

## 2018-05-02 DIAGNOSIS — N186 End stage renal disease: Secondary | ICD-10-CM | POA: Diagnosis not present

## 2018-05-02 DIAGNOSIS — D509 Iron deficiency anemia, unspecified: Secondary | ICD-10-CM | POA: Diagnosis not present

## 2018-05-03 DIAGNOSIS — N2581 Secondary hyperparathyroidism of renal origin: Secondary | ICD-10-CM | POA: Diagnosis not present

## 2018-05-03 DIAGNOSIS — N186 End stage renal disease: Secondary | ICD-10-CM | POA: Diagnosis not present

## 2018-05-03 DIAGNOSIS — D631 Anemia in chronic kidney disease: Secondary | ICD-10-CM | POA: Diagnosis not present

## 2018-05-03 DIAGNOSIS — D509 Iron deficiency anemia, unspecified: Secondary | ICD-10-CM | POA: Diagnosis not present

## 2018-05-04 DIAGNOSIS — D631 Anemia in chronic kidney disease: Secondary | ICD-10-CM | POA: Diagnosis not present

## 2018-05-04 DIAGNOSIS — D509 Iron deficiency anemia, unspecified: Secondary | ICD-10-CM | POA: Diagnosis not present

## 2018-05-04 DIAGNOSIS — N186 End stage renal disease: Secondary | ICD-10-CM | POA: Diagnosis not present

## 2018-05-04 DIAGNOSIS — N2581 Secondary hyperparathyroidism of renal origin: Secondary | ICD-10-CM | POA: Diagnosis not present

## 2018-05-05 DIAGNOSIS — N186 End stage renal disease: Secondary | ICD-10-CM | POA: Diagnosis not present

## 2018-05-05 DIAGNOSIS — N2581 Secondary hyperparathyroidism of renal origin: Secondary | ICD-10-CM | POA: Diagnosis not present

## 2018-05-05 DIAGNOSIS — D509 Iron deficiency anemia, unspecified: Secondary | ICD-10-CM | POA: Diagnosis not present

## 2018-05-05 DIAGNOSIS — D631 Anemia in chronic kidney disease: Secondary | ICD-10-CM | POA: Diagnosis not present

## 2018-05-06 ENCOUNTER — Ambulatory Visit: Payer: Self-pay | Admitting: Podiatry

## 2018-05-06 DIAGNOSIS — N186 End stage renal disease: Secondary | ICD-10-CM | POA: Diagnosis not present

## 2018-05-07 DIAGNOSIS — N186 End stage renal disease: Secondary | ICD-10-CM | POA: Diagnosis not present

## 2018-05-08 DIAGNOSIS — N186 End stage renal disease: Secondary | ICD-10-CM | POA: Diagnosis not present

## 2018-05-09 DIAGNOSIS — N186 End stage renal disease: Secondary | ICD-10-CM | POA: Diagnosis not present

## 2018-05-10 DIAGNOSIS — N186 End stage renal disease: Secondary | ICD-10-CM | POA: Diagnosis not present

## 2018-05-11 DIAGNOSIS — N186 End stage renal disease: Secondary | ICD-10-CM | POA: Diagnosis not present

## 2018-05-12 DIAGNOSIS — N186 End stage renal disease: Secondary | ICD-10-CM | POA: Diagnosis not present

## 2018-05-13 ENCOUNTER — Telehealth: Payer: Self-pay | Admitting: *Deleted

## 2018-05-13 DIAGNOSIS — N186 End stage renal disease: Secondary | ICD-10-CM | POA: Diagnosis not present

## 2018-05-13 NOTE — Telephone Encounter (Signed)
Called patient back in response to VM she left nurse. Patient stated that she would like to have a referral to an endocrinologist. Nurse LVM stating that Dr. Alain Marion would need to refer her and that nurse needed further information about why she would like to see an endocrinologist to better help her. Nurse requested that the patient call her back and left her contact number. Nurse will also attempt to call the patient back at a later date.

## 2018-05-14 DIAGNOSIS — N186 End stage renal disease: Secondary | ICD-10-CM | POA: Diagnosis not present

## 2018-05-14 NOTE — Telephone Encounter (Signed)
Nurse called and LVM for patient to call her back regarding request to be referred to an endocrinologist. Nurse's contact number was provided during VM.

## 2018-05-15 DIAGNOSIS — N186 End stage renal disease: Secondary | ICD-10-CM | POA: Diagnosis not present

## 2018-05-16 DIAGNOSIS — N186 End stage renal disease: Secondary | ICD-10-CM | POA: Diagnosis not present

## 2018-05-17 DIAGNOSIS — N186 End stage renal disease: Secondary | ICD-10-CM | POA: Diagnosis not present

## 2018-05-18 DIAGNOSIS — N186 End stage renal disease: Secondary | ICD-10-CM | POA: Diagnosis not present

## 2018-05-19 DIAGNOSIS — N186 End stage renal disease: Secondary | ICD-10-CM | POA: Diagnosis not present

## 2018-05-20 DIAGNOSIS — N186 End stage renal disease: Secondary | ICD-10-CM | POA: Diagnosis not present

## 2018-05-21 DIAGNOSIS — N186 End stage renal disease: Secondary | ICD-10-CM | POA: Diagnosis not present

## 2018-05-22 DIAGNOSIS — N186 End stage renal disease: Secondary | ICD-10-CM | POA: Diagnosis not present

## 2018-05-23 DIAGNOSIS — N186 End stage renal disease: Secondary | ICD-10-CM | POA: Diagnosis not present

## 2018-05-24 DIAGNOSIS — N186 End stage renal disease: Secondary | ICD-10-CM | POA: Diagnosis not present

## 2018-05-25 DIAGNOSIS — Z992 Dependence on renal dialysis: Secondary | ICD-10-CM | POA: Diagnosis not present

## 2018-05-25 DIAGNOSIS — N186 End stage renal disease: Secondary | ICD-10-CM | POA: Diagnosis not present

## 2018-05-26 DIAGNOSIS — N2581 Secondary hyperparathyroidism of renal origin: Secondary | ICD-10-CM | POA: Diagnosis not present

## 2018-05-26 DIAGNOSIS — D509 Iron deficiency anemia, unspecified: Secondary | ICD-10-CM | POA: Diagnosis not present

## 2018-05-26 DIAGNOSIS — N186 End stage renal disease: Secondary | ICD-10-CM | POA: Diagnosis not present

## 2018-05-26 DIAGNOSIS — D631 Anemia in chronic kidney disease: Secondary | ICD-10-CM | POA: Diagnosis not present

## 2018-05-27 DIAGNOSIS — D631 Anemia in chronic kidney disease: Secondary | ICD-10-CM | POA: Diagnosis not present

## 2018-05-27 DIAGNOSIS — N2581 Secondary hyperparathyroidism of renal origin: Secondary | ICD-10-CM | POA: Diagnosis not present

## 2018-05-27 DIAGNOSIS — N186 End stage renal disease: Secondary | ICD-10-CM | POA: Diagnosis not present

## 2018-05-27 DIAGNOSIS — D509 Iron deficiency anemia, unspecified: Secondary | ICD-10-CM | POA: Diagnosis not present

## 2018-05-28 DIAGNOSIS — D509 Iron deficiency anemia, unspecified: Secondary | ICD-10-CM | POA: Diagnosis not present

## 2018-05-28 DIAGNOSIS — N186 End stage renal disease: Secondary | ICD-10-CM | POA: Diagnosis not present

## 2018-05-28 DIAGNOSIS — D631 Anemia in chronic kidney disease: Secondary | ICD-10-CM | POA: Diagnosis not present

## 2018-05-28 DIAGNOSIS — N2581 Secondary hyperparathyroidism of renal origin: Secondary | ICD-10-CM | POA: Diagnosis not present

## 2018-05-29 DIAGNOSIS — N186 End stage renal disease: Secondary | ICD-10-CM | POA: Diagnosis not present

## 2018-05-29 DIAGNOSIS — D509 Iron deficiency anemia, unspecified: Secondary | ICD-10-CM | POA: Diagnosis not present

## 2018-05-29 DIAGNOSIS — N2581 Secondary hyperparathyroidism of renal origin: Secondary | ICD-10-CM | POA: Diagnosis not present

## 2018-05-29 DIAGNOSIS — D631 Anemia in chronic kidney disease: Secondary | ICD-10-CM | POA: Diagnosis not present

## 2018-05-30 DIAGNOSIS — D509 Iron deficiency anemia, unspecified: Secondary | ICD-10-CM | POA: Diagnosis not present

## 2018-05-30 DIAGNOSIS — N2581 Secondary hyperparathyroidism of renal origin: Secondary | ICD-10-CM | POA: Diagnosis not present

## 2018-05-30 DIAGNOSIS — E119 Type 2 diabetes mellitus without complications: Secondary | ICD-10-CM | POA: Diagnosis not present

## 2018-05-30 DIAGNOSIS — N186 End stage renal disease: Secondary | ICD-10-CM | POA: Diagnosis not present

## 2018-05-30 DIAGNOSIS — D631 Anemia in chronic kidney disease: Secondary | ICD-10-CM | POA: Diagnosis not present

## 2018-05-31 DIAGNOSIS — N186 End stage renal disease: Secondary | ICD-10-CM | POA: Diagnosis not present

## 2018-05-31 DIAGNOSIS — D631 Anemia in chronic kidney disease: Secondary | ICD-10-CM | POA: Diagnosis not present

## 2018-05-31 DIAGNOSIS — N2581 Secondary hyperparathyroidism of renal origin: Secondary | ICD-10-CM | POA: Diagnosis not present

## 2018-05-31 DIAGNOSIS — D509 Iron deficiency anemia, unspecified: Secondary | ICD-10-CM | POA: Diagnosis not present

## 2018-06-01 DIAGNOSIS — D509 Iron deficiency anemia, unspecified: Secondary | ICD-10-CM | POA: Diagnosis not present

## 2018-06-01 DIAGNOSIS — N2581 Secondary hyperparathyroidism of renal origin: Secondary | ICD-10-CM | POA: Diagnosis not present

## 2018-06-01 DIAGNOSIS — N186 End stage renal disease: Secondary | ICD-10-CM | POA: Diagnosis not present

## 2018-06-01 DIAGNOSIS — D631 Anemia in chronic kidney disease: Secondary | ICD-10-CM | POA: Diagnosis not present

## 2018-06-01 IMAGING — US US EXTREM LOW*L* LIMITED
1 series · 14 of 16 positions shown · non-contrast
Comparison: None.

CLINICAL DATA: Ganglion cyst. Lump on the LEFT LATERAL LOWER leg
for 7 weeks. Masses occasionally tender and varies in size.

EXAM:
ULTRASOUND LEFT LOWER EXTREMITY LIMITED
TECHNIQUE: Ultrasound examination of the lower extremity soft tissues was
performed in the area of clinical concern.

[Series 1: us extrem low*left* limited · 0.04mm/px · 14 of 16 slices shown]
[im 1/16]
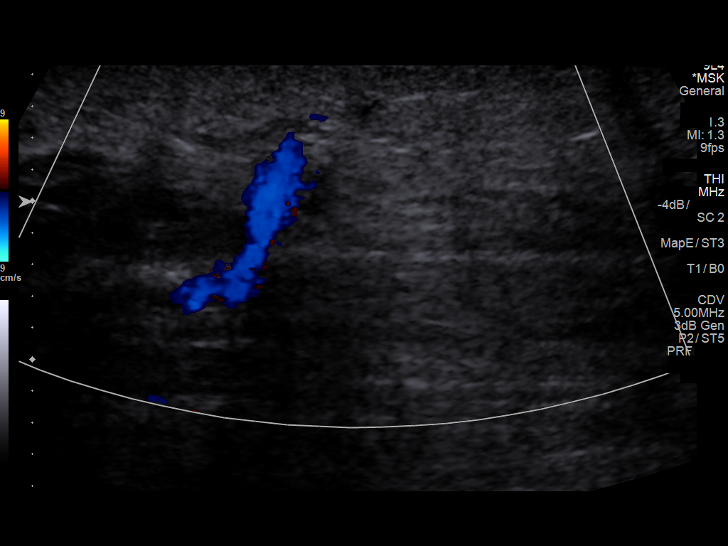
[im 2/16]
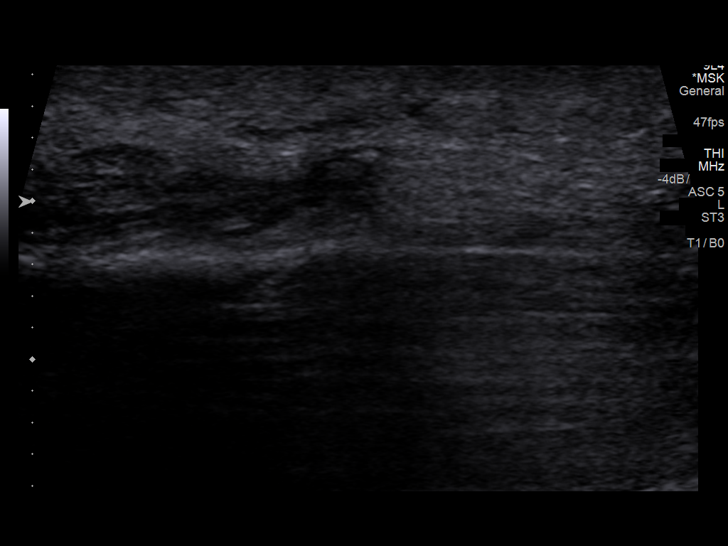
[im 3/16]
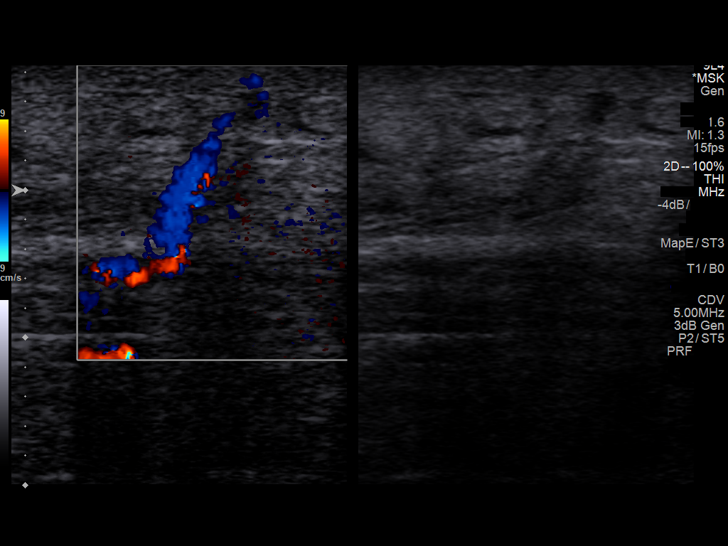
[im 5/16]
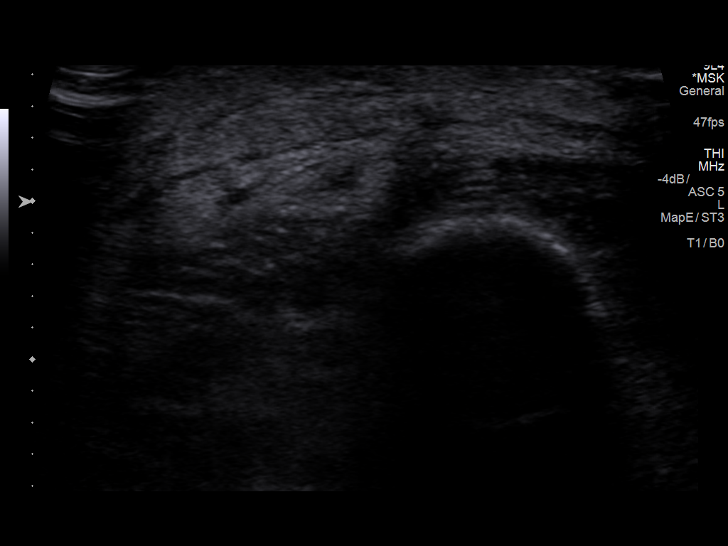
[im 6/16]
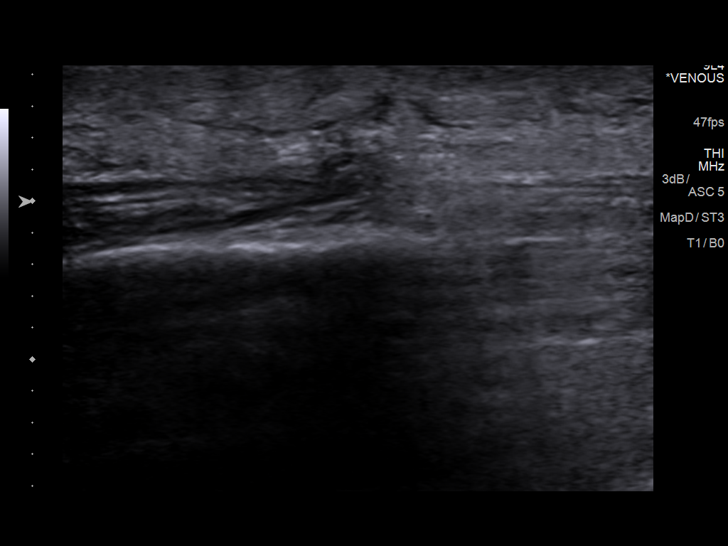
[im 7/16]
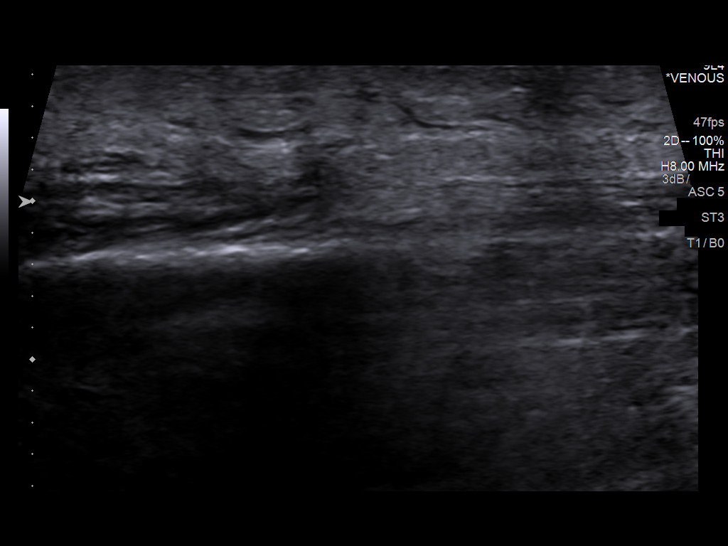
[im 8/16]
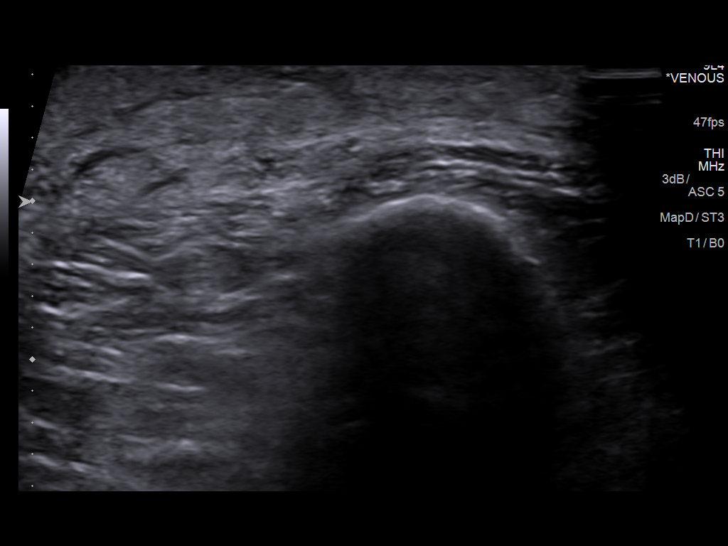
[im 9/16]
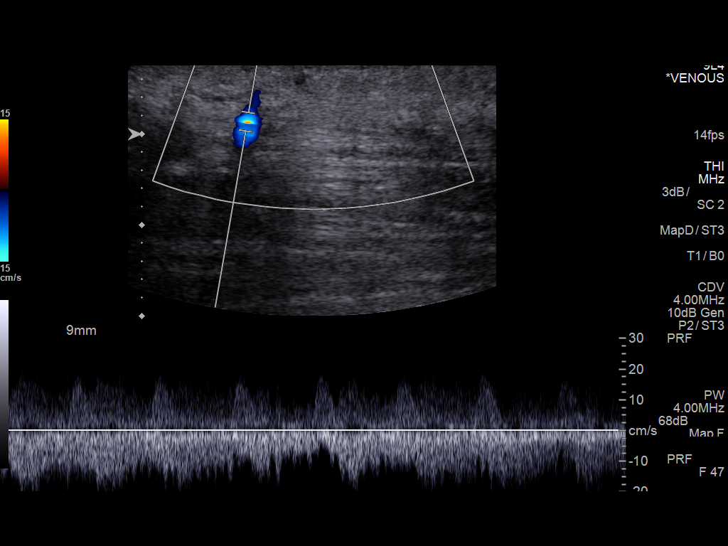
[im 10/16]
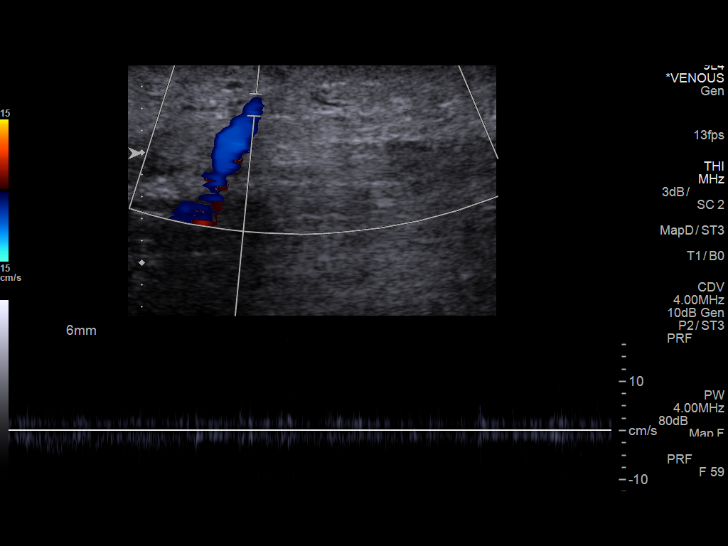
[im 11/16]
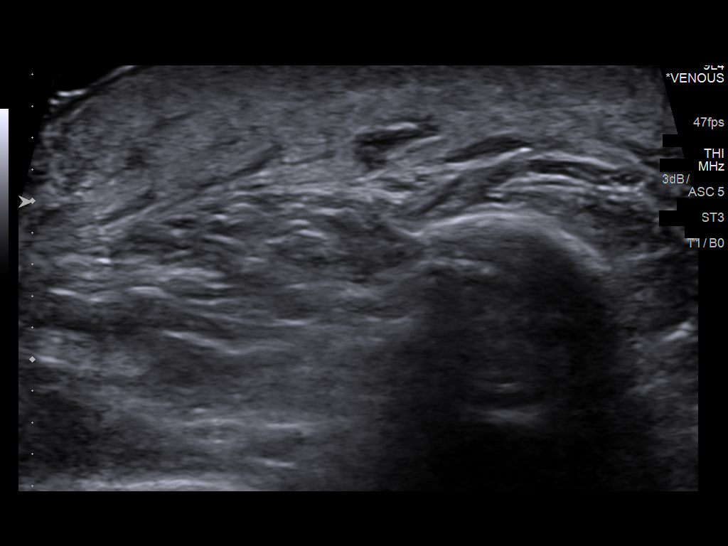
[im 13/16]
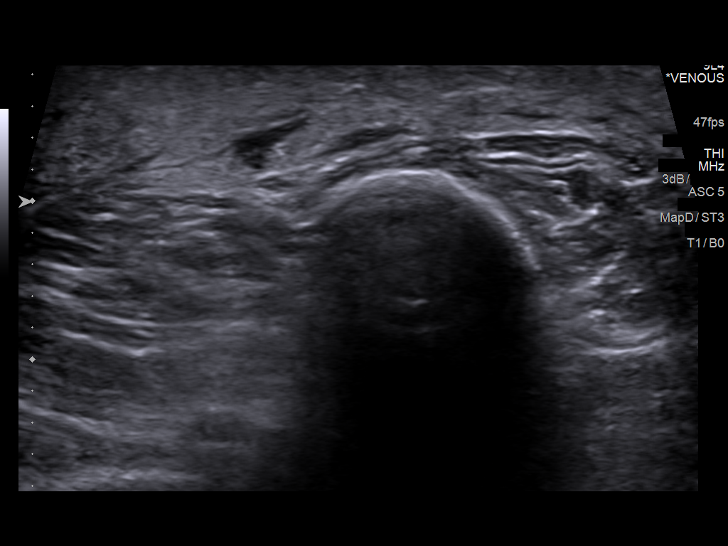
[im 14/16]
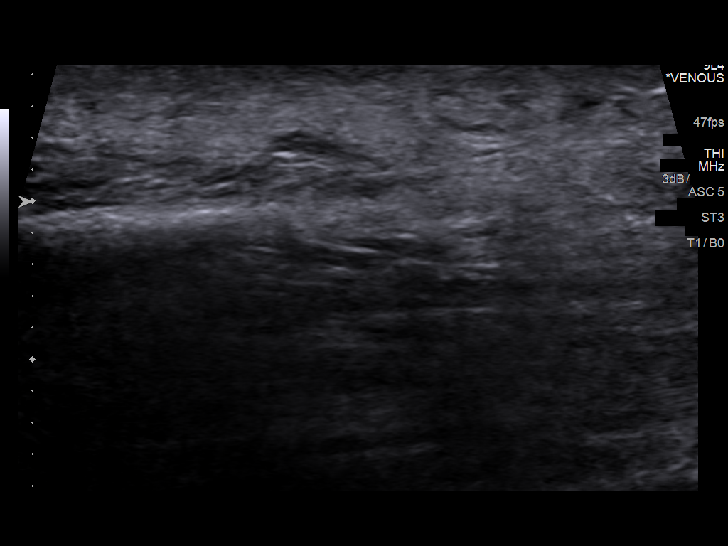
[im 15/16]
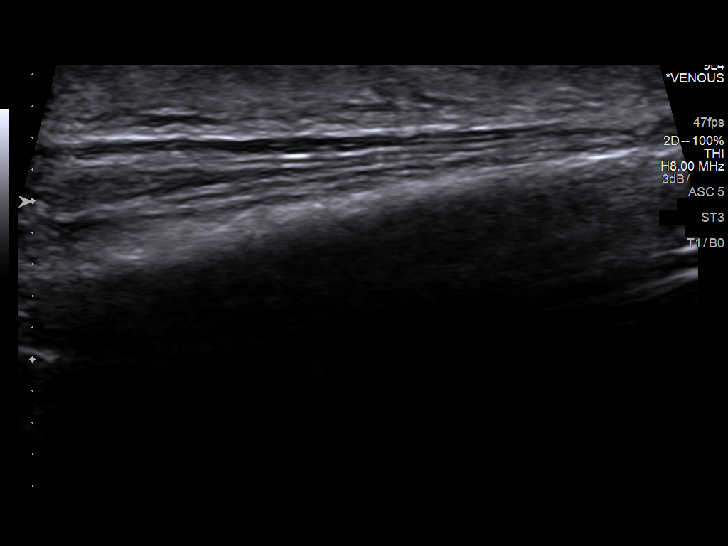
[im 16/16]
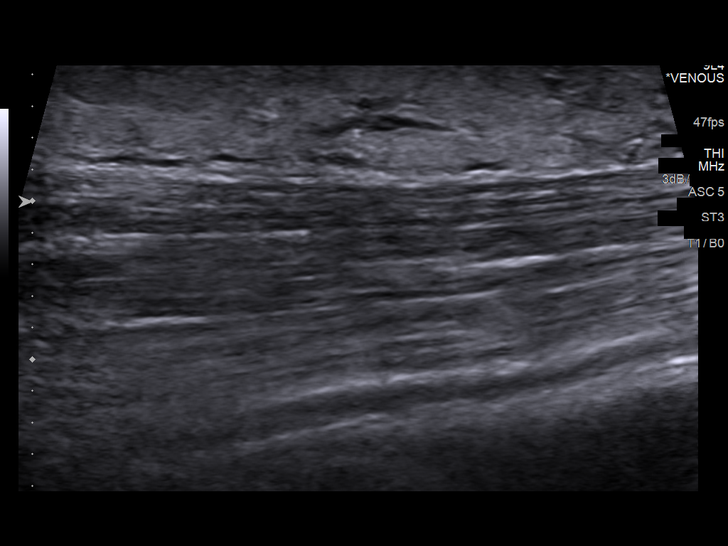

[14 of 16 positions shown; findings below may reference images not displayed]

FINDINGS: Soft Tissue Structures: There is mild edema within the subcutaneous
tissue in the area of patient's concern. No mass or fluid
collection. No cystic abnormality.
IMPRESSION: Mild soft tissue edema.  No mass.

## 2018-06-02 DIAGNOSIS — D631 Anemia in chronic kidney disease: Secondary | ICD-10-CM | POA: Diagnosis not present

## 2018-06-02 DIAGNOSIS — D509 Iron deficiency anemia, unspecified: Secondary | ICD-10-CM | POA: Diagnosis not present

## 2018-06-02 DIAGNOSIS — N2581 Secondary hyperparathyroidism of renal origin: Secondary | ICD-10-CM | POA: Diagnosis not present

## 2018-06-02 DIAGNOSIS — N186 End stage renal disease: Secondary | ICD-10-CM | POA: Diagnosis not present

## 2018-06-03 DIAGNOSIS — D509 Iron deficiency anemia, unspecified: Secondary | ICD-10-CM | POA: Diagnosis not present

## 2018-06-03 DIAGNOSIS — N186 End stage renal disease: Secondary | ICD-10-CM | POA: Diagnosis not present

## 2018-06-03 DIAGNOSIS — D631 Anemia in chronic kidney disease: Secondary | ICD-10-CM | POA: Diagnosis not present

## 2018-06-03 DIAGNOSIS — N2581 Secondary hyperparathyroidism of renal origin: Secondary | ICD-10-CM | POA: Diagnosis not present

## 2018-06-04 DIAGNOSIS — D509 Iron deficiency anemia, unspecified: Secondary | ICD-10-CM | POA: Diagnosis not present

## 2018-06-04 DIAGNOSIS — N186 End stage renal disease: Secondary | ICD-10-CM | POA: Diagnosis not present

## 2018-06-04 DIAGNOSIS — N2581 Secondary hyperparathyroidism of renal origin: Secondary | ICD-10-CM | POA: Diagnosis not present

## 2018-06-04 DIAGNOSIS — D631 Anemia in chronic kidney disease: Secondary | ICD-10-CM | POA: Diagnosis not present

## 2018-06-05 DIAGNOSIS — N186 End stage renal disease: Secondary | ICD-10-CM | POA: Diagnosis not present

## 2018-06-05 DIAGNOSIS — N2581 Secondary hyperparathyroidism of renal origin: Secondary | ICD-10-CM | POA: Diagnosis not present

## 2018-06-06 DIAGNOSIS — N2581 Secondary hyperparathyroidism of renal origin: Secondary | ICD-10-CM | POA: Diagnosis not present

## 2018-06-06 DIAGNOSIS — N186 End stage renal disease: Secondary | ICD-10-CM | POA: Diagnosis not present

## 2018-06-07 DIAGNOSIS — Z4932 Encounter for adequacy testing for peritoneal dialysis: Secondary | ICD-10-CM | POA: Diagnosis not present

## 2018-06-07 DIAGNOSIS — N2581 Secondary hyperparathyroidism of renal origin: Secondary | ICD-10-CM | POA: Diagnosis not present

## 2018-06-07 DIAGNOSIS — N186 End stage renal disease: Secondary | ICD-10-CM | POA: Diagnosis not present

## 2018-06-08 DIAGNOSIS — N2581 Secondary hyperparathyroidism of renal origin: Secondary | ICD-10-CM | POA: Diagnosis not present

## 2018-06-08 DIAGNOSIS — N186 End stage renal disease: Secondary | ICD-10-CM | POA: Diagnosis not present

## 2018-06-09 DIAGNOSIS — N2581 Secondary hyperparathyroidism of renal origin: Secondary | ICD-10-CM | POA: Diagnosis not present

## 2018-06-09 DIAGNOSIS — N186 End stage renal disease: Secondary | ICD-10-CM | POA: Diagnosis not present

## 2018-06-10 ENCOUNTER — Other Ambulatory Visit: Payer: Self-pay | Admitting: Internal Medicine

## 2018-06-10 DIAGNOSIS — N186 End stage renal disease: Secondary | ICD-10-CM | POA: Diagnosis not present

## 2018-06-10 DIAGNOSIS — N2581 Secondary hyperparathyroidism of renal origin: Secondary | ICD-10-CM | POA: Diagnosis not present

## 2018-06-11 ENCOUNTER — Other Ambulatory Visit: Payer: Self-pay | Admitting: Internal Medicine

## 2018-06-11 DIAGNOSIS — N2581 Secondary hyperparathyroidism of renal origin: Secondary | ICD-10-CM | POA: Diagnosis not present

## 2018-06-11 DIAGNOSIS — N186 End stage renal disease: Secondary | ICD-10-CM | POA: Diagnosis not present

## 2018-06-12 DIAGNOSIS — N2581 Secondary hyperparathyroidism of renal origin: Secondary | ICD-10-CM | POA: Diagnosis not present

## 2018-06-12 DIAGNOSIS — N186 End stage renal disease: Secondary | ICD-10-CM | POA: Diagnosis not present

## 2018-06-13 DIAGNOSIS — N186 End stage renal disease: Secondary | ICD-10-CM | POA: Diagnosis not present

## 2018-06-13 DIAGNOSIS — N2581 Secondary hyperparathyroidism of renal origin: Secondary | ICD-10-CM | POA: Diagnosis not present

## 2018-06-14 DIAGNOSIS — N2581 Secondary hyperparathyroidism of renal origin: Secondary | ICD-10-CM | POA: Diagnosis not present

## 2018-06-14 DIAGNOSIS — N186 End stage renal disease: Secondary | ICD-10-CM | POA: Diagnosis not present

## 2018-06-15 DIAGNOSIS — N186 End stage renal disease: Secondary | ICD-10-CM | POA: Diagnosis not present

## 2018-06-16 DIAGNOSIS — N186 End stage renal disease: Secondary | ICD-10-CM | POA: Diagnosis not present

## 2018-06-17 DIAGNOSIS — N186 End stage renal disease: Secondary | ICD-10-CM | POA: Diagnosis not present

## 2018-06-18 DIAGNOSIS — N186 End stage renal disease: Secondary | ICD-10-CM | POA: Diagnosis not present

## 2018-06-19 DIAGNOSIS — N186 End stage renal disease: Secondary | ICD-10-CM | POA: Diagnosis not present

## 2018-06-20 DIAGNOSIS — N186 End stage renal disease: Secondary | ICD-10-CM | POA: Diagnosis not present

## 2018-06-21 DIAGNOSIS — N186 End stage renal disease: Secondary | ICD-10-CM | POA: Diagnosis not present

## 2018-06-22 DIAGNOSIS — N186 End stage renal disease: Secondary | ICD-10-CM | POA: Diagnosis not present

## 2018-06-23 DIAGNOSIS — N186 End stage renal disease: Secondary | ICD-10-CM | POA: Diagnosis not present

## 2018-06-24 DIAGNOSIS — N186 End stage renal disease: Secondary | ICD-10-CM | POA: Diagnosis not present

## 2018-06-25 DIAGNOSIS — N186 End stage renal disease: Secondary | ICD-10-CM | POA: Diagnosis not present

## 2018-06-25 DIAGNOSIS — Z992 Dependence on renal dialysis: Secondary | ICD-10-CM | POA: Diagnosis not present

## 2018-06-26 DIAGNOSIS — D631 Anemia in chronic kidney disease: Secondary | ICD-10-CM | POA: Diagnosis not present

## 2018-06-26 DIAGNOSIS — N186 End stage renal disease: Secondary | ICD-10-CM | POA: Diagnosis not present

## 2018-06-26 DIAGNOSIS — N2581 Secondary hyperparathyroidism of renal origin: Secondary | ICD-10-CM | POA: Diagnosis not present

## 2018-06-26 DIAGNOSIS — D509 Iron deficiency anemia, unspecified: Secondary | ICD-10-CM | POA: Diagnosis not present

## 2018-06-27 DIAGNOSIS — N2581 Secondary hyperparathyroidism of renal origin: Secondary | ICD-10-CM | POA: Diagnosis not present

## 2018-06-27 DIAGNOSIS — E119 Type 2 diabetes mellitus without complications: Secondary | ICD-10-CM | POA: Diagnosis not present

## 2018-06-27 DIAGNOSIS — D509 Iron deficiency anemia, unspecified: Secondary | ICD-10-CM | POA: Diagnosis not present

## 2018-06-27 DIAGNOSIS — N186 End stage renal disease: Secondary | ICD-10-CM | POA: Diagnosis not present

## 2018-06-27 DIAGNOSIS — D631 Anemia in chronic kidney disease: Secondary | ICD-10-CM | POA: Diagnosis not present

## 2018-06-28 DIAGNOSIS — N186 End stage renal disease: Secondary | ICD-10-CM | POA: Diagnosis not present

## 2018-06-28 DIAGNOSIS — N2581 Secondary hyperparathyroidism of renal origin: Secondary | ICD-10-CM | POA: Diagnosis not present

## 2018-06-28 DIAGNOSIS — D509 Iron deficiency anemia, unspecified: Secondary | ICD-10-CM | POA: Diagnosis not present

## 2018-06-28 DIAGNOSIS — D631 Anemia in chronic kidney disease: Secondary | ICD-10-CM | POA: Diagnosis not present

## 2018-06-29 DIAGNOSIS — D509 Iron deficiency anemia, unspecified: Secondary | ICD-10-CM | POA: Diagnosis not present

## 2018-06-29 DIAGNOSIS — N186 End stage renal disease: Secondary | ICD-10-CM | POA: Diagnosis not present

## 2018-06-29 DIAGNOSIS — N2581 Secondary hyperparathyroidism of renal origin: Secondary | ICD-10-CM | POA: Diagnosis not present

## 2018-06-29 DIAGNOSIS — D631 Anemia in chronic kidney disease: Secondary | ICD-10-CM | POA: Diagnosis not present

## 2018-06-30 DIAGNOSIS — N186 End stage renal disease: Secondary | ICD-10-CM | POA: Diagnosis not present

## 2018-06-30 DIAGNOSIS — D509 Iron deficiency anemia, unspecified: Secondary | ICD-10-CM | POA: Diagnosis not present

## 2018-06-30 DIAGNOSIS — N2581 Secondary hyperparathyroidism of renal origin: Secondary | ICD-10-CM | POA: Diagnosis not present

## 2018-06-30 DIAGNOSIS — D631 Anemia in chronic kidney disease: Secondary | ICD-10-CM | POA: Diagnosis not present

## 2018-07-01 ENCOUNTER — Telehealth: Payer: Self-pay | Admitting: *Deleted

## 2018-07-01 DIAGNOSIS — D509 Iron deficiency anemia, unspecified: Secondary | ICD-10-CM | POA: Diagnosis not present

## 2018-07-01 DIAGNOSIS — D631 Anemia in chronic kidney disease: Secondary | ICD-10-CM | POA: Diagnosis not present

## 2018-07-01 DIAGNOSIS — N186 End stage renal disease: Secondary | ICD-10-CM | POA: Diagnosis not present

## 2018-07-01 DIAGNOSIS — E118 Type 2 diabetes mellitus with unspecified complications: Secondary | ICD-10-CM

## 2018-07-01 DIAGNOSIS — N2581 Secondary hyperparathyroidism of renal origin: Secondary | ICD-10-CM | POA: Diagnosis not present

## 2018-07-01 NOTE — Telephone Encounter (Signed)
Called patient in response to the [employer] she left nurse. Patient stated that her nephrologist requested that PCP refer her to Dr. Leeroy Bock; an endocrinologist who specializes with PD patients.

## 2018-07-02 DIAGNOSIS — N2581 Secondary hyperparathyroidism of renal origin: Secondary | ICD-10-CM | POA: Diagnosis not present

## 2018-07-02 DIAGNOSIS — D509 Iron deficiency anemia, unspecified: Secondary | ICD-10-CM | POA: Diagnosis not present

## 2018-07-02 DIAGNOSIS — N186 End stage renal disease: Secondary | ICD-10-CM | POA: Diagnosis not present

## 2018-07-02 DIAGNOSIS — D631 Anemia in chronic kidney disease: Secondary | ICD-10-CM | POA: Diagnosis not present

## 2018-07-02 NOTE — Telephone Encounter (Signed)
Ok Thx 

## 2018-07-03 DIAGNOSIS — N186 End stage renal disease: Secondary | ICD-10-CM | POA: Diagnosis not present

## 2018-07-03 DIAGNOSIS — N2581 Secondary hyperparathyroidism of renal origin: Secondary | ICD-10-CM | POA: Diagnosis not present

## 2018-07-03 DIAGNOSIS — D509 Iron deficiency anemia, unspecified: Secondary | ICD-10-CM | POA: Diagnosis not present

## 2018-07-03 DIAGNOSIS — D631 Anemia in chronic kidney disease: Secondary | ICD-10-CM | POA: Diagnosis not present

## 2018-07-04 DIAGNOSIS — N186 End stage renal disease: Secondary | ICD-10-CM | POA: Diagnosis not present

## 2018-07-04 DIAGNOSIS — N2581 Secondary hyperparathyroidism of renal origin: Secondary | ICD-10-CM | POA: Diagnosis not present

## 2018-07-04 DIAGNOSIS — D509 Iron deficiency anemia, unspecified: Secondary | ICD-10-CM | POA: Diagnosis not present

## 2018-07-04 DIAGNOSIS — D631 Anemia in chronic kidney disease: Secondary | ICD-10-CM | POA: Diagnosis not present

## 2018-07-05 DIAGNOSIS — N186 End stage renal disease: Secondary | ICD-10-CM | POA: Diagnosis not present

## 2018-07-05 DIAGNOSIS — D631 Anemia in chronic kidney disease: Secondary | ICD-10-CM | POA: Diagnosis not present

## 2018-07-05 DIAGNOSIS — D509 Iron deficiency anemia, unspecified: Secondary | ICD-10-CM | POA: Diagnosis not present

## 2018-07-05 DIAGNOSIS — N2581 Secondary hyperparathyroidism of renal origin: Secondary | ICD-10-CM | POA: Diagnosis not present

## 2018-07-06 DIAGNOSIS — N186 End stage renal disease: Secondary | ICD-10-CM | POA: Diagnosis not present

## 2018-07-07 DIAGNOSIS — N186 End stage renal disease: Secondary | ICD-10-CM | POA: Diagnosis not present

## 2018-07-08 DIAGNOSIS — N186 End stage renal disease: Secondary | ICD-10-CM | POA: Diagnosis not present

## 2018-07-09 DIAGNOSIS — N186 End stage renal disease: Secondary | ICD-10-CM | POA: Diagnosis not present

## 2018-07-10 DIAGNOSIS — N186 End stage renal disease: Secondary | ICD-10-CM | POA: Diagnosis not present

## 2018-07-11 DIAGNOSIS — N186 End stage renal disease: Secondary | ICD-10-CM | POA: Diagnosis not present

## 2018-07-12 DIAGNOSIS — N186 End stage renal disease: Secondary | ICD-10-CM | POA: Diagnosis not present

## 2018-07-13 DIAGNOSIS — N186 End stage renal disease: Secondary | ICD-10-CM | POA: Diagnosis not present

## 2018-07-14 DIAGNOSIS — N186 End stage renal disease: Secondary | ICD-10-CM | POA: Diagnosis not present

## 2018-07-15 DIAGNOSIS — N186 End stage renal disease: Secondary | ICD-10-CM | POA: Diagnosis not present

## 2018-07-15 NOTE — Telephone Encounter (Signed)
Can you please let pt know she will need an office visit with Dr. Alain Marion before the referral can be sent

## 2018-07-16 DIAGNOSIS — N186 End stage renal disease: Secondary | ICD-10-CM | POA: Diagnosis not present

## 2018-07-17 ENCOUNTER — Telehealth: Payer: Self-pay | Admitting: *Deleted

## 2018-07-17 DIAGNOSIS — N186 End stage renal disease: Secondary | ICD-10-CM | POA: Diagnosis not present

## 2018-07-17 NOTE — Telephone Encounter (Signed)
Spoke to patient in regards to referral request to Dr. Leeroy Bock to inform her that our referral representative states she will need to make an appointment to see Dr. Alain Marion as it has been since 3/19 from her previous visit with PCP. Patient verbalized understanding and stated that she will contact nephrologist to ask them to make the referral. If the nephrologist will not send the referral she will call nurse back to schedule a PCP visit.

## 2018-07-18 DIAGNOSIS — N186 End stage renal disease: Secondary | ICD-10-CM | POA: Diagnosis not present

## 2018-07-19 DIAGNOSIS — N186 End stage renal disease: Secondary | ICD-10-CM | POA: Diagnosis not present

## 2018-07-20 DIAGNOSIS — N186 End stage renal disease: Secondary | ICD-10-CM | POA: Diagnosis not present

## 2018-07-21 DIAGNOSIS — N186 End stage renal disease: Secondary | ICD-10-CM | POA: Diagnosis not present

## 2018-07-22 DIAGNOSIS — N186 End stage renal disease: Secondary | ICD-10-CM | POA: Diagnosis not present

## 2018-07-23 DIAGNOSIS — N186 End stage renal disease: Secondary | ICD-10-CM | POA: Diagnosis not present

## 2018-07-24 DIAGNOSIS — N186 End stage renal disease: Secondary | ICD-10-CM | POA: Diagnosis not present

## 2018-07-25 DIAGNOSIS — N186 End stage renal disease: Secondary | ICD-10-CM | POA: Diagnosis not present

## 2018-07-26 DIAGNOSIS — N186 End stage renal disease: Secondary | ICD-10-CM | POA: Diagnosis not present

## 2018-07-26 DIAGNOSIS — Z992 Dependence on renal dialysis: Secondary | ICD-10-CM | POA: Diagnosis not present

## 2018-07-27 DIAGNOSIS — N186 End stage renal disease: Secondary | ICD-10-CM | POA: Diagnosis not present

## 2018-07-27 DIAGNOSIS — D631 Anemia in chronic kidney disease: Secondary | ICD-10-CM | POA: Diagnosis not present

## 2018-07-27 DIAGNOSIS — N2581 Secondary hyperparathyroidism of renal origin: Secondary | ICD-10-CM | POA: Diagnosis not present

## 2018-07-27 DIAGNOSIS — D509 Iron deficiency anemia, unspecified: Secondary | ICD-10-CM | POA: Diagnosis not present

## 2018-07-28 DIAGNOSIS — D631 Anemia in chronic kidney disease: Secondary | ICD-10-CM | POA: Diagnosis not present

## 2018-07-28 DIAGNOSIS — N186 End stage renal disease: Secondary | ICD-10-CM | POA: Diagnosis not present

## 2018-07-28 DIAGNOSIS — D509 Iron deficiency anemia, unspecified: Secondary | ICD-10-CM | POA: Diagnosis not present

## 2018-07-28 DIAGNOSIS — N2581 Secondary hyperparathyroidism of renal origin: Secondary | ICD-10-CM | POA: Diagnosis not present

## 2018-07-29 DIAGNOSIS — D509 Iron deficiency anemia, unspecified: Secondary | ICD-10-CM | POA: Diagnosis not present

## 2018-07-29 DIAGNOSIS — N2581 Secondary hyperparathyroidism of renal origin: Secondary | ICD-10-CM | POA: Diagnosis not present

## 2018-07-29 DIAGNOSIS — N186 End stage renal disease: Secondary | ICD-10-CM | POA: Diagnosis not present

## 2018-07-29 DIAGNOSIS — D631 Anemia in chronic kidney disease: Secondary | ICD-10-CM | POA: Diagnosis not present

## 2018-07-30 DIAGNOSIS — N2581 Secondary hyperparathyroidism of renal origin: Secondary | ICD-10-CM | POA: Diagnosis not present

## 2018-07-30 DIAGNOSIS — D509 Iron deficiency anemia, unspecified: Secondary | ICD-10-CM | POA: Diagnosis not present

## 2018-07-30 DIAGNOSIS — D631 Anemia in chronic kidney disease: Secondary | ICD-10-CM | POA: Diagnosis not present

## 2018-07-30 DIAGNOSIS — N186 End stage renal disease: Secondary | ICD-10-CM | POA: Diagnosis not present

## 2018-07-31 DIAGNOSIS — D509 Iron deficiency anemia, unspecified: Secondary | ICD-10-CM | POA: Diagnosis not present

## 2018-07-31 DIAGNOSIS — N2581 Secondary hyperparathyroidism of renal origin: Secondary | ICD-10-CM | POA: Diagnosis not present

## 2018-07-31 DIAGNOSIS — N186 End stage renal disease: Secondary | ICD-10-CM | POA: Diagnosis not present

## 2018-07-31 DIAGNOSIS — D631 Anemia in chronic kidney disease: Secondary | ICD-10-CM | POA: Diagnosis not present

## 2018-08-01 DIAGNOSIS — N186 End stage renal disease: Secondary | ICD-10-CM | POA: Diagnosis not present

## 2018-08-01 DIAGNOSIS — D631 Anemia in chronic kidney disease: Secondary | ICD-10-CM | POA: Diagnosis not present

## 2018-08-01 DIAGNOSIS — N2581 Secondary hyperparathyroidism of renal origin: Secondary | ICD-10-CM | POA: Diagnosis not present

## 2018-08-01 DIAGNOSIS — D509 Iron deficiency anemia, unspecified: Secondary | ICD-10-CM | POA: Diagnosis not present

## 2018-08-02 DIAGNOSIS — N186 End stage renal disease: Secondary | ICD-10-CM | POA: Diagnosis not present

## 2018-08-02 DIAGNOSIS — D509 Iron deficiency anemia, unspecified: Secondary | ICD-10-CM | POA: Diagnosis not present

## 2018-08-02 DIAGNOSIS — D631 Anemia in chronic kidney disease: Secondary | ICD-10-CM | POA: Diagnosis not present

## 2018-08-02 DIAGNOSIS — N2581 Secondary hyperparathyroidism of renal origin: Secondary | ICD-10-CM | POA: Diagnosis not present

## 2018-08-03 DIAGNOSIS — N2581 Secondary hyperparathyroidism of renal origin: Secondary | ICD-10-CM | POA: Diagnosis not present

## 2018-08-03 DIAGNOSIS — D509 Iron deficiency anemia, unspecified: Secondary | ICD-10-CM | POA: Diagnosis not present

## 2018-08-03 DIAGNOSIS — D631 Anemia in chronic kidney disease: Secondary | ICD-10-CM | POA: Diagnosis not present

## 2018-08-03 DIAGNOSIS — N186 End stage renal disease: Secondary | ICD-10-CM | POA: Diagnosis not present

## 2018-08-04 DIAGNOSIS — N2581 Secondary hyperparathyroidism of renal origin: Secondary | ICD-10-CM | POA: Diagnosis not present

## 2018-08-04 DIAGNOSIS — N186 End stage renal disease: Secondary | ICD-10-CM | POA: Diagnosis not present

## 2018-08-04 DIAGNOSIS — D509 Iron deficiency anemia, unspecified: Secondary | ICD-10-CM | POA: Diagnosis not present

## 2018-08-04 DIAGNOSIS — D631 Anemia in chronic kidney disease: Secondary | ICD-10-CM | POA: Diagnosis not present

## 2018-08-05 DIAGNOSIS — N2581 Secondary hyperparathyroidism of renal origin: Secondary | ICD-10-CM | POA: Diagnosis not present

## 2018-08-05 DIAGNOSIS — Z114 Encounter for screening for human immunodeficiency virus [HIV]: Secondary | ICD-10-CM | POA: Diagnosis not present

## 2018-08-05 DIAGNOSIS — D509 Iron deficiency anemia, unspecified: Secondary | ICD-10-CM | POA: Diagnosis not present

## 2018-08-05 DIAGNOSIS — N186 End stage renal disease: Secondary | ICD-10-CM | POA: Diagnosis not present

## 2018-08-05 DIAGNOSIS — E119 Type 2 diabetes mellitus without complications: Secondary | ICD-10-CM | POA: Diagnosis not present

## 2018-08-05 DIAGNOSIS — D631 Anemia in chronic kidney disease: Secondary | ICD-10-CM | POA: Diagnosis not present

## 2018-08-05 DIAGNOSIS — Z1159 Encounter for screening for other viral diseases: Secondary | ICD-10-CM | POA: Diagnosis not present

## 2018-08-06 DIAGNOSIS — N186 End stage renal disease: Secondary | ICD-10-CM | POA: Diagnosis not present

## 2018-08-07 DIAGNOSIS — N186 End stage renal disease: Secondary | ICD-10-CM | POA: Diagnosis not present

## 2018-08-08 DIAGNOSIS — N186 End stage renal disease: Secondary | ICD-10-CM | POA: Diagnosis not present

## 2018-08-09 DIAGNOSIS — N186 End stage renal disease: Secondary | ICD-10-CM | POA: Diagnosis not present

## 2018-08-10 DIAGNOSIS — N186 End stage renal disease: Secondary | ICD-10-CM | POA: Diagnosis not present

## 2018-08-11 DIAGNOSIS — N186 End stage renal disease: Secondary | ICD-10-CM | POA: Diagnosis not present

## 2018-08-12 DIAGNOSIS — N186 End stage renal disease: Secondary | ICD-10-CM | POA: Diagnosis not present

## 2018-08-13 DIAGNOSIS — N186 End stage renal disease: Secondary | ICD-10-CM | POA: Diagnosis not present

## 2018-08-14 DIAGNOSIS — N186 End stage renal disease: Secondary | ICD-10-CM | POA: Diagnosis not present

## 2018-08-15 DIAGNOSIS — N186 End stage renal disease: Secondary | ICD-10-CM | POA: Diagnosis not present

## 2018-08-16 DIAGNOSIS — N186 End stage renal disease: Secondary | ICD-10-CM | POA: Diagnosis not present

## 2018-08-17 DIAGNOSIS — N186 End stage renal disease: Secondary | ICD-10-CM | POA: Diagnosis not present

## 2018-08-18 DIAGNOSIS — N186 End stage renal disease: Secondary | ICD-10-CM | POA: Diagnosis not present

## 2018-08-19 DIAGNOSIS — N186 End stage renal disease: Secondary | ICD-10-CM | POA: Diagnosis not present

## 2018-08-20 DIAGNOSIS — N186 End stage renal disease: Secondary | ICD-10-CM | POA: Diagnosis not present

## 2018-08-21 DIAGNOSIS — N186 End stage renal disease: Secondary | ICD-10-CM | POA: Diagnosis not present

## 2018-08-22 DIAGNOSIS — N186 End stage renal disease: Secondary | ICD-10-CM | POA: Diagnosis not present

## 2018-08-23 DIAGNOSIS — N186 End stage renal disease: Secondary | ICD-10-CM | POA: Diagnosis not present

## 2018-08-24 DIAGNOSIS — Z992 Dependence on renal dialysis: Secondary | ICD-10-CM | POA: Diagnosis not present

## 2018-08-24 DIAGNOSIS — N186 End stage renal disease: Secondary | ICD-10-CM | POA: Diagnosis not present

## 2018-08-25 DIAGNOSIS — N186 End stage renal disease: Secondary | ICD-10-CM | POA: Diagnosis not present

## 2018-08-25 DIAGNOSIS — N2581 Secondary hyperparathyroidism of renal origin: Secondary | ICD-10-CM | POA: Diagnosis not present

## 2018-08-26 DIAGNOSIS — N186 End stage renal disease: Secondary | ICD-10-CM | POA: Diagnosis not present

## 2018-08-26 DIAGNOSIS — N2581 Secondary hyperparathyroidism of renal origin: Secondary | ICD-10-CM | POA: Diagnosis not present

## 2018-08-27 DIAGNOSIS — N186 End stage renal disease: Secondary | ICD-10-CM | POA: Diagnosis not present

## 2018-08-27 DIAGNOSIS — N2581 Secondary hyperparathyroidism of renal origin: Secondary | ICD-10-CM | POA: Diagnosis not present

## 2018-08-28 DIAGNOSIS — N2581 Secondary hyperparathyroidism of renal origin: Secondary | ICD-10-CM | POA: Diagnosis not present

## 2018-08-28 DIAGNOSIS — N186 End stage renal disease: Secondary | ICD-10-CM | POA: Diagnosis not present

## 2018-08-29 DIAGNOSIS — N186 End stage renal disease: Secondary | ICD-10-CM | POA: Diagnosis not present

## 2018-08-29 DIAGNOSIS — N2581 Secondary hyperparathyroidism of renal origin: Secondary | ICD-10-CM | POA: Diagnosis not present

## 2018-08-30 DIAGNOSIS — N2581 Secondary hyperparathyroidism of renal origin: Secondary | ICD-10-CM | POA: Diagnosis not present

## 2018-08-30 DIAGNOSIS — N186 End stage renal disease: Secondary | ICD-10-CM | POA: Diagnosis not present

## 2018-08-31 DIAGNOSIS — N2581 Secondary hyperparathyroidism of renal origin: Secondary | ICD-10-CM | POA: Diagnosis not present

## 2018-08-31 DIAGNOSIS — N186 End stage renal disease: Secondary | ICD-10-CM | POA: Diagnosis not present

## 2018-09-01 DIAGNOSIS — N186 End stage renal disease: Secondary | ICD-10-CM | POA: Diagnosis not present

## 2018-09-01 DIAGNOSIS — N2581 Secondary hyperparathyroidism of renal origin: Secondary | ICD-10-CM | POA: Diagnosis not present

## 2018-09-02 DIAGNOSIS — N2581 Secondary hyperparathyroidism of renal origin: Secondary | ICD-10-CM | POA: Diagnosis not present

## 2018-09-02 DIAGNOSIS — N186 End stage renal disease: Secondary | ICD-10-CM | POA: Diagnosis not present

## 2018-09-03 DIAGNOSIS — N186 End stage renal disease: Secondary | ICD-10-CM | POA: Diagnosis not present

## 2018-09-03 DIAGNOSIS — N2581 Secondary hyperparathyroidism of renal origin: Secondary | ICD-10-CM | POA: Diagnosis not present

## 2018-09-04 DIAGNOSIS — D509 Iron deficiency anemia, unspecified: Secondary | ICD-10-CM | POA: Diagnosis not present

## 2018-09-04 DIAGNOSIS — N186 End stage renal disease: Secondary | ICD-10-CM | POA: Diagnosis not present

## 2018-09-04 DIAGNOSIS — N2581 Secondary hyperparathyroidism of renal origin: Secondary | ICD-10-CM | POA: Diagnosis not present

## 2018-09-04 DIAGNOSIS — D631 Anemia in chronic kidney disease: Secondary | ICD-10-CM | POA: Diagnosis not present

## 2018-09-05 DIAGNOSIS — N186 End stage renal disease: Secondary | ICD-10-CM | POA: Diagnosis not present

## 2018-09-05 DIAGNOSIS — N2581 Secondary hyperparathyroidism of renal origin: Secondary | ICD-10-CM | POA: Diagnosis not present

## 2018-09-05 DIAGNOSIS — D509 Iron deficiency anemia, unspecified: Secondary | ICD-10-CM | POA: Diagnosis not present

## 2018-09-05 DIAGNOSIS — D631 Anemia in chronic kidney disease: Secondary | ICD-10-CM | POA: Diagnosis not present

## 2018-09-06 DIAGNOSIS — D631 Anemia in chronic kidney disease: Secondary | ICD-10-CM | POA: Diagnosis not present

## 2018-09-06 DIAGNOSIS — N186 End stage renal disease: Secondary | ICD-10-CM | POA: Diagnosis not present

## 2018-09-06 DIAGNOSIS — D509 Iron deficiency anemia, unspecified: Secondary | ICD-10-CM | POA: Diagnosis not present

## 2018-09-06 DIAGNOSIS — N2581 Secondary hyperparathyroidism of renal origin: Secondary | ICD-10-CM | POA: Diagnosis not present

## 2018-09-07 DIAGNOSIS — D509 Iron deficiency anemia, unspecified: Secondary | ICD-10-CM | POA: Diagnosis not present

## 2018-09-07 DIAGNOSIS — N186 End stage renal disease: Secondary | ICD-10-CM | POA: Diagnosis not present

## 2018-09-07 DIAGNOSIS — D631 Anemia in chronic kidney disease: Secondary | ICD-10-CM | POA: Diagnosis not present

## 2018-09-07 DIAGNOSIS — N2581 Secondary hyperparathyroidism of renal origin: Secondary | ICD-10-CM | POA: Diagnosis not present

## 2018-09-08 DIAGNOSIS — D509 Iron deficiency anemia, unspecified: Secondary | ICD-10-CM | POA: Diagnosis not present

## 2018-09-08 DIAGNOSIS — D631 Anemia in chronic kidney disease: Secondary | ICD-10-CM | POA: Diagnosis not present

## 2018-09-08 DIAGNOSIS — N186 End stage renal disease: Secondary | ICD-10-CM | POA: Diagnosis not present

## 2018-09-08 DIAGNOSIS — N2581 Secondary hyperparathyroidism of renal origin: Secondary | ICD-10-CM | POA: Diagnosis not present

## 2018-09-09 DIAGNOSIS — N186 End stage renal disease: Secondary | ICD-10-CM | POA: Diagnosis not present

## 2018-09-09 DIAGNOSIS — N2581 Secondary hyperparathyroidism of renal origin: Secondary | ICD-10-CM | POA: Diagnosis not present

## 2018-09-09 DIAGNOSIS — D631 Anemia in chronic kidney disease: Secondary | ICD-10-CM | POA: Diagnosis not present

## 2018-09-09 DIAGNOSIS — D509 Iron deficiency anemia, unspecified: Secondary | ICD-10-CM | POA: Diagnosis not present

## 2018-09-10 DIAGNOSIS — N186 End stage renal disease: Secondary | ICD-10-CM | POA: Diagnosis not present

## 2018-09-10 DIAGNOSIS — D509 Iron deficiency anemia, unspecified: Secondary | ICD-10-CM | POA: Diagnosis not present

## 2018-09-10 DIAGNOSIS — D631 Anemia in chronic kidney disease: Secondary | ICD-10-CM | POA: Diagnosis not present

## 2018-09-10 DIAGNOSIS — N2581 Secondary hyperparathyroidism of renal origin: Secondary | ICD-10-CM | POA: Diagnosis not present

## 2018-09-11 ENCOUNTER — Other Ambulatory Visit: Payer: Self-pay | Admitting: Internal Medicine

## 2018-09-11 DIAGNOSIS — N186 End stage renal disease: Secondary | ICD-10-CM | POA: Diagnosis not present

## 2018-09-11 DIAGNOSIS — N2581 Secondary hyperparathyroidism of renal origin: Secondary | ICD-10-CM | POA: Diagnosis not present

## 2018-09-11 DIAGNOSIS — D631 Anemia in chronic kidney disease: Secondary | ICD-10-CM | POA: Diagnosis not present

## 2018-09-11 DIAGNOSIS — D509 Iron deficiency anemia, unspecified: Secondary | ICD-10-CM | POA: Diagnosis not present

## 2018-09-12 DIAGNOSIS — D509 Iron deficiency anemia, unspecified: Secondary | ICD-10-CM | POA: Diagnosis not present

## 2018-09-12 DIAGNOSIS — N2581 Secondary hyperparathyroidism of renal origin: Secondary | ICD-10-CM | POA: Diagnosis not present

## 2018-09-12 DIAGNOSIS — N186 End stage renal disease: Secondary | ICD-10-CM | POA: Diagnosis not present

## 2018-09-12 DIAGNOSIS — D631 Anemia in chronic kidney disease: Secondary | ICD-10-CM | POA: Diagnosis not present

## 2018-09-13 DIAGNOSIS — N2581 Secondary hyperparathyroidism of renal origin: Secondary | ICD-10-CM | POA: Diagnosis not present

## 2018-09-13 DIAGNOSIS — D509 Iron deficiency anemia, unspecified: Secondary | ICD-10-CM | POA: Diagnosis not present

## 2018-09-13 DIAGNOSIS — D631 Anemia in chronic kidney disease: Secondary | ICD-10-CM | POA: Diagnosis not present

## 2018-09-13 DIAGNOSIS — N186 End stage renal disease: Secondary | ICD-10-CM | POA: Diagnosis not present

## 2018-09-14 DIAGNOSIS — N2581 Secondary hyperparathyroidism of renal origin: Secondary | ICD-10-CM | POA: Diagnosis not present

## 2018-09-14 DIAGNOSIS — N186 End stage renal disease: Secondary | ICD-10-CM | POA: Diagnosis not present

## 2018-09-15 DIAGNOSIS — N2581 Secondary hyperparathyroidism of renal origin: Secondary | ICD-10-CM | POA: Diagnosis not present

## 2018-09-15 DIAGNOSIS — N186 End stage renal disease: Secondary | ICD-10-CM | POA: Diagnosis not present

## 2018-09-16 DIAGNOSIS — N2581 Secondary hyperparathyroidism of renal origin: Secondary | ICD-10-CM | POA: Diagnosis not present

## 2018-09-16 DIAGNOSIS — N186 End stage renal disease: Secondary | ICD-10-CM | POA: Diagnosis not present

## 2018-09-17 DIAGNOSIS — N2581 Secondary hyperparathyroidism of renal origin: Secondary | ICD-10-CM | POA: Diagnosis not present

## 2018-09-17 DIAGNOSIS — N186 End stage renal disease: Secondary | ICD-10-CM | POA: Diagnosis not present

## 2018-09-18 DIAGNOSIS — N2581 Secondary hyperparathyroidism of renal origin: Secondary | ICD-10-CM | POA: Diagnosis not present

## 2018-09-18 DIAGNOSIS — N186 End stage renal disease: Secondary | ICD-10-CM | POA: Diagnosis not present

## 2018-09-19 DIAGNOSIS — N2581 Secondary hyperparathyroidism of renal origin: Secondary | ICD-10-CM | POA: Diagnosis not present

## 2018-09-19 DIAGNOSIS — N186 End stage renal disease: Secondary | ICD-10-CM | POA: Diagnosis not present

## 2018-09-20 DIAGNOSIS — N186 End stage renal disease: Secondary | ICD-10-CM | POA: Diagnosis not present

## 2018-09-20 DIAGNOSIS — N2581 Secondary hyperparathyroidism of renal origin: Secondary | ICD-10-CM | POA: Diagnosis not present

## 2018-09-21 DIAGNOSIS — N2581 Secondary hyperparathyroidism of renal origin: Secondary | ICD-10-CM | POA: Diagnosis not present

## 2018-09-21 DIAGNOSIS — N186 End stage renal disease: Secondary | ICD-10-CM | POA: Diagnosis not present

## 2018-09-22 DIAGNOSIS — N186 End stage renal disease: Secondary | ICD-10-CM | POA: Diagnosis not present

## 2018-09-22 DIAGNOSIS — N2581 Secondary hyperparathyroidism of renal origin: Secondary | ICD-10-CM | POA: Diagnosis not present

## 2018-09-23 DIAGNOSIS — N2581 Secondary hyperparathyroidism of renal origin: Secondary | ICD-10-CM | POA: Diagnosis not present

## 2018-09-23 DIAGNOSIS — N186 End stage renal disease: Secondary | ICD-10-CM | POA: Diagnosis not present

## 2018-09-24 DIAGNOSIS — N186 End stage renal disease: Secondary | ICD-10-CM | POA: Diagnosis not present

## 2018-09-24 DIAGNOSIS — N2581 Secondary hyperparathyroidism of renal origin: Secondary | ICD-10-CM | POA: Diagnosis not present

## 2018-09-24 DIAGNOSIS — Z992 Dependence on renal dialysis: Secondary | ICD-10-CM | POA: Diagnosis not present

## 2018-09-25 DIAGNOSIS — D631 Anemia in chronic kidney disease: Secondary | ICD-10-CM | POA: Diagnosis not present

## 2018-09-25 DIAGNOSIS — D509 Iron deficiency anemia, unspecified: Secondary | ICD-10-CM | POA: Diagnosis not present

## 2018-09-25 DIAGNOSIS — N2581 Secondary hyperparathyroidism of renal origin: Secondary | ICD-10-CM | POA: Diagnosis not present

## 2018-09-25 DIAGNOSIS — N186 End stage renal disease: Secondary | ICD-10-CM | POA: Diagnosis not present

## 2018-09-26 DIAGNOSIS — D509 Iron deficiency anemia, unspecified: Secondary | ICD-10-CM | POA: Diagnosis not present

## 2018-09-26 DIAGNOSIS — D631 Anemia in chronic kidney disease: Secondary | ICD-10-CM | POA: Diagnosis not present

## 2018-09-26 DIAGNOSIS — N2581 Secondary hyperparathyroidism of renal origin: Secondary | ICD-10-CM | POA: Diagnosis not present

## 2018-09-26 DIAGNOSIS — N186 End stage renal disease: Secondary | ICD-10-CM | POA: Diagnosis not present

## 2018-09-27 DIAGNOSIS — D509 Iron deficiency anemia, unspecified: Secondary | ICD-10-CM | POA: Diagnosis not present

## 2018-09-27 DIAGNOSIS — N186 End stage renal disease: Secondary | ICD-10-CM | POA: Diagnosis not present

## 2018-09-27 DIAGNOSIS — N2581 Secondary hyperparathyroidism of renal origin: Secondary | ICD-10-CM | POA: Diagnosis not present

## 2018-09-27 DIAGNOSIS — D631 Anemia in chronic kidney disease: Secondary | ICD-10-CM | POA: Diagnosis not present

## 2018-09-28 DIAGNOSIS — N2581 Secondary hyperparathyroidism of renal origin: Secondary | ICD-10-CM | POA: Diagnosis not present

## 2018-09-28 DIAGNOSIS — D509 Iron deficiency anemia, unspecified: Secondary | ICD-10-CM | POA: Diagnosis not present

## 2018-09-28 DIAGNOSIS — N186 End stage renal disease: Secondary | ICD-10-CM | POA: Diagnosis not present

## 2018-09-28 DIAGNOSIS — D631 Anemia in chronic kidney disease: Secondary | ICD-10-CM | POA: Diagnosis not present

## 2018-09-29 DIAGNOSIS — D509 Iron deficiency anemia, unspecified: Secondary | ICD-10-CM | POA: Diagnosis not present

## 2018-09-29 DIAGNOSIS — N2581 Secondary hyperparathyroidism of renal origin: Secondary | ICD-10-CM | POA: Diagnosis not present

## 2018-09-29 DIAGNOSIS — N186 End stage renal disease: Secondary | ICD-10-CM | POA: Diagnosis not present

## 2018-09-29 DIAGNOSIS — D631 Anemia in chronic kidney disease: Secondary | ICD-10-CM | POA: Diagnosis not present

## 2018-09-30 DIAGNOSIS — D631 Anemia in chronic kidney disease: Secondary | ICD-10-CM | POA: Diagnosis not present

## 2018-09-30 DIAGNOSIS — N2581 Secondary hyperparathyroidism of renal origin: Secondary | ICD-10-CM | POA: Diagnosis not present

## 2018-09-30 DIAGNOSIS — D509 Iron deficiency anemia, unspecified: Secondary | ICD-10-CM | POA: Diagnosis not present

## 2018-09-30 DIAGNOSIS — E119 Type 2 diabetes mellitus without complications: Secondary | ICD-10-CM | POA: Diagnosis not present

## 2018-09-30 DIAGNOSIS — N186 End stage renal disease: Secondary | ICD-10-CM | POA: Diagnosis not present

## 2018-09-30 DIAGNOSIS — Z4932 Encounter for adequacy testing for peritoneal dialysis: Secondary | ICD-10-CM | POA: Diagnosis not present

## 2018-10-01 DIAGNOSIS — D509 Iron deficiency anemia, unspecified: Secondary | ICD-10-CM | POA: Diagnosis not present

## 2018-10-01 DIAGNOSIS — D631 Anemia in chronic kidney disease: Secondary | ICD-10-CM | POA: Diagnosis not present

## 2018-10-01 DIAGNOSIS — N186 End stage renal disease: Secondary | ICD-10-CM | POA: Diagnosis not present

## 2018-10-01 DIAGNOSIS — N2581 Secondary hyperparathyroidism of renal origin: Secondary | ICD-10-CM | POA: Diagnosis not present

## 2018-10-02 DIAGNOSIS — N186 End stage renal disease: Secondary | ICD-10-CM | POA: Diagnosis not present

## 2018-10-02 DIAGNOSIS — D509 Iron deficiency anemia, unspecified: Secondary | ICD-10-CM | POA: Diagnosis not present

## 2018-10-02 DIAGNOSIS — D631 Anemia in chronic kidney disease: Secondary | ICD-10-CM | POA: Diagnosis not present

## 2018-10-02 DIAGNOSIS — N2581 Secondary hyperparathyroidism of renal origin: Secondary | ICD-10-CM | POA: Diagnosis not present

## 2018-10-03 DIAGNOSIS — N186 End stage renal disease: Secondary | ICD-10-CM | POA: Diagnosis not present

## 2018-10-03 DIAGNOSIS — D631 Anemia in chronic kidney disease: Secondary | ICD-10-CM | POA: Diagnosis not present

## 2018-10-03 DIAGNOSIS — D509 Iron deficiency anemia, unspecified: Secondary | ICD-10-CM | POA: Diagnosis not present

## 2018-10-03 DIAGNOSIS — N2581 Secondary hyperparathyroidism of renal origin: Secondary | ICD-10-CM | POA: Diagnosis not present

## 2018-10-04 DIAGNOSIS — N2581 Secondary hyperparathyroidism of renal origin: Secondary | ICD-10-CM | POA: Diagnosis not present

## 2018-10-04 DIAGNOSIS — D509 Iron deficiency anemia, unspecified: Secondary | ICD-10-CM | POA: Diagnosis not present

## 2018-10-04 DIAGNOSIS — D631 Anemia in chronic kidney disease: Secondary | ICD-10-CM | POA: Diagnosis not present

## 2018-10-04 DIAGNOSIS — N186 End stage renal disease: Secondary | ICD-10-CM | POA: Diagnosis not present

## 2018-10-05 DIAGNOSIS — N186 End stage renal disease: Secondary | ICD-10-CM | POA: Diagnosis not present

## 2018-10-05 DIAGNOSIS — N2581 Secondary hyperparathyroidism of renal origin: Secondary | ICD-10-CM | POA: Diagnosis not present

## 2018-10-06 DIAGNOSIS — N186 End stage renal disease: Secondary | ICD-10-CM | POA: Diagnosis not present

## 2018-10-06 DIAGNOSIS — N2581 Secondary hyperparathyroidism of renal origin: Secondary | ICD-10-CM | POA: Diagnosis not present

## 2018-10-07 DIAGNOSIS — N186 End stage renal disease: Secondary | ICD-10-CM | POA: Diagnosis not present

## 2018-10-07 DIAGNOSIS — N2581 Secondary hyperparathyroidism of renal origin: Secondary | ICD-10-CM | POA: Diagnosis not present

## 2018-10-08 DIAGNOSIS — N186 End stage renal disease: Secondary | ICD-10-CM | POA: Diagnosis not present

## 2018-10-08 DIAGNOSIS — N2581 Secondary hyperparathyroidism of renal origin: Secondary | ICD-10-CM | POA: Diagnosis not present

## 2018-10-09 DIAGNOSIS — N2581 Secondary hyperparathyroidism of renal origin: Secondary | ICD-10-CM | POA: Diagnosis not present

## 2018-10-09 DIAGNOSIS — N186 End stage renal disease: Secondary | ICD-10-CM | POA: Diagnosis not present

## 2018-10-10 DIAGNOSIS — N2581 Secondary hyperparathyroidism of renal origin: Secondary | ICD-10-CM | POA: Diagnosis not present

## 2018-10-10 DIAGNOSIS — N186 End stage renal disease: Secondary | ICD-10-CM | POA: Diagnosis not present

## 2018-10-11 DIAGNOSIS — N2581 Secondary hyperparathyroidism of renal origin: Secondary | ICD-10-CM | POA: Diagnosis not present

## 2018-10-11 DIAGNOSIS — N186 End stage renal disease: Secondary | ICD-10-CM | POA: Diagnosis not present

## 2018-10-12 DIAGNOSIS — N186 End stage renal disease: Secondary | ICD-10-CM | POA: Diagnosis not present

## 2018-10-12 DIAGNOSIS — N2581 Secondary hyperparathyroidism of renal origin: Secondary | ICD-10-CM | POA: Diagnosis not present

## 2018-10-13 DIAGNOSIS — N2581 Secondary hyperparathyroidism of renal origin: Secondary | ICD-10-CM | POA: Diagnosis not present

## 2018-10-13 DIAGNOSIS — N186 End stage renal disease: Secondary | ICD-10-CM | POA: Diagnosis not present

## 2018-10-14 DIAGNOSIS — N186 End stage renal disease: Secondary | ICD-10-CM | POA: Diagnosis not present

## 2018-10-14 DIAGNOSIS — N2581 Secondary hyperparathyroidism of renal origin: Secondary | ICD-10-CM | POA: Diagnosis not present

## 2018-10-15 DIAGNOSIS — N186 End stage renal disease: Secondary | ICD-10-CM | POA: Diagnosis not present

## 2018-10-16 DIAGNOSIS — N186 End stage renal disease: Secondary | ICD-10-CM | POA: Diagnosis not present

## 2018-10-17 DIAGNOSIS — N186 End stage renal disease: Secondary | ICD-10-CM | POA: Diagnosis not present

## 2018-10-18 DIAGNOSIS — N186 End stage renal disease: Secondary | ICD-10-CM | POA: Diagnosis not present

## 2018-10-19 DIAGNOSIS — N186 End stage renal disease: Secondary | ICD-10-CM | POA: Diagnosis not present

## 2018-10-20 DIAGNOSIS — N186 End stage renal disease: Secondary | ICD-10-CM | POA: Diagnosis not present

## 2018-10-21 DIAGNOSIS — N186 End stage renal disease: Secondary | ICD-10-CM | POA: Diagnosis not present

## 2018-10-22 DIAGNOSIS — N186 End stage renal disease: Secondary | ICD-10-CM | POA: Diagnosis not present

## 2018-10-23 DIAGNOSIS — N186 End stage renal disease: Secondary | ICD-10-CM | POA: Diagnosis not present

## 2018-10-24 DIAGNOSIS — Z992 Dependence on renal dialysis: Secondary | ICD-10-CM | POA: Diagnosis not present

## 2018-10-24 DIAGNOSIS — N186 End stage renal disease: Secondary | ICD-10-CM | POA: Diagnosis not present

## 2018-10-25 DIAGNOSIS — N186 End stage renal disease: Secondary | ICD-10-CM | POA: Diagnosis not present

## 2018-10-25 DIAGNOSIS — N2581 Secondary hyperparathyroidism of renal origin: Secondary | ICD-10-CM | POA: Diagnosis not present

## 2018-10-25 DIAGNOSIS — D509 Iron deficiency anemia, unspecified: Secondary | ICD-10-CM | POA: Diagnosis not present

## 2018-10-26 DIAGNOSIS — N186 End stage renal disease: Secondary | ICD-10-CM | POA: Diagnosis not present

## 2018-10-26 DIAGNOSIS — N2581 Secondary hyperparathyroidism of renal origin: Secondary | ICD-10-CM | POA: Diagnosis not present

## 2018-10-26 DIAGNOSIS — D509 Iron deficiency anemia, unspecified: Secondary | ICD-10-CM | POA: Diagnosis not present

## 2018-10-27 DIAGNOSIS — N186 End stage renal disease: Secondary | ICD-10-CM | POA: Diagnosis not present

## 2018-10-27 DIAGNOSIS — N2581 Secondary hyperparathyroidism of renal origin: Secondary | ICD-10-CM | POA: Diagnosis not present

## 2018-10-27 DIAGNOSIS — D509 Iron deficiency anemia, unspecified: Secondary | ICD-10-CM | POA: Diagnosis not present

## 2018-10-28 DIAGNOSIS — N186 End stage renal disease: Secondary | ICD-10-CM | POA: Diagnosis not present

## 2018-10-28 DIAGNOSIS — N2581 Secondary hyperparathyroidism of renal origin: Secondary | ICD-10-CM | POA: Diagnosis not present

## 2018-10-28 DIAGNOSIS — D509 Iron deficiency anemia, unspecified: Secondary | ICD-10-CM | POA: Diagnosis not present

## 2018-10-29 DIAGNOSIS — N2581 Secondary hyperparathyroidism of renal origin: Secondary | ICD-10-CM | POA: Diagnosis not present

## 2018-10-29 DIAGNOSIS — D509 Iron deficiency anemia, unspecified: Secondary | ICD-10-CM | POA: Diagnosis not present

## 2018-10-29 DIAGNOSIS — N186 End stage renal disease: Secondary | ICD-10-CM | POA: Diagnosis not present

## 2018-10-30 DIAGNOSIS — N186 End stage renal disease: Secondary | ICD-10-CM | POA: Diagnosis not present

## 2018-10-30 DIAGNOSIS — E119 Type 2 diabetes mellitus without complications: Secondary | ICD-10-CM | POA: Diagnosis not present

## 2018-10-30 DIAGNOSIS — D509 Iron deficiency anemia, unspecified: Secondary | ICD-10-CM | POA: Diagnosis not present

## 2018-10-30 DIAGNOSIS — N2581 Secondary hyperparathyroidism of renal origin: Secondary | ICD-10-CM | POA: Diagnosis not present

## 2018-10-31 DIAGNOSIS — D509 Iron deficiency anemia, unspecified: Secondary | ICD-10-CM | POA: Diagnosis not present

## 2018-10-31 DIAGNOSIS — N186 End stage renal disease: Secondary | ICD-10-CM | POA: Diagnosis not present

## 2018-10-31 DIAGNOSIS — N2581 Secondary hyperparathyroidism of renal origin: Secondary | ICD-10-CM | POA: Diagnosis not present

## 2018-11-01 DIAGNOSIS — N186 End stage renal disease: Secondary | ICD-10-CM | POA: Diagnosis not present

## 2018-11-01 DIAGNOSIS — D509 Iron deficiency anemia, unspecified: Secondary | ICD-10-CM | POA: Diagnosis not present

## 2018-11-01 DIAGNOSIS — N2581 Secondary hyperparathyroidism of renal origin: Secondary | ICD-10-CM | POA: Diagnosis not present

## 2018-11-02 DIAGNOSIS — N2581 Secondary hyperparathyroidism of renal origin: Secondary | ICD-10-CM | POA: Diagnosis not present

## 2018-11-02 DIAGNOSIS — N186 End stage renal disease: Secondary | ICD-10-CM | POA: Diagnosis not present

## 2018-11-02 DIAGNOSIS — D509 Iron deficiency anemia, unspecified: Secondary | ICD-10-CM | POA: Diagnosis not present

## 2018-11-03 DIAGNOSIS — N2581 Secondary hyperparathyroidism of renal origin: Secondary | ICD-10-CM | POA: Diagnosis not present

## 2018-11-03 DIAGNOSIS — D509 Iron deficiency anemia, unspecified: Secondary | ICD-10-CM | POA: Diagnosis not present

## 2018-11-03 DIAGNOSIS — N186 End stage renal disease: Secondary | ICD-10-CM | POA: Diagnosis not present

## 2018-11-04 DIAGNOSIS — N186 End stage renal disease: Secondary | ICD-10-CM | POA: Diagnosis not present

## 2018-11-05 DIAGNOSIS — N186 End stage renal disease: Secondary | ICD-10-CM | POA: Diagnosis not present

## 2018-11-06 DIAGNOSIS — N186 End stage renal disease: Secondary | ICD-10-CM | POA: Diagnosis not present

## 2018-11-07 DIAGNOSIS — N186 End stage renal disease: Secondary | ICD-10-CM | POA: Diagnosis not present

## 2018-11-08 DIAGNOSIS — N186 End stage renal disease: Secondary | ICD-10-CM | POA: Diagnosis not present

## 2018-11-09 DIAGNOSIS — N186 End stage renal disease: Secondary | ICD-10-CM | POA: Diagnosis not present

## 2018-11-10 DIAGNOSIS — N186 End stage renal disease: Secondary | ICD-10-CM | POA: Diagnosis not present

## 2018-11-11 DIAGNOSIS — N186 End stage renal disease: Secondary | ICD-10-CM | POA: Diagnosis not present

## 2018-11-12 DIAGNOSIS — N186 End stage renal disease: Secondary | ICD-10-CM | POA: Diagnosis not present

## 2018-11-13 DIAGNOSIS — N186 End stage renal disease: Secondary | ICD-10-CM | POA: Diagnosis not present

## 2018-11-14 DIAGNOSIS — N186 End stage renal disease: Secondary | ICD-10-CM | POA: Diagnosis not present

## 2018-11-15 DIAGNOSIS — N186 End stage renal disease: Secondary | ICD-10-CM | POA: Diagnosis not present

## 2018-11-16 DIAGNOSIS — N186 End stage renal disease: Secondary | ICD-10-CM | POA: Diagnosis not present

## 2018-11-17 DIAGNOSIS — N186 End stage renal disease: Secondary | ICD-10-CM | POA: Diagnosis not present

## 2018-11-18 DIAGNOSIS — N186 End stage renal disease: Secondary | ICD-10-CM | POA: Diagnosis not present

## 2018-11-19 DIAGNOSIS — N186 End stage renal disease: Secondary | ICD-10-CM | POA: Diagnosis not present

## 2018-11-20 DIAGNOSIS — N186 End stage renal disease: Secondary | ICD-10-CM | POA: Diagnosis not present

## 2018-11-21 DIAGNOSIS — N186 End stage renal disease: Secondary | ICD-10-CM | POA: Diagnosis not present

## 2018-11-22 DIAGNOSIS — N186 End stage renal disease: Secondary | ICD-10-CM | POA: Diagnosis not present

## 2018-11-23 DIAGNOSIS — N186 End stage renal disease: Secondary | ICD-10-CM | POA: Diagnosis not present

## 2018-11-24 DIAGNOSIS — N186 End stage renal disease: Secondary | ICD-10-CM | POA: Diagnosis not present

## 2018-11-24 DIAGNOSIS — Z992 Dependence on renal dialysis: Secondary | ICD-10-CM | POA: Diagnosis not present

## 2018-11-25 DIAGNOSIS — D509 Iron deficiency anemia, unspecified: Secondary | ICD-10-CM | POA: Diagnosis not present

## 2018-11-25 DIAGNOSIS — D631 Anemia in chronic kidney disease: Secondary | ICD-10-CM | POA: Diagnosis not present

## 2018-11-25 DIAGNOSIS — N2581 Secondary hyperparathyroidism of renal origin: Secondary | ICD-10-CM | POA: Diagnosis not present

## 2018-11-25 DIAGNOSIS — N186 End stage renal disease: Secondary | ICD-10-CM | POA: Diagnosis not present

## 2018-11-26 DIAGNOSIS — N2581 Secondary hyperparathyroidism of renal origin: Secondary | ICD-10-CM | POA: Diagnosis not present

## 2018-11-26 DIAGNOSIS — D631 Anemia in chronic kidney disease: Secondary | ICD-10-CM | POA: Diagnosis not present

## 2018-11-26 DIAGNOSIS — D509 Iron deficiency anemia, unspecified: Secondary | ICD-10-CM | POA: Diagnosis not present

## 2018-11-26 DIAGNOSIS — N186 End stage renal disease: Secondary | ICD-10-CM | POA: Diagnosis not present

## 2018-11-27 DIAGNOSIS — N186 End stage renal disease: Secondary | ICD-10-CM | POA: Diagnosis not present

## 2018-11-27 DIAGNOSIS — D509 Iron deficiency anemia, unspecified: Secondary | ICD-10-CM | POA: Diagnosis not present

## 2018-11-27 DIAGNOSIS — D631 Anemia in chronic kidney disease: Secondary | ICD-10-CM | POA: Diagnosis not present

## 2018-11-27 DIAGNOSIS — N2581 Secondary hyperparathyroidism of renal origin: Secondary | ICD-10-CM | POA: Diagnosis not present

## 2018-11-28 DIAGNOSIS — D631 Anemia in chronic kidney disease: Secondary | ICD-10-CM | POA: Diagnosis not present

## 2018-11-28 DIAGNOSIS — N2581 Secondary hyperparathyroidism of renal origin: Secondary | ICD-10-CM | POA: Diagnosis not present

## 2018-11-28 DIAGNOSIS — D509 Iron deficiency anemia, unspecified: Secondary | ICD-10-CM | POA: Diagnosis not present

## 2018-11-28 DIAGNOSIS — N186 End stage renal disease: Secondary | ICD-10-CM | POA: Diagnosis not present

## 2018-11-29 DIAGNOSIS — N2581 Secondary hyperparathyroidism of renal origin: Secondary | ICD-10-CM | POA: Diagnosis not present

## 2018-11-29 DIAGNOSIS — E119 Type 2 diabetes mellitus without complications: Secondary | ICD-10-CM | POA: Diagnosis not present

## 2018-11-29 DIAGNOSIS — D509 Iron deficiency anemia, unspecified: Secondary | ICD-10-CM | POA: Diagnosis not present

## 2018-11-29 DIAGNOSIS — N186 End stage renal disease: Secondary | ICD-10-CM | POA: Diagnosis not present

## 2018-11-29 DIAGNOSIS — D631 Anemia in chronic kidney disease: Secondary | ICD-10-CM | POA: Diagnosis not present

## 2018-11-30 DIAGNOSIS — N2581 Secondary hyperparathyroidism of renal origin: Secondary | ICD-10-CM | POA: Diagnosis not present

## 2018-11-30 DIAGNOSIS — N186 End stage renal disease: Secondary | ICD-10-CM | POA: Diagnosis not present

## 2018-11-30 DIAGNOSIS — D509 Iron deficiency anemia, unspecified: Secondary | ICD-10-CM | POA: Diagnosis not present

## 2018-11-30 DIAGNOSIS — D631 Anemia in chronic kidney disease: Secondary | ICD-10-CM | POA: Diagnosis not present

## 2018-12-01 DIAGNOSIS — N186 End stage renal disease: Secondary | ICD-10-CM | POA: Diagnosis not present

## 2018-12-01 DIAGNOSIS — D509 Iron deficiency anemia, unspecified: Secondary | ICD-10-CM | POA: Diagnosis not present

## 2018-12-01 DIAGNOSIS — N2581 Secondary hyperparathyroidism of renal origin: Secondary | ICD-10-CM | POA: Diagnosis not present

## 2018-12-01 DIAGNOSIS — D631 Anemia in chronic kidney disease: Secondary | ICD-10-CM | POA: Diagnosis not present

## 2018-12-02 DIAGNOSIS — N2581 Secondary hyperparathyroidism of renal origin: Secondary | ICD-10-CM | POA: Diagnosis not present

## 2018-12-02 DIAGNOSIS — D631 Anemia in chronic kidney disease: Secondary | ICD-10-CM | POA: Diagnosis not present

## 2018-12-02 DIAGNOSIS — D509 Iron deficiency anemia, unspecified: Secondary | ICD-10-CM | POA: Diagnosis not present

## 2018-12-02 DIAGNOSIS — N186 End stage renal disease: Secondary | ICD-10-CM | POA: Diagnosis not present

## 2018-12-03 DIAGNOSIS — D509 Iron deficiency anemia, unspecified: Secondary | ICD-10-CM | POA: Diagnosis not present

## 2018-12-03 DIAGNOSIS — N2581 Secondary hyperparathyroidism of renal origin: Secondary | ICD-10-CM | POA: Diagnosis not present

## 2018-12-03 DIAGNOSIS — D631 Anemia in chronic kidney disease: Secondary | ICD-10-CM | POA: Diagnosis not present

## 2018-12-03 DIAGNOSIS — N186 End stage renal disease: Secondary | ICD-10-CM | POA: Diagnosis not present

## 2018-12-04 DIAGNOSIS — D509 Iron deficiency anemia, unspecified: Secondary | ICD-10-CM | POA: Diagnosis not present

## 2018-12-04 DIAGNOSIS — N186 End stage renal disease: Secondary | ICD-10-CM | POA: Diagnosis not present

## 2018-12-04 DIAGNOSIS — D631 Anemia in chronic kidney disease: Secondary | ICD-10-CM | POA: Diagnosis not present

## 2018-12-04 DIAGNOSIS — N2581 Secondary hyperparathyroidism of renal origin: Secondary | ICD-10-CM | POA: Diagnosis not present

## 2018-12-05 DIAGNOSIS — N186 End stage renal disease: Secondary | ICD-10-CM | POA: Diagnosis not present

## 2018-12-05 DIAGNOSIS — N2581 Secondary hyperparathyroidism of renal origin: Secondary | ICD-10-CM | POA: Diagnosis not present

## 2018-12-06 DIAGNOSIS — N2581 Secondary hyperparathyroidism of renal origin: Secondary | ICD-10-CM | POA: Diagnosis not present

## 2018-12-06 DIAGNOSIS — N186 End stage renal disease: Secondary | ICD-10-CM | POA: Diagnosis not present

## 2018-12-07 DIAGNOSIS — N186 End stage renal disease: Secondary | ICD-10-CM | POA: Diagnosis not present

## 2018-12-07 DIAGNOSIS — N2581 Secondary hyperparathyroidism of renal origin: Secondary | ICD-10-CM | POA: Diagnosis not present

## 2018-12-08 DIAGNOSIS — N2581 Secondary hyperparathyroidism of renal origin: Secondary | ICD-10-CM | POA: Diagnosis not present

## 2018-12-08 DIAGNOSIS — N186 End stage renal disease: Secondary | ICD-10-CM | POA: Diagnosis not present

## 2018-12-09 DIAGNOSIS — N2581 Secondary hyperparathyroidism of renal origin: Secondary | ICD-10-CM | POA: Diagnosis not present

## 2018-12-09 DIAGNOSIS — N186 End stage renal disease: Secondary | ICD-10-CM | POA: Diagnosis not present

## 2018-12-10 DIAGNOSIS — N2581 Secondary hyperparathyroidism of renal origin: Secondary | ICD-10-CM | POA: Diagnosis not present

## 2018-12-10 DIAGNOSIS — N186 End stage renal disease: Secondary | ICD-10-CM | POA: Diagnosis not present

## 2018-12-11 DIAGNOSIS — N2581 Secondary hyperparathyroidism of renal origin: Secondary | ICD-10-CM | POA: Diagnosis not present

## 2018-12-11 DIAGNOSIS — N186 End stage renal disease: Secondary | ICD-10-CM | POA: Diagnosis not present

## 2018-12-12 ENCOUNTER — Other Ambulatory Visit: Payer: Self-pay | Admitting: Internal Medicine

## 2018-12-12 DIAGNOSIS — N2581 Secondary hyperparathyroidism of renal origin: Secondary | ICD-10-CM | POA: Diagnosis not present

## 2018-12-12 DIAGNOSIS — N186 End stage renal disease: Secondary | ICD-10-CM | POA: Diagnosis not present

## 2018-12-13 DIAGNOSIS — N2581 Secondary hyperparathyroidism of renal origin: Secondary | ICD-10-CM | POA: Diagnosis not present

## 2018-12-13 DIAGNOSIS — N186 End stage renal disease: Secondary | ICD-10-CM | POA: Diagnosis not present

## 2018-12-14 DIAGNOSIS — N186 End stage renal disease: Secondary | ICD-10-CM | POA: Diagnosis not present

## 2018-12-14 DIAGNOSIS — N2581 Secondary hyperparathyroidism of renal origin: Secondary | ICD-10-CM | POA: Diagnosis not present

## 2018-12-15 DIAGNOSIS — N186 End stage renal disease: Secondary | ICD-10-CM | POA: Diagnosis not present

## 2018-12-16 DIAGNOSIS — N186 End stage renal disease: Secondary | ICD-10-CM | POA: Diagnosis not present

## 2018-12-17 DIAGNOSIS — N186 End stage renal disease: Secondary | ICD-10-CM | POA: Diagnosis not present

## 2018-12-18 DIAGNOSIS — N186 End stage renal disease: Secondary | ICD-10-CM | POA: Diagnosis not present

## 2018-12-19 DIAGNOSIS — N186 End stage renal disease: Secondary | ICD-10-CM | POA: Diagnosis not present

## 2018-12-20 DIAGNOSIS — N186 End stage renal disease: Secondary | ICD-10-CM | POA: Diagnosis not present

## 2018-12-21 DIAGNOSIS — N186 End stage renal disease: Secondary | ICD-10-CM | POA: Diagnosis not present

## 2018-12-22 DIAGNOSIS — N186 End stage renal disease: Secondary | ICD-10-CM | POA: Diagnosis not present

## 2018-12-23 DIAGNOSIS — N186 End stage renal disease: Secondary | ICD-10-CM | POA: Diagnosis not present

## 2018-12-24 DIAGNOSIS — N186 End stage renal disease: Secondary | ICD-10-CM | POA: Diagnosis not present

## 2018-12-24 DIAGNOSIS — Z992 Dependence on renal dialysis: Secondary | ICD-10-CM | POA: Diagnosis not present

## 2018-12-25 DIAGNOSIS — D509 Iron deficiency anemia, unspecified: Secondary | ICD-10-CM | POA: Diagnosis not present

## 2018-12-25 DIAGNOSIS — N186 End stage renal disease: Secondary | ICD-10-CM | POA: Diagnosis not present

## 2018-12-25 DIAGNOSIS — D631 Anemia in chronic kidney disease: Secondary | ICD-10-CM | POA: Diagnosis not present

## 2018-12-25 DIAGNOSIS — N2581 Secondary hyperparathyroidism of renal origin: Secondary | ICD-10-CM | POA: Diagnosis not present

## 2018-12-26 DIAGNOSIS — N2581 Secondary hyperparathyroidism of renal origin: Secondary | ICD-10-CM | POA: Diagnosis not present

## 2018-12-26 DIAGNOSIS — D509 Iron deficiency anemia, unspecified: Secondary | ICD-10-CM | POA: Diagnosis not present

## 2018-12-26 DIAGNOSIS — N186 End stage renal disease: Secondary | ICD-10-CM | POA: Diagnosis not present

## 2018-12-26 DIAGNOSIS — Z4932 Encounter for adequacy testing for peritoneal dialysis: Secondary | ICD-10-CM | POA: Diagnosis not present

## 2018-12-26 DIAGNOSIS — D631 Anemia in chronic kidney disease: Secondary | ICD-10-CM | POA: Diagnosis not present

## 2018-12-27 DIAGNOSIS — N2581 Secondary hyperparathyroidism of renal origin: Secondary | ICD-10-CM | POA: Diagnosis not present

## 2018-12-27 DIAGNOSIS — D509 Iron deficiency anemia, unspecified: Secondary | ICD-10-CM | POA: Diagnosis not present

## 2018-12-27 DIAGNOSIS — N186 End stage renal disease: Secondary | ICD-10-CM | POA: Diagnosis not present

## 2018-12-27 DIAGNOSIS — D631 Anemia in chronic kidney disease: Secondary | ICD-10-CM | POA: Diagnosis not present

## 2018-12-28 DIAGNOSIS — D631 Anemia in chronic kidney disease: Secondary | ICD-10-CM | POA: Diagnosis not present

## 2018-12-28 DIAGNOSIS — N186 End stage renal disease: Secondary | ICD-10-CM | POA: Diagnosis not present

## 2018-12-28 DIAGNOSIS — D509 Iron deficiency anemia, unspecified: Secondary | ICD-10-CM | POA: Diagnosis not present

## 2018-12-28 DIAGNOSIS — N2581 Secondary hyperparathyroidism of renal origin: Secondary | ICD-10-CM | POA: Diagnosis not present

## 2018-12-29 DIAGNOSIS — N186 End stage renal disease: Secondary | ICD-10-CM | POA: Diagnosis not present

## 2018-12-29 DIAGNOSIS — D509 Iron deficiency anemia, unspecified: Secondary | ICD-10-CM | POA: Diagnosis not present

## 2018-12-29 DIAGNOSIS — N2581 Secondary hyperparathyroidism of renal origin: Secondary | ICD-10-CM | POA: Diagnosis not present

## 2018-12-29 DIAGNOSIS — D631 Anemia in chronic kidney disease: Secondary | ICD-10-CM | POA: Diagnosis not present

## 2018-12-30 DIAGNOSIS — D509 Iron deficiency anemia, unspecified: Secondary | ICD-10-CM | POA: Diagnosis not present

## 2018-12-30 DIAGNOSIS — N2581 Secondary hyperparathyroidism of renal origin: Secondary | ICD-10-CM | POA: Diagnosis not present

## 2018-12-30 DIAGNOSIS — D631 Anemia in chronic kidney disease: Secondary | ICD-10-CM | POA: Diagnosis not present

## 2018-12-30 DIAGNOSIS — N186 End stage renal disease: Secondary | ICD-10-CM | POA: Diagnosis not present

## 2018-12-31 DIAGNOSIS — N186 End stage renal disease: Secondary | ICD-10-CM | POA: Diagnosis not present

## 2018-12-31 DIAGNOSIS — N2581 Secondary hyperparathyroidism of renal origin: Secondary | ICD-10-CM | POA: Diagnosis not present

## 2018-12-31 DIAGNOSIS — D509 Iron deficiency anemia, unspecified: Secondary | ICD-10-CM | POA: Diagnosis not present

## 2018-12-31 DIAGNOSIS — D631 Anemia in chronic kidney disease: Secondary | ICD-10-CM | POA: Diagnosis not present

## 2019-01-01 DIAGNOSIS — N2581 Secondary hyperparathyroidism of renal origin: Secondary | ICD-10-CM | POA: Diagnosis not present

## 2019-01-01 DIAGNOSIS — D631 Anemia in chronic kidney disease: Secondary | ICD-10-CM | POA: Diagnosis not present

## 2019-01-01 DIAGNOSIS — D509 Iron deficiency anemia, unspecified: Secondary | ICD-10-CM | POA: Diagnosis not present

## 2019-01-01 DIAGNOSIS — N186 End stage renal disease: Secondary | ICD-10-CM | POA: Diagnosis not present

## 2019-01-02 DIAGNOSIS — N186 End stage renal disease: Secondary | ICD-10-CM | POA: Diagnosis not present

## 2019-01-02 DIAGNOSIS — D631 Anemia in chronic kidney disease: Secondary | ICD-10-CM | POA: Diagnosis not present

## 2019-01-02 DIAGNOSIS — N2581 Secondary hyperparathyroidism of renal origin: Secondary | ICD-10-CM | POA: Diagnosis not present

## 2019-01-02 DIAGNOSIS — D509 Iron deficiency anemia, unspecified: Secondary | ICD-10-CM | POA: Diagnosis not present

## 2019-01-03 DIAGNOSIS — N2581 Secondary hyperparathyroidism of renal origin: Secondary | ICD-10-CM | POA: Diagnosis not present

## 2019-01-03 DIAGNOSIS — N186 End stage renal disease: Secondary | ICD-10-CM | POA: Diagnosis not present

## 2019-01-03 DIAGNOSIS — D509 Iron deficiency anemia, unspecified: Secondary | ICD-10-CM | POA: Diagnosis not present

## 2019-01-03 DIAGNOSIS — D631 Anemia in chronic kidney disease: Secondary | ICD-10-CM | POA: Diagnosis not present

## 2019-01-04 DIAGNOSIS — N2581 Secondary hyperparathyroidism of renal origin: Secondary | ICD-10-CM | POA: Diagnosis not present

## 2019-01-04 DIAGNOSIS — N186 End stage renal disease: Secondary | ICD-10-CM | POA: Diagnosis not present

## 2019-01-05 DIAGNOSIS — N186 End stage renal disease: Secondary | ICD-10-CM | POA: Diagnosis not present

## 2019-01-05 DIAGNOSIS — N2581 Secondary hyperparathyroidism of renal origin: Secondary | ICD-10-CM | POA: Diagnosis not present

## 2019-01-06 DIAGNOSIS — N2581 Secondary hyperparathyroidism of renal origin: Secondary | ICD-10-CM | POA: Diagnosis not present

## 2019-01-06 DIAGNOSIS — N186 End stage renal disease: Secondary | ICD-10-CM | POA: Diagnosis not present

## 2019-01-07 DIAGNOSIS — N186 End stage renal disease: Secondary | ICD-10-CM | POA: Diagnosis not present

## 2019-01-07 DIAGNOSIS — N2581 Secondary hyperparathyroidism of renal origin: Secondary | ICD-10-CM | POA: Diagnosis not present

## 2019-01-08 DIAGNOSIS — N186 End stage renal disease: Secondary | ICD-10-CM | POA: Diagnosis not present

## 2019-01-08 DIAGNOSIS — N2581 Secondary hyperparathyroidism of renal origin: Secondary | ICD-10-CM | POA: Diagnosis not present

## 2019-01-09 DIAGNOSIS — N2581 Secondary hyperparathyroidism of renal origin: Secondary | ICD-10-CM | POA: Diagnosis not present

## 2019-01-09 DIAGNOSIS — N186 End stage renal disease: Secondary | ICD-10-CM | POA: Diagnosis not present

## 2019-01-10 DIAGNOSIS — N2581 Secondary hyperparathyroidism of renal origin: Secondary | ICD-10-CM | POA: Diagnosis not present

## 2019-01-10 DIAGNOSIS — N186 End stage renal disease: Secondary | ICD-10-CM | POA: Diagnosis not present

## 2019-01-11 DIAGNOSIS — N2581 Secondary hyperparathyroidism of renal origin: Secondary | ICD-10-CM | POA: Diagnosis not present

## 2019-01-11 DIAGNOSIS — N186 End stage renal disease: Secondary | ICD-10-CM | POA: Diagnosis not present

## 2019-01-12 DIAGNOSIS — N2581 Secondary hyperparathyroidism of renal origin: Secondary | ICD-10-CM | POA: Diagnosis not present

## 2019-01-12 DIAGNOSIS — N186 End stage renal disease: Secondary | ICD-10-CM | POA: Diagnosis not present

## 2019-01-13 DIAGNOSIS — N186 End stage renal disease: Secondary | ICD-10-CM | POA: Diagnosis not present

## 2019-01-13 DIAGNOSIS — N2581 Secondary hyperparathyroidism of renal origin: Secondary | ICD-10-CM | POA: Diagnosis not present

## 2019-01-14 DIAGNOSIS — N186 End stage renal disease: Secondary | ICD-10-CM | POA: Diagnosis not present

## 2019-01-15 DIAGNOSIS — N186 End stage renal disease: Secondary | ICD-10-CM | POA: Diagnosis not present

## 2019-01-16 DIAGNOSIS — N186 End stage renal disease: Secondary | ICD-10-CM | POA: Diagnosis not present

## 2019-01-17 DIAGNOSIS — N186 End stage renal disease: Secondary | ICD-10-CM | POA: Diagnosis not present

## 2019-01-18 DIAGNOSIS — N186 End stage renal disease: Secondary | ICD-10-CM | POA: Diagnosis not present

## 2019-01-19 DIAGNOSIS — N186 End stage renal disease: Secondary | ICD-10-CM | POA: Diagnosis not present

## 2019-01-20 DIAGNOSIS — N186 End stage renal disease: Secondary | ICD-10-CM | POA: Diagnosis not present

## 2019-01-21 DIAGNOSIS — N186 End stage renal disease: Secondary | ICD-10-CM | POA: Diagnosis not present

## 2019-01-22 DIAGNOSIS — N186 End stage renal disease: Secondary | ICD-10-CM | POA: Diagnosis not present

## 2019-01-23 DIAGNOSIS — N186 End stage renal disease: Secondary | ICD-10-CM | POA: Diagnosis not present

## 2019-01-24 DIAGNOSIS — Z992 Dependence on renal dialysis: Secondary | ICD-10-CM | POA: Diagnosis not present

## 2019-01-24 DIAGNOSIS — N186 End stage renal disease: Secondary | ICD-10-CM | POA: Diagnosis not present

## 2019-01-25 DIAGNOSIS — D631 Anemia in chronic kidney disease: Secondary | ICD-10-CM | POA: Diagnosis not present

## 2019-01-25 DIAGNOSIS — N186 End stage renal disease: Secondary | ICD-10-CM | POA: Diagnosis not present

## 2019-01-25 DIAGNOSIS — D509 Iron deficiency anemia, unspecified: Secondary | ICD-10-CM | POA: Diagnosis not present

## 2019-01-25 DIAGNOSIS — N2581 Secondary hyperparathyroidism of renal origin: Secondary | ICD-10-CM | POA: Diagnosis not present

## 2019-01-26 DIAGNOSIS — D631 Anemia in chronic kidney disease: Secondary | ICD-10-CM | POA: Diagnosis not present

## 2019-01-26 DIAGNOSIS — D509 Iron deficiency anemia, unspecified: Secondary | ICD-10-CM | POA: Diagnosis not present

## 2019-01-26 DIAGNOSIS — N186 End stage renal disease: Secondary | ICD-10-CM | POA: Diagnosis not present

## 2019-01-26 DIAGNOSIS — N2581 Secondary hyperparathyroidism of renal origin: Secondary | ICD-10-CM | POA: Diagnosis not present

## 2019-01-27 DIAGNOSIS — D631 Anemia in chronic kidney disease: Secondary | ICD-10-CM | POA: Diagnosis not present

## 2019-01-27 DIAGNOSIS — N2581 Secondary hyperparathyroidism of renal origin: Secondary | ICD-10-CM | POA: Diagnosis not present

## 2019-01-27 DIAGNOSIS — D509 Iron deficiency anemia, unspecified: Secondary | ICD-10-CM | POA: Diagnosis not present

## 2019-01-27 DIAGNOSIS — N186 End stage renal disease: Secondary | ICD-10-CM | POA: Diagnosis not present

## 2019-01-28 DIAGNOSIS — N2581 Secondary hyperparathyroidism of renal origin: Secondary | ICD-10-CM | POA: Diagnosis not present

## 2019-01-28 DIAGNOSIS — D509 Iron deficiency anemia, unspecified: Secondary | ICD-10-CM | POA: Diagnosis not present

## 2019-01-28 DIAGNOSIS — N186 End stage renal disease: Secondary | ICD-10-CM | POA: Diagnosis not present

## 2019-01-28 DIAGNOSIS — D631 Anemia in chronic kidney disease: Secondary | ICD-10-CM | POA: Diagnosis not present

## 2019-01-29 DIAGNOSIS — N186 End stage renal disease: Secondary | ICD-10-CM | POA: Diagnosis not present

## 2019-01-29 DIAGNOSIS — N2581 Secondary hyperparathyroidism of renal origin: Secondary | ICD-10-CM | POA: Diagnosis not present

## 2019-01-29 DIAGNOSIS — D631 Anemia in chronic kidney disease: Secondary | ICD-10-CM | POA: Diagnosis not present

## 2019-01-29 DIAGNOSIS — D509 Iron deficiency anemia, unspecified: Secondary | ICD-10-CM | POA: Diagnosis not present

## 2019-01-30 DIAGNOSIS — D631 Anemia in chronic kidney disease: Secondary | ICD-10-CM | POA: Diagnosis not present

## 2019-01-30 DIAGNOSIS — D509 Iron deficiency anemia, unspecified: Secondary | ICD-10-CM | POA: Diagnosis not present

## 2019-01-30 DIAGNOSIS — N186 End stage renal disease: Secondary | ICD-10-CM | POA: Diagnosis not present

## 2019-01-30 DIAGNOSIS — N2581 Secondary hyperparathyroidism of renal origin: Secondary | ICD-10-CM | POA: Diagnosis not present

## 2019-01-31 DIAGNOSIS — D509 Iron deficiency anemia, unspecified: Secondary | ICD-10-CM | POA: Diagnosis not present

## 2019-01-31 DIAGNOSIS — N2581 Secondary hyperparathyroidism of renal origin: Secondary | ICD-10-CM | POA: Diagnosis not present

## 2019-01-31 DIAGNOSIS — D631 Anemia in chronic kidney disease: Secondary | ICD-10-CM | POA: Diagnosis not present

## 2019-01-31 DIAGNOSIS — N186 End stage renal disease: Secondary | ICD-10-CM | POA: Diagnosis not present

## 2019-02-01 DIAGNOSIS — N2581 Secondary hyperparathyroidism of renal origin: Secondary | ICD-10-CM | POA: Diagnosis not present

## 2019-02-01 DIAGNOSIS — D631 Anemia in chronic kidney disease: Secondary | ICD-10-CM | POA: Diagnosis not present

## 2019-02-01 DIAGNOSIS — D509 Iron deficiency anemia, unspecified: Secondary | ICD-10-CM | POA: Diagnosis not present

## 2019-02-01 DIAGNOSIS — N186 End stage renal disease: Secondary | ICD-10-CM | POA: Diagnosis not present

## 2019-02-02 DIAGNOSIS — D631 Anemia in chronic kidney disease: Secondary | ICD-10-CM | POA: Diagnosis not present

## 2019-02-02 DIAGNOSIS — N186 End stage renal disease: Secondary | ICD-10-CM | POA: Diagnosis not present

## 2019-02-02 DIAGNOSIS — D509 Iron deficiency anemia, unspecified: Secondary | ICD-10-CM | POA: Diagnosis not present

## 2019-02-02 DIAGNOSIS — N2581 Secondary hyperparathyroidism of renal origin: Secondary | ICD-10-CM | POA: Diagnosis not present

## 2019-02-03 DIAGNOSIS — N186 End stage renal disease: Secondary | ICD-10-CM | POA: Diagnosis not present

## 2019-02-03 DIAGNOSIS — N2581 Secondary hyperparathyroidism of renal origin: Secondary | ICD-10-CM | POA: Diagnosis not present

## 2019-02-03 DIAGNOSIS — D509 Iron deficiency anemia, unspecified: Secondary | ICD-10-CM | POA: Diagnosis not present

## 2019-02-03 DIAGNOSIS — D631 Anemia in chronic kidney disease: Secondary | ICD-10-CM | POA: Diagnosis not present

## 2019-02-03 DIAGNOSIS — E119 Type 2 diabetes mellitus without complications: Secondary | ICD-10-CM | POA: Diagnosis not present

## 2019-02-04 DIAGNOSIS — N2581 Secondary hyperparathyroidism of renal origin: Secondary | ICD-10-CM | POA: Diagnosis not present

## 2019-02-04 DIAGNOSIS — N186 End stage renal disease: Secondary | ICD-10-CM | POA: Diagnosis not present

## 2019-02-05 DIAGNOSIS — N2581 Secondary hyperparathyroidism of renal origin: Secondary | ICD-10-CM | POA: Diagnosis not present

## 2019-02-05 DIAGNOSIS — N186 End stage renal disease: Secondary | ICD-10-CM | POA: Diagnosis not present

## 2019-02-06 DIAGNOSIS — N186 End stage renal disease: Secondary | ICD-10-CM | POA: Diagnosis not present

## 2019-02-06 DIAGNOSIS — N2581 Secondary hyperparathyroidism of renal origin: Secondary | ICD-10-CM | POA: Diagnosis not present

## 2019-02-07 DIAGNOSIS — N186 End stage renal disease: Secondary | ICD-10-CM | POA: Diagnosis not present

## 2019-02-07 DIAGNOSIS — N2581 Secondary hyperparathyroidism of renal origin: Secondary | ICD-10-CM | POA: Diagnosis not present

## 2019-02-08 DIAGNOSIS — N186 End stage renal disease: Secondary | ICD-10-CM | POA: Diagnosis not present

## 2019-02-08 DIAGNOSIS — N2581 Secondary hyperparathyroidism of renal origin: Secondary | ICD-10-CM | POA: Diagnosis not present

## 2019-02-09 DIAGNOSIS — N186 End stage renal disease: Secondary | ICD-10-CM | POA: Diagnosis not present

## 2019-02-09 DIAGNOSIS — N2581 Secondary hyperparathyroidism of renal origin: Secondary | ICD-10-CM | POA: Diagnosis not present

## 2019-02-10 DIAGNOSIS — N186 End stage renal disease: Secondary | ICD-10-CM | POA: Diagnosis not present

## 2019-02-10 DIAGNOSIS — N2581 Secondary hyperparathyroidism of renal origin: Secondary | ICD-10-CM | POA: Diagnosis not present

## 2019-02-11 DIAGNOSIS — N186 End stage renal disease: Secondary | ICD-10-CM | POA: Diagnosis not present

## 2019-02-11 DIAGNOSIS — N2581 Secondary hyperparathyroidism of renal origin: Secondary | ICD-10-CM | POA: Diagnosis not present

## 2019-02-12 DIAGNOSIS — N186 End stage renal disease: Secondary | ICD-10-CM | POA: Diagnosis not present

## 2019-02-12 DIAGNOSIS — N2581 Secondary hyperparathyroidism of renal origin: Secondary | ICD-10-CM | POA: Diagnosis not present

## 2019-02-13 DIAGNOSIS — N186 End stage renal disease: Secondary | ICD-10-CM | POA: Diagnosis not present

## 2019-02-13 DIAGNOSIS — N2581 Secondary hyperparathyroidism of renal origin: Secondary | ICD-10-CM | POA: Diagnosis not present

## 2019-02-14 DIAGNOSIS — N186 End stage renal disease: Secondary | ICD-10-CM | POA: Diagnosis not present

## 2019-02-15 DIAGNOSIS — N186 End stage renal disease: Secondary | ICD-10-CM | POA: Diagnosis not present

## 2019-02-16 DIAGNOSIS — S81811A Laceration without foreign body, right lower leg, initial encounter: Secondary | ICD-10-CM | POA: Diagnosis not present

## 2019-02-16 DIAGNOSIS — N186 End stage renal disease: Secondary | ICD-10-CM | POA: Diagnosis not present

## 2019-02-16 DIAGNOSIS — S8991XA Unspecified injury of right lower leg, initial encounter: Secondary | ICD-10-CM | POA: Diagnosis not present

## 2019-02-16 DIAGNOSIS — I10 Essential (primary) hypertension: Secondary | ICD-10-CM | POA: Diagnosis not present

## 2019-02-16 DIAGNOSIS — S81812A Laceration without foreign body, left lower leg, initial encounter: Secondary | ICD-10-CM | POA: Diagnosis not present

## 2019-02-16 DIAGNOSIS — I959 Hypotension, unspecified: Secondary | ICD-10-CM | POA: Diagnosis not present

## 2019-02-16 DIAGNOSIS — Y999 Unspecified external cause status: Secondary | ICD-10-CM | POA: Diagnosis not present

## 2019-02-16 DIAGNOSIS — W260XXA Contact with knife, initial encounter: Secondary | ICD-10-CM | POA: Diagnosis not present

## 2019-02-16 DIAGNOSIS — R58 Hemorrhage, not elsewhere classified: Secondary | ICD-10-CM | POA: Diagnosis not present

## 2019-02-16 DIAGNOSIS — M79661 Pain in right lower leg: Secondary | ICD-10-CM | POA: Diagnosis not present

## 2019-02-17 DIAGNOSIS — N186 End stage renal disease: Secondary | ICD-10-CM | POA: Diagnosis not present

## 2019-02-18 DIAGNOSIS — N186 End stage renal disease: Secondary | ICD-10-CM | POA: Diagnosis not present

## 2019-02-19 DIAGNOSIS — N186 End stage renal disease: Secondary | ICD-10-CM | POA: Diagnosis not present

## 2019-02-20 DIAGNOSIS — N186 End stage renal disease: Secondary | ICD-10-CM | POA: Diagnosis not present

## 2019-02-21 DIAGNOSIS — N186 End stage renal disease: Secondary | ICD-10-CM | POA: Diagnosis not present

## 2019-02-22 DIAGNOSIS — N186 End stage renal disease: Secondary | ICD-10-CM | POA: Diagnosis not present

## 2019-02-23 DIAGNOSIS — N186 End stage renal disease: Secondary | ICD-10-CM | POA: Diagnosis not present

## 2019-02-24 DIAGNOSIS — N186 End stage renal disease: Secondary | ICD-10-CM | POA: Diagnosis not present

## 2019-02-24 DIAGNOSIS — Z992 Dependence on renal dialysis: Secondary | ICD-10-CM | POA: Diagnosis not present

## 2019-02-25 ENCOUNTER — Ambulatory Visit (INDEPENDENT_AMBULATORY_CARE_PROVIDER_SITE_OTHER): Payer: Medicare HMO | Admitting: Internal Medicine

## 2019-02-25 ENCOUNTER — Other Ambulatory Visit: Payer: Self-pay

## 2019-02-25 ENCOUNTER — Encounter: Payer: Self-pay | Admitting: Internal Medicine

## 2019-02-25 DIAGNOSIS — S81811D Laceration without foreign body, right lower leg, subsequent encounter: Secondary | ICD-10-CM | POA: Diagnosis not present

## 2019-02-25 DIAGNOSIS — I10 Essential (primary) hypertension: Secondary | ICD-10-CM | POA: Diagnosis not present

## 2019-02-25 DIAGNOSIS — D631 Anemia in chronic kidney disease: Secondary | ICD-10-CM | POA: Diagnosis not present

## 2019-02-25 DIAGNOSIS — N2581 Secondary hyperparathyroidism of renal origin: Secondary | ICD-10-CM | POA: Diagnosis not present

## 2019-02-25 DIAGNOSIS — D509 Iron deficiency anemia, unspecified: Secondary | ICD-10-CM | POA: Diagnosis not present

## 2019-02-25 DIAGNOSIS — N186 End stage renal disease: Secondary | ICD-10-CM | POA: Diagnosis not present

## 2019-02-25 NOTE — Patient Instructions (Signed)
Keep wound

## 2019-02-25 NOTE — Progress Notes (Signed)
Subjective:  Patient ID: Mary Valencia, female    DOB: 02-25-1956  Age: 63 y.o. MRN: [id number]  CC: No chief complaint on file.   HPI Mary Valencia presents for a knife lacerations on the R lower leg x3.  She is status post laceration repair with sutures on 02/16/2019  Outpatient Medications Prior to Visit  Medication Sig Dispense Refill  . ACCU-CHEK FASTCLIX LANCETS MISC Use to check blood glucose TID (DX: E11.8, Z79.4) 300 each 3  . Alcohol Swabs (B-D SINGLE USE SWABS REGULAR) PADS Use as directed 100 each 3  . Blood Glucose Calibration (ACCU-CHEK SMARTVIEW CONTROL) LIQD Use as directed 1 each 1  . Blood Glucose Monitoring Suppl (ACCU-CHEK NANO SMARTVIEW) w/Device KIT Use to check blood glucose TID (DX: E11.8, Z79.4) 1 kit 0  . calcium carbonate (TUMS - DOSED IN MG ELEMENTAL CALCIUM) 500 MG chewable tablet     . CALCIUM-MAGNESIUM-VITAMIN D PO Take 1 tablet by mouth daily.    . diphenhydrAMINE (BENADRYL) 25 mg capsule Take 25 mg by mouth as needed.    Marland Kitchen glucose blood test strip TEST BLOOD GLUCOSE THREE TIMES DAILY 300 each 3  . insulin NPH-regular Human (NOVOLIN 70/30) (70-30) 100 UNIT/ML injection Inject 10 Units into the skin 2 (two) times daily.    Marland Kitchen losartan (COZAAR) 100 MG tablet Take 100 mg by mouth daily.    Marland Kitchen NIFEdipine (PROCARDIA XL/ADALAT-CC) 60 MG 24 hr tablet 2 (two) times daily.     Marland Kitchen omeprazole (PRILOSEC) 40 MG capsule Take 1 capsule (40 mg total) by mouth daily. 1 capsule 3  . polyethylene glycol (MIRALAX / GLYCOLAX) packet Take 17 g by mouth.    . rosuvastatin (CRESTOR) 20 MG tablet Take 1 tablet (20 mg total) by mouth daily. -- Office visit needed for further refills 90 tablet 0  . senna-docusate (SENOKOT-S) 8.6-50 MG tablet Take by mouth 2 (two) times daily. Take 2 tab twice daily.    Marland Kitchen torsemide (DEMADEX) 20 MG tablet Take 40 mg by mouth daily.     Marland Kitchen labetalol (NORMODYNE) 200 MG tablet Take 1 tablet (200 mg total) by mouth 3 (three) times daily. (Patient taking  differently: Take 300 mg by mouth 3 (three) times daily. ) 90 tablet 0   No facility-administered medications prior to visit.     ROS: Review of Systems  Constitutional: Positive for fever.  Skin: Positive for wound. Negative for color change.    Objective:  BP 132/80 (BP Location: Left Arm, Patient Position: Sitting, Cuff Size: Normal)   Pulse 68   Temp 98.2 F (36.8 C) (Oral)   Ht '5\' 2"'$  (1.575 m)   Wt 156 lb (70.8 kg)   SpO2 95%   BMI 28.53 kg/m   BP Readings from Last 3 Encounters:  02/25/19 132/80  04/02/18 (!) 146/84  09/21/17 128/62    Wt Readings from Last 3 Encounters:  02/25/19 156 lb (70.8 kg)  04/02/18 144 lb (65.3 kg)  09/21/17 139 lb (63 kg)    Physical Exam Constitutional:      Appearance: Normal appearance. She is obese.  Cardiovascular:     Rate and Rhythm: Regular rhythm.  Pulmonary:     Breath sounds: No rales.  Abdominal:     Tenderness: There is no abdominal tenderness.  Skin:    General: Skin is dry.     Findings: No bruising, erythema or rash.  Psychiatric:        Mood and Affect: Mood normal.  Distal RLE lacerations x 3.  The sutures are in place.  No erythema. I am still able to separate wound edges and some areas  Lacerations were cleaned and dressed with Telfa.  Ace wrap applied Lab Results  Component Value Date   WBC 7.7 08/29/2017   HGB 29.1 09/01/2017   HCT 30 (A) 08/29/2017   PLT 203 08/29/2017   GLUCOSE 279 (H) 09/21/2017   CHOL 161 09/21/2017   TRIG 72.0 09/21/2017   HDL 63.60 09/21/2017   LDLDIRECT 207.8 09/29/2010   LDLCALC 83 09/21/2017   ALT 6 09/21/2017   AST 21 09/21/2017   NA 141 09/21/2017   K 4.9 09/21/2017   CL 98 09/21/2017   CREATININE 13.49 (HH) 09/21/2017   BUN 66 (H) 09/21/2017   CO2 27 09/21/2017   TSH 2.81 09/21/2017   HGBA1C 7.1 (H) 09/21/2017    [place] Lt Lower Extrem Ltd Soft Tissue Non Vascular  Result Date: 08/30/2017 CLINICAL DATA:  Ganglion cyst. Lump on the LEFT LATERAL LOWER leg for  7 weeks. Masses occasionally tender and varies in size. EXAM: ULTRASOUND LEFT LOWER EXTREMITY LIMITED TECHNIQUE: Ultrasound examination of the lower extremity soft tissues was performed in the area of clinical concern. COMPARISON:  None. FINDINGS: Soft Tissue Structures: There is mild edema within the subcutaneous tissue in the area of patient's concern. No mass or fluid collection. No cystic abnormality. IMPRESSION: Mild soft tissue edema.  No mass. Electronically Signed   By: Nolon Nations M.D.   On: 08/30/2017 17:03    Assessment & Plan:   There are no diagnoses linked to this encounter.   No orders of the defined types were placed in this encounter.    Follow-up: No follow-ups on file.  Walker Kehr, MD

## 2019-02-26 ENCOUNTER — Encounter: Payer: Self-pay | Admitting: Internal Medicine

## 2019-02-26 DIAGNOSIS — S81811A Laceration without foreign body, right lower leg, initial encounter: Secondary | ICD-10-CM | POA: Insufficient documentation

## 2019-02-26 DIAGNOSIS — D631 Anemia in chronic kidney disease: Secondary | ICD-10-CM | POA: Diagnosis not present

## 2019-02-26 DIAGNOSIS — D509 Iron deficiency anemia, unspecified: Secondary | ICD-10-CM | POA: Diagnosis not present

## 2019-02-26 DIAGNOSIS — N2581 Secondary hyperparathyroidism of renal origin: Secondary | ICD-10-CM | POA: Diagnosis not present

## 2019-02-26 DIAGNOSIS — N186 End stage renal disease: Secondary | ICD-10-CM | POA: Diagnosis not present

## 2019-02-26 DIAGNOSIS — E119 Type 2 diabetes mellitus without complications: Secondary | ICD-10-CM | POA: Diagnosis not present

## 2019-02-26 NOTE — Assessment & Plan Note (Signed)
Check blood pressure at home.

## 2019-02-26 NOTE — Assessment & Plan Note (Signed)
02/16/19 lacerations on the R lower leg x3.  Status post laceration repair.  The healing is not complete yet.  Wounds are redressed.  Wound care discussed.  Come back on Monday for suture removal

## 2019-02-27 DIAGNOSIS — N186 End stage renal disease: Secondary | ICD-10-CM | POA: Diagnosis not present

## 2019-02-27 DIAGNOSIS — D631 Anemia in chronic kidney disease: Secondary | ICD-10-CM | POA: Diagnosis not present

## 2019-02-27 DIAGNOSIS — D509 Iron deficiency anemia, unspecified: Secondary | ICD-10-CM | POA: Diagnosis not present

## 2019-02-27 DIAGNOSIS — N2581 Secondary hyperparathyroidism of renal origin: Secondary | ICD-10-CM | POA: Diagnosis not present

## 2019-02-28 DIAGNOSIS — N2581 Secondary hyperparathyroidism of renal origin: Secondary | ICD-10-CM | POA: Diagnosis not present

## 2019-02-28 DIAGNOSIS — N186 End stage renal disease: Secondary | ICD-10-CM | POA: Diagnosis not present

## 2019-02-28 DIAGNOSIS — D631 Anemia in chronic kidney disease: Secondary | ICD-10-CM | POA: Diagnosis not present

## 2019-02-28 DIAGNOSIS — D509 Iron deficiency anemia, unspecified: Secondary | ICD-10-CM | POA: Diagnosis not present

## 2019-03-01 DIAGNOSIS — D509 Iron deficiency anemia, unspecified: Secondary | ICD-10-CM | POA: Diagnosis not present

## 2019-03-01 DIAGNOSIS — D631 Anemia in chronic kidney disease: Secondary | ICD-10-CM | POA: Diagnosis not present

## 2019-03-01 DIAGNOSIS — N186 End stage renal disease: Secondary | ICD-10-CM | POA: Diagnosis not present

## 2019-03-01 DIAGNOSIS — N2581 Secondary hyperparathyroidism of renal origin: Secondary | ICD-10-CM | POA: Diagnosis not present

## 2019-03-02 DIAGNOSIS — D631 Anemia in chronic kidney disease: Secondary | ICD-10-CM | POA: Diagnosis not present

## 2019-03-02 DIAGNOSIS — D509 Iron deficiency anemia, unspecified: Secondary | ICD-10-CM | POA: Diagnosis not present

## 2019-03-02 DIAGNOSIS — N186 End stage renal disease: Secondary | ICD-10-CM | POA: Diagnosis not present

## 2019-03-02 DIAGNOSIS — N2581 Secondary hyperparathyroidism of renal origin: Secondary | ICD-10-CM | POA: Diagnosis not present

## 2019-03-03 DIAGNOSIS — D631 Anemia in chronic kidney disease: Secondary | ICD-10-CM | POA: Diagnosis not present

## 2019-03-03 DIAGNOSIS — N2581 Secondary hyperparathyroidism of renal origin: Secondary | ICD-10-CM | POA: Diagnosis not present

## 2019-03-03 DIAGNOSIS — D509 Iron deficiency anemia, unspecified: Secondary | ICD-10-CM | POA: Diagnosis not present

## 2019-03-03 DIAGNOSIS — N186 End stage renal disease: Secondary | ICD-10-CM | POA: Diagnosis not present

## 2019-03-04 ENCOUNTER — Other Ambulatory Visit: Payer: Self-pay

## 2019-03-04 ENCOUNTER — Ambulatory Visit (INDEPENDENT_AMBULATORY_CARE_PROVIDER_SITE_OTHER): Payer: Medicare HMO | Admitting: Internal Medicine

## 2019-03-04 ENCOUNTER — Encounter: Payer: Self-pay | Admitting: Internal Medicine

## 2019-03-04 DIAGNOSIS — D509 Iron deficiency anemia, unspecified: Secondary | ICD-10-CM | POA: Diagnosis not present

## 2019-03-04 DIAGNOSIS — S81811D Laceration without foreign body, right lower leg, subsequent encounter: Secondary | ICD-10-CM | POA: Diagnosis not present

## 2019-03-04 DIAGNOSIS — N2581 Secondary hyperparathyroidism of renal origin: Secondary | ICD-10-CM | POA: Diagnosis not present

## 2019-03-04 DIAGNOSIS — N186 End stage renal disease: Secondary | ICD-10-CM | POA: Diagnosis not present

## 2019-03-04 DIAGNOSIS — D631 Anemia in chronic kidney disease: Secondary | ICD-10-CM | POA: Diagnosis not present

## 2019-03-04 NOTE — Assessment & Plan Note (Signed)
Right distal shin sutures were removed.  Wound edges of both lacerations have separated.  I elected to use dermal glue to approximate wound edges and hold them together.  It was accomplished in a sterile technique.  There was dressed with Ace wrap for protection from mechanical injury. FTF 16 min

## 2019-03-04 NOTE — Progress Notes (Signed)
Subjective:  Patient ID: Mary Valencia, female    DOB: 1955/10/25  Age: 63 y.o. MRN: [id number]  CC: No chief complaint on file.   HPI Mary Valencia presents for a suture removal  Outpatient Medications Prior to Visit  Medication Sig Dispense Refill  . ACCU-CHEK FASTCLIX LANCETS MISC Use to check blood glucose TID (DX: E11.8, Z79.4) 300 each 3  . Alcohol Swabs (B-D SINGLE USE SWABS REGULAR) PADS Use as directed 100 each 3  . Blood Glucose Calibration (ACCU-CHEK SMARTVIEW CONTROL) LIQD Use as directed 1 each 1  . Blood Glucose Monitoring Suppl (ACCU-CHEK NANO SMARTVIEW) w/Device KIT Use to check blood glucose TID (DX: E11.8, Z79.4) 1 kit 0  . calcium carbonate (TUMS - DOSED IN MG ELEMENTAL CALCIUM) 500 MG chewable tablet     . CALCIUM-MAGNESIUM-VITAMIN D PO Take 1 tablet by mouth daily.    . diphenhydrAMINE (BENADRYL) 25 mg capsule Take 25 mg by mouth as needed.    Marland Kitchen glucose blood test strip TEST BLOOD GLUCOSE THREE TIMES DAILY 300 each 3  . insulin NPH-regular Human (NOVOLIN 70/30) (70-30) 100 UNIT/ML injection Inject 10 Units into the skin 2 (two) times daily.    Marland Kitchen losartan (COZAAR) 100 MG tablet Take 100 mg by mouth daily.    Marland Kitchen NIFEdipine (PROCARDIA XL/ADALAT-CC) 60 MG 24 hr tablet 2 (two) times daily.     Marland Kitchen omeprazole (PRILOSEC) 40 MG capsule Take 1 capsule (40 mg total) by mouth daily. 1 capsule 3  . polyethylene glycol (MIRALAX / GLYCOLAX) packet Take 17 g by mouth.    . rosuvastatin (CRESTOR) 20 MG tablet Take 1 tablet (20 mg total) by mouth daily. -- Office visit needed for further refills 90 tablet 0  . senna-docusate (SENOKOT-S) 8.6-50 MG tablet Take by mouth 2 (two) times daily. Take 2 tab twice daily.    Marland Kitchen torsemide (DEMADEX) 20 MG tablet Take 40 mg by mouth daily.     Marland Kitchen labetalol (NORMODYNE) 200 MG tablet Take 1 tablet (200 mg total) by mouth 3 (three) times daily. (Patient taking differently: Take 300 mg by mouth 3 (three) times daily. ) 90 tablet 0   No  facility-administered medications prior to visit.     ROS: Review of Systems  Objective:  BP (!) 144/72 (BP Location: Left Arm, Patient Position: Sitting, Cuff Size: Normal)   Pulse 71   Temp 98.1 F (36.7 C) (Oral)   Ht '5\' 2"'$  (1.575 m)   Wt 152 lb (68.9 kg)   SpO2 93%   BMI 27.80 kg/m   BP Readings from Last 3 Encounters:  03/04/19 (!) 144/72  02/25/19 132/80  04/02/18 (!) 146/84    Wt Readings from Last 3 Encounters:  03/04/19 152 lb (68.9 kg)  02/25/19 156 lb (70.8 kg)  04/02/18 144 lb (65.3 kg)    Physical Exam   Right distal shin sutures were removed.  Wound edges of both lacerations have separated.  I elected to use dermal glue to approximate wound edges and hold them together.  It was accomplished in a sterile technique.  There was dressed with Ace wrap for protection from mechanical injury. FTF 16 min  Lab Results  Component Value Date   WBC 7.7 08/29/2017   HGB 29.1 09/01/2017   HCT 30 (A) 08/29/2017   PLT 203 08/29/2017   GLUCOSE 279 (H) 09/21/2017   CHOL 161 09/21/2017   TRIG 72.0 09/21/2017   HDL 63.60 09/21/2017   LDLDIRECT 207.8 09/29/2010   [place]  83 09/21/2017   ALT 6 09/21/2017   AST 21 09/21/2017   NA 141 09/21/2017   K 4.9 09/21/2017   CL 98 09/21/2017   CREATININE 13.49 (HH) 09/21/2017   BUN 66 (H) 09/21/2017   CO2 27 09/21/2017   TSH 2.81 09/21/2017   HGBA1C 7.1 (H) 09/21/2017    [place] Lt Lower Extrem Ltd Soft Tissue Non Vascular  Result Date: 08/30/2017 CLINICAL DATA:  Ganglion cyst. Lump on the LEFT LATERAL LOWER leg for 7 weeks. Masses occasionally tender and varies in size. EXAM: ULTRASOUND LEFT LOWER EXTREMITY LIMITED TECHNIQUE: Ultrasound examination of the lower extremity soft tissues was performed in the area of clinical concern. COMPARISON:  None. FINDINGS: Soft Tissue Structures: There is mild edema within the subcutaneous tissue in the area of patient's concern. No mass or fluid collection. No cystic abnormality. IMPRESSION: Mild  soft tissue edema.  No mass. Electronically Signed   By: Nolon Nations M.D.   On: 08/30/2017 17:03    Assessment & Plan:   There are no diagnoses linked to this encounter.   No orders of the defined types were placed in this encounter.    Follow-up: No follow-ups on file.  Walker Kehr, MD

## 2019-03-05 DIAGNOSIS — D631 Anemia in chronic kidney disease: Secondary | ICD-10-CM | POA: Diagnosis not present

## 2019-03-05 DIAGNOSIS — N186 End stage renal disease: Secondary | ICD-10-CM | POA: Diagnosis not present

## 2019-03-05 DIAGNOSIS — N2581 Secondary hyperparathyroidism of renal origin: Secondary | ICD-10-CM | POA: Diagnosis not present

## 2019-03-05 DIAGNOSIS — D509 Iron deficiency anemia, unspecified: Secondary | ICD-10-CM | POA: Diagnosis not present

## 2019-03-06 DIAGNOSIS — D509 Iron deficiency anemia, unspecified: Secondary | ICD-10-CM | POA: Diagnosis not present

## 2019-03-06 DIAGNOSIS — D631 Anemia in chronic kidney disease: Secondary | ICD-10-CM | POA: Diagnosis not present

## 2019-03-06 DIAGNOSIS — N2581 Secondary hyperparathyroidism of renal origin: Secondary | ICD-10-CM | POA: Diagnosis not present

## 2019-03-06 DIAGNOSIS — N186 End stage renal disease: Secondary | ICD-10-CM | POA: Diagnosis not present

## 2019-03-07 DIAGNOSIS — N186 End stage renal disease: Secondary | ICD-10-CM | POA: Diagnosis not present

## 2019-03-08 DIAGNOSIS — N186 End stage renal disease: Secondary | ICD-10-CM | POA: Diagnosis not present

## 2019-03-09 DIAGNOSIS — N186 End stage renal disease: Secondary | ICD-10-CM | POA: Diagnosis not present

## 2019-03-10 DIAGNOSIS — N186 End stage renal disease: Secondary | ICD-10-CM | POA: Diagnosis not present

## 2019-03-11 DIAGNOSIS — N186 End stage renal disease: Secondary | ICD-10-CM | POA: Diagnosis not present

## 2019-03-12 DIAGNOSIS — N186 End stage renal disease: Secondary | ICD-10-CM | POA: Diagnosis not present

## 2019-03-13 DIAGNOSIS — N186 End stage renal disease: Secondary | ICD-10-CM | POA: Diagnosis not present

## 2019-03-14 DIAGNOSIS — N186 End stage renal disease: Secondary | ICD-10-CM | POA: Diagnosis not present

## 2019-03-15 DIAGNOSIS — N186 End stage renal disease: Secondary | ICD-10-CM | POA: Diagnosis not present

## 2019-03-16 DIAGNOSIS — N186 End stage renal disease: Secondary | ICD-10-CM | POA: Diagnosis not present

## 2019-03-17 DIAGNOSIS — N186 End stage renal disease: Secondary | ICD-10-CM | POA: Diagnosis not present

## 2019-03-18 DIAGNOSIS — N186 End stage renal disease: Secondary | ICD-10-CM | POA: Diagnosis not present

## 2019-03-19 DIAGNOSIS — N186 End stage renal disease: Secondary | ICD-10-CM | POA: Diagnosis not present

## 2019-03-20 DIAGNOSIS — N186 End stage renal disease: Secondary | ICD-10-CM | POA: Diagnosis not present

## 2019-03-21 DIAGNOSIS — N186 End stage renal disease: Secondary | ICD-10-CM | POA: Diagnosis not present

## 2019-03-22 DIAGNOSIS — N186 End stage renal disease: Secondary | ICD-10-CM | POA: Diagnosis not present

## 2019-03-23 DIAGNOSIS — N186 End stage renal disease: Secondary | ICD-10-CM | POA: Diagnosis not present

## 2019-03-24 DIAGNOSIS — N186 End stage renal disease: Secondary | ICD-10-CM | POA: Diagnosis not present

## 2019-03-25 DIAGNOSIS — N186 End stage renal disease: Secondary | ICD-10-CM | POA: Diagnosis not present

## 2019-03-26 DIAGNOSIS — N186 End stage renal disease: Secondary | ICD-10-CM | POA: Diagnosis not present

## 2019-03-26 DIAGNOSIS — Z992 Dependence on renal dialysis: Secondary | ICD-10-CM | POA: Diagnosis not present

## 2019-03-27 DIAGNOSIS — D509 Iron deficiency anemia, unspecified: Secondary | ICD-10-CM | POA: Diagnosis not present

## 2019-03-27 DIAGNOSIS — N2581 Secondary hyperparathyroidism of renal origin: Secondary | ICD-10-CM | POA: Diagnosis not present

## 2019-03-27 DIAGNOSIS — N186 End stage renal disease: Secondary | ICD-10-CM | POA: Diagnosis not present

## 2019-03-27 DIAGNOSIS — D631 Anemia in chronic kidney disease: Secondary | ICD-10-CM | POA: Diagnosis not present

## 2019-03-28 DIAGNOSIS — N2581 Secondary hyperparathyroidism of renal origin: Secondary | ICD-10-CM | POA: Diagnosis not present

## 2019-03-28 DIAGNOSIS — D509 Iron deficiency anemia, unspecified: Secondary | ICD-10-CM | POA: Diagnosis not present

## 2019-03-28 DIAGNOSIS — N186 End stage renal disease: Secondary | ICD-10-CM | POA: Diagnosis not present

## 2019-03-28 DIAGNOSIS — D631 Anemia in chronic kidney disease: Secondary | ICD-10-CM | POA: Diagnosis not present

## 2019-03-29 DIAGNOSIS — N2581 Secondary hyperparathyroidism of renal origin: Secondary | ICD-10-CM | POA: Diagnosis not present

## 2019-03-29 DIAGNOSIS — D509 Iron deficiency anemia, unspecified: Secondary | ICD-10-CM | POA: Diagnosis not present

## 2019-03-29 DIAGNOSIS — N186 End stage renal disease: Secondary | ICD-10-CM | POA: Diagnosis not present

## 2019-03-29 DIAGNOSIS — D631 Anemia in chronic kidney disease: Secondary | ICD-10-CM | POA: Diagnosis not present

## 2019-03-30 DIAGNOSIS — D631 Anemia in chronic kidney disease: Secondary | ICD-10-CM | POA: Diagnosis not present

## 2019-03-30 DIAGNOSIS — N186 End stage renal disease: Secondary | ICD-10-CM | POA: Diagnosis not present

## 2019-03-30 DIAGNOSIS — D509 Iron deficiency anemia, unspecified: Secondary | ICD-10-CM | POA: Diagnosis not present

## 2019-03-30 DIAGNOSIS — N2581 Secondary hyperparathyroidism of renal origin: Secondary | ICD-10-CM | POA: Diagnosis not present

## 2019-03-31 DIAGNOSIS — D509 Iron deficiency anemia, unspecified: Secondary | ICD-10-CM | POA: Diagnosis not present

## 2019-03-31 DIAGNOSIS — N2581 Secondary hyperparathyroidism of renal origin: Secondary | ICD-10-CM | POA: Diagnosis not present

## 2019-03-31 DIAGNOSIS — D631 Anemia in chronic kidney disease: Secondary | ICD-10-CM | POA: Diagnosis not present

## 2019-03-31 DIAGNOSIS — N186 End stage renal disease: Secondary | ICD-10-CM | POA: Diagnosis not present

## 2019-04-01 DIAGNOSIS — D631 Anemia in chronic kidney disease: Secondary | ICD-10-CM | POA: Diagnosis not present

## 2019-04-01 DIAGNOSIS — N186 End stage renal disease: Secondary | ICD-10-CM | POA: Diagnosis not present

## 2019-04-01 DIAGNOSIS — D509 Iron deficiency anemia, unspecified: Secondary | ICD-10-CM | POA: Diagnosis not present

## 2019-04-01 DIAGNOSIS — N2581 Secondary hyperparathyroidism of renal origin: Secondary | ICD-10-CM | POA: Diagnosis not present

## 2019-04-02 DIAGNOSIS — N186 End stage renal disease: Secondary | ICD-10-CM | POA: Diagnosis not present

## 2019-04-02 DIAGNOSIS — D509 Iron deficiency anemia, unspecified: Secondary | ICD-10-CM | POA: Diagnosis not present

## 2019-04-02 DIAGNOSIS — D631 Anemia in chronic kidney disease: Secondary | ICD-10-CM | POA: Diagnosis not present

## 2019-04-02 DIAGNOSIS — N2581 Secondary hyperparathyroidism of renal origin: Secondary | ICD-10-CM | POA: Diagnosis not present

## 2019-04-02 DIAGNOSIS — E119 Type 2 diabetes mellitus without complications: Secondary | ICD-10-CM | POA: Diagnosis not present

## 2019-04-03 DIAGNOSIS — D631 Anemia in chronic kidney disease: Secondary | ICD-10-CM | POA: Diagnosis not present

## 2019-04-03 DIAGNOSIS — N186 End stage renal disease: Secondary | ICD-10-CM | POA: Diagnosis not present

## 2019-04-03 DIAGNOSIS — N2581 Secondary hyperparathyroidism of renal origin: Secondary | ICD-10-CM | POA: Diagnosis not present

## 2019-04-03 DIAGNOSIS — D509 Iron deficiency anemia, unspecified: Secondary | ICD-10-CM | POA: Diagnosis not present

## 2019-04-03 NOTE — Progress Notes (Addendum)
Subjective:   Mary Valencia is a 63 y.o. female who presents for Medicare Annual (Subsequent) preventive examination. I connected with patient by a telephone and verified that I am speaking with the correct person using two identifiers. Patient stated full name and DOB. Patient gave permission to continue with telephonic visit. Patient's location was at home and Nurse's location was at [place] office.   Review of Systems:   Cardiac Risk Factors include: advanced age (>66mn, >>56women);diabetes mellitus;hypertension Sleep patterns: sleeps 5-6 hours nightly.    Home Safety/Smoke Alarms: Feels safe in home. Smoke alarms in place.  Living environment; residence and Firearm Safety: 1-story house/ trailer, equipment: Walkers, Type: [employer] fast streamer Type: Tub SSurveyor, quantity Lives with husband, no needs for DME, good support system Seat Belt Safety/Bike Helmet: Wears seat belt.     Objective:     Vitals: There were no vitals taken for this visit.  There is no height or weight on file to calculate BMI.  Advanced Directives 04/04/2019 04/02/2018 11/27/2016 01/29/2012  Does Patient Have a Medical Advance Directive? No No No Patient does not have advance directive  Does patient want to make changes to medical advance directive? Yes (ED - Information included in AVS) Yes (ED - Information included in AVS) - -  Would patient like information on creating a medical advance directive? - - No - Patient declined -    Tobacco Social History   Tobacco Use  Smoking Status Never Smoker  Smokeless Tobacco Never Used     Counseling given: Not Answered  Past Medical History:  Diagnosis Date  . Allergic rhinitis   . 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 Edema 10/27/2012   L>R  . GERD (gastroesophageal reflux disease)   . HTN (hypertension)   . Hyperlipidemia   . Type II or unspecified type diabetes mellitus without mention of complication, not stated as uncontrolled     Past Surgical History:  Procedure Laterality Date  . ABDOMINAL HYSTERECTOMY     partial  . KIDNEY SURGERY     right removed  . NEPHRECTOMY     right - related to untreated strep infection and stones  . TONSILLECTOMY     Family History  Problem Relation Age of Onset  . Heart disease Mother 64      MI  . Heart disease Father 558      MI  . Alcohol abuse Sister   . Kidney disease Sister   . Heart disease Brother        ?CHF ?CAD  . Heart disease Maternal Aunt   . Diabetes Other    Social History   Socioeconomic History  . Marital status: Married    Spouse name: Not on file  . Number of children: 5  . Years of education: Not on file  . Highest education level: Not on file  Occupational History  . Occupation: disability  Social Needs  . Financial resource strain: Somewhat hard  . Food insecurity    Worry: Never true    Inability: Never true  . Transportation needs    Medical: No    Non-medical: No  Tobacco Use  . Smoking status: Never Smoker  . Smokeless tobacco: Never Used  Substance and Sexual Activity  . Alcohol use: No  . Drug use: No  . Sexual activity: Yes  Lifestyle  . Physical activity    Days per week: 0 days    Minutes per  session: 0 min  . Stress: To some extent  Relationships  . Social connections    Talks on phone: More than three times a week    Gets together: More than three times a week    Attends religious service: More than 4 times per year    Active member of club or organization: Yes    Attends meetings of clubs or organizations: More than 4 times per year    Relationship status: Married  Other Topics Concern  . 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 ACCU-CHEK FASTCLIX LANCETS MISC Use to check blood glucose TID (DX: E11.8, Z79.4)  . Alcohol Swabs (B-D SINGLE USE SWABS REGULAR) PADS  Use as directed  . Blood Glucose Calibration (ACCU-CHEK SMARTVIEW CONTROL) LIQD Use as directed  . Blood Glucose Monitoring Suppl (ACCU-CHEK NANO SMARTVIEW) w/Device KIT Use to check blood glucose TID (DX: E11.8, Z79.4)  . calcium carbonate (TUMS - DOSED IN MG ELEMENTAL CALCIUM) 500 MG chewable tablet   . CALCIUM-MAGNESIUM-VITAMIN D PO Take 1 tablet by mouth daily.  . diphenhydrAMINE (BENADRYL) 25 mg capsule Take 25 mg by mouth as needed.  Marland Kitchen glucose blood test strip TEST BLOOD GLUCOSE THREE TIMES DAILY  . insulin NPH-regular Human (NOVOLIN 70/30) (70-30) 100 UNIT/ML injection Inject 10 Units into the skin 2 (two) times daily.  Marland Kitchen losartan (COZAAR) 100 MG tablet Take 100 mg by mouth daily.  Marland Kitchen NIFEdipine (PROCARDIA XL/ADALAT-CC) 60 MG 24 hr tablet 2 (two) times daily.   Marland Kitchen omeprazole (PRILOSEC) 40 MG capsule Take 1 capsule (40 mg total) by mouth daily.  . polyethylene glycol (MIRALAX / GLYCOLAX) packet Take 17 g by mouth.  Marland Kitchen PRESCRIPTION MEDICATION   . rosuvastatin (CRESTOR) 20 MG tablet Take 1 tablet (20 mg total) by mouth daily. -- Office visit needed for further refills  . senna-docusate (SENOKOT-S) 8.6-50 MG tablet Take by mouth 2 (two) times daily. Take 2 tab twice daily.  Marland Kitchen torsemide (DEMADEX) 20 MG tablet Take 40 mg by mouth.   . labetalol (NORMODYNE) 200 MG tablet Take 1 tablet (200 mg total) by mouth 3 (three) times daily. (Patient taking differently: Take 300 mg by mouth 3 (three) times daily. )   No facility-administered encounter medications on file as of 04/04/2019.     Activities of Daily Living In your present state of health, do you have any difficulty performing the following activities: 04/04/2019  Hearing? N  Vision? N  Difficulty concentrating or making decisions? N  Walking or climbing stairs? Y  Dressing or bathing? Y  Doing errands, shopping? Y  Preparing Food and eating ? Y  Using the Toilet? Y  In the past six months, have you accidently leaked urine? N  Comment  dialysis  Do you have problems with loss of bowel control? Y  Managing your Medications? N  Managing your Finances? N  Housekeeping or managing your Housekeeping? Y  Some recent data might be hidden    Patient Care Team: Plotnikov, Evie Lacks, MD as PCP - General Ninfa Linden Lind Guest, MD (Orthopedic Surgery) Boyd Kerbs, MD as Referring Physician (Neurology) Mezer, Nadara Mustard, MD (Obstetrics and Gynecology) Thereasa Distance, MD as Referring Physician (Nephrology) Curly Rim, MD as Referring Physician (Internal Medicine)    Assessment:   This is a routine wellness examination for Mlissa. Physical assessment deferred  to PCP.  Exercise Activities and Dietary recommendations Current Exercise Habits: Home exercise routine, Type of exercise: walking, Time (Minutes): 20, Frequency (Times/Week): 6, Weekly Exercise (Minutes/Week): 120, Intensity: Mild  Diet (meal preparation, eat out, water intake, caffeinated beverages, dairy products, fruits and vegetables): in general, a "healthy" diet   Dialysis patient on strict renal diet and 30 cc per day fluid restriction.  Goals    . Become active again in my church     Volunteer in my church I will am starting this week.     . Patient Stated     Stay as healthy and as independent as possible.       Fall Risk Fall Risk  04/04/2019 04/02/2018 03/28/2017 11/27/2016  Falls in the past year? 0 No Yes Yes  Number falls in past yr: 0 - 1 1  Injury with Fall? 0 - No -  Risk for fall due to : History of fall(s);Impaired balance/gait;Impaired mobility Impaired balance/gait - -  Follow up Falls prevention discussed - - -   Is the patient's home free of loose throw rugs in walkways, pet beds, electrical cords, etc?   yes      Grab bars in the bathroom? yes      Handrails on the stairs?   yes      Adequate lighting?   yes   Depression Screen PHQ 2/9 Scores 04/02/2018 03/28/2017 11/27/2016  PHQ - 2 Score 1 0 0  PHQ- 9 Score 5 - -      Cognitive Function       Ad8 score reviewed for issues:  Issues making decisions: no  Less interest in hobbies / activities: no  Repeats questions, stories (family complaining): no  Trouble using ordinary gadgets (microwave, computer, phone):no  Forgets the month or year: no  Mismanaging finances: no  Remembering appts: no  Daily problems with thinking and/or memory: no Ad8 score is= 0  Immunization History  Administered Date(s) Administered  . Influenza Whole 05/16/2005  . Pneumococcal Polysaccharide-23 10/08/2016  . Td 09/26/2004, 08/25/2011, 02/24/2017   Screening Tests Health Maintenance  Topic Date Due  . OPHTHALMOLOGY EXAM  06/12/1964  . HIV Screening  06/12/1969  . HEMOGLOBIN A1C  03/24/2018  . MAMMOGRAM  10/10/2018  . FOOT EXAM  04/03/2019  . INFLUENZA VACCINE  09/25/2019 (Originally 01/25/2019)  . COLONOSCOPY  12/16/2026  . TETANUS/TDAP  02/25/2027  . Hepatitis C Screening  Completed      Plan:    Reviewed health maintenance screenings with patient today and relevant education, vaccines, and/or referrals were provided.   I have personally reviewed and noted the following in the patient's chart:   . Medical and social history . Use of alcohol, tobacco or illicit drugs  . Current medications and supplements . Functional ability and status . Nutritional status . Physical activity . Advanced directives . List of other physicians . Screenings to include cognitive, depression, and falls . Referrals and appointments  In addition, I have reviewed and discussed with patient certain preventive protocols, quality metrics, and best practice recommendations. A written personalized care plan for preventive services as well as general preventive health recommendations were provided to patient.     Michiel Cowboy, RN  04/04/2019   Medical screening examination/treatment/procedure(s) were performed by non-physician practitioner and as supervising physician I was  immediately available for consultation/collaboration. I agree with above. Binnie Rail, MD

## 2019-04-04 ENCOUNTER — Ambulatory Visit (INDEPENDENT_AMBULATORY_CARE_PROVIDER_SITE_OTHER): Payer: Medicare HMO | Admitting: *Deleted

## 2019-04-04 ENCOUNTER — Telehealth: Payer: Self-pay | Admitting: *Deleted

## 2019-04-04 DIAGNOSIS — N2581 Secondary hyperparathyroidism of renal origin: Secondary | ICD-10-CM | POA: Diagnosis not present

## 2019-04-04 DIAGNOSIS — D509 Iron deficiency anemia, unspecified: Secondary | ICD-10-CM | POA: Diagnosis not present

## 2019-04-04 DIAGNOSIS — D631 Anemia in chronic kidney disease: Secondary | ICD-10-CM | POA: Diagnosis not present

## 2019-04-04 DIAGNOSIS — N186 End stage renal disease: Secondary | ICD-10-CM | POA: Diagnosis not present

## 2019-04-04 DIAGNOSIS — Z0000 Encounter for general adult medical examination without abnormal findings: Secondary | ICD-10-CM

## 2019-04-04 NOTE — Telephone Encounter (Signed)
During AWV, patient stated she wanted to start the process of getting an aide to assist with ADLs in her home. Her insurance company is requesting an order from PCP for an aide to be faxed to her insurance company.

## 2019-04-05 DIAGNOSIS — D631 Anemia in chronic kidney disease: Secondary | ICD-10-CM | POA: Diagnosis not present

## 2019-04-05 DIAGNOSIS — N186 End stage renal disease: Secondary | ICD-10-CM | POA: Diagnosis not present

## 2019-04-05 DIAGNOSIS — N2581 Secondary hyperparathyroidism of renal origin: Secondary | ICD-10-CM | POA: Diagnosis not present

## 2019-04-05 DIAGNOSIS — D509 Iron deficiency anemia, unspecified: Secondary | ICD-10-CM | POA: Diagnosis not present

## 2019-04-05 NOTE — Telephone Encounter (Signed)
Is it for Medicaid? Thx

## 2019-04-06 DIAGNOSIS — N186 End stage renal disease: Secondary | ICD-10-CM | POA: Diagnosis not present

## 2019-04-07 DIAGNOSIS — N186 End stage renal disease: Secondary | ICD-10-CM | POA: Diagnosis not present

## 2019-04-08 DIAGNOSIS — N186 End stage renal disease: Secondary | ICD-10-CM | POA: Diagnosis not present

## 2019-04-08 NOTE — Telephone Encounter (Signed)
This would be sent to [facility] who is currently working with the patient to receive an in home aide. Mary Valencia is in the process of apply for Medicaid and [employer] is aware that she does not currently have it but wants the order now to be able to process the request as quickly as possible once she does receive Medicaid.

## 2019-04-09 DIAGNOSIS — N186 End stage renal disease: Secondary | ICD-10-CM | POA: Diagnosis not present

## 2019-04-09 NOTE — Telephone Encounter (Signed)
Called and spoke to patient and asked her to reach out to her insurance company to have them faxed the paperwork to PCP that needs to be filled out in regards to receive an aide's assistance in the home. Patient verbalized understanding and stated she will contact her insurance company to fax the necessary forms.

## 2019-04-09 NOTE — Telephone Encounter (Signed)
Let Mary Valencia's SW to send me he paperwork pls Thx

## 2019-04-10 DIAGNOSIS — N186 End stage renal disease: Secondary | ICD-10-CM | POA: Diagnosis not present

## 2019-04-11 DIAGNOSIS — N186 End stage renal disease: Secondary | ICD-10-CM | POA: Diagnosis not present

## 2019-04-11 DIAGNOSIS — Z4932 Encounter for adequacy testing for peritoneal dialysis: Secondary | ICD-10-CM | POA: Diagnosis not present

## 2019-04-12 DIAGNOSIS — N186 End stage renal disease: Secondary | ICD-10-CM | POA: Diagnosis not present

## 2019-04-13 DIAGNOSIS — N186 End stage renal disease: Secondary | ICD-10-CM | POA: Diagnosis not present

## 2019-04-14 DIAGNOSIS — N186 End stage renal disease: Secondary | ICD-10-CM | POA: Diagnosis not present

## 2019-04-15 DIAGNOSIS — N186 End stage renal disease: Secondary | ICD-10-CM | POA: Diagnosis not present

## 2019-04-16 DIAGNOSIS — Z23 Encounter for immunization: Secondary | ICD-10-CM | POA: Diagnosis not present

## 2019-04-16 DIAGNOSIS — N186 End stage renal disease: Secondary | ICD-10-CM | POA: Diagnosis not present

## 2019-04-16 DIAGNOSIS — N2581 Secondary hyperparathyroidism of renal origin: Secondary | ICD-10-CM | POA: Diagnosis not present

## 2019-04-17 DIAGNOSIS — Z23 Encounter for immunization: Secondary | ICD-10-CM | POA: Diagnosis not present

## 2019-04-17 DIAGNOSIS — N186 End stage renal disease: Secondary | ICD-10-CM | POA: Diagnosis not present

## 2019-04-17 DIAGNOSIS — N2581 Secondary hyperparathyroidism of renal origin: Secondary | ICD-10-CM | POA: Diagnosis not present

## 2019-04-18 DIAGNOSIS — Z23 Encounter for immunization: Secondary | ICD-10-CM | POA: Diagnosis not present

## 2019-04-18 DIAGNOSIS — N2581 Secondary hyperparathyroidism of renal origin: Secondary | ICD-10-CM | POA: Diagnosis not present

## 2019-04-18 DIAGNOSIS — N186 End stage renal disease: Secondary | ICD-10-CM | POA: Diagnosis not present

## 2019-04-19 DIAGNOSIS — N186 End stage renal disease: Secondary | ICD-10-CM | POA: Diagnosis not present

## 2019-04-19 DIAGNOSIS — N2581 Secondary hyperparathyroidism of renal origin: Secondary | ICD-10-CM | POA: Diagnosis not present

## 2019-04-19 DIAGNOSIS — Z23 Encounter for immunization: Secondary | ICD-10-CM | POA: Diagnosis not present

## 2019-04-20 DIAGNOSIS — N2581 Secondary hyperparathyroidism of renal origin: Secondary | ICD-10-CM | POA: Diagnosis not present

## 2019-04-20 DIAGNOSIS — N186 End stage renal disease: Secondary | ICD-10-CM | POA: Diagnosis not present

## 2019-04-20 DIAGNOSIS — Z23 Encounter for immunization: Secondary | ICD-10-CM | POA: Diagnosis not present

## 2019-04-21 DIAGNOSIS — Z23 Encounter for immunization: Secondary | ICD-10-CM | POA: Diagnosis not present

## 2019-04-21 DIAGNOSIS — N186 End stage renal disease: Secondary | ICD-10-CM | POA: Diagnosis not present

## 2019-04-21 DIAGNOSIS — N2581 Secondary hyperparathyroidism of renal origin: Secondary | ICD-10-CM | POA: Diagnosis not present

## 2019-04-22 DIAGNOSIS — N2581 Secondary hyperparathyroidism of renal origin: Secondary | ICD-10-CM | POA: Diagnosis not present

## 2019-04-22 DIAGNOSIS — N186 End stage renal disease: Secondary | ICD-10-CM | POA: Diagnosis not present

## 2019-04-22 DIAGNOSIS — Z23 Encounter for immunization: Secondary | ICD-10-CM | POA: Diagnosis not present

## 2019-04-23 DIAGNOSIS — Z23 Encounter for immunization: Secondary | ICD-10-CM | POA: Diagnosis not present

## 2019-04-23 DIAGNOSIS — N2581 Secondary hyperparathyroidism of renal origin: Secondary | ICD-10-CM | POA: Diagnosis not present

## 2019-04-23 DIAGNOSIS — N186 End stage renal disease: Secondary | ICD-10-CM | POA: Diagnosis not present

## 2019-04-24 DIAGNOSIS — N186 End stage renal disease: Secondary | ICD-10-CM | POA: Diagnosis not present

## 2019-04-24 DIAGNOSIS — N2581 Secondary hyperparathyroidism of renal origin: Secondary | ICD-10-CM | POA: Diagnosis not present

## 2019-04-24 DIAGNOSIS — Z23 Encounter for immunization: Secondary | ICD-10-CM | POA: Diagnosis not present

## 2019-04-24 DIAGNOSIS — Z4932 Encounter for adequacy testing for peritoneal dialysis: Secondary | ICD-10-CM | POA: Diagnosis not present

## 2019-04-25 DIAGNOSIS — N2581 Secondary hyperparathyroidism of renal origin: Secondary | ICD-10-CM | POA: Diagnosis not present

## 2019-04-25 DIAGNOSIS — Z23 Encounter for immunization: Secondary | ICD-10-CM | POA: Diagnosis not present

## 2019-04-25 DIAGNOSIS — N186 End stage renal disease: Secondary | ICD-10-CM | POA: Diagnosis not present

## 2019-04-26 DIAGNOSIS — Z23 Encounter for immunization: Secondary | ICD-10-CM | POA: Diagnosis not present

## 2019-04-26 DIAGNOSIS — N186 End stage renal disease: Secondary | ICD-10-CM | POA: Diagnosis not present

## 2019-04-26 DIAGNOSIS — Z992 Dependence on renal dialysis: Secondary | ICD-10-CM | POA: Diagnosis not present

## 2019-04-26 DIAGNOSIS — N2581 Secondary hyperparathyroidism of renal origin: Secondary | ICD-10-CM | POA: Diagnosis not present

## 2019-04-27 DIAGNOSIS — N2581 Secondary hyperparathyroidism of renal origin: Secondary | ICD-10-CM | POA: Diagnosis not present

## 2019-04-27 DIAGNOSIS — D509 Iron deficiency anemia, unspecified: Secondary | ICD-10-CM | POA: Diagnosis not present

## 2019-04-27 DIAGNOSIS — N186 End stage renal disease: Secondary | ICD-10-CM | POA: Diagnosis not present

## 2019-04-28 ENCOUNTER — Other Ambulatory Visit (HOSPITAL_BASED_OUTPATIENT_CLINIC_OR_DEPARTMENT_OTHER): Payer: Self-pay | Admitting: Internal Medicine

## 2019-04-28 DIAGNOSIS — Z1231 Encounter for screening mammogram for malignant neoplasm of breast: Secondary | ICD-10-CM

## 2019-04-28 DIAGNOSIS — N186 End stage renal disease: Secondary | ICD-10-CM | POA: Diagnosis not present

## 2019-04-28 DIAGNOSIS — D509 Iron deficiency anemia, unspecified: Secondary | ICD-10-CM | POA: Diagnosis not present

## 2019-04-28 DIAGNOSIS — N2581 Secondary hyperparathyroidism of renal origin: Secondary | ICD-10-CM | POA: Diagnosis not present

## 2019-04-28 DIAGNOSIS — E119 Type 2 diabetes mellitus without complications: Secondary | ICD-10-CM | POA: Diagnosis not present

## 2019-04-29 DIAGNOSIS — D509 Iron deficiency anemia, unspecified: Secondary | ICD-10-CM | POA: Diagnosis not present

## 2019-04-29 DIAGNOSIS — N2581 Secondary hyperparathyroidism of renal origin: Secondary | ICD-10-CM | POA: Diagnosis not present

## 2019-04-29 DIAGNOSIS — N186 End stage renal disease: Secondary | ICD-10-CM | POA: Diagnosis not present

## 2019-04-30 DIAGNOSIS — D509 Iron deficiency anemia, unspecified: Secondary | ICD-10-CM | POA: Diagnosis not present

## 2019-04-30 DIAGNOSIS — N186 End stage renal disease: Secondary | ICD-10-CM | POA: Diagnosis not present

## 2019-04-30 DIAGNOSIS — N2581 Secondary hyperparathyroidism of renal origin: Secondary | ICD-10-CM | POA: Diagnosis not present

## 2019-05-01 ENCOUNTER — Ambulatory Visit (HOSPITAL_BASED_OUTPATIENT_CLINIC_OR_DEPARTMENT_OTHER): Payer: Medicare HMO

## 2019-05-01 DIAGNOSIS — N186 End stage renal disease: Secondary | ICD-10-CM | POA: Diagnosis not present

## 2019-05-01 DIAGNOSIS — D509 Iron deficiency anemia, unspecified: Secondary | ICD-10-CM | POA: Diagnosis not present

## 2019-05-01 DIAGNOSIS — N2581 Secondary hyperparathyroidism of renal origin: Secondary | ICD-10-CM | POA: Diagnosis not present

## 2019-05-02 DIAGNOSIS — N186 End stage renal disease: Secondary | ICD-10-CM | POA: Diagnosis not present

## 2019-05-02 DIAGNOSIS — D509 Iron deficiency anemia, unspecified: Secondary | ICD-10-CM | POA: Diagnosis not present

## 2019-05-02 DIAGNOSIS — N2581 Secondary hyperparathyroidism of renal origin: Secondary | ICD-10-CM | POA: Diagnosis not present

## 2019-05-03 DIAGNOSIS — D509 Iron deficiency anemia, unspecified: Secondary | ICD-10-CM | POA: Diagnosis not present

## 2019-05-03 DIAGNOSIS — N186 End stage renal disease: Secondary | ICD-10-CM | POA: Diagnosis not present

## 2019-05-03 DIAGNOSIS — N2581 Secondary hyperparathyroidism of renal origin: Secondary | ICD-10-CM | POA: Diagnosis not present

## 2019-05-04 DIAGNOSIS — D509 Iron deficiency anemia, unspecified: Secondary | ICD-10-CM | POA: Diagnosis not present

## 2019-05-04 DIAGNOSIS — N2581 Secondary hyperparathyroidism of renal origin: Secondary | ICD-10-CM | POA: Diagnosis not present

## 2019-05-04 DIAGNOSIS — N186 End stage renal disease: Secondary | ICD-10-CM | POA: Diagnosis not present

## 2019-05-05 DIAGNOSIS — N186 End stage renal disease: Secondary | ICD-10-CM | POA: Diagnosis not present

## 2019-05-05 DIAGNOSIS — D509 Iron deficiency anemia, unspecified: Secondary | ICD-10-CM | POA: Diagnosis not present

## 2019-05-05 DIAGNOSIS — N2581 Secondary hyperparathyroidism of renal origin: Secondary | ICD-10-CM | POA: Diagnosis not present

## 2019-05-06 DIAGNOSIS — D509 Iron deficiency anemia, unspecified: Secondary | ICD-10-CM | POA: Diagnosis not present

## 2019-05-06 DIAGNOSIS — N2581 Secondary hyperparathyroidism of renal origin: Secondary | ICD-10-CM | POA: Diagnosis not present

## 2019-05-06 DIAGNOSIS — N186 End stage renal disease: Secondary | ICD-10-CM | POA: Diagnosis not present

## 2019-05-07 DIAGNOSIS — N186 End stage renal disease: Secondary | ICD-10-CM | POA: Diagnosis not present

## 2019-05-08 DIAGNOSIS — N186 End stage renal disease: Secondary | ICD-10-CM | POA: Diagnosis not present

## 2019-05-09 DIAGNOSIS — N186 End stage renal disease: Secondary | ICD-10-CM | POA: Diagnosis not present

## 2019-05-10 DIAGNOSIS — N186 End stage renal disease: Secondary | ICD-10-CM | POA: Diagnosis not present

## 2019-05-11 DIAGNOSIS — N186 End stage renal disease: Secondary | ICD-10-CM | POA: Diagnosis not present

## 2019-05-12 DIAGNOSIS — N186 End stage renal disease: Secondary | ICD-10-CM | POA: Diagnosis not present

## 2019-05-13 DIAGNOSIS — N186 End stage renal disease: Secondary | ICD-10-CM | POA: Diagnosis not present

## 2019-05-14 DIAGNOSIS — N186 End stage renal disease: Secondary | ICD-10-CM | POA: Diagnosis not present

## 2019-05-15 DIAGNOSIS — N186 End stage renal disease: Secondary | ICD-10-CM | POA: Diagnosis not present

## 2019-05-16 DIAGNOSIS — N186 End stage renal disease: Secondary | ICD-10-CM | POA: Diagnosis not present

## 2019-05-17 DIAGNOSIS — N186 End stage renal disease: Secondary | ICD-10-CM | POA: Diagnosis not present

## 2019-05-18 DIAGNOSIS — N186 End stage renal disease: Secondary | ICD-10-CM | POA: Diagnosis not present

## 2019-05-19 DIAGNOSIS — N186 End stage renal disease: Secondary | ICD-10-CM | POA: Diagnosis not present

## 2019-05-20 DIAGNOSIS — N186 End stage renal disease: Secondary | ICD-10-CM | POA: Diagnosis not present

## 2019-05-21 DIAGNOSIS — N186 End stage renal disease: Secondary | ICD-10-CM | POA: Diagnosis not present

## 2019-05-22 DIAGNOSIS — N186 End stage renal disease: Secondary | ICD-10-CM | POA: Diagnosis not present

## 2019-05-23 DIAGNOSIS — N186 End stage renal disease: Secondary | ICD-10-CM | POA: Diagnosis not present

## 2019-05-24 DIAGNOSIS — N186 End stage renal disease: Secondary | ICD-10-CM | POA: Diagnosis not present

## 2019-05-25 DIAGNOSIS — N186 End stage renal disease: Secondary | ICD-10-CM | POA: Diagnosis not present

## 2019-05-26 DIAGNOSIS — N186 End stage renal disease: Secondary | ICD-10-CM | POA: Diagnosis not present

## 2019-05-27 DIAGNOSIS — N2581 Secondary hyperparathyroidism of renal origin: Secondary | ICD-10-CM | POA: Diagnosis not present

## 2019-05-27 DIAGNOSIS — N186 End stage renal disease: Secondary | ICD-10-CM | POA: Diagnosis not present

## 2019-05-27 DIAGNOSIS — D509 Iron deficiency anemia, unspecified: Secondary | ICD-10-CM | POA: Diagnosis not present

## 2019-05-27 DIAGNOSIS — D631 Anemia in chronic kidney disease: Secondary | ICD-10-CM | POA: Diagnosis not present

## 2019-05-28 DIAGNOSIS — N2581 Secondary hyperparathyroidism of renal origin: Secondary | ICD-10-CM | POA: Diagnosis not present

## 2019-05-28 DIAGNOSIS — N186 End stage renal disease: Secondary | ICD-10-CM | POA: Diagnosis not present

## 2019-05-28 DIAGNOSIS — D631 Anemia in chronic kidney disease: Secondary | ICD-10-CM | POA: Diagnosis not present

## 2019-05-28 DIAGNOSIS — D509 Iron deficiency anemia, unspecified: Secondary | ICD-10-CM | POA: Diagnosis not present

## 2019-05-29 DIAGNOSIS — D631 Anemia in chronic kidney disease: Secondary | ICD-10-CM | POA: Diagnosis not present

## 2019-05-29 DIAGNOSIS — N186 End stage renal disease: Secondary | ICD-10-CM | POA: Diagnosis not present

## 2019-05-29 DIAGNOSIS — D509 Iron deficiency anemia, unspecified: Secondary | ICD-10-CM | POA: Diagnosis not present

## 2019-05-29 DIAGNOSIS — N2581 Secondary hyperparathyroidism of renal origin: Secondary | ICD-10-CM | POA: Diagnosis not present

## 2019-05-30 DIAGNOSIS — E119 Type 2 diabetes mellitus without complications: Secondary | ICD-10-CM | POA: Diagnosis not present

## 2019-05-30 DIAGNOSIS — D509 Iron deficiency anemia, unspecified: Secondary | ICD-10-CM | POA: Diagnosis not present

## 2019-05-30 DIAGNOSIS — N186 End stage renal disease: Secondary | ICD-10-CM | POA: Diagnosis not present

## 2019-05-30 DIAGNOSIS — N2581 Secondary hyperparathyroidism of renal origin: Secondary | ICD-10-CM | POA: Diagnosis not present

## 2019-05-30 DIAGNOSIS — D631 Anemia in chronic kidney disease: Secondary | ICD-10-CM | POA: Diagnosis not present

## 2019-05-31 DIAGNOSIS — D631 Anemia in chronic kidney disease: Secondary | ICD-10-CM | POA: Diagnosis not present

## 2019-05-31 DIAGNOSIS — N2581 Secondary hyperparathyroidism of renal origin: Secondary | ICD-10-CM | POA: Diagnosis not present

## 2019-05-31 DIAGNOSIS — D509 Iron deficiency anemia, unspecified: Secondary | ICD-10-CM | POA: Diagnosis not present

## 2019-05-31 DIAGNOSIS — N186 End stage renal disease: Secondary | ICD-10-CM | POA: Diagnosis not present

## 2019-06-01 DIAGNOSIS — N2581 Secondary hyperparathyroidism of renal origin: Secondary | ICD-10-CM | POA: Diagnosis not present

## 2019-06-01 DIAGNOSIS — D509 Iron deficiency anemia, unspecified: Secondary | ICD-10-CM | POA: Diagnosis not present

## 2019-06-01 DIAGNOSIS — D631 Anemia in chronic kidney disease: Secondary | ICD-10-CM | POA: Diagnosis not present

## 2019-06-01 DIAGNOSIS — N186 End stage renal disease: Secondary | ICD-10-CM | POA: Diagnosis not present

## 2019-06-02 DIAGNOSIS — D631 Anemia in chronic kidney disease: Secondary | ICD-10-CM | POA: Diagnosis not present

## 2019-06-02 DIAGNOSIS — N2581 Secondary hyperparathyroidism of renal origin: Secondary | ICD-10-CM | POA: Diagnosis not present

## 2019-06-02 DIAGNOSIS — N186 End stage renal disease: Secondary | ICD-10-CM | POA: Diagnosis not present

## 2019-06-02 DIAGNOSIS — D509 Iron deficiency anemia, unspecified: Secondary | ICD-10-CM | POA: Diagnosis not present

## 2019-06-03 DIAGNOSIS — D509 Iron deficiency anemia, unspecified: Secondary | ICD-10-CM | POA: Diagnosis not present

## 2019-06-03 DIAGNOSIS — D631 Anemia in chronic kidney disease: Secondary | ICD-10-CM | POA: Diagnosis not present

## 2019-06-03 DIAGNOSIS — N186 End stage renal disease: Secondary | ICD-10-CM | POA: Diagnosis not present

## 2019-06-03 DIAGNOSIS — N2581 Secondary hyperparathyroidism of renal origin: Secondary | ICD-10-CM | POA: Diagnosis not present

## 2019-06-04 DIAGNOSIS — N186 End stage renal disease: Secondary | ICD-10-CM | POA: Diagnosis not present

## 2019-06-04 DIAGNOSIS — D509 Iron deficiency anemia, unspecified: Secondary | ICD-10-CM | POA: Diagnosis not present

## 2019-06-04 DIAGNOSIS — D631 Anemia in chronic kidney disease: Secondary | ICD-10-CM | POA: Diagnosis not present

## 2019-06-04 DIAGNOSIS — N2581 Secondary hyperparathyroidism of renal origin: Secondary | ICD-10-CM | POA: Diagnosis not present

## 2019-06-05 DIAGNOSIS — D509 Iron deficiency anemia, unspecified: Secondary | ICD-10-CM | POA: Diagnosis not present

## 2019-06-05 DIAGNOSIS — D631 Anemia in chronic kidney disease: Secondary | ICD-10-CM | POA: Diagnosis not present

## 2019-06-05 DIAGNOSIS — N186 End stage renal disease: Secondary | ICD-10-CM | POA: Diagnosis not present

## 2019-06-05 DIAGNOSIS — N2581 Secondary hyperparathyroidism of renal origin: Secondary | ICD-10-CM | POA: Diagnosis not present

## 2019-06-06 DIAGNOSIS — N2581 Secondary hyperparathyroidism of renal origin: Secondary | ICD-10-CM | POA: Diagnosis not present

## 2019-06-06 DIAGNOSIS — N186 End stage renal disease: Secondary | ICD-10-CM | POA: Diagnosis not present

## 2019-06-07 DIAGNOSIS — N186 End stage renal disease: Secondary | ICD-10-CM | POA: Diagnosis not present

## 2019-06-07 DIAGNOSIS — N2581 Secondary hyperparathyroidism of renal origin: Secondary | ICD-10-CM | POA: Diagnosis not present

## 2019-06-08 DIAGNOSIS — N186 End stage renal disease: Secondary | ICD-10-CM | POA: Diagnosis not present

## 2019-06-08 DIAGNOSIS — N2581 Secondary hyperparathyroidism of renal origin: Secondary | ICD-10-CM | POA: Diagnosis not present

## 2019-06-09 DIAGNOSIS — N186 End stage renal disease: Secondary | ICD-10-CM | POA: Diagnosis not present

## 2019-06-09 DIAGNOSIS — N2581 Secondary hyperparathyroidism of renal origin: Secondary | ICD-10-CM | POA: Diagnosis not present

## 2019-06-10 DIAGNOSIS — N2581 Secondary hyperparathyroidism of renal origin: Secondary | ICD-10-CM | POA: Diagnosis not present

## 2019-06-10 DIAGNOSIS — N186 End stage renal disease: Secondary | ICD-10-CM | POA: Diagnosis not present

## 2019-06-11 DIAGNOSIS — N186 End stage renal disease: Secondary | ICD-10-CM | POA: Diagnosis not present

## 2019-06-11 DIAGNOSIS — N2581 Secondary hyperparathyroidism of renal origin: Secondary | ICD-10-CM | POA: Diagnosis not present

## 2019-06-12 DIAGNOSIS — N2581 Secondary hyperparathyroidism of renal origin: Secondary | ICD-10-CM | POA: Diagnosis not present

## 2019-06-12 DIAGNOSIS — N186 End stage renal disease: Secondary | ICD-10-CM | POA: Diagnosis not present

## 2019-06-13 DIAGNOSIS — N186 End stage renal disease: Secondary | ICD-10-CM | POA: Diagnosis not present

## 2019-06-13 DIAGNOSIS — N2581 Secondary hyperparathyroidism of renal origin: Secondary | ICD-10-CM | POA: Diagnosis not present

## 2019-06-14 DIAGNOSIS — N186 End stage renal disease: Secondary | ICD-10-CM | POA: Diagnosis not present

## 2019-06-14 DIAGNOSIS — N2581 Secondary hyperparathyroidism of renal origin: Secondary | ICD-10-CM | POA: Diagnosis not present

## 2019-06-15 DIAGNOSIS — N186 End stage renal disease: Secondary | ICD-10-CM | POA: Diagnosis not present

## 2019-06-15 DIAGNOSIS — N2581 Secondary hyperparathyroidism of renal origin: Secondary | ICD-10-CM | POA: Diagnosis not present

## 2019-06-16 DIAGNOSIS — N186 End stage renal disease: Secondary | ICD-10-CM | POA: Diagnosis not present

## 2019-06-17 DIAGNOSIS — N186 End stage renal disease: Secondary | ICD-10-CM | POA: Diagnosis not present

## 2019-06-18 DIAGNOSIS — N186 End stage renal disease: Secondary | ICD-10-CM | POA: Diagnosis not present

## 2019-06-19 DIAGNOSIS — N186 End stage renal disease: Secondary | ICD-10-CM | POA: Diagnosis not present

## 2019-06-20 DIAGNOSIS — N186 End stage renal disease: Secondary | ICD-10-CM | POA: Diagnosis not present

## 2019-06-21 DIAGNOSIS — N186 End stage renal disease: Secondary | ICD-10-CM | POA: Diagnosis not present

## 2019-06-22 DIAGNOSIS — N186 End stage renal disease: Secondary | ICD-10-CM | POA: Diagnosis not present

## 2019-06-23 DIAGNOSIS — N186 End stage renal disease: Secondary | ICD-10-CM | POA: Diagnosis not present

## 2019-06-24 DIAGNOSIS — N186 End stage renal disease: Secondary | ICD-10-CM | POA: Diagnosis not present

## 2019-06-25 DIAGNOSIS — N186 End stage renal disease: Secondary | ICD-10-CM | POA: Diagnosis not present

## 2019-06-26 DIAGNOSIS — N186 End stage renal disease: Secondary | ICD-10-CM | POA: Diagnosis not present

## 2019-06-26 DIAGNOSIS — Z992 Dependence on renal dialysis: Secondary | ICD-10-CM | POA: Diagnosis not present

## 2019-06-27 DIAGNOSIS — N186 End stage renal disease: Secondary | ICD-10-CM | POA: Diagnosis not present

## 2019-06-27 DIAGNOSIS — D509 Iron deficiency anemia, unspecified: Secondary | ICD-10-CM | POA: Diagnosis not present

## 2019-06-27 DIAGNOSIS — D631 Anemia in chronic kidney disease: Secondary | ICD-10-CM | POA: Diagnosis not present

## 2019-06-27 DIAGNOSIS — N2581 Secondary hyperparathyroidism of renal origin: Secondary | ICD-10-CM | POA: Diagnosis not present

## 2019-06-28 DIAGNOSIS — D631 Anemia in chronic kidney disease: Secondary | ICD-10-CM | POA: Diagnosis not present

## 2019-06-28 DIAGNOSIS — D509 Iron deficiency anemia, unspecified: Secondary | ICD-10-CM | POA: Diagnosis not present

## 2019-06-28 DIAGNOSIS — N186 End stage renal disease: Secondary | ICD-10-CM | POA: Diagnosis not present

## 2019-06-28 DIAGNOSIS — N2581 Secondary hyperparathyroidism of renal origin: Secondary | ICD-10-CM | POA: Diagnosis not present

## 2019-06-29 DIAGNOSIS — D631 Anemia in chronic kidney disease: Secondary | ICD-10-CM | POA: Diagnosis not present

## 2019-06-29 DIAGNOSIS — D509 Iron deficiency anemia, unspecified: Secondary | ICD-10-CM | POA: Diagnosis not present

## 2019-06-29 DIAGNOSIS — N2581 Secondary hyperparathyroidism of renal origin: Secondary | ICD-10-CM | POA: Diagnosis not present

## 2019-06-29 DIAGNOSIS — N186 End stage renal disease: Secondary | ICD-10-CM | POA: Diagnosis not present

## 2019-06-30 DIAGNOSIS — N2581 Secondary hyperparathyroidism of renal origin: Secondary | ICD-10-CM | POA: Diagnosis not present

## 2019-06-30 DIAGNOSIS — N186 End stage renal disease: Secondary | ICD-10-CM | POA: Diagnosis not present

## 2019-06-30 DIAGNOSIS — D631 Anemia in chronic kidney disease: Secondary | ICD-10-CM | POA: Diagnosis not present

## 2019-06-30 DIAGNOSIS — D509 Iron deficiency anemia, unspecified: Secondary | ICD-10-CM | POA: Diagnosis not present

## 2019-07-01 DIAGNOSIS — D631 Anemia in chronic kidney disease: Secondary | ICD-10-CM | POA: Diagnosis not present

## 2019-07-01 DIAGNOSIS — N186 End stage renal disease: Secondary | ICD-10-CM | POA: Diagnosis not present

## 2019-07-01 DIAGNOSIS — N2581 Secondary hyperparathyroidism of renal origin: Secondary | ICD-10-CM | POA: Diagnosis not present

## 2019-07-01 DIAGNOSIS — D509 Iron deficiency anemia, unspecified: Secondary | ICD-10-CM | POA: Diagnosis not present

## 2019-07-01 DIAGNOSIS — Z4932 Encounter for adequacy testing for peritoneal dialysis: Secondary | ICD-10-CM | POA: Diagnosis not present

## 2019-07-01 DIAGNOSIS — E119 Type 2 diabetes mellitus without complications: Secondary | ICD-10-CM | POA: Diagnosis not present

## 2019-07-02 DIAGNOSIS — D631 Anemia in chronic kidney disease: Secondary | ICD-10-CM | POA: Diagnosis not present

## 2019-07-02 DIAGNOSIS — N2581 Secondary hyperparathyroidism of renal origin: Secondary | ICD-10-CM | POA: Diagnosis not present

## 2019-07-02 DIAGNOSIS — D509 Iron deficiency anemia, unspecified: Secondary | ICD-10-CM | POA: Diagnosis not present

## 2019-07-02 DIAGNOSIS — N186 End stage renal disease: Secondary | ICD-10-CM | POA: Diagnosis not present

## 2019-07-03 DIAGNOSIS — N186 End stage renal disease: Secondary | ICD-10-CM | POA: Diagnosis not present

## 2019-07-03 DIAGNOSIS — N2581 Secondary hyperparathyroidism of renal origin: Secondary | ICD-10-CM | POA: Diagnosis not present

## 2019-07-03 DIAGNOSIS — D631 Anemia in chronic kidney disease: Secondary | ICD-10-CM | POA: Diagnosis not present

## 2019-07-03 DIAGNOSIS — D509 Iron deficiency anemia, unspecified: Secondary | ICD-10-CM | POA: Diagnosis not present

## 2019-07-04 DIAGNOSIS — D631 Anemia in chronic kidney disease: Secondary | ICD-10-CM | POA: Diagnosis not present

## 2019-07-04 DIAGNOSIS — N2581 Secondary hyperparathyroidism of renal origin: Secondary | ICD-10-CM | POA: Diagnosis not present

## 2019-07-04 DIAGNOSIS — D509 Iron deficiency anemia, unspecified: Secondary | ICD-10-CM | POA: Diagnosis not present

## 2019-07-04 DIAGNOSIS — N186 End stage renal disease: Secondary | ICD-10-CM | POA: Diagnosis not present

## 2019-07-05 DIAGNOSIS — D631 Anemia in chronic kidney disease: Secondary | ICD-10-CM | POA: Diagnosis not present

## 2019-07-05 DIAGNOSIS — N2581 Secondary hyperparathyroidism of renal origin: Secondary | ICD-10-CM | POA: Diagnosis not present

## 2019-07-05 DIAGNOSIS — D509 Iron deficiency anemia, unspecified: Secondary | ICD-10-CM | POA: Diagnosis not present

## 2019-07-05 DIAGNOSIS — N186 End stage renal disease: Secondary | ICD-10-CM | POA: Diagnosis not present

## 2019-07-06 DIAGNOSIS — N2581 Secondary hyperparathyroidism of renal origin: Secondary | ICD-10-CM | POA: Diagnosis not present

## 2019-07-06 DIAGNOSIS — N186 End stage renal disease: Secondary | ICD-10-CM | POA: Diagnosis not present

## 2019-07-06 DIAGNOSIS — D631 Anemia in chronic kidney disease: Secondary | ICD-10-CM | POA: Diagnosis not present

## 2019-07-06 DIAGNOSIS — D509 Iron deficiency anemia, unspecified: Secondary | ICD-10-CM | POA: Diagnosis not present

## 2019-07-07 DIAGNOSIS — N186 End stage renal disease: Secondary | ICD-10-CM | POA: Diagnosis not present

## 2019-07-08 DIAGNOSIS — N186 End stage renal disease: Secondary | ICD-10-CM | POA: Diagnosis not present

## 2019-07-09 DIAGNOSIS — N186 End stage renal disease: Secondary | ICD-10-CM | POA: Diagnosis not present

## 2019-07-10 DIAGNOSIS — N186 End stage renal disease: Secondary | ICD-10-CM | POA: Diagnosis not present

## 2019-07-11 DIAGNOSIS — N186 End stage renal disease: Secondary | ICD-10-CM | POA: Diagnosis not present

## 2019-07-12 DIAGNOSIS — N186 End stage renal disease: Secondary | ICD-10-CM | POA: Diagnosis not present

## 2019-07-13 DIAGNOSIS — N186 End stage renal disease: Secondary | ICD-10-CM | POA: Diagnosis not present

## 2019-07-14 DIAGNOSIS — N186 End stage renal disease: Secondary | ICD-10-CM | POA: Diagnosis not present

## 2019-07-15 DIAGNOSIS — N186 End stage renal disease: Secondary | ICD-10-CM | POA: Diagnosis not present

## 2019-07-16 DIAGNOSIS — N186 End stage renal disease: Secondary | ICD-10-CM | POA: Diagnosis not present

## 2019-07-17 DIAGNOSIS — N186 End stage renal disease: Secondary | ICD-10-CM | POA: Diagnosis not present

## 2019-07-18 DIAGNOSIS — N186 End stage renal disease: Secondary | ICD-10-CM | POA: Diagnosis not present

## 2019-07-19 DIAGNOSIS — N186 End stage renal disease: Secondary | ICD-10-CM | POA: Diagnosis not present

## 2019-07-20 DIAGNOSIS — N186 End stage renal disease: Secondary | ICD-10-CM | POA: Diagnosis not present

## 2019-07-21 DIAGNOSIS — N186 End stage renal disease: Secondary | ICD-10-CM | POA: Diagnosis not present

## 2019-07-22 DIAGNOSIS — N186 End stage renal disease: Secondary | ICD-10-CM | POA: Diagnosis not present

## 2019-07-23 DIAGNOSIS — N186 End stage renal disease: Secondary | ICD-10-CM | POA: Diagnosis not present

## 2019-07-24 DIAGNOSIS — N186 End stage renal disease: Secondary | ICD-10-CM | POA: Diagnosis not present

## 2019-07-25 DIAGNOSIS — N186 End stage renal disease: Secondary | ICD-10-CM | POA: Diagnosis not present

## 2019-07-26 DIAGNOSIS — N186 End stage renal disease: Secondary | ICD-10-CM | POA: Diagnosis not present

## 2019-07-27 DIAGNOSIS — N186 End stage renal disease: Secondary | ICD-10-CM | POA: Diagnosis not present

## 2019-07-27 DIAGNOSIS — Z992 Dependence on renal dialysis: Secondary | ICD-10-CM | POA: Diagnosis not present

## 2019-07-28 DIAGNOSIS — N2581 Secondary hyperparathyroidism of renal origin: Secondary | ICD-10-CM | POA: Diagnosis not present

## 2019-07-28 DIAGNOSIS — D631 Anemia in chronic kidney disease: Secondary | ICD-10-CM | POA: Diagnosis not present

## 2019-07-28 DIAGNOSIS — N186 End stage renal disease: Secondary | ICD-10-CM | POA: Diagnosis not present

## 2019-07-29 DIAGNOSIS — D631 Anemia in chronic kidney disease: Secondary | ICD-10-CM | POA: Diagnosis not present

## 2019-07-29 DIAGNOSIS — N2581 Secondary hyperparathyroidism of renal origin: Secondary | ICD-10-CM | POA: Diagnosis not present

## 2019-07-29 DIAGNOSIS — N186 End stage renal disease: Secondary | ICD-10-CM | POA: Diagnosis not present

## 2019-07-30 DIAGNOSIS — N186 End stage renal disease: Secondary | ICD-10-CM | POA: Diagnosis not present

## 2019-07-30 DIAGNOSIS — N2581 Secondary hyperparathyroidism of renal origin: Secondary | ICD-10-CM | POA: Diagnosis not present

## 2019-07-30 DIAGNOSIS — D631 Anemia in chronic kidney disease: Secondary | ICD-10-CM | POA: Diagnosis not present

## 2019-07-31 DIAGNOSIS — E119 Type 2 diabetes mellitus without complications: Secondary | ICD-10-CM | POA: Diagnosis not present

## 2019-07-31 DIAGNOSIS — D631 Anemia in chronic kidney disease: Secondary | ICD-10-CM | POA: Diagnosis not present

## 2019-07-31 DIAGNOSIS — N2581 Secondary hyperparathyroidism of renal origin: Secondary | ICD-10-CM | POA: Diagnosis not present

## 2019-07-31 DIAGNOSIS — N186 End stage renal disease: Secondary | ICD-10-CM | POA: Diagnosis not present

## 2019-08-01 DIAGNOSIS — D631 Anemia in chronic kidney disease: Secondary | ICD-10-CM | POA: Diagnosis not present

## 2019-08-01 DIAGNOSIS — N186 End stage renal disease: Secondary | ICD-10-CM | POA: Diagnosis not present

## 2019-08-01 DIAGNOSIS — N2581 Secondary hyperparathyroidism of renal origin: Secondary | ICD-10-CM | POA: Diagnosis not present

## 2019-08-02 DIAGNOSIS — N186 End stage renal disease: Secondary | ICD-10-CM | POA: Diagnosis not present

## 2019-08-02 DIAGNOSIS — D631 Anemia in chronic kidney disease: Secondary | ICD-10-CM | POA: Diagnosis not present

## 2019-08-02 DIAGNOSIS — N2581 Secondary hyperparathyroidism of renal origin: Secondary | ICD-10-CM | POA: Diagnosis not present

## 2019-08-03 DIAGNOSIS — N2581 Secondary hyperparathyroidism of renal origin: Secondary | ICD-10-CM | POA: Diagnosis not present

## 2019-08-03 DIAGNOSIS — D631 Anemia in chronic kidney disease: Secondary | ICD-10-CM | POA: Diagnosis not present

## 2019-08-03 DIAGNOSIS — N186 End stage renal disease: Secondary | ICD-10-CM | POA: Diagnosis not present

## 2019-08-04 DIAGNOSIS — N2581 Secondary hyperparathyroidism of renal origin: Secondary | ICD-10-CM | POA: Diagnosis not present

## 2019-08-04 DIAGNOSIS — N186 End stage renal disease: Secondary | ICD-10-CM | POA: Diagnosis not present

## 2019-08-04 DIAGNOSIS — D631 Anemia in chronic kidney disease: Secondary | ICD-10-CM | POA: Diagnosis not present

## 2019-08-05 DIAGNOSIS — D631 Anemia in chronic kidney disease: Secondary | ICD-10-CM | POA: Diagnosis not present

## 2019-08-05 DIAGNOSIS — N2581 Secondary hyperparathyroidism of renal origin: Secondary | ICD-10-CM | POA: Diagnosis not present

## 2019-08-05 DIAGNOSIS — N186 End stage renal disease: Secondary | ICD-10-CM | POA: Diagnosis not present

## 2019-08-06 DIAGNOSIS — N186 End stage renal disease: Secondary | ICD-10-CM | POA: Diagnosis not present

## 2019-08-06 DIAGNOSIS — N2581 Secondary hyperparathyroidism of renal origin: Secondary | ICD-10-CM | POA: Diagnosis not present

## 2019-08-06 DIAGNOSIS — D631 Anemia in chronic kidney disease: Secondary | ICD-10-CM | POA: Diagnosis not present

## 2019-08-07 DIAGNOSIS — N2581 Secondary hyperparathyroidism of renal origin: Secondary | ICD-10-CM | POA: Diagnosis not present

## 2019-08-07 DIAGNOSIS — N186 End stage renal disease: Secondary | ICD-10-CM | POA: Diagnosis not present

## 2019-08-08 DIAGNOSIS — N186 End stage renal disease: Secondary | ICD-10-CM | POA: Diagnosis not present

## 2019-08-08 DIAGNOSIS — N2581 Secondary hyperparathyroidism of renal origin: Secondary | ICD-10-CM | POA: Diagnosis not present

## 2019-08-09 DIAGNOSIS — N186 End stage renal disease: Secondary | ICD-10-CM | POA: Diagnosis not present

## 2019-08-09 DIAGNOSIS — N2581 Secondary hyperparathyroidism of renal origin: Secondary | ICD-10-CM | POA: Diagnosis not present

## 2019-08-10 DIAGNOSIS — N186 End stage renal disease: Secondary | ICD-10-CM | POA: Diagnosis not present

## 2019-08-10 DIAGNOSIS — N2581 Secondary hyperparathyroidism of renal origin: Secondary | ICD-10-CM | POA: Diagnosis not present

## 2019-08-11 DIAGNOSIS — N2581 Secondary hyperparathyroidism of renal origin: Secondary | ICD-10-CM | POA: Diagnosis not present

## 2019-08-11 DIAGNOSIS — N186 End stage renal disease: Secondary | ICD-10-CM | POA: Diagnosis not present

## 2019-08-12 DIAGNOSIS — N2581 Secondary hyperparathyroidism of renal origin: Secondary | ICD-10-CM | POA: Diagnosis not present

## 2019-08-12 DIAGNOSIS — N186 End stage renal disease: Secondary | ICD-10-CM | POA: Diagnosis not present

## 2019-08-13 DIAGNOSIS — N2581 Secondary hyperparathyroidism of renal origin: Secondary | ICD-10-CM | POA: Diagnosis not present

## 2019-08-13 DIAGNOSIS — N186 End stage renal disease: Secondary | ICD-10-CM | POA: Diagnosis not present

## 2019-08-14 DIAGNOSIS — N186 End stage renal disease: Secondary | ICD-10-CM | POA: Diagnosis not present

## 2019-08-14 DIAGNOSIS — N2581 Secondary hyperparathyroidism of renal origin: Secondary | ICD-10-CM | POA: Diagnosis not present

## 2019-08-15 DIAGNOSIS — N2581 Secondary hyperparathyroidism of renal origin: Secondary | ICD-10-CM | POA: Diagnosis not present

## 2019-08-15 DIAGNOSIS — N186 End stage renal disease: Secondary | ICD-10-CM | POA: Diagnosis not present

## 2019-08-16 DIAGNOSIS — N186 End stage renal disease: Secondary | ICD-10-CM | POA: Diagnosis not present

## 2019-08-16 DIAGNOSIS — N2581 Secondary hyperparathyroidism of renal origin: Secondary | ICD-10-CM | POA: Diagnosis not present

## 2019-08-17 DIAGNOSIS — N186 End stage renal disease: Secondary | ICD-10-CM | POA: Diagnosis not present

## 2019-08-18 DIAGNOSIS — N186 End stage renal disease: Secondary | ICD-10-CM | POA: Diagnosis not present

## 2019-08-19 DIAGNOSIS — N186 End stage renal disease: Secondary | ICD-10-CM | POA: Diagnosis not present

## 2019-08-19 DIAGNOSIS — Z992 Dependence on renal dialysis: Secondary | ICD-10-CM | POA: Diagnosis not present

## 2019-08-20 DIAGNOSIS — N186 End stage renal disease: Secondary | ICD-10-CM | POA: Diagnosis not present

## 2019-08-20 DIAGNOSIS — E876 Hypokalemia: Secondary | ICD-10-CM | POA: Diagnosis not present

## 2019-08-20 DIAGNOSIS — R001 Bradycardia, unspecified: Secondary | ICD-10-CM | POA: Diagnosis not present

## 2019-08-20 DIAGNOSIS — E1122 Type 2 diabetes mellitus with diabetic chronic kidney disease: Secondary | ICD-10-CM | POA: Diagnosis not present

## 2019-08-20 DIAGNOSIS — R748 Abnormal levels of other serum enzymes: Secondary | ICD-10-CM | POA: Diagnosis not present

## 2019-08-20 DIAGNOSIS — I120 Hypertensive chronic kidney disease with stage 5 chronic kidney disease or end stage renal disease: Secondary | ICD-10-CM | POA: Diagnosis not present

## 2019-08-20 DIAGNOSIS — Z7409 Other reduced mobility: Secondary | ICD-10-CM | POA: Diagnosis not present

## 2019-08-20 DIAGNOSIS — E1151 Type 2 diabetes mellitus with diabetic peripheral angiopathy without gangrene: Secondary | ICD-10-CM | POA: Diagnosis not present

## 2019-08-20 DIAGNOSIS — Z951 Presence of aortocoronary bypass graft: Secondary | ICD-10-CM | POA: Diagnosis not present

## 2019-08-20 DIAGNOSIS — I771 Stricture of artery: Secondary | ICD-10-CM | POA: Diagnosis not present

## 2019-08-20 DIAGNOSIS — I63511 Cerebral infarction due to unspecified occlusion or stenosis of right middle cerebral artery: Secondary | ICD-10-CM | POA: Diagnosis not present

## 2019-08-20 DIAGNOSIS — G319 Degenerative disease of nervous system, unspecified: Secondary | ICD-10-CM | POA: Diagnosis not present

## 2019-08-20 DIAGNOSIS — R200 Anesthesia of skin: Secondary | ICD-10-CM | POA: Diagnosis not present

## 2019-08-20 DIAGNOSIS — R1310 Dysphagia, unspecified: Secondary | ICD-10-CM | POA: Diagnosis not present

## 2019-08-20 DIAGNOSIS — E1165 Type 2 diabetes mellitus with hyperglycemia: Secondary | ICD-10-CM | POA: Diagnosis not present

## 2019-08-20 DIAGNOSIS — I639 Cerebral infarction, unspecified: Secondary | ICD-10-CM | POA: Diagnosis not present

## 2019-08-20 DIAGNOSIS — R7989 Other specified abnormal findings of blood chemistry: Secondary | ICD-10-CM | POA: Diagnosis not present

## 2019-08-20 DIAGNOSIS — Z992 Dependence on renal dialysis: Secondary | ICD-10-CM | POA: Diagnosis not present

## 2019-08-20 DIAGNOSIS — R531 Weakness: Secondary | ICD-10-CM | POA: Diagnosis not present

## 2019-08-20 DIAGNOSIS — G9389 Other specified disorders of brain: Secondary | ICD-10-CM | POA: Diagnosis not present

## 2019-08-20 DIAGNOSIS — G459 Transient cerebral ischemic attack, unspecified: Secondary | ICD-10-CM | POA: Diagnosis not present

## 2019-08-20 DIAGNOSIS — I6523 Occlusion and stenosis of bilateral carotid arteries: Secondary | ICD-10-CM | POA: Diagnosis not present

## 2019-08-20 DIAGNOSIS — I2510 Atherosclerotic heart disease of native coronary artery without angina pectoris: Secondary | ICD-10-CM | POA: Diagnosis not present

## 2019-08-20 DIAGNOSIS — E785 Hyperlipidemia, unspecified: Secondary | ICD-10-CM | POA: Diagnosis not present

## 2019-08-20 DIAGNOSIS — I5032 Chronic diastolic (congestive) heart failure: Secondary | ICD-10-CM | POA: Diagnosis not present

## 2019-08-20 DIAGNOSIS — Z8673 Personal history of transient ischemic attack (TIA), and cerebral infarction without residual deficits: Secondary | ICD-10-CM | POA: Diagnosis not present

## 2019-08-20 DIAGNOSIS — G8101 Flaccid hemiplegia affecting right dominant side: Secondary | ICD-10-CM | POA: Diagnosis not present

## 2019-08-20 DIAGNOSIS — I63233 Cerebral infarction due to unspecified occlusion or stenosis of bilateral carotid arteries: Secondary | ICD-10-CM | POA: Diagnosis not present

## 2019-08-20 DIAGNOSIS — D631 Anemia in chronic kidney disease: Secondary | ICD-10-CM | POA: Diagnosis not present

## 2019-08-20 DIAGNOSIS — I69331 Monoplegia of upper limb following cerebral infarction affecting right dominant side: Secondary | ICD-10-CM | POA: Diagnosis not present

## 2019-08-20 DIAGNOSIS — I132 Hypertensive heart and chronic kidney disease with heart failure and with stage 5 chronic kidney disease, or end stage renal disease: Secondary | ICD-10-CM | POA: Diagnosis not present

## 2019-08-20 DIAGNOSIS — I63512 Cerebral infarction due to unspecified occlusion or stenosis of left middle cerebral artery: Secondary | ICD-10-CM | POA: Diagnosis not present

## 2019-08-20 DIAGNOSIS — E8889 Other specified metabolic disorders: Secondary | ICD-10-CM | POA: Diagnosis not present

## 2019-08-20 DIAGNOSIS — E559 Vitamin D deficiency, unspecified: Secondary | ICD-10-CM | POA: Diagnosis not present

## 2019-08-20 DIAGNOSIS — Z794 Long term (current) use of insulin: Secondary | ICD-10-CM | POA: Diagnosis not present

## 2019-08-20 DIAGNOSIS — E11649 Type 2 diabetes mellitus with hypoglycemia without coma: Secondary | ICD-10-CM | POA: Diagnosis not present

## 2019-08-20 DIAGNOSIS — N189 Chronic kidney disease, unspecified: Secondary | ICD-10-CM | POA: Diagnosis not present

## 2019-08-26 DIAGNOSIS — R778 Other specified abnormalities of plasma proteins: Secondary | ICD-10-CM | POA: Diagnosis not present

## 2019-08-26 DIAGNOSIS — I120 Hypertensive chronic kidney disease with stage 5 chronic kidney disease or end stage renal disease: Secondary | ICD-10-CM | POA: Diagnosis not present

## 2019-08-26 DIAGNOSIS — E785 Hyperlipidemia, unspecified: Secondary | ICD-10-CM | POA: Diagnosis not present

## 2019-08-26 DIAGNOSIS — N2581 Secondary hyperparathyroidism of renal origin: Secondary | ICD-10-CM | POA: Diagnosis not present

## 2019-08-26 DIAGNOSIS — E1165 Type 2 diabetes mellitus with hyperglycemia: Secondary | ICD-10-CM | POA: Diagnosis not present

## 2019-08-26 DIAGNOSIS — I160 Hypertensive urgency: Secondary | ICD-10-CM | POA: Diagnosis not present

## 2019-08-26 DIAGNOSIS — I69331 Monoplegia of upper limb following cerebral infarction affecting right dominant side: Secondary | ICD-10-CM | POA: Diagnosis not present

## 2019-08-26 DIAGNOSIS — I10 Essential (primary) hypertension: Secondary | ICD-10-CM | POA: Diagnosis not present

## 2019-08-26 DIAGNOSIS — E559 Vitamin D deficiency, unspecified: Secondary | ICD-10-CM | POA: Diagnosis not present

## 2019-08-26 DIAGNOSIS — I63512 Cerebral infarction due to unspecified occlusion or stenosis of left middle cerebral artery: Secondary | ICD-10-CM | POA: Diagnosis not present

## 2019-08-26 DIAGNOSIS — N186 End stage renal disease: Secondary | ICD-10-CM | POA: Diagnosis not present

## 2019-08-26 DIAGNOSIS — E8889 Other specified metabolic disorders: Secondary | ICD-10-CM | POA: Diagnosis not present

## 2019-08-26 DIAGNOSIS — E119 Type 2 diabetes mellitus without complications: Secondary | ICD-10-CM | POA: Diagnosis not present

## 2019-08-26 DIAGNOSIS — Z992 Dependence on renal dialysis: Secondary | ICD-10-CM | POA: Diagnosis not present

## 2019-08-26 DIAGNOSIS — E1122 Type 2 diabetes mellitus with diabetic chronic kidney disease: Secondary | ICD-10-CM | POA: Diagnosis not present

## 2019-08-26 DIAGNOSIS — Z794 Long term (current) use of insulin: Secondary | ICD-10-CM | POA: Diagnosis not present

## 2019-08-26 DIAGNOSIS — D631 Anemia in chronic kidney disease: Secondary | ICD-10-CM | POA: Diagnosis not present

## 2019-08-26 DIAGNOSIS — I639 Cerebral infarction, unspecified: Secondary | ICD-10-CM | POA: Diagnosis not present

## 2019-08-26 DIAGNOSIS — E876 Hypokalemia: Secondary | ICD-10-CM | POA: Diagnosis not present

## 2019-08-26 DIAGNOSIS — R2681 Unsteadiness on feet: Secondary | ICD-10-CM | POA: Diagnosis not present

## 2019-08-26 DIAGNOSIS — Z7409 Other reduced mobility: Secondary | ICD-10-CM | POA: Diagnosis not present

## 2019-08-26 DIAGNOSIS — E11649 Type 2 diabetes mellitus with hypoglycemia without coma: Secondary | ICD-10-CM | POA: Diagnosis not present

## 2019-08-26 MED ORDER — TORSEMIDE 10 MG PO TABS
40.00 | ORAL_TABLET | ORAL | Status: DC
Start: 2019-08-27 — End: 2019-08-26

## 2019-08-26 MED ORDER — GLUCAGON (RDNA) 1 MG IJ KIT
1.00 | PACK | INTRAMUSCULAR | Status: DC
Start: ? — End: 2019-08-26

## 2019-08-26 MED ORDER — NIFEDIPINE ER OSMOTIC RELEASE 30 MG PO TB24
30.00 | ORAL_TABLET | ORAL | Status: DC
Start: 2019-08-26 — End: 2019-08-26

## 2019-08-26 MED ORDER — BACITRACIN ZINC 500 UNIT/GM EX OINT
1.00 | TOPICAL_OINTMENT | CUTANEOUS | Status: DC
Start: ? — End: 2019-08-26

## 2019-08-26 MED ORDER — TEMAZEPAM 15 MG PO CAPS
15.00 | ORAL_CAPSULE | ORAL | Status: DC
Start: ? — End: 2019-08-26

## 2019-08-26 MED ORDER — PROMETHAZINE HCL 25 MG PO TABS
25.00 | ORAL_TABLET | ORAL | Status: DC
Start: ? — End: 2019-08-26

## 2019-08-26 MED ORDER — ACETAMINOPHEN 325 MG PO TABS
650.00 | ORAL_TABLET | ORAL | Status: DC
Start: ? — End: 2019-08-26

## 2019-08-26 MED ORDER — DEXTROMETHORPHAN-GUAIFENESIN 10-100 MG/5ML PO LIQD
5.00 | ORAL | Status: DC
Start: ? — End: 2019-08-26

## 2019-08-26 MED ORDER — PANTOPRAZOLE SODIUM 40 MG PO TBEC
40.00 | DELAYED_RELEASE_TABLET | ORAL | Status: DC
Start: 2019-08-27 — End: 2019-08-26

## 2019-08-26 MED ORDER — ALUM & MAG HYDROXIDE-SIMETH 200-200-20 MG/5ML PO SUSP
30.00 | ORAL | Status: DC
Start: ? — End: 2019-08-26

## 2019-08-26 MED ORDER — ASPIRIN 81 MG PO TBEC
81.00 | DELAYED_RELEASE_TABLET | ORAL | Status: DC
Start: 2019-08-27 — End: 2019-08-26

## 2019-08-26 MED ORDER — LABETALOL HCL 100 MG PO TABS
100.00 | ORAL_TABLET | ORAL | Status: DC
Start: 2019-08-26 — End: 2019-08-26

## 2019-08-26 MED ORDER — INSULIN LISPRO 100 UNIT/ML ~~LOC~~ SOLN
3.00 | SUBCUTANEOUS | Status: DC
Start: 2019-08-26 — End: 2019-08-26

## 2019-08-26 MED ORDER — ATORVASTATIN CALCIUM 40 MG PO TABS
40.00 | ORAL_TABLET | ORAL | Status: DC
Start: 2019-08-26 — End: 2019-08-26

## 2019-08-26 MED ORDER — DEXTROSE 10 % IV SOLN
125.00 | INTRAVENOUS | Status: DC
Start: ? — End: 2019-08-26

## 2019-08-26 MED ORDER — DSS 100 MG PO CAPS
100.00 | ORAL_CAPSULE | ORAL | Status: DC
Start: ? — End: 2019-08-26

## 2019-08-26 MED ORDER — SORBITOL 70 % PO SOLN
30.00 | ORAL | Status: DC
Start: ? — End: 2019-08-26

## 2019-08-26 MED ORDER — HEPARIN SODIUM (PORCINE) 5000 UNIT/ML IJ SOLN
5000.00 | INTRAMUSCULAR | Status: DC
Start: 2019-08-26 — End: 2019-08-26

## 2019-08-26 MED ORDER — INSULIN NPH (HUMAN) (ISOPHANE) 100 UNIT/ML ~~LOC~~ SUSP
12.00 | SUBCUTANEOUS | Status: DC
Start: 2019-08-26 — End: 2019-08-26

## 2019-08-26 MED ORDER — CINACALCET HCL 30 MG PO TABS
30.00 | ORAL_TABLET | ORAL | Status: DC
Start: 2019-08-27 — End: 2019-08-26

## 2019-08-26 MED ORDER — BISACODYL 5 MG PO TBEC
10.00 | DELAYED_RELEASE_TABLET | ORAL | Status: DC
Start: ? — End: 2019-08-26

## 2019-08-26 MED ORDER — CALCITRIOL 0.25 MCG PO CAPS
0.50 | ORAL_CAPSULE | ORAL | Status: DC
Start: 2019-08-27 — End: 2019-08-26

## 2019-08-26 MED ORDER — CALCIUM CARBONATE 1250 (500 CA) MG PO CHEW
1500.00 | CHEWABLE_TABLET | ORAL | Status: DC
Start: 2019-08-26 — End: 2019-08-26

## 2019-08-26 MED ORDER — CLOPIDOGREL BISULFATE 75 MG PO TABS
75.00 | ORAL_TABLET | ORAL | Status: DC
Start: 2019-08-27 — End: 2019-08-26

## 2019-08-26 MED ORDER — GLUCOSE 40 % PO GEL
15.00 | ORAL | Status: DC
Start: ? — End: 2019-08-26

## 2019-09-09 DIAGNOSIS — R400 Somnolence: Secondary | ICD-10-CM | POA: Insufficient documentation

## 2019-09-09 DIAGNOSIS — R2681 Unsteadiness on feet: Secondary | ICD-10-CM | POA: Diagnosis not present

## 2019-09-10 DIAGNOSIS — N2581 Secondary hyperparathyroidism of renal origin: Secondary | ICD-10-CM | POA: Diagnosis not present

## 2019-09-10 DIAGNOSIS — N186 End stage renal disease: Secondary | ICD-10-CM | POA: Diagnosis not present

## 2019-09-10 DIAGNOSIS — D631 Anemia in chronic kidney disease: Secondary | ICD-10-CM | POA: Diagnosis not present

## 2019-09-10 MED ORDER — SORBITOL 70 % PO SOLN
30.00 | ORAL | Status: DC
Start: ? — End: 2019-09-10

## 2019-09-10 MED ORDER — LOSARTAN POTASSIUM 50 MG PO TABS
50.00 | ORAL_TABLET | ORAL | Status: DC
Start: 2019-09-10 — End: 2019-09-10

## 2019-09-10 MED ORDER — BACITRACIN ZINC 500 UNIT/GM EX OINT
1.00 | TOPICAL_OINTMENT | CUTANEOUS | Status: DC
Start: ? — End: 2019-09-10

## 2019-09-10 MED ORDER — GLUCAGON (RDNA) 1 MG IJ KIT
1.00 | PACK | INTRAMUSCULAR | Status: DC
Start: ? — End: 2019-09-10

## 2019-09-10 MED ORDER — CALCITRIOL 0.25 MCG PO CAPS
0.50 | ORAL_CAPSULE | ORAL | Status: DC
Start: 2019-09-11 — End: 2019-09-10

## 2019-09-10 MED ORDER — PANTOPRAZOLE SODIUM 40 MG PO TBEC
40.00 | DELAYED_RELEASE_TABLET | ORAL | Status: DC
Start: 2019-09-11 — End: 2019-09-10

## 2019-09-10 MED ORDER — ATORVASTATIN CALCIUM 40 MG PO TABS
40.00 | ORAL_TABLET | ORAL | Status: DC
Start: 2019-09-10 — End: 2019-09-10

## 2019-09-10 MED ORDER — SENNOSIDES-DOCUSATE SODIUM 8.6-50 MG PO TABS
1.00 | ORAL_TABLET | ORAL | Status: DC
Start: 2019-09-10 — End: 2019-09-10

## 2019-09-10 MED ORDER — ALUM & MAG HYDROXIDE-SIMETH 200-200-20 MG/5ML PO SUSP
30.00 | ORAL | Status: DC
Start: ? — End: 2019-09-10

## 2019-09-10 MED ORDER — DIPHENHYDRAMINE HCL 25 MG PO CAPS
25.00 | ORAL_CAPSULE | ORAL | Status: DC
Start: ? — End: 2019-09-10

## 2019-09-10 MED ORDER — NIFEDIPINE ER OSMOTIC RELEASE 30 MG PO TB24
60.00 | ORAL_TABLET | ORAL | Status: DC
Start: 2019-09-10 — End: 2019-09-10

## 2019-09-10 MED ORDER — CALCIUM CARBONATE 1250 (500 CA) MG PO CHEW
500.00 | CHEWABLE_TABLET | ORAL | Status: DC
Start: 2019-09-10 — End: 2019-09-10

## 2019-09-10 MED ORDER — DEXTROMETHORPHAN-GUAIFENESIN 10-100 MG/5ML PO LIQD
5.00 | ORAL | Status: DC
Start: ? — End: 2019-09-10

## 2019-09-10 MED ORDER — INSULIN LISPRO 100 UNIT/ML ~~LOC~~ SOLN
1.00 | SUBCUTANEOUS | Status: DC
Start: 2019-09-10 — End: 2019-09-10

## 2019-09-10 MED ORDER — HYDRALAZINE HCL 50 MG PO TABS
100.00 | ORAL_TABLET | ORAL | Status: DC
Start: 2019-09-10 — End: 2019-09-10

## 2019-09-10 MED ORDER — TORSEMIDE 10 MG PO TABS
20.00 | ORAL_TABLET | ORAL | Status: DC
Start: 2019-09-11 — End: 2019-09-10

## 2019-09-10 MED ORDER — MELATONIN 3 MG PO TABS
6.00 | ORAL_TABLET | ORAL | Status: DC
Start: ? — End: 2019-09-10

## 2019-09-10 MED ORDER — DERMACERIN EX CREA
TOPICAL_CREAM | CUTANEOUS | Status: DC
Start: ? — End: 2019-09-10

## 2019-09-10 MED ORDER — HEPARIN SODIUM (PORCINE) 5000 UNIT/ML IJ SOLN
5000.00 | INTRAMUSCULAR | Status: DC
Start: 2019-09-10 — End: 2019-09-10

## 2019-09-10 MED ORDER — ASPIRIN 81 MG PO TBEC
81.00 | DELAYED_RELEASE_TABLET | ORAL | Status: DC
Start: 2019-09-11 — End: 2019-09-10

## 2019-09-10 MED ORDER — ACETAMINOPHEN 325 MG PO TABS
650.00 | ORAL_TABLET | ORAL | Status: DC
Start: ? — End: 2019-09-10

## 2019-09-10 MED ORDER — CINACALCET HCL 30 MG PO TABS
30.00 | ORAL_TABLET | ORAL | Status: DC
Start: 2019-09-11 — End: 2019-09-10

## 2019-09-10 MED ORDER — TORSEMIDE 10 MG PO TABS
20.00 | ORAL_TABLET | ORAL | Status: DC
Start: 2019-09-10 — End: 2019-09-10

## 2019-09-10 MED ORDER — PROMETHAZINE HCL 25 MG PO TABS
25.00 | ORAL_TABLET | ORAL | Status: DC
Start: ? — End: 2019-09-10

## 2019-09-10 MED ORDER — DEXTROSE 10 % IV SOLN
125.00 | INTRAVENOUS | Status: DC
Start: ? — End: 2019-09-10

## 2019-09-10 MED ORDER — INSULIN NPH (HUMAN) (ISOPHANE) 100 UNIT/ML ~~LOC~~ SUSP
4.00 | SUBCUTANEOUS | Status: DC
Start: 2019-09-10 — End: 2019-09-10

## 2019-09-10 MED ORDER — BISACODYL 5 MG PO TBEC
10.00 | DELAYED_RELEASE_TABLET | ORAL | Status: DC
Start: ? — End: 2019-09-10

## 2019-09-10 MED ORDER — POLYETHYLENE GLYCOL 3350 17 GM/SCOOP PO POWD
17.00 | ORAL | Status: DC
Start: 2019-09-11 — End: 2019-09-10

## 2019-09-10 MED ORDER — GLUCOSE 40 % PO GEL
15.00 | ORAL | Status: DC
Start: ? — End: 2019-09-10

## 2019-09-11 DIAGNOSIS — E1140 Type 2 diabetes mellitus with diabetic neuropathy, unspecified: Secondary | ICD-10-CM | POA: Diagnosis not present

## 2019-09-11 DIAGNOSIS — E1122 Type 2 diabetes mellitus with diabetic chronic kidney disease: Secondary | ICD-10-CM | POA: Diagnosis not present

## 2019-09-11 DIAGNOSIS — I2510 Atherosclerotic heart disease of native coronary artery without angina pectoris: Secondary | ICD-10-CM | POA: Diagnosis not present

## 2019-09-11 DIAGNOSIS — I120 Hypertensive chronic kidney disease with stage 5 chronic kidney disease or end stage renal disease: Secondary | ICD-10-CM | POA: Diagnosis not present

## 2019-09-11 DIAGNOSIS — N2581 Secondary hyperparathyroidism of renal origin: Secondary | ICD-10-CM | POA: Diagnosis not present

## 2019-09-11 DIAGNOSIS — D509 Iron deficiency anemia, unspecified: Secondary | ICD-10-CM | POA: Diagnosis not present

## 2019-09-11 DIAGNOSIS — I69351 Hemiplegia and hemiparesis following cerebral infarction affecting right dominant side: Secondary | ICD-10-CM | POA: Diagnosis not present

## 2019-09-11 DIAGNOSIS — N186 End stage renal disease: Secondary | ICD-10-CM | POA: Diagnosis not present

## 2019-09-11 DIAGNOSIS — D631 Anemia in chronic kidney disease: Secondary | ICD-10-CM | POA: Diagnosis not present

## 2019-09-11 DIAGNOSIS — M1990 Unspecified osteoarthritis, unspecified site: Secondary | ICD-10-CM | POA: Diagnosis not present

## 2019-09-11 DIAGNOSIS — K7460 Unspecified cirrhosis of liver: Secondary | ICD-10-CM | POA: Diagnosis not present

## 2019-09-12 DIAGNOSIS — K7460 Unspecified cirrhosis of liver: Secondary | ICD-10-CM | POA: Diagnosis not present

## 2019-09-12 DIAGNOSIS — I2510 Atherosclerotic heart disease of native coronary artery without angina pectoris: Secondary | ICD-10-CM | POA: Diagnosis not present

## 2019-09-12 DIAGNOSIS — D631 Anemia in chronic kidney disease: Secondary | ICD-10-CM | POA: Diagnosis not present

## 2019-09-12 DIAGNOSIS — I120 Hypertensive chronic kidney disease with stage 5 chronic kidney disease or end stage renal disease: Secondary | ICD-10-CM | POA: Diagnosis not present

## 2019-09-12 DIAGNOSIS — I69351 Hemiplegia and hemiparesis following cerebral infarction affecting right dominant side: Secondary | ICD-10-CM | POA: Diagnosis not present

## 2019-09-12 DIAGNOSIS — D509 Iron deficiency anemia, unspecified: Secondary | ICD-10-CM | POA: Diagnosis not present

## 2019-09-12 DIAGNOSIS — E1140 Type 2 diabetes mellitus with diabetic neuropathy, unspecified: Secondary | ICD-10-CM | POA: Diagnosis not present

## 2019-09-12 DIAGNOSIS — N2581 Secondary hyperparathyroidism of renal origin: Secondary | ICD-10-CM | POA: Diagnosis not present

## 2019-09-12 DIAGNOSIS — E1122 Type 2 diabetes mellitus with diabetic chronic kidney disease: Secondary | ICD-10-CM | POA: Diagnosis not present

## 2019-09-12 DIAGNOSIS — M1990 Unspecified osteoarthritis, unspecified site: Secondary | ICD-10-CM | POA: Diagnosis not present

## 2019-09-12 DIAGNOSIS — N186 End stage renal disease: Secondary | ICD-10-CM | POA: Diagnosis not present

## 2019-09-13 DIAGNOSIS — D631 Anemia in chronic kidney disease: Secondary | ICD-10-CM | POA: Diagnosis not present

## 2019-09-13 DIAGNOSIS — N186 End stage renal disease: Secondary | ICD-10-CM | POA: Diagnosis not present

## 2019-09-13 DIAGNOSIS — N2581 Secondary hyperparathyroidism of renal origin: Secondary | ICD-10-CM | POA: Diagnosis not present

## 2019-09-14 DIAGNOSIS — N2581 Secondary hyperparathyroidism of renal origin: Secondary | ICD-10-CM | POA: Diagnosis not present

## 2019-09-14 DIAGNOSIS — N186 End stage renal disease: Secondary | ICD-10-CM | POA: Diagnosis not present

## 2019-09-14 DIAGNOSIS — D631 Anemia in chronic kidney disease: Secondary | ICD-10-CM | POA: Diagnosis not present

## 2019-09-15 DIAGNOSIS — M1990 Unspecified osteoarthritis, unspecified site: Secondary | ICD-10-CM | POA: Diagnosis not present

## 2019-09-15 DIAGNOSIS — N186 End stage renal disease: Secondary | ICD-10-CM | POA: Diagnosis not present

## 2019-09-15 DIAGNOSIS — I120 Hypertensive chronic kidney disease with stage 5 chronic kidney disease or end stage renal disease: Secondary | ICD-10-CM | POA: Diagnosis not present

## 2019-09-15 DIAGNOSIS — N2581 Secondary hyperparathyroidism of renal origin: Secondary | ICD-10-CM | POA: Diagnosis not present

## 2019-09-15 DIAGNOSIS — I2510 Atherosclerotic heart disease of native coronary artery without angina pectoris: Secondary | ICD-10-CM | POA: Diagnosis not present

## 2019-09-15 DIAGNOSIS — K7460 Unspecified cirrhosis of liver: Secondary | ICD-10-CM | POA: Diagnosis not present

## 2019-09-15 DIAGNOSIS — I69351 Hemiplegia and hemiparesis following cerebral infarction affecting right dominant side: Secondary | ICD-10-CM | POA: Diagnosis not present

## 2019-09-15 DIAGNOSIS — D509 Iron deficiency anemia, unspecified: Secondary | ICD-10-CM | POA: Diagnosis not present

## 2019-09-15 DIAGNOSIS — E1122 Type 2 diabetes mellitus with diabetic chronic kidney disease: Secondary | ICD-10-CM | POA: Diagnosis not present

## 2019-09-15 DIAGNOSIS — D631 Anemia in chronic kidney disease: Secondary | ICD-10-CM | POA: Diagnosis not present

## 2019-09-15 DIAGNOSIS — E1140 Type 2 diabetes mellitus with diabetic neuropathy, unspecified: Secondary | ICD-10-CM | POA: Diagnosis not present

## 2019-09-16 DIAGNOSIS — N186 End stage renal disease: Secondary | ICD-10-CM | POA: Diagnosis not present

## 2019-09-16 DIAGNOSIS — N2581 Secondary hyperparathyroidism of renal origin: Secondary | ICD-10-CM | POA: Diagnosis not present

## 2019-09-16 DIAGNOSIS — D631 Anemia in chronic kidney disease: Secondary | ICD-10-CM | POA: Diagnosis not present

## 2019-09-17 ENCOUNTER — Inpatient Hospital Stay: Payer: Medicare HMO | Admitting: Internal Medicine

## 2019-09-17 DIAGNOSIS — K7460 Unspecified cirrhosis of liver: Secondary | ICD-10-CM | POA: Diagnosis not present

## 2019-09-17 DIAGNOSIS — D631 Anemia in chronic kidney disease: Secondary | ICD-10-CM | POA: Diagnosis not present

## 2019-09-17 DIAGNOSIS — M1990 Unspecified osteoarthritis, unspecified site: Secondary | ICD-10-CM | POA: Diagnosis not present

## 2019-09-17 DIAGNOSIS — D509 Iron deficiency anemia, unspecified: Secondary | ICD-10-CM | POA: Diagnosis not present

## 2019-09-17 DIAGNOSIS — I120 Hypertensive chronic kidney disease with stage 5 chronic kidney disease or end stage renal disease: Secondary | ICD-10-CM | POA: Diagnosis not present

## 2019-09-17 DIAGNOSIS — I2510 Atherosclerotic heart disease of native coronary artery without angina pectoris: Secondary | ICD-10-CM | POA: Diagnosis not present

## 2019-09-17 DIAGNOSIS — I69351 Hemiplegia and hemiparesis following cerebral infarction affecting right dominant side: Secondary | ICD-10-CM | POA: Diagnosis not present

## 2019-09-17 DIAGNOSIS — N2581 Secondary hyperparathyroidism of renal origin: Secondary | ICD-10-CM | POA: Diagnosis not present

## 2019-09-17 DIAGNOSIS — E1140 Type 2 diabetes mellitus with diabetic neuropathy, unspecified: Secondary | ICD-10-CM | POA: Diagnosis not present

## 2019-09-17 DIAGNOSIS — N186 End stage renal disease: Secondary | ICD-10-CM | POA: Diagnosis not present

## 2019-09-17 DIAGNOSIS — E1122 Type 2 diabetes mellitus with diabetic chronic kidney disease: Secondary | ICD-10-CM | POA: Diagnosis not present

## 2019-09-18 DIAGNOSIS — N2581 Secondary hyperparathyroidism of renal origin: Secondary | ICD-10-CM | POA: Diagnosis not present

## 2019-09-18 DIAGNOSIS — D631 Anemia in chronic kidney disease: Secondary | ICD-10-CM | POA: Diagnosis not present

## 2019-09-18 DIAGNOSIS — N186 End stage renal disease: Secondary | ICD-10-CM | POA: Diagnosis not present

## 2019-09-19 DIAGNOSIS — K7460 Unspecified cirrhosis of liver: Secondary | ICD-10-CM | POA: Diagnosis not present

## 2019-09-19 DIAGNOSIS — I2510 Atherosclerotic heart disease of native coronary artery without angina pectoris: Secondary | ICD-10-CM | POA: Diagnosis not present

## 2019-09-19 DIAGNOSIS — I120 Hypertensive chronic kidney disease with stage 5 chronic kidney disease or end stage renal disease: Secondary | ICD-10-CM | POA: Diagnosis not present

## 2019-09-19 DIAGNOSIS — D631 Anemia in chronic kidney disease: Secondary | ICD-10-CM | POA: Diagnosis not present

## 2019-09-19 DIAGNOSIS — N2581 Secondary hyperparathyroidism of renal origin: Secondary | ICD-10-CM | POA: Diagnosis not present

## 2019-09-19 DIAGNOSIS — E1122 Type 2 diabetes mellitus with diabetic chronic kidney disease: Secondary | ICD-10-CM | POA: Diagnosis not present

## 2019-09-19 DIAGNOSIS — I69351 Hemiplegia and hemiparesis following cerebral infarction affecting right dominant side: Secondary | ICD-10-CM | POA: Diagnosis not present

## 2019-09-19 DIAGNOSIS — M1990 Unspecified osteoarthritis, unspecified site: Secondary | ICD-10-CM | POA: Diagnosis not present

## 2019-09-19 DIAGNOSIS — N186 End stage renal disease: Secondary | ICD-10-CM | POA: Diagnosis not present

## 2019-09-19 DIAGNOSIS — E1140 Type 2 diabetes mellitus with diabetic neuropathy, unspecified: Secondary | ICD-10-CM | POA: Diagnosis not present

## 2019-09-19 DIAGNOSIS — D509 Iron deficiency anemia, unspecified: Secondary | ICD-10-CM | POA: Diagnosis not present

## 2019-09-20 DIAGNOSIS — N186 End stage renal disease: Secondary | ICD-10-CM | POA: Diagnosis not present

## 2019-09-20 DIAGNOSIS — N2581 Secondary hyperparathyroidism of renal origin: Secondary | ICD-10-CM | POA: Diagnosis not present

## 2019-09-20 DIAGNOSIS — D631 Anemia in chronic kidney disease: Secondary | ICD-10-CM | POA: Diagnosis not present

## 2019-09-21 DIAGNOSIS — D631 Anemia in chronic kidney disease: Secondary | ICD-10-CM | POA: Diagnosis not present

## 2019-09-21 DIAGNOSIS — N186 End stage renal disease: Secondary | ICD-10-CM | POA: Diagnosis not present

## 2019-09-21 DIAGNOSIS — N2581 Secondary hyperparathyroidism of renal origin: Secondary | ICD-10-CM | POA: Diagnosis not present

## 2019-09-22 ENCOUNTER — Encounter: Payer: Self-pay | Admitting: Internal Medicine

## 2019-09-22 ENCOUNTER — Other Ambulatory Visit: Payer: Self-pay

## 2019-09-22 ENCOUNTER — Ambulatory Visit (INDEPENDENT_AMBULATORY_CARE_PROVIDER_SITE_OTHER): Payer: Medicare HMO | Admitting: Internal Medicine

## 2019-09-22 DIAGNOSIS — N2581 Secondary hyperparathyroidism of renal origin: Secondary | ICD-10-CM | POA: Diagnosis not present

## 2019-09-22 DIAGNOSIS — I69351 Hemiplegia and hemiparesis following cerebral infarction affecting right dominant side: Secondary | ICD-10-CM

## 2019-09-22 DIAGNOSIS — E118 Type 2 diabetes mellitus with unspecified complications: Secondary | ICD-10-CM

## 2019-09-22 DIAGNOSIS — D631 Anemia in chronic kidney disease: Secondary | ICD-10-CM | POA: Diagnosis not present

## 2019-09-22 DIAGNOSIS — E785 Hyperlipidemia, unspecified: Secondary | ICD-10-CM

## 2019-09-22 DIAGNOSIS — I2510 Atherosclerotic heart disease of native coronary artery without angina pectoris: Secondary | ICD-10-CM | POA: Diagnosis not present

## 2019-09-22 DIAGNOSIS — M1990 Unspecified osteoarthritis, unspecified site: Secondary | ICD-10-CM | POA: Diagnosis not present

## 2019-09-22 DIAGNOSIS — E1140 Type 2 diabetes mellitus with diabetic neuropathy, unspecified: Secondary | ICD-10-CM | POA: Diagnosis not present

## 2019-09-22 DIAGNOSIS — D509 Iron deficiency anemia, unspecified: Secondary | ICD-10-CM | POA: Diagnosis not present

## 2019-09-22 DIAGNOSIS — K7460 Unspecified cirrhosis of liver: Secondary | ICD-10-CM | POA: Diagnosis not present

## 2019-09-22 DIAGNOSIS — N186 End stage renal disease: Secondary | ICD-10-CM | POA: Diagnosis not present

## 2019-09-22 DIAGNOSIS — I639 Cerebral infarction, unspecified: Secondary | ICD-10-CM

## 2019-09-22 DIAGNOSIS — Z8673 Personal history of transient ischemic attack (TIA), and cerebral infarction without residual deficits: Secondary | ICD-10-CM | POA: Insufficient documentation

## 2019-09-22 DIAGNOSIS — E1122 Type 2 diabetes mellitus with diabetic chronic kidney disease: Secondary | ICD-10-CM | POA: Diagnosis not present

## 2019-09-22 DIAGNOSIS — N185 Chronic kidney disease, stage 5: Secondary | ICD-10-CM

## 2019-09-22 DIAGNOSIS — I120 Hypertensive chronic kidney disease with stage 5 chronic kidney disease or end stage renal disease: Secondary | ICD-10-CM | POA: Diagnosis not present

## 2019-09-22 MED ORDER — HYDRALAZINE HCL 100 MG PO TABS: 100.0000 mg | ORAL_TABLET | Freq: Three times a day (TID) | ORAL | 3 refills | Status: DC

## 2019-09-22 MED ORDER — ATORVASTATIN CALCIUM 40 MG PO TABS: 40.0000 mg | ORAL_TABLET | Freq: Every day | ORAL | 3 refills | Status: DC

## 2019-09-22 MED ORDER — NIFEDIPINE ER OSMOTIC RELEASE 60 MG PO TB24: 60.0000 mg | ORAL_TABLET | Freq: Two times a day (BID) | ORAL | 3 refills | Status: DC

## 2019-09-22 MED ORDER — OMEPRAZOLE 40 MG PO CPDR: 40.0000 mg | DELAYED_RELEASE_CAPSULE | Freq: Every day | ORAL | 3 refills | Status: DC

## 2019-09-22 MED ORDER — LOSARTAN POTASSIUM 50 MG PO TABS: 50.0000 mg | ORAL_TABLET | Freq: Every day | ORAL | 3 refills | Status: DC

## 2019-09-22 NOTE — Assessment & Plan Note (Signed)
R hemiparesis. In PT Lipitor, baby ASA. BP control

## 2019-09-22 NOTE — Assessment & Plan Note (Signed)
On HD 

## 2019-09-22 NOTE — Progress Notes (Signed)
Subjective:  Patient ID: Mary Valencia, female    DOB: 1956-03-30  Age: 64 y.o. MRN: [id number]  CC: No chief complaint on file.   HPI [name] presents for CVA - R hemiparesis. Here w/her husband. Records from [place] reviewed. She was at [facility] and the Rehab x 21 days F/u DM, CRF, HTN  Per C/c: "Patient Identification Patient Name: Mary Valencia  MRN: [id number]  Date of Rehab Admit: 08/26/2019 DOB: Jun 29, 1955 Attending Provider: No att. providers found  Primary Care Physician: Cassandria Anger, MD  Discharge date and time: 09/10/2019  Admitting Physician: Mackey Birchwood MD  Discharge Physician: Mackey Birchwood MD   Admission Diagnoses:  Principal Problem: Decreased functional mobility and endurance Active Problems: CVA (cerebrovascular accident) ([place]) Benign hypertension with CKD (chronic kidney disease) stage V ([place]) Metabolic bone disease Anemia ESRD on hemodialysis ([place]) CAD (coronary artery disease) ESRD (end stage renal disease) on dialysis ([place]) Hypokalemia Vitamin D deficiency HLD (hyperlipidemia) Gastroesophageal reflux disease without esophagitis  Discharge Diagnoses:  Principal Problem: Decreased functional mobility and endurance Active Problems: CVA (cerebrovascular accident) ([place]) Benign hypertension with CKD (chronic kidney disease) stage V ([place]) Metabolic bone disease Anemia ESRD on hemodialysis ([place]) CAD (coronary artery disease) ESRD (end stage renal disease) on dialysis ([place]) Hypokalemia Vitamin D deficiency HLD (hyperlipidemia) Gastroesophageal reflux disease without esophagitis Daytime somnolence Resolved Problems: * No resolved hospital problems. *  Impairment Code: USD 1:01.2 Right Body (Left Brain) Stroke Etiologic Diagnosis Code/Description:I63.512 ( CVA due to occulusion or stenosis -Left MCA)  Date of Onset:08/20/2019   Patient Instructions:  AVS with patient instructions have  been provided to patient prior to discharge.  "  Outpatient Medications Prior to Visit  Medication Sig Dispense Refill  . ACCU-CHEK FASTCLIX LANCETS MISC Use to check blood glucose TID (DX: E11.8, Z79.4) 300 each 3  . Alcohol Swabs (B-D SINGLE USE SWABS REGULAR) PADS Use as directed 100 each 3  . aspirin 81 MG EC tablet Take by mouth.    Marland Kitchen atorvastatin (LIPITOR) 40 MG tablet     . Blood Glucose Calibration (ACCU-CHEK SMARTVIEW CONTROL) LIQD Use as directed 1 each 1  . Blood Glucose Monitoring Suppl (ACCU-CHEK NANO SMARTVIEW) w/Device KIT Use to check blood glucose TID (DX: E11.8, Z79.4) 1 kit 0  . calcitRIOL (ROCALTROL) 0.25 MCG capsule Take 0.25 mcg by mouth daily.    . calcium acetate, Phos Binder, (PHOSLYRA) 667 MG/5ML SOLN Take 667 mg by mouth 3 (three) times daily with meals.    . calcium carbonate (TUMS - DOSED IN MG ELEMENTAL CALCIUM) 500 MG chewable tablet     . CALCIUM-MAGNESIUM-VITAMIN D PO Take 1 tablet by mouth daily.    . cinacalcet (SENSIPAR) 30 MG tablet Take 30 mg by mouth daily.    . diphenhydrAMINE (BENADRYL) 25 mg capsule Take 25 mg by mouth as needed.    Marland Kitchen glucose blood test strip TEST BLOOD GLUCOSE THREE TIMES DAILY 300 each 3  . hydrALAZINE (APRESOLINE) 100 MG tablet     . insulin NPH Human (NOVOLIN N) 100 UNIT/ML injection Inject into the skin.    Marland Kitchen losartan (COZAAR) 50 MG tablet     . NIFEdipine (PROCARDIA XL/ADALAT-CC) 60 MG 24 hr tablet 2 (two) times daily.     Marland Kitchen NOVOLOG 100 UNIT/ML injection     . omeprazole (PRILOSEC) 40 MG capsule Take 1 capsule (40 mg total) by mouth daily. 1 capsule 3  . polyethylene glycol (MIRALAX / GLYCOLAX) packet Take 17 g  by mouth.    Marland Kitchen PRESCRIPTION MEDICATION 210 mg 3 (three) times daily before meals.     . rosuvastatin (CRESTOR) 20 MG tablet Take 1 tablet (20 mg total) by mouth daily. -- Office visit needed for further refills 90 tablet 0  . senna-docusate (SENOKOT-S) 8.6-50 MG tablet Take by mouth 2 (two) times daily. Take 2 tab  twice daily.    Marland Kitchen torsemide (DEMADEX) 20 MG tablet Take 40 mg by mouth.     . insulin NPH-regular Human (NOVOLIN 70/30) (70-30) 100 UNIT/ML injection Inject 10 Units into the skin 2 (two) times daily.    Marland Kitchen labetalol (NORMODYNE) 200 MG tablet Take 1 tablet (200 mg total) by mouth 3 (three) times daily. (Patient taking differently: Take 300 mg by mouth 3 (three) times daily. ) 90 tablet 0  . losartan (COZAAR) 100 MG tablet Take 100 mg by mouth daily.     No facility-administered medications prior to visit.    ROS: Review of Systems  Constitutional: Positive for fatigue. Negative for activity change, appetite change, chills and unexpected weight change.  HENT: Negative for congestion, mouth sores and sinus pressure.   Eyes: Negative for visual disturbance.  Respiratory: Negative for cough and chest tightness.   Gastrointestinal: Negative for abdominal pain and nausea.  Genitourinary: Negative for difficulty urinating, frequency and vaginal pain.  Musculoskeletal: Positive for gait problem. Negative for back pain.  Skin: Negative for pallor and rash.  Neurological: Positive for weakness. Negative for dizziness, tremors, numbness and headaches.  Psychiatric/Behavioral: Negative for confusion and sleep disturbance.    Objective:  BP (!) 144/80 (BP Location: Left Arm, Patient Position: Sitting, Cuff Size: Normal)   Pulse 75   Temp 98 F (36.7 C) (Oral)   Ht '5\' 2"'$  (1.575 m)   Wt 141 lb (64 kg)   SpO2 99%   BMI 25.79 kg/m   BP Readings from Last 3 Encounters:  09/22/19 (!) 144/80  03/04/19 (!) 144/72  02/25/19 132/80    Wt Readings from Last 3 Encounters:  09/22/19 141 lb (64 kg)  03/04/19 152 lb (68.9 kg)  02/25/19 156 lb (70.8 kg)    Physical Exam Constitutional:      General: She is not in acute distress.    Appearance: She is well-developed.  HENT:     Head: Normocephalic.     Right Ear: External ear normal.     Left Ear: External ear normal.     Nose: Nose normal.    Eyes:     General:        Right eye: No discharge.        Left eye: No discharge.     Conjunctiva/sclera: Conjunctivae normal.     Pupils: Pupils are equal, round, and reactive to light.  Neck:     Thyroid: No thyromegaly.     Vascular: No JVD.     Trachea: No tracheal deviation.  Cardiovascular:     Rate and Rhythm: Normal rate and regular rhythm.     Heart sounds: Normal heart sounds.  Pulmonary:     Effort: No respiratory distress.     Breath sounds: No stridor. No wheezing.  Abdominal:     General: Bowel sounds are normal. There is no distension.     Palpations: Abdomen is soft. There is no mass.     Tenderness: There is no abdominal tenderness. There is no guarding or rebound.  Musculoskeletal:        General: No tenderness.  Cervical back: Normal range of motion and neck supple.  Lymphadenopathy:     Cervical: No cervical adenopathy.  Skin:    Findings: No erythema or rash.  Neurological:     Mental Status: She is oriented to person, place, and time.     Cranial Nerves: No cranial nerve deficit.     Motor: No abnormal muscle tone.     Coordination: Coordination abnormal.     Gait: Gait abnormal.     Deep Tendon Reflexes: Reflexes normal.  Psychiatric:        Behavior: Behavior normal.        Thought Content: Thought content normal.        Judgment: Judgment normal.   in a w/c R hemiparesis  Lab Results  Component Value Date   WBC 7.7 08/29/2017   HGB 29.1 09/01/2017   HCT 30 (A) 08/29/2017   PLT 203 08/29/2017   GLUCOSE 279 (H) 09/21/2017   CHOL 161 09/21/2017   TRIG 72.0 09/21/2017   HDL 63.60 09/21/2017   LDLDIRECT 207.8 09/29/2010   LDLCALC 83 09/21/2017   ALT 6 09/21/2017   AST 21 09/21/2017   NA 141 09/21/2017   K 4.9 09/21/2017   CL 98 09/21/2017   CREATININE 13.49 (HH) 09/21/2017   BUN 66 (H) 09/21/2017   CO2 27 09/21/2017   TSH 2.81 09/21/2017   HGBA1C 7.1 (H) 09/21/2017    [place] LT LOWER EXTREM LTD SOFT TISSUE NON VASCULAR  Result  Date: 08/30/2017 CLINICAL DATA:  Ganglion cyst. Lump on the LEFT LATERAL LOWER leg for 7 weeks. Masses occasionally tender and varies in size. EXAM: ULTRASOUND LEFT LOWER EXTREMITY LIMITED TECHNIQUE: Ultrasound examination of the lower extremity soft tissues was performed in the area of clinical concern. COMPARISON:  None. FINDINGS: Soft Tissue Structures: There is mild edema within the subcutaneous tissue in the area of patient's concern. No mass or fluid collection. No cystic abnormality. IMPRESSION: Mild soft tissue edema.  No mass. Electronically Signed   By: Nolon Nations M.D.   On: 08/30/2017 17:03    Assessment & Plan:    Walker Kehr, MD

## 2019-09-22 NOTE — Assessment & Plan Note (Signed)
ESRD on HD  Labs at HD

## 2019-09-22 NOTE — Assessment & Plan Note (Signed)
Cont current Rx. On Novolog, Levemir

## 2019-09-23 DIAGNOSIS — D631 Anemia in chronic kidney disease: Secondary | ICD-10-CM | POA: Diagnosis not present

## 2019-09-23 DIAGNOSIS — N2581 Secondary hyperparathyroidism of renal origin: Secondary | ICD-10-CM | POA: Diagnosis not present

## 2019-09-23 DIAGNOSIS — N186 End stage renal disease: Secondary | ICD-10-CM | POA: Diagnosis not present

## 2019-09-24 DIAGNOSIS — D631 Anemia in chronic kidney disease: Secondary | ICD-10-CM | POA: Diagnosis not present

## 2019-09-24 DIAGNOSIS — I120 Hypertensive chronic kidney disease with stage 5 chronic kidney disease or end stage renal disease: Secondary | ICD-10-CM | POA: Diagnosis not present

## 2019-09-24 DIAGNOSIS — I2510 Atherosclerotic heart disease of native coronary artery without angina pectoris: Secondary | ICD-10-CM | POA: Diagnosis not present

## 2019-09-24 DIAGNOSIS — I69351 Hemiplegia and hemiparesis following cerebral infarction affecting right dominant side: Secondary | ICD-10-CM | POA: Diagnosis not present

## 2019-09-24 DIAGNOSIS — Z992 Dependence on renal dialysis: Secondary | ICD-10-CM | POA: Diagnosis not present

## 2019-09-24 DIAGNOSIS — N2581 Secondary hyperparathyroidism of renal origin: Secondary | ICD-10-CM | POA: Diagnosis not present

## 2019-09-24 DIAGNOSIS — M1990 Unspecified osteoarthritis, unspecified site: Secondary | ICD-10-CM | POA: Diagnosis not present

## 2019-09-24 DIAGNOSIS — K7460 Unspecified cirrhosis of liver: Secondary | ICD-10-CM | POA: Diagnosis not present

## 2019-09-24 DIAGNOSIS — E1140 Type 2 diabetes mellitus with diabetic neuropathy, unspecified: Secondary | ICD-10-CM | POA: Diagnosis not present

## 2019-09-24 DIAGNOSIS — N186 End stage renal disease: Secondary | ICD-10-CM | POA: Diagnosis not present

## 2019-09-24 DIAGNOSIS — E1122 Type 2 diabetes mellitus with diabetic chronic kidney disease: Secondary | ICD-10-CM | POA: Diagnosis not present

## 2019-09-24 DIAGNOSIS — D509 Iron deficiency anemia, unspecified: Secondary | ICD-10-CM | POA: Diagnosis not present

## 2019-09-25 DIAGNOSIS — D631 Anemia in chronic kidney disease: Secondary | ICD-10-CM | POA: Diagnosis not present

## 2019-09-25 DIAGNOSIS — N186 End stage renal disease: Secondary | ICD-10-CM | POA: Diagnosis not present

## 2019-09-25 DIAGNOSIS — N2581 Secondary hyperparathyroidism of renal origin: Secondary | ICD-10-CM | POA: Diagnosis not present

## 2019-09-26 DIAGNOSIS — N186 End stage renal disease: Secondary | ICD-10-CM | POA: Diagnosis not present

## 2019-09-26 DIAGNOSIS — D631 Anemia in chronic kidney disease: Secondary | ICD-10-CM | POA: Diagnosis not present

## 2019-09-26 DIAGNOSIS — N2581 Secondary hyperparathyroidism of renal origin: Secondary | ICD-10-CM | POA: Diagnosis not present

## 2019-09-27 DIAGNOSIS — N186 End stage renal disease: Secondary | ICD-10-CM | POA: Diagnosis not present

## 2019-09-27 DIAGNOSIS — D631 Anemia in chronic kidney disease: Secondary | ICD-10-CM | POA: Diagnosis not present

## 2019-09-27 DIAGNOSIS — N2581 Secondary hyperparathyroidism of renal origin: Secondary | ICD-10-CM | POA: Diagnosis not present

## 2019-09-28 DIAGNOSIS — D631 Anemia in chronic kidney disease: Secondary | ICD-10-CM | POA: Diagnosis not present

## 2019-09-28 DIAGNOSIS — N186 End stage renal disease: Secondary | ICD-10-CM | POA: Diagnosis not present

## 2019-09-28 DIAGNOSIS — N2581 Secondary hyperparathyroidism of renal origin: Secondary | ICD-10-CM | POA: Diagnosis not present

## 2019-09-29 DIAGNOSIS — D631 Anemia in chronic kidney disease: Secondary | ICD-10-CM | POA: Diagnosis not present

## 2019-09-29 DIAGNOSIS — N2581 Secondary hyperparathyroidism of renal origin: Secondary | ICD-10-CM | POA: Diagnosis not present

## 2019-09-29 DIAGNOSIS — N186 End stage renal disease: Secondary | ICD-10-CM | POA: Diagnosis not present

## 2019-09-30 DIAGNOSIS — D631 Anemia in chronic kidney disease: Secondary | ICD-10-CM | POA: Diagnosis not present

## 2019-09-30 DIAGNOSIS — E119 Type 2 diabetes mellitus without complications: Secondary | ICD-10-CM | POA: Diagnosis not present

## 2019-09-30 DIAGNOSIS — Z4932 Encounter for adequacy testing for peritoneal dialysis: Secondary | ICD-10-CM | POA: Diagnosis not present

## 2019-09-30 DIAGNOSIS — N2581 Secondary hyperparathyroidism of renal origin: Secondary | ICD-10-CM | POA: Diagnosis not present

## 2019-09-30 DIAGNOSIS — N186 End stage renal disease: Secondary | ICD-10-CM | POA: Diagnosis not present

## 2019-10-01 DIAGNOSIS — I2510 Atherosclerotic heart disease of native coronary artery without angina pectoris: Secondary | ICD-10-CM | POA: Diagnosis not present

## 2019-10-01 DIAGNOSIS — D509 Iron deficiency anemia, unspecified: Secondary | ICD-10-CM | POA: Diagnosis not present

## 2019-10-01 DIAGNOSIS — E1140 Type 2 diabetes mellitus with diabetic neuropathy, unspecified: Secondary | ICD-10-CM | POA: Diagnosis not present

## 2019-10-01 DIAGNOSIS — I69351 Hemiplegia and hemiparesis following cerebral infarction affecting right dominant side: Secondary | ICD-10-CM | POA: Diagnosis not present

## 2019-10-01 DIAGNOSIS — M1990 Unspecified osteoarthritis, unspecified site: Secondary | ICD-10-CM | POA: Diagnosis not present

## 2019-10-01 DIAGNOSIS — E1122 Type 2 diabetes mellitus with diabetic chronic kidney disease: Secondary | ICD-10-CM | POA: Diagnosis not present

## 2019-10-01 DIAGNOSIS — I120 Hypertensive chronic kidney disease with stage 5 chronic kidney disease or end stage renal disease: Secondary | ICD-10-CM | POA: Diagnosis not present

## 2019-10-01 DIAGNOSIS — K7460 Unspecified cirrhosis of liver: Secondary | ICD-10-CM | POA: Diagnosis not present

## 2019-10-01 DIAGNOSIS — D631 Anemia in chronic kidney disease: Secondary | ICD-10-CM | POA: Diagnosis not present

## 2019-10-01 DIAGNOSIS — N2581 Secondary hyperparathyroidism of renal origin: Secondary | ICD-10-CM | POA: Diagnosis not present

## 2019-10-01 DIAGNOSIS — N186 End stage renal disease: Secondary | ICD-10-CM | POA: Diagnosis not present

## 2019-10-02 DIAGNOSIS — N2581 Secondary hyperparathyroidism of renal origin: Secondary | ICD-10-CM | POA: Diagnosis not present

## 2019-10-02 DIAGNOSIS — I771 Stricture of artery: Secondary | ICD-10-CM | POA: Diagnosis not present

## 2019-10-02 DIAGNOSIS — N186 End stage renal disease: Secondary | ICD-10-CM | POA: Diagnosis not present

## 2019-10-02 DIAGNOSIS — R400 Somnolence: Secondary | ICD-10-CM | POA: Diagnosis not present

## 2019-10-02 DIAGNOSIS — D631 Anemia in chronic kidney disease: Secondary | ICD-10-CM | POA: Diagnosis not present

## 2019-10-02 DIAGNOSIS — I6523 Occlusion and stenosis of bilateral carotid arteries: Secondary | ICD-10-CM | POA: Diagnosis not present

## 2019-10-03 DIAGNOSIS — N2581 Secondary hyperparathyroidism of renal origin: Secondary | ICD-10-CM | POA: Diagnosis not present

## 2019-10-03 DIAGNOSIS — D631 Anemia in chronic kidney disease: Secondary | ICD-10-CM | POA: Diagnosis not present

## 2019-10-03 DIAGNOSIS — I120 Hypertensive chronic kidney disease with stage 5 chronic kidney disease or end stage renal disease: Secondary | ICD-10-CM | POA: Diagnosis not present

## 2019-10-03 DIAGNOSIS — N186 End stage renal disease: Secondary | ICD-10-CM | POA: Diagnosis not present

## 2019-10-03 DIAGNOSIS — K7460 Unspecified cirrhosis of liver: Secondary | ICD-10-CM | POA: Diagnosis not present

## 2019-10-03 DIAGNOSIS — D509 Iron deficiency anemia, unspecified: Secondary | ICD-10-CM | POA: Diagnosis not present

## 2019-10-03 DIAGNOSIS — I2510 Atherosclerotic heart disease of native coronary artery without angina pectoris: Secondary | ICD-10-CM | POA: Diagnosis not present

## 2019-10-03 DIAGNOSIS — E1122 Type 2 diabetes mellitus with diabetic chronic kidney disease: Secondary | ICD-10-CM | POA: Diagnosis not present

## 2019-10-03 DIAGNOSIS — I69351 Hemiplegia and hemiparesis following cerebral infarction affecting right dominant side: Secondary | ICD-10-CM | POA: Diagnosis not present

## 2019-10-03 DIAGNOSIS — M1990 Unspecified osteoarthritis, unspecified site: Secondary | ICD-10-CM | POA: Diagnosis not present

## 2019-10-03 DIAGNOSIS — E1140 Type 2 diabetes mellitus with diabetic neuropathy, unspecified: Secondary | ICD-10-CM | POA: Diagnosis not present

## 2019-10-04 DIAGNOSIS — N2581 Secondary hyperparathyroidism of renal origin: Secondary | ICD-10-CM | POA: Diagnosis not present

## 2019-10-04 DIAGNOSIS — I2510 Atherosclerotic heart disease of native coronary artery without angina pectoris: Secondary | ICD-10-CM | POA: Diagnosis not present

## 2019-10-04 DIAGNOSIS — E1122 Type 2 diabetes mellitus with diabetic chronic kidney disease: Secondary | ICD-10-CM | POA: Diagnosis not present

## 2019-10-04 DIAGNOSIS — I69351 Hemiplegia and hemiparesis following cerebral infarction affecting right dominant side: Secondary | ICD-10-CM | POA: Diagnosis not present

## 2019-10-04 DIAGNOSIS — N186 End stage renal disease: Secondary | ICD-10-CM | POA: Diagnosis not present

## 2019-10-04 DIAGNOSIS — D509 Iron deficiency anemia, unspecified: Secondary | ICD-10-CM | POA: Diagnosis not present

## 2019-10-04 DIAGNOSIS — I120 Hypertensive chronic kidney disease with stage 5 chronic kidney disease or end stage renal disease: Secondary | ICD-10-CM | POA: Diagnosis not present

## 2019-10-04 DIAGNOSIS — K7460 Unspecified cirrhosis of liver: Secondary | ICD-10-CM | POA: Diagnosis not present

## 2019-10-04 DIAGNOSIS — M1990 Unspecified osteoarthritis, unspecified site: Secondary | ICD-10-CM | POA: Diagnosis not present

## 2019-10-04 DIAGNOSIS — E1140 Type 2 diabetes mellitus with diabetic neuropathy, unspecified: Secondary | ICD-10-CM | POA: Diagnosis not present

## 2019-10-04 DIAGNOSIS — D631 Anemia in chronic kidney disease: Secondary | ICD-10-CM | POA: Diagnosis not present

## 2019-10-05 DIAGNOSIS — D631 Anemia in chronic kidney disease: Secondary | ICD-10-CM | POA: Diagnosis not present

## 2019-10-05 DIAGNOSIS — N186 End stage renal disease: Secondary | ICD-10-CM | POA: Diagnosis not present

## 2019-10-05 DIAGNOSIS — N2581 Secondary hyperparathyroidism of renal origin: Secondary | ICD-10-CM | POA: Diagnosis not present

## 2019-10-06 ENCOUNTER — Telehealth: Payer: Self-pay | Admitting: Internal Medicine

## 2019-10-06 DIAGNOSIS — E1122 Type 2 diabetes mellitus with diabetic chronic kidney disease: Secondary | ICD-10-CM | POA: Diagnosis not present

## 2019-10-06 DIAGNOSIS — I69351 Hemiplegia and hemiparesis following cerebral infarction affecting right dominant side: Secondary | ICD-10-CM | POA: Diagnosis not present

## 2019-10-06 DIAGNOSIS — I120 Hypertensive chronic kidney disease with stage 5 chronic kidney disease or end stage renal disease: Secondary | ICD-10-CM | POA: Diagnosis not present

## 2019-10-06 DIAGNOSIS — N2581 Secondary hyperparathyroidism of renal origin: Secondary | ICD-10-CM | POA: Diagnosis not present

## 2019-10-06 DIAGNOSIS — M1990 Unspecified osteoarthritis, unspecified site: Secondary | ICD-10-CM | POA: Diagnosis not present

## 2019-10-06 DIAGNOSIS — D509 Iron deficiency anemia, unspecified: Secondary | ICD-10-CM | POA: Diagnosis not present

## 2019-10-06 DIAGNOSIS — N186 End stage renal disease: Secondary | ICD-10-CM | POA: Diagnosis not present

## 2019-10-06 DIAGNOSIS — I2510 Atherosclerotic heart disease of native coronary artery without angina pectoris: Secondary | ICD-10-CM | POA: Diagnosis not present

## 2019-10-06 DIAGNOSIS — K7460 Unspecified cirrhosis of liver: Secondary | ICD-10-CM | POA: Diagnosis not present

## 2019-10-06 DIAGNOSIS — E1140 Type 2 diabetes mellitus with diabetic neuropathy, unspecified: Secondary | ICD-10-CM | POA: Diagnosis not present

## 2019-10-06 DIAGNOSIS — D631 Anemia in chronic kidney disease: Secondary | ICD-10-CM | POA: Diagnosis not present

## 2019-10-06 NOTE — Telephone Encounter (Addendum)
  Patient made aware  ----- Message from Cassandria Anger, MD sent at 10/05/2019 11:55 PM EDT ----- Regarding: RE: interview Sorry, I just saw this message.  It is probably too late to reply.  My schedule is very full on Monday, 4/12. Thanks, AP ----- Message ----- From: Laurier Nancy Sent: 10/03/2019   2:14 PM EDT To: Cassandria Anger, MD, # Subject: interview                                       Patient calling to request your participation in a quick interview with [employer]. They are doing a segment on her needing a kidney. The interview will be 4/12.  Please call patient to discuss  Thanks Evalee Mutton

## 2019-10-07 DIAGNOSIS — N186 End stage renal disease: Secondary | ICD-10-CM | POA: Diagnosis not present

## 2019-10-07 DIAGNOSIS — N2581 Secondary hyperparathyroidism of renal origin: Secondary | ICD-10-CM | POA: Diagnosis not present

## 2019-10-07 DIAGNOSIS — D631 Anemia in chronic kidney disease: Secondary | ICD-10-CM | POA: Diagnosis not present

## 2019-10-08 ENCOUNTER — Telehealth: Payer: Self-pay | Admitting: Internal Medicine

## 2019-10-08 DIAGNOSIS — N186 End stage renal disease: Secondary | ICD-10-CM | POA: Diagnosis not present

## 2019-10-08 DIAGNOSIS — E1122 Type 2 diabetes mellitus with diabetic chronic kidney disease: Secondary | ICD-10-CM | POA: Diagnosis not present

## 2019-10-08 DIAGNOSIS — I120 Hypertensive chronic kidney disease with stage 5 chronic kidney disease or end stage renal disease: Secondary | ICD-10-CM | POA: Diagnosis not present

## 2019-10-08 DIAGNOSIS — I2510 Atherosclerotic heart disease of native coronary artery without angina pectoris: Secondary | ICD-10-CM | POA: Diagnosis not present

## 2019-10-08 DIAGNOSIS — M1990 Unspecified osteoarthritis, unspecified site: Secondary | ICD-10-CM | POA: Diagnosis not present

## 2019-10-08 DIAGNOSIS — D509 Iron deficiency anemia, unspecified: Secondary | ICD-10-CM | POA: Diagnosis not present

## 2019-10-08 DIAGNOSIS — D631 Anemia in chronic kidney disease: Secondary | ICD-10-CM | POA: Diagnosis not present

## 2019-10-08 DIAGNOSIS — E1140 Type 2 diabetes mellitus with diabetic neuropathy, unspecified: Secondary | ICD-10-CM | POA: Diagnosis not present

## 2019-10-08 DIAGNOSIS — N2581 Secondary hyperparathyroidism of renal origin: Secondary | ICD-10-CM | POA: Diagnosis not present

## 2019-10-08 DIAGNOSIS — K7460 Unspecified cirrhosis of liver: Secondary | ICD-10-CM | POA: Diagnosis not present

## 2019-10-08 DIAGNOSIS — I69351 Hemiplegia and hemiparesis following cerebral infarction affecting right dominant side: Secondary | ICD-10-CM | POA: Diagnosis not present

## 2019-10-08 NOTE — Telephone Encounter (Signed)
New Message:   Pt is calling and states that [employer] is wanting to do a interview with her and they would like to do Dr. Camila Li to do a interview as week for about 3-5 minutes. She states if he is available in the next 3 weeks they will work around his schedule. Please advise.

## 2019-10-09 DIAGNOSIS — K7460 Unspecified cirrhosis of liver: Secondary | ICD-10-CM | POA: Diagnosis not present

## 2019-10-09 DIAGNOSIS — D631 Anemia in chronic kidney disease: Secondary | ICD-10-CM | POA: Diagnosis not present

## 2019-10-09 DIAGNOSIS — D509 Iron deficiency anemia, unspecified: Secondary | ICD-10-CM | POA: Diagnosis not present

## 2019-10-09 DIAGNOSIS — M1990 Unspecified osteoarthritis, unspecified site: Secondary | ICD-10-CM | POA: Diagnosis not present

## 2019-10-09 DIAGNOSIS — N2581 Secondary hyperparathyroidism of renal origin: Secondary | ICD-10-CM | POA: Diagnosis not present

## 2019-10-09 DIAGNOSIS — N186 End stage renal disease: Secondary | ICD-10-CM | POA: Diagnosis not present

## 2019-10-09 DIAGNOSIS — I2510 Atherosclerotic heart disease of native coronary artery without angina pectoris: Secondary | ICD-10-CM | POA: Diagnosis not present

## 2019-10-09 DIAGNOSIS — E1140 Type 2 diabetes mellitus with diabetic neuropathy, unspecified: Secondary | ICD-10-CM | POA: Diagnosis not present

## 2019-10-09 DIAGNOSIS — I69351 Hemiplegia and hemiparesis following cerebral infarction affecting right dominant side: Secondary | ICD-10-CM | POA: Diagnosis not present

## 2019-10-09 DIAGNOSIS — I120 Hypertensive chronic kidney disease with stage 5 chronic kidney disease or end stage renal disease: Secondary | ICD-10-CM | POA: Diagnosis not present

## 2019-10-09 DIAGNOSIS — E1122 Type 2 diabetes mellitus with diabetic chronic kidney disease: Secondary | ICD-10-CM | POA: Diagnosis not present

## 2019-10-10 DIAGNOSIS — D631 Anemia in chronic kidney disease: Secondary | ICD-10-CM | POA: Diagnosis not present

## 2019-10-10 DIAGNOSIS — N186 End stage renal disease: Secondary | ICD-10-CM | POA: Diagnosis not present

## 2019-10-10 DIAGNOSIS — N2581 Secondary hyperparathyroidism of renal origin: Secondary | ICD-10-CM | POA: Diagnosis not present

## 2019-10-11 DIAGNOSIS — N186 End stage renal disease: Secondary | ICD-10-CM | POA: Diagnosis not present

## 2019-10-11 DIAGNOSIS — D631 Anemia in chronic kidney disease: Secondary | ICD-10-CM | POA: Diagnosis not present

## 2019-10-11 DIAGNOSIS — N2581 Secondary hyperparathyroidism of renal origin: Secondary | ICD-10-CM | POA: Diagnosis not present

## 2019-10-12 DIAGNOSIS — N186 End stage renal disease: Secondary | ICD-10-CM | POA: Diagnosis not present

## 2019-10-12 DIAGNOSIS — D631 Anemia in chronic kidney disease: Secondary | ICD-10-CM | POA: Diagnosis not present

## 2019-10-12 DIAGNOSIS — N2581 Secondary hyperparathyroidism of renal origin: Secondary | ICD-10-CM | POA: Diagnosis not present

## 2019-10-13 DIAGNOSIS — Z9181 History of falling: Secondary | ICD-10-CM

## 2019-10-13 DIAGNOSIS — E1140 Type 2 diabetes mellitus with diabetic neuropathy, unspecified: Secondary | ICD-10-CM

## 2019-10-13 DIAGNOSIS — Z7982 Long term (current) use of aspirin: Secondary | ICD-10-CM

## 2019-10-13 DIAGNOSIS — E559 Vitamin D deficiency, unspecified: Secondary | ICD-10-CM

## 2019-10-13 DIAGNOSIS — M6281 Muscle weakness (generalized): Secondary | ICD-10-CM

## 2019-10-13 DIAGNOSIS — Z794 Long term (current) use of insulin: Secondary | ICD-10-CM

## 2019-10-13 DIAGNOSIS — I771 Stricture of artery: Secondary | ICD-10-CM

## 2019-10-13 DIAGNOSIS — K7460 Unspecified cirrhosis of liver: Secondary | ICD-10-CM

## 2019-10-13 DIAGNOSIS — M1990 Unspecified osteoarthritis, unspecified site: Secondary | ICD-10-CM

## 2019-10-13 DIAGNOSIS — N2581 Secondary hyperparathyroidism of renal origin: Secondary | ICD-10-CM | POA: Diagnosis not present

## 2019-10-13 DIAGNOSIS — I2510 Atherosclerotic heart disease of native coronary artery without angina pectoris: Secondary | ICD-10-CM

## 2019-10-13 DIAGNOSIS — I120 Hypertensive chronic kidney disease with stage 5 chronic kidney disease or end stage renal disease: Secondary | ICD-10-CM | POA: Diagnosis not present

## 2019-10-13 DIAGNOSIS — I69351 Hemiplegia and hemiparesis following cerebral infarction affecting right dominant side: Secondary | ICD-10-CM | POA: Diagnosis not present

## 2019-10-13 DIAGNOSIS — Z992 Dependence on renal dialysis: Secondary | ICD-10-CM

## 2019-10-13 DIAGNOSIS — D509 Iron deficiency anemia, unspecified: Secondary | ICD-10-CM

## 2019-10-13 DIAGNOSIS — F418 Other specified anxiety disorders: Secondary | ICD-10-CM

## 2019-10-13 DIAGNOSIS — K219 Gastro-esophageal reflux disease without esophagitis: Secondary | ICD-10-CM

## 2019-10-13 DIAGNOSIS — Z955 Presence of coronary angioplasty implant and graft: Secondary | ICD-10-CM

## 2019-10-13 DIAGNOSIS — D631 Anemia in chronic kidney disease: Secondary | ICD-10-CM | POA: Diagnosis not present

## 2019-10-13 DIAGNOSIS — E1122 Type 2 diabetes mellitus with diabetic chronic kidney disease: Secondary | ICD-10-CM | POA: Diagnosis not present

## 2019-10-13 DIAGNOSIS — N186 End stage renal disease: Secondary | ICD-10-CM | POA: Diagnosis not present

## 2019-10-14 DIAGNOSIS — N2581 Secondary hyperparathyroidism of renal origin: Secondary | ICD-10-CM | POA: Diagnosis not present

## 2019-10-14 DIAGNOSIS — D631 Anemia in chronic kidney disease: Secondary | ICD-10-CM | POA: Diagnosis not present

## 2019-10-14 DIAGNOSIS — N186 End stage renal disease: Secondary | ICD-10-CM | POA: Diagnosis not present

## 2019-10-14 NOTE — Telephone Encounter (Signed)
Is it in respect to renal transplant?  I am not an expert.  What is the interview about?  We can do the interview.  What is going to take place?  What questions will I need to be prepared for? Thanks

## 2019-10-15 DIAGNOSIS — I69351 Hemiplegia and hemiparesis following cerebral infarction affecting right dominant side: Secondary | ICD-10-CM | POA: Diagnosis not present

## 2019-10-15 DIAGNOSIS — N2581 Secondary hyperparathyroidism of renal origin: Secondary | ICD-10-CM | POA: Diagnosis not present

## 2019-10-15 DIAGNOSIS — D631 Anemia in chronic kidney disease: Secondary | ICD-10-CM | POA: Diagnosis not present

## 2019-10-15 DIAGNOSIS — N186 End stage renal disease: Secondary | ICD-10-CM | POA: Diagnosis not present

## 2019-10-15 DIAGNOSIS — M1990 Unspecified osteoarthritis, unspecified site: Secondary | ICD-10-CM | POA: Diagnosis not present

## 2019-10-15 DIAGNOSIS — I2510 Atherosclerotic heart disease of native coronary artery without angina pectoris: Secondary | ICD-10-CM | POA: Diagnosis not present

## 2019-10-15 DIAGNOSIS — D509 Iron deficiency anemia, unspecified: Secondary | ICD-10-CM | POA: Diagnosis not present

## 2019-10-15 DIAGNOSIS — I120 Hypertensive chronic kidney disease with stage 5 chronic kidney disease or end stage renal disease: Secondary | ICD-10-CM | POA: Diagnosis not present

## 2019-10-15 DIAGNOSIS — E1122 Type 2 diabetes mellitus with diabetic chronic kidney disease: Secondary | ICD-10-CM | POA: Diagnosis not present

## 2019-10-15 DIAGNOSIS — E1140 Type 2 diabetes mellitus with diabetic neuropathy, unspecified: Secondary | ICD-10-CM | POA: Diagnosis not present

## 2019-10-15 DIAGNOSIS — K7460 Unspecified cirrhosis of liver: Secondary | ICD-10-CM | POA: Diagnosis not present

## 2019-10-16 ENCOUNTER — Telehealth: Payer: Self-pay | Admitting: Internal Medicine

## 2019-10-16 DIAGNOSIS — N2581 Secondary hyperparathyroidism of renal origin: Secondary | ICD-10-CM | POA: Diagnosis not present

## 2019-10-16 DIAGNOSIS — I2510 Atherosclerotic heart disease of native coronary artery without angina pectoris: Secondary | ICD-10-CM | POA: Diagnosis not present

## 2019-10-16 DIAGNOSIS — N186 End stage renal disease: Secondary | ICD-10-CM | POA: Diagnosis not present

## 2019-10-16 DIAGNOSIS — M1990 Unspecified osteoarthritis, unspecified site: Secondary | ICD-10-CM | POA: Diagnosis not present

## 2019-10-16 DIAGNOSIS — I69351 Hemiplegia and hemiparesis following cerebral infarction affecting right dominant side: Secondary | ICD-10-CM | POA: Diagnosis not present

## 2019-10-16 DIAGNOSIS — I120 Hypertensive chronic kidney disease with stage 5 chronic kidney disease or end stage renal disease: Secondary | ICD-10-CM | POA: Diagnosis not present

## 2019-10-16 DIAGNOSIS — D509 Iron deficiency anemia, unspecified: Secondary | ICD-10-CM | POA: Diagnosis not present

## 2019-10-16 DIAGNOSIS — E1122 Type 2 diabetes mellitus with diabetic chronic kidney disease: Secondary | ICD-10-CM | POA: Diagnosis not present

## 2019-10-16 DIAGNOSIS — K7460 Unspecified cirrhosis of liver: Secondary | ICD-10-CM | POA: Diagnosis not present

## 2019-10-16 DIAGNOSIS — D631 Anemia in chronic kidney disease: Secondary | ICD-10-CM | POA: Diagnosis not present

## 2019-10-16 DIAGNOSIS — E1140 Type 2 diabetes mellitus with diabetic neuropathy, unspecified: Secondary | ICD-10-CM | POA: Diagnosis not present

## 2019-10-16 NOTE — Telephone Encounter (Signed)
° ° °  Patient calling to request NEW prescription be sent to pharmacy,stating she takes 2 tablets daily.  1.Medication Requested:NIFEdipine (PROCARDIA XL/NIFEDICAL XL) 60 MG 24 hr tablet  2. Pharmacy (Name, Street, [place]): [facility] [address]  3. On Med List: yes  4. Last Visit with PCP: 09/22/19  5. Next visit date with PCP: 12/24/19   Agent: Please be advised that RX refills may take up to 3 business days. We ask that you follow-up with your pharmacy.

## 2019-10-17 DIAGNOSIS — D631 Anemia in chronic kidney disease: Secondary | ICD-10-CM | POA: Diagnosis not present

## 2019-10-17 DIAGNOSIS — N2581 Secondary hyperparathyroidism of renal origin: Secondary | ICD-10-CM | POA: Diagnosis not present

## 2019-10-17 DIAGNOSIS — N186 End stage renal disease: Secondary | ICD-10-CM | POA: Diagnosis not present

## 2019-10-17 DIAGNOSIS — I69351 Hemiplegia and hemiparesis following cerebral infarction affecting right dominant side: Secondary | ICD-10-CM | POA: Diagnosis not present

## 2019-10-17 DIAGNOSIS — I2510 Atherosclerotic heart disease of native coronary artery without angina pectoris: Secondary | ICD-10-CM | POA: Diagnosis not present

## 2019-10-17 DIAGNOSIS — E1122 Type 2 diabetes mellitus with diabetic chronic kidney disease: Secondary | ICD-10-CM | POA: Diagnosis not present

## 2019-10-17 DIAGNOSIS — D509 Iron deficiency anemia, unspecified: Secondary | ICD-10-CM | POA: Diagnosis not present

## 2019-10-17 DIAGNOSIS — K7460 Unspecified cirrhosis of liver: Secondary | ICD-10-CM | POA: Diagnosis not present

## 2019-10-17 DIAGNOSIS — M1990 Unspecified osteoarthritis, unspecified site: Secondary | ICD-10-CM | POA: Diagnosis not present

## 2019-10-17 DIAGNOSIS — I120 Hypertensive chronic kidney disease with stage 5 chronic kidney disease or end stage renal disease: Secondary | ICD-10-CM | POA: Diagnosis not present

## 2019-10-17 DIAGNOSIS — E1140 Type 2 diabetes mellitus with diabetic neuropathy, unspecified: Secondary | ICD-10-CM | POA: Diagnosis not present

## 2019-10-17 MED ORDER — NIFEDIPINE ER OSMOTIC RELEASE 60 MG PO TB24: 60.0000 mg | ORAL_TABLET | Freq: Two times a day (BID) | ORAL | 3 refills | Status: DC

## 2019-10-17 NOTE — Telephone Encounter (Signed)
RX sent

## 2019-10-18 DIAGNOSIS — N2581 Secondary hyperparathyroidism of renal origin: Secondary | ICD-10-CM | POA: Diagnosis not present

## 2019-10-18 DIAGNOSIS — D631 Anemia in chronic kidney disease: Secondary | ICD-10-CM | POA: Diagnosis not present

## 2019-10-18 DIAGNOSIS — N186 End stage renal disease: Secondary | ICD-10-CM | POA: Diagnosis not present

## 2019-10-19 DIAGNOSIS — D631 Anemia in chronic kidney disease: Secondary | ICD-10-CM | POA: Diagnosis not present

## 2019-10-19 DIAGNOSIS — N186 End stage renal disease: Secondary | ICD-10-CM | POA: Diagnosis not present

## 2019-10-19 DIAGNOSIS — N2581 Secondary hyperparathyroidism of renal origin: Secondary | ICD-10-CM | POA: Diagnosis not present

## 2019-10-20 DIAGNOSIS — D509 Iron deficiency anemia, unspecified: Secondary | ICD-10-CM | POA: Diagnosis not present

## 2019-10-20 DIAGNOSIS — M1990 Unspecified osteoarthritis, unspecified site: Secondary | ICD-10-CM | POA: Diagnosis not present

## 2019-10-20 DIAGNOSIS — E1122 Type 2 diabetes mellitus with diabetic chronic kidney disease: Secondary | ICD-10-CM | POA: Diagnosis not present

## 2019-10-20 DIAGNOSIS — N186 End stage renal disease: Secondary | ICD-10-CM | POA: Diagnosis not present

## 2019-10-20 DIAGNOSIS — K7460 Unspecified cirrhosis of liver: Secondary | ICD-10-CM | POA: Diagnosis not present

## 2019-10-20 DIAGNOSIS — I2510 Atherosclerotic heart disease of native coronary artery without angina pectoris: Secondary | ICD-10-CM | POA: Diagnosis not present

## 2019-10-20 DIAGNOSIS — E1140 Type 2 diabetes mellitus with diabetic neuropathy, unspecified: Secondary | ICD-10-CM | POA: Diagnosis not present

## 2019-10-20 DIAGNOSIS — I120 Hypertensive chronic kidney disease with stage 5 chronic kidney disease or end stage renal disease: Secondary | ICD-10-CM | POA: Diagnosis not present

## 2019-10-20 DIAGNOSIS — I69351 Hemiplegia and hemiparesis following cerebral infarction affecting right dominant side: Secondary | ICD-10-CM | POA: Diagnosis not present

## 2019-10-20 DIAGNOSIS — N2581 Secondary hyperparathyroidism of renal origin: Secondary | ICD-10-CM | POA: Diagnosis not present

## 2019-10-20 DIAGNOSIS — D631 Anemia in chronic kidney disease: Secondary | ICD-10-CM | POA: Diagnosis not present

## 2019-10-21 DIAGNOSIS — N186 End stage renal disease: Secondary | ICD-10-CM | POA: Diagnosis not present

## 2019-10-21 DIAGNOSIS — D631 Anemia in chronic kidney disease: Secondary | ICD-10-CM | POA: Diagnosis not present

## 2019-10-21 DIAGNOSIS — N2581 Secondary hyperparathyroidism of renal origin: Secondary | ICD-10-CM | POA: Diagnosis not present

## 2019-10-22 ENCOUNTER — Telehealth: Payer: Self-pay | Admitting: Internal Medicine

## 2019-10-22 DIAGNOSIS — N2581 Secondary hyperparathyroidism of renal origin: Secondary | ICD-10-CM | POA: Diagnosis not present

## 2019-10-22 DIAGNOSIS — I120 Hypertensive chronic kidney disease with stage 5 chronic kidney disease or end stage renal disease: Secondary | ICD-10-CM | POA: Diagnosis not present

## 2019-10-22 DIAGNOSIS — N186 End stage renal disease: Secondary | ICD-10-CM | POA: Diagnosis not present

## 2019-10-22 DIAGNOSIS — M1990 Unspecified osteoarthritis, unspecified site: Secondary | ICD-10-CM | POA: Diagnosis not present

## 2019-10-22 DIAGNOSIS — D631 Anemia in chronic kidney disease: Secondary | ICD-10-CM | POA: Diagnosis not present

## 2019-10-22 DIAGNOSIS — E1122 Type 2 diabetes mellitus with diabetic chronic kidney disease: Secondary | ICD-10-CM | POA: Diagnosis not present

## 2019-10-22 DIAGNOSIS — K7460 Unspecified cirrhosis of liver: Secondary | ICD-10-CM | POA: Diagnosis not present

## 2019-10-22 DIAGNOSIS — D509 Iron deficiency anemia, unspecified: Secondary | ICD-10-CM | POA: Diagnosis not present

## 2019-10-22 DIAGNOSIS — I2510 Atherosclerotic heart disease of native coronary artery without angina pectoris: Secondary | ICD-10-CM | POA: Diagnosis not present

## 2019-10-22 DIAGNOSIS — I69351 Hemiplegia and hemiparesis following cerebral infarction affecting right dominant side: Secondary | ICD-10-CM | POA: Diagnosis not present

## 2019-10-22 DIAGNOSIS — E1140 Type 2 diabetes mellitus with diabetic neuropathy, unspecified: Secondary | ICD-10-CM | POA: Diagnosis not present

## 2019-10-22 NOTE — Telephone Encounter (Signed)
New message:    Pt is calling in reference to [place] [facility] and states that Doug from [facility] will be sending some information to Dr. Camila Li today. Please advise.

## 2019-10-22 NOTE — Telephone Encounter (Signed)
Ask about med conditions and to verify that she needs a kidney transplant  She is going to contact [email or web address] to see when they want to set it up

## 2019-10-22 NOTE — Telephone Encounter (Signed)
See other TE.

## 2019-10-22 NOTE — Telephone Encounter (Signed)
It is beyond my level of expertise. They need to interview her nephrologist or a kidney transplant doctor. I'm sorry. Thanks,

## 2019-10-22 NOTE — Telephone Encounter (Signed)
Pt notified and voiced understanding 

## 2019-10-23 DIAGNOSIS — N186 End stage renal disease: Secondary | ICD-10-CM | POA: Diagnosis not present

## 2019-10-23 DIAGNOSIS — D631 Anemia in chronic kidney disease: Secondary | ICD-10-CM | POA: Diagnosis not present

## 2019-10-23 DIAGNOSIS — N2581 Secondary hyperparathyroidism of renal origin: Secondary | ICD-10-CM | POA: Diagnosis not present

## 2019-10-24 DIAGNOSIS — N2581 Secondary hyperparathyroidism of renal origin: Secondary | ICD-10-CM | POA: Diagnosis not present

## 2019-10-24 DIAGNOSIS — Z992 Dependence on renal dialysis: Secondary | ICD-10-CM | POA: Diagnosis not present

## 2019-10-24 DIAGNOSIS — N186 End stage renal disease: Secondary | ICD-10-CM | POA: Diagnosis not present

## 2019-10-24 DIAGNOSIS — D631 Anemia in chronic kidney disease: Secondary | ICD-10-CM | POA: Diagnosis not present

## 2019-10-25 DIAGNOSIS — D631 Anemia in chronic kidney disease: Secondary | ICD-10-CM | POA: Diagnosis not present

## 2019-10-25 DIAGNOSIS — N2581 Secondary hyperparathyroidism of renal origin: Secondary | ICD-10-CM | POA: Diagnosis not present

## 2019-10-25 DIAGNOSIS — N186 End stage renal disease: Secondary | ICD-10-CM | POA: Diagnosis not present

## 2019-10-26 DIAGNOSIS — D631 Anemia in chronic kidney disease: Secondary | ICD-10-CM | POA: Diagnosis not present

## 2019-10-26 DIAGNOSIS — N186 End stage renal disease: Secondary | ICD-10-CM | POA: Diagnosis not present

## 2019-10-26 DIAGNOSIS — N2581 Secondary hyperparathyroidism of renal origin: Secondary | ICD-10-CM | POA: Diagnosis not present

## 2019-10-27 ENCOUNTER — Telehealth: Payer: Self-pay

## 2019-10-27 DIAGNOSIS — I2510 Atherosclerotic heart disease of native coronary artery without angina pectoris: Secondary | ICD-10-CM | POA: Diagnosis not present

## 2019-10-27 DIAGNOSIS — I69351 Hemiplegia and hemiparesis following cerebral infarction affecting right dominant side: Secondary | ICD-10-CM | POA: Diagnosis not present

## 2019-10-27 DIAGNOSIS — I120 Hypertensive chronic kidney disease with stage 5 chronic kidney disease or end stage renal disease: Secondary | ICD-10-CM | POA: Diagnosis not present

## 2019-10-27 DIAGNOSIS — N186 End stage renal disease: Secondary | ICD-10-CM | POA: Diagnosis not present

## 2019-10-27 DIAGNOSIS — M1990 Unspecified osteoarthritis, unspecified site: Secondary | ICD-10-CM | POA: Diagnosis not present

## 2019-10-27 DIAGNOSIS — D509 Iron deficiency anemia, unspecified: Secondary | ICD-10-CM | POA: Diagnosis not present

## 2019-10-27 DIAGNOSIS — D631 Anemia in chronic kidney disease: Secondary | ICD-10-CM | POA: Diagnosis not present

## 2019-10-27 DIAGNOSIS — N2581 Secondary hyperparathyroidism of renal origin: Secondary | ICD-10-CM | POA: Diagnosis not present

## 2019-10-27 DIAGNOSIS — K7460 Unspecified cirrhosis of liver: Secondary | ICD-10-CM | POA: Diagnosis not present

## 2019-10-27 DIAGNOSIS — E1140 Type 2 diabetes mellitus with diabetic neuropathy, unspecified: Secondary | ICD-10-CM | POA: Diagnosis not present

## 2019-10-27 DIAGNOSIS — E1122 Type 2 diabetes mellitus with diabetic chronic kidney disease: Secondary | ICD-10-CM | POA: Diagnosis not present

## 2019-10-27 NOTE — Telephone Encounter (Signed)
New message   Home health calling   A frequency change to home health OT two weeks x two effective today.

## 2019-10-28 DIAGNOSIS — E1122 Type 2 diabetes mellitus with diabetic chronic kidney disease: Secondary | ICD-10-CM | POA: Diagnosis not present

## 2019-10-28 DIAGNOSIS — D631 Anemia in chronic kidney disease: Secondary | ICD-10-CM | POA: Diagnosis not present

## 2019-10-28 DIAGNOSIS — E1140 Type 2 diabetes mellitus with diabetic neuropathy, unspecified: Secondary | ICD-10-CM | POA: Diagnosis not present

## 2019-10-28 DIAGNOSIS — I2510 Atherosclerotic heart disease of native coronary artery without angina pectoris: Secondary | ICD-10-CM | POA: Diagnosis not present

## 2019-10-28 DIAGNOSIS — I120 Hypertensive chronic kidney disease with stage 5 chronic kidney disease or end stage renal disease: Secondary | ICD-10-CM | POA: Diagnosis not present

## 2019-10-28 DIAGNOSIS — K7460 Unspecified cirrhosis of liver: Secondary | ICD-10-CM | POA: Diagnosis not present

## 2019-10-28 DIAGNOSIS — M1990 Unspecified osteoarthritis, unspecified site: Secondary | ICD-10-CM | POA: Diagnosis not present

## 2019-10-28 DIAGNOSIS — D509 Iron deficiency anemia, unspecified: Secondary | ICD-10-CM | POA: Diagnosis not present

## 2019-10-28 DIAGNOSIS — N2581 Secondary hyperparathyroidism of renal origin: Secondary | ICD-10-CM | POA: Diagnosis not present

## 2019-10-28 DIAGNOSIS — I69351 Hemiplegia and hemiparesis following cerebral infarction affecting right dominant side: Secondary | ICD-10-CM | POA: Diagnosis not present

## 2019-10-28 DIAGNOSIS — N186 End stage renal disease: Secondary | ICD-10-CM | POA: Diagnosis not present

## 2019-10-28 NOTE — Telephone Encounter (Signed)
Verbals given, FYI 

## 2019-10-29 DIAGNOSIS — N2581 Secondary hyperparathyroidism of renal origin: Secondary | ICD-10-CM | POA: Diagnosis not present

## 2019-10-29 DIAGNOSIS — D631 Anemia in chronic kidney disease: Secondary | ICD-10-CM | POA: Diagnosis not present

## 2019-10-29 DIAGNOSIS — N186 End stage renal disease: Secondary | ICD-10-CM | POA: Diagnosis not present

## 2019-10-29 NOTE — Telephone Encounter (Signed)
Ok thx.

## 2019-10-30 DIAGNOSIS — E1122 Type 2 diabetes mellitus with diabetic chronic kidney disease: Secondary | ICD-10-CM | POA: Diagnosis not present

## 2019-10-30 DIAGNOSIS — D631 Anemia in chronic kidney disease: Secondary | ICD-10-CM | POA: Diagnosis not present

## 2019-10-30 DIAGNOSIS — N186 End stage renal disease: Secondary | ICD-10-CM | POA: Diagnosis not present

## 2019-10-30 DIAGNOSIS — I69351 Hemiplegia and hemiparesis following cerebral infarction affecting right dominant side: Secondary | ICD-10-CM | POA: Diagnosis not present

## 2019-10-30 DIAGNOSIS — M1990 Unspecified osteoarthritis, unspecified site: Secondary | ICD-10-CM | POA: Diagnosis not present

## 2019-10-30 DIAGNOSIS — E1140 Type 2 diabetes mellitus with diabetic neuropathy, unspecified: Secondary | ICD-10-CM | POA: Diagnosis not present

## 2019-10-30 DIAGNOSIS — K7460 Unspecified cirrhosis of liver: Secondary | ICD-10-CM | POA: Diagnosis not present

## 2019-10-30 DIAGNOSIS — N2581 Secondary hyperparathyroidism of renal origin: Secondary | ICD-10-CM | POA: Diagnosis not present

## 2019-10-30 DIAGNOSIS — I2510 Atherosclerotic heart disease of native coronary artery without angina pectoris: Secondary | ICD-10-CM | POA: Diagnosis not present

## 2019-10-30 DIAGNOSIS — I120 Hypertensive chronic kidney disease with stage 5 chronic kidney disease or end stage renal disease: Secondary | ICD-10-CM | POA: Diagnosis not present

## 2019-10-30 DIAGNOSIS — D509 Iron deficiency anemia, unspecified: Secondary | ICD-10-CM | POA: Diagnosis not present

## 2019-10-31 DIAGNOSIS — I69351 Hemiplegia and hemiparesis following cerebral infarction affecting right dominant side: Secondary | ICD-10-CM | POA: Diagnosis not present

## 2019-10-31 DIAGNOSIS — D631 Anemia in chronic kidney disease: Secondary | ICD-10-CM | POA: Diagnosis not present

## 2019-10-31 DIAGNOSIS — N186 End stage renal disease: Secondary | ICD-10-CM | POA: Diagnosis not present

## 2019-10-31 DIAGNOSIS — E1122 Type 2 diabetes mellitus with diabetic chronic kidney disease: Secondary | ICD-10-CM | POA: Diagnosis not present

## 2019-10-31 DIAGNOSIS — I2510 Atherosclerotic heart disease of native coronary artery without angina pectoris: Secondary | ICD-10-CM | POA: Diagnosis not present

## 2019-10-31 DIAGNOSIS — D509 Iron deficiency anemia, unspecified: Secondary | ICD-10-CM | POA: Diagnosis not present

## 2019-10-31 DIAGNOSIS — E1140 Type 2 diabetes mellitus with diabetic neuropathy, unspecified: Secondary | ICD-10-CM | POA: Diagnosis not present

## 2019-10-31 DIAGNOSIS — M1990 Unspecified osteoarthritis, unspecified site: Secondary | ICD-10-CM | POA: Diagnosis not present

## 2019-10-31 DIAGNOSIS — N2581 Secondary hyperparathyroidism of renal origin: Secondary | ICD-10-CM | POA: Diagnosis not present

## 2019-10-31 DIAGNOSIS — K7460 Unspecified cirrhosis of liver: Secondary | ICD-10-CM | POA: Diagnosis not present

## 2019-10-31 DIAGNOSIS — I120 Hypertensive chronic kidney disease with stage 5 chronic kidney disease or end stage renal disease: Secondary | ICD-10-CM | POA: Diagnosis not present

## 2019-11-01 DIAGNOSIS — D631 Anemia in chronic kidney disease: Secondary | ICD-10-CM | POA: Diagnosis not present

## 2019-11-01 DIAGNOSIS — N2581 Secondary hyperparathyroidism of renal origin: Secondary | ICD-10-CM | POA: Diagnosis not present

## 2019-11-01 DIAGNOSIS — N186 End stage renal disease: Secondary | ICD-10-CM | POA: Diagnosis not present

## 2019-11-02 DIAGNOSIS — N2581 Secondary hyperparathyroidism of renal origin: Secondary | ICD-10-CM | POA: Diagnosis not present

## 2019-11-02 DIAGNOSIS — N186 End stage renal disease: Secondary | ICD-10-CM | POA: Diagnosis not present

## 2019-11-02 DIAGNOSIS — D631 Anemia in chronic kidney disease: Secondary | ICD-10-CM | POA: Diagnosis not present

## 2019-11-03 DIAGNOSIS — N2581 Secondary hyperparathyroidism of renal origin: Secondary | ICD-10-CM | POA: Diagnosis not present

## 2019-11-03 DIAGNOSIS — K7460 Unspecified cirrhosis of liver: Secondary | ICD-10-CM | POA: Diagnosis not present

## 2019-11-03 DIAGNOSIS — E1122 Type 2 diabetes mellitus with diabetic chronic kidney disease: Secondary | ICD-10-CM | POA: Diagnosis not present

## 2019-11-03 DIAGNOSIS — D631 Anemia in chronic kidney disease: Secondary | ICD-10-CM | POA: Diagnosis not present

## 2019-11-03 DIAGNOSIS — E1140 Type 2 diabetes mellitus with diabetic neuropathy, unspecified: Secondary | ICD-10-CM | POA: Diagnosis not present

## 2019-11-03 DIAGNOSIS — N186 End stage renal disease: Secondary | ICD-10-CM | POA: Diagnosis not present

## 2019-11-03 DIAGNOSIS — I69351 Hemiplegia and hemiparesis following cerebral infarction affecting right dominant side: Secondary | ICD-10-CM | POA: Diagnosis not present

## 2019-11-03 DIAGNOSIS — I120 Hypertensive chronic kidney disease with stage 5 chronic kidney disease or end stage renal disease: Secondary | ICD-10-CM | POA: Diagnosis not present

## 2019-11-03 DIAGNOSIS — D509 Iron deficiency anemia, unspecified: Secondary | ICD-10-CM | POA: Diagnosis not present

## 2019-11-03 DIAGNOSIS — M1990 Unspecified osteoarthritis, unspecified site: Secondary | ICD-10-CM | POA: Diagnosis not present

## 2019-11-03 DIAGNOSIS — I2510 Atherosclerotic heart disease of native coronary artery without angina pectoris: Secondary | ICD-10-CM | POA: Diagnosis not present

## 2019-11-04 DIAGNOSIS — K7460 Unspecified cirrhosis of liver: Secondary | ICD-10-CM | POA: Diagnosis not present

## 2019-11-04 DIAGNOSIS — N186 End stage renal disease: Secondary | ICD-10-CM | POA: Diagnosis not present

## 2019-11-04 DIAGNOSIS — E1140 Type 2 diabetes mellitus with diabetic neuropathy, unspecified: Secondary | ICD-10-CM | POA: Diagnosis not present

## 2019-11-04 DIAGNOSIS — D631 Anemia in chronic kidney disease: Secondary | ICD-10-CM | POA: Diagnosis not present

## 2019-11-04 DIAGNOSIS — I120 Hypertensive chronic kidney disease with stage 5 chronic kidney disease or end stage renal disease: Secondary | ICD-10-CM | POA: Diagnosis not present

## 2019-11-04 DIAGNOSIS — E1122 Type 2 diabetes mellitus with diabetic chronic kidney disease: Secondary | ICD-10-CM | POA: Diagnosis not present

## 2019-11-04 DIAGNOSIS — D509 Iron deficiency anemia, unspecified: Secondary | ICD-10-CM | POA: Diagnosis not present

## 2019-11-04 DIAGNOSIS — M1990 Unspecified osteoarthritis, unspecified site: Secondary | ICD-10-CM | POA: Diagnosis not present

## 2019-11-04 DIAGNOSIS — N2581 Secondary hyperparathyroidism of renal origin: Secondary | ICD-10-CM | POA: Diagnosis not present

## 2019-11-04 DIAGNOSIS — I69351 Hemiplegia and hemiparesis following cerebral infarction affecting right dominant side: Secondary | ICD-10-CM | POA: Diagnosis not present

## 2019-11-04 DIAGNOSIS — I2510 Atherosclerotic heart disease of native coronary artery without angina pectoris: Secondary | ICD-10-CM | POA: Diagnosis not present

## 2019-11-05 DIAGNOSIS — D631 Anemia in chronic kidney disease: Secondary | ICD-10-CM | POA: Diagnosis not present

## 2019-11-05 DIAGNOSIS — N186 End stage renal disease: Secondary | ICD-10-CM | POA: Diagnosis not present

## 2019-11-05 DIAGNOSIS — N2581 Secondary hyperparathyroidism of renal origin: Secondary | ICD-10-CM | POA: Diagnosis not present

## 2019-11-06 DIAGNOSIS — K7460 Unspecified cirrhosis of liver: Secondary | ICD-10-CM | POA: Diagnosis not present

## 2019-11-06 DIAGNOSIS — N2581 Secondary hyperparathyroidism of renal origin: Secondary | ICD-10-CM | POA: Diagnosis not present

## 2019-11-06 DIAGNOSIS — N186 End stage renal disease: Secondary | ICD-10-CM | POA: Diagnosis not present

## 2019-11-06 DIAGNOSIS — D509 Iron deficiency anemia, unspecified: Secondary | ICD-10-CM | POA: Diagnosis not present

## 2019-11-06 DIAGNOSIS — D631 Anemia in chronic kidney disease: Secondary | ICD-10-CM | POA: Diagnosis not present

## 2019-11-06 DIAGNOSIS — I120 Hypertensive chronic kidney disease with stage 5 chronic kidney disease or end stage renal disease: Secondary | ICD-10-CM | POA: Diagnosis not present

## 2019-11-06 DIAGNOSIS — M1990 Unspecified osteoarthritis, unspecified site: Secondary | ICD-10-CM | POA: Diagnosis not present

## 2019-11-06 DIAGNOSIS — E1140 Type 2 diabetes mellitus with diabetic neuropathy, unspecified: Secondary | ICD-10-CM | POA: Diagnosis not present

## 2019-11-06 DIAGNOSIS — E1122 Type 2 diabetes mellitus with diabetic chronic kidney disease: Secondary | ICD-10-CM | POA: Diagnosis not present

## 2019-11-06 DIAGNOSIS — I69351 Hemiplegia and hemiparesis following cerebral infarction affecting right dominant side: Secondary | ICD-10-CM | POA: Diagnosis not present

## 2019-11-06 DIAGNOSIS — I2510 Atherosclerotic heart disease of native coronary artery without angina pectoris: Secondary | ICD-10-CM | POA: Diagnosis not present

## 2019-11-07 DIAGNOSIS — D631 Anemia in chronic kidney disease: Secondary | ICD-10-CM | POA: Diagnosis not present

## 2019-11-07 DIAGNOSIS — N186 End stage renal disease: Secondary | ICD-10-CM | POA: Diagnosis not present

## 2019-11-07 DIAGNOSIS — N2581 Secondary hyperparathyroidism of renal origin: Secondary | ICD-10-CM | POA: Diagnosis not present

## 2019-11-08 DIAGNOSIS — N2581 Secondary hyperparathyroidism of renal origin: Secondary | ICD-10-CM | POA: Diagnosis not present

## 2019-11-08 DIAGNOSIS — N186 End stage renal disease: Secondary | ICD-10-CM | POA: Diagnosis not present

## 2019-11-08 DIAGNOSIS — D631 Anemia in chronic kidney disease: Secondary | ICD-10-CM | POA: Diagnosis not present

## 2019-11-09 DIAGNOSIS — N2581 Secondary hyperparathyroidism of renal origin: Secondary | ICD-10-CM | POA: Diagnosis not present

## 2019-11-09 DIAGNOSIS — N186 End stage renal disease: Secondary | ICD-10-CM | POA: Diagnosis not present

## 2019-11-09 DIAGNOSIS — D631 Anemia in chronic kidney disease: Secondary | ICD-10-CM | POA: Diagnosis not present

## 2019-11-10 DIAGNOSIS — N2581 Secondary hyperparathyroidism of renal origin: Secondary | ICD-10-CM | POA: Diagnosis not present

## 2019-11-10 DIAGNOSIS — Z114 Encounter for screening for human immunodeficiency virus [HIV]: Secondary | ICD-10-CM | POA: Diagnosis not present

## 2019-11-10 DIAGNOSIS — N186 End stage renal disease: Secondary | ICD-10-CM | POA: Diagnosis not present

## 2019-11-10 DIAGNOSIS — D631 Anemia in chronic kidney disease: Secondary | ICD-10-CM | POA: Diagnosis not present

## 2019-11-10 DIAGNOSIS — E119 Type 2 diabetes mellitus without complications: Secondary | ICD-10-CM | POA: Diagnosis not present

## 2019-11-10 DIAGNOSIS — Z1159 Encounter for screening for other viral diseases: Secondary | ICD-10-CM | POA: Diagnosis not present

## 2019-11-11 DIAGNOSIS — N186 End stage renal disease: Secondary | ICD-10-CM | POA: Diagnosis not present

## 2019-11-11 DIAGNOSIS — D631 Anemia in chronic kidney disease: Secondary | ICD-10-CM | POA: Diagnosis not present

## 2019-11-11 DIAGNOSIS — N2581 Secondary hyperparathyroidism of renal origin: Secondary | ICD-10-CM | POA: Diagnosis not present

## 2019-11-12 DIAGNOSIS — N2581 Secondary hyperparathyroidism of renal origin: Secondary | ICD-10-CM | POA: Diagnosis not present

## 2019-11-12 DIAGNOSIS — N186 End stage renal disease: Secondary | ICD-10-CM | POA: Diagnosis not present

## 2019-11-12 DIAGNOSIS — D631 Anemia in chronic kidney disease: Secondary | ICD-10-CM | POA: Diagnosis not present

## 2019-11-13 DIAGNOSIS — N2581 Secondary hyperparathyroidism of renal origin: Secondary | ICD-10-CM | POA: Diagnosis not present

## 2019-11-13 DIAGNOSIS — D631 Anemia in chronic kidney disease: Secondary | ICD-10-CM | POA: Diagnosis not present

## 2019-11-13 DIAGNOSIS — N186 End stage renal disease: Secondary | ICD-10-CM | POA: Diagnosis not present

## 2019-11-14 DIAGNOSIS — N186 End stage renal disease: Secondary | ICD-10-CM | POA: Diagnosis not present

## 2019-11-14 DIAGNOSIS — N2581 Secondary hyperparathyroidism of renal origin: Secondary | ICD-10-CM | POA: Diagnosis not present

## 2019-11-14 DIAGNOSIS — D631 Anemia in chronic kidney disease: Secondary | ICD-10-CM | POA: Diagnosis not present

## 2019-11-15 DIAGNOSIS — N186 End stage renal disease: Secondary | ICD-10-CM | POA: Diagnosis not present

## 2019-11-15 DIAGNOSIS — D631 Anemia in chronic kidney disease: Secondary | ICD-10-CM | POA: Diagnosis not present

## 2019-11-15 DIAGNOSIS — N2581 Secondary hyperparathyroidism of renal origin: Secondary | ICD-10-CM | POA: Diagnosis not present

## 2019-11-16 DIAGNOSIS — N186 End stage renal disease: Secondary | ICD-10-CM | POA: Diagnosis not present

## 2019-11-16 DIAGNOSIS — N2581 Secondary hyperparathyroidism of renal origin: Secondary | ICD-10-CM | POA: Diagnosis not present

## 2019-11-16 DIAGNOSIS — D631 Anemia in chronic kidney disease: Secondary | ICD-10-CM | POA: Diagnosis not present

## 2019-11-17 DIAGNOSIS — Z8673 Personal history of transient ischemic attack (TIA), and cerebral infarction without residual deficits: Secondary | ICD-10-CM | POA: Diagnosis not present

## 2019-11-17 DIAGNOSIS — I120 Hypertensive chronic kidney disease with stage 5 chronic kidney disease or end stage renal disease: Secondary | ICD-10-CM | POA: Diagnosis not present

## 2019-11-17 DIAGNOSIS — N2581 Secondary hyperparathyroidism of renal origin: Secondary | ICD-10-CM | POA: Diagnosis not present

## 2019-11-17 DIAGNOSIS — Z8639 Personal history of other endocrine, nutritional and metabolic disease: Secondary | ICD-10-CM | POA: Diagnosis not present

## 2019-11-17 DIAGNOSIS — Z992 Dependence on renal dialysis: Secondary | ICD-10-CM | POA: Diagnosis not present

## 2019-11-17 DIAGNOSIS — N186 End stage renal disease: Secondary | ICD-10-CM | POA: Diagnosis not present

## 2019-11-17 DIAGNOSIS — D631 Anemia in chronic kidney disease: Secondary | ICD-10-CM | POA: Diagnosis not present

## 2019-11-18 DIAGNOSIS — D631 Anemia in chronic kidney disease: Secondary | ICD-10-CM | POA: Diagnosis not present

## 2019-11-18 DIAGNOSIS — N186 End stage renal disease: Secondary | ICD-10-CM | POA: Diagnosis not present

## 2019-11-18 DIAGNOSIS — N2581 Secondary hyperparathyroidism of renal origin: Secondary | ICD-10-CM | POA: Diagnosis not present

## 2019-11-19 DIAGNOSIS — N186 End stage renal disease: Secondary | ICD-10-CM | POA: Diagnosis not present

## 2019-11-19 DIAGNOSIS — N2581 Secondary hyperparathyroidism of renal origin: Secondary | ICD-10-CM | POA: Diagnosis not present

## 2019-11-19 DIAGNOSIS — D631 Anemia in chronic kidney disease: Secondary | ICD-10-CM | POA: Diagnosis not present

## 2019-11-20 DIAGNOSIS — N186 End stage renal disease: Secondary | ICD-10-CM | POA: Diagnosis not present

## 2019-11-20 DIAGNOSIS — N2581 Secondary hyperparathyroidism of renal origin: Secondary | ICD-10-CM | POA: Diagnosis not present

## 2019-11-20 DIAGNOSIS — D631 Anemia in chronic kidney disease: Secondary | ICD-10-CM | POA: Diagnosis not present

## 2019-11-21 DIAGNOSIS — D631 Anemia in chronic kidney disease: Secondary | ICD-10-CM | POA: Diagnosis not present

## 2019-11-21 DIAGNOSIS — N2581 Secondary hyperparathyroidism of renal origin: Secondary | ICD-10-CM | POA: Diagnosis not present

## 2019-11-21 DIAGNOSIS — N186 End stage renal disease: Secondary | ICD-10-CM | POA: Diagnosis not present

## 2019-11-22 DIAGNOSIS — N2581 Secondary hyperparathyroidism of renal origin: Secondary | ICD-10-CM | POA: Diagnosis not present

## 2019-11-22 DIAGNOSIS — D631 Anemia in chronic kidney disease: Secondary | ICD-10-CM | POA: Diagnosis not present

## 2019-11-22 DIAGNOSIS — N186 End stage renal disease: Secondary | ICD-10-CM | POA: Diagnosis not present

## 2019-11-23 DIAGNOSIS — D631 Anemia in chronic kidney disease: Secondary | ICD-10-CM | POA: Diagnosis not present

## 2019-11-23 DIAGNOSIS — N186 End stage renal disease: Secondary | ICD-10-CM | POA: Diagnosis not present

## 2019-11-23 DIAGNOSIS — N2581 Secondary hyperparathyroidism of renal origin: Secondary | ICD-10-CM | POA: Diagnosis not present

## 2019-11-24 DIAGNOSIS — Z992 Dependence on renal dialysis: Secondary | ICD-10-CM | POA: Diagnosis not present

## 2019-11-24 DIAGNOSIS — D631 Anemia in chronic kidney disease: Secondary | ICD-10-CM | POA: Diagnosis not present

## 2019-11-24 DIAGNOSIS — N2581 Secondary hyperparathyroidism of renal origin: Secondary | ICD-10-CM | POA: Diagnosis not present

## 2019-11-24 DIAGNOSIS — N186 End stage renal disease: Secondary | ICD-10-CM | POA: Diagnosis not present

## 2019-11-25 DIAGNOSIS — D631 Anemia in chronic kidney disease: Secondary | ICD-10-CM | POA: Diagnosis not present

## 2019-11-25 DIAGNOSIS — N2581 Secondary hyperparathyroidism of renal origin: Secondary | ICD-10-CM | POA: Diagnosis not present

## 2019-11-25 DIAGNOSIS — N186 End stage renal disease: Secondary | ICD-10-CM | POA: Diagnosis not present

## 2019-11-26 DIAGNOSIS — D631 Anemia in chronic kidney disease: Secondary | ICD-10-CM | POA: Diagnosis not present

## 2019-11-26 DIAGNOSIS — N186 End stage renal disease: Secondary | ICD-10-CM | POA: Diagnosis not present

## 2019-11-26 DIAGNOSIS — N2581 Secondary hyperparathyroidism of renal origin: Secondary | ICD-10-CM | POA: Diagnosis not present

## 2019-11-27 DIAGNOSIS — D631 Anemia in chronic kidney disease: Secondary | ICD-10-CM | POA: Diagnosis not present

## 2019-11-27 DIAGNOSIS — E119 Type 2 diabetes mellitus without complications: Secondary | ICD-10-CM | POA: Diagnosis not present

## 2019-11-27 DIAGNOSIS — N186 End stage renal disease: Secondary | ICD-10-CM | POA: Diagnosis not present

## 2019-11-27 DIAGNOSIS — N2581 Secondary hyperparathyroidism of renal origin: Secondary | ICD-10-CM | POA: Diagnosis not present

## 2019-11-28 DIAGNOSIS — D631 Anemia in chronic kidney disease: Secondary | ICD-10-CM | POA: Diagnosis not present

## 2019-11-28 DIAGNOSIS — N186 End stage renal disease: Secondary | ICD-10-CM | POA: Diagnosis not present

## 2019-11-28 DIAGNOSIS — N2581 Secondary hyperparathyroidism of renal origin: Secondary | ICD-10-CM | POA: Diagnosis not present

## 2019-11-29 DIAGNOSIS — N2581 Secondary hyperparathyroidism of renal origin: Secondary | ICD-10-CM | POA: Diagnosis not present

## 2019-11-29 DIAGNOSIS — D631 Anemia in chronic kidney disease: Secondary | ICD-10-CM | POA: Diagnosis not present

## 2019-11-29 DIAGNOSIS — N186 End stage renal disease: Secondary | ICD-10-CM | POA: Diagnosis not present

## 2019-11-30 DIAGNOSIS — D631 Anemia in chronic kidney disease: Secondary | ICD-10-CM | POA: Diagnosis not present

## 2019-11-30 DIAGNOSIS — N186 End stage renal disease: Secondary | ICD-10-CM | POA: Diagnosis not present

## 2019-11-30 DIAGNOSIS — N2581 Secondary hyperparathyroidism of renal origin: Secondary | ICD-10-CM | POA: Diagnosis not present

## 2019-12-01 DIAGNOSIS — D631 Anemia in chronic kidney disease: Secondary | ICD-10-CM | POA: Diagnosis not present

## 2019-12-01 DIAGNOSIS — N2581 Secondary hyperparathyroidism of renal origin: Secondary | ICD-10-CM | POA: Diagnosis not present

## 2019-12-01 DIAGNOSIS — N186 End stage renal disease: Secondary | ICD-10-CM | POA: Diagnosis not present

## 2019-12-02 DIAGNOSIS — D631 Anemia in chronic kidney disease: Secondary | ICD-10-CM | POA: Diagnosis not present

## 2019-12-02 DIAGNOSIS — N2581 Secondary hyperparathyroidism of renal origin: Secondary | ICD-10-CM | POA: Diagnosis not present

## 2019-12-02 DIAGNOSIS — N186 End stage renal disease: Secondary | ICD-10-CM | POA: Diagnosis not present

## 2019-12-03 DIAGNOSIS — N2581 Secondary hyperparathyroidism of renal origin: Secondary | ICD-10-CM | POA: Diagnosis not present

## 2019-12-03 DIAGNOSIS — N186 End stage renal disease: Secondary | ICD-10-CM | POA: Diagnosis not present

## 2019-12-03 DIAGNOSIS — D631 Anemia in chronic kidney disease: Secondary | ICD-10-CM | POA: Diagnosis not present

## 2019-12-04 DIAGNOSIS — D631 Anemia in chronic kidney disease: Secondary | ICD-10-CM | POA: Diagnosis not present

## 2019-12-04 DIAGNOSIS — N186 End stage renal disease: Secondary | ICD-10-CM | POA: Diagnosis not present

## 2019-12-04 DIAGNOSIS — N2581 Secondary hyperparathyroidism of renal origin: Secondary | ICD-10-CM | POA: Diagnosis not present

## 2019-12-05 DIAGNOSIS — D631 Anemia in chronic kidney disease: Secondary | ICD-10-CM | POA: Diagnosis not present

## 2019-12-05 DIAGNOSIS — N2581 Secondary hyperparathyroidism of renal origin: Secondary | ICD-10-CM | POA: Diagnosis not present

## 2019-12-05 DIAGNOSIS — N186 End stage renal disease: Secondary | ICD-10-CM | POA: Diagnosis not present

## 2019-12-06 DIAGNOSIS — N186 End stage renal disease: Secondary | ICD-10-CM | POA: Diagnosis not present

## 2019-12-06 DIAGNOSIS — N2581 Secondary hyperparathyroidism of renal origin: Secondary | ICD-10-CM | POA: Diagnosis not present

## 2019-12-06 DIAGNOSIS — D631 Anemia in chronic kidney disease: Secondary | ICD-10-CM | POA: Diagnosis not present

## 2019-12-07 DIAGNOSIS — N186 End stage renal disease: Secondary | ICD-10-CM | POA: Diagnosis not present

## 2019-12-07 DIAGNOSIS — D631 Anemia in chronic kidney disease: Secondary | ICD-10-CM | POA: Diagnosis not present

## 2019-12-07 DIAGNOSIS — N2581 Secondary hyperparathyroidism of renal origin: Secondary | ICD-10-CM | POA: Diagnosis not present

## 2019-12-08 DIAGNOSIS — N186 End stage renal disease: Secondary | ICD-10-CM | POA: Diagnosis not present

## 2019-12-08 DIAGNOSIS — D631 Anemia in chronic kidney disease: Secondary | ICD-10-CM | POA: Diagnosis not present

## 2019-12-08 DIAGNOSIS — N2581 Secondary hyperparathyroidism of renal origin: Secondary | ICD-10-CM | POA: Diagnosis not present

## 2019-12-09 DIAGNOSIS — N186 End stage renal disease: Secondary | ICD-10-CM | POA: Diagnosis not present

## 2019-12-09 DIAGNOSIS — D631 Anemia in chronic kidney disease: Secondary | ICD-10-CM | POA: Diagnosis not present

## 2019-12-09 DIAGNOSIS — N2581 Secondary hyperparathyroidism of renal origin: Secondary | ICD-10-CM | POA: Diagnosis not present

## 2019-12-10 DIAGNOSIS — N2581 Secondary hyperparathyroidism of renal origin: Secondary | ICD-10-CM | POA: Diagnosis not present

## 2019-12-10 DIAGNOSIS — N186 End stage renal disease: Secondary | ICD-10-CM | POA: Diagnosis not present

## 2019-12-10 DIAGNOSIS — D631 Anemia in chronic kidney disease: Secondary | ICD-10-CM | POA: Diagnosis not present

## 2019-12-11 DIAGNOSIS — D631 Anemia in chronic kidney disease: Secondary | ICD-10-CM | POA: Diagnosis not present

## 2019-12-11 DIAGNOSIS — N2581 Secondary hyperparathyroidism of renal origin: Secondary | ICD-10-CM | POA: Diagnosis not present

## 2019-12-11 DIAGNOSIS — N186 End stage renal disease: Secondary | ICD-10-CM | POA: Diagnosis not present

## 2019-12-12 DIAGNOSIS — Z4932 Encounter for adequacy testing for peritoneal dialysis: Secondary | ICD-10-CM | POA: Diagnosis not present

## 2019-12-12 DIAGNOSIS — N186 End stage renal disease: Secondary | ICD-10-CM | POA: Diagnosis not present

## 2019-12-12 DIAGNOSIS — N2581 Secondary hyperparathyroidism of renal origin: Secondary | ICD-10-CM | POA: Diagnosis not present

## 2019-12-12 DIAGNOSIS — D631 Anemia in chronic kidney disease: Secondary | ICD-10-CM | POA: Diagnosis not present

## 2019-12-13 DIAGNOSIS — N186 End stage renal disease: Secondary | ICD-10-CM | POA: Diagnosis not present

## 2019-12-13 DIAGNOSIS — D631 Anemia in chronic kidney disease: Secondary | ICD-10-CM | POA: Diagnosis not present

## 2019-12-13 DIAGNOSIS — N2581 Secondary hyperparathyroidism of renal origin: Secondary | ICD-10-CM | POA: Diagnosis not present

## 2019-12-14 DIAGNOSIS — N2581 Secondary hyperparathyroidism of renal origin: Secondary | ICD-10-CM | POA: Diagnosis not present

## 2019-12-14 DIAGNOSIS — D631 Anemia in chronic kidney disease: Secondary | ICD-10-CM | POA: Diagnosis not present

## 2019-12-14 DIAGNOSIS — N186 End stage renal disease: Secondary | ICD-10-CM | POA: Diagnosis not present

## 2019-12-15 DIAGNOSIS — D631 Anemia in chronic kidney disease: Secondary | ICD-10-CM | POA: Diagnosis not present

## 2019-12-15 DIAGNOSIS — N186 End stage renal disease: Secondary | ICD-10-CM | POA: Diagnosis not present

## 2019-12-15 DIAGNOSIS — N2581 Secondary hyperparathyroidism of renal origin: Secondary | ICD-10-CM | POA: Diagnosis not present

## 2019-12-16 ENCOUNTER — Other Ambulatory Visit: Payer: Self-pay

## 2019-12-16 DIAGNOSIS — D631 Anemia in chronic kidney disease: Secondary | ICD-10-CM | POA: Diagnosis not present

## 2019-12-16 DIAGNOSIS — N186 End stage renal disease: Secondary | ICD-10-CM | POA: Diagnosis not present

## 2019-12-16 DIAGNOSIS — N2581 Secondary hyperparathyroidism of renal origin: Secondary | ICD-10-CM | POA: Diagnosis not present

## 2019-12-16 MED ORDER — OMEPRAZOLE 40 MG PO CPDR: 40.0000 mg | DELAYED_RELEASE_CAPSULE | Freq: Every day | ORAL | 3 refills | Status: DC

## 2019-12-17 ENCOUNTER — Other Ambulatory Visit: Payer: Self-pay

## 2019-12-17 DIAGNOSIS — N186 End stage renal disease: Secondary | ICD-10-CM | POA: Diagnosis not present

## 2019-12-17 DIAGNOSIS — D631 Anemia in chronic kidney disease: Secondary | ICD-10-CM | POA: Diagnosis not present

## 2019-12-17 DIAGNOSIS — N2581 Secondary hyperparathyroidism of renal origin: Secondary | ICD-10-CM | POA: Diagnosis not present

## 2019-12-17 MED ORDER — OMEPRAZOLE 40 MG PO CPDR: 40.0000 mg | DELAYED_RELEASE_CAPSULE | Freq: Every day | ORAL | 3 refills | Status: DC

## 2019-12-18 DIAGNOSIS — N2581 Secondary hyperparathyroidism of renal origin: Secondary | ICD-10-CM | POA: Diagnosis not present

## 2019-12-18 DIAGNOSIS — N186 End stage renal disease: Secondary | ICD-10-CM | POA: Diagnosis not present

## 2019-12-18 DIAGNOSIS — D631 Anemia in chronic kidney disease: Secondary | ICD-10-CM | POA: Diagnosis not present

## 2019-12-19 DIAGNOSIS — N2581 Secondary hyperparathyroidism of renal origin: Secondary | ICD-10-CM | POA: Diagnosis not present

## 2019-12-19 DIAGNOSIS — N186 End stage renal disease: Secondary | ICD-10-CM | POA: Diagnosis not present

## 2019-12-19 DIAGNOSIS — D631 Anemia in chronic kidney disease: Secondary | ICD-10-CM | POA: Diagnosis not present

## 2019-12-20 DIAGNOSIS — N2581 Secondary hyperparathyroidism of renal origin: Secondary | ICD-10-CM | POA: Diagnosis not present

## 2019-12-20 DIAGNOSIS — D631 Anemia in chronic kidney disease: Secondary | ICD-10-CM | POA: Diagnosis not present

## 2019-12-20 DIAGNOSIS — N186 End stage renal disease: Secondary | ICD-10-CM | POA: Diagnosis not present

## 2019-12-21 DIAGNOSIS — D631 Anemia in chronic kidney disease: Secondary | ICD-10-CM | POA: Diagnosis not present

## 2019-12-21 DIAGNOSIS — N186 End stage renal disease: Secondary | ICD-10-CM | POA: Diagnosis not present

## 2019-12-21 DIAGNOSIS — N2581 Secondary hyperparathyroidism of renal origin: Secondary | ICD-10-CM | POA: Diagnosis not present

## 2019-12-22 DIAGNOSIS — D631 Anemia in chronic kidney disease: Secondary | ICD-10-CM | POA: Diagnosis not present

## 2019-12-22 DIAGNOSIS — N2581 Secondary hyperparathyroidism of renal origin: Secondary | ICD-10-CM | POA: Diagnosis not present

## 2019-12-22 DIAGNOSIS — N186 End stage renal disease: Secondary | ICD-10-CM | POA: Diagnosis not present

## 2019-12-23 DIAGNOSIS — D631 Anemia in chronic kidney disease: Secondary | ICD-10-CM | POA: Diagnosis not present

## 2019-12-23 DIAGNOSIS — N2581 Secondary hyperparathyroidism of renal origin: Secondary | ICD-10-CM | POA: Diagnosis not present

## 2019-12-23 DIAGNOSIS — N186 End stage renal disease: Secondary | ICD-10-CM | POA: Diagnosis not present

## 2019-12-24 ENCOUNTER — Other Ambulatory Visit: Payer: Self-pay

## 2019-12-24 ENCOUNTER — Ambulatory Visit (INDEPENDENT_AMBULATORY_CARE_PROVIDER_SITE_OTHER): Payer: Medicare HMO | Admitting: Internal Medicine

## 2019-12-24 ENCOUNTER — Encounter: Payer: Self-pay | Admitting: Internal Medicine

## 2019-12-24 DIAGNOSIS — Z992 Dependence on renal dialysis: Secondary | ICD-10-CM | POA: Diagnosis not present

## 2019-12-24 DIAGNOSIS — L97521 Non-pressure chronic ulcer of other part of left foot limited to breakdown of skin: Secondary | ICD-10-CM | POA: Diagnosis not present

## 2019-12-24 DIAGNOSIS — N185 Chronic kidney disease, stage 5: Secondary | ICD-10-CM | POA: Diagnosis not present

## 2019-12-24 DIAGNOSIS — L97509 Non-pressure chronic ulcer of other part of unspecified foot with unspecified severity: Secondary | ICD-10-CM | POA: Insufficient documentation

## 2019-12-24 DIAGNOSIS — S93409A Sprain of unspecified ligament of unspecified ankle, initial encounter: Secondary | ICD-10-CM | POA: Insufficient documentation

## 2019-12-24 DIAGNOSIS — S93401A Sprain of unspecified ligament of right ankle, initial encounter: Secondary | ICD-10-CM | POA: Diagnosis not present

## 2019-12-24 DIAGNOSIS — N186 End stage renal disease: Secondary | ICD-10-CM | POA: Diagnosis not present

## 2019-12-24 DIAGNOSIS — E118 Type 2 diabetes mellitus with unspecified complications: Secondary | ICD-10-CM | POA: Diagnosis not present

## 2019-12-24 DIAGNOSIS — D631 Anemia in chronic kidney disease: Secondary | ICD-10-CM | POA: Diagnosis not present

## 2019-12-24 DIAGNOSIS — N2581 Secondary hyperparathyroidism of renal origin: Secondary | ICD-10-CM | POA: Diagnosis not present

## 2019-12-24 MED ORDER — MUPIROCIN 2 % EX OINT: TOPICAL_OINTMENT | CUTANEOUS | 1 refills | Status: DC

## 2019-12-24 MED ORDER — FREESTYLE LIBRE 14 DAY SENSOR MISC: 1.0000 [IU] | 3 refills | Status: DC

## 2019-12-24 MED ORDER — FREESTYLE LIBRE 14 DAY READER DEVI: 1.0000 [IU] | Freq: Every day | 1 refills | Status: DC

## 2019-12-24 NOTE — Assessment & Plan Note (Signed)
ACE wrap

## 2019-12-24 NOTE — Assessment & Plan Note (Addendum)
L dorsal foot blister/wound 6x3 cm    The patient was placed in a decubitus position. The area of an ulcer was prepped with povidone- iodine. Necrotic tissues were partially debrided with scissors and forceps.    Bactroban Rx

## 2019-12-24 NOTE — Progress Notes (Signed)
Subjective:  Patient ID: Mary Valencia, female    DOB: [birth year]  Age: 64 y.o. MRN: [id number]  CC: No chief complaint on file.   HPI Mary Valencia presents for CAD, HTN, DM, ESRD f/u  C/o L foot fire ant bite last week C/o twisting her R ankle   Outpatient Medications Prior to Visit  Medication Sig Dispense Refill  . ACCU-CHEK FASTCLIX LANCETS MISC Use to check blood glucose TID (DX: E11.8, Z79.4) 300 each 3  . Alcohol Swabs (B-D SINGLE USE SWABS REGULAR) PADS Use as directed 100 each 3  . atorvastatin (LIPITOR) 40 MG tablet Take 1 tablet (40 mg total) by mouth daily. 90 tablet 3  . Blood Glucose Calibration (ACCU-CHEK SMARTVIEW CONTROL) LIQD Use as directed 1 each 1  . Blood Glucose Monitoring Suppl (ACCU-CHEK NANO SMARTVIEW) w/Device KIT Use to check blood glucose TID (DX: E11.8, Z79.4) 1 kit 0  . calcitRIOL (ROCALTROL) 0.25 MCG capsule Take 0.25 mcg by mouth daily.    . calcium acetate, Phos Binder, (PHOSLYRA) 667 MG/5ML SOLN Take 667 mg by mouth 3 (three) times daily with meals.    . calcium carbonate (TUMS - DOSED IN MG ELEMENTAL CALCIUM) 500 MG chewable tablet     . CALCIUM-MAGNESIUM-VITAMIN D PO Take 1 tablet by mouth daily.    . cinacalcet (SENSIPAR) 30 MG tablet Take 30 mg by mouth daily.    . diphenhydrAMINE (BENADRYL) 25 mg capsule Take 25 mg by mouth as needed.    Marland Kitchen glucose blood test strip TEST BLOOD GLUCOSE THREE TIMES DAILY 300 each 3  . hydrALAZINE (APRESOLINE) 100 MG tablet Take 1 tablet (100 mg total) by mouth 3 (three) times daily. 270 tablet 3  . losartan (COZAAR) 50 MG tablet Take 1 tablet (50 mg total) by mouth daily. 90 tablet 3  . NIFEdipine (ADALAT CC) 60 MG 24 hr tablet     . NIFEdipine (PROCARDIA XL/NIFEDICAL XL) 60 MG 24 hr tablet Take 1 tablet (60 mg total) by mouth 2 (two) times daily. 180 tablet 3  . NOVOLOG 100 UNIT/ML injection     . omeprazole (PRILOSEC) 40 MG capsule Take 1 capsule (40 mg total) by mouth daily. 90 capsule 3  . polyethylene  glycol (MIRALAX / GLYCOLAX) packet Take 17 g by mouth.    Marland Kitchen PRESCRIPTION MEDICATION 210 mg 3 (three) times daily before meals.     . senna-docusate (SENOKOT-S) 8.6-50 MG tablet Take by mouth 2 (two) times daily. Take 2 tab twice daily.    Marland Kitchen torsemide (DEMADEX) 20 MG tablet Take 40 mg by mouth.      No facility-administered medications prior to visit.    ROS: Review of Systems  Constitutional: Positive for fatigue. Negative for activity change, appetite change, chills and unexpected weight change.  HENT: Negative for congestion, mouth sores and sinus pressure.   Eyes: Negative for visual disturbance.  Respiratory: Negative for cough and chest tightness.   Gastrointestinal: Negative for abdominal pain and nausea.  Genitourinary: Negative for difficulty urinating, frequency and vaginal pain.  Musculoskeletal: Positive for arthralgias. Negative for back pain and gait problem.  Skin: Negative for pallor and rash.  Neurological: Negative for dizziness, tremors, weakness, numbness and headaches.  Psychiatric/Behavioral: Negative for confusion and sleep disturbance.    Objective:  There were no vitals taken for this visit.  BP Readings from Last 3 Encounters:  09/22/19 (!) 144/80  03/04/19 (!) 144/72  02/25/19 132/80    Wt Readings from Last 3 Encounters:  09/22/19 141 lb (64 kg)  03/04/19 152 lb (68.9 kg)  02/25/19 156 lb (70.8 kg)    Physical Exam Constitutional:      General: She is not in acute distress.    Appearance: She is well-developed. She is obese.  HENT:     Head: Normocephalic.     Right Ear: External ear normal.     Left Ear: External ear normal.     Nose: Nose normal.  Eyes:     General:        Right eye: No discharge.        Left eye: No discharge.     Conjunctiva/sclera: Conjunctivae normal.     Pupils: Pupils are equal, round, and reactive to light.  Neck:     Thyroid: No thyromegaly.     Vascular: No JVD.     Trachea: No tracheal deviation.    Cardiovascular:     Rate and Rhythm: Normal rate and regular rhythm.     Heart sounds: Normal heart sounds.  Pulmonary:     Effort: No respiratory distress.     Breath sounds: No stridor. No wheezing.  Abdominal:     General: Bowel sounds are normal. There is no distension.     Palpations: Abdomen is soft. There is no mass.     Tenderness: There is no abdominal tenderness. There is no guarding or rebound.  Musculoskeletal:        General: No tenderness.     Cervical back: Normal range of motion and neck supple.  Lymphadenopathy:     Cervical: No cervical adenopathy.  Skin:    Findings: No erythema or rash.  Neurological:     Mental Status: She is oriented to person, place, and time.     Cranial Nerves: No cranial nerve deficit.     Motor: No abnormal muscle tone.     Coordination: Coordination normal.     Deep Tendon Reflexes: Reflexes normal.  Psychiatric:        Behavior: Behavior normal.        Thought Content: Thought content normal.        Judgment: Judgment normal.   R ankle w/pain and swelling w/ROM L dorsal foot blister/wound 6x3 cm  Procedure note:  Ulcer debridement   Indication : skin ulcer   Risks including a need for repeat procedure, scar, infection and others as well as benefits were explained to the patient in detail. Verbal consent was obtained and signed.    The patient was placed in a decubitus position. The area of an ulcer was prepped with povidone- iodine. Necrotic tissues were partially debrided with scissors and forceps.    The wound was dressed with antibiotic ointment and Telfa pad. [place]. Tolerated well. Complications: None.   Wound instructions provided.   Wound instructions : change dressing once a day or twice a day is needed. Change dressing after  shower in the morning.  Pat dry the wound with gauze. Re-dress wound with antibiotic ointment and Telfa pad or a Band-Aid of appropriate size.   Please contact us if you notice collection of  pus or  fever and chills, increased pain or redness, increased swelling in the area.     Lab Results  Component Value Date   WBC 7.7 08/29/2017   HGB 29.1 09/01/2017   HCT 30 (A) 08/29/2017   PLT 203 08/29/2017   GLUCOSE 279 (H) 09/21/2017   CHOL 161 09/21/2017   TRIG 72.0 09/21/2017   HDL 63.60  09/21/2017   LDLDIRECT 207.8 09/29/2010   LDLCALC 83 09/21/2017   ALT 6 09/21/2017   AST 21 09/21/2017   NA 141 09/21/2017   K 4.9 09/21/2017   CL 98 09/21/2017   CREATININE 13.49 (HH) 09/21/2017   BUN 66 (H) 09/21/2017   CO2 27 09/21/2017   TSH 2.81 09/21/2017   HGBA1C 7.1 (H) 09/21/2017    [place] LT LOWER EXTREM LTD SOFT TISSUE NON VASCULAR  Result Date: 08/30/2017 CLINICAL DATA:  Ganglion cyst. Lump on the LEFT LATERAL LOWER leg for 7 weeks. Masses occasionally tender and varies in size. EXAM: ULTRASOUND LEFT LOWER EXTREMITY LIMITED TECHNIQUE: Ultrasound examination of the lower extremity soft tissues was performed in the area of clinical concern. COMPARISON:  None. FINDINGS: Soft Tissue Structures: There is mild edema within the subcutaneous tissue in the area of patient's concern. No mass or fluid collection. No cystic abnormality. IMPRESSION: Mild soft tissue edema.  No mass. Electronically Signed   By: Nolon Nations M.D.   On: 08/30/2017 17:03    Assessment & Plan:   There are no diagnoses linked to this encounter.   No orders of the defined types were placed in this encounter.    Follow-up: No follow-ups on file.  Walker Kehr, MD

## 2019-12-24 NOTE — Assessment & Plan Note (Signed)
Pt refused labs; monthly labs w/Nephrology incl A1c Given Rx for a new meter/reciever - Libra (fingers hurt)

## 2019-12-24 NOTE — Assessment & Plan Note (Signed)
Pt refused labs; monthly labs w/Nephrology

## 2019-12-25 DIAGNOSIS — N2581 Secondary hyperparathyroidism of renal origin: Secondary | ICD-10-CM | POA: Diagnosis not present

## 2019-12-25 DIAGNOSIS — D631 Anemia in chronic kidney disease: Secondary | ICD-10-CM | POA: Diagnosis not present

## 2019-12-25 DIAGNOSIS — N186 End stage renal disease: Secondary | ICD-10-CM | POA: Diagnosis not present

## 2019-12-25 DIAGNOSIS — D509 Iron deficiency anemia, unspecified: Secondary | ICD-10-CM | POA: Diagnosis not present

## 2019-12-26 DIAGNOSIS — N186 End stage renal disease: Secondary | ICD-10-CM | POA: Diagnosis not present

## 2019-12-26 DIAGNOSIS — D631 Anemia in chronic kidney disease: Secondary | ICD-10-CM | POA: Diagnosis not present

## 2019-12-26 DIAGNOSIS — N2581 Secondary hyperparathyroidism of renal origin: Secondary | ICD-10-CM | POA: Diagnosis not present

## 2019-12-26 DIAGNOSIS — D509 Iron deficiency anemia, unspecified: Secondary | ICD-10-CM | POA: Diagnosis not present

## 2019-12-27 DIAGNOSIS — N186 End stage renal disease: Secondary | ICD-10-CM | POA: Diagnosis not present

## 2019-12-27 DIAGNOSIS — N2581 Secondary hyperparathyroidism of renal origin: Secondary | ICD-10-CM | POA: Diagnosis not present

## 2019-12-27 DIAGNOSIS — D631 Anemia in chronic kidney disease: Secondary | ICD-10-CM | POA: Diagnosis not present

## 2019-12-27 DIAGNOSIS — D509 Iron deficiency anemia, unspecified: Secondary | ICD-10-CM | POA: Diagnosis not present

## 2019-12-28 DIAGNOSIS — D509 Iron deficiency anemia, unspecified: Secondary | ICD-10-CM | POA: Diagnosis not present

## 2019-12-28 DIAGNOSIS — N186 End stage renal disease: Secondary | ICD-10-CM | POA: Diagnosis not present

## 2019-12-28 DIAGNOSIS — N2581 Secondary hyperparathyroidism of renal origin: Secondary | ICD-10-CM | POA: Diagnosis not present

## 2019-12-28 DIAGNOSIS — D631 Anemia in chronic kidney disease: Secondary | ICD-10-CM | POA: Diagnosis not present

## 2019-12-29 DIAGNOSIS — N186 End stage renal disease: Secondary | ICD-10-CM | POA: Diagnosis not present

## 2019-12-29 DIAGNOSIS — D509 Iron deficiency anemia, unspecified: Secondary | ICD-10-CM | POA: Diagnosis not present

## 2019-12-29 DIAGNOSIS — N2581 Secondary hyperparathyroidism of renal origin: Secondary | ICD-10-CM | POA: Diagnosis not present

## 2019-12-29 DIAGNOSIS — D631 Anemia in chronic kidney disease: Secondary | ICD-10-CM | POA: Diagnosis not present

## 2019-12-30 DIAGNOSIS — D509 Iron deficiency anemia, unspecified: Secondary | ICD-10-CM | POA: Diagnosis not present

## 2019-12-30 DIAGNOSIS — D631 Anemia in chronic kidney disease: Secondary | ICD-10-CM | POA: Diagnosis not present

## 2019-12-30 DIAGNOSIS — N186 End stage renal disease: Secondary | ICD-10-CM | POA: Diagnosis not present

## 2019-12-30 DIAGNOSIS — N2581 Secondary hyperparathyroidism of renal origin: Secondary | ICD-10-CM | POA: Diagnosis not present

## 2019-12-31 DIAGNOSIS — D509 Iron deficiency anemia, unspecified: Secondary | ICD-10-CM | POA: Diagnosis not present

## 2019-12-31 DIAGNOSIS — N2581 Secondary hyperparathyroidism of renal origin: Secondary | ICD-10-CM | POA: Diagnosis not present

## 2019-12-31 DIAGNOSIS — D631 Anemia in chronic kidney disease: Secondary | ICD-10-CM | POA: Diagnosis not present

## 2019-12-31 DIAGNOSIS — E119 Type 2 diabetes mellitus without complications: Secondary | ICD-10-CM | POA: Diagnosis not present

## 2019-12-31 DIAGNOSIS — N186 End stage renal disease: Secondary | ICD-10-CM | POA: Diagnosis not present

## 2020-01-01 DIAGNOSIS — N186 End stage renal disease: Secondary | ICD-10-CM | POA: Diagnosis not present

## 2020-01-01 DIAGNOSIS — D509 Iron deficiency anemia, unspecified: Secondary | ICD-10-CM | POA: Diagnosis not present

## 2020-01-01 DIAGNOSIS — N2581 Secondary hyperparathyroidism of renal origin: Secondary | ICD-10-CM | POA: Diagnosis not present

## 2020-01-01 DIAGNOSIS — D631 Anemia in chronic kidney disease: Secondary | ICD-10-CM | POA: Diagnosis not present

## 2020-01-02 DIAGNOSIS — N2581 Secondary hyperparathyroidism of renal origin: Secondary | ICD-10-CM | POA: Diagnosis not present

## 2020-01-02 DIAGNOSIS — D631 Anemia in chronic kidney disease: Secondary | ICD-10-CM | POA: Diagnosis not present

## 2020-01-02 DIAGNOSIS — D509 Iron deficiency anemia, unspecified: Secondary | ICD-10-CM | POA: Diagnosis not present

## 2020-01-02 DIAGNOSIS — N186 End stage renal disease: Secondary | ICD-10-CM | POA: Diagnosis not present

## 2020-01-03 DIAGNOSIS — D631 Anemia in chronic kidney disease: Secondary | ICD-10-CM | POA: Diagnosis not present

## 2020-01-03 DIAGNOSIS — D509 Iron deficiency anemia, unspecified: Secondary | ICD-10-CM | POA: Diagnosis not present

## 2020-01-03 DIAGNOSIS — N186 End stage renal disease: Secondary | ICD-10-CM | POA: Diagnosis not present

## 2020-01-03 DIAGNOSIS — N2581 Secondary hyperparathyroidism of renal origin: Secondary | ICD-10-CM | POA: Diagnosis not present

## 2020-01-04 DIAGNOSIS — D509 Iron deficiency anemia, unspecified: Secondary | ICD-10-CM | POA: Diagnosis not present

## 2020-01-04 DIAGNOSIS — N186 End stage renal disease: Secondary | ICD-10-CM | POA: Diagnosis not present

## 2020-01-04 DIAGNOSIS — D631 Anemia in chronic kidney disease: Secondary | ICD-10-CM | POA: Diagnosis not present

## 2020-01-04 DIAGNOSIS — N2581 Secondary hyperparathyroidism of renal origin: Secondary | ICD-10-CM | POA: Diagnosis not present

## 2020-01-05 DIAGNOSIS — N2581 Secondary hyperparathyroidism of renal origin: Secondary | ICD-10-CM | POA: Diagnosis not present

## 2020-01-05 DIAGNOSIS — D631 Anemia in chronic kidney disease: Secondary | ICD-10-CM | POA: Diagnosis not present

## 2020-01-05 DIAGNOSIS — D509 Iron deficiency anemia, unspecified: Secondary | ICD-10-CM | POA: Diagnosis not present

## 2020-01-05 DIAGNOSIS — N186 End stage renal disease: Secondary | ICD-10-CM | POA: Diagnosis not present

## 2020-01-06 DIAGNOSIS — N186 End stage renal disease: Secondary | ICD-10-CM | POA: Diagnosis not present

## 2020-01-06 DIAGNOSIS — D509 Iron deficiency anemia, unspecified: Secondary | ICD-10-CM | POA: Diagnosis not present

## 2020-01-06 DIAGNOSIS — D631 Anemia in chronic kidney disease: Secondary | ICD-10-CM | POA: Diagnosis not present

## 2020-01-06 DIAGNOSIS — N2581 Secondary hyperparathyroidism of renal origin: Secondary | ICD-10-CM | POA: Diagnosis not present

## 2020-01-07 DIAGNOSIS — N186 End stage renal disease: Secondary | ICD-10-CM | POA: Diagnosis not present

## 2020-01-07 DIAGNOSIS — N2581 Secondary hyperparathyroidism of renal origin: Secondary | ICD-10-CM | POA: Diagnosis not present

## 2020-01-07 DIAGNOSIS — D631 Anemia in chronic kidney disease: Secondary | ICD-10-CM | POA: Diagnosis not present

## 2020-01-07 DIAGNOSIS — D509 Iron deficiency anemia, unspecified: Secondary | ICD-10-CM | POA: Diagnosis not present

## 2020-01-08 DIAGNOSIS — D631 Anemia in chronic kidney disease: Secondary | ICD-10-CM | POA: Diagnosis not present

## 2020-01-08 DIAGNOSIS — N2581 Secondary hyperparathyroidism of renal origin: Secondary | ICD-10-CM | POA: Diagnosis not present

## 2020-01-08 DIAGNOSIS — D509 Iron deficiency anemia, unspecified: Secondary | ICD-10-CM | POA: Diagnosis not present

## 2020-01-08 DIAGNOSIS — N186 End stage renal disease: Secondary | ICD-10-CM | POA: Diagnosis not present

## 2020-01-09 DIAGNOSIS — N2581 Secondary hyperparathyroidism of renal origin: Secondary | ICD-10-CM | POA: Diagnosis not present

## 2020-01-09 DIAGNOSIS — N186 End stage renal disease: Secondary | ICD-10-CM | POA: Diagnosis not present

## 2020-01-09 DIAGNOSIS — D509 Iron deficiency anemia, unspecified: Secondary | ICD-10-CM | POA: Diagnosis not present

## 2020-01-09 DIAGNOSIS — D631 Anemia in chronic kidney disease: Secondary | ICD-10-CM | POA: Diagnosis not present

## 2020-01-10 DIAGNOSIS — N186 End stage renal disease: Secondary | ICD-10-CM | POA: Diagnosis not present

## 2020-01-10 DIAGNOSIS — D509 Iron deficiency anemia, unspecified: Secondary | ICD-10-CM | POA: Diagnosis not present

## 2020-01-10 DIAGNOSIS — D631 Anemia in chronic kidney disease: Secondary | ICD-10-CM | POA: Diagnosis not present

## 2020-01-10 DIAGNOSIS — N2581 Secondary hyperparathyroidism of renal origin: Secondary | ICD-10-CM | POA: Diagnosis not present

## 2020-01-11 DIAGNOSIS — N186 End stage renal disease: Secondary | ICD-10-CM | POA: Diagnosis not present

## 2020-01-11 DIAGNOSIS — D509 Iron deficiency anemia, unspecified: Secondary | ICD-10-CM | POA: Diagnosis not present

## 2020-01-11 DIAGNOSIS — D631 Anemia in chronic kidney disease: Secondary | ICD-10-CM | POA: Diagnosis not present

## 2020-01-11 DIAGNOSIS — N2581 Secondary hyperparathyroidism of renal origin: Secondary | ICD-10-CM | POA: Diagnosis not present

## 2020-01-12 DIAGNOSIS — D509 Iron deficiency anemia, unspecified: Secondary | ICD-10-CM | POA: Diagnosis not present

## 2020-01-12 DIAGNOSIS — N186 End stage renal disease: Secondary | ICD-10-CM | POA: Diagnosis not present

## 2020-01-12 DIAGNOSIS — N2581 Secondary hyperparathyroidism of renal origin: Secondary | ICD-10-CM | POA: Diagnosis not present

## 2020-01-12 DIAGNOSIS — D631 Anemia in chronic kidney disease: Secondary | ICD-10-CM | POA: Diagnosis not present

## 2020-01-13 ENCOUNTER — Telehealth: Payer: Self-pay

## 2020-01-13 DIAGNOSIS — D509 Iron deficiency anemia, unspecified: Secondary | ICD-10-CM | POA: Diagnosis not present

## 2020-01-13 DIAGNOSIS — N2581 Secondary hyperparathyroidism of renal origin: Secondary | ICD-10-CM | POA: Diagnosis not present

## 2020-01-13 DIAGNOSIS — D631 Anemia in chronic kidney disease: Secondary | ICD-10-CM | POA: Diagnosis not present

## 2020-01-13 DIAGNOSIS — N186 End stage renal disease: Secondary | ICD-10-CM | POA: Diagnosis not present

## 2020-01-13 NOTE — Telephone Encounter (Signed)
New message  Per patient   Blood sugar this morning was  343 can she increase Novolin N   Currently taken 5 units twice a day in the evening the blood sugar reading increase to high 300 ranges.  Please advise.

## 2020-01-14 DIAGNOSIS — D631 Anemia in chronic kidney disease: Secondary | ICD-10-CM | POA: Diagnosis not present

## 2020-01-14 DIAGNOSIS — N2581 Secondary hyperparathyroidism of renal origin: Secondary | ICD-10-CM | POA: Diagnosis not present

## 2020-01-14 DIAGNOSIS — D509 Iron deficiency anemia, unspecified: Secondary | ICD-10-CM | POA: Diagnosis not present

## 2020-01-14 DIAGNOSIS — N186 End stage renal disease: Secondary | ICD-10-CM | POA: Diagnosis not present

## 2020-01-14 MED ORDER — NOVOLOG 100 UNIT/ML ~~LOC~~ SOLN: 7.0000 [IU] | Freq: Two times a day (BID) | SUBCUTANEOUS | 11 refills | Status: DC

## 2020-01-14 NOTE — Telephone Encounter (Signed)
Take 7 units of Novolin N bid Thx

## 2020-01-14 NOTE — Telephone Encounter (Signed)
Pt informed of below.  

## 2020-01-15 DIAGNOSIS — N186 End stage renal disease: Secondary | ICD-10-CM | POA: Diagnosis not present

## 2020-01-15 DIAGNOSIS — D631 Anemia in chronic kidney disease: Secondary | ICD-10-CM | POA: Diagnosis not present

## 2020-01-15 DIAGNOSIS — N2581 Secondary hyperparathyroidism of renal origin: Secondary | ICD-10-CM | POA: Diagnosis not present

## 2020-01-15 DIAGNOSIS — D509 Iron deficiency anemia, unspecified: Secondary | ICD-10-CM | POA: Diagnosis not present

## 2020-01-16 DIAGNOSIS — N2581 Secondary hyperparathyroidism of renal origin: Secondary | ICD-10-CM | POA: Diagnosis not present

## 2020-01-16 DIAGNOSIS — D509 Iron deficiency anemia, unspecified: Secondary | ICD-10-CM | POA: Diagnosis not present

## 2020-01-16 DIAGNOSIS — N186 End stage renal disease: Secondary | ICD-10-CM | POA: Diagnosis not present

## 2020-01-16 DIAGNOSIS — D631 Anemia in chronic kidney disease: Secondary | ICD-10-CM | POA: Diagnosis not present

## 2020-01-17 DIAGNOSIS — D509 Iron deficiency anemia, unspecified: Secondary | ICD-10-CM | POA: Diagnosis not present

## 2020-01-17 DIAGNOSIS — N2581 Secondary hyperparathyroidism of renal origin: Secondary | ICD-10-CM | POA: Diagnosis not present

## 2020-01-17 DIAGNOSIS — N186 End stage renal disease: Secondary | ICD-10-CM | POA: Diagnosis not present

## 2020-01-17 DIAGNOSIS — D631 Anemia in chronic kidney disease: Secondary | ICD-10-CM | POA: Diagnosis not present

## 2020-01-18 DIAGNOSIS — D631 Anemia in chronic kidney disease: Secondary | ICD-10-CM | POA: Diagnosis not present

## 2020-01-18 DIAGNOSIS — D509 Iron deficiency anemia, unspecified: Secondary | ICD-10-CM | POA: Diagnosis not present

## 2020-01-18 DIAGNOSIS — N186 End stage renal disease: Secondary | ICD-10-CM | POA: Diagnosis not present

## 2020-01-18 DIAGNOSIS — N2581 Secondary hyperparathyroidism of renal origin: Secondary | ICD-10-CM | POA: Diagnosis not present

## 2020-01-19 DIAGNOSIS — N2581 Secondary hyperparathyroidism of renal origin: Secondary | ICD-10-CM | POA: Diagnosis not present

## 2020-01-19 DIAGNOSIS — D509 Iron deficiency anemia, unspecified: Secondary | ICD-10-CM | POA: Diagnosis not present

## 2020-01-19 DIAGNOSIS — N186 End stage renal disease: Secondary | ICD-10-CM | POA: Diagnosis not present

## 2020-01-19 DIAGNOSIS — D631 Anemia in chronic kidney disease: Secondary | ICD-10-CM | POA: Diagnosis not present

## 2020-01-20 DIAGNOSIS — N186 End stage renal disease: Secondary | ICD-10-CM | POA: Diagnosis not present

## 2020-01-20 DIAGNOSIS — D631 Anemia in chronic kidney disease: Secondary | ICD-10-CM | POA: Diagnosis not present

## 2020-01-20 DIAGNOSIS — D509 Iron deficiency anemia, unspecified: Secondary | ICD-10-CM | POA: Diagnosis not present

## 2020-01-20 DIAGNOSIS — N2581 Secondary hyperparathyroidism of renal origin: Secondary | ICD-10-CM | POA: Diagnosis not present

## 2020-01-21 ENCOUNTER — Other Ambulatory Visit: Payer: Self-pay

## 2020-01-21 ENCOUNTER — Encounter: Payer: Self-pay | Admitting: Internal Medicine

## 2020-01-21 ENCOUNTER — Ambulatory Visit (INDEPENDENT_AMBULATORY_CARE_PROVIDER_SITE_OTHER): Payer: Medicare HMO | Admitting: Internal Medicine

## 2020-01-21 DIAGNOSIS — N185 Chronic kidney disease, stage 5: Secondary | ICD-10-CM | POA: Diagnosis not present

## 2020-01-21 DIAGNOSIS — D509 Iron deficiency anemia, unspecified: Secondary | ICD-10-CM | POA: Diagnosis not present

## 2020-01-21 DIAGNOSIS — N186 End stage renal disease: Secondary | ICD-10-CM | POA: Diagnosis not present

## 2020-01-21 DIAGNOSIS — L97521 Non-pressure chronic ulcer of other part of left foot limited to breakdown of skin: Secondary | ICD-10-CM

## 2020-01-21 DIAGNOSIS — I129 Hypertensive chronic kidney disease with stage 1 through stage 4 chronic kidney disease, or unspecified chronic kidney disease: Secondary | ICD-10-CM

## 2020-01-21 DIAGNOSIS — N2581 Secondary hyperparathyroidism of renal origin: Secondary | ICD-10-CM | POA: Diagnosis not present

## 2020-01-21 DIAGNOSIS — D631 Anemia in chronic kidney disease: Secondary | ICD-10-CM | POA: Diagnosis not present

## 2020-01-21 MED ORDER — LABETALOL HCL 200 MG PO TABS: 200.0000 mg | ORAL_TABLET | Freq: Three times a day (TID) | ORAL | 3 refills | Status: DC

## 2020-01-21 MED ORDER — GLUCOSE BLOOD VI STRP: ORAL_STRIP | 3 refills | Status: DC

## 2020-01-21 NOTE — Assessment & Plan Note (Signed)
Healed

## 2020-01-21 NOTE — Assessment & Plan Note (Addendum)
On PD Not on a transplant list yet due to her CVA

## 2020-01-21 NOTE — Assessment & Plan Note (Addendum)
Labetalol ON PD for ESRD

## 2020-01-21 NOTE — Progress Notes (Signed)
Subjective:  Patient ID: Mary Valencia, female    DOB: 01-19-1956  Age: 65 y.o. MRN: [id number]  CC: No chief complaint on file.   HPI Mary Valencia presents for L foot ulcer f/u and HTN f/u On PD Not on a transplant list yet due to her CVA  Outpatient Medications Prior to Visit  Medication Sig Dispense Refill  . ACCU-CHEK FASTCLIX LANCETS MISC Use to check blood glucose TID (DX: E11.8, Z79.4) 300 each 3  . Alcohol Swabs (B-D SINGLE USE SWABS REGULAR) PADS Use as directed 100 each 3  . atorvastatin (LIPITOR) 40 MG tablet Take 1 tablet (40 mg total) by mouth daily. 90 tablet 3  . Blood Glucose Calibration (ACCU-CHEK SMARTVIEW CONTROL) LIQD Use as directed 1 each 1  . Blood Glucose Monitoring Suppl (ACCU-CHEK NANO SMARTVIEW) w/Device KIT Use to check blood glucose TID (DX: E11.8, Z79.4) 1 kit 0  . calcitRIOL (ROCALTROL) 0.25 MCG capsule Take 0.25 mcg by mouth daily.    . calcium acetate, Phos Binder, (PHOSLYRA) 667 MG/5ML SOLN Take 667 mg by mouth 3 (three) times daily with meals.    . calcium carbonate (TUMS - DOSED IN MG ELEMENTAL CALCIUM) 500 MG chewable tablet     . CALCIUM-MAGNESIUM-VITAMIN D PO Take 1 tablet by mouth daily.    . cinacalcet (SENSIPAR) 30 MG tablet Take 30 mg by mouth daily.    . Continuous Blood Gluc Receiver (FREESTYLE LIBRE 14 DAY READER) DEVI 1 Units by Does not apply route daily. 1 each 1  . Continuous Blood Gluc Sensor (FREESTYLE LIBRE 14 DAY SENSOR) MISC 1 Units by Does not apply route every 14 (fourteen) days. 6 each 3  . diphenhydrAMINE (BENADRYL) 25 mg capsule Take 25 mg by mouth as needed.    Marland Kitchen glucose blood test strip TEST BLOOD GLUCOSE THREE TIMES DAILY 300 each 3  . hydrALAZINE (APRESOLINE) 100 MG tablet Take 1 tablet (100 mg total) by mouth 3 (three) times daily. 270 tablet 3  . Insulin NPH Human, Isophane, (NOVOLIN N [place]) Inject 2 Doses into the skin. 5 mg in morning, 7 mg at night    . labetalol (NORMODYNE) 200 MG tablet Take 200 mg by mouth 3  (three) times daily.    Marland Kitchen losartan (COZAAR) 50 MG tablet Take 1 tablet (50 mg total) by mouth daily. (Patient taking differently: Take 50 mg by mouth in the morning and at bedtime. ) 90 tablet 3  . mupirocin ointment (BACTROBAN) 2 % On leg wound w/dressing change qd or bid 30 g 1  . NIFEdipine (ADALAT CC) 60 MG 24 hr tablet     . NIFEdipine (PROCARDIA XL/NIFEDICAL XL) 60 MG 24 hr tablet Take 1 tablet (60 mg total) by mouth 2 (two) times daily. 180 tablet 3  . NOVOLOG 100 UNIT/ML injection Inject 7 Units into the skin in the morning and at bedtime. 10 mL 11  . omeprazole (PRILOSEC) 40 MG capsule Take 1 capsule (40 mg total) by mouth daily. 90 capsule 3  . polyethylene glycol (MIRALAX / GLYCOLAX) packet Take 17 g by mouth.    Marland Kitchen PRESCRIPTION MEDICATION 210 mg 3 (three) times daily before meals.     . senna-docusate (SENOKOT-S) 8.6-50 MG tablet Take by mouth 2 (two) times daily. Take 2 tab twice daily.    Marland Kitchen torsemide (DEMADEX) 20 MG tablet Take 40 mg by mouth.      No facility-administered medications prior to visit.    ROS: Review of Systems  Constitutional:  Negative for activity change, appetite change, chills, fatigue and unexpected weight change.  HENT: Negative for congestion, mouth sores and sinus pressure.   Eyes: Negative for visual disturbance.  Respiratory: Negative for cough and chest tightness.   Gastrointestinal: Negative for abdominal pain and nausea.  Genitourinary: Negative for difficulty urinating, frequency and vaginal pain.  Musculoskeletal: Positive for arthralgias and gait problem. Negative for back pain.  Skin: Negative for pallor and rash.  Neurological: Positive for weakness. Negative for dizziness, tremors, numbness and headaches.  Psychiatric/Behavioral: Negative for confusion, sleep disturbance and suicidal ideas.    Objective:  BP (!) 180/100 (BP Location: Left Arm, Patient Position: Sitting, Cuff Size: Normal)   Pulse 65   Temp 98.3 F (36.8 C) (Oral)   Ht  _0  (1.575 m)   Wt 134 lb (60.8 kg)   SpO2 97%   BMI 24.51 kg/m   BP Readings from Last 3 Encounters:  01/21/20 (!) 180/100  12/24/19 (!) 190/70  09/22/19 (!) 144/80    Wt Readings from Last 3 Encounters:  01/21/20 134 lb (60.8 kg)  12/24/19 142 lb (64.4 kg)  09/22/19 141 lb (64 kg)    Physical Exam Constitutional:      General: She is not in acute distress.    Appearance: She is well-developed.  HENT:     Head: Normocephalic.     Right Ear: External ear normal.     Left Ear: External ear normal.     Nose: Nose normal.  Eyes:     General:        Right eye: No discharge.        Left eye: No discharge.     Conjunctiva/sclera: Conjunctivae normal.     Pupils: Pupils are equal, round, and reactive to light.  Neck:     Thyroid: No thyromegaly.     Vascular: No JVD.     Trachea: No tracheal deviation.  Cardiovascular:     Rate and Rhythm: Normal rate and regular rhythm.     Heart sounds: Normal heart sounds.  Pulmonary:     Effort: No respiratory distress.     Breath sounds: No stridor. No wheezing.  Abdominal:     General: Bowel sounds are normal. There is no distension.     Palpations: Abdomen is soft. There is no mass.     Tenderness: There is no abdominal tenderness. There is no guarding or rebound.  Musculoskeletal:        General: No tenderness.     Cervical back: Normal range of motion and neck supple.  Lymphadenopathy:     Cervical: No cervical adenopathy.  Skin:    Findings: No erythema or rash.  Neurological:     Mental Status: She is oriented to person, place, and time.     Cranial Nerves: No cranial nerve deficit.     Motor: No abnormal muscle tone.     Coordination: Coordination abnormal.     Deep Tendon Reflexes: Reflexes normal.  Psychiatric:        Behavior: Behavior normal.        Thought Content: Thought content normal.        Judgment: Judgment normal.    L foot ulcer healed In a w/c, cane   FTF >25 min   Lab Results  Component  Value Date   WBC 7.7 08/29/2017   HGB 29.1 09/01/2017   HCT 30 (A) 08/29/2017   PLT 203 08/29/2017   GLUCOSE 279 (H) 09/21/2017   CHOL  161 09/21/2017   TRIG 72.0 09/21/2017   HDL 63.60 09/21/2017   LDLDIRECT 207.8 09/29/2010   LDLCALC 83 09/21/2017   ALT 6 09/21/2017   AST 21 09/21/2017   NA 141 09/21/2017   K 4.9 09/21/2017   CL 98 09/21/2017   CREATININE 13.49 (HH) 09/21/2017   BUN 66 (H) 09/21/2017   CO2 27 09/21/2017   TSH 2.81 09/21/2017   HGBA1C 7.1 (H) 09/21/2017    [place] LT LOWER EXTREM LTD SOFT TISSUE NON VASCULAR  Result Date: 08/30/2017 CLINICAL DATA:  Ganglion cyst. Lump on the LEFT LATERAL LOWER leg for 7 weeks. Masses occasionally tender and varies in size. EXAM: ULTRASOUND LEFT LOWER EXTREMITY LIMITED TECHNIQUE: Ultrasound examination of the lower extremity soft tissues was performed in the area of clinical concern. COMPARISON:  None. FINDINGS: Soft Tissue Structures: There is mild edema within the subcutaneous tissue in the area of patient's concern. No mass or fluid collection. No cystic abnormality. IMPRESSION: Mild soft tissue edema.  No mass. Electronically Signed   By: Nolon Nations M.D.   On: 08/30/2017 17:03    Assessment & Plan:    Walker Kehr, MD

## 2020-01-22 DIAGNOSIS — D631 Anemia in chronic kidney disease: Secondary | ICD-10-CM | POA: Diagnosis not present

## 2020-01-22 DIAGNOSIS — N186 End stage renal disease: Secondary | ICD-10-CM | POA: Diagnosis not present

## 2020-01-22 DIAGNOSIS — N2581 Secondary hyperparathyroidism of renal origin: Secondary | ICD-10-CM | POA: Diagnosis not present

## 2020-01-22 DIAGNOSIS — D509 Iron deficiency anemia, unspecified: Secondary | ICD-10-CM | POA: Diagnosis not present

## 2020-01-23 DIAGNOSIS — D631 Anemia in chronic kidney disease: Secondary | ICD-10-CM | POA: Diagnosis not present

## 2020-01-23 DIAGNOSIS — D509 Iron deficiency anemia, unspecified: Secondary | ICD-10-CM | POA: Diagnosis not present

## 2020-01-23 DIAGNOSIS — N186 End stage renal disease: Secondary | ICD-10-CM | POA: Diagnosis not present

## 2020-01-23 DIAGNOSIS — N2581 Secondary hyperparathyroidism of renal origin: Secondary | ICD-10-CM | POA: Diagnosis not present

## 2020-01-24 DIAGNOSIS — N2581 Secondary hyperparathyroidism of renal origin: Secondary | ICD-10-CM | POA: Diagnosis not present

## 2020-01-24 DIAGNOSIS — D509 Iron deficiency anemia, unspecified: Secondary | ICD-10-CM | POA: Diagnosis not present

## 2020-01-24 DIAGNOSIS — D631 Anemia in chronic kidney disease: Secondary | ICD-10-CM | POA: Diagnosis not present

## 2020-01-24 DIAGNOSIS — N186 End stage renal disease: Secondary | ICD-10-CM | POA: Diagnosis not present

## 2020-01-24 DIAGNOSIS — Z992 Dependence on renal dialysis: Secondary | ICD-10-CM | POA: Diagnosis not present

## 2020-01-25 DIAGNOSIS — N186 End stage renal disease: Secondary | ICD-10-CM | POA: Diagnosis not present

## 2020-01-25 DIAGNOSIS — D509 Iron deficiency anemia, unspecified: Secondary | ICD-10-CM | POA: Diagnosis not present

## 2020-01-25 DIAGNOSIS — N2581 Secondary hyperparathyroidism of renal origin: Secondary | ICD-10-CM | POA: Diagnosis not present

## 2020-01-25 DIAGNOSIS — D631 Anemia in chronic kidney disease: Secondary | ICD-10-CM | POA: Diagnosis not present

## 2020-01-26 DIAGNOSIS — N2581 Secondary hyperparathyroidism of renal origin: Secondary | ICD-10-CM | POA: Diagnosis not present

## 2020-01-26 DIAGNOSIS — N186 End stage renal disease: Secondary | ICD-10-CM | POA: Diagnosis not present

## 2020-01-26 DIAGNOSIS — D509 Iron deficiency anemia, unspecified: Secondary | ICD-10-CM | POA: Diagnosis not present

## 2020-01-26 DIAGNOSIS — D631 Anemia in chronic kidney disease: Secondary | ICD-10-CM | POA: Diagnosis not present

## 2020-01-27 DIAGNOSIS — N2581 Secondary hyperparathyroidism of renal origin: Secondary | ICD-10-CM | POA: Diagnosis not present

## 2020-01-27 DIAGNOSIS — D509 Iron deficiency anemia, unspecified: Secondary | ICD-10-CM | POA: Diagnosis not present

## 2020-01-27 DIAGNOSIS — N186 End stage renal disease: Secondary | ICD-10-CM | POA: Diagnosis not present

## 2020-01-27 DIAGNOSIS — D631 Anemia in chronic kidney disease: Secondary | ICD-10-CM | POA: Diagnosis not present

## 2020-01-28 DIAGNOSIS — D631 Anemia in chronic kidney disease: Secondary | ICD-10-CM | POA: Diagnosis not present

## 2020-01-28 DIAGNOSIS — D509 Iron deficiency anemia, unspecified: Secondary | ICD-10-CM | POA: Diagnosis not present

## 2020-01-28 DIAGNOSIS — N2581 Secondary hyperparathyroidism of renal origin: Secondary | ICD-10-CM | POA: Diagnosis not present

## 2020-01-28 DIAGNOSIS — N186 End stage renal disease: Secondary | ICD-10-CM | POA: Diagnosis not present

## 2020-01-29 DIAGNOSIS — N186 End stage renal disease: Secondary | ICD-10-CM | POA: Diagnosis not present

## 2020-01-29 DIAGNOSIS — D631 Anemia in chronic kidney disease: Secondary | ICD-10-CM | POA: Diagnosis not present

## 2020-01-29 DIAGNOSIS — N2581 Secondary hyperparathyroidism of renal origin: Secondary | ICD-10-CM | POA: Diagnosis not present

## 2020-01-29 DIAGNOSIS — E119 Type 2 diabetes mellitus without complications: Secondary | ICD-10-CM | POA: Diagnosis not present

## 2020-01-29 DIAGNOSIS — D509 Iron deficiency anemia, unspecified: Secondary | ICD-10-CM | POA: Diagnosis not present

## 2020-01-30 DIAGNOSIS — N186 End stage renal disease: Secondary | ICD-10-CM | POA: Diagnosis not present

## 2020-01-30 DIAGNOSIS — D509 Iron deficiency anemia, unspecified: Secondary | ICD-10-CM | POA: Diagnosis not present

## 2020-01-30 DIAGNOSIS — N2581 Secondary hyperparathyroidism of renal origin: Secondary | ICD-10-CM | POA: Diagnosis not present

## 2020-01-30 DIAGNOSIS — D631 Anemia in chronic kidney disease: Secondary | ICD-10-CM | POA: Diagnosis not present

## 2020-01-31 DIAGNOSIS — N2581 Secondary hyperparathyroidism of renal origin: Secondary | ICD-10-CM | POA: Diagnosis not present

## 2020-01-31 DIAGNOSIS — D509 Iron deficiency anemia, unspecified: Secondary | ICD-10-CM | POA: Diagnosis not present

## 2020-01-31 DIAGNOSIS — D631 Anemia in chronic kidney disease: Secondary | ICD-10-CM | POA: Diagnosis not present

## 2020-01-31 DIAGNOSIS — N186 End stage renal disease: Secondary | ICD-10-CM | POA: Diagnosis not present

## 2020-02-01 DIAGNOSIS — D631 Anemia in chronic kidney disease: Secondary | ICD-10-CM | POA: Diagnosis not present

## 2020-02-01 DIAGNOSIS — N186 End stage renal disease: Secondary | ICD-10-CM | POA: Diagnosis not present

## 2020-02-01 DIAGNOSIS — N2581 Secondary hyperparathyroidism of renal origin: Secondary | ICD-10-CM | POA: Diagnosis not present

## 2020-02-01 DIAGNOSIS — D509 Iron deficiency anemia, unspecified: Secondary | ICD-10-CM | POA: Diagnosis not present

## 2020-02-02 DIAGNOSIS — D631 Anemia in chronic kidney disease: Secondary | ICD-10-CM | POA: Diagnosis not present

## 2020-02-02 DIAGNOSIS — N2581 Secondary hyperparathyroidism of renal origin: Secondary | ICD-10-CM | POA: Diagnosis not present

## 2020-02-02 DIAGNOSIS — N186 End stage renal disease: Secondary | ICD-10-CM | POA: Diagnosis not present

## 2020-02-02 DIAGNOSIS — D509 Iron deficiency anemia, unspecified: Secondary | ICD-10-CM | POA: Diagnosis not present

## 2020-02-03 DIAGNOSIS — N2581 Secondary hyperparathyroidism of renal origin: Secondary | ICD-10-CM | POA: Diagnosis not present

## 2020-02-03 DIAGNOSIS — N186 End stage renal disease: Secondary | ICD-10-CM | POA: Diagnosis not present

## 2020-02-03 DIAGNOSIS — D631 Anemia in chronic kidney disease: Secondary | ICD-10-CM | POA: Diagnosis not present

## 2020-02-03 DIAGNOSIS — D509 Iron deficiency anemia, unspecified: Secondary | ICD-10-CM | POA: Diagnosis not present

## 2020-02-04 DIAGNOSIS — N2581 Secondary hyperparathyroidism of renal origin: Secondary | ICD-10-CM | POA: Diagnosis not present

## 2020-02-04 DIAGNOSIS — N186 End stage renal disease: Secondary | ICD-10-CM | POA: Diagnosis not present

## 2020-02-04 DIAGNOSIS — D631 Anemia in chronic kidney disease: Secondary | ICD-10-CM | POA: Diagnosis not present

## 2020-02-04 DIAGNOSIS — D509 Iron deficiency anemia, unspecified: Secondary | ICD-10-CM | POA: Diagnosis not present

## 2020-02-05 DIAGNOSIS — D509 Iron deficiency anemia, unspecified: Secondary | ICD-10-CM | POA: Diagnosis not present

## 2020-02-05 DIAGNOSIS — N2581 Secondary hyperparathyroidism of renal origin: Secondary | ICD-10-CM | POA: Diagnosis not present

## 2020-02-05 DIAGNOSIS — N186 End stage renal disease: Secondary | ICD-10-CM | POA: Diagnosis not present

## 2020-02-05 DIAGNOSIS — D631 Anemia in chronic kidney disease: Secondary | ICD-10-CM | POA: Diagnosis not present

## 2020-02-06 DIAGNOSIS — D509 Iron deficiency anemia, unspecified: Secondary | ICD-10-CM | POA: Diagnosis not present

## 2020-02-06 DIAGNOSIS — N2581 Secondary hyperparathyroidism of renal origin: Secondary | ICD-10-CM | POA: Diagnosis not present

## 2020-02-06 DIAGNOSIS — D631 Anemia in chronic kidney disease: Secondary | ICD-10-CM | POA: Diagnosis not present

## 2020-02-06 DIAGNOSIS — N186 End stage renal disease: Secondary | ICD-10-CM | POA: Diagnosis not present

## 2020-02-07 DIAGNOSIS — D631 Anemia in chronic kidney disease: Secondary | ICD-10-CM | POA: Diagnosis not present

## 2020-02-07 DIAGNOSIS — D509 Iron deficiency anemia, unspecified: Secondary | ICD-10-CM | POA: Diagnosis not present

## 2020-02-07 DIAGNOSIS — N2581 Secondary hyperparathyroidism of renal origin: Secondary | ICD-10-CM | POA: Diagnosis not present

## 2020-02-07 DIAGNOSIS — N186 End stage renal disease: Secondary | ICD-10-CM | POA: Diagnosis not present

## 2020-02-08 DIAGNOSIS — N2581 Secondary hyperparathyroidism of renal origin: Secondary | ICD-10-CM | POA: Diagnosis not present

## 2020-02-08 DIAGNOSIS — D509 Iron deficiency anemia, unspecified: Secondary | ICD-10-CM | POA: Diagnosis not present

## 2020-02-08 DIAGNOSIS — N186 End stage renal disease: Secondary | ICD-10-CM | POA: Diagnosis not present

## 2020-02-08 DIAGNOSIS — D631 Anemia in chronic kidney disease: Secondary | ICD-10-CM | POA: Diagnosis not present

## 2020-02-09 DIAGNOSIS — D509 Iron deficiency anemia, unspecified: Secondary | ICD-10-CM | POA: Diagnosis not present

## 2020-02-09 DIAGNOSIS — N186 End stage renal disease: Secondary | ICD-10-CM | POA: Diagnosis not present

## 2020-02-09 DIAGNOSIS — N2581 Secondary hyperparathyroidism of renal origin: Secondary | ICD-10-CM | POA: Diagnosis not present

## 2020-02-09 DIAGNOSIS — D631 Anemia in chronic kidney disease: Secondary | ICD-10-CM | POA: Diagnosis not present

## 2020-02-10 DIAGNOSIS — N2581 Secondary hyperparathyroidism of renal origin: Secondary | ICD-10-CM | POA: Diagnosis not present

## 2020-02-10 DIAGNOSIS — D509 Iron deficiency anemia, unspecified: Secondary | ICD-10-CM | POA: Diagnosis not present

## 2020-02-10 DIAGNOSIS — N186 End stage renal disease: Secondary | ICD-10-CM | POA: Diagnosis not present

## 2020-02-10 DIAGNOSIS — D631 Anemia in chronic kidney disease: Secondary | ICD-10-CM | POA: Diagnosis not present

## 2020-02-11 DIAGNOSIS — N2581 Secondary hyperparathyroidism of renal origin: Secondary | ICD-10-CM | POA: Diagnosis not present

## 2020-02-11 DIAGNOSIS — D509 Iron deficiency anemia, unspecified: Secondary | ICD-10-CM | POA: Diagnosis not present

## 2020-02-11 DIAGNOSIS — N186 End stage renal disease: Secondary | ICD-10-CM | POA: Diagnosis not present

## 2020-02-11 DIAGNOSIS — D631 Anemia in chronic kidney disease: Secondary | ICD-10-CM | POA: Diagnosis not present

## 2020-02-12 DIAGNOSIS — D631 Anemia in chronic kidney disease: Secondary | ICD-10-CM | POA: Diagnosis not present

## 2020-02-12 DIAGNOSIS — N186 End stage renal disease: Secondary | ICD-10-CM | POA: Diagnosis not present

## 2020-02-12 DIAGNOSIS — D509 Iron deficiency anemia, unspecified: Secondary | ICD-10-CM | POA: Diagnosis not present

## 2020-02-12 DIAGNOSIS — N2581 Secondary hyperparathyroidism of renal origin: Secondary | ICD-10-CM | POA: Diagnosis not present

## 2020-02-13 DIAGNOSIS — N2581 Secondary hyperparathyroidism of renal origin: Secondary | ICD-10-CM | POA: Diagnosis not present

## 2020-02-13 DIAGNOSIS — D509 Iron deficiency anemia, unspecified: Secondary | ICD-10-CM | POA: Diagnosis not present

## 2020-02-13 DIAGNOSIS — N186 End stage renal disease: Secondary | ICD-10-CM | POA: Diagnosis not present

## 2020-02-13 DIAGNOSIS — D631 Anemia in chronic kidney disease: Secondary | ICD-10-CM | POA: Diagnosis not present

## 2020-02-14 DIAGNOSIS — N2581 Secondary hyperparathyroidism of renal origin: Secondary | ICD-10-CM | POA: Diagnosis not present

## 2020-02-14 DIAGNOSIS — D631 Anemia in chronic kidney disease: Secondary | ICD-10-CM | POA: Diagnosis not present

## 2020-02-14 DIAGNOSIS — D509 Iron deficiency anemia, unspecified: Secondary | ICD-10-CM | POA: Diagnosis not present

## 2020-02-14 DIAGNOSIS — N186 End stage renal disease: Secondary | ICD-10-CM | POA: Diagnosis not present

## 2020-02-15 DIAGNOSIS — D509 Iron deficiency anemia, unspecified: Secondary | ICD-10-CM | POA: Diagnosis not present

## 2020-02-15 DIAGNOSIS — N186 End stage renal disease: Secondary | ICD-10-CM | POA: Diagnosis not present

## 2020-02-15 DIAGNOSIS — N2581 Secondary hyperparathyroidism of renal origin: Secondary | ICD-10-CM | POA: Diagnosis not present

## 2020-02-15 DIAGNOSIS — D631 Anemia in chronic kidney disease: Secondary | ICD-10-CM | POA: Diagnosis not present

## 2020-02-16 DIAGNOSIS — N2581 Secondary hyperparathyroidism of renal origin: Secondary | ICD-10-CM | POA: Diagnosis not present

## 2020-02-16 DIAGNOSIS — D509 Iron deficiency anemia, unspecified: Secondary | ICD-10-CM | POA: Diagnosis not present

## 2020-02-16 DIAGNOSIS — D631 Anemia in chronic kidney disease: Secondary | ICD-10-CM | POA: Diagnosis not present

## 2020-02-16 DIAGNOSIS — N186 End stage renal disease: Secondary | ICD-10-CM | POA: Diagnosis not present

## 2020-02-17 DIAGNOSIS — N186 End stage renal disease: Secondary | ICD-10-CM | POA: Diagnosis not present

## 2020-02-17 DIAGNOSIS — D509 Iron deficiency anemia, unspecified: Secondary | ICD-10-CM | POA: Diagnosis not present

## 2020-02-17 DIAGNOSIS — N2581 Secondary hyperparathyroidism of renal origin: Secondary | ICD-10-CM | POA: Diagnosis not present

## 2020-02-17 DIAGNOSIS — D631 Anemia in chronic kidney disease: Secondary | ICD-10-CM | POA: Diagnosis not present

## 2020-02-18 DIAGNOSIS — N186 End stage renal disease: Secondary | ICD-10-CM | POA: Diagnosis not present

## 2020-02-18 DIAGNOSIS — D631 Anemia in chronic kidney disease: Secondary | ICD-10-CM | POA: Diagnosis not present

## 2020-02-18 DIAGNOSIS — N2581 Secondary hyperparathyroidism of renal origin: Secondary | ICD-10-CM | POA: Diagnosis not present

## 2020-02-18 DIAGNOSIS — D509 Iron deficiency anemia, unspecified: Secondary | ICD-10-CM | POA: Diagnosis not present

## 2020-02-19 DIAGNOSIS — N2581 Secondary hyperparathyroidism of renal origin: Secondary | ICD-10-CM | POA: Diagnosis not present

## 2020-02-19 DIAGNOSIS — N186 End stage renal disease: Secondary | ICD-10-CM | POA: Diagnosis not present

## 2020-02-19 DIAGNOSIS — D631 Anemia in chronic kidney disease: Secondary | ICD-10-CM | POA: Diagnosis not present

## 2020-02-19 DIAGNOSIS — D509 Iron deficiency anemia, unspecified: Secondary | ICD-10-CM | POA: Diagnosis not present

## 2020-02-20 DIAGNOSIS — N2581 Secondary hyperparathyroidism of renal origin: Secondary | ICD-10-CM | POA: Diagnosis not present

## 2020-02-20 DIAGNOSIS — D631 Anemia in chronic kidney disease: Secondary | ICD-10-CM | POA: Diagnosis not present

## 2020-02-20 DIAGNOSIS — D509 Iron deficiency anemia, unspecified: Secondary | ICD-10-CM | POA: Diagnosis not present

## 2020-02-20 DIAGNOSIS — N186 End stage renal disease: Secondary | ICD-10-CM | POA: Diagnosis not present

## 2020-02-21 DIAGNOSIS — D509 Iron deficiency anemia, unspecified: Secondary | ICD-10-CM | POA: Diagnosis not present

## 2020-02-21 DIAGNOSIS — N2581 Secondary hyperparathyroidism of renal origin: Secondary | ICD-10-CM | POA: Diagnosis not present

## 2020-02-21 DIAGNOSIS — D631 Anemia in chronic kidney disease: Secondary | ICD-10-CM | POA: Diagnosis not present

## 2020-02-21 DIAGNOSIS — N186 End stage renal disease: Secondary | ICD-10-CM | POA: Diagnosis not present

## 2020-02-22 DIAGNOSIS — N2581 Secondary hyperparathyroidism of renal origin: Secondary | ICD-10-CM | POA: Diagnosis not present

## 2020-02-22 DIAGNOSIS — D509 Iron deficiency anemia, unspecified: Secondary | ICD-10-CM | POA: Diagnosis not present

## 2020-02-22 DIAGNOSIS — N186 End stage renal disease: Secondary | ICD-10-CM | POA: Diagnosis not present

## 2020-02-22 DIAGNOSIS — D631 Anemia in chronic kidney disease: Secondary | ICD-10-CM | POA: Diagnosis not present

## 2020-02-23 DIAGNOSIS — D631 Anemia in chronic kidney disease: Secondary | ICD-10-CM | POA: Diagnosis not present

## 2020-02-23 DIAGNOSIS — D509 Iron deficiency anemia, unspecified: Secondary | ICD-10-CM | POA: Diagnosis not present

## 2020-02-23 DIAGNOSIS — N186 End stage renal disease: Secondary | ICD-10-CM | POA: Diagnosis not present

## 2020-02-23 DIAGNOSIS — N2581 Secondary hyperparathyroidism of renal origin: Secondary | ICD-10-CM | POA: Diagnosis not present

## 2020-02-24 DIAGNOSIS — N186 End stage renal disease: Secondary | ICD-10-CM | POA: Diagnosis not present

## 2020-02-24 DIAGNOSIS — Z992 Dependence on renal dialysis: Secondary | ICD-10-CM | POA: Diagnosis not present

## 2020-02-24 DIAGNOSIS — N2581 Secondary hyperparathyroidism of renal origin: Secondary | ICD-10-CM | POA: Diagnosis not present

## 2020-02-24 DIAGNOSIS — D631 Anemia in chronic kidney disease: Secondary | ICD-10-CM | POA: Diagnosis not present

## 2020-02-24 DIAGNOSIS — D509 Iron deficiency anemia, unspecified: Secondary | ICD-10-CM | POA: Diagnosis not present

## 2020-02-25 DIAGNOSIS — D631 Anemia in chronic kidney disease: Secondary | ICD-10-CM | POA: Diagnosis not present

## 2020-02-25 DIAGNOSIS — N186 End stage renal disease: Secondary | ICD-10-CM | POA: Diagnosis not present

## 2020-02-25 DIAGNOSIS — N2581 Secondary hyperparathyroidism of renal origin: Secondary | ICD-10-CM | POA: Diagnosis not present

## 2020-02-25 DIAGNOSIS — D509 Iron deficiency anemia, unspecified: Secondary | ICD-10-CM | POA: Diagnosis not present

## 2020-02-26 DIAGNOSIS — D509 Iron deficiency anemia, unspecified: Secondary | ICD-10-CM | POA: Diagnosis not present

## 2020-02-26 DIAGNOSIS — D631 Anemia in chronic kidney disease: Secondary | ICD-10-CM | POA: Diagnosis not present

## 2020-02-26 DIAGNOSIS — N2581 Secondary hyperparathyroidism of renal origin: Secondary | ICD-10-CM | POA: Diagnosis not present

## 2020-02-26 DIAGNOSIS — N186 End stage renal disease: Secondary | ICD-10-CM | POA: Diagnosis not present

## 2020-02-27 DIAGNOSIS — D509 Iron deficiency anemia, unspecified: Secondary | ICD-10-CM | POA: Diagnosis not present

## 2020-02-27 DIAGNOSIS — D631 Anemia in chronic kidney disease: Secondary | ICD-10-CM | POA: Diagnosis not present

## 2020-02-27 DIAGNOSIS — N2581 Secondary hyperparathyroidism of renal origin: Secondary | ICD-10-CM | POA: Diagnosis not present

## 2020-02-27 DIAGNOSIS — N186 End stage renal disease: Secondary | ICD-10-CM | POA: Diagnosis not present

## 2020-02-28 DIAGNOSIS — N186 End stage renal disease: Secondary | ICD-10-CM | POA: Diagnosis not present

## 2020-02-28 DIAGNOSIS — D631 Anemia in chronic kidney disease: Secondary | ICD-10-CM | POA: Diagnosis not present

## 2020-02-28 DIAGNOSIS — D509 Iron deficiency anemia, unspecified: Secondary | ICD-10-CM | POA: Diagnosis not present

## 2020-02-28 DIAGNOSIS — N2581 Secondary hyperparathyroidism of renal origin: Secondary | ICD-10-CM | POA: Diagnosis not present

## 2020-02-29 DIAGNOSIS — N186 End stage renal disease: Secondary | ICD-10-CM | POA: Diagnosis not present

## 2020-02-29 DIAGNOSIS — N2581 Secondary hyperparathyroidism of renal origin: Secondary | ICD-10-CM | POA: Diagnosis not present

## 2020-02-29 DIAGNOSIS — D509 Iron deficiency anemia, unspecified: Secondary | ICD-10-CM | POA: Diagnosis not present

## 2020-02-29 DIAGNOSIS — D631 Anemia in chronic kidney disease: Secondary | ICD-10-CM | POA: Diagnosis not present

## 2020-03-01 DIAGNOSIS — D631 Anemia in chronic kidney disease: Secondary | ICD-10-CM | POA: Diagnosis not present

## 2020-03-01 DIAGNOSIS — E119 Type 2 diabetes mellitus without complications: Secondary | ICD-10-CM | POA: Diagnosis not present

## 2020-03-01 DIAGNOSIS — N186 End stage renal disease: Secondary | ICD-10-CM | POA: Diagnosis not present

## 2020-03-01 DIAGNOSIS — D509 Iron deficiency anemia, unspecified: Secondary | ICD-10-CM | POA: Diagnosis not present

## 2020-03-01 DIAGNOSIS — N2581 Secondary hyperparathyroidism of renal origin: Secondary | ICD-10-CM | POA: Diagnosis not present

## 2020-03-02 ENCOUNTER — Telehealth: Payer: Self-pay | Admitting: Internal Medicine

## 2020-03-02 DIAGNOSIS — D509 Iron deficiency anemia, unspecified: Secondary | ICD-10-CM | POA: Diagnosis not present

## 2020-03-02 DIAGNOSIS — N186 End stage renal disease: Secondary | ICD-10-CM | POA: Diagnosis not present

## 2020-03-02 DIAGNOSIS — D631 Anemia in chronic kidney disease: Secondary | ICD-10-CM | POA: Diagnosis not present

## 2020-03-02 DIAGNOSIS — N2581 Secondary hyperparathyroidism of renal origin: Secondary | ICD-10-CM | POA: Diagnosis not present

## 2020-03-02 NOTE — Telephone Encounter (Signed)
Patient called and that she need trumentric diabetic strips order from [facility].

## 2020-03-03 DIAGNOSIS — D631 Anemia in chronic kidney disease: Secondary | ICD-10-CM | POA: Diagnosis not present

## 2020-03-03 DIAGNOSIS — N2581 Secondary hyperparathyroidism of renal origin: Secondary | ICD-10-CM | POA: Diagnosis not present

## 2020-03-03 DIAGNOSIS — N186 End stage renal disease: Secondary | ICD-10-CM | POA: Diagnosis not present

## 2020-03-03 DIAGNOSIS — D509 Iron deficiency anemia, unspecified: Secondary | ICD-10-CM | POA: Diagnosis not present

## 2020-03-04 DIAGNOSIS — N2581 Secondary hyperparathyroidism of renal origin: Secondary | ICD-10-CM | POA: Diagnosis not present

## 2020-03-04 DIAGNOSIS — N186 End stage renal disease: Secondary | ICD-10-CM | POA: Diagnosis not present

## 2020-03-04 DIAGNOSIS — D509 Iron deficiency anemia, unspecified: Secondary | ICD-10-CM | POA: Diagnosis not present

## 2020-03-04 DIAGNOSIS — D631 Anemia in chronic kidney disease: Secondary | ICD-10-CM | POA: Diagnosis not present

## 2020-03-04 NOTE — Telephone Encounter (Signed)
[place] and confirmed that pt does still have refills and they have placed an order

## 2020-03-05 DIAGNOSIS — Z992 Dependence on renal dialysis: Secondary | ICD-10-CM | POA: Diagnosis not present

## 2020-03-05 DIAGNOSIS — D631 Anemia in chronic kidney disease: Secondary | ICD-10-CM | POA: Diagnosis not present

## 2020-03-05 DIAGNOSIS — N186 End stage renal disease: Secondary | ICD-10-CM | POA: Diagnosis not present

## 2020-03-05 DIAGNOSIS — N2581 Secondary hyperparathyroidism of renal origin: Secondary | ICD-10-CM | POA: Diagnosis not present

## 2020-03-05 DIAGNOSIS — D509 Iron deficiency anemia, unspecified: Secondary | ICD-10-CM | POA: Diagnosis not present

## 2020-03-06 DIAGNOSIS — D631 Anemia in chronic kidney disease: Secondary | ICD-10-CM | POA: Diagnosis not present

## 2020-03-06 DIAGNOSIS — N186 End stage renal disease: Secondary | ICD-10-CM | POA: Diagnosis not present

## 2020-03-06 DIAGNOSIS — N2581 Secondary hyperparathyroidism of renal origin: Secondary | ICD-10-CM | POA: Diagnosis not present

## 2020-03-06 DIAGNOSIS — D509 Iron deficiency anemia, unspecified: Secondary | ICD-10-CM | POA: Diagnosis not present

## 2020-03-07 DIAGNOSIS — N2581 Secondary hyperparathyroidism of renal origin: Secondary | ICD-10-CM | POA: Diagnosis not present

## 2020-03-07 DIAGNOSIS — D509 Iron deficiency anemia, unspecified: Secondary | ICD-10-CM | POA: Diagnosis not present

## 2020-03-07 DIAGNOSIS — N186 End stage renal disease: Secondary | ICD-10-CM | POA: Diagnosis not present

## 2020-03-07 DIAGNOSIS — D631 Anemia in chronic kidney disease: Secondary | ICD-10-CM | POA: Diagnosis not present

## 2020-03-08 DIAGNOSIS — D631 Anemia in chronic kidney disease: Secondary | ICD-10-CM | POA: Diagnosis not present

## 2020-03-08 DIAGNOSIS — N2581 Secondary hyperparathyroidism of renal origin: Secondary | ICD-10-CM | POA: Diagnosis not present

## 2020-03-08 DIAGNOSIS — D509 Iron deficiency anemia, unspecified: Secondary | ICD-10-CM | POA: Diagnosis not present

## 2020-03-08 DIAGNOSIS — N186 End stage renal disease: Secondary | ICD-10-CM | POA: Diagnosis not present

## 2020-03-09 DIAGNOSIS — G928 Other toxic encephalopathy: Secondary | ICD-10-CM | POA: Diagnosis not present

## 2020-03-09 DIAGNOSIS — E1122 Type 2 diabetes mellitus with diabetic chronic kidney disease: Secondary | ICD-10-CM | POA: Diagnosis not present

## 2020-03-09 DIAGNOSIS — Z781 Physical restraint status: Secondary | ICD-10-CM | POA: Diagnosis not present

## 2020-03-09 DIAGNOSIS — Z992 Dependence on renal dialysis: Secondary | ICD-10-CM | POA: Diagnosis not present

## 2020-03-09 DIAGNOSIS — G9341 Metabolic encephalopathy: Secondary | ICD-10-CM | POA: Diagnosis not present

## 2020-03-09 DIAGNOSIS — R531 Weakness: Secondary | ICD-10-CM | POA: Diagnosis not present

## 2020-03-09 DIAGNOSIS — A4189 Other specified sepsis: Secondary | ICD-10-CM | POA: Diagnosis not present

## 2020-03-09 DIAGNOSIS — Z9981 Dependence on supplemental oxygen: Secondary | ICD-10-CM | POA: Diagnosis not present

## 2020-03-09 DIAGNOSIS — E872 Acidosis: Secondary | ICD-10-CM | POA: Diagnosis not present

## 2020-03-09 DIAGNOSIS — Z794 Long term (current) use of insulin: Secondary | ICD-10-CM | POA: Diagnosis not present

## 2020-03-09 DIAGNOSIS — D509 Iron deficiency anemia, unspecified: Secondary | ICD-10-CM | POA: Diagnosis not present

## 2020-03-09 DIAGNOSIS — K7460 Unspecified cirrhosis of liver: Secondary | ICD-10-CM | POA: Diagnosis not present

## 2020-03-09 DIAGNOSIS — R0902 Hypoxemia: Secondary | ICD-10-CM | POA: Diagnosis not present

## 2020-03-09 DIAGNOSIS — E876 Hypokalemia: Secondary | ICD-10-CM | POA: Diagnosis not present

## 2020-03-09 DIAGNOSIS — I6782 Cerebral ischemia: Secondary | ICD-10-CM | POA: Diagnosis not present

## 2020-03-09 DIAGNOSIS — Z452 Encounter for adjustment and management of vascular access device: Secondary | ICD-10-CM | POA: Diagnosis not present

## 2020-03-09 DIAGNOSIS — E870 Hyperosmolality and hypernatremia: Secondary | ICD-10-CM | POA: Diagnosis not present

## 2020-03-09 DIAGNOSIS — R6521 Severe sepsis with septic shock: Secondary | ICD-10-CM | POA: Diagnosis not present

## 2020-03-09 DIAGNOSIS — J1282 Pneumonia due to coronavirus disease 2019: Secondary | ICD-10-CM | POA: Diagnosis not present

## 2020-03-09 DIAGNOSIS — R6520 Severe sepsis without septic shock: Secondary | ICD-10-CM | POA: Diagnosis not present

## 2020-03-09 DIAGNOSIS — G9340 Encephalopathy, unspecified: Secondary | ICD-10-CM | POA: Diagnosis not present

## 2020-03-09 DIAGNOSIS — R1310 Dysphagia, unspecified: Secondary | ICD-10-CM | POA: Diagnosis not present

## 2020-03-09 DIAGNOSIS — N2581 Secondary hyperparathyroidism of renal origin: Secondary | ICD-10-CM | POA: Diagnosis not present

## 2020-03-09 DIAGNOSIS — R0602 Shortness of breath: Secondary | ICD-10-CM | POA: Diagnosis not present

## 2020-03-09 DIAGNOSIS — M2550 Pain in unspecified joint: Secondary | ICD-10-CM | POA: Diagnosis not present

## 2020-03-09 DIAGNOSIS — E785 Hyperlipidemia, unspecified: Secondary | ICD-10-CM | POA: Diagnosis not present

## 2020-03-09 DIAGNOSIS — A419 Sepsis, unspecified organism: Secondary | ICD-10-CM | POA: Diagnosis not present

## 2020-03-09 DIAGNOSIS — Z7401 Bed confinement status: Secondary | ICD-10-CM | POA: Diagnosis not present

## 2020-03-09 DIAGNOSIS — H748X1 Other specified disorders of right middle ear and mastoid: Secondary | ICD-10-CM | POA: Diagnosis not present

## 2020-03-09 DIAGNOSIS — Z23 Encounter for immunization: Secondary | ICD-10-CM | POA: Diagnosis not present

## 2020-03-09 DIAGNOSIS — I120 Hypertensive chronic kidney disease with stage 5 chronic kidney disease or end stage renal disease: Secondary | ICD-10-CM | POA: Diagnosis not present

## 2020-03-09 DIAGNOSIS — R509 Fever, unspecified: Secondary | ICD-10-CM | POA: Diagnosis not present

## 2020-03-09 DIAGNOSIS — R05 Cough: Secondary | ICD-10-CM | POA: Diagnosis not present

## 2020-03-09 DIAGNOSIS — J9601 Acute respiratory failure with hypoxia: Secondary | ICD-10-CM | POA: Diagnosis not present

## 2020-03-09 DIAGNOSIS — J811 Chronic pulmonary edema: Secondary | ICD-10-CM | POA: Diagnosis not present

## 2020-03-09 DIAGNOSIS — K219 Gastro-esophageal reflux disease without esophagitis: Secondary | ICD-10-CM | POA: Diagnosis not present

## 2020-03-09 DIAGNOSIS — R4182 Altered mental status, unspecified: Secondary | ICD-10-CM | POA: Diagnosis not present

## 2020-03-09 DIAGNOSIS — J159 Unspecified bacterial pneumonia: Secondary | ICD-10-CM | POA: Diagnosis not present

## 2020-03-09 DIAGNOSIS — I493 Ventricular premature depolarization: Secondary | ICD-10-CM | POA: Diagnosis not present

## 2020-03-09 DIAGNOSIS — J90 Pleural effusion, not elsewhere classified: Secondary | ICD-10-CM | POA: Diagnosis not present

## 2020-03-09 DIAGNOSIS — Z4682 Encounter for fitting and adjustment of non-vascular catheter: Secondary | ICD-10-CM | POA: Diagnosis not present

## 2020-03-09 DIAGNOSIS — E43 Unspecified severe protein-calorie malnutrition: Secondary | ICD-10-CM | POA: Diagnosis not present

## 2020-03-09 DIAGNOSIS — D631 Anemia in chronic kidney disease: Secondary | ICD-10-CM | POA: Diagnosis not present

## 2020-03-09 DIAGNOSIS — G92 Toxic encephalopathy: Secondary | ICD-10-CM | POA: Diagnosis not present

## 2020-03-09 DIAGNOSIS — U071 COVID-19: Secondary | ICD-10-CM | POA: Diagnosis not present

## 2020-03-09 DIAGNOSIS — Z8673 Personal history of transient ischemic attack (TIA), and cerebral infarction without residual deficits: Secondary | ICD-10-CM | POA: Diagnosis not present

## 2020-03-09 DIAGNOSIS — E1110 Type 2 diabetes mellitus with ketoacidosis without coma: Secondary | ICD-10-CM | POA: Diagnosis not present

## 2020-03-09 DIAGNOSIS — E1165 Type 2 diabetes mellitus with hyperglycemia: Secondary | ICD-10-CM | POA: Diagnosis not present

## 2020-03-09 DIAGNOSIS — G319 Degenerative disease of nervous system, unspecified: Secondary | ICD-10-CM | POA: Diagnosis not present

## 2020-03-09 DIAGNOSIS — N186 End stage renal disease: Secondary | ICD-10-CM | POA: Diagnosis not present

## 2020-03-09 DIAGNOSIS — R918 Other nonspecific abnormal finding of lung field: Secondary | ICD-10-CM | POA: Diagnosis not present

## 2020-03-09 DIAGNOSIS — E119 Type 2 diabetes mellitus without complications: Secondary | ICD-10-CM | POA: Diagnosis not present

## 2020-03-10 DIAGNOSIS — E1122 Type 2 diabetes mellitus with diabetic chronic kidney disease: Secondary | ICD-10-CM | POA: Diagnosis not present

## 2020-03-10 DIAGNOSIS — Z992 Dependence on renal dialysis: Secondary | ICD-10-CM | POA: Diagnosis not present

## 2020-03-10 DIAGNOSIS — N186 End stage renal disease: Secondary | ICD-10-CM | POA: Diagnosis not present

## 2020-03-10 DIAGNOSIS — I120 Hypertensive chronic kidney disease with stage 5 chronic kidney disease or end stage renal disease: Secondary | ICD-10-CM | POA: Diagnosis not present

## 2020-03-10 DIAGNOSIS — Z794 Long term (current) use of insulin: Secondary | ICD-10-CM | POA: Diagnosis not present

## 2020-03-10 DIAGNOSIS — U071 COVID-19: Secondary | ICD-10-CM | POA: Diagnosis not present

## 2020-03-10 DIAGNOSIS — N2581 Secondary hyperparathyroidism of renal origin: Secondary | ICD-10-CM | POA: Diagnosis not present

## 2020-03-10 DIAGNOSIS — D631 Anemia in chronic kidney disease: Secondary | ICD-10-CM | POA: Diagnosis not present

## 2020-03-10 DIAGNOSIS — D509 Iron deficiency anemia, unspecified: Secondary | ICD-10-CM | POA: Diagnosis not present

## 2020-03-11 DIAGNOSIS — J1282 Pneumonia due to coronavirus disease 2019: Secondary | ICD-10-CM | POA: Diagnosis not present

## 2020-03-11 DIAGNOSIS — E1122 Type 2 diabetes mellitus with diabetic chronic kidney disease: Secondary | ICD-10-CM | POA: Diagnosis not present

## 2020-03-11 DIAGNOSIS — R918 Other nonspecific abnormal finding of lung field: Secondary | ICD-10-CM | POA: Diagnosis not present

## 2020-03-11 DIAGNOSIS — U071 COVID-19: Secondary | ICD-10-CM | POA: Diagnosis not present

## 2020-03-11 DIAGNOSIS — J9601 Acute respiratory failure with hypoxia: Secondary | ICD-10-CM | POA: Diagnosis not present

## 2020-03-11 DIAGNOSIS — Z452 Encounter for adjustment and management of vascular access device: Secondary | ICD-10-CM | POA: Diagnosis not present

## 2020-03-11 DIAGNOSIS — R0902 Hypoxemia: Secondary | ICD-10-CM | POA: Diagnosis not present

## 2020-03-11 DIAGNOSIS — R6520 Severe sepsis without septic shock: Secondary | ICD-10-CM | POA: Diagnosis not present

## 2020-03-11 DIAGNOSIS — G9340 Encephalopathy, unspecified: Secondary | ICD-10-CM | POA: Diagnosis not present

## 2020-03-11 DIAGNOSIS — Z4682 Encounter for fitting and adjustment of non-vascular catheter: Secondary | ICD-10-CM | POA: Diagnosis not present

## 2020-03-11 DIAGNOSIS — I120 Hypertensive chronic kidney disease with stage 5 chronic kidney disease or end stage renal disease: Secondary | ICD-10-CM | POA: Diagnosis not present

## 2020-03-11 DIAGNOSIS — G92 Toxic encephalopathy: Secondary | ICD-10-CM | POA: Diagnosis not present

## 2020-03-11 DIAGNOSIS — A4189 Other specified sepsis: Secondary | ICD-10-CM | POA: Diagnosis not present

## 2020-03-11 DIAGNOSIS — N186 End stage renal disease: Secondary | ICD-10-CM | POA: Diagnosis not present

## 2020-03-11 DIAGNOSIS — R6521 Severe sepsis with septic shock: Secondary | ICD-10-CM | POA: Diagnosis not present

## 2020-03-11 DIAGNOSIS — Z992 Dependence on renal dialysis: Secondary | ICD-10-CM | POA: Diagnosis not present

## 2020-03-11 DIAGNOSIS — J159 Unspecified bacterial pneumonia: Secondary | ICD-10-CM | POA: Diagnosis not present

## 2020-03-12 DIAGNOSIS — J9601 Acute respiratory failure with hypoxia: Secondary | ICD-10-CM | POA: Diagnosis not present

## 2020-03-12 DIAGNOSIS — A4189 Other specified sepsis: Secondary | ICD-10-CM | POA: Diagnosis not present

## 2020-03-12 DIAGNOSIS — I120 Hypertensive chronic kidney disease with stage 5 chronic kidney disease or end stage renal disease: Secondary | ICD-10-CM | POA: Diagnosis not present

## 2020-03-12 DIAGNOSIS — N186 End stage renal disease: Secondary | ICD-10-CM | POA: Diagnosis not present

## 2020-03-12 DIAGNOSIS — U071 COVID-19: Secondary | ICD-10-CM | POA: Diagnosis not present

## 2020-03-12 DIAGNOSIS — J159 Unspecified bacterial pneumonia: Secondary | ICD-10-CM | POA: Diagnosis not present

## 2020-03-12 DIAGNOSIS — Z992 Dependence on renal dialysis: Secondary | ICD-10-CM | POA: Diagnosis not present

## 2020-03-12 DIAGNOSIS — E1122 Type 2 diabetes mellitus with diabetic chronic kidney disease: Secondary | ICD-10-CM | POA: Diagnosis not present

## 2020-03-12 DIAGNOSIS — R6520 Severe sepsis without septic shock: Secondary | ICD-10-CM | POA: Diagnosis not present

## 2020-03-12 DIAGNOSIS — G9340 Encephalopathy, unspecified: Secondary | ICD-10-CM | POA: Diagnosis not present

## 2020-03-13 DIAGNOSIS — G9340 Encephalopathy, unspecified: Secondary | ICD-10-CM | POA: Diagnosis not present

## 2020-03-13 DIAGNOSIS — U071 COVID-19: Secondary | ICD-10-CM | POA: Diagnosis not present

## 2020-03-13 DIAGNOSIS — N186 End stage renal disease: Secondary | ICD-10-CM | POA: Diagnosis not present

## 2020-03-13 DIAGNOSIS — R6520 Severe sepsis without septic shock: Secondary | ICD-10-CM | POA: Diagnosis not present

## 2020-03-13 DIAGNOSIS — Z992 Dependence on renal dialysis: Secondary | ICD-10-CM | POA: Diagnosis not present

## 2020-03-13 DIAGNOSIS — I120 Hypertensive chronic kidney disease with stage 5 chronic kidney disease or end stage renal disease: Secondary | ICD-10-CM | POA: Diagnosis not present

## 2020-03-13 DIAGNOSIS — J9601 Acute respiratory failure with hypoxia: Secondary | ICD-10-CM | POA: Diagnosis not present

## 2020-03-13 DIAGNOSIS — A4189 Other specified sepsis: Secondary | ICD-10-CM | POA: Diagnosis not present

## 2020-03-14 DIAGNOSIS — U071 COVID-19: Secondary | ICD-10-CM | POA: Diagnosis not present

## 2020-03-14 DIAGNOSIS — N186 End stage renal disease: Secondary | ICD-10-CM | POA: Diagnosis not present

## 2020-03-14 DIAGNOSIS — Z992 Dependence on renal dialysis: Secondary | ICD-10-CM | POA: Diagnosis not present

## 2020-03-14 DIAGNOSIS — R6520 Severe sepsis without septic shock: Secondary | ICD-10-CM | POA: Diagnosis not present

## 2020-03-14 DIAGNOSIS — A419 Sepsis, unspecified organism: Secondary | ICD-10-CM | POA: Diagnosis not present

## 2020-03-14 DIAGNOSIS — I120 Hypertensive chronic kidney disease with stage 5 chronic kidney disease or end stage renal disease: Secondary | ICD-10-CM | POA: Diagnosis not present

## 2020-03-14 DIAGNOSIS — J9601 Acute respiratory failure with hypoxia: Secondary | ICD-10-CM | POA: Diagnosis not present

## 2020-03-15 DIAGNOSIS — U071 COVID-19: Secondary | ICD-10-CM | POA: Diagnosis not present

## 2020-03-15 DIAGNOSIS — K7460 Unspecified cirrhosis of liver: Secondary | ICD-10-CM | POA: Diagnosis not present

## 2020-03-15 DIAGNOSIS — E785 Hyperlipidemia, unspecified: Secondary | ICD-10-CM | POA: Diagnosis not present

## 2020-03-15 DIAGNOSIS — E1122 Type 2 diabetes mellitus with diabetic chronic kidney disease: Secondary | ICD-10-CM | POA: Diagnosis not present

## 2020-03-15 DIAGNOSIS — D631 Anemia in chronic kidney disease: Secondary | ICD-10-CM | POA: Diagnosis not present

## 2020-03-15 DIAGNOSIS — Z992 Dependence on renal dialysis: Secondary | ICD-10-CM | POA: Diagnosis not present

## 2020-03-15 DIAGNOSIS — J9601 Acute respiratory failure with hypoxia: Secondary | ICD-10-CM | POA: Diagnosis not present

## 2020-03-15 DIAGNOSIS — N186 End stage renal disease: Secondary | ICD-10-CM | POA: Diagnosis not present

## 2020-03-15 DIAGNOSIS — I120 Hypertensive chronic kidney disease with stage 5 chronic kidney disease or end stage renal disease: Secondary | ICD-10-CM | POA: Diagnosis not present

## 2020-03-16 DIAGNOSIS — Z992 Dependence on renal dialysis: Secondary | ICD-10-CM | POA: Diagnosis not present

## 2020-03-16 DIAGNOSIS — N186 End stage renal disease: Secondary | ICD-10-CM | POA: Diagnosis not present

## 2020-03-16 DIAGNOSIS — D631 Anemia in chronic kidney disease: Secondary | ICD-10-CM | POA: Diagnosis not present

## 2020-03-16 DIAGNOSIS — K7460 Unspecified cirrhosis of liver: Secondary | ICD-10-CM | POA: Diagnosis not present

## 2020-03-16 DIAGNOSIS — J9601 Acute respiratory failure with hypoxia: Secondary | ICD-10-CM | POA: Diagnosis not present

## 2020-03-16 DIAGNOSIS — I120 Hypertensive chronic kidney disease with stage 5 chronic kidney disease or end stage renal disease: Secondary | ICD-10-CM | POA: Diagnosis not present

## 2020-03-16 DIAGNOSIS — E785 Hyperlipidemia, unspecified: Secondary | ICD-10-CM | POA: Diagnosis not present

## 2020-03-16 DIAGNOSIS — U071 COVID-19: Secondary | ICD-10-CM | POA: Diagnosis not present

## 2020-03-16 DIAGNOSIS — E1122 Type 2 diabetes mellitus with diabetic chronic kidney disease: Secondary | ICD-10-CM | POA: Diagnosis not present

## 2020-03-16 DIAGNOSIS — K219 Gastro-esophageal reflux disease without esophagitis: Secondary | ICD-10-CM | POA: Diagnosis not present

## 2020-03-17 DIAGNOSIS — Z9981 Dependence on supplemental oxygen: Secondary | ICD-10-CM | POA: Diagnosis not present

## 2020-03-17 DIAGNOSIS — D631 Anemia in chronic kidney disease: Secondary | ICD-10-CM | POA: Diagnosis not present

## 2020-03-17 DIAGNOSIS — U071 COVID-19: Secondary | ICD-10-CM | POA: Diagnosis not present

## 2020-03-17 DIAGNOSIS — Z992 Dependence on renal dialysis: Secondary | ICD-10-CM | POA: Diagnosis not present

## 2020-03-17 DIAGNOSIS — J1282 Pneumonia due to coronavirus disease 2019: Secondary | ICD-10-CM | POA: Diagnosis not present

## 2020-03-17 DIAGNOSIS — I120 Hypertensive chronic kidney disease with stage 5 chronic kidney disease or end stage renal disease: Secondary | ICD-10-CM | POA: Diagnosis not present

## 2020-03-17 DIAGNOSIS — J9601 Acute respiratory failure with hypoxia: Secondary | ICD-10-CM | POA: Diagnosis not present

## 2020-03-17 DIAGNOSIS — N186 End stage renal disease: Secondary | ICD-10-CM | POA: Diagnosis not present

## 2020-03-18 DIAGNOSIS — Z8673 Personal history of transient ischemic attack (TIA), and cerebral infarction without residual deficits: Secondary | ICD-10-CM | POA: Diagnosis not present

## 2020-03-18 DIAGNOSIS — Z9981 Dependence on supplemental oxygen: Secondary | ICD-10-CM | POA: Diagnosis not present

## 2020-03-18 DIAGNOSIS — R4182 Altered mental status, unspecified: Secondary | ICD-10-CM | POA: Diagnosis not present

## 2020-03-18 DIAGNOSIS — U071 COVID-19: Secondary | ICD-10-CM | POA: Diagnosis not present

## 2020-03-18 DIAGNOSIS — Z794 Long term (current) use of insulin: Secondary | ICD-10-CM | POA: Diagnosis not present

## 2020-03-18 DIAGNOSIS — Z992 Dependence on renal dialysis: Secondary | ICD-10-CM | POA: Diagnosis not present

## 2020-03-18 DIAGNOSIS — I6782 Cerebral ischemia: Secondary | ICD-10-CM | POA: Diagnosis not present

## 2020-03-18 DIAGNOSIS — G319 Degenerative disease of nervous system, unspecified: Secondary | ICD-10-CM | POA: Diagnosis not present

## 2020-03-18 DIAGNOSIS — H748X1 Other specified disorders of right middle ear and mastoid: Secondary | ICD-10-CM | POA: Diagnosis not present

## 2020-03-18 DIAGNOSIS — N186 End stage renal disease: Secondary | ICD-10-CM | POA: Diagnosis not present

## 2020-03-18 DIAGNOSIS — R0602 Shortness of breath: Secondary | ICD-10-CM | POA: Diagnosis not present

## 2020-03-18 DIAGNOSIS — J1282 Pneumonia due to coronavirus disease 2019: Secondary | ICD-10-CM | POA: Diagnosis not present

## 2020-03-18 DIAGNOSIS — J9601 Acute respiratory failure with hypoxia: Secondary | ICD-10-CM | POA: Diagnosis not present

## 2020-03-18 DIAGNOSIS — E1122 Type 2 diabetes mellitus with diabetic chronic kidney disease: Secondary | ICD-10-CM | POA: Diagnosis not present

## 2020-03-18 DIAGNOSIS — I120 Hypertensive chronic kidney disease with stage 5 chronic kidney disease or end stage renal disease: Secondary | ICD-10-CM | POA: Diagnosis not present

## 2020-03-18 DIAGNOSIS — J90 Pleural effusion, not elsewhere classified: Secondary | ICD-10-CM | POA: Diagnosis not present

## 2020-03-19 DIAGNOSIS — R4182 Altered mental status, unspecified: Secondary | ICD-10-CM | POA: Diagnosis not present

## 2020-03-19 DIAGNOSIS — U071 COVID-19: Secondary | ICD-10-CM | POA: Diagnosis not present

## 2020-03-19 DIAGNOSIS — Z992 Dependence on renal dialysis: Secondary | ICD-10-CM | POA: Diagnosis not present

## 2020-03-19 DIAGNOSIS — E1122 Type 2 diabetes mellitus with diabetic chronic kidney disease: Secondary | ICD-10-CM | POA: Diagnosis not present

## 2020-03-19 DIAGNOSIS — N186 End stage renal disease: Secondary | ICD-10-CM | POA: Diagnosis not present

## 2020-03-19 DIAGNOSIS — E876 Hypokalemia: Secondary | ICD-10-CM | POA: Diagnosis not present

## 2020-03-19 DIAGNOSIS — J9601 Acute respiratory failure with hypoxia: Secondary | ICD-10-CM | POA: Diagnosis not present

## 2020-03-19 DIAGNOSIS — D631 Anemia in chronic kidney disease: Secondary | ICD-10-CM | POA: Diagnosis not present

## 2020-03-19 DIAGNOSIS — I120 Hypertensive chronic kidney disease with stage 5 chronic kidney disease or end stage renal disease: Secondary | ICD-10-CM | POA: Diagnosis not present

## 2020-03-19 DIAGNOSIS — E872 Acidosis: Secondary | ICD-10-CM | POA: Diagnosis not present

## 2020-03-20 DIAGNOSIS — E872 Acidosis: Secondary | ICD-10-CM | POA: Diagnosis not present

## 2020-03-20 DIAGNOSIS — Z992 Dependence on renal dialysis: Secondary | ICD-10-CM | POA: Diagnosis not present

## 2020-03-20 DIAGNOSIS — R4182 Altered mental status, unspecified: Secondary | ICD-10-CM | POA: Diagnosis not present

## 2020-03-20 DIAGNOSIS — E1122 Type 2 diabetes mellitus with diabetic chronic kidney disease: Secondary | ICD-10-CM | POA: Diagnosis not present

## 2020-03-20 DIAGNOSIS — I120 Hypertensive chronic kidney disease with stage 5 chronic kidney disease or end stage renal disease: Secondary | ICD-10-CM | POA: Diagnosis not present

## 2020-03-20 DIAGNOSIS — J9601 Acute respiratory failure with hypoxia: Secondary | ICD-10-CM | POA: Diagnosis not present

## 2020-03-20 DIAGNOSIS — N186 End stage renal disease: Secondary | ICD-10-CM | POA: Diagnosis not present

## 2020-03-20 DIAGNOSIS — E876 Hypokalemia: Secondary | ICD-10-CM | POA: Diagnosis not present

## 2020-03-20 DIAGNOSIS — U071 COVID-19: Secondary | ICD-10-CM | POA: Diagnosis not present

## 2020-03-21 DIAGNOSIS — J9601 Acute respiratory failure with hypoxia: Secondary | ICD-10-CM | POA: Diagnosis not present

## 2020-03-21 DIAGNOSIS — Z992 Dependence on renal dialysis: Secondary | ICD-10-CM | POA: Diagnosis not present

## 2020-03-21 DIAGNOSIS — E1122 Type 2 diabetes mellitus with diabetic chronic kidney disease: Secondary | ICD-10-CM | POA: Diagnosis not present

## 2020-03-21 DIAGNOSIS — R4182 Altered mental status, unspecified: Secondary | ICD-10-CM | POA: Diagnosis not present

## 2020-03-21 DIAGNOSIS — R1310 Dysphagia, unspecified: Secondary | ICD-10-CM | POA: Diagnosis not present

## 2020-03-21 DIAGNOSIS — I120 Hypertensive chronic kidney disease with stage 5 chronic kidney disease or end stage renal disease: Secondary | ICD-10-CM | POA: Diagnosis not present

## 2020-03-21 DIAGNOSIS — E876 Hypokalemia: Secondary | ICD-10-CM | POA: Diagnosis not present

## 2020-03-21 DIAGNOSIS — N186 End stage renal disease: Secondary | ICD-10-CM | POA: Diagnosis not present

## 2020-03-21 DIAGNOSIS — U071 COVID-19: Secondary | ICD-10-CM | POA: Diagnosis not present

## 2020-03-22 DIAGNOSIS — E1122 Type 2 diabetes mellitus with diabetic chronic kidney disease: Secondary | ICD-10-CM | POA: Diagnosis not present

## 2020-03-22 DIAGNOSIS — Z794 Long term (current) use of insulin: Secondary | ICD-10-CM | POA: Diagnosis not present

## 2020-03-22 DIAGNOSIS — J9601 Acute respiratory failure with hypoxia: Secondary | ICD-10-CM | POA: Diagnosis not present

## 2020-03-22 DIAGNOSIS — Z992 Dependence on renal dialysis: Secondary | ICD-10-CM | POA: Diagnosis not present

## 2020-03-22 DIAGNOSIS — U071 COVID-19: Secondary | ICD-10-CM | POA: Diagnosis not present

## 2020-03-22 DIAGNOSIS — E870 Hyperosmolality and hypernatremia: Secondary | ICD-10-CM | POA: Diagnosis not present

## 2020-03-22 DIAGNOSIS — J1282 Pneumonia due to coronavirus disease 2019: Secondary | ICD-10-CM | POA: Diagnosis not present

## 2020-03-22 DIAGNOSIS — I120 Hypertensive chronic kidney disease with stage 5 chronic kidney disease or end stage renal disease: Secondary | ICD-10-CM | POA: Diagnosis not present

## 2020-03-22 DIAGNOSIS — N186 End stage renal disease: Secondary | ICD-10-CM | POA: Diagnosis not present

## 2020-03-22 DIAGNOSIS — E1165 Type 2 diabetes mellitus with hyperglycemia: Secondary | ICD-10-CM | POA: Diagnosis not present

## 2020-03-23 DIAGNOSIS — J9601 Acute respiratory failure with hypoxia: Secondary | ICD-10-CM | POA: Diagnosis not present

## 2020-03-23 DIAGNOSIS — Z794 Long term (current) use of insulin: Secondary | ICD-10-CM | POA: Diagnosis not present

## 2020-03-23 DIAGNOSIS — E876 Hypokalemia: Secondary | ICD-10-CM | POA: Diagnosis not present

## 2020-03-23 DIAGNOSIS — N186 End stage renal disease: Secondary | ICD-10-CM | POA: Diagnosis not present

## 2020-03-23 DIAGNOSIS — E43 Unspecified severe protein-calorie malnutrition: Secondary | ICD-10-CM | POA: Diagnosis not present

## 2020-03-23 DIAGNOSIS — I120 Hypertensive chronic kidney disease with stage 5 chronic kidney disease or end stage renal disease: Secondary | ICD-10-CM | POA: Diagnosis not present

## 2020-03-23 DIAGNOSIS — U071 COVID-19: Secondary | ICD-10-CM | POA: Diagnosis not present

## 2020-03-23 DIAGNOSIS — Z992 Dependence on renal dialysis: Secondary | ICD-10-CM | POA: Diagnosis not present

## 2020-03-23 DIAGNOSIS — E1122 Type 2 diabetes mellitus with diabetic chronic kidney disease: Secondary | ICD-10-CM | POA: Diagnosis not present

## 2020-03-24 DIAGNOSIS — N186 End stage renal disease: Secondary | ICD-10-CM | POA: Diagnosis not present

## 2020-03-24 DIAGNOSIS — E1122 Type 2 diabetes mellitus with diabetic chronic kidney disease: Secondary | ICD-10-CM | POA: Diagnosis not present

## 2020-03-24 DIAGNOSIS — E43 Unspecified severe protein-calorie malnutrition: Secondary | ICD-10-CM | POA: Diagnosis not present

## 2020-03-24 DIAGNOSIS — I120 Hypertensive chronic kidney disease with stage 5 chronic kidney disease or end stage renal disease: Secondary | ICD-10-CM | POA: Diagnosis not present

## 2020-03-24 DIAGNOSIS — U071 COVID-19: Secondary | ICD-10-CM | POA: Diagnosis not present

## 2020-03-24 DIAGNOSIS — E876 Hypokalemia: Secondary | ICD-10-CM | POA: Diagnosis not present

## 2020-03-24 DIAGNOSIS — J9601 Acute respiratory failure with hypoxia: Secondary | ICD-10-CM | POA: Diagnosis not present

## 2020-03-24 DIAGNOSIS — Z992 Dependence on renal dialysis: Secondary | ICD-10-CM | POA: Diagnosis not present

## 2020-03-25 DIAGNOSIS — Z992 Dependence on renal dialysis: Secondary | ICD-10-CM | POA: Diagnosis not present

## 2020-03-25 DIAGNOSIS — N186 End stage renal disease: Secondary | ICD-10-CM | POA: Diagnosis not present

## 2020-03-25 DIAGNOSIS — U071 COVID-19: Secondary | ICD-10-CM | POA: Diagnosis not present

## 2020-03-25 DIAGNOSIS — J9601 Acute respiratory failure with hypoxia: Secondary | ICD-10-CM | POA: Diagnosis not present

## 2020-03-25 DIAGNOSIS — J1282 Pneumonia due to coronavirus disease 2019: Secondary | ICD-10-CM | POA: Diagnosis not present

## 2020-03-25 DIAGNOSIS — I120 Hypertensive chronic kidney disease with stage 5 chronic kidney disease or end stage renal disease: Secondary | ICD-10-CM | POA: Diagnosis not present

## 2020-03-25 DIAGNOSIS — E876 Hypokalemia: Secondary | ICD-10-CM | POA: Diagnosis not present

## 2020-03-25 DIAGNOSIS — E119 Type 2 diabetes mellitus without complications: Secondary | ICD-10-CM | POA: Diagnosis not present

## 2020-03-25 DIAGNOSIS — E1122 Type 2 diabetes mellitus with diabetic chronic kidney disease: Secondary | ICD-10-CM | POA: Diagnosis not present

## 2020-03-26 DIAGNOSIS — E119 Type 2 diabetes mellitus without complications: Secondary | ICD-10-CM | POA: Diagnosis not present

## 2020-03-26 DIAGNOSIS — J9601 Acute respiratory failure with hypoxia: Secondary | ICD-10-CM | POA: Diagnosis not present

## 2020-03-26 DIAGNOSIS — N186 End stage renal disease: Secondary | ICD-10-CM | POA: Diagnosis not present

## 2020-03-26 DIAGNOSIS — N2581 Secondary hyperparathyroidism of renal origin: Secondary | ICD-10-CM | POA: Diagnosis not present

## 2020-03-26 DIAGNOSIS — R0602 Shortness of breath: Secondary | ICD-10-CM | POA: Diagnosis not present

## 2020-03-26 DIAGNOSIS — Z992 Dependence on renal dialysis: Secondary | ICD-10-CM | POA: Diagnosis not present

## 2020-03-26 DIAGNOSIS — E876 Hypokalemia: Secondary | ICD-10-CM | POA: Diagnosis not present

## 2020-03-26 DIAGNOSIS — Z23 Encounter for immunization: Secondary | ICD-10-CM | POA: Diagnosis not present

## 2020-03-26 DIAGNOSIS — J1282 Pneumonia due to coronavirus disease 2019: Secondary | ICD-10-CM | POA: Diagnosis not present

## 2020-03-26 DIAGNOSIS — E1122 Type 2 diabetes mellitus with diabetic chronic kidney disease: Secondary | ICD-10-CM | POA: Diagnosis not present

## 2020-03-26 DIAGNOSIS — M2550 Pain in unspecified joint: Secondary | ICD-10-CM | POA: Diagnosis not present

## 2020-03-26 DIAGNOSIS — D631 Anemia in chronic kidney disease: Secondary | ICD-10-CM | POA: Diagnosis not present

## 2020-03-26 DIAGNOSIS — Z7401 Bed confinement status: Secondary | ICD-10-CM | POA: Diagnosis not present

## 2020-03-26 DIAGNOSIS — D509 Iron deficiency anemia, unspecified: Secondary | ICD-10-CM | POA: Diagnosis not present

## 2020-03-26 DIAGNOSIS — U071 COVID-19: Secondary | ICD-10-CM | POA: Diagnosis not present

## 2020-03-26 DIAGNOSIS — I120 Hypertensive chronic kidney disease with stage 5 chronic kidney disease or end stage renal disease: Secondary | ICD-10-CM | POA: Diagnosis not present

## 2020-03-26 DIAGNOSIS — R531 Weakness: Secondary | ICD-10-CM | POA: Diagnosis not present

## 2020-03-27 DIAGNOSIS — Z23 Encounter for immunization: Secondary | ICD-10-CM | POA: Diagnosis not present

## 2020-03-27 DIAGNOSIS — N186 End stage renal disease: Secondary | ICD-10-CM | POA: Diagnosis not present

## 2020-03-27 DIAGNOSIS — N2581 Secondary hyperparathyroidism of renal origin: Secondary | ICD-10-CM | POA: Diagnosis not present

## 2020-03-27 DIAGNOSIS — D631 Anemia in chronic kidney disease: Secondary | ICD-10-CM | POA: Diagnosis not present

## 2020-03-27 DIAGNOSIS — D509 Iron deficiency anemia, unspecified: Secondary | ICD-10-CM | POA: Diagnosis not present

## 2020-03-28 DIAGNOSIS — N2581 Secondary hyperparathyroidism of renal origin: Secondary | ICD-10-CM | POA: Diagnosis not present

## 2020-03-28 DIAGNOSIS — N186 End stage renal disease: Secondary | ICD-10-CM | POA: Diagnosis not present

## 2020-03-28 DIAGNOSIS — D631 Anemia in chronic kidney disease: Secondary | ICD-10-CM | POA: Diagnosis not present

## 2020-03-28 DIAGNOSIS — Z23 Encounter for immunization: Secondary | ICD-10-CM | POA: Diagnosis not present

## 2020-03-28 DIAGNOSIS — D509 Iron deficiency anemia, unspecified: Secondary | ICD-10-CM | POA: Diagnosis not present

## 2020-03-29 DIAGNOSIS — U071 COVID-19: Secondary | ICD-10-CM | POA: Diagnosis not present

## 2020-03-29 DIAGNOSIS — Z23 Encounter for immunization: Secondary | ICD-10-CM | POA: Diagnosis not present

## 2020-03-29 DIAGNOSIS — D509 Iron deficiency anemia, unspecified: Secondary | ICD-10-CM | POA: Diagnosis not present

## 2020-03-29 DIAGNOSIS — L89312 Pressure ulcer of right buttock, stage 2: Secondary | ICD-10-CM | POA: Diagnosis not present

## 2020-03-29 DIAGNOSIS — E559 Vitamin D deficiency, unspecified: Secondary | ICD-10-CM | POA: Diagnosis not present

## 2020-03-29 DIAGNOSIS — E782 Mixed hyperlipidemia: Secondary | ICD-10-CM | POA: Diagnosis not present

## 2020-03-29 DIAGNOSIS — N186 End stage renal disease: Secondary | ICD-10-CM | POA: Diagnosis not present

## 2020-03-29 DIAGNOSIS — E1122 Type 2 diabetes mellitus with diabetic chronic kidney disease: Secondary | ICD-10-CM | POA: Diagnosis not present

## 2020-03-29 DIAGNOSIS — N2581 Secondary hyperparathyroidism of renal origin: Secondary | ICD-10-CM | POA: Diagnosis not present

## 2020-03-29 DIAGNOSIS — I120 Hypertensive chronic kidney disease with stage 5 chronic kidney disease or end stage renal disease: Secondary | ICD-10-CM | POA: Diagnosis not present

## 2020-03-29 DIAGNOSIS — D631 Anemia in chronic kidney disease: Secondary | ICD-10-CM | POA: Diagnosis not present

## 2020-03-29 DIAGNOSIS — E1140 Type 2 diabetes mellitus with diabetic neuropathy, unspecified: Secondary | ICD-10-CM | POA: Diagnosis not present

## 2020-03-30 DIAGNOSIS — N186 End stage renal disease: Secondary | ICD-10-CM | POA: Diagnosis not present

## 2020-03-30 DIAGNOSIS — N2581 Secondary hyperparathyroidism of renal origin: Secondary | ICD-10-CM | POA: Diagnosis not present

## 2020-03-30 DIAGNOSIS — D509 Iron deficiency anemia, unspecified: Secondary | ICD-10-CM | POA: Diagnosis not present

## 2020-03-30 DIAGNOSIS — D631 Anemia in chronic kidney disease: Secondary | ICD-10-CM | POA: Diagnosis not present

## 2020-03-30 DIAGNOSIS — Z23 Encounter for immunization: Secondary | ICD-10-CM | POA: Diagnosis not present

## 2020-03-31 DIAGNOSIS — N2581 Secondary hyperparathyroidism of renal origin: Secondary | ICD-10-CM | POA: Diagnosis not present

## 2020-03-31 DIAGNOSIS — D631 Anemia in chronic kidney disease: Secondary | ICD-10-CM | POA: Diagnosis not present

## 2020-03-31 DIAGNOSIS — N186 End stage renal disease: Secondary | ICD-10-CM | POA: Diagnosis not present

## 2020-03-31 DIAGNOSIS — D509 Iron deficiency anemia, unspecified: Secondary | ICD-10-CM | POA: Diagnosis not present

## 2020-03-31 DIAGNOSIS — Z23 Encounter for immunization: Secondary | ICD-10-CM | POA: Diagnosis not present

## 2020-04-01 DIAGNOSIS — E559 Vitamin D deficiency, unspecified: Secondary | ICD-10-CM | POA: Diagnosis not present

## 2020-04-01 DIAGNOSIS — N186 End stage renal disease: Secondary | ICD-10-CM | POA: Diagnosis not present

## 2020-04-01 DIAGNOSIS — D631 Anemia in chronic kidney disease: Secondary | ICD-10-CM | POA: Diagnosis not present

## 2020-04-01 DIAGNOSIS — L89312 Pressure ulcer of right buttock, stage 2: Secondary | ICD-10-CM | POA: Diagnosis not present

## 2020-04-01 DIAGNOSIS — U071 COVID-19: Secondary | ICD-10-CM | POA: Diagnosis not present

## 2020-04-01 DIAGNOSIS — Z23 Encounter for immunization: Secondary | ICD-10-CM | POA: Diagnosis not present

## 2020-04-01 DIAGNOSIS — N2581 Secondary hyperparathyroidism of renal origin: Secondary | ICD-10-CM | POA: Diagnosis not present

## 2020-04-01 DIAGNOSIS — E1122 Type 2 diabetes mellitus with diabetic chronic kidney disease: Secondary | ICD-10-CM | POA: Diagnosis not present

## 2020-04-01 DIAGNOSIS — E1140 Type 2 diabetes mellitus with diabetic neuropathy, unspecified: Secondary | ICD-10-CM | POA: Diagnosis not present

## 2020-04-01 DIAGNOSIS — D509 Iron deficiency anemia, unspecified: Secondary | ICD-10-CM | POA: Diagnosis not present

## 2020-04-01 DIAGNOSIS — I120 Hypertensive chronic kidney disease with stage 5 chronic kidney disease or end stage renal disease: Secondary | ICD-10-CM | POA: Diagnosis not present

## 2020-04-01 DIAGNOSIS — E782 Mixed hyperlipidemia: Secondary | ICD-10-CM | POA: Diagnosis not present

## 2020-04-02 DIAGNOSIS — N2581 Secondary hyperparathyroidism of renal origin: Secondary | ICD-10-CM | POA: Diagnosis not present

## 2020-04-02 DIAGNOSIS — I120 Hypertensive chronic kidney disease with stage 5 chronic kidney disease or end stage renal disease: Secondary | ICD-10-CM | POA: Diagnosis not present

## 2020-04-02 DIAGNOSIS — D631 Anemia in chronic kidney disease: Secondary | ICD-10-CM | POA: Diagnosis not present

## 2020-04-02 DIAGNOSIS — U071 COVID-19: Secondary | ICD-10-CM | POA: Diagnosis not present

## 2020-04-02 DIAGNOSIS — E1140 Type 2 diabetes mellitus with diabetic neuropathy, unspecified: Secondary | ICD-10-CM | POA: Diagnosis not present

## 2020-04-02 DIAGNOSIS — E559 Vitamin D deficiency, unspecified: Secondary | ICD-10-CM | POA: Diagnosis not present

## 2020-04-02 DIAGNOSIS — E1122 Type 2 diabetes mellitus with diabetic chronic kidney disease: Secondary | ICD-10-CM | POA: Diagnosis not present

## 2020-04-02 DIAGNOSIS — N186 End stage renal disease: Secondary | ICD-10-CM | POA: Diagnosis not present

## 2020-04-02 DIAGNOSIS — L89312 Pressure ulcer of right buttock, stage 2: Secondary | ICD-10-CM | POA: Diagnosis not present

## 2020-04-02 DIAGNOSIS — E782 Mixed hyperlipidemia: Secondary | ICD-10-CM | POA: Diagnosis not present

## 2020-04-02 DIAGNOSIS — D509 Iron deficiency anemia, unspecified: Secondary | ICD-10-CM | POA: Diagnosis not present

## 2020-04-02 DIAGNOSIS — Z23 Encounter for immunization: Secondary | ICD-10-CM | POA: Diagnosis not present

## 2020-04-03 DIAGNOSIS — N2581 Secondary hyperparathyroidism of renal origin: Secondary | ICD-10-CM | POA: Diagnosis not present

## 2020-04-03 DIAGNOSIS — D509 Iron deficiency anemia, unspecified: Secondary | ICD-10-CM | POA: Diagnosis not present

## 2020-04-03 DIAGNOSIS — Z23 Encounter for immunization: Secondary | ICD-10-CM | POA: Diagnosis not present

## 2020-04-03 DIAGNOSIS — D631 Anemia in chronic kidney disease: Secondary | ICD-10-CM | POA: Diagnosis not present

## 2020-04-03 DIAGNOSIS — N186 End stage renal disease: Secondary | ICD-10-CM | POA: Diagnosis not present

## 2020-04-04 DIAGNOSIS — Z23 Encounter for immunization: Secondary | ICD-10-CM | POA: Diagnosis not present

## 2020-04-04 DIAGNOSIS — D631 Anemia in chronic kidney disease: Secondary | ICD-10-CM | POA: Diagnosis not present

## 2020-04-04 DIAGNOSIS — D509 Iron deficiency anemia, unspecified: Secondary | ICD-10-CM | POA: Diagnosis not present

## 2020-04-04 DIAGNOSIS — N2581 Secondary hyperparathyroidism of renal origin: Secondary | ICD-10-CM | POA: Diagnosis not present

## 2020-04-04 DIAGNOSIS — N186 End stage renal disease: Secondary | ICD-10-CM | POA: Diagnosis not present

## 2020-04-05 DIAGNOSIS — N186 End stage renal disease: Secondary | ICD-10-CM | POA: Diagnosis not present

## 2020-04-05 DIAGNOSIS — Z23 Encounter for immunization: Secondary | ICD-10-CM | POA: Diagnosis not present

## 2020-04-05 DIAGNOSIS — N2581 Secondary hyperparathyroidism of renal origin: Secondary | ICD-10-CM | POA: Diagnosis not present

## 2020-04-05 DIAGNOSIS — D509 Iron deficiency anemia, unspecified: Secondary | ICD-10-CM | POA: Diagnosis not present

## 2020-04-05 DIAGNOSIS — D631 Anemia in chronic kidney disease: Secondary | ICD-10-CM | POA: Diagnosis not present

## 2020-04-05 DIAGNOSIS — J9600 Acute respiratory failure, unspecified whether with hypoxia or hypercapnia: Secondary | ICD-10-CM | POA: Insufficient documentation

## 2020-04-05 NOTE — Progress Notes (Signed)
Subjective:    Patient ID: Mary Valencia, female    DOB: 03-03-1956, 64 y.o.   MRN: [id number]  HPI The patient is here for follow up from the hospital.  She is here with her 2 sons.  Admitted 9/14-10/1.    She was admitted for fever and fatigue.  She was diagnosed with covid-19 associated pneumonia and sepsis.  She had acute hypoxic resp failure.  She was treated with IV remdesivir and dexamethasone.  She received IV Tocilizumab on 9/15.  She completed IV vancomycin and cefepime.  She improved clinically.  She was on oxygen prn.   She has ESRD on PD.  She has DM with hyperglycemia, htn, recent CVA w/ residual r hemiparesis, hyperphosphatemia, hypokalemia.   Her husband died recently and she has moved in with her son and his family.  She has PT, OT, RN coming to the house.  She is using the oxygen as needed.  She monitors her oxygen and it tends to get low when she is doing physical therapy or active.  She can go a whole day without taking it.  Her daughter-in-law's help her with bathing, getting dressed and keeping her clean.  She is eating well-eats 3 meals a day and 3 snacks.  She is a chest bruise she would like looked at.  She states she does have sacral sores.  Her family helps position her and she is working on positioning her.  The nurse at home is coming to monitor this.  She has no concerns.  She feels her energy level and appetite have improved since being home.   Medications and allergies reviewed with patient and updated if appropriate.  Patient Active Problem List   Diagnosis Date Noted  . Acute respiratory failure due to COVID-19 ([place]) 04/05/2020  . Foot ulcer ([place]) 12/24/2019  . Ankle sprain 12/24/2019  . CVA (cerebral vascular accident) ([place]) 09/22/2019  . Laceration of right lower leg 02/26/2019  . Liver cirrhosis ([place]) 09/21/2017  . CAD (coronary artery disease) 09/21/2017  . CRF (chronic renal failure) 10/06/2015  . Situational mixed anxiety and  depressive disorder 10/06/2015  . Hyperkalemia 02/01/2013  . Edema 10/27/2012  . TIA (transient ischemic attack) 03/21/2012  . Encephalopathy acute 01/29/2012  . Weight loss 01/24/2012  . LBP (low back pain) 01/24/2012  . Chest pain, atypical 09/25/2011  . Vagina bleeding 01/18/2011  . ALLERGIC RHINITIS 09/05/2010  . TACHYCARDIA 09/05/2010  . DYSPNEA 09/05/2010  . Diabetes mellitus type 2 with complications ([place]) 36/14/4315  . Dyslipidemia 02/19/2007  . Renal hypertension 02/19/2007  . GERD 02/19/2007  . WEIGHT GAIN 02/19/2007    Current Outpatient Medications on File Prior to Visit  Medication Sig Dispense Refill  . ACCU-CHEK FASTCLIX LANCETS MISC Use to check blood glucose TID (DX: E11.8, Z79.4) 300 each 3  . Alcohol Swabs (B-D SINGLE USE SWABS REGULAR) PADS Use as directed 100 each 3  . atorvastatin (LIPITOR) 40 MG tablet Take 1 tablet (40 mg total) by mouth daily. 90 tablet 3  . Blood Glucose Calibration (ACCU-CHEK SMARTVIEW CONTROL) LIQD Use as directed 1 each 1  . Blood Glucose Monitoring Suppl (ACCU-CHEK NANO SMARTVIEW) w/Device KIT Use to check blood glucose TID (DX: E11.8, Z79.4) 1 kit 0  . calcitRIOL (ROCALTROL) 0.25 MCG capsule Take 0.25 mcg by mouth daily.    . calcium acetate, Phos Binder, (PHOSLYRA) 667 MG/5ML SOLN Take 667 mg by mouth 3 (three) times daily with meals.    . calcium carbonate (TUMS -  DOSED IN MG ELEMENTAL CALCIUM) 500 MG chewable tablet     . CALCIUM-MAGNESIUM-VITAMIN D PO Take 1 tablet by mouth daily.    . cinacalcet (SENSIPAR) 30 MG tablet Take 30 mg by mouth daily.    . Continuous Blood Gluc Sensor (FREESTYLE LIBRE 14 DAY SENSOR) MISC 1 Units by Does not apply route every 14 (fourteen) days. 6 each 3  . diphenhydrAMINE (BENADRYL) 25 mg capsule Take 25 mg by mouth as needed.    Marland Kitchen glucose blood test strip TEST BLOOD GLUCOSE THREE TIMES DAILY 300 each 3  . hydrALAZINE (APRESOLINE) 100 MG tablet Take 1 tablet (100 mg total) by mouth 3 (three) times  daily. 270 tablet 3  . Insulin NPH Human, Isophane, (NOVOLIN N [place]) Inject 2 Doses into the skin. 5 mg in morning, 7 mg at night    . labetalol (NORMODYNE) 200 MG tablet Take 1 tablet (200 mg total) by mouth 3 (three) times daily. 90 tablet 3  . losartan (COZAAR) 50 MG tablet Take 1 tablet (50 mg total) by mouth daily. (Patient taking differently: Take 50 mg by mouth in the morning and at bedtime. ) 90 tablet 3  . mupirocin ointment (BACTROBAN) 2 % On leg wound w/dressing change qd or bid 30 g 1  . NIFEdipine (ADALAT CC) 60 MG 24 hr tablet     . NIFEdipine (PROCARDIA XL/NIFEDICAL XL) 60 MG 24 hr tablet Take 1 tablet (60 mg total) by mouth 2 (two) times daily. 180 tablet 3  . NOVOLOG 100 UNIT/ML injection Inject 7 Units into the skin in the morning and at bedtime. 10 mL 11  . omeprazole (PRILOSEC) 40 MG capsule Take 1 capsule (40 mg total) by mouth daily. 90 capsule 3  . polyethylene glycol (MIRALAX / GLYCOLAX) packet Take 17 g by mouth.    Marland Kitchen PRESCRIPTION MEDICATION 210 mg 3 (three) times daily before meals.     . senna-docusate (SENOKOT-S) 8.6-50 MG tablet Take by mouth 2 (two) times daily. Take 2 tab twice daily.    Marland Kitchen torsemide (DEMADEX) 20 MG tablet Take 40 mg by mouth.     . Continuous Blood Gluc Receiver (FREESTYLE LIBRE 14 DAY READER) DEVI 1 Units by Does not apply route daily. (Patient not taking: Reported on 04/06/2020) 1 each 1   No current facility-administered medications on file prior to visit.    Past Medical History:  Diagnosis Date  . Allergic rhinitis   . 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 Edema 10/27/2012   L>R  . GERD (gastroesophageal reflux disease)   . HTN (hypertension)   . Hyperlipidemia   . Type II or unspecified type diabetes mellitus without mention of complication, not stated as uncontrolled     Past Surgical History:  Procedure Laterality Date  . ABDOMINAL HYSTERECTOMY     partial  . KIDNEY SURGERY     right removed  . NEPHRECTOMY      right - related to untreated strep infection and stones  . TONSILLECTOMY      Social History   Socioeconomic History  . Marital status: Married    Spouse name: Not on file  . Number of children: 5  . Years of education: Not on file  . Highest education level: Not on file  Occupational History  . Occupation: disability  Tobacco Use  . Smoking status: Never Smoker  . Smokeless tobacco: Never Used  Vaping Use  . Vaping Use: Never used  Substance and Sexual  Activity  . Alcohol use: No  . Drug use: No  . Sexual activity: Yes  Other Topics Concern  . 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 Difficulty of Paying Living Expenses: Not on file  Food Insecurity:   . Worried About Programme researcher, broadcasting/film/video in the Last Year: Not on file  . Ran Out of Food in the Last Year: Not on file  Transportation Needs:   . Lack of Transportation (Medical): Not on file  . Lack of Transportation (Non-Medical): Not on file  Physical Activity:   . Days of Exercise per Week: Not on file  . Minutes of Exercise per Session: Not on file  Stress:   . Feeling of Stress : Not on file  Social Connections:   . Frequency of Communication with Friends and Family: Not on file  . Frequency of Social Gatherings with Friends and Family: Not on file  . Attends Religious Services: Not on file  . Active Member of Clubs or Organizations: Not on file  . Attends Banker Meetings: Not on file  . Marital Status: Not on file    Family History  Problem Relation Age of Onset  . Heart disease Mother 21       MI  . Heart disease Father 28       MI  . Alcohol abuse Sister   . Kidney disease Sister   . Heart disease Brother        ?CHF ?CAD  . Heart disease Maternal Aunt   . Diabetes Other     Review of Systems  Constitutional: Negative for appetite change,  chills and fever.  Respiratory: Positive for cough (some - sometimes brings up clear mucus). Negative for shortness of breath and wheezing.   Cardiovascular: Negative for chest pain, palpitations and leg swelling.  Gastrointestinal: Negative for abdominal pain and nausea.  Neurological: Negative for dizziness, light-headedness and headaches.       Objective:   Vitals:   04/06/20 1405  BP: (!) 150/72  Pulse: 81  Temp: 98.8 F (37.1 C)  SpO2: 97%   BP Readings from Last 3 Encounters:  04/06/20 (!) 150/72  01/21/20 (!) 180/100  12/24/19 (!) 190/70   Wt Readings from Last 3 Encounters:  01/21/20 134 lb (60.8 kg)  12/24/19 142 lb (64.4 kg)  09/22/19 141 lb (64 kg)   Body mass index is 24.51 kg/m.   Physical Exam    Constitutional: Appears well-developed and well-nourished. No distress.  Wearing oxygen via nasal cannula HENT:  Head: Normocephalic and atraumatic.  Neck: Neck supple. No tracheal deviation present. No thyromegaly present.  No cervical lymphadenopathy Cardiovascular: Normal rate, regular rhythm and normal heart sounds.   No murmur heard. No carotid bruit .  No edema Pulmonary/Chest: Effort normal and breath sounds normal. No respiratory distress. No has no wheezes. No rales.  Skin: Skin is warm and dry. Not diaphoretic.  Mild erythema under left breast with some sloughing of the skin-nontender, no discharge, area appears slightly moist Psychiatric: Normal mood and affect. Behavior is normal.      Assessment & Plan:    See Problem List for Assessment and Plan of chronic medical problems.    This visit occurred during the SARS-CoV-2 public health emergency.  Safety protocols were in place, including screening  questions prior to the visit, additional usage of staff PPE, and extensive cleaning of exam room while observing appropriate contact time as indicated for disinfecting solutions.

## 2020-04-06 ENCOUNTER — Other Ambulatory Visit: Payer: Self-pay

## 2020-04-06 ENCOUNTER — Ambulatory Visit (INDEPENDENT_AMBULATORY_CARE_PROVIDER_SITE_OTHER): Payer: Medicare HMO | Admitting: Internal Medicine

## 2020-04-06 ENCOUNTER — Encounter: Payer: Self-pay | Admitting: Internal Medicine

## 2020-04-06 VITALS — BP 150/72 | HR 81 | Temp 98.8°F | Ht 62.0 in

## 2020-04-06 DIAGNOSIS — N2581 Secondary hyperparathyroidism of renal origin: Secondary | ICD-10-CM | POA: Diagnosis not present

## 2020-04-06 DIAGNOSIS — E1140 Type 2 diabetes mellitus with diabetic neuropathy, unspecified: Secondary | ICD-10-CM | POA: Diagnosis not present

## 2020-04-06 DIAGNOSIS — D509 Iron deficiency anemia, unspecified: Secondary | ICD-10-CM | POA: Diagnosis not present

## 2020-04-06 DIAGNOSIS — E1122 Type 2 diabetes mellitus with diabetic chronic kidney disease: Secondary | ICD-10-CM | POA: Diagnosis not present

## 2020-04-06 DIAGNOSIS — L89312 Pressure ulcer of right buttock, stage 2: Secondary | ICD-10-CM | POA: Diagnosis not present

## 2020-04-06 DIAGNOSIS — U071 COVID-19: Secondary | ICD-10-CM

## 2020-04-06 DIAGNOSIS — N186 End stage renal disease: Secondary | ICD-10-CM | POA: Diagnosis not present

## 2020-04-06 DIAGNOSIS — E118 Type 2 diabetes mellitus with unspecified complications: Secondary | ICD-10-CM | POA: Diagnosis not present

## 2020-04-06 DIAGNOSIS — L89159 Pressure ulcer of sacral region, unspecified stage: Secondary | ICD-10-CM

## 2020-04-06 DIAGNOSIS — E782 Mixed hyperlipidemia: Secondary | ICD-10-CM | POA: Diagnosis not present

## 2020-04-06 DIAGNOSIS — J9600 Acute respiratory failure, unspecified whether with hypoxia or hypercapnia: Secondary | ICD-10-CM | POA: Diagnosis not present

## 2020-04-06 DIAGNOSIS — L304 Erythema intertrigo: Secondary | ICD-10-CM

## 2020-04-06 DIAGNOSIS — Z23 Encounter for immunization: Secondary | ICD-10-CM | POA: Diagnosis not present

## 2020-04-06 DIAGNOSIS — N185 Chronic kidney disease, stage 5: Secondary | ICD-10-CM

## 2020-04-06 DIAGNOSIS — L8990 Pressure ulcer of unspecified site, unspecified stage: Secondary | ICD-10-CM | POA: Insufficient documentation

## 2020-04-06 DIAGNOSIS — I120 Hypertensive chronic kidney disease with stage 5 chronic kidney disease or end stage renal disease: Secondary | ICD-10-CM | POA: Diagnosis not present

## 2020-04-06 DIAGNOSIS — I129 Hypertensive chronic kidney disease with stage 1 through stage 4 chronic kidney disease, or unspecified chronic kidney disease: Secondary | ICD-10-CM

## 2020-04-06 DIAGNOSIS — E559 Vitamin D deficiency, unspecified: Secondary | ICD-10-CM | POA: Diagnosis not present

## 2020-04-06 DIAGNOSIS — D631 Anemia in chronic kidney disease: Secondary | ICD-10-CM | POA: Diagnosis not present

## 2020-04-06 MED ORDER — TALC EX POWD: CUTANEOUS | 0 refills | Status: DC | PRN

## 2020-04-06 NOTE — Assessment & Plan Note (Signed)
Acute Hospitalized for Covid associated with pneumonia and acute respiratory failure Sent home on oxygen to use as needed.  She is monitoring her oxygen at home and uses the oxygen as needed, especially with exertion Oxygen saturation here is 97% on 2 L via nasal cannula Continue oxygen as needed She does have a home health nurse to help monitor She has a cough sometimes, but denies any shortness of breath, wheezing or chest pain.  Appetite and energy level improving Overall doing well

## 2020-04-06 NOTE — Assessment & Plan Note (Signed)
Acute She does have a pressure sore-I did not look at this today She has a home health RN who is monitoring and helping to treat

## 2020-04-06 NOTE — Assessment & Plan Note (Signed)
Chronic She does monitor her sugars at home Continue current insulin dose-has follow-up with PCP next month

## 2020-04-06 NOTE — Patient Instructions (Signed)
Use the talc powder under the breast as needed - this will help keep the area dry and prevent infection.  Continue your current medications.  If your BP is consistently in the 150's restart the nifedipine.

## 2020-04-06 NOTE — Assessment & Plan Note (Signed)
Acute Mild under left breast No itching, pain or odor Trial of talc powder to help keep the area dry and help prevent infection Will have home health RN monitor

## 2020-04-06 NOTE — Assessment & Plan Note (Signed)
Chronic end-stage renal disease on PD

## 2020-04-06 NOTE — Assessment & Plan Note (Signed)
Chronic Blood pressure has been variable at home-she did bring her blood pressure log in with her and has had a couple of systolic pressures in the 120s and some up to the 150s Her nifedipine and hydralazine have been on hold since her hospitalization-advised her to continue to hold these for now and continue to monitor BP.  If blood pressure is more consistently in the 150s restart nifedipine 60 mg twice daily Continue to hold hydralazine Continue labetalol 200 mg 3 times daily, losartan 50 mg twice daily and torsemide 20 mg twice daily

## 2020-04-07 DIAGNOSIS — D509 Iron deficiency anemia, unspecified: Secondary | ICD-10-CM | POA: Diagnosis not present

## 2020-04-07 DIAGNOSIS — I120 Hypertensive chronic kidney disease with stage 5 chronic kidney disease or end stage renal disease: Secondary | ICD-10-CM | POA: Diagnosis not present

## 2020-04-07 DIAGNOSIS — N186 End stage renal disease: Secondary | ICD-10-CM | POA: Diagnosis not present

## 2020-04-07 DIAGNOSIS — E119 Type 2 diabetes mellitus without complications: Secondary | ICD-10-CM | POA: Diagnosis not present

## 2020-04-07 DIAGNOSIS — Z23 Encounter for immunization: Secondary | ICD-10-CM | POA: Diagnosis not present

## 2020-04-07 DIAGNOSIS — E559 Vitamin D deficiency, unspecified: Secondary | ICD-10-CM | POA: Diagnosis not present

## 2020-04-07 DIAGNOSIS — E1140 Type 2 diabetes mellitus with diabetic neuropathy, unspecified: Secondary | ICD-10-CM | POA: Diagnosis not present

## 2020-04-07 DIAGNOSIS — D631 Anemia in chronic kidney disease: Secondary | ICD-10-CM | POA: Diagnosis not present

## 2020-04-07 DIAGNOSIS — L89312 Pressure ulcer of right buttock, stage 2: Secondary | ICD-10-CM | POA: Diagnosis not present

## 2020-04-07 DIAGNOSIS — U071 COVID-19: Secondary | ICD-10-CM | POA: Diagnosis not present

## 2020-04-07 DIAGNOSIS — E1122 Type 2 diabetes mellitus with diabetic chronic kidney disease: Secondary | ICD-10-CM | POA: Diagnosis not present

## 2020-04-07 DIAGNOSIS — N2581 Secondary hyperparathyroidism of renal origin: Secondary | ICD-10-CM | POA: Diagnosis not present

## 2020-04-07 DIAGNOSIS — E782 Mixed hyperlipidemia: Secondary | ICD-10-CM | POA: Diagnosis not present

## 2020-04-08 DIAGNOSIS — N186 End stage renal disease: Secondary | ICD-10-CM | POA: Diagnosis not present

## 2020-04-08 DIAGNOSIS — L89312 Pressure ulcer of right buttock, stage 2: Secondary | ICD-10-CM | POA: Diagnosis not present

## 2020-04-08 DIAGNOSIS — Z23 Encounter for immunization: Secondary | ICD-10-CM | POA: Diagnosis not present

## 2020-04-08 DIAGNOSIS — E1140 Type 2 diabetes mellitus with diabetic neuropathy, unspecified: Secondary | ICD-10-CM | POA: Diagnosis not present

## 2020-04-08 DIAGNOSIS — N2581 Secondary hyperparathyroidism of renal origin: Secondary | ICD-10-CM | POA: Diagnosis not present

## 2020-04-08 DIAGNOSIS — D509 Iron deficiency anemia, unspecified: Secondary | ICD-10-CM | POA: Diagnosis not present

## 2020-04-08 DIAGNOSIS — E559 Vitamin D deficiency, unspecified: Secondary | ICD-10-CM | POA: Diagnosis not present

## 2020-04-08 DIAGNOSIS — E1122 Type 2 diabetes mellitus with diabetic chronic kidney disease: Secondary | ICD-10-CM | POA: Diagnosis not present

## 2020-04-08 DIAGNOSIS — D631 Anemia in chronic kidney disease: Secondary | ICD-10-CM | POA: Diagnosis not present

## 2020-04-08 DIAGNOSIS — I120 Hypertensive chronic kidney disease with stage 5 chronic kidney disease or end stage renal disease: Secondary | ICD-10-CM | POA: Diagnosis not present

## 2020-04-08 DIAGNOSIS — U071 COVID-19: Secondary | ICD-10-CM | POA: Diagnosis not present

## 2020-04-08 DIAGNOSIS — E782 Mixed hyperlipidemia: Secondary | ICD-10-CM | POA: Diagnosis not present

## 2020-04-09 DIAGNOSIS — Z23 Encounter for immunization: Secondary | ICD-10-CM | POA: Diagnosis not present

## 2020-04-09 DIAGNOSIS — U071 COVID-19: Secondary | ICD-10-CM | POA: Diagnosis not present

## 2020-04-09 DIAGNOSIS — D509 Iron deficiency anemia, unspecified: Secondary | ICD-10-CM | POA: Diagnosis not present

## 2020-04-09 DIAGNOSIS — D631 Anemia in chronic kidney disease: Secondary | ICD-10-CM | POA: Diagnosis not present

## 2020-04-09 DIAGNOSIS — N2581 Secondary hyperparathyroidism of renal origin: Secondary | ICD-10-CM | POA: Diagnosis not present

## 2020-04-09 DIAGNOSIS — E782 Mixed hyperlipidemia: Secondary | ICD-10-CM | POA: Diagnosis not present

## 2020-04-09 DIAGNOSIS — L89312 Pressure ulcer of right buttock, stage 2: Secondary | ICD-10-CM | POA: Diagnosis not present

## 2020-04-09 DIAGNOSIS — I120 Hypertensive chronic kidney disease with stage 5 chronic kidney disease or end stage renal disease: Secondary | ICD-10-CM | POA: Diagnosis not present

## 2020-04-09 DIAGNOSIS — E1122 Type 2 diabetes mellitus with diabetic chronic kidney disease: Secondary | ICD-10-CM | POA: Diagnosis not present

## 2020-04-09 DIAGNOSIS — E559 Vitamin D deficiency, unspecified: Secondary | ICD-10-CM | POA: Diagnosis not present

## 2020-04-09 DIAGNOSIS — N186 End stage renal disease: Secondary | ICD-10-CM | POA: Diagnosis not present

## 2020-04-09 DIAGNOSIS — E1140 Type 2 diabetes mellitus with diabetic neuropathy, unspecified: Secondary | ICD-10-CM | POA: Diagnosis not present

## 2020-04-10 DIAGNOSIS — D509 Iron deficiency anemia, unspecified: Secondary | ICD-10-CM | POA: Diagnosis not present

## 2020-04-10 DIAGNOSIS — Z23 Encounter for immunization: Secondary | ICD-10-CM | POA: Diagnosis not present

## 2020-04-10 DIAGNOSIS — D631 Anemia in chronic kidney disease: Secondary | ICD-10-CM | POA: Diagnosis not present

## 2020-04-10 DIAGNOSIS — N2581 Secondary hyperparathyroidism of renal origin: Secondary | ICD-10-CM | POA: Diagnosis not present

## 2020-04-10 DIAGNOSIS — N186 End stage renal disease: Secondary | ICD-10-CM | POA: Diagnosis not present

## 2020-04-11 DIAGNOSIS — D509 Iron deficiency anemia, unspecified: Secondary | ICD-10-CM | POA: Diagnosis not present

## 2020-04-11 DIAGNOSIS — N186 End stage renal disease: Secondary | ICD-10-CM | POA: Diagnosis not present

## 2020-04-11 DIAGNOSIS — D631 Anemia in chronic kidney disease: Secondary | ICD-10-CM | POA: Diagnosis not present

## 2020-04-11 DIAGNOSIS — L89312 Pressure ulcer of right buttock, stage 2: Secondary | ICD-10-CM | POA: Diagnosis not present

## 2020-04-11 DIAGNOSIS — E1122 Type 2 diabetes mellitus with diabetic chronic kidney disease: Secondary | ICD-10-CM | POA: Diagnosis not present

## 2020-04-11 DIAGNOSIS — E1140 Type 2 diabetes mellitus with diabetic neuropathy, unspecified: Secondary | ICD-10-CM | POA: Diagnosis not present

## 2020-04-11 DIAGNOSIS — E559 Vitamin D deficiency, unspecified: Secondary | ICD-10-CM | POA: Diagnosis not present

## 2020-04-11 DIAGNOSIS — I120 Hypertensive chronic kidney disease with stage 5 chronic kidney disease or end stage renal disease: Secondary | ICD-10-CM | POA: Diagnosis not present

## 2020-04-11 DIAGNOSIS — U071 COVID-19: Secondary | ICD-10-CM | POA: Diagnosis not present

## 2020-04-11 DIAGNOSIS — Z23 Encounter for immunization: Secondary | ICD-10-CM | POA: Diagnosis not present

## 2020-04-11 DIAGNOSIS — N2581 Secondary hyperparathyroidism of renal origin: Secondary | ICD-10-CM | POA: Diagnosis not present

## 2020-04-11 DIAGNOSIS — E782 Mixed hyperlipidemia: Secondary | ICD-10-CM | POA: Diagnosis not present

## 2020-04-12 DIAGNOSIS — E559 Vitamin D deficiency, unspecified: Secondary | ICD-10-CM | POA: Diagnosis not present

## 2020-04-12 DIAGNOSIS — D509 Iron deficiency anemia, unspecified: Secondary | ICD-10-CM | POA: Diagnosis not present

## 2020-04-12 DIAGNOSIS — E1122 Type 2 diabetes mellitus with diabetic chronic kidney disease: Secondary | ICD-10-CM | POA: Diagnosis not present

## 2020-04-12 DIAGNOSIS — N2581 Secondary hyperparathyroidism of renal origin: Secondary | ICD-10-CM | POA: Diagnosis not present

## 2020-04-12 DIAGNOSIS — D631 Anemia in chronic kidney disease: Secondary | ICD-10-CM | POA: Diagnosis not present

## 2020-04-12 DIAGNOSIS — I120 Hypertensive chronic kidney disease with stage 5 chronic kidney disease or end stage renal disease: Secondary | ICD-10-CM | POA: Diagnosis not present

## 2020-04-12 DIAGNOSIS — E1140 Type 2 diabetes mellitus with diabetic neuropathy, unspecified: Secondary | ICD-10-CM | POA: Diagnosis not present

## 2020-04-12 DIAGNOSIS — U071 COVID-19: Secondary | ICD-10-CM | POA: Diagnosis not present

## 2020-04-12 DIAGNOSIS — Z23 Encounter for immunization: Secondary | ICD-10-CM | POA: Diagnosis not present

## 2020-04-12 DIAGNOSIS — L89312 Pressure ulcer of right buttock, stage 2: Secondary | ICD-10-CM | POA: Diagnosis not present

## 2020-04-12 DIAGNOSIS — E782 Mixed hyperlipidemia: Secondary | ICD-10-CM | POA: Diagnosis not present

## 2020-04-12 DIAGNOSIS — N186 End stage renal disease: Secondary | ICD-10-CM | POA: Diagnosis not present

## 2020-04-13 DIAGNOSIS — I120 Hypertensive chronic kidney disease with stage 5 chronic kidney disease or end stage renal disease: Secondary | ICD-10-CM | POA: Diagnosis not present

## 2020-04-13 DIAGNOSIS — L89312 Pressure ulcer of right buttock, stage 2: Secondary | ICD-10-CM | POA: Diagnosis not present

## 2020-04-13 DIAGNOSIS — N2581 Secondary hyperparathyroidism of renal origin: Secondary | ICD-10-CM | POA: Diagnosis not present

## 2020-04-13 DIAGNOSIS — N186 End stage renal disease: Secondary | ICD-10-CM | POA: Diagnosis not present

## 2020-04-13 DIAGNOSIS — E1122 Type 2 diabetes mellitus with diabetic chronic kidney disease: Secondary | ICD-10-CM | POA: Diagnosis not present

## 2020-04-13 DIAGNOSIS — D509 Iron deficiency anemia, unspecified: Secondary | ICD-10-CM | POA: Diagnosis not present

## 2020-04-13 DIAGNOSIS — E782 Mixed hyperlipidemia: Secondary | ICD-10-CM | POA: Diagnosis not present

## 2020-04-13 DIAGNOSIS — Z23 Encounter for immunization: Secondary | ICD-10-CM | POA: Diagnosis not present

## 2020-04-13 DIAGNOSIS — U071 COVID-19: Secondary | ICD-10-CM | POA: Diagnosis not present

## 2020-04-13 DIAGNOSIS — D631 Anemia in chronic kidney disease: Secondary | ICD-10-CM | POA: Diagnosis not present

## 2020-04-13 DIAGNOSIS — E559 Vitamin D deficiency, unspecified: Secondary | ICD-10-CM | POA: Diagnosis not present

## 2020-04-13 DIAGNOSIS — E1140 Type 2 diabetes mellitus with diabetic neuropathy, unspecified: Secondary | ICD-10-CM | POA: Diagnosis not present

## 2020-04-14 DIAGNOSIS — E559 Vitamin D deficiency, unspecified: Secondary | ICD-10-CM | POA: Diagnosis not present

## 2020-04-14 DIAGNOSIS — E1140 Type 2 diabetes mellitus with diabetic neuropathy, unspecified: Secondary | ICD-10-CM | POA: Diagnosis not present

## 2020-04-14 DIAGNOSIS — Z23 Encounter for immunization: Secondary | ICD-10-CM | POA: Diagnosis not present

## 2020-04-14 DIAGNOSIS — D509 Iron deficiency anemia, unspecified: Secondary | ICD-10-CM | POA: Diagnosis not present

## 2020-04-14 DIAGNOSIS — E782 Mixed hyperlipidemia: Secondary | ICD-10-CM | POA: Diagnosis not present

## 2020-04-14 DIAGNOSIS — U071 COVID-19: Secondary | ICD-10-CM | POA: Diagnosis not present

## 2020-04-14 DIAGNOSIS — N2581 Secondary hyperparathyroidism of renal origin: Secondary | ICD-10-CM | POA: Diagnosis not present

## 2020-04-14 DIAGNOSIS — N186 End stage renal disease: Secondary | ICD-10-CM | POA: Diagnosis not present

## 2020-04-14 DIAGNOSIS — I120 Hypertensive chronic kidney disease with stage 5 chronic kidney disease or end stage renal disease: Secondary | ICD-10-CM | POA: Diagnosis not present

## 2020-04-14 DIAGNOSIS — E1122 Type 2 diabetes mellitus with diabetic chronic kidney disease: Secondary | ICD-10-CM | POA: Diagnosis not present

## 2020-04-14 DIAGNOSIS — L89312 Pressure ulcer of right buttock, stage 2: Secondary | ICD-10-CM | POA: Diagnosis not present

## 2020-04-14 DIAGNOSIS — D631 Anemia in chronic kidney disease: Secondary | ICD-10-CM | POA: Diagnosis not present

## 2020-04-15 DIAGNOSIS — N2581 Secondary hyperparathyroidism of renal origin: Secondary | ICD-10-CM | POA: Diagnosis not present

## 2020-04-15 DIAGNOSIS — Z23 Encounter for immunization: Secondary | ICD-10-CM | POA: Diagnosis not present

## 2020-04-15 DIAGNOSIS — D631 Anemia in chronic kidney disease: Secondary | ICD-10-CM | POA: Diagnosis not present

## 2020-04-15 DIAGNOSIS — I771 Stricture of artery: Secondary | ICD-10-CM | POA: Diagnosis not present

## 2020-04-15 DIAGNOSIS — U071 COVID-19: Secondary | ICD-10-CM | POA: Diagnosis not present

## 2020-04-15 DIAGNOSIS — I6523 Occlusion and stenosis of bilateral carotid arteries: Secondary | ICD-10-CM | POA: Diagnosis not present

## 2020-04-15 DIAGNOSIS — D509 Iron deficiency anemia, unspecified: Secondary | ICD-10-CM | POA: Diagnosis not present

## 2020-04-15 DIAGNOSIS — E1122 Type 2 diabetes mellitus with diabetic chronic kidney disease: Secondary | ICD-10-CM | POA: Diagnosis not present

## 2020-04-15 DIAGNOSIS — L89312 Pressure ulcer of right buttock, stage 2: Secondary | ICD-10-CM | POA: Diagnosis not present

## 2020-04-15 DIAGNOSIS — E782 Mixed hyperlipidemia: Secondary | ICD-10-CM | POA: Diagnosis not present

## 2020-04-15 DIAGNOSIS — E1140 Type 2 diabetes mellitus with diabetic neuropathy, unspecified: Secondary | ICD-10-CM | POA: Diagnosis not present

## 2020-04-15 DIAGNOSIS — I120 Hypertensive chronic kidney disease with stage 5 chronic kidney disease or end stage renal disease: Secondary | ICD-10-CM | POA: Diagnosis not present

## 2020-04-15 DIAGNOSIS — N186 End stage renal disease: Secondary | ICD-10-CM | POA: Diagnosis not present

## 2020-04-15 DIAGNOSIS — I6529 Occlusion and stenosis of unspecified carotid artery: Secondary | ICD-10-CM | POA: Diagnosis not present

## 2020-04-15 DIAGNOSIS — E559 Vitamin D deficiency, unspecified: Secondary | ICD-10-CM | POA: Diagnosis not present

## 2020-04-16 DIAGNOSIS — N186 End stage renal disease: Secondary | ICD-10-CM | POA: Diagnosis not present

## 2020-04-16 DIAGNOSIS — Z23 Encounter for immunization: Secondary | ICD-10-CM | POA: Diagnosis not present

## 2020-04-16 DIAGNOSIS — Z4932 Encounter for adequacy testing for peritoneal dialysis: Secondary | ICD-10-CM | POA: Diagnosis not present

## 2020-04-16 DIAGNOSIS — N2581 Secondary hyperparathyroidism of renal origin: Secondary | ICD-10-CM | POA: Diagnosis not present

## 2020-04-16 DIAGNOSIS — D631 Anemia in chronic kidney disease: Secondary | ICD-10-CM | POA: Diagnosis not present

## 2020-04-16 DIAGNOSIS — D509 Iron deficiency anemia, unspecified: Secondary | ICD-10-CM | POA: Diagnosis not present

## 2020-04-17 DIAGNOSIS — N2581 Secondary hyperparathyroidism of renal origin: Secondary | ICD-10-CM | POA: Diagnosis not present

## 2020-04-17 DIAGNOSIS — D631 Anemia in chronic kidney disease: Secondary | ICD-10-CM | POA: Diagnosis not present

## 2020-04-17 DIAGNOSIS — D509 Iron deficiency anemia, unspecified: Secondary | ICD-10-CM | POA: Diagnosis not present

## 2020-04-17 DIAGNOSIS — Z23 Encounter for immunization: Secondary | ICD-10-CM | POA: Diagnosis not present

## 2020-04-17 DIAGNOSIS — N186 End stage renal disease: Secondary | ICD-10-CM | POA: Diagnosis not present

## 2020-04-18 DIAGNOSIS — E559 Vitamin D deficiency, unspecified: Secondary | ICD-10-CM | POA: Diagnosis not present

## 2020-04-18 DIAGNOSIS — I120 Hypertensive chronic kidney disease with stage 5 chronic kidney disease or end stage renal disease: Secondary | ICD-10-CM | POA: Diagnosis not present

## 2020-04-18 DIAGNOSIS — E1140 Type 2 diabetes mellitus with diabetic neuropathy, unspecified: Secondary | ICD-10-CM | POA: Diagnosis not present

## 2020-04-18 DIAGNOSIS — E782 Mixed hyperlipidemia: Secondary | ICD-10-CM | POA: Diagnosis not present

## 2020-04-18 DIAGNOSIS — D631 Anemia in chronic kidney disease: Secondary | ICD-10-CM | POA: Diagnosis not present

## 2020-04-18 DIAGNOSIS — L89312 Pressure ulcer of right buttock, stage 2: Secondary | ICD-10-CM | POA: Diagnosis not present

## 2020-04-18 DIAGNOSIS — Z23 Encounter for immunization: Secondary | ICD-10-CM | POA: Diagnosis not present

## 2020-04-18 DIAGNOSIS — N2581 Secondary hyperparathyroidism of renal origin: Secondary | ICD-10-CM | POA: Diagnosis not present

## 2020-04-18 DIAGNOSIS — U071 COVID-19: Secondary | ICD-10-CM | POA: Diagnosis not present

## 2020-04-18 DIAGNOSIS — N186 End stage renal disease: Secondary | ICD-10-CM | POA: Diagnosis not present

## 2020-04-18 DIAGNOSIS — E1122 Type 2 diabetes mellitus with diabetic chronic kidney disease: Secondary | ICD-10-CM | POA: Diagnosis not present

## 2020-04-18 DIAGNOSIS — D509 Iron deficiency anemia, unspecified: Secondary | ICD-10-CM | POA: Diagnosis not present

## 2020-04-19 DIAGNOSIS — L89312 Pressure ulcer of right buttock, stage 2: Secondary | ICD-10-CM | POA: Diagnosis not present

## 2020-04-19 DIAGNOSIS — E559 Vitamin D deficiency, unspecified: Secondary | ICD-10-CM | POA: Diagnosis not present

## 2020-04-19 DIAGNOSIS — N2581 Secondary hyperparathyroidism of renal origin: Secondary | ICD-10-CM | POA: Diagnosis not present

## 2020-04-19 DIAGNOSIS — E1140 Type 2 diabetes mellitus with diabetic neuropathy, unspecified: Secondary | ICD-10-CM | POA: Diagnosis not present

## 2020-04-19 DIAGNOSIS — N186 End stage renal disease: Secondary | ICD-10-CM | POA: Diagnosis not present

## 2020-04-19 DIAGNOSIS — U071 COVID-19: Secondary | ICD-10-CM | POA: Diagnosis not present

## 2020-04-19 DIAGNOSIS — E782 Mixed hyperlipidemia: Secondary | ICD-10-CM | POA: Diagnosis not present

## 2020-04-19 DIAGNOSIS — D631 Anemia in chronic kidney disease: Secondary | ICD-10-CM | POA: Diagnosis not present

## 2020-04-19 DIAGNOSIS — D509 Iron deficiency anemia, unspecified: Secondary | ICD-10-CM | POA: Diagnosis not present

## 2020-04-19 DIAGNOSIS — E1122 Type 2 diabetes mellitus with diabetic chronic kidney disease: Secondary | ICD-10-CM | POA: Diagnosis not present

## 2020-04-19 DIAGNOSIS — Z23 Encounter for immunization: Secondary | ICD-10-CM | POA: Diagnosis not present

## 2020-04-19 DIAGNOSIS — I120 Hypertensive chronic kidney disease with stage 5 chronic kidney disease or end stage renal disease: Secondary | ICD-10-CM | POA: Diagnosis not present

## 2020-04-20 DIAGNOSIS — E1140 Type 2 diabetes mellitus with diabetic neuropathy, unspecified: Secondary | ICD-10-CM | POA: Diagnosis not present

## 2020-04-20 DIAGNOSIS — E559 Vitamin D deficiency, unspecified: Secondary | ICD-10-CM | POA: Diagnosis not present

## 2020-04-20 DIAGNOSIS — Z23 Encounter for immunization: Secondary | ICD-10-CM | POA: Diagnosis not present

## 2020-04-20 DIAGNOSIS — E782 Mixed hyperlipidemia: Secondary | ICD-10-CM | POA: Diagnosis not present

## 2020-04-20 DIAGNOSIS — I120 Hypertensive chronic kidney disease with stage 5 chronic kidney disease or end stage renal disease: Secondary | ICD-10-CM | POA: Diagnosis not present

## 2020-04-20 DIAGNOSIS — N186 End stage renal disease: Secondary | ICD-10-CM | POA: Diagnosis not present

## 2020-04-20 DIAGNOSIS — L89312 Pressure ulcer of right buttock, stage 2: Secondary | ICD-10-CM | POA: Diagnosis not present

## 2020-04-20 DIAGNOSIS — E1122 Type 2 diabetes mellitus with diabetic chronic kidney disease: Secondary | ICD-10-CM | POA: Diagnosis not present

## 2020-04-20 DIAGNOSIS — U071 COVID-19: Secondary | ICD-10-CM | POA: Diagnosis not present

## 2020-04-20 DIAGNOSIS — D631 Anemia in chronic kidney disease: Secondary | ICD-10-CM | POA: Diagnosis not present

## 2020-04-20 DIAGNOSIS — N2581 Secondary hyperparathyroidism of renal origin: Secondary | ICD-10-CM | POA: Diagnosis not present

## 2020-04-20 DIAGNOSIS — D509 Iron deficiency anemia, unspecified: Secondary | ICD-10-CM | POA: Diagnosis not present

## 2020-04-21 DIAGNOSIS — E782 Mixed hyperlipidemia: Secondary | ICD-10-CM | POA: Diagnosis not present

## 2020-04-21 DIAGNOSIS — E1140 Type 2 diabetes mellitus with diabetic neuropathy, unspecified: Secondary | ICD-10-CM | POA: Diagnosis not present

## 2020-04-21 DIAGNOSIS — E559 Vitamin D deficiency, unspecified: Secondary | ICD-10-CM | POA: Diagnosis not present

## 2020-04-21 DIAGNOSIS — Z23 Encounter for immunization: Secondary | ICD-10-CM | POA: Diagnosis not present

## 2020-04-21 DIAGNOSIS — D509 Iron deficiency anemia, unspecified: Secondary | ICD-10-CM | POA: Diagnosis not present

## 2020-04-21 DIAGNOSIS — L89312 Pressure ulcer of right buttock, stage 2: Secondary | ICD-10-CM | POA: Diagnosis not present

## 2020-04-21 DIAGNOSIS — N186 End stage renal disease: Secondary | ICD-10-CM | POA: Diagnosis not present

## 2020-04-21 DIAGNOSIS — I120 Hypertensive chronic kidney disease with stage 5 chronic kidney disease or end stage renal disease: Secondary | ICD-10-CM | POA: Diagnosis not present

## 2020-04-21 DIAGNOSIS — N2581 Secondary hyperparathyroidism of renal origin: Secondary | ICD-10-CM | POA: Diagnosis not present

## 2020-04-21 DIAGNOSIS — E1122 Type 2 diabetes mellitus with diabetic chronic kidney disease: Secondary | ICD-10-CM | POA: Diagnosis not present

## 2020-04-21 DIAGNOSIS — U071 COVID-19: Secondary | ICD-10-CM | POA: Diagnosis not present

## 2020-04-21 DIAGNOSIS — D631 Anemia in chronic kidney disease: Secondary | ICD-10-CM | POA: Diagnosis not present

## 2020-04-22 ENCOUNTER — Telehealth: Payer: Self-pay | Admitting: Internal Medicine

## 2020-04-22 DIAGNOSIS — D509 Iron deficiency anemia, unspecified: Secondary | ICD-10-CM | POA: Diagnosis not present

## 2020-04-22 DIAGNOSIS — K219 Gastro-esophageal reflux disease without esophagitis: Secondary | ICD-10-CM

## 2020-04-22 DIAGNOSIS — E782 Mixed hyperlipidemia: Secondary | ICD-10-CM | POA: Diagnosis not present

## 2020-04-22 DIAGNOSIS — D631 Anemia in chronic kidney disease: Secondary | ICD-10-CM | POA: Diagnosis not present

## 2020-04-22 DIAGNOSIS — U071 COVID-19: Secondary | ICD-10-CM | POA: Diagnosis not present

## 2020-04-22 DIAGNOSIS — N2581 Secondary hyperparathyroidism of renal origin: Secondary | ICD-10-CM | POA: Diagnosis not present

## 2020-04-22 DIAGNOSIS — Z23 Encounter for immunization: Secondary | ICD-10-CM | POA: Diagnosis not present

## 2020-04-22 DIAGNOSIS — E1140 Type 2 diabetes mellitus with diabetic neuropathy, unspecified: Secondary | ICD-10-CM | POA: Diagnosis not present

## 2020-04-22 DIAGNOSIS — N186 End stage renal disease: Secondary | ICD-10-CM | POA: Diagnosis not present

## 2020-04-22 DIAGNOSIS — I120 Hypertensive chronic kidney disease with stage 5 chronic kidney disease or end stage renal disease: Secondary | ICD-10-CM | POA: Diagnosis not present

## 2020-04-22 DIAGNOSIS — E785 Hyperlipidemia, unspecified: Secondary | ICD-10-CM

## 2020-04-22 DIAGNOSIS — E559 Vitamin D deficiency, unspecified: Secondary | ICD-10-CM | POA: Diagnosis not present

## 2020-04-22 DIAGNOSIS — Z794 Long term (current) use of insulin: Secondary | ICD-10-CM

## 2020-04-22 DIAGNOSIS — Z993 Dependence on wheelchair: Secondary | ICD-10-CM

## 2020-04-22 DIAGNOSIS — M545 Low back pain, unspecified: Secondary | ICD-10-CM

## 2020-04-22 DIAGNOSIS — Z9181 History of falling: Secondary | ICD-10-CM

## 2020-04-22 DIAGNOSIS — J309 Allergic rhinitis, unspecified: Secondary | ICD-10-CM

## 2020-04-22 DIAGNOSIS — Z86010 Personal history of colonic polyps: Secondary | ICD-10-CM

## 2020-04-22 DIAGNOSIS — Z992 Dependence on renal dialysis: Secondary | ICD-10-CM

## 2020-04-22 DIAGNOSIS — M1990 Unspecified osteoarthritis, unspecified site: Secondary | ICD-10-CM

## 2020-04-22 DIAGNOSIS — E1122 Type 2 diabetes mellitus with diabetic chronic kidney disease: Secondary | ICD-10-CM | POA: Diagnosis not present

## 2020-04-22 DIAGNOSIS — Z8673 Personal history of transient ischemic attack (TIA), and cerebral infarction without residual deficits: Secondary | ICD-10-CM

## 2020-04-22 DIAGNOSIS — L89312 Pressure ulcer of right buttock, stage 2: Secondary | ICD-10-CM | POA: Diagnosis not present

## 2020-04-22 NOTE — Telephone Encounter (Signed)
Rosann Auerbach from [facility] calling for orders to extend occupational therapy for 2 times a week for 2 weeks and 1 time a week every other week for 3 weeks.

## 2020-04-23 DIAGNOSIS — Z23 Encounter for immunization: Secondary | ICD-10-CM | POA: Diagnosis not present

## 2020-04-23 DIAGNOSIS — N186 End stage renal disease: Secondary | ICD-10-CM | POA: Diagnosis not present

## 2020-04-23 DIAGNOSIS — D631 Anemia in chronic kidney disease: Secondary | ICD-10-CM | POA: Diagnosis not present

## 2020-04-23 DIAGNOSIS — D509 Iron deficiency anemia, unspecified: Secondary | ICD-10-CM | POA: Diagnosis not present

## 2020-04-23 DIAGNOSIS — N2581 Secondary hyperparathyroidism of renal origin: Secondary | ICD-10-CM | POA: Diagnosis not present

## 2020-04-23 NOTE — Telephone Encounter (Signed)
Okay.  Thanks.

## 2020-04-23 NOTE — Telephone Encounter (Signed)
Notified Marsha w/MD response.Marland KitchenLind Guest

## 2020-04-24 DIAGNOSIS — N186 End stage renal disease: Secondary | ICD-10-CM | POA: Diagnosis not present

## 2020-04-24 DIAGNOSIS — D509 Iron deficiency anemia, unspecified: Secondary | ICD-10-CM | POA: Diagnosis not present

## 2020-04-24 DIAGNOSIS — N2581 Secondary hyperparathyroidism of renal origin: Secondary | ICD-10-CM | POA: Diagnosis not present

## 2020-04-24 DIAGNOSIS — D631 Anemia in chronic kidney disease: Secondary | ICD-10-CM | POA: Diagnosis not present

## 2020-04-24 DIAGNOSIS — Z23 Encounter for immunization: Secondary | ICD-10-CM | POA: Diagnosis not present

## 2020-04-25 DIAGNOSIS — N2581 Secondary hyperparathyroidism of renal origin: Secondary | ICD-10-CM | POA: Diagnosis not present

## 2020-04-25 DIAGNOSIS — D509 Iron deficiency anemia, unspecified: Secondary | ICD-10-CM | POA: Diagnosis not present

## 2020-04-25 DIAGNOSIS — Z23 Encounter for immunization: Secondary | ICD-10-CM | POA: Diagnosis not present

## 2020-04-25 DIAGNOSIS — Z992 Dependence on renal dialysis: Secondary | ICD-10-CM | POA: Diagnosis not present

## 2020-04-25 DIAGNOSIS — D631 Anemia in chronic kidney disease: Secondary | ICD-10-CM | POA: Diagnosis not present

## 2020-04-25 DIAGNOSIS — N186 End stage renal disease: Secondary | ICD-10-CM | POA: Diagnosis not present

## 2020-04-26 DIAGNOSIS — D631 Anemia in chronic kidney disease: Secondary | ICD-10-CM | POA: Diagnosis not present

## 2020-04-26 DIAGNOSIS — D509 Iron deficiency anemia, unspecified: Secondary | ICD-10-CM | POA: Diagnosis not present

## 2020-04-26 DIAGNOSIS — E1122 Type 2 diabetes mellitus with diabetic chronic kidney disease: Secondary | ICD-10-CM | POA: Diagnosis not present

## 2020-04-26 DIAGNOSIS — N2581 Secondary hyperparathyroidism of renal origin: Secondary | ICD-10-CM | POA: Diagnosis not present

## 2020-04-26 DIAGNOSIS — E559 Vitamin D deficiency, unspecified: Secondary | ICD-10-CM | POA: Diagnosis not present

## 2020-04-26 DIAGNOSIS — U071 COVID-19: Secondary | ICD-10-CM | POA: Diagnosis not present

## 2020-04-26 DIAGNOSIS — I120 Hypertensive chronic kidney disease with stage 5 chronic kidney disease or end stage renal disease: Secondary | ICD-10-CM | POA: Diagnosis not present

## 2020-04-26 DIAGNOSIS — E1140 Type 2 diabetes mellitus with diabetic neuropathy, unspecified: Secondary | ICD-10-CM | POA: Diagnosis not present

## 2020-04-26 DIAGNOSIS — N186 End stage renal disease: Secondary | ICD-10-CM | POA: Diagnosis not present

## 2020-04-26 DIAGNOSIS — E782 Mixed hyperlipidemia: Secondary | ICD-10-CM | POA: Diagnosis not present

## 2020-04-26 DIAGNOSIS — L89312 Pressure ulcer of right buttock, stage 2: Secondary | ICD-10-CM | POA: Diagnosis not present

## 2020-04-27 DIAGNOSIS — E1122 Type 2 diabetes mellitus with diabetic chronic kidney disease: Secondary | ICD-10-CM | POA: Diagnosis not present

## 2020-04-27 DIAGNOSIS — L89312 Pressure ulcer of right buttock, stage 2: Secondary | ICD-10-CM | POA: Diagnosis not present

## 2020-04-27 DIAGNOSIS — E782 Mixed hyperlipidemia: Secondary | ICD-10-CM | POA: Diagnosis not present

## 2020-04-27 DIAGNOSIS — E559 Vitamin D deficiency, unspecified: Secondary | ICD-10-CM | POA: Diagnosis not present

## 2020-04-27 DIAGNOSIS — U071 COVID-19: Secondary | ICD-10-CM | POA: Diagnosis not present

## 2020-04-27 DIAGNOSIS — I120 Hypertensive chronic kidney disease with stage 5 chronic kidney disease or end stage renal disease: Secondary | ICD-10-CM | POA: Diagnosis not present

## 2020-04-27 DIAGNOSIS — E1140 Type 2 diabetes mellitus with diabetic neuropathy, unspecified: Secondary | ICD-10-CM | POA: Diagnosis not present

## 2020-04-27 DIAGNOSIS — D631 Anemia in chronic kidney disease: Secondary | ICD-10-CM | POA: Diagnosis not present

## 2020-04-27 DIAGNOSIS — D509 Iron deficiency anemia, unspecified: Secondary | ICD-10-CM | POA: Diagnosis not present

## 2020-04-27 DIAGNOSIS — N2581 Secondary hyperparathyroidism of renal origin: Secondary | ICD-10-CM | POA: Diagnosis not present

## 2020-04-27 DIAGNOSIS — N186 End stage renal disease: Secondary | ICD-10-CM | POA: Diagnosis not present

## 2020-04-28 ENCOUNTER — Telehealth: Payer: Self-pay | Admitting: Internal Medicine

## 2020-04-28 DIAGNOSIS — D631 Anemia in chronic kidney disease: Secondary | ICD-10-CM | POA: Diagnosis not present

## 2020-04-28 DIAGNOSIS — E1140 Type 2 diabetes mellitus with diabetic neuropathy, unspecified: Secondary | ICD-10-CM | POA: Diagnosis not present

## 2020-04-28 DIAGNOSIS — N2581 Secondary hyperparathyroidism of renal origin: Secondary | ICD-10-CM | POA: Diagnosis not present

## 2020-04-28 DIAGNOSIS — E1122 Type 2 diabetes mellitus with diabetic chronic kidney disease: Secondary | ICD-10-CM | POA: Diagnosis not present

## 2020-04-28 DIAGNOSIS — I120 Hypertensive chronic kidney disease with stage 5 chronic kidney disease or end stage renal disease: Secondary | ICD-10-CM | POA: Diagnosis not present

## 2020-04-28 DIAGNOSIS — N186 End stage renal disease: Secondary | ICD-10-CM | POA: Diagnosis not present

## 2020-04-28 DIAGNOSIS — E782 Mixed hyperlipidemia: Secondary | ICD-10-CM | POA: Diagnosis not present

## 2020-04-28 DIAGNOSIS — U071 COVID-19: Secondary | ICD-10-CM | POA: Diagnosis not present

## 2020-04-28 DIAGNOSIS — L89312 Pressure ulcer of right buttock, stage 2: Secondary | ICD-10-CM | POA: Diagnosis not present

## 2020-04-28 DIAGNOSIS — E559 Vitamin D deficiency, unspecified: Secondary | ICD-10-CM | POA: Diagnosis not present

## 2020-04-28 DIAGNOSIS — D509 Iron deficiency anemia, unspecified: Secondary | ICD-10-CM | POA: Diagnosis not present

## 2020-04-28 NOTE — Telephone Encounter (Signed)
Mary Valencia from [facility] calling to request to decrease her frequency to weekly instead of twice a week. [phone]

## 2020-04-29 ENCOUNTER — Telehealth: Payer: Self-pay | Admitting: Internal Medicine

## 2020-04-29 DIAGNOSIS — I120 Hypertensive chronic kidney disease with stage 5 chronic kidney disease or end stage renal disease: Secondary | ICD-10-CM | POA: Diagnosis not present

## 2020-04-29 DIAGNOSIS — E782 Mixed hyperlipidemia: Secondary | ICD-10-CM | POA: Diagnosis not present

## 2020-04-29 DIAGNOSIS — N186 End stage renal disease: Secondary | ICD-10-CM | POA: Diagnosis not present

## 2020-04-29 DIAGNOSIS — U071 COVID-19: Secondary | ICD-10-CM | POA: Diagnosis not present

## 2020-04-29 DIAGNOSIS — L89312 Pressure ulcer of right buttock, stage 2: Secondary | ICD-10-CM | POA: Diagnosis not present

## 2020-04-29 DIAGNOSIS — E559 Vitamin D deficiency, unspecified: Secondary | ICD-10-CM | POA: Diagnosis not present

## 2020-04-29 DIAGNOSIS — E1140 Type 2 diabetes mellitus with diabetic neuropathy, unspecified: Secondary | ICD-10-CM | POA: Diagnosis not present

## 2020-04-29 DIAGNOSIS — I639 Cerebral infarction, unspecified: Secondary | ICD-10-CM

## 2020-04-29 DIAGNOSIS — N2581 Secondary hyperparathyroidism of renal origin: Secondary | ICD-10-CM | POA: Diagnosis not present

## 2020-04-29 DIAGNOSIS — L89159 Pressure ulcer of sacral region, unspecified stage: Secondary | ICD-10-CM

## 2020-04-29 DIAGNOSIS — D509 Iron deficiency anemia, unspecified: Secondary | ICD-10-CM | POA: Diagnosis not present

## 2020-04-29 DIAGNOSIS — D631 Anemia in chronic kidney disease: Secondary | ICD-10-CM | POA: Diagnosis not present

## 2020-04-29 DIAGNOSIS — E1122 Type 2 diabetes mellitus with diabetic chronic kidney disease: Secondary | ICD-10-CM | POA: Diagnosis not present

## 2020-04-29 DIAGNOSIS — S81811D Laceration without foreign body, right lower leg, subsequent encounter: Secondary | ICD-10-CM

## 2020-04-29 NOTE — Telephone Encounter (Signed)
Ok Thx 

## 2020-04-29 NOTE — Telephone Encounter (Signed)
OK. Thx

## 2020-04-29 NOTE — Telephone Encounter (Signed)
   Corbin Ade from [facility] calling to request order for hospital bed with half rails and scale be ordered and sent to [place]  Phone [phone]

## 2020-04-30 DIAGNOSIS — D509 Iron deficiency anemia, unspecified: Secondary | ICD-10-CM | POA: Diagnosis not present

## 2020-04-30 DIAGNOSIS — D631 Anemia in chronic kidney disease: Secondary | ICD-10-CM | POA: Diagnosis not present

## 2020-04-30 DIAGNOSIS — N2581 Secondary hyperparathyroidism of renal origin: Secondary | ICD-10-CM | POA: Diagnosis not present

## 2020-04-30 DIAGNOSIS — N186 End stage renal disease: Secondary | ICD-10-CM | POA: Diagnosis not present

## 2020-04-30 NOTE — Telephone Encounter (Signed)
Verbal order per Dr Alain Marion to decrease frequency to weekly instead of twice a week.  Shanna verb understanding.

## 2020-05-01 DIAGNOSIS — D631 Anemia in chronic kidney disease: Secondary | ICD-10-CM | POA: Diagnosis not present

## 2020-05-01 DIAGNOSIS — N186 End stage renal disease: Secondary | ICD-10-CM | POA: Diagnosis not present

## 2020-05-01 DIAGNOSIS — N2581 Secondary hyperparathyroidism of renal origin: Secondary | ICD-10-CM | POA: Diagnosis not present

## 2020-05-01 DIAGNOSIS — D509 Iron deficiency anemia, unspecified: Secondary | ICD-10-CM | POA: Diagnosis not present

## 2020-05-02 DIAGNOSIS — N186 End stage renal disease: Secondary | ICD-10-CM | POA: Diagnosis not present

## 2020-05-02 DIAGNOSIS — D509 Iron deficiency anemia, unspecified: Secondary | ICD-10-CM | POA: Diagnosis not present

## 2020-05-02 DIAGNOSIS — D631 Anemia in chronic kidney disease: Secondary | ICD-10-CM | POA: Diagnosis not present

## 2020-05-02 DIAGNOSIS — N2581 Secondary hyperparathyroidism of renal origin: Secondary | ICD-10-CM | POA: Diagnosis not present

## 2020-05-03 DIAGNOSIS — N2581 Secondary hyperparathyroidism of renal origin: Secondary | ICD-10-CM | POA: Diagnosis not present

## 2020-05-03 DIAGNOSIS — D509 Iron deficiency anemia, unspecified: Secondary | ICD-10-CM | POA: Diagnosis not present

## 2020-05-03 DIAGNOSIS — D631 Anemia in chronic kidney disease: Secondary | ICD-10-CM | POA: Diagnosis not present

## 2020-05-03 DIAGNOSIS — L89312 Pressure ulcer of right buttock, stage 2: Secondary | ICD-10-CM | POA: Diagnosis not present

## 2020-05-03 DIAGNOSIS — E1122 Type 2 diabetes mellitus with diabetic chronic kidney disease: Secondary | ICD-10-CM | POA: Diagnosis not present

## 2020-05-03 DIAGNOSIS — I120 Hypertensive chronic kidney disease with stage 5 chronic kidney disease or end stage renal disease: Secondary | ICD-10-CM | POA: Diagnosis not present

## 2020-05-03 DIAGNOSIS — E782 Mixed hyperlipidemia: Secondary | ICD-10-CM | POA: Diagnosis not present

## 2020-05-03 DIAGNOSIS — U071 COVID-19: Secondary | ICD-10-CM | POA: Diagnosis not present

## 2020-05-03 DIAGNOSIS — N186 End stage renal disease: Secondary | ICD-10-CM | POA: Diagnosis not present

## 2020-05-03 DIAGNOSIS — E559 Vitamin D deficiency, unspecified: Secondary | ICD-10-CM | POA: Diagnosis not present

## 2020-05-03 DIAGNOSIS — E1140 Type 2 diabetes mellitus with diabetic neuropathy, unspecified: Secondary | ICD-10-CM | POA: Diagnosis not present

## 2020-05-03 NOTE — Telephone Encounter (Signed)
Printed DME for hosp bed w/rails & scale. Faxed  to [facility]

## 2020-05-03 NOTE — Addendum Note (Signed)
Addended by: Earnstine Regal on: 05/03/2020 12:18 PM   Modules accepted: Orders

## 2020-05-04 DIAGNOSIS — E782 Mixed hyperlipidemia: Secondary | ICD-10-CM | POA: Diagnosis not present

## 2020-05-04 DIAGNOSIS — L89312 Pressure ulcer of right buttock, stage 2: Secondary | ICD-10-CM | POA: Diagnosis not present

## 2020-05-04 DIAGNOSIS — E1140 Type 2 diabetes mellitus with diabetic neuropathy, unspecified: Secondary | ICD-10-CM | POA: Diagnosis not present

## 2020-05-04 DIAGNOSIS — E119 Type 2 diabetes mellitus without complications: Secondary | ICD-10-CM | POA: Diagnosis not present

## 2020-05-04 DIAGNOSIS — I120 Hypertensive chronic kidney disease with stage 5 chronic kidney disease or end stage renal disease: Secondary | ICD-10-CM | POA: Diagnosis not present

## 2020-05-04 DIAGNOSIS — N186 End stage renal disease: Secondary | ICD-10-CM | POA: Diagnosis not present

## 2020-05-04 DIAGNOSIS — D509 Iron deficiency anemia, unspecified: Secondary | ICD-10-CM | POA: Diagnosis not present

## 2020-05-04 DIAGNOSIS — D631 Anemia in chronic kidney disease: Secondary | ICD-10-CM | POA: Diagnosis not present

## 2020-05-04 DIAGNOSIS — E1122 Type 2 diabetes mellitus with diabetic chronic kidney disease: Secondary | ICD-10-CM | POA: Diagnosis not present

## 2020-05-04 DIAGNOSIS — N2581 Secondary hyperparathyroidism of renal origin: Secondary | ICD-10-CM | POA: Diagnosis not present

## 2020-05-04 DIAGNOSIS — E559 Vitamin D deficiency, unspecified: Secondary | ICD-10-CM | POA: Diagnosis not present

## 2020-05-04 DIAGNOSIS — U071 COVID-19: Secondary | ICD-10-CM | POA: Diagnosis not present

## 2020-05-05 DIAGNOSIS — N186 End stage renal disease: Secondary | ICD-10-CM | POA: Diagnosis not present

## 2020-05-05 DIAGNOSIS — E1122 Type 2 diabetes mellitus with diabetic chronic kidney disease: Secondary | ICD-10-CM | POA: Diagnosis not present

## 2020-05-05 DIAGNOSIS — L89312 Pressure ulcer of right buttock, stage 2: Secondary | ICD-10-CM | POA: Diagnosis not present

## 2020-05-05 DIAGNOSIS — D631 Anemia in chronic kidney disease: Secondary | ICD-10-CM | POA: Diagnosis not present

## 2020-05-05 DIAGNOSIS — D509 Iron deficiency anemia, unspecified: Secondary | ICD-10-CM | POA: Diagnosis not present

## 2020-05-05 DIAGNOSIS — E559 Vitamin D deficiency, unspecified: Secondary | ICD-10-CM | POA: Diagnosis not present

## 2020-05-05 DIAGNOSIS — N2581 Secondary hyperparathyroidism of renal origin: Secondary | ICD-10-CM | POA: Diagnosis not present

## 2020-05-05 DIAGNOSIS — I120 Hypertensive chronic kidney disease with stage 5 chronic kidney disease or end stage renal disease: Secondary | ICD-10-CM | POA: Diagnosis not present

## 2020-05-05 DIAGNOSIS — E1140 Type 2 diabetes mellitus with diabetic neuropathy, unspecified: Secondary | ICD-10-CM | POA: Diagnosis not present

## 2020-05-05 DIAGNOSIS — E782 Mixed hyperlipidemia: Secondary | ICD-10-CM | POA: Diagnosis not present

## 2020-05-05 DIAGNOSIS — U071 COVID-19: Secondary | ICD-10-CM | POA: Diagnosis not present

## 2020-05-06 ENCOUNTER — Telehealth: Payer: Self-pay | Admitting: Internal Medicine

## 2020-05-06 DIAGNOSIS — E559 Vitamin D deficiency, unspecified: Secondary | ICD-10-CM | POA: Diagnosis not present

## 2020-05-06 DIAGNOSIS — U071 COVID-19: Secondary | ICD-10-CM | POA: Diagnosis not present

## 2020-05-06 DIAGNOSIS — L89312 Pressure ulcer of right buttock, stage 2: Secondary | ICD-10-CM | POA: Diagnosis not present

## 2020-05-06 DIAGNOSIS — N186 End stage renal disease: Secondary | ICD-10-CM | POA: Diagnosis not present

## 2020-05-06 DIAGNOSIS — N2581 Secondary hyperparathyroidism of renal origin: Secondary | ICD-10-CM | POA: Diagnosis not present

## 2020-05-06 DIAGNOSIS — E1122 Type 2 diabetes mellitus with diabetic chronic kidney disease: Secondary | ICD-10-CM | POA: Diagnosis not present

## 2020-05-06 DIAGNOSIS — E1140 Type 2 diabetes mellitus with diabetic neuropathy, unspecified: Secondary | ICD-10-CM | POA: Diagnosis not present

## 2020-05-06 DIAGNOSIS — D631 Anemia in chronic kidney disease: Secondary | ICD-10-CM | POA: Diagnosis not present

## 2020-05-06 DIAGNOSIS — D509 Iron deficiency anemia, unspecified: Secondary | ICD-10-CM | POA: Diagnosis not present

## 2020-05-06 DIAGNOSIS — I120 Hypertensive chronic kidney disease with stage 5 chronic kidney disease or end stage renal disease: Secondary | ICD-10-CM | POA: Diagnosis not present

## 2020-05-06 DIAGNOSIS — E782 Mixed hyperlipidemia: Secondary | ICD-10-CM | POA: Diagnosis not present

## 2020-05-06 NOTE — Telephone Encounter (Signed)
Patient was in the hospital in September for covid and they took her off of baby asprin and she was wondering if she needs to be back on it. [phone]

## 2020-05-07 DIAGNOSIS — D509 Iron deficiency anemia, unspecified: Secondary | ICD-10-CM | POA: Diagnosis not present

## 2020-05-07 DIAGNOSIS — N2581 Secondary hyperparathyroidism of renal origin: Secondary | ICD-10-CM | POA: Diagnosis not present

## 2020-05-07 DIAGNOSIS — D631 Anemia in chronic kidney disease: Secondary | ICD-10-CM | POA: Diagnosis not present

## 2020-05-07 DIAGNOSIS — N186 End stage renal disease: Secondary | ICD-10-CM | POA: Diagnosis not present

## 2020-05-07 NOTE — Telephone Encounter (Signed)
Trid calling pt no answer can't leave msg due to vm being full../lm,b

## 2020-05-07 NOTE — Telephone Encounter (Signed)
Yes, restart baby aspirin unless she was put on a another blood thinner that was not reflected in our record.  Thanks

## 2020-05-08 DIAGNOSIS — N186 End stage renal disease: Secondary | ICD-10-CM | POA: Diagnosis not present

## 2020-05-08 DIAGNOSIS — D509 Iron deficiency anemia, unspecified: Secondary | ICD-10-CM | POA: Diagnosis not present

## 2020-05-08 DIAGNOSIS — D631 Anemia in chronic kidney disease: Secondary | ICD-10-CM | POA: Diagnosis not present

## 2020-05-08 DIAGNOSIS — N2581 Secondary hyperparathyroidism of renal origin: Secondary | ICD-10-CM | POA: Diagnosis not present

## 2020-05-09 DIAGNOSIS — D509 Iron deficiency anemia, unspecified: Secondary | ICD-10-CM | POA: Diagnosis not present

## 2020-05-09 DIAGNOSIS — N2581 Secondary hyperparathyroidism of renal origin: Secondary | ICD-10-CM | POA: Diagnosis not present

## 2020-05-09 DIAGNOSIS — D631 Anemia in chronic kidney disease: Secondary | ICD-10-CM | POA: Diagnosis not present

## 2020-05-09 DIAGNOSIS — N186 End stage renal disease: Secondary | ICD-10-CM | POA: Diagnosis not present

## 2020-05-10 DIAGNOSIS — D509 Iron deficiency anemia, unspecified: Secondary | ICD-10-CM | POA: Diagnosis not present

## 2020-05-10 DIAGNOSIS — D631 Anemia in chronic kidney disease: Secondary | ICD-10-CM | POA: Diagnosis not present

## 2020-05-10 DIAGNOSIS — N186 End stage renal disease: Secondary | ICD-10-CM | POA: Diagnosis not present

## 2020-05-10 DIAGNOSIS — N2581 Secondary hyperparathyroidism of renal origin: Secondary | ICD-10-CM | POA: Diagnosis not present

## 2020-05-10 NOTE — Telephone Encounter (Signed)
Tried calling pt again still no answer and can't leave msg due to vm being full.Marland KitchenJohny Valencia

## 2020-05-11 DIAGNOSIS — N186 End stage renal disease: Secondary | ICD-10-CM | POA: Diagnosis not present

## 2020-05-11 DIAGNOSIS — E782 Mixed hyperlipidemia: Secondary | ICD-10-CM | POA: Diagnosis not present

## 2020-05-11 DIAGNOSIS — E1140 Type 2 diabetes mellitus with diabetic neuropathy, unspecified: Secondary | ICD-10-CM | POA: Diagnosis not present

## 2020-05-11 DIAGNOSIS — I120 Hypertensive chronic kidney disease with stage 5 chronic kidney disease or end stage renal disease: Secondary | ICD-10-CM | POA: Diagnosis not present

## 2020-05-11 DIAGNOSIS — E1122 Type 2 diabetes mellitus with diabetic chronic kidney disease: Secondary | ICD-10-CM | POA: Diagnosis not present

## 2020-05-11 DIAGNOSIS — E559 Vitamin D deficiency, unspecified: Secondary | ICD-10-CM | POA: Diagnosis not present

## 2020-05-11 DIAGNOSIS — N2581 Secondary hyperparathyroidism of renal origin: Secondary | ICD-10-CM | POA: Diagnosis not present

## 2020-05-11 DIAGNOSIS — D631 Anemia in chronic kidney disease: Secondary | ICD-10-CM | POA: Diagnosis not present

## 2020-05-11 DIAGNOSIS — U071 COVID-19: Secondary | ICD-10-CM | POA: Diagnosis not present

## 2020-05-11 DIAGNOSIS — D509 Iron deficiency anemia, unspecified: Secondary | ICD-10-CM | POA: Diagnosis not present

## 2020-05-11 DIAGNOSIS — L89312 Pressure ulcer of right buttock, stage 2: Secondary | ICD-10-CM | POA: Diagnosis not present

## 2020-05-11 NOTE — Telephone Encounter (Signed)
Tried calling pt again still no answer and vm is still full. Closing encounter.Marland KitchenJohny Chess

## 2020-05-12 DIAGNOSIS — E559 Vitamin D deficiency, unspecified: Secondary | ICD-10-CM | POA: Diagnosis not present

## 2020-05-12 DIAGNOSIS — E782 Mixed hyperlipidemia: Secondary | ICD-10-CM | POA: Diagnosis not present

## 2020-05-12 DIAGNOSIS — N2581 Secondary hyperparathyroidism of renal origin: Secondary | ICD-10-CM | POA: Diagnosis not present

## 2020-05-12 DIAGNOSIS — I120 Hypertensive chronic kidney disease with stage 5 chronic kidney disease or end stage renal disease: Secondary | ICD-10-CM | POA: Diagnosis not present

## 2020-05-12 DIAGNOSIS — E1140 Type 2 diabetes mellitus with diabetic neuropathy, unspecified: Secondary | ICD-10-CM | POA: Diagnosis not present

## 2020-05-12 DIAGNOSIS — D509 Iron deficiency anemia, unspecified: Secondary | ICD-10-CM | POA: Diagnosis not present

## 2020-05-12 DIAGNOSIS — L89312 Pressure ulcer of right buttock, stage 2: Secondary | ICD-10-CM | POA: Diagnosis not present

## 2020-05-12 DIAGNOSIS — E1122 Type 2 diabetes mellitus with diabetic chronic kidney disease: Secondary | ICD-10-CM | POA: Diagnosis not present

## 2020-05-12 DIAGNOSIS — D631 Anemia in chronic kidney disease: Secondary | ICD-10-CM | POA: Diagnosis not present

## 2020-05-12 DIAGNOSIS — U071 COVID-19: Secondary | ICD-10-CM | POA: Diagnosis not present

## 2020-05-12 DIAGNOSIS — N186 End stage renal disease: Secondary | ICD-10-CM | POA: Diagnosis not present

## 2020-05-13 DIAGNOSIS — E1122 Type 2 diabetes mellitus with diabetic chronic kidney disease: Secondary | ICD-10-CM | POA: Diagnosis not present

## 2020-05-13 DIAGNOSIS — U071 COVID-19: Secondary | ICD-10-CM | POA: Diagnosis not present

## 2020-05-13 DIAGNOSIS — L89312 Pressure ulcer of right buttock, stage 2: Secondary | ICD-10-CM | POA: Diagnosis not present

## 2020-05-13 DIAGNOSIS — N2581 Secondary hyperparathyroidism of renal origin: Secondary | ICD-10-CM | POA: Diagnosis not present

## 2020-05-13 DIAGNOSIS — D509 Iron deficiency anemia, unspecified: Secondary | ICD-10-CM | POA: Diagnosis not present

## 2020-05-13 DIAGNOSIS — E782 Mixed hyperlipidemia: Secondary | ICD-10-CM | POA: Diagnosis not present

## 2020-05-13 DIAGNOSIS — E559 Vitamin D deficiency, unspecified: Secondary | ICD-10-CM | POA: Diagnosis not present

## 2020-05-13 DIAGNOSIS — D631 Anemia in chronic kidney disease: Secondary | ICD-10-CM | POA: Diagnosis not present

## 2020-05-13 DIAGNOSIS — E1140 Type 2 diabetes mellitus with diabetic neuropathy, unspecified: Secondary | ICD-10-CM | POA: Diagnosis not present

## 2020-05-13 DIAGNOSIS — I120 Hypertensive chronic kidney disease with stage 5 chronic kidney disease or end stage renal disease: Secondary | ICD-10-CM | POA: Diagnosis not present

## 2020-05-13 DIAGNOSIS — N186 End stage renal disease: Secondary | ICD-10-CM | POA: Diagnosis not present

## 2020-05-14 DIAGNOSIS — D509 Iron deficiency anemia, unspecified: Secondary | ICD-10-CM | POA: Diagnosis not present

## 2020-05-14 DIAGNOSIS — N186 End stage renal disease: Secondary | ICD-10-CM | POA: Diagnosis not present

## 2020-05-14 DIAGNOSIS — D631 Anemia in chronic kidney disease: Secondary | ICD-10-CM | POA: Diagnosis not present

## 2020-05-14 DIAGNOSIS — N2581 Secondary hyperparathyroidism of renal origin: Secondary | ICD-10-CM | POA: Diagnosis not present

## 2020-05-15 DIAGNOSIS — D509 Iron deficiency anemia, unspecified: Secondary | ICD-10-CM | POA: Diagnosis not present

## 2020-05-15 DIAGNOSIS — N2581 Secondary hyperparathyroidism of renal origin: Secondary | ICD-10-CM | POA: Diagnosis not present

## 2020-05-15 DIAGNOSIS — N186 End stage renal disease: Secondary | ICD-10-CM | POA: Diagnosis not present

## 2020-05-15 DIAGNOSIS — D631 Anemia in chronic kidney disease: Secondary | ICD-10-CM | POA: Diagnosis not present

## 2020-05-16 DIAGNOSIS — D631 Anemia in chronic kidney disease: Secondary | ICD-10-CM | POA: Diagnosis not present

## 2020-05-16 DIAGNOSIS — N2581 Secondary hyperparathyroidism of renal origin: Secondary | ICD-10-CM | POA: Diagnosis not present

## 2020-05-16 DIAGNOSIS — N186 End stage renal disease: Secondary | ICD-10-CM | POA: Diagnosis not present

## 2020-05-16 DIAGNOSIS — D509 Iron deficiency anemia, unspecified: Secondary | ICD-10-CM | POA: Diagnosis not present

## 2020-05-17 DIAGNOSIS — D631 Anemia in chronic kidney disease: Secondary | ICD-10-CM | POA: Diagnosis not present

## 2020-05-17 DIAGNOSIS — E782 Mixed hyperlipidemia: Secondary | ICD-10-CM | POA: Diagnosis not present

## 2020-05-17 DIAGNOSIS — E1140 Type 2 diabetes mellitus with diabetic neuropathy, unspecified: Secondary | ICD-10-CM | POA: Diagnosis not present

## 2020-05-17 DIAGNOSIS — L89312 Pressure ulcer of right buttock, stage 2: Secondary | ICD-10-CM | POA: Diagnosis not present

## 2020-05-17 DIAGNOSIS — N2581 Secondary hyperparathyroidism of renal origin: Secondary | ICD-10-CM | POA: Diagnosis not present

## 2020-05-17 DIAGNOSIS — U071 COVID-19: Secondary | ICD-10-CM | POA: Diagnosis not present

## 2020-05-17 DIAGNOSIS — E559 Vitamin D deficiency, unspecified: Secondary | ICD-10-CM | POA: Diagnosis not present

## 2020-05-17 DIAGNOSIS — I120 Hypertensive chronic kidney disease with stage 5 chronic kidney disease or end stage renal disease: Secondary | ICD-10-CM | POA: Diagnosis not present

## 2020-05-17 DIAGNOSIS — N186 End stage renal disease: Secondary | ICD-10-CM | POA: Diagnosis not present

## 2020-05-17 DIAGNOSIS — D509 Iron deficiency anemia, unspecified: Secondary | ICD-10-CM | POA: Diagnosis not present

## 2020-05-17 DIAGNOSIS — E1122 Type 2 diabetes mellitus with diabetic chronic kidney disease: Secondary | ICD-10-CM | POA: Diagnosis not present

## 2020-05-18 DIAGNOSIS — D509 Iron deficiency anemia, unspecified: Secondary | ICD-10-CM | POA: Diagnosis not present

## 2020-05-18 DIAGNOSIS — E1122 Type 2 diabetes mellitus with diabetic chronic kidney disease: Secondary | ICD-10-CM | POA: Diagnosis not present

## 2020-05-18 DIAGNOSIS — N2581 Secondary hyperparathyroidism of renal origin: Secondary | ICD-10-CM | POA: Diagnosis not present

## 2020-05-18 DIAGNOSIS — I10 Essential (primary) hypertension: Secondary | ICD-10-CM | POA: Diagnosis not present

## 2020-05-18 DIAGNOSIS — E782 Mixed hyperlipidemia: Secondary | ICD-10-CM | POA: Diagnosis not present

## 2020-05-18 DIAGNOSIS — E559 Vitamin D deficiency, unspecified: Secondary | ICD-10-CM | POA: Diagnosis not present

## 2020-05-18 DIAGNOSIS — U071 COVID-19: Secondary | ICD-10-CM | POA: Diagnosis not present

## 2020-05-18 DIAGNOSIS — E1140 Type 2 diabetes mellitus with diabetic neuropathy, unspecified: Secondary | ICD-10-CM | POA: Diagnosis not present

## 2020-05-18 DIAGNOSIS — N186 End stage renal disease: Secondary | ICD-10-CM | POA: Diagnosis not present

## 2020-05-18 DIAGNOSIS — L89312 Pressure ulcer of right buttock, stage 2: Secondary | ICD-10-CM | POA: Diagnosis not present

## 2020-05-18 DIAGNOSIS — D631 Anemia in chronic kidney disease: Secondary | ICD-10-CM | POA: Diagnosis not present

## 2020-05-18 DIAGNOSIS — I120 Hypertensive chronic kidney disease with stage 5 chronic kidney disease or end stage renal disease: Secondary | ICD-10-CM | POA: Diagnosis not present

## 2020-05-19 DIAGNOSIS — I120 Hypertensive chronic kidney disease with stage 5 chronic kidney disease or end stage renal disease: Secondary | ICD-10-CM | POA: Diagnosis not present

## 2020-05-19 DIAGNOSIS — D631 Anemia in chronic kidney disease: Secondary | ICD-10-CM | POA: Diagnosis not present

## 2020-05-19 DIAGNOSIS — N186 End stage renal disease: Secondary | ICD-10-CM | POA: Diagnosis not present

## 2020-05-19 DIAGNOSIS — E1140 Type 2 diabetes mellitus with diabetic neuropathy, unspecified: Secondary | ICD-10-CM | POA: Diagnosis not present

## 2020-05-19 DIAGNOSIS — N2581 Secondary hyperparathyroidism of renal origin: Secondary | ICD-10-CM | POA: Diagnosis not present

## 2020-05-19 DIAGNOSIS — U071 COVID-19: Secondary | ICD-10-CM | POA: Diagnosis not present

## 2020-05-19 DIAGNOSIS — D509 Iron deficiency anemia, unspecified: Secondary | ICD-10-CM | POA: Diagnosis not present

## 2020-05-19 DIAGNOSIS — E782 Mixed hyperlipidemia: Secondary | ICD-10-CM | POA: Diagnosis not present

## 2020-05-19 DIAGNOSIS — E1122 Type 2 diabetes mellitus with diabetic chronic kidney disease: Secondary | ICD-10-CM | POA: Diagnosis not present

## 2020-05-19 DIAGNOSIS — E559 Vitamin D deficiency, unspecified: Secondary | ICD-10-CM | POA: Diagnosis not present

## 2020-05-19 DIAGNOSIS — L89312 Pressure ulcer of right buttock, stage 2: Secondary | ICD-10-CM | POA: Diagnosis not present

## 2020-05-20 DIAGNOSIS — D509 Iron deficiency anemia, unspecified: Secondary | ICD-10-CM | POA: Diagnosis not present

## 2020-05-20 DIAGNOSIS — D631 Anemia in chronic kidney disease: Secondary | ICD-10-CM | POA: Diagnosis not present

## 2020-05-20 DIAGNOSIS — N186 End stage renal disease: Secondary | ICD-10-CM | POA: Diagnosis not present

## 2020-05-20 DIAGNOSIS — N2581 Secondary hyperparathyroidism of renal origin: Secondary | ICD-10-CM | POA: Diagnosis not present

## 2020-05-21 DIAGNOSIS — N2581 Secondary hyperparathyroidism of renal origin: Secondary | ICD-10-CM | POA: Diagnosis not present

## 2020-05-21 DIAGNOSIS — D631 Anemia in chronic kidney disease: Secondary | ICD-10-CM | POA: Diagnosis not present

## 2020-05-21 DIAGNOSIS — D509 Iron deficiency anemia, unspecified: Secondary | ICD-10-CM | POA: Diagnosis not present

## 2020-05-21 DIAGNOSIS — N186 End stage renal disease: Secondary | ICD-10-CM | POA: Diagnosis not present

## 2020-05-22 DIAGNOSIS — N2581 Secondary hyperparathyroidism of renal origin: Secondary | ICD-10-CM | POA: Diagnosis not present

## 2020-05-22 DIAGNOSIS — N186 End stage renal disease: Secondary | ICD-10-CM | POA: Diagnosis not present

## 2020-05-22 DIAGNOSIS — D631 Anemia in chronic kidney disease: Secondary | ICD-10-CM | POA: Diagnosis not present

## 2020-05-22 DIAGNOSIS — D509 Iron deficiency anemia, unspecified: Secondary | ICD-10-CM | POA: Diagnosis not present

## 2020-05-23 DIAGNOSIS — D631 Anemia in chronic kidney disease: Secondary | ICD-10-CM | POA: Diagnosis not present

## 2020-05-23 DIAGNOSIS — N186 End stage renal disease: Secondary | ICD-10-CM | POA: Diagnosis not present

## 2020-05-23 DIAGNOSIS — N2581 Secondary hyperparathyroidism of renal origin: Secondary | ICD-10-CM | POA: Diagnosis not present

## 2020-05-23 DIAGNOSIS — D509 Iron deficiency anemia, unspecified: Secondary | ICD-10-CM | POA: Diagnosis not present

## 2020-05-24 ENCOUNTER — Other Ambulatory Visit: Payer: Self-pay

## 2020-05-24 ENCOUNTER — Ambulatory Visit (INDEPENDENT_AMBULATORY_CARE_PROVIDER_SITE_OTHER): Payer: Medicare HMO | Admitting: Internal Medicine

## 2020-05-24 ENCOUNTER — Ambulatory Visit (INDEPENDENT_AMBULATORY_CARE_PROVIDER_SITE_OTHER): Payer: Medicare HMO

## 2020-05-24 ENCOUNTER — Encounter: Payer: Self-pay | Admitting: Internal Medicine

## 2020-05-24 VITALS — BP 102/70 | HR 81 | Temp 98.3°F | Ht 62.0 in | Wt 136.4 lb

## 2020-05-24 DIAGNOSIS — N2581 Secondary hyperparathyroidism of renal origin: Secondary | ICD-10-CM | POA: Diagnosis not present

## 2020-05-24 DIAGNOSIS — E118 Type 2 diabetes mellitus with unspecified complications: Secondary | ICD-10-CM | POA: Diagnosis not present

## 2020-05-24 DIAGNOSIS — I639 Cerebral infarction, unspecified: Secondary | ICD-10-CM

## 2020-05-24 DIAGNOSIS — I69351 Hemiplegia and hemiparesis following cerebral infarction affecting right dominant side: Secondary | ICD-10-CM

## 2020-05-24 DIAGNOSIS — I129 Hypertensive chronic kidney disease with stage 1 through stage 4 chronic kidney disease, or unspecified chronic kidney disease: Secondary | ICD-10-CM

## 2020-05-24 DIAGNOSIS — Z0000 Encounter for general adult medical examination without abnormal findings: Secondary | ICD-10-CM

## 2020-05-24 DIAGNOSIS — D509 Iron deficiency anemia, unspecified: Secondary | ICD-10-CM | POA: Diagnosis not present

## 2020-05-24 DIAGNOSIS — N186 End stage renal disease: Secondary | ICD-10-CM | POA: Diagnosis not present

## 2020-05-24 DIAGNOSIS — N185 Chronic kidney disease, stage 5: Secondary | ICD-10-CM

## 2020-05-24 DIAGNOSIS — D631 Anemia in chronic kidney disease: Secondary | ICD-10-CM | POA: Diagnosis not present

## 2020-05-24 NOTE — Progress Notes (Addendum)
Subjective:   Mary Valencia is a 64 y.o. female who presents for Medicare Annual (Subsequent) preventive examination.  Review of Systems    No ROS. Medicare Wellness Visit. Additional risk factors are reflected in social history. Cardiac Risk Factors include: advanced age (>69men, >77 women);diabetes mellitus;dyslipidemia;family history of premature cardiovascular disease;hypertension     Objective:    Today's Vitals   05/24/20 1037  BP: 102/70  Pulse: 81  Temp: 98.3 F (36.8 C)  SpO2: 96%  Weight: 136 lb 6.4 oz (61.9 kg)  Height: $Remove'5\' 2"'IRAbBux$  (1.575 m)  PainSc: 0-No pain   Body mass index is 24.95 kg/m.  Advanced Directives 05/24/2020 04/04/2019 04/02/2018 11/27/2016 01/29/2012  Does Patient Have a Medical Advance Directive? Yes No No No Patient does not have advance directive  Type of Advance Directive Dane  Does patient want to make changes to medical advance directive? No - Patient declined Yes (ED - Information included in AVS) Yes (ED - Information included in AVS) - -  Would patient like information on creating a medical advance directive? - - - No - Patient declined -    Current Medications (verified) Outpatient Encounter Medications as of 05/24/2020  Medication Sig   ACCU-CHEK FASTCLIX LANCETS MISC Use to check blood glucose TID (DX: E11.8, Z79.4)   Alcohol Swabs (B-D SINGLE USE SWABS REGULAR) PADS Use as directed   atorvastatin (LIPITOR) 40 MG tablet Take 1 tablet (40 mg total) by mouth daily.   Blood Glucose Calibration (ACCU-CHEK SMARTVIEW CONTROL) LIQD Use as directed   Blood Glucose Monitoring Suppl (ACCU-CHEK NANO SMARTVIEW) w/Device KIT Use to check blood glucose TID (DX: E11.8, Z79.4)   calcitRIOL (ROCALTROL) 0.25 MCG capsule Take 0.25 mcg by mouth daily.   calcium acetate, Phos Binder, (PHOSLYRA) 667 MG/5ML SOLN Take 667 mg by mouth 3 (three) times daily with meals.   calcium carbonate (TUMS - DOSED IN MG ELEMENTAL CALCIUM) 500 MG  chewable tablet    CALCIUM-MAGNESIUM-VITAMIN D PO Take 1 tablet by mouth daily.   cinacalcet (SENSIPAR) 30 MG tablet Take 30 mg by mouth daily.   Continuous Blood Gluc Receiver (FREESTYLE LIBRE 14 DAY READER) DEVI 1 Units by Does not apply route daily.   Continuous Blood Gluc Sensor (FREESTYLE LIBRE 14 DAY SENSOR) MISC 1 Units by Does not apply route every 14 (fourteen) days.   diphenhydrAMINE (BENADRYL) 25 mg capsule Take 25 mg by mouth as needed.   glucose blood test strip TEST BLOOD GLUCOSE THREE TIMES DAILY   hydrALAZINE (APRESOLINE) 100 MG tablet Take 1 tablet (100 mg total) by mouth 3 (three) times daily.   Insulin NPH Human, Isophane, (NOVOLIN N [place]) Inject 2 Doses into the skin. 5 mg in morning, 7 mg at night   labetalol (NORMODYNE) 200 MG tablet Take 1 tablet (200 mg total) by mouth 3 (three) times daily.   losartan (COZAAR) 50 MG tablet Take 1 tablet (50 mg total) by mouth daily. (Patient taking differently: Take 50 mg by mouth in the morning and at bedtime. )   mupirocin ointment (BACTROBAN) 2 % On leg wound w/dressing change qd or bid   NIFEdipine (ADALAT CC) 60 MG 24 hr tablet    NOVOLOG 100 UNIT/ML injection Inject 7 Units into the skin in the morning and at bedtime.   omeprazole (PRILOSEC) 40 MG capsule Take 1 capsule (40 mg total) by mouth daily.   polyethylene glycol (MIRALAX / GLYCOLAX) packet Take 17 g by mouth.  PRESCRIPTION MEDICATION 210 mg 3 (three) times daily before meals.    senna-docusate (SENOKOT-S) 8.6-50 MG tablet Take by mouth 2 (two) times daily. Take 2 tab twice daily.   talc powder Apply topically as needed. Under breasts   torsemide (DEMADEX) 20 MG tablet Take 40 mg by mouth.    [DISCONTINUED] NIFEdipine (PROCARDIA XL/NIFEDICAL XL) 60 MG 24 hr tablet Take 1 tablet (60 mg total) by mouth 2 (two) times daily. (Patient taking differently: Take 60 mg by mouth in the morning, at noon, and at bedtime. )   No facility-administered encounter medications on file as of  05/24/2020.    Allergies (verified) Betadine [povidone iodine], Clonidine derivatives, Gabapentin, Iodine, Penicillins, Sulfonamide derivatives, Tetracyclines & related, and Naproxen   History: Past Medical History:  Diagnosis Date   Allergic rhinitis    Chronic kidney disease    currently on dialysis   Edema 10/27/2012   L>R   GERD (gastroesophageal reflux disease)    HTN (hypertension)    Hyperlipidemia    Type II or unspecified type diabetes mellitus without mention of complication, not stated as uncontrolled    Past Surgical History:  Procedure Laterality Date   ABDOMINAL HYSTERECTOMY     partial   KIDNEY SURGERY     right removed   NEPHRECTOMY     right - related to untreated strep infection and stones   TONSILLECTOMY     Family History  Problem Relation Age of Onset   Heart disease Mother 50       MI   Heart disease Father 10       MI   Alcohol abuse Sister    Kidney disease Sister    Heart disease Brother        ?CHF ?CAD   Heart disease Maternal Aunt    Diabetes Other    Social History   Socioeconomic History   Marital status: Married    Spouse name: Not on file   Number of children: 5   Years of education: Not on file   Highest education level: Not on file  Occupational History   Occupation: disability  Tobacco Use   Smoking status: Never Smoker   Smokeless tobacco: Never Used  Building services engineer Use: Never used  Substance and Sexual Activity   Alcohol use: No   Drug use: No   Sexual activity: Yes  Other Topics Concern   Not on file  Social History Narrative   Getting Masters in counseling, 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   Moved to [place]    Social Determinants of Health   Financial Resource Strain: Low Risk    Difficulty of Paying Living Expenses: Not hard at all  Food Insecurity: No Food Insecurity   Worried About Programme researcher, broadcasting/film/video in the Last Year: Never true   Barista in the Last Year: Never true  Transportation Needs: No  Transportation Needs   Lack of Transportation (Medical): No   Lack of Transportation (Non-Medical): No  Physical Activity: Inactive   Days of Exercise per Week: 0 days   Minutes of Exercise per Session: 0 min  Stress: No Stress Concern Present   Feeling of Stress : Not at all  Social Connections: Socially Isolated   Frequency of Communication with Friends and Family: More than three times a week   Frequency of Social Gatherings with Friends and Family: Once a week   Attends Religious Services: Never   Production manager of [employer]  or Organizations: No   Attends Archivist Meetings: Never   Marital Status: Never married    Tobacco Counseling Counseling given: Not Answered   Clinical Intake:  Pre-visit preparation completed: Yes  Pain : No/denies pain Pain Score: 0-No pain     BMI - recorded: 24.95 Nutritional Status: BMI of 19-24  Normal Nutritional Risks: None Diabetes: Yes CBG done?: No Did pt. bring in CBG monitor from home?: No  How often do you need to have someone help you when you read instructions, pamphlets, or other written materials from your doctor or pharmacy?: 1 - Never What is the last grade level you completed in school?: Bachelopr's Degree  Diabetic? yes  Interpreter Needed?: No  Information entered by :: Lisette Abu, LPN   Activities of Daily Living In your present state of health, do you have any difficulty performing the following activities: 05/24/2020 04/06/2020  Hearing? N N  Vision? N N  Difficulty concentrating or making decisions? N N  Walking or climbing stairs? Y Y  Dressing or bathing? Y N  Doing errands, shopping? Y N  Preparing Food and eating ? Y -  Using the Toilet? Y -  In the past six months, have you accidently leaked urine? Y -  Comment wears adult Depends for protection -  Do you have problems with loss of bowel control? N -  Managing your Medications? N -  Managing your Finances? N -  Housekeeping or managing  your Housekeeping? Y -  Some recent data might be hidden    Patient Care Team: Plotnikov, Evie Lacks, MD as PCP - General Ninfa Linden Lind Guest, MD (Orthopedic Surgery) Boyd Kerbs, MD as Referring Physician (Neurology) Mezer, Nadara Mustard, MD (Obstetrics and Gynecology) Thereasa Distance, MD as Referring Physician (Nephrology) Curly Rim, MD as Referring Physician (Internal Medicine)  Indicate any recent Medical Services you may have received from other than [facility] providers in the past year (date may be approximate).     Assessment:   This is a routine wellness examination for Kaiulani.  Hearing/Vision screen No exam data present  Dietary issues and exercise activities discussed: Current Exercise Habits: The patient does not participate in regular exercise at present, Exercise limited by: cardiac condition(s);respiratory conditions(s)  Goals      Become active again in my church     Volunteer in my church I will am starting this week.      Patient Stated     Stay as healthy and as independent as possible.       Depression Screen PHQ 2/9 Scores 05/24/2020 04/06/2020 04/04/2019 04/02/2018 03/28/2017 11/27/2016  PHQ - 2 Score 0 0 1 1 0 0  PHQ- 9 Score - - 3 5 - -    Fall Risk Fall Risk  05/24/2020 04/06/2020 04/04/2019 04/02/2018 03/28/2017  Falls in the past year? 0 1 0 No Yes  Number falls in past yr: 0 0 0 - 1  Injury with Fall? 0 0 0 - No  Risk for fall due to : No Fall Risks No Fall Risks History of fall(s);Impaired balance/gait;Impaired mobility Impaired balance/gait -  Follow up Falls evaluation completed Falls evaluation completed Falls prevention discussed - -    Any stairs in or around the home? Yes  If so, are there any without handrails? No  Home free of loose throw rugs in walkways, pet beds, electrical cords, etc? Yes  Adequate lighting in your home to reduce risk of falls? Yes   ASSISTIVE DEVICES UTILIZED TO PREVENT  FALLS:  Life alert? No  Use of a  cane, walker or w/c? Yes  Grab bars in the bathroom? No  Shower chair or bench in shower? Yes  Elevated toilet seat or a handicapped toilet? Yes   TIMED UP AND GO:  Was the test performed? No .  Length of time to ambulate 10 feet: 0 sec.   Gait steady and fast with assistive device (patient was in a wheelchair)  Cognitive Function:     6CIT Screen 05/24/2020  What Year? 0 points  What month? 0 points  What time? 0 points  Count back from 20 0 points  Months in reverse 0 points  Repeat phrase 0 points  Total Score 0    Immunizations Immunization History  Administered Date(s) Administered   Fluad Quad(high Dose 65+) 04/26/2020   Influenza Whole 05/16/2005   Influenza-Unspecified 03/26/2018   Pneumococcal Polysaccharide-23 10/08/2016   Td 09/26/2004, 08/25/2011, 02/24/2017    TDAP status: Up to date Flu Vaccine status: Up to date Pneumococcal vaccine status: Up to date Covid-19 vaccine status: Declined, Education has been provided regarding the importance of this vaccine but patient still declined. Advised may receive this vaccine at local pharmacy or Health Dept.or vaccine clinic. Aware to provide a copy of the vaccination record if obtained from local pharmacy or Health Dept. Verbalized acceptance and understanding.  Qualifies for Shingles Vaccine? Yes   Zostavax completed No   Shingrix Completed?: No.    Education has been provided regarding the importance of this vaccine. Patient has been advised to call insurance company to determine out of pocket expense if they have not yet received this vaccine. Advised may also receive vaccine at local pharmacy or Health Dept. Verbalized acceptance and understanding.  Screening Tests Health Maintenance  Topic Date Due   OPHTHALMOLOGY EXAM  Never done   HIV Screening  Never done   HEMOGLOBIN A1C  03/24/2018   MAMMOGRAM  10/10/2018   FOOT EXAM  04/03/2019   DEXA SCAN  Never done   PNA vac Low Risk Adult (1 of 2 - PCV13)  06/13/2019   COVID-19 Vaccine (1) 06/09/2020 (Originally 06/12/1968)   COLONOSCOPY  12/16/2026   TETANUS/TDAP  02/25/2027   INFLUENZA VACCINE  Completed   Hepatitis C Screening  Completed    Health Maintenance  Health Maintenance Due  Topic Date Due   OPHTHALMOLOGY EXAM  Never done   HIV Screening  Never done   HEMOGLOBIN A1C  03/24/2018   MAMMOGRAM  10/10/2018   FOOT EXAM  04/03/2019   DEXA SCAN  Never done   PNA vac Low Risk Adult (1 of 2 - PCV13) 06/13/2019    Colorectal cancer screening: Completed 12/15/2016. Repeat every 3 years (per patient) Mammogram status: Completed 10/09/2016. Repeat every year Bone density status: never done  Lung Cancer Screening: (Low Dose CT Chest recommended if Age 20-80 years, 30 pack-year currently smoking OR have quit w/in 15years.) does not qualify.   Lung Cancer Screening Referral: no  Additional Screening:  Hepatitis C Screening: does qualify; Completed yes  Vision Screening: Recommended annual ophthalmology exams for early detection of glaucoma and other disorders of the eye. Is the patient up to date with their annual eye exam?  No  Who is the provider or what is the name of the office in which the patient attends annual eye exams? Patient refused referral  If pt is not established with a provider, would they like to be referred to a provider to establish care? No .  Dental Screening: Recommended annual dental exams for proper oral hygiene  Community Resource Referral / Chronic Care Management: CRR required this visit?  No   CCM required this visit?  No      Plan:     I have personally reviewed and noted the following in the patient's chart:   Medical and social history Use of alcohol, tobacco or illicit drugs  Current medications and supplements Functional ability and status Nutritional status Physical activity Advanced directives List of other physicians Hospitalizations, surgeries, and ER visits in previous 12  months Vitals Screenings to include cognitive, depression, and falls Referrals and appointments  In addition, I have reviewed and discussed with patient certain preventive protocols, quality metrics, and best practice recommendations. A written personalized care plan for preventive services as well as general preventive health recommendations were provided to patient.     Sheral Flow, LPN   [id number]   Nurse Notes: Patient refused referral for eye exam and mammography.  Medical screening examination/treatment/procedure(s) were performed by non-physician practitioner and as supervising physician I was immediately available for consultation/collaboration.  I agree with above. Lew Dawes, MD

## 2020-05-24 NOTE — Assessment & Plan Note (Signed)
Novolog, Levemir

## 2020-05-24 NOTE — Assessment & Plan Note (Signed)
R hemiparesis. In PT Lipitor, baby ASA. BP control

## 2020-05-24 NOTE — Assessment & Plan Note (Signed)
On PD

## 2020-05-24 NOTE — Assessment & Plan Note (Addendum)
ON PD for ESRD Labetalol, Nifedipine

## 2020-05-24 NOTE — Patient Instructions (Addendum)
[name] , Thank you for taking time to come for your Medicare Wellness Visit. I appreciate your ongoing commitment to your health goals. Please review the following plan we discussed and let me know if I can assist you in the future.   Screening recommendations/referrals: Colonoscopy: 12/15/2016; due very 3 years Mammogram: 10/09/2016 Bone Density: never done Recommended yearly ophthalmology/optometry visit for glaucoma screening and checkup Recommended yearly dental visit for hygiene and checkup  Vaccinations: Influenza vaccine: 04/2020 (per patient) Pneumococcal vaccine: need Prevnar 13 Tdap vaccine: 02/24/2017; due every 10 years Shingles vaccine: never done   Covid-19: never done  Advanced directives: Please bring a copy of your health care power of attorney and living will to the office at your convenience.  Conditions/risks identified: Yes. Reviewed health maintenance screenings with patient today and relevant education, vaccines, and/or referrals were provided. Continue doing brain stimulating activities (puzzles, reading, adult coloring books, staying active) to keep memory sharp. Continue to eat heart healthy diet (full of fruits, vegetables, whole grains, lean protein, water--limit salt, fat, and sugar intake) and increase physical activity as tolerated.   Next appointment: Please schedule your next Medicare Wellness Visit with your Nurse Health Advisor in 1 year by calling [phone].   Preventive Care 71 Years and Older, Female Preventive care refers to lifestyle choices and visits with your health care provider that can promote health and wellness. What does preventive care include?  A yearly physical exam. This is also called an annual well check.  Dental exams once or twice a year.  Routine eye exams. Ask your health care provider how often you should have your eyes checked.  Personal lifestyle choices, including:  Daily care of your teeth and gums.  Regular  physical activity.  Eating a healthy diet.  Avoiding tobacco and drug use.  Limiting alcohol use.  Practicing safe sex.  Taking low-dose aspirin every day.  Taking vitamin and mineral supplements as recommended by your health care provider. What happens during an annual well check? The services and screenings done by your health care provider during your annual well check will depend on your age, overall health, lifestyle risk factors, and family history of disease. Counseling  Your health care provider may ask you questions about your:  Alcohol use.  Tobacco use.  Drug use.  Emotional well-being.  Home and relationship well-being.  Sexual activity.  Eating habits.  History of falls.  Memory and ability to understand (cognition).  Work and work Statistician.  Reproductive health. Screening  You may have the following tests or measurements:  Height, weight, and BMI.  Blood pressure.  Lipid and cholesterol levels. These may be checked every 5 years, or more frequently if you are over 54 years old.  Skin check.  Lung cancer screening. You may have this screening every year starting at age 31 if you have a 30-pack-year history of smoking and currently smoke or have quit within the past 15 years.  Fecal occult blood test (FOBT) of the stool. You may have this test every year starting at age 7.  Flexible sigmoidoscopy or colonoscopy. You may have a sigmoidoscopy every 5 years or a colonoscopy every 10 years starting at age 54.  Hepatitis C blood test.  Hepatitis B blood test.  Sexually transmitted disease (STD) testing.  Diabetes screening. This is done by checking your blood sugar (glucose) after you have not eaten for a while (fasting). You may have this done every 1-3 years.  Bone density scan. This is done  to screen for osteoporosis. You may have this done starting at age 21.  Mammogram. This may be done every 1-2 years. Talk to your health care  provider about how often you should have regular mammograms. Talk with your health care provider about your test results, treatment options, and if necessary, the need for more tests. Vaccines  Your health care provider may recommend certain vaccines, such as:  Influenza vaccine. This is recommended every year.  Tetanus, diphtheria, and acellular pertussis (Tdap, Td) vaccine. You may need a Td booster every 10 years.  Zoster vaccine. You may need this after age 47.  Pneumococcal 13-valent conjugate (PCV13) vaccine. One dose is recommended after age 24.  Pneumococcal polysaccharide (PPSV23) vaccine. One dose is recommended after age 91. Talk to your health care provider about which screenings and vaccines you need and how often you need them. This information is not intended to replace advice given to you by your health care provider. Make sure you discuss any questions you have with your health care provider. Document Released: 07/09/2015 Document Revised: 03/01/2016 Document Reviewed: 04/13/2015 Elsevier Interactive Patient Education  2017 [place] Prevention in the Home Falls can cause injuries. They can happen to people of all ages. There are many things you can do to make your home safe and to help prevent falls. What can I do on the outside of my home?  Regularly fix the edges of walkways and driveways and fix any cracks.  Remove anything that might make you trip as you walk through a door, such as a raised step or threshold.  Trim any bushes or trees on the path to your home.  Use bright outdoor lighting.  Clear any walking paths of anything that might make someone trip, such as rocks or tools.  Regularly check to see if handrails are loose or broken. Make sure that both sides of any steps have handrails.  Any raised decks and porches should have guardrails on the edges.  Have any leaves, snow, or ice cleared regularly.  Use sand or salt on walking paths  during winter.  Clean up any spills in your garage right away. This includes oil or grease spills. What can I do in the bathroom?  Use night lights.  Install grab bars by the toilet and in the tub and shower. Do not use towel bars as grab bars.  Use non-skid mats or decals in the tub or shower.  If you need to sit down in the shower, use a plastic, non-slip stool.  Keep the floor dry. Clean up any water that spills on the floor as soon as it happens.  Remove soap buildup in the tub or shower regularly.  Attach bath mats securely with double-sided non-slip rug tape.  Do not have throw rugs and other things on the floor that can make you trip. What can I do in the bedroom?  Use night lights.  Make sure that you have a light by your bed that is easy to reach.  Do not use any sheets or blankets that are too big for your bed. They should not hang down onto the floor.  Have a firm chair that has side arms. You can use this for support while you get dressed.  Do not have throw rugs and other things on the floor that can make you trip. What can I do in the kitchen?  Clean up any spills right away.  Avoid walking on wet floors.  Keep items  that you use a lot in easy-to-reach places.  If you need to reach something above you, use a strong step stool that has a grab bar.  Keep electrical cords out of the way.  Do not use floor polish or wax that makes floors slippery. If you must use wax, use non-skid floor wax.  Do not have throw rugs and other things on the floor that can make you trip. What can I do with my stairs?  Do not leave any items on the stairs.  Make sure that there are handrails on both sides of the stairs and use them. Fix handrails that are broken or loose. Make sure that handrails are as long as the stairways.  Check any carpeting to make sure that it is firmly attached to the stairs. Fix any carpet that is loose or worn.  Avoid having throw rugs at the top  or bottom of the stairs. If you do have throw rugs, attach them to the floor with carpet tape.  Make sure that you have a light switch at the top of the stairs and the bottom of the stairs. If you do not have them, ask someone to add them for you. What else can I do to help prevent falls?  Wear shoes that:  Do not have high heels.  Have rubber bottoms.  Are comfortable and fit you well.  Are closed at the toe. Do not wear sandals.  If you use a stepladder:  Make sure that it is fully opened. Do not climb a closed stepladder.  Make sure that both sides of the stepladder are locked into place.  Ask someone to hold it for you, if possible.  Clearly mark and make sure that you can see:  Any grab bars or handrails.  First and last steps.  Where the edge of each step is.  Use tools that help you move around (mobility aids) if they are needed. These include:  Canes.  Walkers.  Scooters.  Crutches.  Turn on the lights when you go into a dark area. Replace any light bulbs as soon as they burn out.  Set up your furniture so you have a clear path. Avoid moving your furniture around.  If any of your floors are uneven, fix them.  If there are any pets around you, be aware of where they are.  Review your medicines with your doctor. Some medicines can make you feel dizzy. This can increase your chance of falling. Ask your doctor what other things that you can do to help prevent falls. This information is not intended to replace advice given to you by your health care provider. Make sure you discuss any questions you have with your health care provider. Document Released: 04/08/2009 Document Revised: 11/18/2015 Document Reviewed: 07/17/2014 Elsevier Interactive Patient Education  2017 [employer].

## 2020-05-24 NOTE — Progress Notes (Signed)
Subjective:  Patient ID: Mary Valencia, female    DOB: 07-Jul-1955  Age: 64 y.o. MRN: [id number]  CC: Follow-up (4 MONTH F/U)   HPI Mary Valencia presents for HTN post - ER f/u, ESRD on PD x 2 years (1 year on HD). She is hear w/her son and a CMA  Outpatient Medications Prior to Visit  Medication Sig Dispense Refill  . ACCU-CHEK FASTCLIX LANCETS MISC Use to check blood glucose TID (DX: E11.8, Z79.4) 300 each 3  . Alcohol Swabs (B-D SINGLE USE SWABS REGULAR) PADS Use as directed 100 each 3  . atorvastatin (LIPITOR) 40 MG tablet Take 1 tablet (40 mg total) by mouth daily. 90 tablet 3  . Blood Glucose Calibration (ACCU-CHEK SMARTVIEW CONTROL) LIQD Use as directed 1 each 1  . Blood Glucose Monitoring Suppl (ACCU-CHEK NANO SMARTVIEW) w/Device KIT Use to check blood glucose TID (DX: E11.8, Z79.4) 1 kit 0  . calcitRIOL (ROCALTROL) 0.25 MCG capsule Take 0.25 mcg by mouth daily.    . calcium acetate, Phos Binder, (PHOSLYRA) 667 MG/5ML SOLN Take 667 mg by mouth 3 (three) times daily with meals.    . calcium carbonate (TUMS - DOSED IN MG ELEMENTAL CALCIUM) 500 MG chewable tablet     . CALCIUM-MAGNESIUM-VITAMIN D PO Take 1 tablet by mouth daily.    . cinacalcet (SENSIPAR) 30 MG tablet Take 30 mg by mouth daily.    . Continuous Blood Gluc Receiver (FREESTYLE LIBRE 14 DAY READER) DEVI 1 Units by Does not apply route daily. 1 each 1  . Continuous Blood Gluc Sensor (FREESTYLE LIBRE 14 DAY SENSOR) MISC 1 Units by Does not apply route every 14 (fourteen) days. 6 each 3  . diphenhydrAMINE (BENADRYL) 25 mg capsule Take 25 mg by mouth as needed.    Marland Kitchen glucose blood test strip TEST BLOOD GLUCOSE THREE TIMES DAILY 300 each 3  . hydrALAZINE (APRESOLINE) 100 MG tablet Take 1 tablet (100 mg total) by mouth 3 (three) times daily. 270 tablet 3  . Insulin NPH Human, Isophane, (NOVOLIN N [place]) Inject 2 Doses into the skin. 5 mg in morning, 7 mg at night    . labetalol (NORMODYNE) 200 MG tablet Take 1 tablet (200 mg  total) by mouth 3 (three) times daily. 90 tablet 3  . losartan (COZAAR) 50 MG tablet Take 1 tablet (50 mg total) by mouth daily. (Patient taking differently: Take 50 mg by mouth in the morning and at bedtime. ) 90 tablet 3  . mupirocin ointment (BACTROBAN) 2 % On leg wound w/dressing change qd or bid 30 g 1  . NIFEdipine (ADALAT CC) 60 MG 24 hr tablet     . NIFEdipine (PROCARDIA XL/NIFEDICAL XL) 60 MG 24 hr tablet Take 1 tablet (60 mg total) by mouth 2 (two) times daily. (Patient taking differently: Take 60 mg by mouth in the morning, at noon, and at bedtime. ) 180 tablet 3  . NOVOLOG 100 UNIT/ML injection Inject 7 Units into the skin in the morning and at bedtime. 10 mL 11  . omeprazole (PRILOSEC) 40 MG capsule Take 1 capsule (40 mg total) by mouth daily. 90 capsule 3  . polyethylene glycol (MIRALAX / GLYCOLAX) packet Take 17 g by mouth.    Marland Kitchen PRESCRIPTION MEDICATION 210 mg 3 (three) times daily before meals.     . senna-docusate (SENOKOT-S) 8.6-50 MG tablet Take by mouth 2 (two) times daily. Take 2 tab twice daily.    Marland Kitchen talc powder Apply topically as needed.  Under breasts 240 g 0  . torsemide (DEMADEX) 20 MG tablet Take 40 mg by mouth.      No facility-administered medications prior to visit.    ROS: Review of Systems  Constitutional: Positive for fatigue. Negative for activity change, appetite change, chills and unexpected weight change.  HENT: Negative for congestion, mouth sores and sinus pressure.   Eyes: Negative for visual disturbance.  Respiratory: Negative for cough and chest tightness.   Gastrointestinal: Negative for abdominal pain and nausea.  Genitourinary: Negative for difficulty urinating, frequency and vaginal pain.  Musculoskeletal: Positive for gait problem. Negative for back pain.  Skin: Negative for pallor and rash.  Neurological: Positive for weakness. Negative for dizziness, tremors, numbness and headaches.  Psychiatric/Behavioral: Positive for decreased  concentration. Negative for confusion and sleep disturbance.    Objective:  BP 102/70 (BP Location: Left Arm)   Pulse 81   Temp 98.3 F (36.8 C) (Oral)   Wt 136 lb (61.7 kg)   SpO2 96%   BMI 24.87 kg/m   BP Readings from Last 3 Encounters:  05/24/20 102/70  05/24/20 102/70  04/06/20 (!) 150/72    Wt Readings from Last 3 Encounters:  05/24/20 136 lb (61.7 kg)  05/24/20 136 lb 6.4 oz (61.9 kg)  01/21/20 134 lb (60.8 kg)    Physical Exam Constitutional:      General: She is not in acute distress.    Appearance: She is well-developed.  HENT:     Head: Normocephalic.     Right Ear: External ear normal.     Left Ear: External ear normal.     Nose: Nose normal.  Eyes:     General:        Right eye: No discharge.        Left eye: No discharge.     Conjunctiva/sclera: Conjunctivae normal.     Pupils: Pupils are equal, round, and reactive to light.  Neck:     Thyroid: No thyromegaly.     Vascular: No JVD.     Trachea: No tracheal deviation.  Cardiovascular:     Rate and Rhythm: Normal rate and regular rhythm.     Heart sounds: Normal heart sounds.  Pulmonary:     Effort: No respiratory distress.     Breath sounds: No stridor. No wheezing.  Abdominal:     General: Bowel sounds are normal. There is no distension.     Palpations: Abdomen is soft. There is no mass.     Tenderness: There is no abdominal tenderness. There is no guarding or rebound.  Musculoskeletal:        General: No tenderness.     Cervical back: Normal range of motion and neck supple.  Lymphadenopathy:     Cervical: No cervical adenopathy.  Skin:    Findings: No erythema or rash.  Neurological:     Mental Status: She is oriented to person, place, and time.     Cranial Nerves: No cranial nerve deficit.     Motor: No abnormal muscle tone.     Coordination: Coordination abnormal.     Deep Tendon Reflexes: Reflexes normal.  Psychiatric:        Behavior: Behavior normal.        Thought Content:  Thought content normal.        Judgment: Judgment normal.    In a w/c   Lab Results  Component Value Date   WBC 7.7 08/29/2017   HGB 29.1 09/01/2017   HCT 30 (A)  08/29/2017   PLT 203 08/29/2017   GLUCOSE 279 (H) 09/21/2017   CHOL 161 09/21/2017   TRIG 72.0 09/21/2017   HDL 63.60 09/21/2017   LDLDIRECT 207.8 09/29/2010   LDLCALC 83 09/21/2017   ALT 6 09/21/2017   AST 21 09/21/2017   NA 141 09/21/2017   K 4.9 09/21/2017   CL 98 09/21/2017   CREATININE 13.49 (HH) 09/21/2017   BUN 66 (H) 09/21/2017   CO2 27 09/21/2017   TSH 2.81 09/21/2017   HGBA1C 7.1 (H) 09/21/2017    [place] LT LOWER EXTREM LTD SOFT TISSUE NON VASCULAR  Result Date: 08/30/2017 CLINICAL DATA:  Ganglion cyst. Lump on the LEFT LATERAL LOWER leg for 7 weeks. Masses occasionally tender and varies in size. EXAM: ULTRASOUND LEFT LOWER EXTREMITY LIMITED TECHNIQUE: Ultrasound examination of the lower extremity soft tissues was performed in the area of clinical concern. COMPARISON:  None. FINDINGS: Soft Tissue Structures: There is mild edema within the subcutaneous tissue in the area of patient's concern. No mass or fluid collection. No cystic abnormality. IMPRESSION: Mild soft tissue edema.  No mass. Electronically Signed   By: Nolon Nations M.D.   On: 08/30/2017 17:03    Assessment & Plan:    Walker Kehr, MD

## 2020-05-25 DIAGNOSIS — D509 Iron deficiency anemia, unspecified: Secondary | ICD-10-CM | POA: Diagnosis not present

## 2020-05-25 DIAGNOSIS — U071 COVID-19: Secondary | ICD-10-CM | POA: Diagnosis not present

## 2020-05-25 DIAGNOSIS — E1140 Type 2 diabetes mellitus with diabetic neuropathy, unspecified: Secondary | ICD-10-CM | POA: Diagnosis not present

## 2020-05-25 DIAGNOSIS — N2581 Secondary hyperparathyroidism of renal origin: Secondary | ICD-10-CM | POA: Diagnosis not present

## 2020-05-25 DIAGNOSIS — N186 End stage renal disease: Secondary | ICD-10-CM | POA: Diagnosis not present

## 2020-05-25 DIAGNOSIS — I120 Hypertensive chronic kidney disease with stage 5 chronic kidney disease or end stage renal disease: Secondary | ICD-10-CM | POA: Diagnosis not present

## 2020-05-25 DIAGNOSIS — D631 Anemia in chronic kidney disease: Secondary | ICD-10-CM | POA: Diagnosis not present

## 2020-05-25 DIAGNOSIS — L89312 Pressure ulcer of right buttock, stage 2: Secondary | ICD-10-CM | POA: Diagnosis not present

## 2020-05-25 DIAGNOSIS — Z992 Dependence on renal dialysis: Secondary | ICD-10-CM | POA: Diagnosis not present

## 2020-05-25 DIAGNOSIS — E1122 Type 2 diabetes mellitus with diabetic chronic kidney disease: Secondary | ICD-10-CM | POA: Diagnosis not present

## 2020-05-25 DIAGNOSIS — E782 Mixed hyperlipidemia: Secondary | ICD-10-CM | POA: Diagnosis not present

## 2020-05-25 DIAGNOSIS — E559 Vitamin D deficiency, unspecified: Secondary | ICD-10-CM | POA: Diagnosis not present

## 2020-05-26 ENCOUNTER — Telehealth: Payer: Self-pay | Admitting: Internal Medicine

## 2020-05-26 DIAGNOSIS — D631 Anemia in chronic kidney disease: Secondary | ICD-10-CM | POA: Diagnosis not present

## 2020-05-26 DIAGNOSIS — D509 Iron deficiency anemia, unspecified: Secondary | ICD-10-CM | POA: Diagnosis not present

## 2020-05-26 DIAGNOSIS — E1140 Type 2 diabetes mellitus with diabetic neuropathy, unspecified: Secondary | ICD-10-CM | POA: Diagnosis not present

## 2020-05-26 DIAGNOSIS — U071 COVID-19: Secondary | ICD-10-CM | POA: Diagnosis not present

## 2020-05-26 DIAGNOSIS — E1122 Type 2 diabetes mellitus with diabetic chronic kidney disease: Secondary | ICD-10-CM | POA: Diagnosis not present

## 2020-05-26 DIAGNOSIS — E119 Type 2 diabetes mellitus without complications: Secondary | ICD-10-CM | POA: Diagnosis not present

## 2020-05-26 DIAGNOSIS — E782 Mixed hyperlipidemia: Secondary | ICD-10-CM | POA: Diagnosis not present

## 2020-05-26 DIAGNOSIS — N2581 Secondary hyperparathyroidism of renal origin: Secondary | ICD-10-CM | POA: Diagnosis not present

## 2020-05-26 DIAGNOSIS — N186 End stage renal disease: Secondary | ICD-10-CM | POA: Diagnosis not present

## 2020-05-26 DIAGNOSIS — L89312 Pressure ulcer of right buttock, stage 2: Secondary | ICD-10-CM | POA: Diagnosis not present

## 2020-05-26 DIAGNOSIS — I120 Hypertensive chronic kidney disease with stage 5 chronic kidney disease or end stage renal disease: Secondary | ICD-10-CM | POA: Diagnosis not present

## 2020-05-26 DIAGNOSIS — E559 Vitamin D deficiency, unspecified: Secondary | ICD-10-CM | POA: Diagnosis not present

## 2020-05-26 NOTE — Telephone Encounter (Signed)
    [place] Name: [facility] Agency Name: [facility] Callback Phone #: [phone], ok to leave a message Service Requested: extend OT Frequency of Visits: 2W2, [phone]

## 2020-05-27 DIAGNOSIS — N2581 Secondary hyperparathyroidism of renal origin: Secondary | ICD-10-CM | POA: Diagnosis not present

## 2020-05-27 DIAGNOSIS — N186 End stage renal disease: Secondary | ICD-10-CM | POA: Diagnosis not present

## 2020-05-27 DIAGNOSIS — D631 Anemia in chronic kidney disease: Secondary | ICD-10-CM | POA: Diagnosis not present

## 2020-05-27 DIAGNOSIS — D509 Iron deficiency anemia, unspecified: Secondary | ICD-10-CM | POA: Diagnosis not present

## 2020-05-27 NOTE — Telephone Encounter (Signed)
Okay.  Thanks.

## 2020-05-27 NOTE — Telephone Encounter (Signed)
Called Rosann Auerbach there was n answer LMOM w/MD response.Marland KitchenJohny Chess

## 2020-05-28 DIAGNOSIS — D631 Anemia in chronic kidney disease: Secondary | ICD-10-CM | POA: Diagnosis not present

## 2020-05-28 DIAGNOSIS — N186 End stage renal disease: Secondary | ICD-10-CM | POA: Diagnosis not present

## 2020-05-28 DIAGNOSIS — D509 Iron deficiency anemia, unspecified: Secondary | ICD-10-CM | POA: Diagnosis not present

## 2020-05-28 DIAGNOSIS — N2581 Secondary hyperparathyroidism of renal origin: Secondary | ICD-10-CM | POA: Diagnosis not present

## 2020-05-29 DIAGNOSIS — N2581 Secondary hyperparathyroidism of renal origin: Secondary | ICD-10-CM | POA: Diagnosis not present

## 2020-05-29 DIAGNOSIS — N186 End stage renal disease: Secondary | ICD-10-CM | POA: Diagnosis not present

## 2020-05-29 DIAGNOSIS — D509 Iron deficiency anemia, unspecified: Secondary | ICD-10-CM | POA: Diagnosis not present

## 2020-05-29 DIAGNOSIS — D631 Anemia in chronic kidney disease: Secondary | ICD-10-CM | POA: Diagnosis not present

## 2020-05-30 DIAGNOSIS — D631 Anemia in chronic kidney disease: Secondary | ICD-10-CM | POA: Diagnosis not present

## 2020-05-30 DIAGNOSIS — D509 Iron deficiency anemia, unspecified: Secondary | ICD-10-CM | POA: Diagnosis not present

## 2020-05-30 DIAGNOSIS — N2581 Secondary hyperparathyroidism of renal origin: Secondary | ICD-10-CM | POA: Diagnosis not present

## 2020-05-30 DIAGNOSIS — N186 End stage renal disease: Secondary | ICD-10-CM | POA: Diagnosis not present

## 2020-05-31 DIAGNOSIS — D509 Iron deficiency anemia, unspecified: Secondary | ICD-10-CM | POA: Diagnosis not present

## 2020-05-31 DIAGNOSIS — D631 Anemia in chronic kidney disease: Secondary | ICD-10-CM | POA: Diagnosis not present

## 2020-05-31 DIAGNOSIS — N2581 Secondary hyperparathyroidism of renal origin: Secondary | ICD-10-CM | POA: Diagnosis not present

## 2020-05-31 DIAGNOSIS — N186 End stage renal disease: Secondary | ICD-10-CM | POA: Diagnosis not present

## 2020-06-01 DIAGNOSIS — D509 Iron deficiency anemia, unspecified: Secondary | ICD-10-CM | POA: Diagnosis not present

## 2020-06-01 DIAGNOSIS — M1990 Unspecified osteoarthritis, unspecified site: Secondary | ICD-10-CM | POA: Diagnosis not present

## 2020-06-01 DIAGNOSIS — D631 Anemia in chronic kidney disease: Secondary | ICD-10-CM | POA: Diagnosis not present

## 2020-06-01 DIAGNOSIS — N186 End stage renal disease: Secondary | ICD-10-CM | POA: Diagnosis not present

## 2020-06-01 DIAGNOSIS — K219 Gastro-esophageal reflux disease without esophagitis: Secondary | ICD-10-CM | POA: Diagnosis not present

## 2020-06-01 DIAGNOSIS — E1122 Type 2 diabetes mellitus with diabetic chronic kidney disease: Secondary | ICD-10-CM | POA: Diagnosis not present

## 2020-06-01 DIAGNOSIS — E1140 Type 2 diabetes mellitus with diabetic neuropathy, unspecified: Secondary | ICD-10-CM | POA: Diagnosis not present

## 2020-06-01 DIAGNOSIS — I120 Hypertensive chronic kidney disease with stage 5 chronic kidney disease or end stage renal disease: Secondary | ICD-10-CM | POA: Diagnosis not present

## 2020-06-01 DIAGNOSIS — E559 Vitamin D deficiency, unspecified: Secondary | ICD-10-CM | POA: Diagnosis not present

## 2020-06-01 DIAGNOSIS — N2581 Secondary hyperparathyroidism of renal origin: Secondary | ICD-10-CM | POA: Diagnosis not present

## 2020-06-02 DIAGNOSIS — D631 Anemia in chronic kidney disease: Secondary | ICD-10-CM | POA: Diagnosis not present

## 2020-06-02 DIAGNOSIS — M1990 Unspecified osteoarthritis, unspecified site: Secondary | ICD-10-CM | POA: Diagnosis not present

## 2020-06-02 DIAGNOSIS — E1122 Type 2 diabetes mellitus with diabetic chronic kidney disease: Secondary | ICD-10-CM | POA: Diagnosis not present

## 2020-06-02 DIAGNOSIS — E1140 Type 2 diabetes mellitus with diabetic neuropathy, unspecified: Secondary | ICD-10-CM | POA: Diagnosis not present

## 2020-06-02 DIAGNOSIS — I120 Hypertensive chronic kidney disease with stage 5 chronic kidney disease or end stage renal disease: Secondary | ICD-10-CM | POA: Diagnosis not present

## 2020-06-02 DIAGNOSIS — N2581 Secondary hyperparathyroidism of renal origin: Secondary | ICD-10-CM | POA: Diagnosis not present

## 2020-06-02 DIAGNOSIS — E559 Vitamin D deficiency, unspecified: Secondary | ICD-10-CM | POA: Diagnosis not present

## 2020-06-02 DIAGNOSIS — D509 Iron deficiency anemia, unspecified: Secondary | ICD-10-CM | POA: Diagnosis not present

## 2020-06-02 DIAGNOSIS — K219 Gastro-esophageal reflux disease without esophagitis: Secondary | ICD-10-CM | POA: Diagnosis not present

## 2020-06-02 DIAGNOSIS — N186 End stage renal disease: Secondary | ICD-10-CM | POA: Diagnosis not present

## 2020-06-03 DIAGNOSIS — D509 Iron deficiency anemia, unspecified: Secondary | ICD-10-CM | POA: Diagnosis not present

## 2020-06-03 DIAGNOSIS — D631 Anemia in chronic kidney disease: Secondary | ICD-10-CM | POA: Diagnosis not present

## 2020-06-03 DIAGNOSIS — N186 End stage renal disease: Secondary | ICD-10-CM | POA: Diagnosis not present

## 2020-06-03 DIAGNOSIS — N2581 Secondary hyperparathyroidism of renal origin: Secondary | ICD-10-CM | POA: Diagnosis not present

## 2020-06-04 DIAGNOSIS — E1122 Type 2 diabetes mellitus with diabetic chronic kidney disease: Secondary | ICD-10-CM | POA: Diagnosis not present

## 2020-06-04 DIAGNOSIS — D509 Iron deficiency anemia, unspecified: Secondary | ICD-10-CM | POA: Diagnosis not present

## 2020-06-04 DIAGNOSIS — N186 End stage renal disease: Secondary | ICD-10-CM | POA: Diagnosis not present

## 2020-06-04 DIAGNOSIS — M1990 Unspecified osteoarthritis, unspecified site: Secondary | ICD-10-CM | POA: Diagnosis not present

## 2020-06-04 DIAGNOSIS — I120 Hypertensive chronic kidney disease with stage 5 chronic kidney disease or end stage renal disease: Secondary | ICD-10-CM | POA: Diagnosis not present

## 2020-06-04 DIAGNOSIS — D631 Anemia in chronic kidney disease: Secondary | ICD-10-CM | POA: Diagnosis not present

## 2020-06-04 DIAGNOSIS — N2581 Secondary hyperparathyroidism of renal origin: Secondary | ICD-10-CM | POA: Diagnosis not present

## 2020-06-04 DIAGNOSIS — K219 Gastro-esophageal reflux disease without esophagitis: Secondary | ICD-10-CM | POA: Diagnosis not present

## 2020-06-04 DIAGNOSIS — E559 Vitamin D deficiency, unspecified: Secondary | ICD-10-CM | POA: Diagnosis not present

## 2020-06-04 DIAGNOSIS — E1140 Type 2 diabetes mellitus with diabetic neuropathy, unspecified: Secondary | ICD-10-CM | POA: Diagnosis not present

## 2020-06-05 DIAGNOSIS — D509 Iron deficiency anemia, unspecified: Secondary | ICD-10-CM | POA: Diagnosis not present

## 2020-06-05 DIAGNOSIS — N186 End stage renal disease: Secondary | ICD-10-CM | POA: Diagnosis not present

## 2020-06-05 DIAGNOSIS — D631 Anemia in chronic kidney disease: Secondary | ICD-10-CM | POA: Diagnosis not present

## 2020-06-05 DIAGNOSIS — N2581 Secondary hyperparathyroidism of renal origin: Secondary | ICD-10-CM | POA: Diagnosis not present

## 2020-06-06 DIAGNOSIS — N2581 Secondary hyperparathyroidism of renal origin: Secondary | ICD-10-CM | POA: Diagnosis not present

## 2020-06-06 DIAGNOSIS — N186 End stage renal disease: Secondary | ICD-10-CM | POA: Diagnosis not present

## 2020-06-06 DIAGNOSIS — D631 Anemia in chronic kidney disease: Secondary | ICD-10-CM | POA: Diagnosis not present

## 2020-06-06 DIAGNOSIS — D509 Iron deficiency anemia, unspecified: Secondary | ICD-10-CM | POA: Diagnosis not present

## 2020-06-07 DIAGNOSIS — N186 End stage renal disease: Secondary | ICD-10-CM | POA: Diagnosis not present

## 2020-06-07 DIAGNOSIS — N2581 Secondary hyperparathyroidism of renal origin: Secondary | ICD-10-CM | POA: Diagnosis not present

## 2020-06-07 DIAGNOSIS — D509 Iron deficiency anemia, unspecified: Secondary | ICD-10-CM | POA: Diagnosis not present

## 2020-06-07 DIAGNOSIS — D631 Anemia in chronic kidney disease: Secondary | ICD-10-CM | POA: Diagnosis not present

## 2020-06-08 DIAGNOSIS — I120 Hypertensive chronic kidney disease with stage 5 chronic kidney disease or end stage renal disease: Secondary | ICD-10-CM | POA: Diagnosis not present

## 2020-06-08 DIAGNOSIS — N186 End stage renal disease: Secondary | ICD-10-CM | POA: Diagnosis not present

## 2020-06-08 DIAGNOSIS — E1140 Type 2 diabetes mellitus with diabetic neuropathy, unspecified: Secondary | ICD-10-CM | POA: Diagnosis not present

## 2020-06-08 DIAGNOSIS — M1990 Unspecified osteoarthritis, unspecified site: Secondary | ICD-10-CM | POA: Diagnosis not present

## 2020-06-08 DIAGNOSIS — D509 Iron deficiency anemia, unspecified: Secondary | ICD-10-CM | POA: Diagnosis not present

## 2020-06-08 DIAGNOSIS — D631 Anemia in chronic kidney disease: Secondary | ICD-10-CM | POA: Diagnosis not present

## 2020-06-08 DIAGNOSIS — E559 Vitamin D deficiency, unspecified: Secondary | ICD-10-CM | POA: Diagnosis not present

## 2020-06-08 DIAGNOSIS — N2581 Secondary hyperparathyroidism of renal origin: Secondary | ICD-10-CM | POA: Diagnosis not present

## 2020-06-08 DIAGNOSIS — E1122 Type 2 diabetes mellitus with diabetic chronic kidney disease: Secondary | ICD-10-CM | POA: Diagnosis not present

## 2020-06-08 DIAGNOSIS — K219 Gastro-esophageal reflux disease without esophagitis: Secondary | ICD-10-CM | POA: Diagnosis not present

## 2020-06-09 DIAGNOSIS — E1140 Type 2 diabetes mellitus with diabetic neuropathy, unspecified: Secondary | ICD-10-CM | POA: Diagnosis not present

## 2020-06-09 DIAGNOSIS — M1990 Unspecified osteoarthritis, unspecified site: Secondary | ICD-10-CM | POA: Diagnosis not present

## 2020-06-09 DIAGNOSIS — E1122 Type 2 diabetes mellitus with diabetic chronic kidney disease: Secondary | ICD-10-CM | POA: Diagnosis not present

## 2020-06-09 DIAGNOSIS — K219 Gastro-esophageal reflux disease without esophagitis: Secondary | ICD-10-CM | POA: Diagnosis not present

## 2020-06-09 DIAGNOSIS — D631 Anemia in chronic kidney disease: Secondary | ICD-10-CM | POA: Diagnosis not present

## 2020-06-09 DIAGNOSIS — I120 Hypertensive chronic kidney disease with stage 5 chronic kidney disease or end stage renal disease: Secondary | ICD-10-CM | POA: Diagnosis not present

## 2020-06-09 DIAGNOSIS — D509 Iron deficiency anemia, unspecified: Secondary | ICD-10-CM | POA: Diagnosis not present

## 2020-06-09 DIAGNOSIS — E559 Vitamin D deficiency, unspecified: Secondary | ICD-10-CM | POA: Diagnosis not present

## 2020-06-09 DIAGNOSIS — N186 End stage renal disease: Secondary | ICD-10-CM | POA: Diagnosis not present

## 2020-06-09 DIAGNOSIS — N2581 Secondary hyperparathyroidism of renal origin: Secondary | ICD-10-CM | POA: Diagnosis not present

## 2020-06-10 DIAGNOSIS — K219 Gastro-esophageal reflux disease without esophagitis: Secondary | ICD-10-CM | POA: Diagnosis not present

## 2020-06-10 DIAGNOSIS — I120 Hypertensive chronic kidney disease with stage 5 chronic kidney disease or end stage renal disease: Secondary | ICD-10-CM | POA: Diagnosis not present

## 2020-06-10 DIAGNOSIS — N2581 Secondary hyperparathyroidism of renal origin: Secondary | ICD-10-CM | POA: Diagnosis not present

## 2020-06-10 DIAGNOSIS — D631 Anemia in chronic kidney disease: Secondary | ICD-10-CM | POA: Diagnosis not present

## 2020-06-10 DIAGNOSIS — E559 Vitamin D deficiency, unspecified: Secondary | ICD-10-CM | POA: Diagnosis not present

## 2020-06-10 DIAGNOSIS — N186 End stage renal disease: Secondary | ICD-10-CM | POA: Diagnosis not present

## 2020-06-10 DIAGNOSIS — E1140 Type 2 diabetes mellitus with diabetic neuropathy, unspecified: Secondary | ICD-10-CM | POA: Diagnosis not present

## 2020-06-10 DIAGNOSIS — D509 Iron deficiency anemia, unspecified: Secondary | ICD-10-CM | POA: Diagnosis not present

## 2020-06-10 DIAGNOSIS — M1990 Unspecified osteoarthritis, unspecified site: Secondary | ICD-10-CM | POA: Diagnosis not present

## 2020-06-10 DIAGNOSIS — E1122 Type 2 diabetes mellitus with diabetic chronic kidney disease: Secondary | ICD-10-CM | POA: Diagnosis not present

## 2020-06-11 DIAGNOSIS — D509 Iron deficiency anemia, unspecified: Secondary | ICD-10-CM | POA: Diagnosis not present

## 2020-06-11 DIAGNOSIS — D631 Anemia in chronic kidney disease: Secondary | ICD-10-CM | POA: Diagnosis not present

## 2020-06-11 DIAGNOSIS — N2581 Secondary hyperparathyroidism of renal origin: Secondary | ICD-10-CM | POA: Diagnosis not present

## 2020-06-11 DIAGNOSIS — N186 End stage renal disease: Secondary | ICD-10-CM | POA: Diagnosis not present

## 2020-06-12 DIAGNOSIS — D631 Anemia in chronic kidney disease: Secondary | ICD-10-CM | POA: Diagnosis not present

## 2020-06-12 DIAGNOSIS — N2581 Secondary hyperparathyroidism of renal origin: Secondary | ICD-10-CM | POA: Diagnosis not present

## 2020-06-12 DIAGNOSIS — N186 End stage renal disease: Secondary | ICD-10-CM | POA: Diagnosis not present

## 2020-06-12 DIAGNOSIS — D509 Iron deficiency anemia, unspecified: Secondary | ICD-10-CM | POA: Diagnosis not present

## 2020-06-13 DIAGNOSIS — N186 End stage renal disease: Secondary | ICD-10-CM | POA: Diagnosis not present

## 2020-06-13 DIAGNOSIS — D509 Iron deficiency anemia, unspecified: Secondary | ICD-10-CM | POA: Diagnosis not present

## 2020-06-13 DIAGNOSIS — N2581 Secondary hyperparathyroidism of renal origin: Secondary | ICD-10-CM | POA: Diagnosis not present

## 2020-06-13 DIAGNOSIS — D631 Anemia in chronic kidney disease: Secondary | ICD-10-CM | POA: Diagnosis not present

## 2020-06-14 DIAGNOSIS — E1140 Type 2 diabetes mellitus with diabetic neuropathy, unspecified: Secondary | ICD-10-CM | POA: Diagnosis not present

## 2020-06-14 DIAGNOSIS — K219 Gastro-esophageal reflux disease without esophagitis: Secondary | ICD-10-CM | POA: Diagnosis not present

## 2020-06-14 DIAGNOSIS — E559 Vitamin D deficiency, unspecified: Secondary | ICD-10-CM | POA: Diagnosis not present

## 2020-06-14 DIAGNOSIS — N186 End stage renal disease: Secondary | ICD-10-CM | POA: Diagnosis not present

## 2020-06-14 DIAGNOSIS — I120 Hypertensive chronic kidney disease with stage 5 chronic kidney disease or end stage renal disease: Secondary | ICD-10-CM | POA: Diagnosis not present

## 2020-06-14 DIAGNOSIS — E1122 Type 2 diabetes mellitus with diabetic chronic kidney disease: Secondary | ICD-10-CM | POA: Diagnosis not present

## 2020-06-14 DIAGNOSIS — D631 Anemia in chronic kidney disease: Secondary | ICD-10-CM | POA: Diagnosis not present

## 2020-06-14 DIAGNOSIS — M1990 Unspecified osteoarthritis, unspecified site: Secondary | ICD-10-CM | POA: Diagnosis not present

## 2020-06-14 DIAGNOSIS — N2581 Secondary hyperparathyroidism of renal origin: Secondary | ICD-10-CM | POA: Diagnosis not present

## 2020-06-14 DIAGNOSIS — D509 Iron deficiency anemia, unspecified: Secondary | ICD-10-CM | POA: Diagnosis not present

## 2020-06-15 DIAGNOSIS — E1140 Type 2 diabetes mellitus with diabetic neuropathy, unspecified: Secondary | ICD-10-CM | POA: Diagnosis not present

## 2020-06-15 DIAGNOSIS — D631 Anemia in chronic kidney disease: Secondary | ICD-10-CM | POA: Diagnosis not present

## 2020-06-15 DIAGNOSIS — N186 End stage renal disease: Secondary | ICD-10-CM | POA: Diagnosis not present

## 2020-06-15 DIAGNOSIS — N2581 Secondary hyperparathyroidism of renal origin: Secondary | ICD-10-CM | POA: Diagnosis not present

## 2020-06-15 DIAGNOSIS — M1990 Unspecified osteoarthritis, unspecified site: Secondary | ICD-10-CM | POA: Diagnosis not present

## 2020-06-15 DIAGNOSIS — I120 Hypertensive chronic kidney disease with stage 5 chronic kidney disease or end stage renal disease: Secondary | ICD-10-CM | POA: Diagnosis not present

## 2020-06-15 DIAGNOSIS — D509 Iron deficiency anemia, unspecified: Secondary | ICD-10-CM | POA: Diagnosis not present

## 2020-06-15 DIAGNOSIS — K219 Gastro-esophageal reflux disease without esophagitis: Secondary | ICD-10-CM | POA: Diagnosis not present

## 2020-06-15 DIAGNOSIS — E1122 Type 2 diabetes mellitus with diabetic chronic kidney disease: Secondary | ICD-10-CM | POA: Diagnosis not present

## 2020-06-15 DIAGNOSIS — E559 Vitamin D deficiency, unspecified: Secondary | ICD-10-CM | POA: Diagnosis not present

## 2020-06-16 DIAGNOSIS — E1122 Type 2 diabetes mellitus with diabetic chronic kidney disease: Secondary | ICD-10-CM | POA: Diagnosis not present

## 2020-06-16 DIAGNOSIS — E1140 Type 2 diabetes mellitus with diabetic neuropathy, unspecified: Secondary | ICD-10-CM | POA: Diagnosis not present

## 2020-06-16 DIAGNOSIS — N2581 Secondary hyperparathyroidism of renal origin: Secondary | ICD-10-CM | POA: Diagnosis not present

## 2020-06-16 DIAGNOSIS — E559 Vitamin D deficiency, unspecified: Secondary | ICD-10-CM | POA: Diagnosis not present

## 2020-06-16 DIAGNOSIS — M1990 Unspecified osteoarthritis, unspecified site: Secondary | ICD-10-CM | POA: Diagnosis not present

## 2020-06-16 DIAGNOSIS — I120 Hypertensive chronic kidney disease with stage 5 chronic kidney disease or end stage renal disease: Secondary | ICD-10-CM | POA: Diagnosis not present

## 2020-06-16 DIAGNOSIS — D631 Anemia in chronic kidney disease: Secondary | ICD-10-CM | POA: Diagnosis not present

## 2020-06-16 DIAGNOSIS — N186 End stage renal disease: Secondary | ICD-10-CM | POA: Diagnosis not present

## 2020-06-16 DIAGNOSIS — D509 Iron deficiency anemia, unspecified: Secondary | ICD-10-CM | POA: Diagnosis not present

## 2020-06-16 DIAGNOSIS — K219 Gastro-esophageal reflux disease without esophagitis: Secondary | ICD-10-CM | POA: Diagnosis not present

## 2020-06-17 DIAGNOSIS — D509 Iron deficiency anemia, unspecified: Secondary | ICD-10-CM | POA: Diagnosis not present

## 2020-06-17 DIAGNOSIS — N2581 Secondary hyperparathyroidism of renal origin: Secondary | ICD-10-CM | POA: Diagnosis not present

## 2020-06-17 DIAGNOSIS — N186 End stage renal disease: Secondary | ICD-10-CM | POA: Diagnosis not present

## 2020-06-17 DIAGNOSIS — D631 Anemia in chronic kidney disease: Secondary | ICD-10-CM | POA: Diagnosis not present

## 2020-06-18 DIAGNOSIS — D631 Anemia in chronic kidney disease: Secondary | ICD-10-CM | POA: Diagnosis not present

## 2020-06-18 DIAGNOSIS — N186 End stage renal disease: Secondary | ICD-10-CM | POA: Diagnosis not present

## 2020-06-18 DIAGNOSIS — N2581 Secondary hyperparathyroidism of renal origin: Secondary | ICD-10-CM | POA: Diagnosis not present

## 2020-06-18 DIAGNOSIS — D509 Iron deficiency anemia, unspecified: Secondary | ICD-10-CM | POA: Diagnosis not present

## 2020-06-19 DIAGNOSIS — N2581 Secondary hyperparathyroidism of renal origin: Secondary | ICD-10-CM | POA: Diagnosis not present

## 2020-06-19 DIAGNOSIS — D509 Iron deficiency anemia, unspecified: Secondary | ICD-10-CM | POA: Diagnosis not present

## 2020-06-19 DIAGNOSIS — N186 End stage renal disease: Secondary | ICD-10-CM | POA: Diagnosis not present

## 2020-06-19 DIAGNOSIS — D631 Anemia in chronic kidney disease: Secondary | ICD-10-CM | POA: Diagnosis not present

## 2020-06-20 DIAGNOSIS — D631 Anemia in chronic kidney disease: Secondary | ICD-10-CM | POA: Diagnosis not present

## 2020-06-20 DIAGNOSIS — N2581 Secondary hyperparathyroidism of renal origin: Secondary | ICD-10-CM | POA: Diagnosis not present

## 2020-06-20 DIAGNOSIS — N186 End stage renal disease: Secondary | ICD-10-CM | POA: Diagnosis not present

## 2020-06-20 DIAGNOSIS — D509 Iron deficiency anemia, unspecified: Secondary | ICD-10-CM | POA: Diagnosis not present

## 2020-06-21 DIAGNOSIS — D631 Anemia in chronic kidney disease: Secondary | ICD-10-CM | POA: Diagnosis not present

## 2020-06-21 DIAGNOSIS — D509 Iron deficiency anemia, unspecified: Secondary | ICD-10-CM | POA: Diagnosis not present

## 2020-06-21 DIAGNOSIS — N2581 Secondary hyperparathyroidism of renal origin: Secondary | ICD-10-CM | POA: Diagnosis not present

## 2020-06-21 DIAGNOSIS — N186 End stage renal disease: Secondary | ICD-10-CM | POA: Diagnosis not present

## 2020-06-22 DIAGNOSIS — D631 Anemia in chronic kidney disease: Secondary | ICD-10-CM | POA: Diagnosis not present

## 2020-06-22 DIAGNOSIS — D509 Iron deficiency anemia, unspecified: Secondary | ICD-10-CM | POA: Diagnosis not present

## 2020-06-22 DIAGNOSIS — N2581 Secondary hyperparathyroidism of renal origin: Secondary | ICD-10-CM | POA: Diagnosis not present

## 2020-06-22 DIAGNOSIS — N186 End stage renal disease: Secondary | ICD-10-CM | POA: Diagnosis not present

## 2020-06-23 DIAGNOSIS — N186 End stage renal disease: Secondary | ICD-10-CM | POA: Diagnosis not present

## 2020-06-23 DIAGNOSIS — D509 Iron deficiency anemia, unspecified: Secondary | ICD-10-CM | POA: Diagnosis not present

## 2020-06-23 DIAGNOSIS — N2581 Secondary hyperparathyroidism of renal origin: Secondary | ICD-10-CM | POA: Diagnosis not present

## 2020-06-23 DIAGNOSIS — D631 Anemia in chronic kidney disease: Secondary | ICD-10-CM | POA: Diagnosis not present

## 2020-06-24 DIAGNOSIS — D631 Anemia in chronic kidney disease: Secondary | ICD-10-CM | POA: Diagnosis not present

## 2020-06-24 DIAGNOSIS — N186 End stage renal disease: Secondary | ICD-10-CM | POA: Diagnosis not present

## 2020-06-24 DIAGNOSIS — N2581 Secondary hyperparathyroidism of renal origin: Secondary | ICD-10-CM | POA: Diagnosis not present

## 2020-06-24 DIAGNOSIS — D509 Iron deficiency anemia, unspecified: Secondary | ICD-10-CM | POA: Diagnosis not present

## 2020-06-25 DIAGNOSIS — N2581 Secondary hyperparathyroidism of renal origin: Secondary | ICD-10-CM | POA: Diagnosis not present

## 2020-06-25 DIAGNOSIS — D509 Iron deficiency anemia, unspecified: Secondary | ICD-10-CM | POA: Diagnosis not present

## 2020-06-25 DIAGNOSIS — Z992 Dependence on renal dialysis: Secondary | ICD-10-CM | POA: Diagnosis not present

## 2020-06-25 DIAGNOSIS — N186 End stage renal disease: Secondary | ICD-10-CM | POA: Diagnosis not present

## 2020-06-25 DIAGNOSIS — D631 Anemia in chronic kidney disease: Secondary | ICD-10-CM | POA: Diagnosis not present

## 2020-06-26 DIAGNOSIS — D631 Anemia in chronic kidney disease: Secondary | ICD-10-CM | POA: Diagnosis not present

## 2020-06-26 DIAGNOSIS — N186 End stage renal disease: Secondary | ICD-10-CM | POA: Diagnosis not present

## 2020-06-26 DIAGNOSIS — N2581 Secondary hyperparathyroidism of renal origin: Secondary | ICD-10-CM | POA: Diagnosis not present

## 2020-06-27 DIAGNOSIS — N186 End stage renal disease: Secondary | ICD-10-CM | POA: Diagnosis not present

## 2020-06-27 DIAGNOSIS — D631 Anemia in chronic kidney disease: Secondary | ICD-10-CM | POA: Diagnosis not present

## 2020-06-27 DIAGNOSIS — N2581 Secondary hyperparathyroidism of renal origin: Secondary | ICD-10-CM | POA: Diagnosis not present

## 2020-06-28 DIAGNOSIS — N2581 Secondary hyperparathyroidism of renal origin: Secondary | ICD-10-CM | POA: Diagnosis not present

## 2020-06-28 DIAGNOSIS — N186 End stage renal disease: Secondary | ICD-10-CM | POA: Diagnosis not present

## 2020-06-28 DIAGNOSIS — D631 Anemia in chronic kidney disease: Secondary | ICD-10-CM | POA: Diagnosis not present

## 2020-06-29 DIAGNOSIS — E119 Type 2 diabetes mellitus without complications: Secondary | ICD-10-CM | POA: Diagnosis not present

## 2020-06-29 DIAGNOSIS — N186 End stage renal disease: Secondary | ICD-10-CM | POA: Diagnosis not present

## 2020-06-29 DIAGNOSIS — D631 Anemia in chronic kidney disease: Secondary | ICD-10-CM | POA: Diagnosis not present

## 2020-06-29 DIAGNOSIS — N2581 Secondary hyperparathyroidism of renal origin: Secondary | ICD-10-CM | POA: Diagnosis not present

## 2020-06-30 DIAGNOSIS — N2581 Secondary hyperparathyroidism of renal origin: Secondary | ICD-10-CM | POA: Diagnosis not present

## 2020-06-30 DIAGNOSIS — D631 Anemia in chronic kidney disease: Secondary | ICD-10-CM | POA: Diagnosis not present

## 2020-06-30 DIAGNOSIS — N186 End stage renal disease: Secondary | ICD-10-CM | POA: Diagnosis not present

## 2020-07-01 DIAGNOSIS — D631 Anemia in chronic kidney disease: Secondary | ICD-10-CM | POA: Diagnosis not present

## 2020-07-01 DIAGNOSIS — N2581 Secondary hyperparathyroidism of renal origin: Secondary | ICD-10-CM | POA: Diagnosis not present

## 2020-07-01 DIAGNOSIS — N186 End stage renal disease: Secondary | ICD-10-CM | POA: Diagnosis not present

## 2020-07-02 DIAGNOSIS — N186 End stage renal disease: Secondary | ICD-10-CM | POA: Diagnosis not present

## 2020-07-02 DIAGNOSIS — D631 Anemia in chronic kidney disease: Secondary | ICD-10-CM | POA: Diagnosis not present

## 2020-07-02 DIAGNOSIS — N2581 Secondary hyperparathyroidism of renal origin: Secondary | ICD-10-CM | POA: Diagnosis not present

## 2020-07-03 DIAGNOSIS — N186 End stage renal disease: Secondary | ICD-10-CM | POA: Diagnosis not present

## 2020-07-03 DIAGNOSIS — N2581 Secondary hyperparathyroidism of renal origin: Secondary | ICD-10-CM | POA: Diagnosis not present

## 2020-07-03 DIAGNOSIS — D631 Anemia in chronic kidney disease: Secondary | ICD-10-CM | POA: Diagnosis not present

## 2020-07-04 DIAGNOSIS — N186 End stage renal disease: Secondary | ICD-10-CM | POA: Diagnosis not present

## 2020-07-04 DIAGNOSIS — D631 Anemia in chronic kidney disease: Secondary | ICD-10-CM | POA: Diagnosis not present

## 2020-07-04 DIAGNOSIS — N2581 Secondary hyperparathyroidism of renal origin: Secondary | ICD-10-CM | POA: Diagnosis not present

## 2020-07-05 DIAGNOSIS — N186 End stage renal disease: Secondary | ICD-10-CM | POA: Diagnosis not present

## 2020-07-05 DIAGNOSIS — N2581 Secondary hyperparathyroidism of renal origin: Secondary | ICD-10-CM | POA: Diagnosis not present

## 2020-07-05 DIAGNOSIS — D631 Anemia in chronic kidney disease: Secondary | ICD-10-CM | POA: Diagnosis not present

## 2020-07-06 DIAGNOSIS — N2581 Secondary hyperparathyroidism of renal origin: Secondary | ICD-10-CM | POA: Diagnosis not present

## 2020-07-06 DIAGNOSIS — N186 End stage renal disease: Secondary | ICD-10-CM | POA: Diagnosis not present

## 2020-07-06 DIAGNOSIS — D631 Anemia in chronic kidney disease: Secondary | ICD-10-CM | POA: Diagnosis not present

## 2020-07-07 DIAGNOSIS — D631 Anemia in chronic kidney disease: Secondary | ICD-10-CM | POA: Diagnosis not present

## 2020-07-07 DIAGNOSIS — N2581 Secondary hyperparathyroidism of renal origin: Secondary | ICD-10-CM | POA: Diagnosis not present

## 2020-07-07 DIAGNOSIS — N186 End stage renal disease: Secondary | ICD-10-CM | POA: Diagnosis not present

## 2020-07-08 DIAGNOSIS — D631 Anemia in chronic kidney disease: Secondary | ICD-10-CM | POA: Diagnosis not present

## 2020-07-08 DIAGNOSIS — N2581 Secondary hyperparathyroidism of renal origin: Secondary | ICD-10-CM | POA: Diagnosis not present

## 2020-07-08 DIAGNOSIS — N186 End stage renal disease: Secondary | ICD-10-CM | POA: Diagnosis not present

## 2020-07-09 DIAGNOSIS — N186 End stage renal disease: Secondary | ICD-10-CM | POA: Diagnosis not present

## 2020-07-09 DIAGNOSIS — N2581 Secondary hyperparathyroidism of renal origin: Secondary | ICD-10-CM | POA: Diagnosis not present

## 2020-07-09 DIAGNOSIS — D631 Anemia in chronic kidney disease: Secondary | ICD-10-CM | POA: Diagnosis not present

## 2020-07-10 DIAGNOSIS — N186 End stage renal disease: Secondary | ICD-10-CM | POA: Diagnosis not present

## 2020-07-10 DIAGNOSIS — N2581 Secondary hyperparathyroidism of renal origin: Secondary | ICD-10-CM | POA: Diagnosis not present

## 2020-07-10 DIAGNOSIS — D631 Anemia in chronic kidney disease: Secondary | ICD-10-CM | POA: Diagnosis not present

## 2020-07-11 DIAGNOSIS — D631 Anemia in chronic kidney disease: Secondary | ICD-10-CM | POA: Diagnosis not present

## 2020-07-11 DIAGNOSIS — N2581 Secondary hyperparathyroidism of renal origin: Secondary | ICD-10-CM | POA: Diagnosis not present

## 2020-07-11 DIAGNOSIS — N186 End stage renal disease: Secondary | ICD-10-CM | POA: Diagnosis not present

## 2020-07-12 DIAGNOSIS — N2581 Secondary hyperparathyroidism of renal origin: Secondary | ICD-10-CM | POA: Diagnosis not present

## 2020-07-12 DIAGNOSIS — D631 Anemia in chronic kidney disease: Secondary | ICD-10-CM | POA: Diagnosis not present

## 2020-07-12 DIAGNOSIS — N186 End stage renal disease: Secondary | ICD-10-CM | POA: Diagnosis not present

## 2020-07-13 DIAGNOSIS — D631 Anemia in chronic kidney disease: Secondary | ICD-10-CM | POA: Diagnosis not present

## 2020-07-13 DIAGNOSIS — N186 End stage renal disease: Secondary | ICD-10-CM | POA: Diagnosis not present

## 2020-07-13 DIAGNOSIS — N2581 Secondary hyperparathyroidism of renal origin: Secondary | ICD-10-CM | POA: Diagnosis not present

## 2020-07-14 DIAGNOSIS — D631 Anemia in chronic kidney disease: Secondary | ICD-10-CM | POA: Diagnosis not present

## 2020-07-14 DIAGNOSIS — N186 End stage renal disease: Secondary | ICD-10-CM | POA: Diagnosis not present

## 2020-07-14 DIAGNOSIS — N2581 Secondary hyperparathyroidism of renal origin: Secondary | ICD-10-CM | POA: Diagnosis not present

## 2020-07-15 DIAGNOSIS — N186 End stage renal disease: Secondary | ICD-10-CM | POA: Diagnosis not present

## 2020-07-15 DIAGNOSIS — N2581 Secondary hyperparathyroidism of renal origin: Secondary | ICD-10-CM | POA: Diagnosis not present

## 2020-07-15 DIAGNOSIS — D631 Anemia in chronic kidney disease: Secondary | ICD-10-CM | POA: Diagnosis not present

## 2020-07-16 DIAGNOSIS — N186 End stage renal disease: Secondary | ICD-10-CM | POA: Diagnosis not present

## 2020-07-16 DIAGNOSIS — N2581 Secondary hyperparathyroidism of renal origin: Secondary | ICD-10-CM | POA: Diagnosis not present

## 2020-07-16 DIAGNOSIS — D631 Anemia in chronic kidney disease: Secondary | ICD-10-CM | POA: Diagnosis not present

## 2020-07-17 DIAGNOSIS — D631 Anemia in chronic kidney disease: Secondary | ICD-10-CM | POA: Diagnosis not present

## 2020-07-17 DIAGNOSIS — N2581 Secondary hyperparathyroidism of renal origin: Secondary | ICD-10-CM | POA: Diagnosis not present

## 2020-07-17 DIAGNOSIS — N186 End stage renal disease: Secondary | ICD-10-CM | POA: Diagnosis not present

## 2020-07-18 DIAGNOSIS — D631 Anemia in chronic kidney disease: Secondary | ICD-10-CM | POA: Diagnosis not present

## 2020-07-18 DIAGNOSIS — N186 End stage renal disease: Secondary | ICD-10-CM | POA: Diagnosis not present

## 2020-07-18 DIAGNOSIS — N2581 Secondary hyperparathyroidism of renal origin: Secondary | ICD-10-CM | POA: Diagnosis not present

## 2020-07-19 ENCOUNTER — Other Ambulatory Visit: Payer: Self-pay

## 2020-07-19 DIAGNOSIS — N2581 Secondary hyperparathyroidism of renal origin: Secondary | ICD-10-CM | POA: Diagnosis not present

## 2020-07-19 DIAGNOSIS — D631 Anemia in chronic kidney disease: Secondary | ICD-10-CM | POA: Diagnosis not present

## 2020-07-19 DIAGNOSIS — E118 Type 2 diabetes mellitus with unspecified complications: Secondary | ICD-10-CM

## 2020-07-19 DIAGNOSIS — N186 End stage renal disease: Secondary | ICD-10-CM | POA: Diagnosis not present

## 2020-07-19 MED ORDER — GLUCOSE BLOOD VI STRP: ORAL_STRIP | 3 refills | Status: DC

## 2020-07-20 ENCOUNTER — Other Ambulatory Visit: Payer: Self-pay | Admitting: *Deleted

## 2020-07-20 DIAGNOSIS — N186 End stage renal disease: Secondary | ICD-10-CM | POA: Diagnosis not present

## 2020-07-20 DIAGNOSIS — D631 Anemia in chronic kidney disease: Secondary | ICD-10-CM | POA: Diagnosis not present

## 2020-07-20 DIAGNOSIS — N2581 Secondary hyperparathyroidism of renal origin: Secondary | ICD-10-CM | POA: Diagnosis not present

## 2020-07-20 MED ORDER — TRUEPLUS LANCETS 33G MISC: 3 refills | Status: DC

## 2020-07-20 MED ORDER — TRUE METRIX BLOOD GLUCOSE TEST VI STRP: ORAL_STRIP | 3 refills | Status: DC

## 2020-07-20 MED ORDER — TRUE METRIX METER W/DEVICE KIT: PACK | 0 refills | Status: DC

## 2020-07-21 ENCOUNTER — Other Ambulatory Visit: Payer: Self-pay

## 2020-07-21 ENCOUNTER — Emergency Department (HOSPITAL_BASED_OUTPATIENT_CLINIC_OR_DEPARTMENT_OTHER)
Admission: EM | Admit: 2020-07-21 | Discharge: 2020-07-21 | Disposition: A | Discharge status: Home / Self Care | Payer: Medicare HMO | Attending: Emergency Medicine | Admitting: Emergency Medicine

## 2020-07-21 ENCOUNTER — Other Ambulatory Visit: Payer: Self-pay | Admitting: Internal Medicine

## 2020-07-21 ENCOUNTER — Encounter (HOSPITAL_BASED_OUTPATIENT_CLINIC_OR_DEPARTMENT_OTHER): Payer: Self-pay | Admitting: Emergency Medicine

## 2020-07-21 DIAGNOSIS — T148XXA Other injury of unspecified body region, initial encounter: Secondary | ICD-10-CM

## 2020-07-21 DIAGNOSIS — N186 End stage renal disease: Secondary | ICD-10-CM | POA: Diagnosis not present

## 2020-07-21 DIAGNOSIS — Z794 Long term (current) use of insulin: Secondary | ICD-10-CM | POA: Insufficient documentation

## 2020-07-21 DIAGNOSIS — M79604 Pain in right leg: Secondary | ICD-10-CM

## 2020-07-21 DIAGNOSIS — N189 Chronic kidney disease, unspecified: Secondary | ICD-10-CM | POA: Diagnosis not present

## 2020-07-21 DIAGNOSIS — S8012XA Contusion of left lower leg, initial encounter: Secondary | ICD-10-CM | POA: Diagnosis not present

## 2020-07-21 DIAGNOSIS — E1122 Type 2 diabetes mellitus with diabetic chronic kidney disease: Secondary | ICD-10-CM | POA: Diagnosis not present

## 2020-07-21 DIAGNOSIS — I129 Hypertensive chronic kidney disease with stage 1 through stage 4 chronic kidney disease, or unspecified chronic kidney disease: Secondary | ICD-10-CM | POA: Diagnosis not present

## 2020-07-21 DIAGNOSIS — S8010XA Contusion of unspecified lower leg, initial encounter: Secondary | ICD-10-CM | POA: Diagnosis not present

## 2020-07-21 DIAGNOSIS — N2581 Secondary hyperparathyroidism of renal origin: Secondary | ICD-10-CM | POA: Diagnosis not present

## 2020-07-21 DIAGNOSIS — I2510 Atherosclerotic heart disease of native coronary artery without angina pectoris: Secondary | ICD-10-CM | POA: Diagnosis not present

## 2020-07-21 DIAGNOSIS — Z79899 Other long term (current) drug therapy: Secondary | ICD-10-CM | POA: Diagnosis not present

## 2020-07-21 DIAGNOSIS — D631 Anemia in chronic kidney disease: Secondary | ICD-10-CM | POA: Diagnosis not present

## 2020-07-21 DIAGNOSIS — S8011XA Contusion of right lower leg, initial encounter: Secondary | ICD-10-CM | POA: Diagnosis not present

## 2020-07-21 DIAGNOSIS — S8990XA Unspecified injury of unspecified lower leg, initial encounter: Secondary | ICD-10-CM | POA: Diagnosis present

## 2020-07-21 NOTE — Discharge Instructions (Signed)
Follow the RICE (Rest, Ice, Compression, Elevation) protocol as directed.   You can take Tylenol or Ibuprofen as directed for pain. You can alternate Tylenol and Ibuprofen every 4 hours. If you take Tylenol at 1pm, then you can take Ibuprofen at 5pm. Then you can take Tylenol again at 9pm.   Follow-up with your primary care doctor.  Return the emergency department for any worsening pain, warmth, redness over the area, fevers or any other worsening concerning symptoms.
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
## 2020-07-22 ENCOUNTER — Telehealth: Payer: Self-pay | Admitting: Internal Medicine

## 2020-07-22 DIAGNOSIS — N2581 Secondary hyperparathyroidism of renal origin: Secondary | ICD-10-CM | POA: Diagnosis not present

## 2020-07-22 DIAGNOSIS — N186 End stage renal disease: Secondary | ICD-10-CM | POA: Diagnosis not present

## 2020-07-22 DIAGNOSIS — D631 Anemia in chronic kidney disease: Secondary | ICD-10-CM | POA: Diagnosis not present

## 2020-07-22 NOTE — Telephone Encounter (Signed)
   Patient requesting FL2 be completed and faxed to DSS, attn: Hazle Quant Phone [phone] Fax  [phone]

## 2020-07-23 DIAGNOSIS — D631 Anemia in chronic kidney disease: Secondary | ICD-10-CM | POA: Diagnosis not present

## 2020-07-23 DIAGNOSIS — N2581 Secondary hyperparathyroidism of renal origin: Secondary | ICD-10-CM | POA: Diagnosis not present

## 2020-07-23 DIAGNOSIS — N186 End stage renal disease: Secondary | ICD-10-CM | POA: Diagnosis not present

## 2020-07-24 DIAGNOSIS — N186 End stage renal disease: Secondary | ICD-10-CM | POA: Diagnosis not present

## 2020-07-24 DIAGNOSIS — D631 Anemia in chronic kidney disease: Secondary | ICD-10-CM | POA: Diagnosis not present

## 2020-07-24 DIAGNOSIS — N2581 Secondary hyperparathyroidism of renal origin: Secondary | ICD-10-CM | POA: Diagnosis not present

## 2020-07-25 DIAGNOSIS — N186 End stage renal disease: Secondary | ICD-10-CM | POA: Diagnosis not present

## 2020-07-25 DIAGNOSIS — D631 Anemia in chronic kidney disease: Secondary | ICD-10-CM | POA: Diagnosis not present

## 2020-07-25 DIAGNOSIS — N2581 Secondary hyperparathyroidism of renal origin: Secondary | ICD-10-CM | POA: Diagnosis not present

## 2020-07-26 DIAGNOSIS — N186 End stage renal disease: Secondary | ICD-10-CM | POA: Diagnosis not present

## 2020-07-26 DIAGNOSIS — N2581 Secondary hyperparathyroidism of renal origin: Secondary | ICD-10-CM | POA: Diagnosis not present

## 2020-07-26 DIAGNOSIS — D631 Anemia in chronic kidney disease: Secondary | ICD-10-CM | POA: Diagnosis not present

## 2020-07-26 DIAGNOSIS — Z992 Dependence on renal dialysis: Secondary | ICD-10-CM | POA: Diagnosis not present

## 2020-07-26 NOTE — Telephone Encounter (Signed)
Called pt son Jenny Reichmann, [place]) bck he states they are trying to get mom some in home service through social services. Her case worker states she need a FL2 form fax to her. Inform son MD was out of the office on Friday, but will fill-put form and fax it to Mrs. Duanne Limerick once it is completed. Jenny Reichmann is also wanting to get a copy. Inform him will call him once form is completed.Marland KitchenJohny Chess

## 2020-07-26 NOTE — Telephone Encounter (Signed)
Place form on MD desk to complete../lmb 

## 2020-07-26 NOTE — Telephone Encounter (Signed)
MD signed form faxed to DSS Mrs. Duanne Limerick and notified Jenny Reichmann he can pick up original. Left at the front desk.Marland KitchenJohny Chess

## 2020-07-26 NOTE — Telephone Encounter (Signed)
Patients son called and was wondering the status of the FL2 forms. Please call son back at [phone].

## 2020-07-27 DIAGNOSIS — N2581 Secondary hyperparathyroidism of renal origin: Secondary | ICD-10-CM | POA: Diagnosis not present

## 2020-07-27 DIAGNOSIS — D631 Anemia in chronic kidney disease: Secondary | ICD-10-CM | POA: Diagnosis not present

## 2020-07-27 DIAGNOSIS — N186 End stage renal disease: Secondary | ICD-10-CM | POA: Diagnosis not present

## 2020-07-27 DIAGNOSIS — D509 Iron deficiency anemia, unspecified: Secondary | ICD-10-CM | POA: Diagnosis not present

## 2020-07-27 NOTE — Telephone Encounter (Signed)
Noted. Thanks.

## 2020-07-28 DIAGNOSIS — D631 Anemia in chronic kidney disease: Secondary | ICD-10-CM | POA: Diagnosis not present

## 2020-07-28 DIAGNOSIS — N186 End stage renal disease: Secondary | ICD-10-CM | POA: Diagnosis not present

## 2020-07-28 DIAGNOSIS — N2581 Secondary hyperparathyroidism of renal origin: Secondary | ICD-10-CM | POA: Diagnosis not present

## 2020-07-28 DIAGNOSIS — D509 Iron deficiency anemia, unspecified: Secondary | ICD-10-CM | POA: Diagnosis not present

## 2020-07-29 DIAGNOSIS — D509 Iron deficiency anemia, unspecified: Secondary | ICD-10-CM | POA: Diagnosis not present

## 2020-07-29 DIAGNOSIS — D631 Anemia in chronic kidney disease: Secondary | ICD-10-CM | POA: Diagnosis not present

## 2020-07-29 DIAGNOSIS — N186 End stage renal disease: Secondary | ICD-10-CM | POA: Diagnosis not present

## 2020-07-29 DIAGNOSIS — N2581 Secondary hyperparathyroidism of renal origin: Secondary | ICD-10-CM | POA: Diagnosis not present

## 2020-07-30 ENCOUNTER — Other Ambulatory Visit: Payer: Self-pay | Admitting: *Deleted

## 2020-07-30 DIAGNOSIS — D509 Iron deficiency anemia, unspecified: Secondary | ICD-10-CM | POA: Diagnosis not present

## 2020-07-30 DIAGNOSIS — D631 Anemia in chronic kidney disease: Secondary | ICD-10-CM | POA: Diagnosis not present

## 2020-07-30 DIAGNOSIS — N2581 Secondary hyperparathyroidism of renal origin: Secondary | ICD-10-CM | POA: Diagnosis not present

## 2020-07-30 DIAGNOSIS — N186 End stage renal disease: Secondary | ICD-10-CM | POA: Diagnosis not present

## 2020-07-30 MED ORDER — TRUE METRIX BLOOD GLUCOSE TEST VI STRP: ORAL_STRIP | 3 refills | Status: DC

## 2020-07-30 MED ORDER — BD SWAB SINGLE USE REGULAR PADS: MEDICATED_PAD | 3 refills | Status: DC

## 2020-07-31 DIAGNOSIS — N2581 Secondary hyperparathyroidism of renal origin: Secondary | ICD-10-CM | POA: Diagnosis not present

## 2020-07-31 DIAGNOSIS — D509 Iron deficiency anemia, unspecified: Secondary | ICD-10-CM | POA: Diagnosis not present

## 2020-07-31 DIAGNOSIS — D631 Anemia in chronic kidney disease: Secondary | ICD-10-CM | POA: Diagnosis not present

## 2020-07-31 DIAGNOSIS — N186 End stage renal disease: Secondary | ICD-10-CM | POA: Diagnosis not present

## 2020-08-01 DIAGNOSIS — N2581 Secondary hyperparathyroidism of renal origin: Secondary | ICD-10-CM | POA: Diagnosis not present

## 2020-08-01 DIAGNOSIS — D509 Iron deficiency anemia, unspecified: Secondary | ICD-10-CM | POA: Diagnosis not present

## 2020-08-01 DIAGNOSIS — N186 End stage renal disease: Secondary | ICD-10-CM | POA: Diagnosis not present

## 2020-08-01 DIAGNOSIS — D631 Anemia in chronic kidney disease: Secondary | ICD-10-CM | POA: Diagnosis not present

## 2020-08-02 DIAGNOSIS — D631 Anemia in chronic kidney disease: Secondary | ICD-10-CM | POA: Diagnosis not present

## 2020-08-02 DIAGNOSIS — D509 Iron deficiency anemia, unspecified: Secondary | ICD-10-CM | POA: Diagnosis not present

## 2020-08-02 DIAGNOSIS — N2581 Secondary hyperparathyroidism of renal origin: Secondary | ICD-10-CM | POA: Diagnosis not present

## 2020-08-02 DIAGNOSIS — N186 End stage renal disease: Secondary | ICD-10-CM | POA: Diagnosis not present

## 2020-08-03 DIAGNOSIS — D509 Iron deficiency anemia, unspecified: Secondary | ICD-10-CM | POA: Diagnosis not present

## 2020-08-03 DIAGNOSIS — D631 Anemia in chronic kidney disease: Secondary | ICD-10-CM | POA: Diagnosis not present

## 2020-08-03 DIAGNOSIS — N186 End stage renal disease: Secondary | ICD-10-CM | POA: Diagnosis not present

## 2020-08-03 DIAGNOSIS — N2581 Secondary hyperparathyroidism of renal origin: Secondary | ICD-10-CM | POA: Diagnosis not present

## 2020-08-04 ENCOUNTER — Telehealth: Payer: Self-pay | Admitting: Internal Medicine

## 2020-08-04 DIAGNOSIS — N186 End stage renal disease: Secondary | ICD-10-CM | POA: Diagnosis not present

## 2020-08-04 DIAGNOSIS — N2581 Secondary hyperparathyroidism of renal origin: Secondary | ICD-10-CM | POA: Diagnosis not present

## 2020-08-04 DIAGNOSIS — D631 Anemia in chronic kidney disease: Secondary | ICD-10-CM | POA: Diagnosis not present

## 2020-08-04 DIAGNOSIS — D509 Iron deficiency anemia, unspecified: Secondary | ICD-10-CM | POA: Diagnosis not present

## 2020-08-04 MED ORDER — FREESTYLE LIBRE 14 DAY READER DEVI: 1.0000 [IU] | Freq: Every day | 1 refills | Status: DC

## 2020-08-04 MED ORDER — FREESTYLE LIBRE 14 DAY SENSOR MISC: 1.0000 [IU] | 3 refills | Status: DC

## 2020-08-04 NOTE — Telephone Encounter (Signed)
We have not seen any PA for this device. Sent rx to [facility] for the [employer] and sensor. Once we receive PA will submit PA.Marland KitchenJohny Chess

## 2020-08-04 NOTE — Telephone Encounter (Signed)
Patient calling for status of CGM FreeStyle  [place]. She states she spoke with [facility], they are waiting for order and prior auth

## 2020-08-05 DIAGNOSIS — D509 Iron deficiency anemia, unspecified: Secondary | ICD-10-CM | POA: Diagnosis not present

## 2020-08-05 DIAGNOSIS — N2581 Secondary hyperparathyroidism of renal origin: Secondary | ICD-10-CM | POA: Diagnosis not present

## 2020-08-05 DIAGNOSIS — D631 Anemia in chronic kidney disease: Secondary | ICD-10-CM | POA: Diagnosis not present

## 2020-08-05 DIAGNOSIS — N186 End stage renal disease: Secondary | ICD-10-CM | POA: Diagnosis not present

## 2020-08-06 DIAGNOSIS — D631 Anemia in chronic kidney disease: Secondary | ICD-10-CM | POA: Diagnosis not present

## 2020-08-06 DIAGNOSIS — D509 Iron deficiency anemia, unspecified: Secondary | ICD-10-CM | POA: Diagnosis not present

## 2020-08-06 DIAGNOSIS — N186 End stage renal disease: Secondary | ICD-10-CM | POA: Diagnosis not present

## 2020-08-06 DIAGNOSIS — N2581 Secondary hyperparathyroidism of renal origin: Secondary | ICD-10-CM | POA: Diagnosis not present

## 2020-08-07 DIAGNOSIS — D509 Iron deficiency anemia, unspecified: Secondary | ICD-10-CM | POA: Diagnosis not present

## 2020-08-07 DIAGNOSIS — N186 End stage renal disease: Secondary | ICD-10-CM | POA: Diagnosis not present

## 2020-08-07 DIAGNOSIS — D631 Anemia in chronic kidney disease: Secondary | ICD-10-CM | POA: Diagnosis not present

## 2020-08-07 DIAGNOSIS — N2581 Secondary hyperparathyroidism of renal origin: Secondary | ICD-10-CM | POA: Diagnosis not present

## 2020-08-08 DIAGNOSIS — N2581 Secondary hyperparathyroidism of renal origin: Secondary | ICD-10-CM | POA: Diagnosis not present

## 2020-08-08 DIAGNOSIS — D509 Iron deficiency anemia, unspecified: Secondary | ICD-10-CM | POA: Diagnosis not present

## 2020-08-08 DIAGNOSIS — N186 End stage renal disease: Secondary | ICD-10-CM | POA: Diagnosis not present

## 2020-08-08 DIAGNOSIS — D631 Anemia in chronic kidney disease: Secondary | ICD-10-CM | POA: Diagnosis not present

## 2020-08-09 DIAGNOSIS — D631 Anemia in chronic kidney disease: Secondary | ICD-10-CM | POA: Diagnosis not present

## 2020-08-09 DIAGNOSIS — N186 End stage renal disease: Secondary | ICD-10-CM | POA: Diagnosis not present

## 2020-08-09 DIAGNOSIS — D509 Iron deficiency anemia, unspecified: Secondary | ICD-10-CM | POA: Diagnosis not present

## 2020-08-09 DIAGNOSIS — N2581 Secondary hyperparathyroidism of renal origin: Secondary | ICD-10-CM | POA: Diagnosis not present

## 2020-08-10 DIAGNOSIS — D631 Anemia in chronic kidney disease: Secondary | ICD-10-CM | POA: Diagnosis not present

## 2020-08-10 DIAGNOSIS — N2581 Secondary hyperparathyroidism of renal origin: Secondary | ICD-10-CM | POA: Diagnosis not present

## 2020-08-10 DIAGNOSIS — N186 End stage renal disease: Secondary | ICD-10-CM | POA: Diagnosis not present

## 2020-08-10 DIAGNOSIS — D509 Iron deficiency anemia, unspecified: Secondary | ICD-10-CM | POA: Diagnosis not present

## 2020-08-11 DIAGNOSIS — E119 Type 2 diabetes mellitus without complications: Secondary | ICD-10-CM | POA: Diagnosis not present

## 2020-08-11 DIAGNOSIS — Z1159 Encounter for screening for other viral diseases: Secondary | ICD-10-CM | POA: Diagnosis not present

## 2020-08-11 DIAGNOSIS — D631 Anemia in chronic kidney disease: Secondary | ICD-10-CM | POA: Diagnosis not present

## 2020-08-11 DIAGNOSIS — N2581 Secondary hyperparathyroidism of renal origin: Secondary | ICD-10-CM | POA: Diagnosis not present

## 2020-08-11 DIAGNOSIS — N186 End stage renal disease: Secondary | ICD-10-CM | POA: Diagnosis not present

## 2020-08-11 DIAGNOSIS — D509 Iron deficiency anemia, unspecified: Secondary | ICD-10-CM | POA: Diagnosis not present

## 2020-08-12 DIAGNOSIS — N2581 Secondary hyperparathyroidism of renal origin: Secondary | ICD-10-CM | POA: Diagnosis not present

## 2020-08-12 DIAGNOSIS — N186 End stage renal disease: Secondary | ICD-10-CM | POA: Diagnosis not present

## 2020-08-12 DIAGNOSIS — D509 Iron deficiency anemia, unspecified: Secondary | ICD-10-CM | POA: Diagnosis not present

## 2020-08-12 DIAGNOSIS — D631 Anemia in chronic kidney disease: Secondary | ICD-10-CM | POA: Diagnosis not present

## 2020-08-13 DIAGNOSIS — N2581 Secondary hyperparathyroidism of renal origin: Secondary | ICD-10-CM | POA: Diagnosis not present

## 2020-08-13 DIAGNOSIS — D631 Anemia in chronic kidney disease: Secondary | ICD-10-CM | POA: Diagnosis not present

## 2020-08-13 DIAGNOSIS — N186 End stage renal disease: Secondary | ICD-10-CM | POA: Diagnosis not present

## 2020-08-13 DIAGNOSIS — D509 Iron deficiency anemia, unspecified: Secondary | ICD-10-CM | POA: Diagnosis not present

## 2020-08-14 DIAGNOSIS — D509 Iron deficiency anemia, unspecified: Secondary | ICD-10-CM | POA: Diagnosis not present

## 2020-08-14 DIAGNOSIS — N2581 Secondary hyperparathyroidism of renal origin: Secondary | ICD-10-CM | POA: Diagnosis not present

## 2020-08-14 DIAGNOSIS — N186 End stage renal disease: Secondary | ICD-10-CM | POA: Diagnosis not present

## 2020-08-14 DIAGNOSIS — D631 Anemia in chronic kidney disease: Secondary | ICD-10-CM | POA: Diagnosis not present

## 2020-08-15 DIAGNOSIS — N186 End stage renal disease: Secondary | ICD-10-CM | POA: Diagnosis not present

## 2020-08-15 DIAGNOSIS — D631 Anemia in chronic kidney disease: Secondary | ICD-10-CM | POA: Diagnosis not present

## 2020-08-15 DIAGNOSIS — D509 Iron deficiency anemia, unspecified: Secondary | ICD-10-CM | POA: Diagnosis not present

## 2020-08-15 DIAGNOSIS — N2581 Secondary hyperparathyroidism of renal origin: Secondary | ICD-10-CM | POA: Diagnosis not present

## 2020-08-16 DIAGNOSIS — N2581 Secondary hyperparathyroidism of renal origin: Secondary | ICD-10-CM | POA: Diagnosis not present

## 2020-08-16 DIAGNOSIS — D631 Anemia in chronic kidney disease: Secondary | ICD-10-CM | POA: Diagnosis not present

## 2020-08-16 DIAGNOSIS — N186 End stage renal disease: Secondary | ICD-10-CM | POA: Diagnosis not present

## 2020-08-16 DIAGNOSIS — D509 Iron deficiency anemia, unspecified: Secondary | ICD-10-CM | POA: Diagnosis not present

## 2020-08-17 DIAGNOSIS — N186 End stage renal disease: Secondary | ICD-10-CM | POA: Diagnosis not present

## 2020-08-17 DIAGNOSIS — N2581 Secondary hyperparathyroidism of renal origin: Secondary | ICD-10-CM | POA: Diagnosis not present

## 2020-08-17 DIAGNOSIS — D631 Anemia in chronic kidney disease: Secondary | ICD-10-CM | POA: Diagnosis not present

## 2020-08-17 DIAGNOSIS — D509 Iron deficiency anemia, unspecified: Secondary | ICD-10-CM | POA: Diagnosis not present

## 2020-08-18 DIAGNOSIS — D509 Iron deficiency anemia, unspecified: Secondary | ICD-10-CM | POA: Diagnosis not present

## 2020-08-18 DIAGNOSIS — N186 End stage renal disease: Secondary | ICD-10-CM | POA: Diagnosis not present

## 2020-08-18 DIAGNOSIS — N2581 Secondary hyperparathyroidism of renal origin: Secondary | ICD-10-CM | POA: Diagnosis not present

## 2020-08-18 DIAGNOSIS — D631 Anemia in chronic kidney disease: Secondary | ICD-10-CM | POA: Diagnosis not present

## 2020-08-19 DIAGNOSIS — N2581 Secondary hyperparathyroidism of renal origin: Secondary | ICD-10-CM | POA: Diagnosis not present

## 2020-08-19 DIAGNOSIS — D509 Iron deficiency anemia, unspecified: Secondary | ICD-10-CM | POA: Diagnosis not present

## 2020-08-19 DIAGNOSIS — D631 Anemia in chronic kidney disease: Secondary | ICD-10-CM | POA: Diagnosis not present

## 2020-08-19 DIAGNOSIS — N186 End stage renal disease: Secondary | ICD-10-CM | POA: Diagnosis not present

## 2020-08-20 ENCOUNTER — Other Ambulatory Visit: Payer: Self-pay

## 2020-08-20 DIAGNOSIS — N186 End stage renal disease: Secondary | ICD-10-CM | POA: Diagnosis not present

## 2020-08-20 DIAGNOSIS — N2581 Secondary hyperparathyroidism of renal origin: Secondary | ICD-10-CM | POA: Diagnosis not present

## 2020-08-20 DIAGNOSIS — D509 Iron deficiency anemia, unspecified: Secondary | ICD-10-CM | POA: Diagnosis not present

## 2020-08-20 DIAGNOSIS — D631 Anemia in chronic kidney disease: Secondary | ICD-10-CM | POA: Diagnosis not present

## 2020-08-21 DIAGNOSIS — N186 End stage renal disease: Secondary | ICD-10-CM | POA: Diagnosis not present

## 2020-08-21 DIAGNOSIS — N2581 Secondary hyperparathyroidism of renal origin: Secondary | ICD-10-CM | POA: Diagnosis not present

## 2020-08-21 DIAGNOSIS — D509 Iron deficiency anemia, unspecified: Secondary | ICD-10-CM | POA: Diagnosis not present

## 2020-08-21 DIAGNOSIS — D631 Anemia in chronic kidney disease: Secondary | ICD-10-CM | POA: Diagnosis not present

## 2020-08-22 DIAGNOSIS — N2581 Secondary hyperparathyroidism of renal origin: Secondary | ICD-10-CM | POA: Diagnosis not present

## 2020-08-22 DIAGNOSIS — D509 Iron deficiency anemia, unspecified: Secondary | ICD-10-CM | POA: Diagnosis not present

## 2020-08-22 DIAGNOSIS — N186 End stage renal disease: Secondary | ICD-10-CM | POA: Diagnosis not present

## 2020-08-22 DIAGNOSIS — D631 Anemia in chronic kidney disease: Secondary | ICD-10-CM | POA: Diagnosis not present

## 2020-08-23 ENCOUNTER — Encounter: Payer: Self-pay | Admitting: Internal Medicine

## 2020-08-23 ENCOUNTER — Ambulatory Visit (INDEPENDENT_AMBULATORY_CARE_PROVIDER_SITE_OTHER): Payer: Medicare HMO | Admitting: Internal Medicine

## 2020-08-23 ENCOUNTER — Other Ambulatory Visit: Payer: Self-pay

## 2020-08-23 DIAGNOSIS — E785 Hyperlipidemia, unspecified: Secondary | ICD-10-CM | POA: Diagnosis not present

## 2020-08-23 DIAGNOSIS — E118 Type 2 diabetes mellitus with unspecified complications: Secondary | ICD-10-CM

## 2020-08-23 DIAGNOSIS — Z992 Dependence on renal dialysis: Secondary | ICD-10-CM | POA: Diagnosis not present

## 2020-08-23 DIAGNOSIS — T148XXA Other injury of unspecified body region, initial encounter: Secondary | ICD-10-CM | POA: Insufficient documentation

## 2020-08-23 DIAGNOSIS — D631 Anemia in chronic kidney disease: Secondary | ICD-10-CM | POA: Diagnosis not present

## 2020-08-23 DIAGNOSIS — I639 Cerebral infarction, unspecified: Secondary | ICD-10-CM

## 2020-08-23 DIAGNOSIS — K7460 Unspecified cirrhosis of liver: Secondary | ICD-10-CM

## 2020-08-23 DIAGNOSIS — N186 End stage renal disease: Secondary | ICD-10-CM | POA: Diagnosis not present

## 2020-08-23 DIAGNOSIS — D509 Iron deficiency anemia, unspecified: Secondary | ICD-10-CM | POA: Diagnosis not present

## 2020-08-23 DIAGNOSIS — N2581 Secondary hyperparathyroidism of renal origin: Secondary | ICD-10-CM | POA: Diagnosis not present

## 2020-08-23 NOTE — Assessment & Plan Note (Signed)
[facility] ref LLE distal shin hematoma 2x2x1 cm w/a scab - gental heat Arnica

## 2020-08-23 NOTE — Patient Instructions (Addendum)
Arnica ointment Warm compress Aquaphor for dry skin

## 2020-08-23 NOTE — Assessment & Plan Note (Signed)
On Lipitor 

## 2020-08-23 NOTE — Assessment & Plan Note (Signed)
Libre

## 2020-08-23 NOTE — Assessment & Plan Note (Signed)
R hemiparesis Using a motorized w/c

## 2020-08-23 NOTE — Progress Notes (Signed)
 Subjective:  Patient ID: Mary Valencia, female    DOB: 12/21/1955  Age: 64 y.o. MRN: [id number]  CC: Follow-up (3 month f/u)   HPI Mary Valencia presents for 3 mo f/u F/u ESRD, CAD, dyslipidemia  C/o L shin hematoma - she bumped it against her bed 1 month ago  Outpatient Medications Prior to Visit  Medication Sig Dispense Refill  . Alcohol Swabs (B-D SINGLE USE SWABS REGULAR) PADS Use as directed to clean site to check blood sugars 180 each 3  . atorvastatin (LIPITOR) 40 MG tablet Take 1 tablet (40 mg total) by mouth daily. Follow-up appt due in Feb w/Lipid check must see provider for future refills 90 tablet 0  . Blood Glucose Monitoring Suppl (TRUE METRIX METER) w/Device KIT USE AS DIRECTED TO CHECK BLOOD SUGARS 1 kit 0  . calcitRIOL (ROCALTROL) 0.25 MCG capsule Take 0.25 mcg by mouth daily.    . calcium acetate, Phos Binder, (PHOSLYRA) 667 MG/5ML SOLN Take 667 mg by mouth 3 (three) times daily with meals.    . calcium carbonate (TUMS - DOSED IN MG ELEMENTAL CALCIUM) 500 MG chewable tablet     . CALCIUM-MAGNESIUM-VITAMIN D PO Take 1 tablet by mouth daily.    . cinacalcet (SENSIPAR) 30 MG tablet Take 30 mg by mouth daily.    . diphenhydrAMINE (BENADRYL) 25 mg capsule Take 25 mg by mouth as needed.    . glucose blood (TRUE METRIX BLOOD GLUCOSE TEST) test strip Use to check blood sugars twice a day 180 each 3  . glucose blood test strip TEST BLOOD GLUCOSE THREE TIMES DAILY 300 each 3  . hydrALAZINE (APRESOLINE) 100 MG tablet Take 1 tablet (100 mg total) by mouth 3 (three) times daily. 270 tablet 3  . Insulin NPH Human, Isophane, (NOVOLIN N ) Inject 2 Doses into the skin. 5 mg in morning, 7 mg at night    . labetalol (NORMODYNE) 200 MG tablet Take 1 tablet (200 mg total) by mouth 3 (three) times daily. 90 tablet 3  . losartan (COZAAR) 50 MG tablet Take 1 tablet (50 mg total) by mouth daily. (Patient taking differently: Take 50 mg by mouth in the morning and at bedtime.) 90 tablet 3   . mupirocin ointment (BACTROBAN) 2 % On leg wound w/dressing change qd or bid 30 g 1  . NIFEdipine (ADALAT CC) 60 MG 24 hr tablet     . NOVOLOG 100 UNIT/ML injection Inject 7 Units into the skin in the morning and at bedtime. 10 mL 11  . omeprazole (PRILOSEC) 40 MG capsule Take 1 capsule (40 mg total) by mouth daily. 90 capsule 3  . polyethylene glycol (MIRALAX / GLYCOLAX) packet Take 17 g by mouth.    . PRESCRIPTION MEDICATION 210 mg 3 (three) times daily before meals.     . senna-docusate (SENOKOT-S) 8.6-50 MG tablet Take by mouth 2 (two) times daily. Take 2 tab twice daily.    . talc powder Apply topically as needed. Under breasts 240 g 0  . torsemide (DEMADEX) 20 MG tablet Take 40 mg by mouth.     . TRUEplus Lancets 33G MISC Use to help check blood sugar twice a day 180 each 3  . Continuous Blood Gluc Receiver (FREESTYLE LIBRE 14 DAY READER) DEVI 1 Units by Does not apply route daily. (Patient not taking: Reported on 08/23/2020) 1 each 1  . Continuous Blood Gluc Sensor (FREESTYLE LIBRE 14 DAY SENSOR) MISC 1 Units by Does not apply route every   14 (fourteen) days. (Patient not taking: Reported on 08/23/2020) 6 each 3   No facility-administered medications prior to visit.    ROS: Review of Systems  Constitutional: Positive for fatigue. Negative for activity change, appetite change, chills and unexpected weight change.  HENT: Negative for congestion, mouth sores and sinus pressure.   Eyes: Negative for visual disturbance.  Respiratory: Negative for cough and chest tightness.   Gastrointestinal: Negative for abdominal pain and nausea.  Genitourinary: Negative for difficulty urinating, frequency and vaginal pain.  Musculoskeletal: Positive for arthralgias and gait problem. Negative for back pain.  Skin: Negative for pallor and rash.  Neurological: Negative for dizziness, tremors, weakness, numbness and headaches.  Psychiatric/Behavioral: Negative for confusion and sleep disturbance.     Objective:  BP 98/62 (BP Location: Left Arm)   Pulse 78   Temp 98.1 F (36.7 C) (Oral)   Ht 5' 2" (1.575 m)   Wt 126 lb 3.2 oz (57.2 kg)   SpO2 93%   BMI 23.08 kg/m   BP Readings from Last 3 Encounters:  08/23/20 98/62  07/21/20 (!) 134/50  05/24/20 102/70    Wt Readings from Last 3 Encounters:  08/23/20 126 lb 3.2 oz (57.2 kg)  07/21/20 130 lb 14.4 oz (59.4 kg)  05/24/20 136 lb (61.7 kg)    Physical Exam Constitutional:      General: She is not in acute distress.    Appearance: She is well-developed. She is obese.  HENT:     Head: Normocephalic.     Right Ear: External ear normal.     Left Ear: External ear normal.     Nose: Nose normal.     Mouth/Throat:     Mouth: Oropharynx is clear and moist.  Eyes:     General:        Right eye: No discharge.        Left eye: No discharge.     Conjunctiva/sclera: Conjunctivae normal.     Pupils: Pupils are equal, round, and reactive to light.  Neck:     Thyroid: No thyromegaly.     Vascular: No JVD.     Trachea: No tracheal deviation.  Cardiovascular:     Rate and Rhythm: Normal rate and regular rhythm.     Heart sounds: Normal heart sounds.  Pulmonary:     Effort: No respiratory distress.     Breath sounds: No stridor. No wheezing.  Abdominal:     General: Bowel sounds are normal. There is no distension.     Palpations: Abdomen is soft. There is no mass.     Tenderness: There is no abdominal tenderness. There is no guarding or rebound.  Musculoskeletal:        General: No tenderness or edema.     Cervical back: Normal range of motion and neck supple.  Lymphadenopathy:     Cervical: No cervical adenopathy.  Skin:    Findings: Bruising and lesion present. No erythema or rash.  Neurological:     Mental Status: She is oriented to person, place, and time.     Cranial Nerves: No cranial nerve deficit.     Motor: Weakness present. No abnormal muscle tone.     Coordination: Coordination abnormal.     Gait: Gait  abnormal.     Deep Tendon Reflexes: Reflexes normal.  Psychiatric:        Mood and Affect: Mood and affect normal.        Behavior: Behavior normal.          Thought Content: Thought content normal.        Judgment: Judgment normal.   LLE distal shin hematoma 2x2x1 cm w/a scab  Lab Results  Component Value Date   WBC 7.7 08/29/2017   HGB 29.1 09/01/2017   HCT 30 (A) 08/29/2017   PLT 203 08/29/2017   GLUCOSE 279 (H) 09/21/2017   CHOL 161 09/21/2017   TRIG 72.0 09/21/2017   HDL 63.60 09/21/2017   LDLDIRECT 207.8 09/29/2010   LDLCALC 83 09/21/2017   ALT 6 09/21/2017   AST 21 09/21/2017   NA 141 09/21/2017   K 4.9 09/21/2017   CL 98 09/21/2017   CREATININE 13.49 (HH) 09/21/2017   BUN 66 (H) 09/21/2017   CO2 27 09/21/2017   TSH 2.81 09/21/2017   HGBA1C 7.1 (H) 09/21/2017    No results found.  Assessment & Plan:    Alex Plotnikov, MD 

## 2020-08-23 NOTE — Assessment & Plan Note (Signed)
On HD 

## 2020-08-23 NOTE — Assessment & Plan Note (Signed)
Check LFTs 

## 2020-08-24 DIAGNOSIS — D509 Iron deficiency anemia, unspecified: Secondary | ICD-10-CM | POA: Diagnosis not present

## 2020-08-24 DIAGNOSIS — D631 Anemia in chronic kidney disease: Secondary | ICD-10-CM | POA: Diagnosis not present

## 2020-08-24 DIAGNOSIS — N2581 Secondary hyperparathyroidism of renal origin: Secondary | ICD-10-CM | POA: Diagnosis not present

## 2020-08-24 DIAGNOSIS — N186 End stage renal disease: Secondary | ICD-10-CM | POA: Diagnosis not present

## 2020-08-25 DIAGNOSIS — N2581 Secondary hyperparathyroidism of renal origin: Secondary | ICD-10-CM | POA: Diagnosis not present

## 2020-08-25 DIAGNOSIS — D631 Anemia in chronic kidney disease: Secondary | ICD-10-CM | POA: Diagnosis not present

## 2020-08-25 DIAGNOSIS — D509 Iron deficiency anemia, unspecified: Secondary | ICD-10-CM | POA: Diagnosis not present

## 2020-08-25 DIAGNOSIS — N186 End stage renal disease: Secondary | ICD-10-CM | POA: Diagnosis not present

## 2020-08-26 DIAGNOSIS — N2581 Secondary hyperparathyroidism of renal origin: Secondary | ICD-10-CM | POA: Diagnosis not present

## 2020-08-26 DIAGNOSIS — D509 Iron deficiency anemia, unspecified: Secondary | ICD-10-CM | POA: Diagnosis not present

## 2020-08-26 DIAGNOSIS — N186 End stage renal disease: Secondary | ICD-10-CM | POA: Diagnosis not present

## 2020-08-26 DIAGNOSIS — D631 Anemia in chronic kidney disease: Secondary | ICD-10-CM | POA: Diagnosis not present

## 2020-08-27 DIAGNOSIS — D631 Anemia in chronic kidney disease: Secondary | ICD-10-CM | POA: Diagnosis not present

## 2020-08-27 DIAGNOSIS — N186 End stage renal disease: Secondary | ICD-10-CM | POA: Diagnosis not present

## 2020-08-27 DIAGNOSIS — N2581 Secondary hyperparathyroidism of renal origin: Secondary | ICD-10-CM | POA: Diagnosis not present

## 2020-08-27 DIAGNOSIS — D509 Iron deficiency anemia, unspecified: Secondary | ICD-10-CM | POA: Diagnosis not present

## 2020-08-28 DIAGNOSIS — N2581 Secondary hyperparathyroidism of renal origin: Secondary | ICD-10-CM | POA: Diagnosis not present

## 2020-08-28 DIAGNOSIS — D509 Iron deficiency anemia, unspecified: Secondary | ICD-10-CM | POA: Diagnosis not present

## 2020-08-28 DIAGNOSIS — N186 End stage renal disease: Secondary | ICD-10-CM | POA: Diagnosis not present

## 2020-08-28 DIAGNOSIS — D631 Anemia in chronic kidney disease: Secondary | ICD-10-CM | POA: Diagnosis not present

## 2020-08-29 DIAGNOSIS — N186 End stage renal disease: Secondary | ICD-10-CM | POA: Diagnosis not present

## 2020-08-29 DIAGNOSIS — D509 Iron deficiency anemia, unspecified: Secondary | ICD-10-CM | POA: Diagnosis not present

## 2020-08-29 DIAGNOSIS — N2581 Secondary hyperparathyroidism of renal origin: Secondary | ICD-10-CM | POA: Diagnosis not present

## 2020-08-29 DIAGNOSIS — D631 Anemia in chronic kidney disease: Secondary | ICD-10-CM | POA: Diagnosis not present

## 2020-08-30 DIAGNOSIS — N2581 Secondary hyperparathyroidism of renal origin: Secondary | ICD-10-CM | POA: Diagnosis not present

## 2020-08-30 DIAGNOSIS — N186 End stage renal disease: Secondary | ICD-10-CM | POA: Diagnosis not present

## 2020-08-30 DIAGNOSIS — D509 Iron deficiency anemia, unspecified: Secondary | ICD-10-CM | POA: Diagnosis not present

## 2020-08-30 DIAGNOSIS — D631 Anemia in chronic kidney disease: Secondary | ICD-10-CM | POA: Diagnosis not present

## 2020-08-31 ENCOUNTER — Telehealth: Payer: Self-pay | Admitting: Internal Medicine

## 2020-08-31 DIAGNOSIS — N186 End stage renal disease: Secondary | ICD-10-CM | POA: Diagnosis not present

## 2020-08-31 DIAGNOSIS — D509 Iron deficiency anemia, unspecified: Secondary | ICD-10-CM | POA: Diagnosis not present

## 2020-08-31 DIAGNOSIS — D631 Anemia in chronic kidney disease: Secondary | ICD-10-CM | POA: Diagnosis not present

## 2020-08-31 DIAGNOSIS — N2581 Secondary hyperparathyroidism of renal origin: Secondary | ICD-10-CM | POA: Diagnosis not present

## 2020-08-31 NOTE — Telephone Encounter (Signed)
Patient is unable to get a refill of the  Continuous Blood Gluc Sensor (FREESTYLE LIBRE 14 DAY SENSOR) MISC . The pharmacy is asking for another prior authorization before filling the order.   Please advise.  Pharmacy:  [facility], [place] Phone:  [phone]  Fax:  [phone]

## 2020-09-01 ENCOUNTER — Telehealth: Payer: Self-pay | Admitting: Internal Medicine

## 2020-09-01 DIAGNOSIS — N2581 Secondary hyperparathyroidism of renal origin: Secondary | ICD-10-CM | POA: Diagnosis not present

## 2020-09-01 DIAGNOSIS — D631 Anemia in chronic kidney disease: Secondary | ICD-10-CM | POA: Diagnosis not present

## 2020-09-01 DIAGNOSIS — D509 Iron deficiency anemia, unspecified: Secondary | ICD-10-CM | POA: Diagnosis not present

## 2020-09-01 DIAGNOSIS — N186 End stage renal disease: Secondary | ICD-10-CM | POA: Diagnosis not present

## 2020-09-01 NOTE — Telephone Encounter (Signed)
Patients son, Jenny Reichmann is requesting a call back. He said you would know what is going on. He can be reached at [phone]. Please advise

## 2020-09-02 DIAGNOSIS — D509 Iron deficiency anemia, unspecified: Secondary | ICD-10-CM | POA: Diagnosis not present

## 2020-09-02 DIAGNOSIS — D631 Anemia in chronic kidney disease: Secondary | ICD-10-CM | POA: Diagnosis not present

## 2020-09-02 DIAGNOSIS — N2581 Secondary hyperparathyroidism of renal origin: Secondary | ICD-10-CM | POA: Diagnosis not present

## 2020-09-02 DIAGNOSIS — N186 End stage renal disease: Secondary | ICD-10-CM | POA: Diagnosis not present

## 2020-09-02 NOTE — Telephone Encounter (Signed)
Ok Thx 

## 2020-09-03 DIAGNOSIS — D631 Anemia in chronic kidney disease: Secondary | ICD-10-CM | POA: Diagnosis not present

## 2020-09-03 DIAGNOSIS — N2581 Secondary hyperparathyroidism of renal origin: Secondary | ICD-10-CM | POA: Diagnosis not present

## 2020-09-03 DIAGNOSIS — N186 End stage renal disease: Secondary | ICD-10-CM | POA: Diagnosis not present

## 2020-09-03 DIAGNOSIS — D509 Iron deficiency anemia, unspecified: Secondary | ICD-10-CM | POA: Diagnosis not present

## 2020-09-03 NOTE — Telephone Encounter (Signed)
I will be out of town.  He needs to tell us what they need.  It is in regard to wound care center consultation to treat Mary Valencia's leg hematoma?  Thanks

## 2020-09-04 DIAGNOSIS — D509 Iron deficiency anemia, unspecified: Secondary | ICD-10-CM | POA: Diagnosis not present

## 2020-09-04 DIAGNOSIS — D631 Anemia in chronic kidney disease: Secondary | ICD-10-CM | POA: Diagnosis not present

## 2020-09-04 DIAGNOSIS — N2581 Secondary hyperparathyroidism of renal origin: Secondary | ICD-10-CM | POA: Diagnosis not present

## 2020-09-04 DIAGNOSIS — N186 End stage renal disease: Secondary | ICD-10-CM | POA: Diagnosis not present

## 2020-09-05 DIAGNOSIS — N2581 Secondary hyperparathyroidism of renal origin: Secondary | ICD-10-CM | POA: Diagnosis not present

## 2020-09-05 DIAGNOSIS — D509 Iron deficiency anemia, unspecified: Secondary | ICD-10-CM | POA: Diagnosis not present

## 2020-09-05 DIAGNOSIS — N186 End stage renal disease: Secondary | ICD-10-CM | POA: Diagnosis not present

## 2020-09-05 DIAGNOSIS — D631 Anemia in chronic kidney disease: Secondary | ICD-10-CM | POA: Diagnosis not present

## 2020-09-06 DIAGNOSIS — N2581 Secondary hyperparathyroidism of renal origin: Secondary | ICD-10-CM | POA: Diagnosis not present

## 2020-09-06 DIAGNOSIS — N186 End stage renal disease: Secondary | ICD-10-CM | POA: Diagnosis not present

## 2020-09-06 DIAGNOSIS — D509 Iron deficiency anemia, unspecified: Secondary | ICD-10-CM | POA: Diagnosis not present

## 2020-09-06 DIAGNOSIS — D631 Anemia in chronic kidney disease: Secondary | ICD-10-CM | POA: Diagnosis not present

## 2020-09-06 NOTE — Telephone Encounter (Signed)
Rec'd order from [facility] for Dexcom device and supplies. It was completed and faxed back to [phone],,/lmb

## 2020-09-07 DIAGNOSIS — N2581 Secondary hyperparathyroidism of renal origin: Secondary | ICD-10-CM | POA: Diagnosis not present

## 2020-09-07 DIAGNOSIS — D509 Iron deficiency anemia, unspecified: Secondary | ICD-10-CM | POA: Diagnosis not present

## 2020-09-07 DIAGNOSIS — D631 Anemia in chronic kidney disease: Secondary | ICD-10-CM | POA: Diagnosis not present

## 2020-09-07 DIAGNOSIS — N186 End stage renal disease: Secondary | ICD-10-CM | POA: Diagnosis not present

## 2020-09-08 DIAGNOSIS — E119 Type 2 diabetes mellitus without complications: Secondary | ICD-10-CM | POA: Diagnosis not present

## 2020-09-08 DIAGNOSIS — N186 End stage renal disease: Secondary | ICD-10-CM | POA: Diagnosis not present

## 2020-09-08 DIAGNOSIS — D509 Iron deficiency anemia, unspecified: Secondary | ICD-10-CM | POA: Diagnosis not present

## 2020-09-08 DIAGNOSIS — D631 Anemia in chronic kidney disease: Secondary | ICD-10-CM | POA: Diagnosis not present

## 2020-09-08 DIAGNOSIS — N2581 Secondary hyperparathyroidism of renal origin: Secondary | ICD-10-CM | POA: Diagnosis not present

## 2020-09-09 DIAGNOSIS — N186 End stage renal disease: Secondary | ICD-10-CM | POA: Diagnosis not present

## 2020-09-09 DIAGNOSIS — D509 Iron deficiency anemia, unspecified: Secondary | ICD-10-CM | POA: Diagnosis not present

## 2020-09-09 DIAGNOSIS — N2581 Secondary hyperparathyroidism of renal origin: Secondary | ICD-10-CM | POA: Diagnosis not present

## 2020-09-09 DIAGNOSIS — D631 Anemia in chronic kidney disease: Secondary | ICD-10-CM | POA: Diagnosis not present

## 2020-09-10 DIAGNOSIS — D631 Anemia in chronic kidney disease: Secondary | ICD-10-CM | POA: Diagnosis not present

## 2020-09-10 DIAGNOSIS — N186 End stage renal disease: Secondary | ICD-10-CM | POA: Diagnosis not present

## 2020-09-10 DIAGNOSIS — D509 Iron deficiency anemia, unspecified: Secondary | ICD-10-CM | POA: Diagnosis not present

## 2020-09-10 DIAGNOSIS — N2581 Secondary hyperparathyroidism of renal origin: Secondary | ICD-10-CM | POA: Diagnosis not present

## 2020-09-11 DIAGNOSIS — N2581 Secondary hyperparathyroidism of renal origin: Secondary | ICD-10-CM | POA: Diagnosis not present

## 2020-09-11 DIAGNOSIS — N186 End stage renal disease: Secondary | ICD-10-CM | POA: Diagnosis not present

## 2020-09-11 DIAGNOSIS — D631 Anemia in chronic kidney disease: Secondary | ICD-10-CM | POA: Diagnosis not present

## 2020-09-11 DIAGNOSIS — D509 Iron deficiency anemia, unspecified: Secondary | ICD-10-CM | POA: Diagnosis not present

## 2020-09-12 DIAGNOSIS — D509 Iron deficiency anemia, unspecified: Secondary | ICD-10-CM | POA: Diagnosis not present

## 2020-09-12 DIAGNOSIS — N186 End stage renal disease: Secondary | ICD-10-CM | POA: Diagnosis not present

## 2020-09-12 DIAGNOSIS — D631 Anemia in chronic kidney disease: Secondary | ICD-10-CM | POA: Diagnosis not present

## 2020-09-12 DIAGNOSIS — N2581 Secondary hyperparathyroidism of renal origin: Secondary | ICD-10-CM | POA: Diagnosis not present

## 2020-09-13 DIAGNOSIS — N186 End stage renal disease: Secondary | ICD-10-CM | POA: Diagnosis not present

## 2020-09-13 DIAGNOSIS — D509 Iron deficiency anemia, unspecified: Secondary | ICD-10-CM | POA: Diagnosis not present

## 2020-09-13 DIAGNOSIS — D631 Anemia in chronic kidney disease: Secondary | ICD-10-CM | POA: Diagnosis not present

## 2020-09-13 DIAGNOSIS — N2581 Secondary hyperparathyroidism of renal origin: Secondary | ICD-10-CM | POA: Diagnosis not present

## 2020-09-14 DIAGNOSIS — N186 End stage renal disease: Secondary | ICD-10-CM | POA: Diagnosis not present

## 2020-09-14 DIAGNOSIS — D509 Iron deficiency anemia, unspecified: Secondary | ICD-10-CM | POA: Diagnosis not present

## 2020-09-14 DIAGNOSIS — N2581 Secondary hyperparathyroidism of renal origin: Secondary | ICD-10-CM | POA: Diagnosis not present

## 2020-09-14 DIAGNOSIS — D631 Anemia in chronic kidney disease: Secondary | ICD-10-CM | POA: Diagnosis not present

## 2020-09-15 DIAGNOSIS — N2581 Secondary hyperparathyroidism of renal origin: Secondary | ICD-10-CM | POA: Diagnosis not present

## 2020-09-15 DIAGNOSIS — D631 Anemia in chronic kidney disease: Secondary | ICD-10-CM | POA: Diagnosis not present

## 2020-09-15 DIAGNOSIS — N186 End stage renal disease: Secondary | ICD-10-CM | POA: Diagnosis not present

## 2020-09-15 DIAGNOSIS — D509 Iron deficiency anemia, unspecified: Secondary | ICD-10-CM | POA: Diagnosis not present

## 2020-09-16 DIAGNOSIS — D631 Anemia in chronic kidney disease: Secondary | ICD-10-CM | POA: Diagnosis not present

## 2020-09-16 DIAGNOSIS — N186 End stage renal disease: Secondary | ICD-10-CM | POA: Diagnosis not present

## 2020-09-16 DIAGNOSIS — N2581 Secondary hyperparathyroidism of renal origin: Secondary | ICD-10-CM | POA: Diagnosis not present

## 2020-09-16 DIAGNOSIS — D509 Iron deficiency anemia, unspecified: Secondary | ICD-10-CM | POA: Diagnosis not present

## 2020-09-17 DIAGNOSIS — N186 End stage renal disease: Secondary | ICD-10-CM | POA: Diagnosis not present

## 2020-09-17 DIAGNOSIS — D631 Anemia in chronic kidney disease: Secondary | ICD-10-CM | POA: Diagnosis not present

## 2020-09-17 DIAGNOSIS — D509 Iron deficiency anemia, unspecified: Secondary | ICD-10-CM | POA: Diagnosis not present

## 2020-09-17 DIAGNOSIS — N2581 Secondary hyperparathyroidism of renal origin: Secondary | ICD-10-CM | POA: Diagnosis not present

## 2020-09-18 DIAGNOSIS — D509 Iron deficiency anemia, unspecified: Secondary | ICD-10-CM | POA: Diagnosis not present

## 2020-09-18 DIAGNOSIS — N2581 Secondary hyperparathyroidism of renal origin: Secondary | ICD-10-CM | POA: Diagnosis not present

## 2020-09-18 DIAGNOSIS — N186 End stage renal disease: Secondary | ICD-10-CM | POA: Diagnosis not present

## 2020-09-18 DIAGNOSIS — D631 Anemia in chronic kidney disease: Secondary | ICD-10-CM | POA: Diagnosis not present

## 2020-09-19 DIAGNOSIS — N186 End stage renal disease: Secondary | ICD-10-CM | POA: Diagnosis not present

## 2020-09-19 DIAGNOSIS — N2581 Secondary hyperparathyroidism of renal origin: Secondary | ICD-10-CM | POA: Diagnosis not present

## 2020-09-19 DIAGNOSIS — D631 Anemia in chronic kidney disease: Secondary | ICD-10-CM | POA: Diagnosis not present

## 2020-09-19 DIAGNOSIS — D509 Iron deficiency anemia, unspecified: Secondary | ICD-10-CM | POA: Diagnosis not present

## 2020-09-20 DIAGNOSIS — D509 Iron deficiency anemia, unspecified: Secondary | ICD-10-CM | POA: Diagnosis not present

## 2020-09-20 DIAGNOSIS — N186 End stage renal disease: Secondary | ICD-10-CM | POA: Diagnosis not present

## 2020-09-20 DIAGNOSIS — N2581 Secondary hyperparathyroidism of renal origin: Secondary | ICD-10-CM | POA: Diagnosis not present

## 2020-09-20 DIAGNOSIS — D631 Anemia in chronic kidney disease: Secondary | ICD-10-CM | POA: Diagnosis not present

## 2020-09-21 DIAGNOSIS — D631 Anemia in chronic kidney disease: Secondary | ICD-10-CM | POA: Diagnosis not present

## 2020-09-21 DIAGNOSIS — N2581 Secondary hyperparathyroidism of renal origin: Secondary | ICD-10-CM | POA: Diagnosis not present

## 2020-09-21 DIAGNOSIS — N186 End stage renal disease: Secondary | ICD-10-CM | POA: Diagnosis not present

## 2020-09-21 DIAGNOSIS — D509 Iron deficiency anemia, unspecified: Secondary | ICD-10-CM | POA: Diagnosis not present

## 2020-09-22 DIAGNOSIS — D509 Iron deficiency anemia, unspecified: Secondary | ICD-10-CM | POA: Diagnosis not present

## 2020-09-22 DIAGNOSIS — D631 Anemia in chronic kidney disease: Secondary | ICD-10-CM | POA: Diagnosis not present

## 2020-09-22 DIAGNOSIS — N186 End stage renal disease: Secondary | ICD-10-CM | POA: Diagnosis not present

## 2020-09-22 DIAGNOSIS — N2581 Secondary hyperparathyroidism of renal origin: Secondary | ICD-10-CM | POA: Diagnosis not present

## 2020-09-23 DIAGNOSIS — Z992 Dependence on renal dialysis: Secondary | ICD-10-CM | POA: Diagnosis not present

## 2020-09-23 DIAGNOSIS — D509 Iron deficiency anemia, unspecified: Secondary | ICD-10-CM | POA: Diagnosis not present

## 2020-09-23 DIAGNOSIS — D631 Anemia in chronic kidney disease: Secondary | ICD-10-CM | POA: Diagnosis not present

## 2020-09-23 DIAGNOSIS — N2581 Secondary hyperparathyroidism of renal origin: Secondary | ICD-10-CM | POA: Diagnosis not present

## 2020-09-23 DIAGNOSIS — N186 End stage renal disease: Secondary | ICD-10-CM | POA: Diagnosis not present

## 2020-09-24 DIAGNOSIS — D509 Iron deficiency anemia, unspecified: Secondary | ICD-10-CM | POA: Diagnosis not present

## 2020-09-24 DIAGNOSIS — N2581 Secondary hyperparathyroidism of renal origin: Secondary | ICD-10-CM | POA: Diagnosis not present

## 2020-09-24 DIAGNOSIS — D631 Anemia in chronic kidney disease: Secondary | ICD-10-CM | POA: Diagnosis not present

## 2020-09-24 DIAGNOSIS — N186 End stage renal disease: Secondary | ICD-10-CM | POA: Diagnosis not present

## 2020-09-25 DIAGNOSIS — D631 Anemia in chronic kidney disease: Secondary | ICD-10-CM | POA: Diagnosis not present

## 2020-09-25 DIAGNOSIS — N186 End stage renal disease: Secondary | ICD-10-CM | POA: Diagnosis not present

## 2020-09-25 DIAGNOSIS — N2581 Secondary hyperparathyroidism of renal origin: Secondary | ICD-10-CM | POA: Diagnosis not present

## 2020-09-25 DIAGNOSIS — D509 Iron deficiency anemia, unspecified: Secondary | ICD-10-CM | POA: Diagnosis not present

## 2020-09-26 DIAGNOSIS — N2581 Secondary hyperparathyroidism of renal origin: Secondary | ICD-10-CM | POA: Diagnosis not present

## 2020-09-26 DIAGNOSIS — N186 End stage renal disease: Secondary | ICD-10-CM | POA: Diagnosis not present

## 2020-09-26 DIAGNOSIS — D631 Anemia in chronic kidney disease: Secondary | ICD-10-CM | POA: Diagnosis not present

## 2020-09-26 DIAGNOSIS — D509 Iron deficiency anemia, unspecified: Secondary | ICD-10-CM | POA: Diagnosis not present

## 2020-09-27 ENCOUNTER — Other Ambulatory Visit: Payer: Self-pay | Admitting: Internal Medicine

## 2020-09-27 DIAGNOSIS — D509 Iron deficiency anemia, unspecified: Secondary | ICD-10-CM | POA: Diagnosis not present

## 2020-09-27 DIAGNOSIS — D631 Anemia in chronic kidney disease: Secondary | ICD-10-CM | POA: Diagnosis not present

## 2020-09-27 DIAGNOSIS — N186 End stage renal disease: Secondary | ICD-10-CM | POA: Diagnosis not present

## 2020-09-27 DIAGNOSIS — N2581 Secondary hyperparathyroidism of renal origin: Secondary | ICD-10-CM | POA: Diagnosis not present

## 2020-09-28 ENCOUNTER — Other Ambulatory Visit: Payer: Self-pay

## 2020-09-28 ENCOUNTER — Encounter (HOSPITAL_BASED_OUTPATIENT_CLINIC_OR_DEPARTMENT_OTHER): Payer: Medicare HMO | Attending: Internal Medicine | Admitting: Internal Medicine

## 2020-09-28 DIAGNOSIS — L97818 Non-pressure chronic ulcer of other part of right lower leg with other specified severity: Secondary | ICD-10-CM | POA: Insufficient documentation

## 2020-09-28 DIAGNOSIS — L97819 Non-pressure chronic ulcer of other part of right lower leg with unspecified severity: Secondary | ICD-10-CM | POA: Diagnosis present

## 2020-09-28 DIAGNOSIS — W228XXD Striking against or struck by other objects, subsequent encounter: Secondary | ICD-10-CM | POA: Insufficient documentation

## 2020-09-28 DIAGNOSIS — L97822 Non-pressure chronic ulcer of other part of left lower leg with fat layer exposed: Secondary | ICD-10-CM | POA: Diagnosis not present

## 2020-09-28 DIAGNOSIS — Z794 Long term (current) use of insulin: Secondary | ICD-10-CM | POA: Diagnosis not present

## 2020-09-28 DIAGNOSIS — E1122 Type 2 diabetes mellitus with diabetic chronic kidney disease: Secondary | ICD-10-CM | POA: Diagnosis not present

## 2020-09-28 DIAGNOSIS — I69951 Hemiplegia and hemiparesis following unspecified cerebrovascular disease affecting right dominant side: Secondary | ICD-10-CM | POA: Diagnosis not present

## 2020-09-28 DIAGNOSIS — I872 Venous insufficiency (chronic) (peripheral): Secondary | ICD-10-CM | POA: Diagnosis not present

## 2020-09-28 DIAGNOSIS — Z992 Dependence on renal dialysis: Secondary | ICD-10-CM | POA: Diagnosis not present

## 2020-09-28 DIAGNOSIS — N2581 Secondary hyperparathyroidism of renal origin: Secondary | ICD-10-CM | POA: Diagnosis not present

## 2020-09-28 DIAGNOSIS — N186 End stage renal disease: Secondary | ICD-10-CM | POA: Diagnosis not present

## 2020-09-28 DIAGNOSIS — I120 Hypertensive chronic kidney disease with stage 5 chronic kidney disease or end stage renal disease: Secondary | ICD-10-CM | POA: Insufficient documentation

## 2020-09-28 DIAGNOSIS — D509 Iron deficiency anemia, unspecified: Secondary | ICD-10-CM | POA: Diagnosis not present

## 2020-09-28 DIAGNOSIS — S80812D Abrasion, left lower leg, subsequent encounter: Secondary | ICD-10-CM | POA: Insufficient documentation

## 2020-09-28 DIAGNOSIS — D631 Anemia in chronic kidney disease: Secondary | ICD-10-CM | POA: Diagnosis not present

## 2020-09-28 DIAGNOSIS — E11622 Type 2 diabetes mellitus with other skin ulcer: Secondary | ICD-10-CM | POA: Insufficient documentation

## 2020-09-28 NOTE — Progress Notes (Signed)
Mary, Valencia ([id number]) Visit Report for 09/28/2020 Abuse/Suicide Risk Screen Details Patient Name: Date of Service: Mary, Valencia 09/28/2020 1:15 PM Medical Record Number: [id number] Patient Account Number: [id number] Date of Birth/Sex: Treating RN: [birth year] (65 y.o. Female) Lorrin Jackson Primary Care Dawnell Bryant: Cassandria Anger Other Clinician: Referring Silvia Markuson: Treating Julieana Eshleman/Extender: Thayer Headings in Treatment: 0 Abuse/Suicide Risk Screen Items Answer ABUSE RISK SCREEN: Has anyone close to you tried to hurt or harm you recentlyo No Do you feel uncomfortable with anyone in your familyo No Has anyone forced you do things that you didnt want to doo No Electronic Signature(s) Signed: 09/28/2020 5:08:59 PM By: Lorrin Jackson Entered By: Lorrin Jackson on 09/28/2020 13:59:46 -------------------------------------------------------------------------------- Activities of Daily Living Details Patient Name: Date of Service: Mary, Valencia. 09/28/2020 1:15 PM Medical Record Number: [id number] Patient Account Number: [id number] Date of Birth/Sex: Treating RN: [birth year] (65 y.o. Female) Lorrin Jackson Primary Care Elisabetta Mishra: Cassandria Anger Other Clinician: Referring Ollis Daudelin: Treating Danasha Melman/Extender: Thayer Headings in Treatment: 0 Activities of Daily Living Items Answer Activities of Daily Living (Please select one for each item) Drive Automobile Not Able T Medications ake Need Assistance Use T elephone Completely Able Care for Appearance Need Assistance Use T oilet Need Assistance Bath / Shower Need Assistance Dress Self Need Assistance Feed Self Completely Able Walk Need Assistance Get In / Out Bed Need Assistance Housework Need Assistance Prepare Meals Need Assistance Handle Money Need Assistance Shop for Self Need Assistance Electronic Signature(s) Signed: 09/28/2020 5:08:59 PM By: Lorrin Jackson Entered By: Lorrin Jackson on 09/28/2020 14:00:26 -------------------------------------------------------------------------------- Education Screening Details Patient Name: Date of Service: Mary Cumins. 09/28/2020 1:15 PM Medical Record Number: [id number] Patient Account Number: [id number] Date of Birth/Sex: Treating RN: [birth year] (64 y.o. Female) Lorrin Jackson Primary Care Ludella Pranger: Cassandria Anger Other Clinician: Referring Keilan Nichol: Treating Eureka Valdes/Extender: Thayer Headings in Treatment: 0 Primary Learner Assessed: Patient Learning Preferences/Education Level/Primary Language Learning Preference: Explanation, Demonstration, Printed Material Highest Education Level: College or Above Preferred Language: English Cognitive Barrier Language Barrier: No Translator Needed: No Memory Deficit: No Emotional Barrier: No Cultural/Religious Beliefs Affecting Medical Care: No Physical Barrier Impaired Vision: Yes Glasses Impaired Hearing: No Decreased Hand dexterity: Yes Limitations: right hand weakness r/t CVA Knowledge/Comprehension Knowledge Level: High Comprehension Level: High Ability to understand written instructions: High Ability to understand verbal instructions: High Motivation Anxiety Level: Calm Cooperation: Cooperative Education Importance: Acknowledges Need Interest in Health Problems: Asks Questions Perception: Coherent Willingness to Engage in Self-Management High Activities: Readiness to Engage in Self-Management High Activities: Electronic Signature(s) Signed: 09/28/2020 5:08:59 PM By: Lorrin Jackson Entered By: Lorrin Jackson on 09/28/2020 14:01:21 -------------------------------------------------------------------------------- Fall Risk Assessment Details Patient Name: Date of Service: Mary Cumins. 09/28/2020 1:15 PM Medical Record Number: [id number] Patient Account Number: [id number] Date of  Birth/Sex: Treating RN: [birth year] (64 y.o. Female) Lorrin Jackson Primary Care Joeanna Howdyshell: Cassandria Anger Other Clinician: Referring Kortnee Bas: Treating Javia Dillow/Extender: Thayer Headings in Treatment: 0 Fall Risk Assessment Items Have you had 2 or more falls in the last 12 monthso 0 No Have you had any fall that resulted in injury in the last 12 monthso 0 No FALLS RISK SCREEN History of falling - immediate or within 3 months 0 No Secondary diagnosis (Do you have 2 or more medical diagnoseso) 0 No Ambulatory aid None/bed rest/wheelchair/nurse 0 No Crutches/cane/walker 15 Yes Furniture 0 No Intravenous therapy Access/Saline/Heparin Lock 0 No Gait/Transferring  Normal/ bed rest/ wheelchair 0 Yes Weak (short steps with or without shuffle, stooped but able to lift head while walking, may seek 0 No support from furniture) Impaired (short steps with shuffle, may have difficulty arising from chair, head down, impaired 0 No balance) Mental Status Oriented to own ability 0 Yes Electronic Signature(s) Signed: 09/28/2020 5:08:59 PM By: Lorrin Jackson Entered By: Lorrin Jackson on 09/28/2020 14:01:44 -------------------------------------------------------------------------------- Foot Assessment Details Patient Name: Date of Service: Mary Cumins. 09/28/2020 1:15 PM Medical Record Number: [id number] Patient Account Number: [id number] Date of Birth/Sex: Treating RN: [birth year] (65 y.o. Female) Lorrin Jackson Primary Care Fontaine Kossman: Cassandria Anger Other Clinician: Referring Shatora Weatherbee: Treating Manoah Deckard/Extender: Thayer Headings in Treatment: 0 Foot Assessment Items Site Locations + = Sensation present, - = Sensation absent, C = Callus, U = Ulcer R = Redness, W = Warmth, M = Maceration, PU = Pre-ulcerative lesion F = Fissure, S = Swelling, D = Dryness Assessment Right: Left: Other Deformity: No No Prior Foot Ulcer: No  No Prior Amputation: No No Charcot Joint: No No Ambulatory Status: Ambulatory With Help Assistance Device: Cane Gait: Steady Electronic Signature(s) Signed: 09/28/2020 5:08:59 PM By: Lorrin Jackson Entered By: Lorrin Jackson on 09/28/2020 14:03:02 -------------------------------------------------------------------------------- Nutrition Risk Screening Details Patient Name: Date of Service: Mary, Valencia. 09/28/2020 1:15 PM Medical Record Number: [id number] Patient Account Number: [id number] Date of Birth/Sex: Treating RN: 12/06/1955 (65 y.o. Female) Lorrin Jackson Primary Care Mariyanna Mucha: Cassandria Anger Other Clinician: Referring Rjay Revolorio: Treating Fletcher Ostermiller/Extender: Thayer Headings in Treatment: 0 Height (in): 62 Weight (lbs): 132 Body Mass Index (BMI): 24.1 Nutrition Risk Screening Items Score Screening NUTRITION RISK SCREEN: I have an illness or condition that made me change the kind and/or amount of food I eat 0 No I eat fewer than two meals per day 0 No I eat few fruits and vegetables, or milk products 0 No I have three or more drinks of beer, liquor or wine almost every day 0 No I have tooth or mouth problems that make it hard for me to eat 0 No I don't always have enough money to buy the food I need 0 No I eat alone most of the time 0 No I take three or more different prescribed or over-the-counter drugs a day 1 Yes Without wanting to, I have lost or gained 10 pounds in the last six months 0 No I am not always physically able to shop, cook and/or feed myself 0 No Nutrition Protocols Good Risk Protocol 0 No interventions needed Moderate Risk Protocol High Risk Proctocol Risk Level: Good Risk Score: 1 Electronic Signature(s) Signed: 09/28/2020 5:08:59 PM By: Lorrin Jackson Entered By: Lorrin Jackson on 09/28/2020 14:01:58

## 2020-09-29 DIAGNOSIS — N2581 Secondary hyperparathyroidism of renal origin: Secondary | ICD-10-CM | POA: Diagnosis not present

## 2020-09-29 DIAGNOSIS — N186 End stage renal disease: Secondary | ICD-10-CM | POA: Diagnosis not present

## 2020-09-29 DIAGNOSIS — D631 Anemia in chronic kidney disease: Secondary | ICD-10-CM | POA: Diagnosis not present

## 2020-09-29 DIAGNOSIS — D509 Iron deficiency anemia, unspecified: Secondary | ICD-10-CM | POA: Diagnosis not present

## 2020-09-29 NOTE — Progress Notes (Signed)
HARRIETT, AZAR ([id number]) Visit Report for 09/28/2020 Chief Complaint Document Details Patient Name: Date of Service: Mary Valencia, Mary Valencia 09/28/2020 1:15 PM Medical Record Number: [id number] Patient Account Number: [id number] Date of Birth/Sex: Treating RN: September 22, 1955 (65 y.o. Female) Rhae Hammock Primary Care Provider: Cassandria Anger Other Clinician: Referring Provider: Treating Provider/Extender: Thayer Headings in Treatment: 0 Information Obtained from: Patient Chief Complaint 09/28/2020; patient is here for review of wound on the left anterior mid tibia area initially starting as a traumatic wound Electronic Signature(s) Signed: 09/28/2020 4:57:40 PM By: Linton Ham MD Entered By: Linton Ham on 09/28/2020 14:58:09 -------------------------------------------------------------------------------- Debridement Details Patient Name: Date of Service: Mary Cumins. 09/28/2020 1:15 PM Medical Record Number: [id number] Patient Account Number: [id number] Date of Birth/Sex: Treating RN: [birth year] (65 y.o. Female) Rhae Hammock Primary Care Provider: Cassandria Anger Other Clinician: Referring Provider: Treating Provider/Extender: Thayer Headings in Treatment: 0 Debridement Performed for Assessment: Wound #1 Left,Anterior Lower Leg Performed By: Physician Ricard Dillon., MD Debridement Type: Debridement Severity of Tissue Pre Debridement: Fat layer exposed Level of Consciousness (Pre-procedure): Awake and Alert Pre-procedure Verification/Time Out Yes - 14:47 Taken: Start Time: 14:47 Pain Control: Lidocaine T Area Debrided (L x W): otal 0.9 (cm) x 0.9 (cm) = 0.81 (cm) Tissue and other material debrided: Slough, Subcutaneous, Skin: Dermis , Skin: Epidermis, Slough Level: Skin/Subcutaneous Tissue Debridement Description: Excisional Instrument: Curette Bleeding: Minimum Hemostasis Achieved:  Pressure End Time: 14:48 Procedural Pain: 0 Post Procedural Pain: 0 Response to Treatment: Procedure was tolerated well Level of Consciousness (Post- Awake and Alert procedure): Post Debridement Measurements of Total Wound Length: (cm) 0.9 Width: (cm) 0.9 Depth: (cm) 0.1 Volume: (cm) 0.064 Character of Wound/Ulcer Post Debridement: Improved Severity of Tissue Post Debridement: Fat layer exposed Post Procedure Diagnosis Same as Pre-procedure Electronic Signature(s) Signed: 09/28/2020 4:57:40 PM By: Linton Ham MD Signed: 09/29/2020 5:28:50 PM By: Rhae Hammock RN Entered By: Rhae Hammock on 09/28/2020 14:49:26 -------------------------------------------------------------------------------- HPI Details Patient Name: Date of Service: Mary Cumins. 09/28/2020 1:15 PM Medical Record Number: [id number] Patient Account Number: [id number] Date of Birth/Sex: Treating RN: 06-Mar-1956 (65 y.o. Female) Rhae Hammock Primary Care Provider: Cassandria Anger Other Clinician: Referring Provider: Treating Provider/Extender: Thayer Headings in Treatment: 0 History of Present Illness HPI Description: ADMISSION 09/28/2020 This is a 65 year old woman who is a type II diabetic. She tells [place] that in early February she was using her motorized wheelchair at home and it inadvertently put it into gear moving forward and she traumatized her left anterior leg. This is left her with a nonhealing wound in this area. She has not been specifically dressing this at all including no covering dressing. She came into the clinic with a eschar in place this was removed and cleaning the wound surface it looks reasonably clean after this. 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 She is apparently lost a considerably amount of weight although this was intended. Also mentioned his cirrhosis of the liver I do not have any  additional information on this. ABI in our clinic was noncompressible on the low Electronic Signature(s) Signed: 09/28/2020 4:57:40 PM By: Linton Ham MD Entered By: Linton Ham on 09/28/2020 14:59:54 -------------------------------------------------------------------------------- Physical Exam Details Patient Name: Date of Service: Mary Cumins. 09/28/2020 1:15 PM Medical Record Number: [id number] Patient Account Number: [id number] Date of Birth/Sex: Treating RN: 18-Apr-1956 (65 y.o. Female) Rhae Hammock Primary  Care Provider: Cassandria Anger Other Clinician: Referring Provider: Treating Provider/Extender: Thayer Headings in Treatment: 0 Constitutional Patient is hypertensive.. Pulse regular and within target range for patient.Marland Kitchen Respirations regular, non-labored and within target range.. Temperature is normal and within the target range for the patient.Marland Kitchen Appears in no distress. Cardiovascular Both her dorsalis pedis and posterior tibial pulses are palpable. Integumentary (Hair, Skin) Some mild erythema around the wound but no palpable tenderness.Marland Kitchen Psychiatric appears at normal baseline. Notes Wound exam; left anterior mid tibia. Under illumination there was a rim of skin coming from the lateral part of the wound however he runs into very adherent surface debris. I used a #3 curette to remove this down to subcutaneous tissue. Hemostasis with direct pressure no evidence of infection no cultures were done Electronic Signature(s) Signed: 09/28/2020 4:57:40 PM By: Linton Ham MD Entered By: Linton Ham on 09/28/2020 15:03:43 -------------------------------------------------------------------------------- Physician Orders Details Patient Name: Date of Service: Mary Cumins. 09/28/2020 1:15 PM Medical Record Number: [id number] Patient Account Number: [id number] Date of Birth/Sex: Treating RN: 01-15-1956 (65 y.o. Female) Rhae Hammock Primary Care Provider: Cassandria Anger Other Clinician: Referring Provider: Treating Provider/Extender: Thayer Headings in Treatment: 0 Verbal / Phone Orders: No Diagnosis Coding Follow-up Appointments Return Appointment in 1 week. Bathing/ Shower/ Hygiene May shower and wash wound with soap and water. - on days dressing gets changed Edema Control - Lymphedema / SCD / Other Elevate legs to the level of the heart or above for 30 minutes daily and/or when sitting, a frequency of: Avoid standing for long periods of time. Wound Treatment Wound #1 - Lower Leg Wound Laterality: Left, Anterior Cleanser: Normal Saline Every Other Day Discharge Instructions: Cleanse the wound with Normal Saline prior to applying a clean dressing using gauze sponges, not tissue or cotton balls. Cleanser: Soap and Water Every Other Day Discharge Instructions: May shower and wash wound with dial antibacterial soap and water prior to dressing change. Prim Dressing: Promogran Prisma Matrix, 4.34 (sq in) (silver collagen) Every Other Day ary Discharge Instructions: Moisten collagen with saline or hydrogel Secondary Dressing: ComfortFoam Border, 3x3 in (silicone border) Every Other Day Discharge Instructions: Apply over primary dressing as directed. Electronic Signature(s) Signed: 09/28/2020 4:57:40 PM By: Linton Ham MD Signed: 09/29/2020 5:28:50 PM By: Rhae Hammock RN Entered By: Rhae Hammock on 09/28/2020 14:51:20 -------------------------------------------------------------------------------- Problem List Details Patient Name: Date of Service: Mary Cumins. 09/28/2020 1:15 PM Medical Record Number: [id number] Patient Account Number: [id number] Date of Birth/Sex: Treating RN: [birth year] (65 y.o. Female) Rhae Hammock Primary Care Provider: Cassandria Anger Other Clinician: Referring Provider: Treating Provider/Extender: Thayer Headings in Treatment: 0 Active Problems ICD-10 Encounter Code Description Active Date MDM Diagnosis E11.622 Type 2 diabetes mellitus with other skin ulcer 09/28/2020 No Yes S80.812D Abrasion, left lower leg, subsequent encounter 09/28/2020 No Yes L97.818 Non-pressure chronic ulcer of other part of right lower leg with other specified 09/28/2020 No Yes severity Inactive Problems Resolved Problems Electronic Signature(s) Signed: 09/28/2020 4:57:40 PM By: Linton Ham MD Entered By: Linton Ham on 09/28/2020 14:55:58 -------------------------------------------------------------------------------- Progress Note Details Patient Name: Date of Service: Mary Cumins. 09/28/2020 1:15 PM Medical Record Number: [id number] Patient Account Number: [id number] Date of Birth/Sex: Treating RN: 1955/11/23 (65 y.o. Female) Rhae Hammock Primary Care Provider: Cassandria Anger Other Clinician: Referring Provider: Treating Provider/Extender: Thayer Headings in Treatment: 0 Subjective Chief Complaint Information obtained from Patient 09/28/2020;  patient is here for review of wound on the left anterior mid tibia area initially starting as a traumatic wound History of Present Illness (HPI) ADMISSION 09/28/2020 This is a 65 year old woman who is a type II diabetic. She tells [place] that in early February she was using her motorized wheelchair at home and it inadvertently put it into gear moving forward and she traumatized her left anterior leg. This is left her with a nonhealing wound in this area. She has not been specifically dressing this at all including no covering dressing. She came into the clinic with a eschar in place this was removed and cleaning the wound surface it looks reasonably clean after this. 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 She is apparently lost a considerably  amount of weight although this was intended. Also mentioned his cirrhosis of the liver I do not have any additional information on this. ABI in our clinic was noncompressible on the low Patient History Information obtained from Patient. Allergies penicillin (Reaction: rash, swelling), Sulfa (Sulfonamide Antibiotics) (Reaction: rash, swelling), naproxen sodium (Reaction: Hives), Betadine (Reaction: rash), clonidine, iodine, gabapentin, T etracyclines Family History Cancer - Siblings, Diabetes - Mother, Heart Disease - Father, Hypertension - Father, Kidney Disease - Mother, Lung Disease - Mother, Tuberculosis - Mother, No family history of Hereditary Spherocytosis, Seizures, Stroke, Thyroid Problems. Social History Never smoker, Marital Status - Widowed, Alcohol Use - Never, Drug Use - No History, Caffeine Use - Daily. Medical History Ear/Nose/Mouth/Throat Patient has history of Chronic sinus problems/congestion Cardiovascular Patient has history of Coronary Artery Disease Endocrine Patient has history of Type II Diabetes Genitourinary Patient has history of End Stage Renal Disease - Peritoneal Dialysis Patient is treated with Insulin. Blood sugar is tested. Medical A Surgical History Notes nd Cardiovascular TIA-CVA w/right hemiparesis Review of Systems (ROS) Eyes Complains or has symptoms of Glasses / Contacts. Ear/Nose/Mouth/Throat Complains or has symptoms of Chronic sinus problems or rhinitis. Gastrointestinal Denies complaints or symptoms of Frequent diarrhea, Nausea, Vomiting. Integumentary (Skin) Complains or has symptoms of Wounds. Musculoskeletal Denies complaints or symptoms of Muscle Pain, Muscle Weakness. Neurologic Denies complaints or symptoms of Numbness/parasthesias. Psychiatric Denies complaints or symptoms of Claustrophobia, Suicidal. Objective Constitutional Patient is hypertensive.. Pulse regular and within target range for patient.Marland Kitchen Respirations regular,  non-labored and within target range.. Temperature is normal and within the target range for the patient.Marland Kitchen Appears in no distress. Vitals Time Taken: 1:45 PM, Height: 62 in, Source: Stated, Weight: 132 lbs, Source: Stated, BMI: 24.1, Temperature: 98.4 F, Pulse: 81 bpm, Respiratory Rate: 18 breaths/min, Blood Pressure: 183/69 mmHg, Capillary Blood Glucose: 230 mg/dl. Cardiovascular Both her dorsalis pedis and posterior tibial pulses are palpable. Psychiatric appears at normal baseline. General Notes: Wound exam; left anterior mid tibia. Under illumination there was a rim of skin coming from the lateral part of the wound however he runs into very adherent surface debris. I used a #3 curette to remove this down to subcutaneous tissue. Hemostasis with direct pressure no evidence of infection no cultures were done Integumentary (Hair, Skin) Some mild erythema around the wound but no palpable tenderness.. Wound #1 status is Open. Original cause of wound was Trauma. The date acquired was: 07/27/2020. The wound is located on the Left,Anterior Lower Leg. The wound measures 0.9cm length x 0.9cm width x 0.1cm depth; 0.636cm^2 area and 0.064cm^3 volume. There is Fat Layer (Subcutaneous Tissue) exposed. There is no tunneling or undermining noted. There is a medium amount of serosanguineous  drainage noted. The wound margin is distinct with the outline attached to the wound base. There is large (67-100%) red granulation within the wound bed. There is no necrotic tissue within the wound bed. Assessment Active Problems ICD-10 Type 2 diabetes mellitus with other skin ulcer Abrasion, left lower leg, subsequent encounter Non-pressure chronic ulcer of other part of right lower leg with other specified severity Procedures Wound #1 Pre-procedure diagnosis of Wound #1 is a Venous Leg Ulcer located on the Left,Anterior Lower Leg .Severity of Tissue Pre Debridement is: Fat layer exposed. There was a Excisional  Skin/Subcutaneous Tissue Debridement with a total area of 0.81 sq cm performed by Ricard Dillon., MD. With the following instrument(s): Curette Material removed includes Subcutaneous Tissue, Slough, Skin: Dermis, and Skin: Epidermis after achieving pain control using Lidocaine. No specimens were taken. A time out was conducted at 14:47, prior to the start of the procedure. A Minimum amount of bleeding was controlled with Pressure. The procedure was tolerated well with a pain level of 0 throughout and a pain level of 0 following the procedure. Post Debridement Measurements: 0.9cm length x 0.9cm width x 0.1cm depth; 0.064cm^3 volume. Character of Wound/Ulcer Post Debridement is improved. Severity of Tissue Post Debridement is: Fat layer exposed. Post procedure Diagnosis Wound #1: Same as Pre-Procedure Plan Follow-up Appointments: Return Appointment in 1 week. Bathing/ Shower/ Hygiene: May shower and wash wound with soap and water. - on days dressing gets changed Edema Control - Lymphedema / SCD / Other: Elevate legs to the level of the heart or above for 30 minutes daily and/or when sitting, a frequency of: Avoid standing for long periods of time. WOUND #1: - Lower Leg Wound Laterality: Left, Anterior Cleanser: Normal Saline Every Other Day/ Discharge Instructions: Cleanse the wound with Normal Saline prior to applying a clean dressing using gauze sponges, not tissue or cotton balls. Cleanser: Soap and Water Every Other Day/ Discharge Instructions: May shower and wash wound with dial antibacterial soap and water prior to dressing change. Prim Dressing: Promogran Prisma Matrix, 4.34 (sq in) (silver collagen) Every Other Day/ ary Discharge Instructions: Moisten collagen with saline or hydrogel Secondary Dressing: ComfortFoam Border, 3x3 in (silicone border) Every Other Day/ Discharge Instructions: Apply over primary dressing as directed. 1. I am going to use silver collagen on this after  debridement border foam the patient will change this every second day we gave her instructions on this 2. Initially traumatic wound that is not healed. She has not been specifically putting any dressing on this simply leaving it open to air 3. Fortunately not any evidence of infection 4. Although her ABIs were noncompressible in our clinic her peripheral pulses are palpable. I am not feeling that she has significant arterial insufficiency to impede healing of a wound on her left anterior mid tibia. Electronic Signature(s) Signed: 09/28/2020 4:57:40 PM By: Linton Ham MD Entered By: Linton Ham on 09/28/2020 15:05:30 -------------------------------------------------------------------------------- HxROS Details Patient Name: Date of Service: Mary Cumins. 09/28/2020 1:15 PM Medical Record Number: [id number] Patient Account Number: [id number] Date of Birth/Sex: Treating RN: 1956-03-01 (65 y.o. Female) Lorrin Jackson Primary Care Provider: Cassandria Anger Other Clinician: Referring Provider: Treating Provider/Extender: Thayer Headings in Treatment: 0 Information Obtained From Patient Eyes Complaints and Symptoms: Positive for: Glasses / Contacts Ear/Nose/Mouth/Throat Complaints and Symptoms: Positive for: Chronic sinus problems or rhinitis Medical History: Positive for: Chronic sinus problems/congestion Gastrointestinal Complaints and Symptoms: Negative for: Frequent diarrhea; Nausea; Vomiting Integumentary (Skin) Complaints and Symptoms:  Positive for: Wounds Musculoskeletal Complaints and Symptoms: Negative for: Muscle Pain; Muscle Weakness Neurologic Complaints and Symptoms: Negative for: Numbness/parasthesias Psychiatric Complaints and Symptoms: Negative for: Claustrophobia; Suicidal Cardiovascular Medical History: Positive for: Coronary Artery Disease Past Medical History Notes: TIA-CVA w/right hemiparesis Endocrine Medical  History: Positive for: Type II Diabetes Treated with: Insulin Blood sugar tested every day: Yes Tested : 2x day Genitourinary Medical History: Positive for: End Stage Renal Disease - Peritoneal Dialysis Immunological Oncologic HBO Extended History Items Ear/Nose/Mouth/Throat: Chronic sinus problems/congestion Immunizations Pneumococcal Vaccine: Received Pneumococcal Vaccination: Yes Implantable Devices Yes Family and Social History Cancer: Yes - Siblings; Diabetes: Yes - Mother; Heart Disease: Yes - Father; Hereditary Spherocytosis: No; Hypertension: Yes - Father; Kidney Disease: Yes - Mother; Lung Disease: Yes - Mother; Seizures: No; Stroke: No; Thyroid Problems: No; Tuberculosis: Yes - Mother; Never smoker; Marital Status - Widowed; Alcohol Use: Never; Drug Use: No History; Caffeine Use: Daily; Financial Concerns: No; Food, Clothing or Shelter Needs: No; Support System Lacking: No; Transportation Concerns: No Electronic Signature(s) Signed: 09/28/2020 4:57:40 PM By: Linton Ham MD Signed: 09/28/2020 5:08:59 PM By: Lorrin Jackson Entered By: Lorrin Jackson on 09/28/2020 13:59:34 -------------------------------------------------------------------------------- SuperBill Details Patient Name: Date of Service: Mary Cumins. 09/28/2020 Medical Record Number: [id number] Patient Account Number: [id number] Date of Birth/Sex: Treating RN: 30-Jul-1955 (65 y.o. Female) Rhae Hammock Primary Care Provider: Cassandria Anger Other Clinician: Referring Provider: Treating Provider/Extender: Thayer Headings in Treatment: 0 Diagnosis Coding ICD-10 Codes Code Description [phone] Type 2 diabetes mellitus with other skin ulcer S80.812D Abrasion, left lower leg, subsequent encounter L97.818 Non-pressure chronic ulcer of other part of right lower leg with other specified severity Facility Procedures CPT4 Code: [id number] Description: 83382 - WOUND CARE  VISIT-LEV 3 EST PT Modifier: Quantity: 1 CPT4 Code: [id number] Description: 34193 - DEB SUBQ TISSUE 20 SQ CM/< ICD-10 Diagnosis Description L97.818 Non-pressure chronic ulcer of other part of right lower leg with other specified Modifier: severity Quantity: 1 Physician Procedures : CPT4 Code Description Modifier [id number] [place] PHYS LEVEL 3 NEW PT 25 ICD-10 Diagnosis Description E11.622 Type 2 diabetes mellitus with other skin ulcer S80.812D Abrasion, left lower leg, subsequent encounter L97.818 Non-pressure chronic ulcer of other  part of right lower leg with other specified severity Quantity: 1 : [id number] - WC PHYS SUBQ TISS 20 SQ CM ICD-10 Diagnosis Description L97.818 Non-pressure chronic ulcer of other part of right lower leg with other specified severity Quantity: 1 Electronic Signature(s) Signed: 09/28/2020 4:57:40 PM By: Linton Ham MD Entered By: Linton Ham on 09/28/2020 15:05:53

## 2020-09-30 DIAGNOSIS — D509 Iron deficiency anemia, unspecified: Secondary | ICD-10-CM | POA: Diagnosis not present

## 2020-09-30 DIAGNOSIS — N186 End stage renal disease: Secondary | ICD-10-CM | POA: Diagnosis not present

## 2020-09-30 DIAGNOSIS — N2581 Secondary hyperparathyroidism of renal origin: Secondary | ICD-10-CM | POA: Diagnosis not present

## 2020-09-30 DIAGNOSIS — D631 Anemia in chronic kidney disease: Secondary | ICD-10-CM | POA: Diagnosis not present

## 2020-09-30 DIAGNOSIS — E118 Type 2 diabetes mellitus with unspecified complications: Secondary | ICD-10-CM | POA: Diagnosis not present

## 2020-09-30 NOTE — Progress Notes (Signed)
Mary Valencia, Mary Valencia ([id number]) Visit Report for 09/28/2020 Allergy List Details Patient Name: Date of Service: Mary Valencia, Mary Valencia 09/28/2020 1:15 PM Medical Record Number: [id number] Patient Account Number: [id number] Date of Birth/Sex: Treating RN: [birth year] (65 y.o. Female) Lorrin Jackson Primary Care Kaedyn Belardo: Cassandria Anger Other Clinician: Referring Caera Enwright: Treating Kemp Gomes/Extender: Thayer Headings in Treatment: 0 Allergies Active Allergies penicillin Reaction: rash, swelling Sulfa (Sulfonamide Antibiotics) Reaction: rash, swelling naproxen sodium Reaction: Hives Betadine Reaction: rash clonidine iodine gabapentin Tetracyclines Allergy Notes Electronic Signature(s) Signed: 09/28/2020 5:08:59 PM By: Lorrin Jackson Entered By: Lorrin Jackson on 09/28/2020 13:52:10 -------------------------------------------------------------------------------- Arrival Information Details Patient Name: Date of Service: Mary Cumins. 09/28/2020 1:15 PM Medical Record Number: [id number] Patient Account Number: [id number] Date of Birth/Sex: Treating RN: [birth year] (65 y.o. Female) Lorrin Jackson Primary Care Yatzary Merriweather: Cassandria Anger Other Clinician: Referring Tiffiny Worthy: Treating Daksha Koone/Extender: Thayer Headings in Treatment: 0 Visit Information Patient Arrived: Wheel Chair Arrival Time: 13:48 Transfer Assistance: None Patient Identification Verified: Yes Secondary Verification Process Completed: Yes Patient Requires Transmission-Based Precautions: No Patient Has Alerts: Yes Patient Alerts: Patient on Blood Thinner L ABI=Non comp Electronic Signature(s) Signed: 09/28/2020 5:08:59 PM By: Lorrin Jackson Entered By: Lorrin Jackson on 09/28/2020 14:07:20 -------------------------------------------------------------------------------- Clinic Level of Care Assessment Details Patient Name: Date of Service: [name], [name] 09/28/2020 1:15 PM Medical Record Number: [id number] Patient Account Number: [id number] Date of Birth/Sex: Treating RN: [birth year] (65 y.o. Female) Rhae Hammock Primary Care Jacynda Brunke: Cassandria Anger Other Clinician: Referring Alasia Enge: Treating Jessilynn Taft/Extender: Thayer Headings in Treatment: 0 Clinic Level of Care Assessment Items TOOL 1 Quantity Score X- 1 0 Use when EandM and Procedure is performed on INITIAL visit ASSESSMENTS - Nursing Assessment / Reassessment X- 1 20 General Physical Exam (combine w/ comprehensive assessment (listed just below) when performed on new pt. evals) X- 1 25 Comprehensive Assessment (HX, ROS, Risk Assessments, Wounds Hx, etc.) ASSESSMENTS - Wound and Skin Assessment / Reassessment X- 1 10 Dermatologic / Skin Assessment (not related to wound area) ASSESSMENTS - Ostomy and/or Continence Assessment and Care []  - 0 Incontinence Assessment and Management []  - 0 Ostomy Care Assessment and Management (repouching, etc.) PROCESS - Coordination of Care X - Simple Patient / Family Education for ongoing care 1 15 []  - 0 Complex (extensive) Patient / Family Education for ongoing care X- 1 10 Staff obtains Programmer, systems, Records, T Results / Process Orders est []  - 0 Staff telephones HHA, Nursing Homes / Clarify orders / etc []  - 0 Routine Transfer to another Facility (non-emergent condition) []  - 0 Routine Hospital Admission (non-emergent condition) X- 1 15 New Admissions / Biomedical engineer / Ordering NPWT Apligraf, etc. , []  - 0 Emergency Hospital Admission (emergent condition) PROCESS - Special Needs []  - 0 Pediatric / Minor Patient Management []  - 0 Isolation Patient Management []  - 0 Hearing / Language / Visual special needs []  - 0 Assessment of Community assistance (transportation, D/C planning, etc.) []  - 0 Additional assistance / Altered mentation []  - 0 Support Surface(s) Assessment  (bed, cushion, seat, etc.) INTERVENTIONS - Miscellaneous []  - 0 External ear exam []  - 0 Patient Transfer (multiple staff / Civil Service fast streamer / Similar devices) []  - 0 Simple Staple / Suture removal (25 or less) []  - 0 Complex Staple / Suture removal (26 or more) []  - 0 Hypo/Hyperglycemic Management (do not check if billed separately) X- 1 15 Ankle / Brachial Index (ABI) - do not  check if billed separately Has the patient been seen at the hospital within the last three years: Yes Total Score: 110 Level Of Care: New/Established - Level 3 Electronic Signature(s) Signed: 09/29/2020 5:28:50 PM By: Rhae Hammock RN Entered By: Rhae Hammock on 09/28/2020 14:52:00 -------------------------------------------------------------------------------- Encounter Discharge Information Details Patient Name: Date of Service: Mary Cumins. 09/28/2020 1:15 PM Medical Record Number: [id number] Patient Account Number: [id number] Date of Birth/Sex: Treating RN: Mary Valencia (65 y.o. Female) Lorrin Jackson Primary Care Eola Waldrep: Cassandria Anger Other Clinician: Referring Tarus Briski: Treating Glena Pharris/Extender: Thayer Headings in Treatment: 0 Encounter Discharge Information Items Post Procedure Vitals Discharge Condition: Stable Temperature (F): 98.4 Ambulatory Status: Wheelchair Pulse (bpm): 81 Discharge Destination: Home Respiratory Rate (breaths/min): 18 Transportation: Private Auto Blood Pressure (mmHg): 183/69 Accompanied By: Son Schedule Follow-up Appointment: Yes Clinical Summary of Care: Provided on 09/28/2020 Form Type Recipient Paper Patient Patient Electronic Signature(s) Signed: 09/28/2020 3:09:05 PM By: Lorrin Jackson Entered By: Lorrin Jackson on 09/28/2020 15:09:05 -------------------------------------------------------------------------------- Lower Extremity Assessment Details Patient Name: Date of Service: Mary Cumins. 09/28/2020 1:15  PM Medical Record Number: [id number] Patient Account Number: [id number] Date of Birth/Sex: Treating RN: [birth year] (65 y.o. Female) Lorrin Jackson Primary Care Pat Sires: Cassandria Anger Other Clinician: Referring Lynnie Koehler: Treating Jacalyn Biggs/Extender: Thayer Headings in Treatment: 0 Edema Assessment Assessed: Shirlyn Goltz: Yes] [Right: No] Edema: [Left: N] [Right: o] Calf Left: Right: Point of Measurement: 29 cm From Medial Instep 30 cm Ankle Left: Right: Point of Measurement: 7 cm From Medial Instep 20 cm Vascular Assessment Pulses: Dorsalis Pedis Palpable: [Left:No Yes] Notes ABI= Non compressible Electronic Signature(s) Signed: 09/28/2020 5:08:59 PM By: Lorrin Jackson Entered By: Lorrin Jackson on 09/28/2020 14:07:44 -------------------------------------------------------------------------------- Multi Wound Chart Details Patient Name: Date of Service: Mary Cumins. 09/28/2020 1:15 PM Medical Record Number: [id number] Patient Account Number: [id number] Date of Birth/Sex: Treating RN: [birth year] (65 y.o. Female) Rhae Hammock Primary Care Thetis Schwimmer: Cassandria Anger Other Clinician: Referring Lloyd Cullinan: Treating Travonna Swindle/Extender: Thayer Headings in Treatment: 0 Vital Signs Height(in): 62 Capillary Blood Glucose(mg/dl): 230 Weight(lbs): 132 Pulse(bpm): 45 Body Mass Index(BMI): 24 Blood Pressure(mmHg): 183/69 Temperature(F): 98.4 Respiratory Rate(breaths/min): 18 Photos: [1:No Photos Left, Anterior Lower Leg] [N/A:N/A N/A] Wound Location: [1:Trauma] [N/A:N/A] Wounding Event: [1:Venous Leg Ulcer] [N/A:N/A] Primary Etiology: [1:Chronic sinus problems/congestion, N/A] Comorbid History: [1:Coronary Artery Disease, Type II Diabetes, End Stage Renal Disease 07/27/2020] [N/A:N/A] Date Acquired: [1:0] [N/A:N/A] Weeks of Treatment: [1:Open] [N/A:N/A] Wound Status: [1:0.9x0.9x0.1] [N/A:N/A] Measurements L x W x  D (cm) [1:0.636] [N/A:N/A] A (cm) : rea [1:0.064] [N/A:N/A] Volume (cm) : [1:Full Thickness Without Exposed] [N/A:N/A] Classification: [1:Support Structures Medium] [N/A:N/A] Exudate A mount: [1:Serosanguineous] [N/A:N/A] Exudate Type: [1:red, brown] [N/A:N/A] Exudate Color: [1:Distinct, outline attached] [N/A:N/A] Wound Margin: [1:Large (67-100%)] [N/A:N/A] Granulation A mount: [1:Red] [N/A:N/A] Granulation Quality: [1:None Present (0%)] [N/A:N/A] Necrotic A mount: [1:Fat Layer (Subcutaneous Tissue): Yes N/A] Exposed Structures: [1:Fascia: No Tendon: No Muscle: No Joint: No Bone: No None] [N/A:N/A] Epithelialization: [1:Debridement - Excisional] [N/A:N/A] Debridement: Pre-procedure Verification/Time Out 14:47 [N/A:N/A] Taken: [1:Lidocaine] [N/A:N/A] Pain Control: [1:Subcutaneous, Slough] [N/A:N/A] Tissue Debrided: [1:Skin/Subcutaneous Tissue] [N/A:N/A] Level: [1:0.81] [N/A:N/A] Debridement A (sq cm): [1:rea Curette] [N/A:N/A] Instrument: [1:Minimum] [N/A:N/A] Bleeding: [1:Pressure] [N/A:N/A] Hemostasis Achieved: [1:0] [N/A:N/A] Procedural Pain: [1:0] [N/A:N/A] Post Procedural Pain: [1:Procedure was tolerated well] [N/A:N/A] Debridement Treatment Response: [1:0.9x0.9x0.1] [N/A:N/A] Post Debridement Measurements L x W x D (cm) [1:0.064] [N/A:N/A] Post Debridement Volume: (cm) [1:Debridement] [N/A:N/A] Treatment Notes Electronic Signature(s) Signed: 09/28/2020 4:57:40 PM By: Dellia Nims,  Legrand Como MD Signed: 09/29/2020 5:28:50 PM By: Rhae Hammock RN Entered By: Linton Ham on 09/28/2020 14:57:24 -------------------------------------------------------------------------------- Multi-Disciplinary Care Plan Details Patient Name: Date of Service: Mary Cumins. 09/28/2020 1:15 PM Medical Record Number: [id number] Patient Account Number: [id number] Date of Birth/Sex: Treating RN: [birth year] (65 y.o. Female) Rhae Hammock Primary Care Gabryela Kimbrell: Cassandria Anger Other  Clinician: Referring Cassie Shedlock: Treating Etherine Mackowiak/Extender: Thayer Headings in Treatment: 0 Active Inactive Orientation to the Wound Care Program Nursing Diagnoses: Knowledge deficit related to the wound healing center program Goals: Patient/caregiver will verbalize understanding of the [place] Date Initiated: 09/28/2020 Target Resolution Date: 10/30/2020 Goal Status: Active Interventions: Provide education on orientation to the wound center Notes: Wound/Skin Impairment Nursing Diagnoses: Impaired tissue integrity Knowledge deficit related to smoking impact on wound healing Knowledge deficit related to ulceration/compromised skin integrity Goals: Patient will demonstrate a reduced rate of smoking or cessation of smoking Date Initiated: 09/28/2020 Target Resolution Date: 10/27/2020 Goal Status: Active Patient will have a decrease in wound volume by X% from date: (specify in notes) Date Initiated: 09/28/2020 Target Resolution Date: 10/27/2020 Goal Status: Active Patient/caregiver will verbalize understanding of skin care regimen Date Initiated: 09/28/2020 Target Resolution Date: 10/27/2020 Goal Status: Active Ulcer/skin breakdown will have a volume reduction of 30% by week 4 Date Initiated: 09/28/2020 Target Resolution Date: 10/27/2020 Goal Status: Active Interventions: Assess patient/caregiver ability to obtain necessary supplies Assess patient/caregiver ability to perform ulcer/skin care regimen upon admission and as needed Assess ulceration(s) every visit Provide education on smoking Notes: Electronic Signature(s) Signed: 09/29/2020 5:28:50 PM By: Rhae Hammock RN Entered By: Rhae Hammock on 09/28/2020 14:38:48 -------------------------------------------------------------------------------- Pain Assessment Details Patient Name: Date of Service: Mary Cumins. 09/28/2020 1:15 PM Medical Record Number: [id number] Patient Account  Number: [id number] Date of Birth/Sex: Treating RN: [birth year] (65 y.o. Female) Lorrin Jackson Primary Care Damond Borchers: Cassandria Anger Other Clinician: Referring Dontavia Brand: Treating Daimion Adamcik/Extender: Thayer Headings in Treatment: 0 Active Problems Location of Pain Severity and Description of Pain Patient Has Paino No Site Locations Pain Management and Medication Current Pain Management: Electronic Signature(s) Signed: 09/28/2020 5:08:59 PM By: Lorrin Jackson Entered By: Lorrin Jackson on 09/28/2020 14:06:37 -------------------------------------------------------------------------------- Patient/Caregiver Education Details Patient Name: Date of Service: Mary Cumins 4/5/2022andnbsp1:15 PM Medical Record Number: [id number] Patient Account Number: [id number] Date of Birth/Gender: Treating RN: [birth year] (65 y.o. Female) Rhae Hammock Primary Care Physician: Cassandria Anger Other Clinician: Referring Physician: Treating Physician/Extender: Thayer Headings in Treatment: 0 Education Assessment Education Provided To: Patient Education Topics Provided Smoking and Wound Healing: Methods: Explain/Verbal Responses: State content correctly Welcome T [facility] [place]: o Methods: Explain/Verbal Responses: State content correctly Electronic Signature(s) Signed: 09/29/2020 5:28:50 PM By: Rhae Hammock RN Entered By: Rhae Hammock on 09/28/2020 14:39:00 -------------------------------------------------------------------------------- Wound Assessment Details Patient Name: Date of Service: Mary Cumins. 09/28/2020 1:15 PM Medical Record Number: [id number] Patient Account Number: [id number] Date of Birth/Sex: Treating RN: [birth year] (65 y.o. Female) Lorrin Jackson Primary Care Shaune Malacara: Cassandria Anger Other Clinician: Referring Lowanda Cashaw: Treating Kesleigh Morson/Extender: Thayer Headings in Treatment: 0 Wound Status Wound Number: 1 Primary Venous Leg Ulcer Etiology: Wound Location: Left, Anterior Lower Leg Wound Open Wounding Event: Trauma Status: Date Acquired: 07/27/2020 Comorbid Chronic sinus problems/congestion, Coronary Artery Disease, Weeks Of Treatment: 0 History: Type II Diabetes, End Stage Renal Disease Clustered Wound: No Photos Wound Measurements Length: (cm) 0.9 Width: (cm) 0.9 Depth: (cm) 0.1 Area: (  cm) 0.636 Volume: (cm) 0.064 % Reduction in Area: 0% % Reduction in Volume: 0% Epithelialization: None Tunneling: No Undermining: No Wound Description Classification: Full Thickness Without Exposed Support Structures Wound Margin: Distinct, outline attached Exudate Amount: Medium Exudate Type: Serosanguineous Exudate Color: red, brown Wound Bed Granulation Amount: Large (67-100%) Granulation Quality: Red Necrotic Amount: None Present (0%) Foul Odor After Cleansing: No Slough/Fibrino No Exposed Structure Fascia Exposed: No Fat Layer (Subcutaneous Tissue) Exposed: Yes Tendon Exposed: No Muscle Exposed: No Joint Exposed: No Bone Exposed: No Treatment Notes Wound #1 (Lower Leg) Wound Laterality: Left, Anterior Cleanser Normal Saline Discharge Instruction: Cleanse the wound with Normal Saline prior to applying a clean dressing using gauze sponges, not tissue or cotton balls. Soap and Water Discharge Instruction: May shower and wash wound with dial antibacterial soap and water prior to dressing change. Peri-Wound Care Topical Primary Dressing Promogran Prisma Matrix, 4.34 (sq in) (silver collagen) Discharge Instruction: Moisten collagen with saline or hydrogel Secondary Dressing ComfortFoam Border, 3x3 in (silicone border) Discharge Instruction: Apply over primary dressing as directed. Secured With Compression Wrap Compression Stockings Environmental education officer) Signed: 09/28/2020 5:08:59 PM  By: Lorrin Jackson Signed: 09/30/2020 4:58:23 PM By: Sandre Kitty Entered By: Sandre Kitty on 09/28/2020 16:15:53 -------------------------------------------------------------------------------- Vitals Details Patient Name: Date of Service: Mary Cumins. 09/28/2020 1:15 PM Medical Record Number: [id number] Patient Account Number: [id number] Date of Birth/Sex: Treating RN: [birth year] (65 y.o. Female) Lorrin Jackson Primary Care Luretha Eberly: Cassandria Anger Other Clinician: Referring Darriel Sinquefield: Treating Glorianne Proctor/Extender: Thayer Headings in Treatment: 0 Vital Signs Time Taken: 13:45 Temperature (F): 98.4 Height (in): 62 Pulse (bpm): 81 Source: Stated Respiratory Rate (breaths/min): 18 Weight (lbs): 132 Blood Pressure (mmHg): 183/69 Source: Stated Capillary Blood Glucose (mg/dl): 230 Body Mass Index (BMI): 24.1 Reference Range: 80 - 120 mg / dl Electronic Signature(s) Signed: 09/28/2020 5:08:59 PM By: Lorrin Jackson Entered By: Lorrin Jackson on 09/28/2020 13:52:02

## 2020-10-01 DIAGNOSIS — N186 End stage renal disease: Secondary | ICD-10-CM | POA: Diagnosis not present

## 2020-10-01 DIAGNOSIS — D631 Anemia in chronic kidney disease: Secondary | ICD-10-CM | POA: Diagnosis not present

## 2020-10-01 DIAGNOSIS — N2581 Secondary hyperparathyroidism of renal origin: Secondary | ICD-10-CM | POA: Diagnosis not present

## 2020-10-01 DIAGNOSIS — D509 Iron deficiency anemia, unspecified: Secondary | ICD-10-CM | POA: Diagnosis not present

## 2020-10-02 DIAGNOSIS — N186 End stage renal disease: Secondary | ICD-10-CM | POA: Diagnosis not present

## 2020-10-02 DIAGNOSIS — D509 Iron deficiency anemia, unspecified: Secondary | ICD-10-CM | POA: Diagnosis not present

## 2020-10-02 DIAGNOSIS — N2581 Secondary hyperparathyroidism of renal origin: Secondary | ICD-10-CM | POA: Diagnosis not present

## 2020-10-02 DIAGNOSIS — D631 Anemia in chronic kidney disease: Secondary | ICD-10-CM | POA: Diagnosis not present

## 2020-10-03 DIAGNOSIS — N2581 Secondary hyperparathyroidism of renal origin: Secondary | ICD-10-CM | POA: Diagnosis not present

## 2020-10-03 DIAGNOSIS — D509 Iron deficiency anemia, unspecified: Secondary | ICD-10-CM | POA: Diagnosis not present

## 2020-10-03 DIAGNOSIS — N186 End stage renal disease: Secondary | ICD-10-CM | POA: Diagnosis not present

## 2020-10-03 DIAGNOSIS — D631 Anemia in chronic kidney disease: Secondary | ICD-10-CM | POA: Diagnosis not present

## 2020-10-04 DIAGNOSIS — N2581 Secondary hyperparathyroidism of renal origin: Secondary | ICD-10-CM | POA: Diagnosis not present

## 2020-10-04 DIAGNOSIS — D631 Anemia in chronic kidney disease: Secondary | ICD-10-CM | POA: Diagnosis not present

## 2020-10-04 DIAGNOSIS — D509 Iron deficiency anemia, unspecified: Secondary | ICD-10-CM | POA: Diagnosis not present

## 2020-10-04 DIAGNOSIS — N186 End stage renal disease: Secondary | ICD-10-CM | POA: Diagnosis not present

## 2020-10-05 ENCOUNTER — Encounter (HOSPITAL_BASED_OUTPATIENT_CLINIC_OR_DEPARTMENT_OTHER): Payer: Medicare HMO | Admitting: Internal Medicine

## 2020-10-05 DIAGNOSIS — D509 Iron deficiency anemia, unspecified: Secondary | ICD-10-CM | POA: Diagnosis not present

## 2020-10-05 DIAGNOSIS — D631 Anemia in chronic kidney disease: Secondary | ICD-10-CM | POA: Diagnosis not present

## 2020-10-05 DIAGNOSIS — N186 End stage renal disease: Secondary | ICD-10-CM | POA: Diagnosis not present

## 2020-10-05 DIAGNOSIS — N2581 Secondary hyperparathyroidism of renal origin: Secondary | ICD-10-CM | POA: Diagnosis not present

## 2020-10-06 DIAGNOSIS — N186 End stage renal disease: Secondary | ICD-10-CM | POA: Diagnosis not present

## 2020-10-06 DIAGNOSIS — N2581 Secondary hyperparathyroidism of renal origin: Secondary | ICD-10-CM | POA: Diagnosis not present

## 2020-10-06 DIAGNOSIS — D509 Iron deficiency anemia, unspecified: Secondary | ICD-10-CM | POA: Diagnosis not present

## 2020-10-06 DIAGNOSIS — D631 Anemia in chronic kidney disease: Secondary | ICD-10-CM | POA: Diagnosis not present

## 2020-10-07 DIAGNOSIS — D509 Iron deficiency anemia, unspecified: Secondary | ICD-10-CM | POA: Diagnosis not present

## 2020-10-07 DIAGNOSIS — N186 End stage renal disease: Secondary | ICD-10-CM | POA: Diagnosis not present

## 2020-10-07 DIAGNOSIS — N2581 Secondary hyperparathyroidism of renal origin: Secondary | ICD-10-CM | POA: Diagnosis not present

## 2020-10-07 DIAGNOSIS — D631 Anemia in chronic kidney disease: Secondary | ICD-10-CM | POA: Diagnosis not present

## 2020-10-08 DIAGNOSIS — N186 End stage renal disease: Secondary | ICD-10-CM | POA: Diagnosis not present

## 2020-10-08 DIAGNOSIS — N2581 Secondary hyperparathyroidism of renal origin: Secondary | ICD-10-CM | POA: Diagnosis not present

## 2020-10-08 DIAGNOSIS — D631 Anemia in chronic kidney disease: Secondary | ICD-10-CM | POA: Diagnosis not present

## 2020-10-08 DIAGNOSIS — D509 Iron deficiency anemia, unspecified: Secondary | ICD-10-CM | POA: Diagnosis not present

## 2020-10-09 DIAGNOSIS — N186 End stage renal disease: Secondary | ICD-10-CM | POA: Diagnosis not present

## 2020-10-09 DIAGNOSIS — N2581 Secondary hyperparathyroidism of renal origin: Secondary | ICD-10-CM | POA: Diagnosis not present

## 2020-10-09 DIAGNOSIS — D631 Anemia in chronic kidney disease: Secondary | ICD-10-CM | POA: Diagnosis not present

## 2020-10-09 DIAGNOSIS — D509 Iron deficiency anemia, unspecified: Secondary | ICD-10-CM | POA: Diagnosis not present

## 2020-10-10 DIAGNOSIS — N186 End stage renal disease: Secondary | ICD-10-CM | POA: Diagnosis not present

## 2020-10-10 DIAGNOSIS — D631 Anemia in chronic kidney disease: Secondary | ICD-10-CM | POA: Diagnosis not present

## 2020-10-10 DIAGNOSIS — N2581 Secondary hyperparathyroidism of renal origin: Secondary | ICD-10-CM | POA: Diagnosis not present

## 2020-10-10 DIAGNOSIS — D509 Iron deficiency anemia, unspecified: Secondary | ICD-10-CM | POA: Diagnosis not present

## 2020-10-11 ENCOUNTER — Encounter: Payer: Self-pay | Admitting: Internal Medicine

## 2020-10-11 ENCOUNTER — Ambulatory Visit (INDEPENDENT_AMBULATORY_CARE_PROVIDER_SITE_OTHER): Payer: Medicare HMO | Admitting: Internal Medicine

## 2020-10-11 ENCOUNTER — Other Ambulatory Visit: Payer: Self-pay

## 2020-10-11 DIAGNOSIS — F4321 Adjustment disorder with depressed mood: Secondary | ICD-10-CM

## 2020-10-11 DIAGNOSIS — N186 End stage renal disease: Secondary | ICD-10-CM

## 2020-10-11 DIAGNOSIS — R634 Abnormal weight loss: Secondary | ICD-10-CM

## 2020-10-11 DIAGNOSIS — I639 Cerebral infarction, unspecified: Secondary | ICD-10-CM | POA: Diagnosis not present

## 2020-10-11 DIAGNOSIS — M25511 Pain in right shoulder: Secondary | ICD-10-CM | POA: Insufficient documentation

## 2020-10-11 DIAGNOSIS — I2510 Atherosclerotic heart disease of native coronary artery without angina pectoris: Secondary | ICD-10-CM

## 2020-10-11 DIAGNOSIS — E785 Hyperlipidemia, unspecified: Secondary | ICD-10-CM | POA: Diagnosis not present

## 2020-10-11 DIAGNOSIS — N2581 Secondary hyperparathyroidism of renal origin: Secondary | ICD-10-CM | POA: Diagnosis not present

## 2020-10-11 DIAGNOSIS — T148XXA Other injury of unspecified body region, initial encounter: Secondary | ICD-10-CM | POA: Diagnosis not present

## 2020-10-11 DIAGNOSIS — D509 Iron deficiency anemia, unspecified: Secondary | ICD-10-CM | POA: Diagnosis not present

## 2020-10-11 DIAGNOSIS — D631 Anemia in chronic kidney disease: Secondary | ICD-10-CM | POA: Diagnosis not present

## 2020-10-11 DIAGNOSIS — G8929 Other chronic pain: Secondary | ICD-10-CM

## 2020-10-11 DIAGNOSIS — Z992 Dependence on renal dialysis: Secondary | ICD-10-CM | POA: Diagnosis not present

## 2020-10-11 NOTE — Progress Notes (Signed)
Subjective:  Patient ID: Mary Valencia, female    DOB: [birth year]  Age: 65 y.o. MRN: [id number]  CC: Follow-up (3 month f/u)   HPI MARIANGELA HELDT presents for DM, ESRD, HTN Walking better w/assistance C/o R shoulder - worsening stiffness  Outpatient Medications Prior to Visit  Medication Sig Dispense Refill  . Alcohol Swabs (B-D SINGLE USE SWABS REGULAR) PADS Use as directed to clean site to check blood sugars 180 each 3  . atorvastatin (LIPITOR) 40 MG tablet Take 1 tablet (40 mg total) by mouth daily. Follow-up appt due in Feb w/Lipid check must see provider for future refills 90 tablet 0  . Blood Glucose Monitoring Suppl (TRUE METRIX METER) w/Device KIT USE AS DIRECTED TO CHECK BLOOD SUGAR 1 kit 0  . calcium acetate, Phos Binder, (PHOSLYRA) 667 MG/5ML SOLN Take 667 mg by mouth 3 (three) times daily with meals.    . calcium carbonate (TUMS - DOSED IN MG ELEMENTAL CALCIUM) 500 MG chewable tablet     . CALCIUM-MAGNESIUM-VITAMIN D PO Take 1 tablet by mouth daily.    . Continuous Blood Gluc Receiver (FREESTYLE LIBRE 14 DAY READER) DEVI 1 Units by Does not apply route daily. 1 each 1  . Continuous Blood Gluc Sensor (FREESTYLE LIBRE 14 DAY SENSOR) MISC 1 Units by Does not apply route every 14 (fourteen) days. 6 each 3  . diphenhydrAMINE (BENADRYL) 25 mg capsule Take 25 mg by mouth as needed.    Marland Kitchen glucose blood (TRUE METRIX BLOOD GLUCOSE TEST) test strip Use to check blood sugars twice a day 180 each 3  . glucose blood test strip TEST BLOOD GLUCOSE THREE TIMES DAILY 300 each 3  . hydrALAZINE (APRESOLINE) 100 MG tablet Take 1 tablet (100 mg total) by mouth 3 (three) times daily. 270 tablet 3  . Insulin NPH Human, Isophane, (NOVOLIN N ) Inject 2 Doses into the skin. 5 mg in morning, 7 mg at night    . labetalol (NORMODYNE) 200 MG tablet Take 1 tablet (200 mg total) by mouth 3 (three) times daily. 90 tablet 3  . losartan (COZAAR) 50 MG tablet Take 1 tablet (50 mg total) by mouth daily.  (Patient taking differently: Take 50 mg by mouth in the morning and at bedtime.) 90 tablet 3  . mupirocin ointment (BACTROBAN) 2 % On leg wound w/dressing change qd or bid 30 g 1  . NIFEdipine (ADALAT CC) 60 MG 24 hr tablet     . NOVOLOG 100 UNIT/ML injection Inject 7 Units into the skin in the morning and at bedtime. 10 mL 11  . omeprazole (PRILOSEC) 40 MG capsule Take 1 capsule (40 mg total) by mouth daily. 90 capsule 3  . polyethylene glycol (MIRALAX / GLYCOLAX) packet Take 17 g by mouth.    Marland Kitchen PRESCRIPTION MEDICATION 210 mg 3 (three) times daily before meals.     . senna-docusate (SENOKOT-S) 8.6-50 MG tablet Take by mouth 2 (two) times daily. Take 2 tab twice daily.    Marland Kitchen talc powder Apply topically as needed. Under breasts 240 g 0  . torsemide (DEMADEX) 20 MG tablet Take 40 mg by mouth.     . TRUEplus Lancets 33G MISC Use to help check blood sugar twice a day 180 each 3  . calcitRIOL (ROCALTROL) 0.25 MCG capsule Take 0.25 mcg by mouth daily. (Patient not taking: Reported on 10/11/2020)    . cinacalcet (SENSIPAR) 30 MG tablet Take 30 mg by mouth daily. (Patient not taking: Reported on 10/11/2020)  No facility-administered medications prior to visit.    ROS: Review of Systems  Constitutional: Negative for activity change, appetite change, chills, fatigue and unexpected weight change.  HENT: Negative for congestion, mouth sores and sinus pressure.   Eyes: Negative for visual disturbance.  Respiratory: Negative for cough and chest tightness.   Gastrointestinal: Negative for abdominal pain and nausea.  Genitourinary: Negative for difficulty urinating, frequency and vaginal pain.  Musculoskeletal: Positive for arthralgias, gait problem and neck stiffness. Negative for back pain.  Skin: Negative for pallor and rash.  Neurological: Positive for weakness. Negative for dizziness, tremors, numbness and headaches.  Psychiatric/Behavioral: Negative for confusion and sleep disturbance.     Objective:  BP 130/62 (BP Location: Left Arm)   Pulse 83   Temp 97.8 F (36.6 C) (Oral)   Ht _0  (1.575 m)   Wt 120 lb 3.2 oz (54.5 kg)   SpO2 95%   BMI 21.98 kg/m   BP Readings from Last 3 Encounters:  10/11/20 130/62  08/23/20 98/62  07/21/20 (!) 134/50    Wt Readings from Last 3 Encounters:  10/11/20 120 lb 3.2 oz (54.5 kg)  08/23/20 126 lb 3.2 oz (57.2 kg)  07/21/20 130 lb 14.4 oz (59.4 kg)    Physical Exam Constitutional:      General: She is not in acute distress.    Appearance: She is well-developed.  HENT:     Head: Normocephalic.     Right Ear: External ear normal.     Left Ear: External ear normal.     Nose: Nose normal.  Eyes:     General:        Right eye: No discharge.        Left eye: No discharge.     Conjunctiva/sclera: Conjunctivae normal.     Pupils: Pupils are equal, round, and reactive to light.  Neck:     Thyroid: No thyromegaly.     Vascular: No JVD.     Trachea: No tracheal deviation.  Cardiovascular:     Rate and Rhythm: Normal rate and regular rhythm.     Heart sounds: Normal heart sounds.  Pulmonary:     Effort: No respiratory distress.     Breath sounds: No stridor. No wheezing.  Abdominal:     General: Bowel sounds are normal. There is no distension.     Palpations: Abdomen is soft. There is no mass.     Tenderness: There is no abdominal tenderness. There is no guarding or rebound.  Musculoskeletal:        General: Tenderness present.     Cervical back: Normal range of motion and neck supple.  Lymphadenopathy:     Cervical: No cervical adenopathy.  Skin:    Findings: No erythema or rash.  Neurological:     Mental Status: She is oriented to person, place, and time.     Cranial Nerves: No cranial nerve deficit.     Motor: No abnormal muscle tone.     Coordination: Coordination abnormal.     Gait: Gait abnormal.     Deep Tendon Reflexes: Reflexes normal.  Psychiatric:        Behavior: Behavior normal.        Thought  Content: Thought content normal.        Judgment: Judgment normal.     Weak R side; in a w/c R shoulder - painful, stiff  Lab Results  Component Value Date   WBC 7.7 08/29/2017   HGB 29.1 09/01/2017  HCT 30 (A) 08/29/2017   PLT 203 08/29/2017   GLUCOSE 279 (H) 09/21/2017   CHOL 161 09/21/2017   TRIG 72.0 09/21/2017   HDL 63.60 09/21/2017   LDLDIRECT 207.8 09/29/2010   LDLCALC 83 09/21/2017   ALT 6 09/21/2017   AST 21 09/21/2017   NA 141 09/21/2017   K 4.9 09/21/2017   CL 98 09/21/2017   CREATININE 13.49 (HH) 09/21/2017   BUN 66 (H) 09/21/2017   CO2 27 09/21/2017   TSH 2.81 09/21/2017   HGBA1C 7.1 (H) 09/21/2017    No results found.  Assessment & Plan:    Walker Kehr, MD

## 2020-10-11 NOTE — Assessment & Plan Note (Signed)
On Lipitor 

## 2020-10-11 NOTE — Assessment & Plan Note (Addendum)
Gait is worse Start home PT [facility]

## 2020-10-11 NOTE — Assessment & Plan Note (Addendum)
On PD daily  Labs monthly w/Nephrology

## 2020-10-11 NOTE — Assessment & Plan Note (Signed)
F/u w/Wound Clinic for LLE distal shin post-hematoma  wound

## 2020-10-11 NOTE — Assessment & Plan Note (Signed)
Start home PT [facility]

## 2020-10-11 NOTE — Assessment & Plan Note (Signed)
Wt Readings from Last 3 Encounters:  10/11/20 120 lb 3.2 oz (54.5 kg)  08/23/20 126 lb 3.2 oz (57.2 kg)  07/21/20 130 lb 14.4 oz (59.4 kg)

## 2020-10-12 ENCOUNTER — Encounter (HOSPITAL_BASED_OUTPATIENT_CLINIC_OR_DEPARTMENT_OTHER): Payer: Medicare HMO | Admitting: Internal Medicine

## 2020-10-12 DIAGNOSIS — Z794 Long term (current) use of insulin: Secondary | ICD-10-CM | POA: Diagnosis not present

## 2020-10-12 DIAGNOSIS — E11622 Type 2 diabetes mellitus with other skin ulcer: Secondary | ICD-10-CM

## 2020-10-12 DIAGNOSIS — L97818 Non-pressure chronic ulcer of other part of right lower leg with other specified severity: Secondary | ICD-10-CM

## 2020-10-12 DIAGNOSIS — E1122 Type 2 diabetes mellitus with diabetic chronic kidney disease: Secondary | ICD-10-CM | POA: Diagnosis not present

## 2020-10-12 DIAGNOSIS — Z992 Dependence on renal dialysis: Secondary | ICD-10-CM | POA: Diagnosis not present

## 2020-10-12 DIAGNOSIS — D631 Anemia in chronic kidney disease: Secondary | ICD-10-CM | POA: Diagnosis not present

## 2020-10-12 DIAGNOSIS — D509 Iron deficiency anemia, unspecified: Secondary | ICD-10-CM | POA: Diagnosis not present

## 2020-10-12 DIAGNOSIS — F4321 Adjustment disorder with depressed mood: Secondary | ICD-10-CM | POA: Insufficient documentation

## 2020-10-12 DIAGNOSIS — S80812D Abrasion, left lower leg, subsequent encounter: Secondary | ICD-10-CM | POA: Diagnosis not present

## 2020-10-12 DIAGNOSIS — I120 Hypertensive chronic kidney disease with stage 5 chronic kidney disease or end stage renal disease: Secondary | ICD-10-CM | POA: Diagnosis not present

## 2020-10-12 DIAGNOSIS — I69951 Hemiplegia and hemiparesis following unspecified cerebrovascular disease affecting right dominant side: Secondary | ICD-10-CM | POA: Diagnosis not present

## 2020-10-12 DIAGNOSIS — N2581 Secondary hyperparathyroidism of renal origin: Secondary | ICD-10-CM | POA: Diagnosis not present

## 2020-10-12 DIAGNOSIS — N186 End stage renal disease: Secondary | ICD-10-CM | POA: Diagnosis not present

## 2020-10-12 NOTE — Assessment & Plan Note (Signed)
Planing to move in with her brother - I agree; husband died of COVID in 2023/04/12; sister and dtr died earlier Discussed.

## 2020-10-13 DIAGNOSIS — N2581 Secondary hyperparathyroidism of renal origin: Secondary | ICD-10-CM | POA: Diagnosis not present

## 2020-10-13 DIAGNOSIS — D509 Iron deficiency anemia, unspecified: Secondary | ICD-10-CM | POA: Diagnosis not present

## 2020-10-13 DIAGNOSIS — D631 Anemia in chronic kidney disease: Secondary | ICD-10-CM | POA: Diagnosis not present

## 2020-10-13 DIAGNOSIS — N186 End stage renal disease: Secondary | ICD-10-CM | POA: Diagnosis not present

## 2020-10-14 DIAGNOSIS — N2581 Secondary hyperparathyroidism of renal origin: Secondary | ICD-10-CM | POA: Diagnosis not present

## 2020-10-14 DIAGNOSIS — E1142 Type 2 diabetes mellitus with diabetic polyneuropathy: Secondary | ICD-10-CM | POA: Diagnosis not present

## 2020-10-14 DIAGNOSIS — I120 Hypertensive chronic kidney disease with stage 5 chronic kidney disease or end stage renal disease: Secondary | ICD-10-CM | POA: Diagnosis not present

## 2020-10-14 DIAGNOSIS — D631 Anemia in chronic kidney disease: Secondary | ICD-10-CM | POA: Diagnosis not present

## 2020-10-14 DIAGNOSIS — I2510 Atherosclerotic heart disease of native coronary artery without angina pectoris: Secondary | ICD-10-CM | POA: Diagnosis not present

## 2020-10-14 DIAGNOSIS — N186 End stage renal disease: Secondary | ICD-10-CM | POA: Diagnosis not present

## 2020-10-14 DIAGNOSIS — E1122 Type 2 diabetes mellitus with diabetic chronic kidney disease: Secondary | ICD-10-CM | POA: Diagnosis not present

## 2020-10-14 DIAGNOSIS — G8929 Other chronic pain: Secondary | ICD-10-CM | POA: Diagnosis not present

## 2020-10-14 DIAGNOSIS — D509 Iron deficiency anemia, unspecified: Secondary | ICD-10-CM | POA: Diagnosis not present

## 2020-10-14 DIAGNOSIS — M25611 Stiffness of right shoulder, not elsewhere classified: Secondary | ICD-10-CM | POA: Diagnosis not present

## 2020-10-14 DIAGNOSIS — I69351 Hemiplegia and hemiparesis following cerebral infarction affecting right dominant side: Secondary | ICD-10-CM | POA: Diagnosis not present

## 2020-10-15 DIAGNOSIS — D631 Anemia in chronic kidney disease: Secondary | ICD-10-CM | POA: Diagnosis not present

## 2020-10-15 DIAGNOSIS — N2581 Secondary hyperparathyroidism of renal origin: Secondary | ICD-10-CM | POA: Diagnosis not present

## 2020-10-15 DIAGNOSIS — D509 Iron deficiency anemia, unspecified: Secondary | ICD-10-CM | POA: Diagnosis not present

## 2020-10-15 DIAGNOSIS — N186 End stage renal disease: Secondary | ICD-10-CM | POA: Diagnosis not present

## 2020-10-16 DIAGNOSIS — D509 Iron deficiency anemia, unspecified: Secondary | ICD-10-CM | POA: Diagnosis not present

## 2020-10-16 DIAGNOSIS — D631 Anemia in chronic kidney disease: Secondary | ICD-10-CM | POA: Diagnosis not present

## 2020-10-16 DIAGNOSIS — N186 End stage renal disease: Secondary | ICD-10-CM | POA: Diagnosis not present

## 2020-10-16 DIAGNOSIS — N2581 Secondary hyperparathyroidism of renal origin: Secondary | ICD-10-CM | POA: Diagnosis not present

## 2020-10-17 DIAGNOSIS — N186 End stage renal disease: Secondary | ICD-10-CM | POA: Diagnosis not present

## 2020-10-17 DIAGNOSIS — N2581 Secondary hyperparathyroidism of renal origin: Secondary | ICD-10-CM | POA: Diagnosis not present

## 2020-10-17 DIAGNOSIS — D631 Anemia in chronic kidney disease: Secondary | ICD-10-CM | POA: Diagnosis not present

## 2020-10-17 DIAGNOSIS — D509 Iron deficiency anemia, unspecified: Secondary | ICD-10-CM | POA: Diagnosis not present

## 2020-10-18 DIAGNOSIS — N186 End stage renal disease: Secondary | ICD-10-CM | POA: Diagnosis not present

## 2020-10-18 DIAGNOSIS — D631 Anemia in chronic kidney disease: Secondary | ICD-10-CM | POA: Diagnosis not present

## 2020-10-18 DIAGNOSIS — D509 Iron deficiency anemia, unspecified: Secondary | ICD-10-CM | POA: Diagnosis not present

## 2020-10-18 DIAGNOSIS — N2581 Secondary hyperparathyroidism of renal origin: Secondary | ICD-10-CM | POA: Diagnosis not present

## 2020-10-19 DIAGNOSIS — N186 End stage renal disease: Secondary | ICD-10-CM | POA: Diagnosis not present

## 2020-10-19 DIAGNOSIS — D631 Anemia in chronic kidney disease: Secondary | ICD-10-CM | POA: Diagnosis not present

## 2020-10-19 DIAGNOSIS — D509 Iron deficiency anemia, unspecified: Secondary | ICD-10-CM | POA: Diagnosis not present

## 2020-10-19 DIAGNOSIS — N2581 Secondary hyperparathyroidism of renal origin: Secondary | ICD-10-CM | POA: Diagnosis not present

## 2020-10-20 DIAGNOSIS — D631 Anemia in chronic kidney disease: Secondary | ICD-10-CM | POA: Diagnosis not present

## 2020-10-20 DIAGNOSIS — N2581 Secondary hyperparathyroidism of renal origin: Secondary | ICD-10-CM | POA: Diagnosis not present

## 2020-10-20 DIAGNOSIS — D509 Iron deficiency anemia, unspecified: Secondary | ICD-10-CM | POA: Diagnosis not present

## 2020-10-20 DIAGNOSIS — N186 End stage renal disease: Secondary | ICD-10-CM | POA: Diagnosis not present

## 2020-10-21 DIAGNOSIS — G8929 Other chronic pain: Secondary | ICD-10-CM | POA: Diagnosis not present

## 2020-10-21 DIAGNOSIS — D631 Anemia in chronic kidney disease: Secondary | ICD-10-CM | POA: Diagnosis not present

## 2020-10-21 DIAGNOSIS — N2581 Secondary hyperparathyroidism of renal origin: Secondary | ICD-10-CM | POA: Diagnosis not present

## 2020-10-21 DIAGNOSIS — I69351 Hemiplegia and hemiparesis following cerebral infarction affecting right dominant side: Secondary | ICD-10-CM | POA: Diagnosis not present

## 2020-10-21 DIAGNOSIS — I120 Hypertensive chronic kidney disease with stage 5 chronic kidney disease or end stage renal disease: Secondary | ICD-10-CM | POA: Diagnosis not present

## 2020-10-21 DIAGNOSIS — D509 Iron deficiency anemia, unspecified: Secondary | ICD-10-CM | POA: Diagnosis not present

## 2020-10-21 DIAGNOSIS — E1122 Type 2 diabetes mellitus with diabetic chronic kidney disease: Secondary | ICD-10-CM | POA: Diagnosis not present

## 2020-10-21 DIAGNOSIS — N186 End stage renal disease: Secondary | ICD-10-CM | POA: Diagnosis not present

## 2020-10-21 DIAGNOSIS — E1142 Type 2 diabetes mellitus with diabetic polyneuropathy: Secondary | ICD-10-CM | POA: Diagnosis not present

## 2020-10-21 DIAGNOSIS — I2510 Atherosclerotic heart disease of native coronary artery without angina pectoris: Secondary | ICD-10-CM | POA: Diagnosis not present

## 2020-10-21 DIAGNOSIS — M25611 Stiffness of right shoulder, not elsewhere classified: Secondary | ICD-10-CM | POA: Diagnosis not present

## 2020-10-22 DIAGNOSIS — N186 End stage renal disease: Secondary | ICD-10-CM | POA: Diagnosis not present

## 2020-10-22 DIAGNOSIS — N2581 Secondary hyperparathyroidism of renal origin: Secondary | ICD-10-CM | POA: Diagnosis not present

## 2020-10-22 DIAGNOSIS — D509 Iron deficiency anemia, unspecified: Secondary | ICD-10-CM | POA: Diagnosis not present

## 2020-10-22 DIAGNOSIS — D631 Anemia in chronic kidney disease: Secondary | ICD-10-CM | POA: Diagnosis not present

## 2020-10-23 DIAGNOSIS — Z992 Dependence on renal dialysis: Secondary | ICD-10-CM | POA: Diagnosis not present

## 2020-10-23 DIAGNOSIS — N2581 Secondary hyperparathyroidism of renal origin: Secondary | ICD-10-CM | POA: Diagnosis not present

## 2020-10-23 DIAGNOSIS — N186 End stage renal disease: Secondary | ICD-10-CM | POA: Diagnosis not present

## 2020-10-23 DIAGNOSIS — D631 Anemia in chronic kidney disease: Secondary | ICD-10-CM | POA: Diagnosis not present

## 2020-10-23 DIAGNOSIS — D509 Iron deficiency anemia, unspecified: Secondary | ICD-10-CM | POA: Diagnosis not present

## 2020-10-24 DIAGNOSIS — D631 Anemia in chronic kidney disease: Secondary | ICD-10-CM | POA: Diagnosis not present

## 2020-10-24 DIAGNOSIS — N186 End stage renal disease: Secondary | ICD-10-CM | POA: Diagnosis not present

## 2020-10-24 DIAGNOSIS — N2581 Secondary hyperparathyroidism of renal origin: Secondary | ICD-10-CM | POA: Diagnosis not present

## 2020-10-25 DIAGNOSIS — D631 Anemia in chronic kidney disease: Secondary | ICD-10-CM | POA: Diagnosis not present

## 2020-10-25 DIAGNOSIS — N186 End stage renal disease: Secondary | ICD-10-CM | POA: Diagnosis not present

## 2020-10-25 DIAGNOSIS — N2581 Secondary hyperparathyroidism of renal origin: Secondary | ICD-10-CM | POA: Diagnosis not present

## 2020-10-26 ENCOUNTER — Telehealth: Payer: Self-pay | Admitting: Internal Medicine

## 2020-10-26 DIAGNOSIS — D631 Anemia in chronic kidney disease: Secondary | ICD-10-CM | POA: Diagnosis not present

## 2020-10-26 DIAGNOSIS — N186 End stage renal disease: Secondary | ICD-10-CM | POA: Diagnosis not present

## 2020-10-26 DIAGNOSIS — N2581 Secondary hyperparathyroidism of renal origin: Secondary | ICD-10-CM | POA: Diagnosis not present

## 2020-10-26 NOTE — Telephone Encounter (Signed)
    Patient's  Son calling states " page 2 of form was blank" He doesn't know the name of the form, he just states its for helping patient with placement or home health  Son Jenny Reichmann can be reached at [phone]

## 2020-10-27 DIAGNOSIS — D631 Anemia in chronic kidney disease: Secondary | ICD-10-CM | POA: Diagnosis not present

## 2020-10-27 DIAGNOSIS — E119 Type 2 diabetes mellitus without complications: Secondary | ICD-10-CM | POA: Diagnosis not present

## 2020-10-27 DIAGNOSIS — N186 End stage renal disease: Secondary | ICD-10-CM | POA: Diagnosis not present

## 2020-10-27 DIAGNOSIS — N2581 Secondary hyperparathyroidism of renal origin: Secondary | ICD-10-CM | POA: Diagnosis not present

## 2020-10-28 DIAGNOSIS — G8929 Other chronic pain: Secondary | ICD-10-CM | POA: Diagnosis not present

## 2020-10-28 DIAGNOSIS — E1142 Type 2 diabetes mellitus with diabetic polyneuropathy: Secondary | ICD-10-CM | POA: Diagnosis not present

## 2020-10-28 DIAGNOSIS — N2581 Secondary hyperparathyroidism of renal origin: Secondary | ICD-10-CM | POA: Diagnosis not present

## 2020-10-28 DIAGNOSIS — I69351 Hemiplegia and hemiparesis following cerebral infarction affecting right dominant side: Secondary | ICD-10-CM | POA: Diagnosis not present

## 2020-10-28 DIAGNOSIS — E1122 Type 2 diabetes mellitus with diabetic chronic kidney disease: Secondary | ICD-10-CM | POA: Diagnosis not present

## 2020-10-28 DIAGNOSIS — N186 End stage renal disease: Secondary | ICD-10-CM | POA: Diagnosis not present

## 2020-10-28 DIAGNOSIS — D631 Anemia in chronic kidney disease: Secondary | ICD-10-CM | POA: Diagnosis not present

## 2020-10-28 DIAGNOSIS — M25611 Stiffness of right shoulder, not elsewhere classified: Secondary | ICD-10-CM | POA: Diagnosis not present

## 2020-10-28 DIAGNOSIS — I120 Hypertensive chronic kidney disease with stage 5 chronic kidney disease or end stage renal disease: Secondary | ICD-10-CM | POA: Diagnosis not present

## 2020-10-28 DIAGNOSIS — I2510 Atherosclerotic heart disease of native coronary artery without angina pectoris: Secondary | ICD-10-CM | POA: Diagnosis not present

## 2020-10-28 NOTE — Telephone Encounter (Signed)
Patients son called back and said that the name of the form is [id number].Son requesting a call back at [phone]

## 2020-10-28 NOTE — Telephone Encounter (Signed)
Called pt son Jenny Reichmann) back. Verified what actually need to be filled out. Per report Md filled out his portion, but he did miss the demographics, and his NPI #. Also completed page 3 faxed back to # on form.Marland KitchenJohny Chess

## 2020-10-29 DIAGNOSIS — I120 Hypertensive chronic kidney disease with stage 5 chronic kidney disease or end stage renal disease: Secondary | ICD-10-CM | POA: Diagnosis not present

## 2020-10-29 DIAGNOSIS — E1122 Type 2 diabetes mellitus with diabetic chronic kidney disease: Secondary | ICD-10-CM | POA: Diagnosis not present

## 2020-10-29 DIAGNOSIS — E1142 Type 2 diabetes mellitus with diabetic polyneuropathy: Secondary | ICD-10-CM | POA: Diagnosis not present

## 2020-10-29 DIAGNOSIS — D631 Anemia in chronic kidney disease: Secondary | ICD-10-CM | POA: Diagnosis not present

## 2020-10-29 DIAGNOSIS — M25611 Stiffness of right shoulder, not elsewhere classified: Secondary | ICD-10-CM | POA: Diagnosis not present

## 2020-10-29 DIAGNOSIS — I69351 Hemiplegia and hemiparesis following cerebral infarction affecting right dominant side: Secondary | ICD-10-CM | POA: Diagnosis not present

## 2020-10-29 DIAGNOSIS — I2510 Atherosclerotic heart disease of native coronary artery without angina pectoris: Secondary | ICD-10-CM | POA: Diagnosis not present

## 2020-10-29 DIAGNOSIS — G8929 Other chronic pain: Secondary | ICD-10-CM | POA: Diagnosis not present

## 2020-10-29 DIAGNOSIS — N2581 Secondary hyperparathyroidism of renal origin: Secondary | ICD-10-CM | POA: Diagnosis not present

## 2020-10-29 DIAGNOSIS — N186 End stage renal disease: Secondary | ICD-10-CM | POA: Diagnosis not present

## 2020-10-29 NOTE — Progress Notes (Signed)
Mary Valencia ([id number]) Visit Report for 10/12/2020 Arrival Information Details Patient Name: Date of Service: Mary Valencia, Mary Valencia. 10/12/2020 11:00 A M Medical Record Number: [id number] Patient Account Number: [id number] Date of Birth/Sex: Treating RN: 03-22-1956 (65 y.o. Mary Valencia, [name] Primary Care Treyshawn Muldrew: Cassandria Anger Other Clinician: Referring Jereld Presti: Treating Caspar Favila/Extender: Greig Right in Treatment: 2 Visit Information History Since Last Visit Added or deleted any medications: No Patient Arrived: Wheel Chair Any new allergies or adverse reactions: No Arrival Time: 10:52 Had a fall or experienced change in No Accompanied By: self activities of daily living that may affect Transfer Assistance: None risk of falls: Patient Identification Verified: Yes Signs or symptoms of abuse/neglect since last visito No Secondary Verification Process Completed: Yes Hospitalized since last visit: No Patient Requires Transmission-Based Precautions: No Implantable device outside of the clinic excluding No Patient Has Alerts: Yes cellular tissue based products placed in the center Patient Alerts: Patient on Blood Thinner since last visit: L ABI=Non comp Has Dressing in Place as Prescribed: Yes Pain Present Now: No Electronic Signature(s) Signed: 10/12/2020 10:57:26 AM By: Sandre Kitty Entered By: Sandre Kitty on 10/12/2020 10:53:00 -------------------------------------------------------------------------------- Clinic Level of Care Assessment Details Patient Name: Date of Service: Mary Valencia. 10/12/2020 11:00 A M Medical Record Number: [id number] Patient Account Number: [id number] Date of Birth/Sex: Treating RN: 05/25/1956 (65 y.o. Mary Valencia, [name] Primary Care Natascha Edmonds: Cassandria Anger Other Clinician: Referring Sahaj Bona: Treating Joene Gelder/Extender: Greig Right in Treatment:  2 Clinic Level of Care Assessment Items TOOL 4 Quantity Score X- 1 0 Use when only an EandM is performed on FOLLOW-UP visit ASSESSMENTS - Nursing Assessment / Reassessment X- 1 10 Reassessment of Co-morbidities (includes updates in patient status) X- 1 5 Reassessment of Adherence to Treatment Plan ASSESSMENTS - Wound and Skin A ssessment / Reassessment X - Simple Wound Assessment / Reassessment - one wound 1 5 []  - 0 Complex Wound Assessment / Reassessment - multiple wounds X- 1 10 Dermatologic / Skin Assessment (not related to wound area) ASSESSMENTS - Focused Assessment X- 1 5 Circumferential Edema Measurements - multi extremities X- 1 10 Nutritional Assessment / Counseling / Intervention X- 1 5 Lower Extremity Assessment (monofilament, tuning fork, pulses) []  - 0 Peripheral Arterial Disease Assessment (using hand held doppler) ASSESSMENTS - Ostomy and/or Continence Assessment and Care []  - 0 Incontinence Assessment and Management []  - 0 Ostomy Care Assessment and Management (repouching, etc.) PROCESS - Coordination of Care X - Simple Patient / Family Education for ongoing care 1 15 []  - 0 Complex (extensive) Patient / Family Education for ongoing care []  - 0 Staff obtains Programmer, systems, Records, T Results / Process Orders est []  - 0 Staff telephones HHA, Nursing Homes / Clarify orders / etc []  - 0 Routine Transfer to another Facility (non-emergent condition) []  - 0 Routine Hospital Admission (non-emergent condition) []  - 0 New Admissions / Biomedical engineer / Ordering NPWT Apligraf, etc. , []  - 0 Emergency Hospital Admission (emergent condition) X- 1 10 Simple Discharge Coordination []  - 0 Complex (extensive) Discharge Coordination PROCESS - Special Needs []  - 0 Pediatric / Minor Patient Management []  - 0 Isolation Patient Management []  - 0 Hearing / Language / Visual special needs []  - 0 Assessment of Community assistance (transportation, D/C  planning, etc.) []  - 0 Additional assistance / Altered mentation []  - 0 Support Surface(s) Assessment (bed, cushion, seat, etc.) INTERVENTIONS - Wound Cleansing / Measurement X - Simple Wound Cleansing -  one wound 1 5 []  - 0 Complex Wound Cleansing - multiple wounds X- 1 5 Wound Imaging (photographs - any number of wounds) []  - 0 Wound Tracing (instead of photographs) X- 1 5 Simple Wound Measurement - one wound []  - 0 Complex Wound Measurement - multiple wounds INTERVENTIONS - Wound Dressings X - Small Wound Dressing one or multiple wounds 1 10 []  - 0 Medium Wound Dressing one or multiple wounds []  - 0 Large Wound Dressing one or multiple wounds []  - 0 Application of Medications - topical []  - 0 Application of Medications - injection INTERVENTIONS - Miscellaneous []  - 0 External ear exam []  - 0 Specimen Collection (cultures, biopsies, blood, body fluids, etc.) []  - 0 Specimen(s) / Culture(s) sent or taken to Lab for analysis []  - 0 Patient Transfer (multiple staff / Civil Service fast streamer / Similar devices) []  - 0 Simple Staple / Suture removal (25 or less) []  - 0 Complex Staple / Suture removal (26 or more) []  - 0 Hypo / Hyperglycemic Management (close monitor of Blood Glucose) []  - 0 Ankle / Brachial Index (ABI) - do not check if billed separately X- 1 5 Vital Signs Has the patient been seen at the hospital within the last three years: Yes Total Score: 105 Level Of Care: New/Established - Level 3 Electronic Signature(s) Signed: 10/29/2020 3:14:58 PM By: Rhae Hammock RN Entered By: Rhae Hammock on 10/12/2020 11:59:04 -------------------------------------------------------------------------------- Lower Extremity Assessment Details Patient Name: Date of Service: Mary Valencia. 10/12/2020 11:00 A M Medical Record Number: [id number] Patient Account Number: [id number] Date of Birth/Sex: Treating RN: 08-31-1955 (65 y.o. Elam Dutch Primary Care Maxyne Derocher:  Cassandria Anger Other Clinician: Referring Coby Shrewsberry: Treating Armend Hochstatter/Extender: Greig Right in Treatment: 2 Edema Assessment Assessed: [Left: No] [Right: No] Edema: [Left: N] [Right: o] Calf Left: Right: Point of Measurement: 29 cm From Medial Instep 30 cm Ankle Left: Right: Point of Measurement: 7 cm From Medial Instep 20 cm Electronic Signature(s) Signed: 10/13/2020 6:20:01 PM By: Baruch Gouty RN, BSN Entered By: Baruch Gouty on 10/12/2020 11:00:37 -------------------------------------------------------------------------------- Multi Wound Chart Details Patient Name: Date of Service: Mary Valencia. 10/12/2020 11:00 A M Medical Record Number: [id number] Patient Account Number: [id number] Date of Birth/Sex: Treating RN: 08/04/1955 (65 y.o. Mary Valencia, [name] Primary Care Ewart Carrera: Cassandria Anger Other Clinician: Referring Valori Hollenkamp: Treating Daveion Robar/Extender: Greig Right in Treatment: 2 Vital Signs Height(in): 62 Capillary Blood Glucose(mg/dl): 269 Weight(lbs): 132 Pulse(bpm): 81 Body Mass Index(BMI): 24 Blood Pressure(mmHg): 152/45 Temperature(F): 98.0 Respiratory Rate(breaths/min): 18 Photos: [1:No Photos Left, Anterior Lower Leg] [N/A:N/A N/A] Wound Location: [1:Trauma] [N/A:N/A] Wounding Event: [1:Venous Leg Ulcer] [N/A:N/A] Primary Etiology: [1:07/27/2020] [N/A:N/A] Date Acquired: [1:2] [N/A:N/A] Weeks of Treatment: [1:Open] [N/A:N/A] Wound Status: [1:0x0x0] [N/A:N/A] Measurements L x W x D (cm) [1:0] [N/A:N/A] A (cm) : rea [1:0] [N/A:N/A] Volume (cm) : [1:100.00%] [N/A:N/A] % Reduction in A rea: [1:100.00%] [N/A:N/A] % Reduction in Volume: [1:Full Thickness Without Exposed] [N/A:N/A] Classification: [1:Support Structures] Treatment Notes Electronic Signature(s) Signed: 10/12/2020 12:43:59 PM By: Kalman Shan DO Signed: 10/29/2020 3:14:58 PM By: Rhae Hammock  RN Entered By: Kalman Shan on 10/12/2020 12:33:32 -------------------------------------------------------------------------------- Multi-Disciplinary Care Plan Details Patient Name: Date of Service: Mary Valencia. 10/12/2020 11:00 A M Medical Record Number: [id number] Patient Account Number: [id number] Date of Birth/Sex: Treating RN: [birth year] (65 y.o. Mary Valencia, [name] Primary Care Arin Vanosdol: Cassandria Anger Other Clinician: Referring Kervin Bones: Treating Jacy Brocker/Extender: Greig Right in Treatment: 2  Active Inactive Electronic Signature(s) Signed: 10/29/2020 3:14:58 PM By: Rhae Hammock RN Entered By: Rhae Hammock on 10/12/2020 11:20:41 -------------------------------------------------------------------------------- Pain Assessment Details Patient Name: Date of Service: Mary Valencia. 10/12/2020 11:00 A M Medical Record Number: [id number] Patient Account Number: [id number] Date of Birth/Sex: Treating RN: April 01, 1956 (65 y.o. Mary Valencia, [name] Primary Care Casper Pagliuca: Cassandria Anger Other Clinician: Referring Kekai Geter: Treating Dalyla Chui/Extender: Greig Right in Treatment: 2 Active Problems Location of Pain Severity and Description of Pain Patient Has Paino No Site Locations Rate the pain. Rate the pain. Current Pain Level: 0 Pain Management and Medication Current Pain Management: Electronic Signature(s) Signed: 10/13/2020 6:20:01 PM By: Baruch Gouty RN, BSN Signed: 10/29/2020 3:14:58 PM By: Rhae Hammock RN Previous Signature: 10/12/2020 10:57:26 AM Version By: Sandre Kitty Entered By: Baruch Gouty on 10/12/2020 11:00:22 -------------------------------------------------------------------------------- Patient/Caregiver Education Details Patient Name: Date of Service: Mary Valencia, Mary M. 4/19/2022andnbsp11:00 [place] Record Number: [id number] Patient Account  Number: [id number] Date of Birth/Gender: Treating RN: 14-Nov-1955 (65 y.o. Mary Valencia, [name] Primary Care Physician: Cassandria Anger Other Clinician: Referring Physician: Treating Physician/Extender: Greig Right in Treatment: 2 Education Assessment Education Provided To: Patient Education Topics Provided Wound/Skin Impairment: Methods: Explain/Verbal Responses: State content correctly Electronic Signature(s) Signed: 10/29/2020 3:14:58 PM By: Rhae Hammock RN Entered By: Rhae Hammock on 10/12/2020 11:21:14 -------------------------------------------------------------------------------- Wound Assessment Details Patient Name: Date of Service: Mary Valencia. 10/12/2020 11:00 A M Medical Record Number: [id number] Patient Account Number: [id number] Date of Birth/Sex: Treating RN: 31-Oct-1955 (65 y.o. Mary Valencia, [name] Primary Care Kairee Isa: Cassandria Anger Other Clinician: Referring Leasha Goldberger: Treating Haley Fuerstenberg/Extender: Greig Right in Treatment: 2 Wound Status Wound Number: 1 Primary Venous Leg Ulcer Etiology: Wound Location: Left, Anterior Lower Leg Wound Healed - Epithelialized Wounding Event: Trauma Status: Date Acquired: 07/27/2020 Comorbid Chronic sinus problems/congestion, Coronary Artery Disease, Weeks Of Treatment: 2 History: Type II Diabetes, End Stage Renal Disease Clustered Wound: No Photos Wound Measurements Length: (cm) Width: (cm) Depth: (cm) Area: (cm) Volume: (cm) 0 % Reduction in Area: 100% 0 % Reduction in Volume: 100% 0 Epithelialization: None 0 0 Wound Description Classification: Full Thickness Without Exposed Support Structures Wound Margin: Distinct, outline attached Exudate Amount: Medium Exudate Type: Serosanguineous Exudate Color: red, brown Foul Odor After Cleansing: No Slough/Fibrino No Wound Bed Granulation Amount: Large (67-100%) Exposed  Structure Granulation Quality: Red Fascia Exposed: No Necrotic Amount: None Present (0%) Fat Layer (Subcutaneous Tissue) Exposed: Yes Tendon Exposed: No Muscle Exposed: No Joint Exposed: No Bone Exposed: No Electronic Signature(s) Signed: 10/13/2020 10:33:47 AM By: Sandre Kitty Signed: 10/29/2020 3:14:58 PM By: Rhae Hammock RN Previous Signature: 10/12/2020 10:57:26 AM Version By: Sandre Kitty Entered By: Sandre Kitty on 10/12/2020 16:36:41 -------------------------------------------------------------------------------- [place] Details Patient Name: Date of Service: Mary Valencia. 10/12/2020 11:00 A M Medical Record Number: [id number] Patient Account Number: [id number] Date of Birth/Sex: Treating RN: [birth year] (65 y.o. Mary Valencia, [name] Primary Care Gisel Vipond: Cassandria Anger Other Clinician: Referring Chaney Ingram: Treating Dorothyann Mourer/Extender: Greig Right in Treatment: 2 Vital Signs Time Taken: 10:53 Temperature (F): 98.0 Height (in): 62 Pulse (bpm): 81 Weight (lbs): 132 Respiratory Rate (breaths/min): 18 Body Mass Index (BMI): 24.1 Blood Pressure (mmHg): 152/45 Capillary Blood Glucose (mg/dl): 269 Reference Range: 80 - 120 mg / dl Electronic Signature(s) Signed: 10/12/2020 10:57:26 AM By: Sandre Kitty Entered By: Sandre Kitty on 10/12/2020 10:53:32

## 2020-10-29 NOTE — Progress Notes (Signed)
KENOSHA, DOSTER ([id number]) Visit Report for 10/12/2020 Chief Complaint Document Details Patient Name: Date of Service: TYLASIA, FLETCHALL. 10/12/2020 11:00 A M Medical Record Number: [id number] Patient Account Number: [id number] Date of Birth/Sex: Treating RN: Jan 11, 1956 (65 y.o. Tonita Phoenix, [name] Primary Care Provider: Cassandria Anger Other Clinician: Referring Provider: Treating Provider/Extender: Greig Right in Treatment: 2 Information Obtained from: Patient Chief Complaint 09/28/2020; patient is here for review of wound on the left anterior mid tibia area initially starting as a traumatic wound Electronic Signature(s) Signed: 10/12/2020 12:43:59 PM By: Kalman Shan DO Entered By: Kalman Shan on 10/12/2020 12:33:42 -------------------------------------------------------------------------------- HPI Details Patient Name: Date of Service: Verda Cumins. 10/12/2020 11:00 A M Medical Record Number: [id number] Patient Account Number: [id number] Date of Birth/Sex: Treating RN: 1955-09-29 (65 y.o. Tonita Phoenix, [name] Primary Care Provider: Cassandria Anger Other Clinician: Referring Provider: Treating Provider/Extender: Greig Right in Treatment: 2 History of Present Illness HPI Description: ADMISSION 09/28/2020 This is a 65 year old woman who is a type II diabetic. She tells [place] that in early February she was using her motorized wheelchair at home and it inadvertently put it into gear moving forward and she traumatized her left anterior leg. This is left her with a nonhealing wound in this area. She has not been specifically dressing this at all including no covering dressing. She came into the clinic with a eschar in place this was removed and cleaning the wound surface it looks reasonably clean after this. 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  She is apparently lost a considerably amount of weight although this was intended. Also mentioned his cirrhosis of the liver I do not have any additional information on this. ABI in our clinic was noncompressible on the low 4/19; patient presents today for a 2-week follow-up. She had a wound to the anterior left leg that was caused by trauma. She has been using silver collagen every other day with dressing changes. T oday the wound is closed. Electronic Signature(s) Signed: 10/12/2020 12:43:59 PM By: Kalman Shan DO Entered By: Kalman Shan on 10/12/2020 12:36:12 -------------------------------------------------------------------------------- Physical Exam Details Patient Name: Date of Service: Verda Cumins. 10/12/2020 11:00 A M Medical Record Number: [id number] Patient Account Number: [id number] Date of Birth/Sex: Treating RN: 09-Jan-1956 (65 y.o. Benjaman Lobe Primary Care Provider: Cassandria Anger Other Clinician: Referring Provider: Treating Provider/Extender: Greig Right in Treatment: 2 Cardiovascular 2+ dorsalis pedis/posterior tibialis pulses. Psychiatric pleasant and cooperative. Notes Left lower extremity: Previous wound site is epithelialized. Electronic Signature(s) Signed: 10/12/2020 12:43:59 PM By: Kalman Shan DO Entered By: Kalman Shan on 10/12/2020 12:37:09 -------------------------------------------------------------------------------- Physician Orders Details Patient Name: Date of Service: Verda Cumins. 10/12/2020 11:00 A M Medical Record Number: [id number] Patient Account Number: [id number] Date of Birth/Sex: Treating RN: 06/15/1956 (65 y.o. Tonita Phoenix, [name] Primary Care Provider: Cassandria Anger Other Clinician: Referring Provider: Treating Provider/Extender: Greig Right in Treatment: 2 Verbal / Phone Orders: No Diagnosis Coding ICD-10 Coding Code  Description E11.622 Type 2 diabetes mellitus with other skin ulcer S80.812D Abrasion, left lower leg, subsequent encounter L97.818 Non-pressure chronic ulcer of other part of right lower leg with other specified severity Follow-up Appointments Other: - No need to follow up Discharge From [facility] Discharge from [place] Signature(s) Signed: 10/12/2020 12:43:59 PM By: Kalman Shan DO Entered By: Kalman Shan on  10/12/2020 12:40:24 -------------------------------------------------------------------------------- Problem List Details Patient Name: Date of Service: CHAUNCEY, BRUNO. 10/12/2020 11:00 A M Medical Record Number: [id number] Patient Account Number: [id number] Date of Birth/Sex: Treating RN: 1956-04-30 (65 y.o. Tonita Phoenix, [name] Primary Care Provider: Cassandria Anger Other Clinician: Referring Provider: Treating Provider/Extender: Greig Right in Treatment: 2 Active Problems ICD-10 Encounter Code Description Active Date MDM Diagnosis E11.622 Type 2 diabetes mellitus with other skin ulcer 09/28/2020 No Yes S80.812D Abrasion, left lower leg, subsequent encounter 09/28/2020 No Yes L97.818 Non-pressure chronic ulcer of other part of right lower leg with other specified 09/28/2020 No Yes severity Inactive Problems Resolved Problems Electronic Signature(s) Signed: 10/12/2020 12:43:59 PM By: Kalman Shan DO Entered By: Kalman Shan on 10/12/2020 12:33:25 -------------------------------------------------------------------------------- Progress Note Details Patient Name: Date of Service: Verda Cumins. 10/12/2020 11:00 A M Medical Record Number: [id number] Patient Account Number: [id number] Date of Birth/Sex: Treating RN: [birth year] (64 y.o. Tonita Phoenix, [name] Primary Care Provider: Cassandria Anger Other Clinician: Referring Provider: Treating Provider/Extender: Greig Right in Treatment: 2 Subjective Chief Complaint Information obtained from Patient 09/28/2020; patient is here for review of wound on the left anterior mid tibia area initially starting as a traumatic wound History of Present Illness (HPI) ADMISSION 09/28/2020 This is a 65 year old woman who is a type II diabetic. She tells [place] that in early February she was using her motorized wheelchair at home and it inadvertently put it into gear moving forward and she traumatized her left anterior leg. This is left her with a nonhealing wound in this area. She has not been specifically dressing this at all including no covering dressing. She came into the clinic with a eschar in place this was removed and cleaning the wound surface it looks reasonably clean after this. 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 She is apparently lost a considerably amount of weight although this was intended. Also mentioned his cirrhosis of the liver I do not have any additional information on this. ABI in our clinic was noncompressible on the low 4/19; patient presents today for a 2-week follow-up. She had a wound to the anterior left leg that was caused by trauma. She has been using silver collagen every other day with dressing changes. T oday the wound is closed. Patient History Information obtained from Patient. Family History Cancer - Siblings, Diabetes - Mother, Heart Disease - Father, Hypertension - Father, Kidney Disease - Mother, Lung Disease - Mother, Tuberculosis - Mother, No family history of Hereditary Spherocytosis, Seizures, Stroke, Thyroid Problems. Social History Never smoker, Marital Status - Widowed, Alcohol Use - Never, Drug Use - No History, Caffeine Use - Daily. Medical History Ear/Nose/Mouth/Throat Patient has history of Chronic sinus problems/congestion Cardiovascular Patient has history of Coronary Artery Disease Endocrine Patient  has history of Type II Diabetes Genitourinary Patient has history of End Stage Renal Disease - Peritoneal Dialysis Medical A Surgical History Notes nd Cardiovascular TIA-CVA w/right hemiparesis Objective Constitutional Vitals Time Taken: 10:53 AM, Height: 62 in, Weight: 132 lbs, BMI: 24.1, Temperature: 98.0 F, Pulse: 81 bpm, Respiratory Rate: 18 breaths/min, Blood Pressure: 152/45 mmHg, Capillary Blood Glucose: 269 mg/dl. Cardiovascular 2+ dorsalis pedis/posterior tibialis pulses. Psychiatric pleasant and cooperative. General Notes: Left lower extremity: Previous wound site is epithelialized. Integumentary (Hair, Skin) Wound #1 status is Open. Original cause of wound was Trauma. The date acquired was: 07/27/2020. The wound has been in treatment 2 weeks. The  wound is located on the Left,Anterior Lower Leg. The wound measures 0cm length x 0cm width x 0cm depth; 0cm^2 area and 0cm^3 volume. Assessment Active Problems ICD-10 Type 2 diabetes mellitus with other skin ulcer Abrasion, left lower leg, subsequent encounter Non-pressure chronic ulcer of other part of right lower leg with other specified severity Patient was first evaluated in our clinic on 4/5 for traumatic wound to her anterior left leg. This has healed nicely over the past 2 weeks with the use of collagen every other day. T oday the previous wound site is epithelialized with no signs of infection. I recommended she continue to keep this covered for the next week to avoid it from opening. She was told to call us with any questions or concerns and can follow-up as needed. Plan Follow-up Appointments: Other: - No need to follow up Discharge From [facility] Services: Discharge from [place] has healed and patient has been discharged Electronic Signature(s) Signed: 10/12/2020 12:43:59 PM By: Kalman Shan DO Entered By: Kalman Shan on 10/12/2020  12:42:19 -------------------------------------------------------------------------------- HxROS Details Patient Name: Date of Service: Verda Cumins. 10/12/2020 11:00 A M Medical Record Number: [id number] Patient Account Number: [id number] Date of Birth/Sex: Treating RN: 1956/02/18 (65 y.o. Tonita Phoenix, [name] Primary Care Provider: Cassandria Anger Other Clinician: Referring Provider: Treating Provider/Extender: Greig Right in Treatment: 2 Information Obtained From Patient Ear/Nose/Mouth/Throat Medical History: Positive for: Chronic sinus problems/congestion Cardiovascular Medical History: Positive for: Coronary Artery Disease Past Medical History Notes: TIA-CVA w/right hemiparesis Endocrine Medical History: Positive for: Type II Diabetes Treated with: Insulin Blood sugar tested every day: Yes Tested : 2x day Genitourinary Medical History: Positive for: End Stage Renal Disease - Peritoneal Dialysis HBO Extended History Items Ear/Nose/Mouth/Throat: Chronic sinus problems/congestion Immunizations Pneumococcal Vaccine: Received Pneumococcal Vaccination: Yes Implantable Devices Yes Family and Social History Cancer: Yes - Siblings; Diabetes: Yes - Mother; Heart Disease: Yes - Father; Hereditary Spherocytosis: No; Hypertension: Yes - Father; Kidney Disease: Yes - Mother; Lung Disease: Yes - Mother; Seizures: No; Stroke: No; Thyroid Problems: No; Tuberculosis: Yes - Mother; Never smoker; Marital Status - Widowed; Alcohol Use: Never; Drug Use: No History; Caffeine Use: Daily; Financial Concerns: No; Food, Clothing or Shelter Needs: No; Support System Lacking: No; Transportation Concerns: No Electronic Signature(s) Signed: 10/12/2020 12:43:59 PM By: Kalman Shan DO Signed: 10/29/2020 3:14:58 PM By: Rhae Hammock RN Entered By: Kalman Shan on 10/12/2020  12:36:19 -------------------------------------------------------------------------------- SuperBill Details Patient Name: Date of Service: Verda Cumins. 10/12/2020 Medical Record Number: [id number] Patient Account Number: [id number] Date of Birth/Sex: Treating RN: 10/11/1955 (65 y.o. Tonita Phoenix, [name] Primary Care Provider: Cassandria Anger Other Clinician: Referring Provider: Treating Provider/Extender: Greig Right in Treatment: 2 Diagnosis Coding ICD-10 Codes Code Description [phone] Type 2 diabetes mellitus with other skin ulcer S80.812D Abrasion, left lower leg, subsequent encounter L97.818 Non-pressure chronic ulcer of other part of right lower leg with other specified severity Facility Procedures CPT4 Code: [id number] Description: 84696 - WOUND CARE VISIT-LEV 3 EST PT Modifier: Quantity: 1 Electronic Signature(s) Signed: 10/12/2020 12:43:59 PM By: Kalman Shan DO Entered By: Kalman Shan on 10/12/2020 12:43:02

## 2020-10-30 DIAGNOSIS — D631 Anemia in chronic kidney disease: Secondary | ICD-10-CM | POA: Diagnosis not present

## 2020-10-30 DIAGNOSIS — N2581 Secondary hyperparathyroidism of renal origin: Secondary | ICD-10-CM | POA: Diagnosis not present

## 2020-10-30 DIAGNOSIS — N186 End stage renal disease: Secondary | ICD-10-CM | POA: Diagnosis not present

## 2020-10-31 DIAGNOSIS — N2581 Secondary hyperparathyroidism of renal origin: Secondary | ICD-10-CM | POA: Diagnosis not present

## 2020-10-31 DIAGNOSIS — D631 Anemia in chronic kidney disease: Secondary | ICD-10-CM | POA: Diagnosis not present

## 2020-10-31 DIAGNOSIS — N186 End stage renal disease: Secondary | ICD-10-CM | POA: Diagnosis not present

## 2020-11-01 DIAGNOSIS — D631 Anemia in chronic kidney disease: Secondary | ICD-10-CM | POA: Diagnosis not present

## 2020-11-01 DIAGNOSIS — N2581 Secondary hyperparathyroidism of renal origin: Secondary | ICD-10-CM | POA: Diagnosis not present

## 2020-11-01 DIAGNOSIS — N186 End stage renal disease: Secondary | ICD-10-CM | POA: Diagnosis not present

## 2020-11-02 DIAGNOSIS — N186 End stage renal disease: Secondary | ICD-10-CM | POA: Diagnosis not present

## 2020-11-02 DIAGNOSIS — D631 Anemia in chronic kidney disease: Secondary | ICD-10-CM | POA: Diagnosis not present

## 2020-11-02 DIAGNOSIS — N2581 Secondary hyperparathyroidism of renal origin: Secondary | ICD-10-CM | POA: Diagnosis not present

## 2020-11-03 DIAGNOSIS — I120 Hypertensive chronic kidney disease with stage 5 chronic kidney disease or end stage renal disease: Secondary | ICD-10-CM | POA: Diagnosis not present

## 2020-11-03 DIAGNOSIS — G8929 Other chronic pain: Secondary | ICD-10-CM | POA: Diagnosis not present

## 2020-11-03 DIAGNOSIS — D631 Anemia in chronic kidney disease: Secondary | ICD-10-CM | POA: Diagnosis not present

## 2020-11-03 DIAGNOSIS — I2510 Atherosclerotic heart disease of native coronary artery without angina pectoris: Secondary | ICD-10-CM | POA: Diagnosis not present

## 2020-11-03 DIAGNOSIS — N186 End stage renal disease: Secondary | ICD-10-CM | POA: Diagnosis not present

## 2020-11-03 DIAGNOSIS — E1142 Type 2 diabetes mellitus with diabetic polyneuropathy: Secondary | ICD-10-CM | POA: Diagnosis not present

## 2020-11-03 DIAGNOSIS — I69351 Hemiplegia and hemiparesis following cerebral infarction affecting right dominant side: Secondary | ICD-10-CM | POA: Diagnosis not present

## 2020-11-03 DIAGNOSIS — M25611 Stiffness of right shoulder, not elsewhere classified: Secondary | ICD-10-CM | POA: Diagnosis not present

## 2020-11-03 DIAGNOSIS — E1122 Type 2 diabetes mellitus with diabetic chronic kidney disease: Secondary | ICD-10-CM | POA: Diagnosis not present

## 2020-11-03 DIAGNOSIS — N2581 Secondary hyperparathyroidism of renal origin: Secondary | ICD-10-CM | POA: Diagnosis not present

## 2020-11-04 DIAGNOSIS — N186 End stage renal disease: Secondary | ICD-10-CM | POA: Diagnosis not present

## 2020-11-04 DIAGNOSIS — N2581 Secondary hyperparathyroidism of renal origin: Secondary | ICD-10-CM | POA: Diagnosis not present

## 2020-11-04 DIAGNOSIS — D631 Anemia in chronic kidney disease: Secondary | ICD-10-CM | POA: Diagnosis not present

## 2020-11-05 ENCOUNTER — Other Ambulatory Visit: Payer: Self-pay | Admitting: Internal Medicine

## 2020-11-05 DIAGNOSIS — E1142 Type 2 diabetes mellitus with diabetic polyneuropathy: Secondary | ICD-10-CM | POA: Diagnosis not present

## 2020-11-05 DIAGNOSIS — G8929 Other chronic pain: Secondary | ICD-10-CM | POA: Diagnosis not present

## 2020-11-05 DIAGNOSIS — I2510 Atherosclerotic heart disease of native coronary artery without angina pectoris: Secondary | ICD-10-CM | POA: Diagnosis not present

## 2020-11-05 DIAGNOSIS — E1122 Type 2 diabetes mellitus with diabetic chronic kidney disease: Secondary | ICD-10-CM | POA: Diagnosis not present

## 2020-11-05 DIAGNOSIS — N186 End stage renal disease: Secondary | ICD-10-CM | POA: Diagnosis not present

## 2020-11-05 DIAGNOSIS — N2581 Secondary hyperparathyroidism of renal origin: Secondary | ICD-10-CM | POA: Diagnosis not present

## 2020-11-05 DIAGNOSIS — D631 Anemia in chronic kidney disease: Secondary | ICD-10-CM | POA: Diagnosis not present

## 2020-11-05 DIAGNOSIS — I120 Hypertensive chronic kidney disease with stage 5 chronic kidney disease or end stage renal disease: Secondary | ICD-10-CM | POA: Diagnosis not present

## 2020-11-05 DIAGNOSIS — M25611 Stiffness of right shoulder, not elsewhere classified: Secondary | ICD-10-CM | POA: Diagnosis not present

## 2020-11-05 DIAGNOSIS — I69351 Hemiplegia and hemiparesis following cerebral infarction affecting right dominant side: Secondary | ICD-10-CM | POA: Diagnosis not present

## 2020-11-06 DIAGNOSIS — D631 Anemia in chronic kidney disease: Secondary | ICD-10-CM | POA: Diagnosis not present

## 2020-11-06 DIAGNOSIS — N186 End stage renal disease: Secondary | ICD-10-CM | POA: Diagnosis not present

## 2020-11-06 DIAGNOSIS — N2581 Secondary hyperparathyroidism of renal origin: Secondary | ICD-10-CM | POA: Diagnosis not present

## 2020-11-07 DIAGNOSIS — N186 End stage renal disease: Secondary | ICD-10-CM | POA: Diagnosis not present

## 2020-11-07 DIAGNOSIS — D631 Anemia in chronic kidney disease: Secondary | ICD-10-CM | POA: Diagnosis not present

## 2020-11-07 DIAGNOSIS — N2581 Secondary hyperparathyroidism of renal origin: Secondary | ICD-10-CM | POA: Diagnosis not present

## 2020-11-08 DIAGNOSIS — D631 Anemia in chronic kidney disease: Secondary | ICD-10-CM | POA: Diagnosis not present

## 2020-11-08 DIAGNOSIS — N186 End stage renal disease: Secondary | ICD-10-CM | POA: Diagnosis not present

## 2020-11-08 DIAGNOSIS — N2581 Secondary hyperparathyroidism of renal origin: Secondary | ICD-10-CM | POA: Diagnosis not present

## 2020-11-09 DIAGNOSIS — I2510 Atherosclerotic heart disease of native coronary artery without angina pectoris: Secondary | ICD-10-CM | POA: Diagnosis not present

## 2020-11-09 DIAGNOSIS — G8929 Other chronic pain: Secondary | ICD-10-CM | POA: Diagnosis not present

## 2020-11-09 DIAGNOSIS — E1122 Type 2 diabetes mellitus with diabetic chronic kidney disease: Secondary | ICD-10-CM | POA: Diagnosis not present

## 2020-11-09 DIAGNOSIS — E1142 Type 2 diabetes mellitus with diabetic polyneuropathy: Secondary | ICD-10-CM | POA: Diagnosis not present

## 2020-11-09 DIAGNOSIS — D631 Anemia in chronic kidney disease: Secondary | ICD-10-CM | POA: Diagnosis not present

## 2020-11-09 DIAGNOSIS — M25611 Stiffness of right shoulder, not elsewhere classified: Secondary | ICD-10-CM | POA: Diagnosis not present

## 2020-11-09 DIAGNOSIS — I120 Hypertensive chronic kidney disease with stage 5 chronic kidney disease or end stage renal disease: Secondary | ICD-10-CM | POA: Diagnosis not present

## 2020-11-09 DIAGNOSIS — N2581 Secondary hyperparathyroidism of renal origin: Secondary | ICD-10-CM | POA: Diagnosis not present

## 2020-11-09 DIAGNOSIS — N186 End stage renal disease: Secondary | ICD-10-CM | POA: Diagnosis not present

## 2020-11-09 DIAGNOSIS — I69351 Hemiplegia and hemiparesis following cerebral infarction affecting right dominant side: Secondary | ICD-10-CM | POA: Diagnosis not present

## 2020-11-10 DIAGNOSIS — D631 Anemia in chronic kidney disease: Secondary | ICD-10-CM | POA: Diagnosis not present

## 2020-11-10 DIAGNOSIS — N186 End stage renal disease: Secondary | ICD-10-CM | POA: Diagnosis not present

## 2020-11-10 DIAGNOSIS — N2581 Secondary hyperparathyroidism of renal origin: Secondary | ICD-10-CM | POA: Diagnosis not present

## 2020-11-11 DIAGNOSIS — N186 End stage renal disease: Secondary | ICD-10-CM | POA: Diagnosis not present

## 2020-11-11 DIAGNOSIS — D631 Anemia in chronic kidney disease: Secondary | ICD-10-CM | POA: Diagnosis not present

## 2020-11-11 DIAGNOSIS — N2581 Secondary hyperparathyroidism of renal origin: Secondary | ICD-10-CM | POA: Diagnosis not present

## 2020-11-12 ENCOUNTER — Telehealth: Payer: Self-pay | Admitting: Internal Medicine

## 2020-11-12 DIAGNOSIS — E1122 Type 2 diabetes mellitus with diabetic chronic kidney disease: Secondary | ICD-10-CM | POA: Diagnosis not present

## 2020-11-12 DIAGNOSIS — M25611 Stiffness of right shoulder, not elsewhere classified: Secondary | ICD-10-CM | POA: Diagnosis not present

## 2020-11-12 DIAGNOSIS — Z1159 Encounter for screening for other viral diseases: Secondary | ICD-10-CM | POA: Diagnosis not present

## 2020-11-12 DIAGNOSIS — N2581 Secondary hyperparathyroidism of renal origin: Secondary | ICD-10-CM | POA: Diagnosis not present

## 2020-11-12 DIAGNOSIS — G8929 Other chronic pain: Secondary | ICD-10-CM | POA: Diagnosis not present

## 2020-11-12 DIAGNOSIS — Z114 Encounter for screening for human immunodeficiency virus [HIV]: Secondary | ICD-10-CM | POA: Diagnosis not present

## 2020-11-12 DIAGNOSIS — D631 Anemia in chronic kidney disease: Secondary | ICD-10-CM | POA: Diagnosis not present

## 2020-11-12 DIAGNOSIS — E1142 Type 2 diabetes mellitus with diabetic polyneuropathy: Secondary | ICD-10-CM | POA: Diagnosis not present

## 2020-11-12 DIAGNOSIS — I120 Hypertensive chronic kidney disease with stage 5 chronic kidney disease or end stage renal disease: Secondary | ICD-10-CM | POA: Diagnosis not present

## 2020-11-12 DIAGNOSIS — N186 End stage renal disease: Secondary | ICD-10-CM | POA: Diagnosis not present

## 2020-11-12 DIAGNOSIS — I69351 Hemiplegia and hemiparesis following cerebral infarction affecting right dominant side: Secondary | ICD-10-CM | POA: Diagnosis not present

## 2020-11-12 DIAGNOSIS — I2510 Atherosclerotic heart disease of native coronary artery without angina pectoris: Secondary | ICD-10-CM | POA: Diagnosis not present

## 2020-11-12 NOTE — Telephone Encounter (Signed)
   Patient would like to know if she can prepare syringe  with Insulin NPH Human, Isophane, (NOVOLIN N [place]) ahead of use and store until needed.   Please call patient

## 2020-11-12 NOTE — Telephone Encounter (Signed)
OK - up to 1 month in the refrigirator Thx

## 2020-11-13 DIAGNOSIS — N186 End stage renal disease: Secondary | ICD-10-CM | POA: Diagnosis not present

## 2020-11-13 DIAGNOSIS — D631 Anemia in chronic kidney disease: Secondary | ICD-10-CM | POA: Diagnosis not present

## 2020-11-13 DIAGNOSIS — N2581 Secondary hyperparathyroidism of renal origin: Secondary | ICD-10-CM | POA: Diagnosis not present

## 2020-11-14 DIAGNOSIS — N2581 Secondary hyperparathyroidism of renal origin: Secondary | ICD-10-CM | POA: Diagnosis not present

## 2020-11-14 DIAGNOSIS — D631 Anemia in chronic kidney disease: Secondary | ICD-10-CM | POA: Diagnosis not present

## 2020-11-14 DIAGNOSIS — N186 End stage renal disease: Secondary | ICD-10-CM | POA: Diagnosis not present

## 2020-11-15 DIAGNOSIS — N2581 Secondary hyperparathyroidism of renal origin: Secondary | ICD-10-CM | POA: Diagnosis not present

## 2020-11-15 DIAGNOSIS — D631 Anemia in chronic kidney disease: Secondary | ICD-10-CM | POA: Diagnosis not present

## 2020-11-15 DIAGNOSIS — N186 End stage renal disease: Secondary | ICD-10-CM | POA: Diagnosis not present

## 2020-11-15 NOTE — Telephone Encounter (Signed)
Notified pt w/MD response.../lmb 

## 2020-11-16 DIAGNOSIS — N186 End stage renal disease: Secondary | ICD-10-CM | POA: Diagnosis not present

## 2020-11-16 DIAGNOSIS — N2581 Secondary hyperparathyroidism of renal origin: Secondary | ICD-10-CM | POA: Diagnosis not present

## 2020-11-16 DIAGNOSIS — D631 Anemia in chronic kidney disease: Secondary | ICD-10-CM | POA: Diagnosis not present

## 2020-11-17 DIAGNOSIS — D631 Anemia in chronic kidney disease: Secondary | ICD-10-CM | POA: Diagnosis not present

## 2020-11-17 DIAGNOSIS — N186 End stage renal disease: Secondary | ICD-10-CM | POA: Diagnosis not present

## 2020-11-17 DIAGNOSIS — N2581 Secondary hyperparathyroidism of renal origin: Secondary | ICD-10-CM | POA: Diagnosis not present

## 2020-11-18 DIAGNOSIS — I69351 Hemiplegia and hemiparesis following cerebral infarction affecting right dominant side: Secondary | ICD-10-CM | POA: Diagnosis not present

## 2020-11-18 DIAGNOSIS — E1142 Type 2 diabetes mellitus with diabetic polyneuropathy: Secondary | ICD-10-CM | POA: Diagnosis not present

## 2020-11-18 DIAGNOSIS — N186 End stage renal disease: Secondary | ICD-10-CM | POA: Diagnosis not present

## 2020-11-18 DIAGNOSIS — D631 Anemia in chronic kidney disease: Secondary | ICD-10-CM | POA: Diagnosis not present

## 2020-11-18 DIAGNOSIS — I120 Hypertensive chronic kidney disease with stage 5 chronic kidney disease or end stage renal disease: Secondary | ICD-10-CM | POA: Diagnosis not present

## 2020-11-18 DIAGNOSIS — E1122 Type 2 diabetes mellitus with diabetic chronic kidney disease: Secondary | ICD-10-CM | POA: Diagnosis not present

## 2020-11-18 DIAGNOSIS — M25611 Stiffness of right shoulder, not elsewhere classified: Secondary | ICD-10-CM | POA: Diagnosis not present

## 2020-11-18 DIAGNOSIS — G8929 Other chronic pain: Secondary | ICD-10-CM | POA: Diagnosis not present

## 2020-11-18 DIAGNOSIS — N2581 Secondary hyperparathyroidism of renal origin: Secondary | ICD-10-CM | POA: Diagnosis not present

## 2020-11-18 DIAGNOSIS — I2510 Atherosclerotic heart disease of native coronary artery without angina pectoris: Secondary | ICD-10-CM | POA: Diagnosis not present

## 2020-11-19 ENCOUNTER — Other Ambulatory Visit: Payer: Self-pay | Admitting: Internal Medicine

## 2020-11-19 DIAGNOSIS — D631 Anemia in chronic kidney disease: Secondary | ICD-10-CM | POA: Diagnosis not present

## 2020-11-19 DIAGNOSIS — N2581 Secondary hyperparathyroidism of renal origin: Secondary | ICD-10-CM | POA: Diagnosis not present

## 2020-11-19 DIAGNOSIS — N186 End stage renal disease: Secondary | ICD-10-CM | POA: Diagnosis not present

## 2020-11-19 DIAGNOSIS — Z139 Encounter for screening, unspecified: Secondary | ICD-10-CM

## 2020-11-20 DIAGNOSIS — N2581 Secondary hyperparathyroidism of renal origin: Secondary | ICD-10-CM | POA: Diagnosis not present

## 2020-11-20 DIAGNOSIS — D631 Anemia in chronic kidney disease: Secondary | ICD-10-CM | POA: Diagnosis not present

## 2020-11-20 DIAGNOSIS — N186 End stage renal disease: Secondary | ICD-10-CM | POA: Diagnosis not present

## 2020-11-21 DIAGNOSIS — N2581 Secondary hyperparathyroidism of renal origin: Secondary | ICD-10-CM | POA: Diagnosis not present

## 2020-11-21 DIAGNOSIS — D631 Anemia in chronic kidney disease: Secondary | ICD-10-CM | POA: Diagnosis not present

## 2020-11-21 DIAGNOSIS — N186 End stage renal disease: Secondary | ICD-10-CM | POA: Diagnosis not present

## 2020-11-22 DIAGNOSIS — D631 Anemia in chronic kidney disease: Secondary | ICD-10-CM | POA: Diagnosis not present

## 2020-11-22 DIAGNOSIS — N186 End stage renal disease: Secondary | ICD-10-CM | POA: Diagnosis not present

## 2020-11-22 DIAGNOSIS — N2581 Secondary hyperparathyroidism of renal origin: Secondary | ICD-10-CM | POA: Diagnosis not present

## 2020-11-23 ENCOUNTER — Ambulatory Visit: Payer: Medicare HMO | Admitting: Internal Medicine

## 2020-11-23 DIAGNOSIS — N186 End stage renal disease: Secondary | ICD-10-CM | POA: Diagnosis not present

## 2020-11-23 DIAGNOSIS — D631 Anemia in chronic kidney disease: Secondary | ICD-10-CM | POA: Diagnosis not present

## 2020-11-23 DIAGNOSIS — N2581 Secondary hyperparathyroidism of renal origin: Secondary | ICD-10-CM | POA: Diagnosis not present

## 2020-11-23 DIAGNOSIS — Z992 Dependence on renal dialysis: Secondary | ICD-10-CM | POA: Diagnosis not present

## 2020-11-24 DIAGNOSIS — N2581 Secondary hyperparathyroidism of renal origin: Secondary | ICD-10-CM | POA: Diagnosis not present

## 2020-11-24 DIAGNOSIS — N186 End stage renal disease: Secondary | ICD-10-CM | POA: Diagnosis not present

## 2020-11-24 DIAGNOSIS — D631 Anemia in chronic kidney disease: Secondary | ICD-10-CM | POA: Diagnosis not present

## 2020-11-25 DIAGNOSIS — D631 Anemia in chronic kidney disease: Secondary | ICD-10-CM | POA: Diagnosis not present

## 2020-11-25 DIAGNOSIS — N2581 Secondary hyperparathyroidism of renal origin: Secondary | ICD-10-CM | POA: Diagnosis not present

## 2020-11-25 DIAGNOSIS — N186 End stage renal disease: Secondary | ICD-10-CM | POA: Diagnosis not present

## 2020-11-26 DIAGNOSIS — D631 Anemia in chronic kidney disease: Secondary | ICD-10-CM | POA: Diagnosis not present

## 2020-11-26 DIAGNOSIS — N186 End stage renal disease: Secondary | ICD-10-CM | POA: Diagnosis not present

## 2020-11-26 DIAGNOSIS — N2581 Secondary hyperparathyroidism of renal origin: Secondary | ICD-10-CM | POA: Diagnosis not present

## 2020-11-27 DIAGNOSIS — N186 End stage renal disease: Secondary | ICD-10-CM | POA: Diagnosis not present

## 2020-11-27 DIAGNOSIS — D631 Anemia in chronic kidney disease: Secondary | ICD-10-CM | POA: Diagnosis not present

## 2020-11-27 DIAGNOSIS — N2581 Secondary hyperparathyroidism of renal origin: Secondary | ICD-10-CM | POA: Diagnosis not present

## 2020-11-28 DIAGNOSIS — D631 Anemia in chronic kidney disease: Secondary | ICD-10-CM | POA: Diagnosis not present

## 2020-11-28 DIAGNOSIS — N2581 Secondary hyperparathyroidism of renal origin: Secondary | ICD-10-CM | POA: Diagnosis not present

## 2020-11-28 DIAGNOSIS — N186 End stage renal disease: Secondary | ICD-10-CM | POA: Diagnosis not present

## 2020-11-29 DIAGNOSIS — D631 Anemia in chronic kidney disease: Secondary | ICD-10-CM | POA: Diagnosis not present

## 2020-11-29 DIAGNOSIS — N2581 Secondary hyperparathyroidism of renal origin: Secondary | ICD-10-CM | POA: Diagnosis not present

## 2020-11-29 DIAGNOSIS — E119 Type 2 diabetes mellitus without complications: Secondary | ICD-10-CM | POA: Diagnosis not present

## 2020-11-29 DIAGNOSIS — Z114 Encounter for screening for human immunodeficiency virus [HIV]: Secondary | ICD-10-CM | POA: Diagnosis not present

## 2020-11-29 DIAGNOSIS — N186 End stage renal disease: Secondary | ICD-10-CM | POA: Diagnosis not present

## 2020-11-29 DIAGNOSIS — Z1159 Encounter for screening for other viral diseases: Secondary | ICD-10-CM | POA: Diagnosis not present

## 2020-11-30 DIAGNOSIS — N186 End stage renal disease: Secondary | ICD-10-CM | POA: Diagnosis not present

## 2020-11-30 DIAGNOSIS — N2581 Secondary hyperparathyroidism of renal origin: Secondary | ICD-10-CM | POA: Diagnosis not present

## 2020-11-30 DIAGNOSIS — D631 Anemia in chronic kidney disease: Secondary | ICD-10-CM | POA: Diagnosis not present

## 2020-12-01 DIAGNOSIS — D631 Anemia in chronic kidney disease: Secondary | ICD-10-CM | POA: Diagnosis not present

## 2020-12-01 DIAGNOSIS — N2581 Secondary hyperparathyroidism of renal origin: Secondary | ICD-10-CM | POA: Diagnosis not present

## 2020-12-01 DIAGNOSIS — N186 End stage renal disease: Secondary | ICD-10-CM | POA: Diagnosis not present

## 2020-12-02 DIAGNOSIS — N2581 Secondary hyperparathyroidism of renal origin: Secondary | ICD-10-CM | POA: Diagnosis not present

## 2020-12-02 DIAGNOSIS — N186 End stage renal disease: Secondary | ICD-10-CM | POA: Diagnosis not present

## 2020-12-02 DIAGNOSIS — D631 Anemia in chronic kidney disease: Secondary | ICD-10-CM | POA: Diagnosis not present

## 2020-12-03 DIAGNOSIS — N2581 Secondary hyperparathyroidism of renal origin: Secondary | ICD-10-CM | POA: Diagnosis not present

## 2020-12-03 DIAGNOSIS — D631 Anemia in chronic kidney disease: Secondary | ICD-10-CM | POA: Diagnosis not present

## 2020-12-03 DIAGNOSIS — N186 End stage renal disease: Secondary | ICD-10-CM | POA: Diagnosis not present

## 2020-12-04 DIAGNOSIS — D631 Anemia in chronic kidney disease: Secondary | ICD-10-CM | POA: Diagnosis not present

## 2020-12-04 DIAGNOSIS — N186 End stage renal disease: Secondary | ICD-10-CM | POA: Diagnosis not present

## 2020-12-04 DIAGNOSIS — N2581 Secondary hyperparathyroidism of renal origin: Secondary | ICD-10-CM | POA: Diagnosis not present

## 2020-12-05 DIAGNOSIS — N186 End stage renal disease: Secondary | ICD-10-CM | POA: Diagnosis not present

## 2020-12-05 DIAGNOSIS — D631 Anemia in chronic kidney disease: Secondary | ICD-10-CM | POA: Diagnosis not present

## 2020-12-05 DIAGNOSIS — N2581 Secondary hyperparathyroidism of renal origin: Secondary | ICD-10-CM | POA: Diagnosis not present

## 2020-12-06 DIAGNOSIS — D631 Anemia in chronic kidney disease: Secondary | ICD-10-CM | POA: Diagnosis not present

## 2020-12-06 DIAGNOSIS — N2581 Secondary hyperparathyroidism of renal origin: Secondary | ICD-10-CM | POA: Diagnosis not present

## 2020-12-06 DIAGNOSIS — N186 End stage renal disease: Secondary | ICD-10-CM | POA: Diagnosis not present

## 2020-12-07 ENCOUNTER — Telehealth: Payer: Self-pay | Admitting: Internal Medicine

## 2020-12-07 DIAGNOSIS — N186 End stage renal disease: Secondary | ICD-10-CM | POA: Diagnosis not present

## 2020-12-07 DIAGNOSIS — D631 Anemia in chronic kidney disease: Secondary | ICD-10-CM | POA: Diagnosis not present

## 2020-12-07 DIAGNOSIS — N2581 Secondary hyperparathyroidism of renal origin: Secondary | ICD-10-CM | POA: Diagnosis not present

## 2020-12-07 NOTE — Telephone Encounter (Signed)
    Danae Chen from [facility] requesting verbal order to discontinue OT therapy, patient has met goals  Phone [phone]

## 2020-12-08 DIAGNOSIS — D631 Anemia in chronic kidney disease: Secondary | ICD-10-CM | POA: Diagnosis not present

## 2020-12-08 DIAGNOSIS — N2581 Secondary hyperparathyroidism of renal origin: Secondary | ICD-10-CM | POA: Diagnosis not present

## 2020-12-08 DIAGNOSIS — N186 End stage renal disease: Secondary | ICD-10-CM | POA: Diagnosis not present

## 2020-12-08 NOTE — Telephone Encounter (Signed)
Ok Thx 

## 2020-12-08 NOTE — Telephone Encounter (Signed)
Notified Erica w/MD response.Marland KitchenJohny Chess

## 2020-12-09 DIAGNOSIS — N2581 Secondary hyperparathyroidism of renal origin: Secondary | ICD-10-CM | POA: Diagnosis not present

## 2020-12-09 DIAGNOSIS — N186 End stage renal disease: Secondary | ICD-10-CM | POA: Diagnosis not present

## 2020-12-09 DIAGNOSIS — D631 Anemia in chronic kidney disease: Secondary | ICD-10-CM | POA: Diagnosis not present

## 2020-12-10 DIAGNOSIS — N186 End stage renal disease: Secondary | ICD-10-CM | POA: Diagnosis not present

## 2020-12-10 DIAGNOSIS — N2581 Secondary hyperparathyroidism of renal origin: Secondary | ICD-10-CM | POA: Diagnosis not present

## 2020-12-10 DIAGNOSIS — D631 Anemia in chronic kidney disease: Secondary | ICD-10-CM | POA: Diagnosis not present

## 2020-12-11 DIAGNOSIS — E1122 Type 2 diabetes mellitus with diabetic chronic kidney disease: Secondary | ICD-10-CM | POA: Diagnosis not present

## 2020-12-11 DIAGNOSIS — M25611 Stiffness of right shoulder, not elsewhere classified: Secondary | ICD-10-CM | POA: Diagnosis not present

## 2020-12-11 DIAGNOSIS — D631 Anemia in chronic kidney disease: Secondary | ICD-10-CM | POA: Diagnosis not present

## 2020-12-11 DIAGNOSIS — I69351 Hemiplegia and hemiparesis following cerebral infarction affecting right dominant side: Secondary | ICD-10-CM | POA: Diagnosis not present

## 2020-12-11 DIAGNOSIS — E1142 Type 2 diabetes mellitus with diabetic polyneuropathy: Secondary | ICD-10-CM | POA: Diagnosis not present

## 2020-12-11 DIAGNOSIS — N186 End stage renal disease: Secondary | ICD-10-CM | POA: Diagnosis not present

## 2020-12-11 DIAGNOSIS — I120 Hypertensive chronic kidney disease with stage 5 chronic kidney disease or end stage renal disease: Secondary | ICD-10-CM | POA: Diagnosis not present

## 2020-12-11 DIAGNOSIS — I2510 Atherosclerotic heart disease of native coronary artery without angina pectoris: Secondary | ICD-10-CM | POA: Diagnosis not present

## 2020-12-11 DIAGNOSIS — N2581 Secondary hyperparathyroidism of renal origin: Secondary | ICD-10-CM | POA: Diagnosis not present

## 2020-12-11 DIAGNOSIS — G8929 Other chronic pain: Secondary | ICD-10-CM | POA: Diagnosis not present

## 2020-12-12 DIAGNOSIS — N2581 Secondary hyperparathyroidism of renal origin: Secondary | ICD-10-CM | POA: Diagnosis not present

## 2020-12-12 DIAGNOSIS — N186 End stage renal disease: Secondary | ICD-10-CM | POA: Diagnosis not present

## 2020-12-12 DIAGNOSIS — D631 Anemia in chronic kidney disease: Secondary | ICD-10-CM | POA: Diagnosis not present

## 2020-12-13 DIAGNOSIS — N186 End stage renal disease: Secondary | ICD-10-CM | POA: Diagnosis not present

## 2020-12-13 DIAGNOSIS — D631 Anemia in chronic kidney disease: Secondary | ICD-10-CM | POA: Diagnosis not present

## 2020-12-13 DIAGNOSIS — N2581 Secondary hyperparathyroidism of renal origin: Secondary | ICD-10-CM | POA: Diagnosis not present

## 2020-12-14 DIAGNOSIS — D631 Anemia in chronic kidney disease: Secondary | ICD-10-CM | POA: Diagnosis not present

## 2020-12-14 DIAGNOSIS — N186 End stage renal disease: Secondary | ICD-10-CM | POA: Diagnosis not present

## 2020-12-14 DIAGNOSIS — N2581 Secondary hyperparathyroidism of renal origin: Secondary | ICD-10-CM | POA: Diagnosis not present

## 2020-12-15 DIAGNOSIS — N186 End stage renal disease: Secondary | ICD-10-CM | POA: Diagnosis not present

## 2020-12-15 DIAGNOSIS — N2581 Secondary hyperparathyroidism of renal origin: Secondary | ICD-10-CM | POA: Diagnosis not present

## 2020-12-15 DIAGNOSIS — D631 Anemia in chronic kidney disease: Secondary | ICD-10-CM | POA: Diagnosis not present

## 2020-12-15 NOTE — Telephone Encounter (Signed)
Refaxed last order that was signed back in May.Marland KitchenJohny Chess

## 2020-12-15 NOTE — Telephone Encounter (Signed)
    [name] with [facility] is requesting the order be re faxed. She said that the top half of the page was cut off. It can be faxed to [phone]  Phone: [phone]; ext [phone]

## 2020-12-16 DIAGNOSIS — N2581 Secondary hyperparathyroidism of renal origin: Secondary | ICD-10-CM | POA: Diagnosis not present

## 2020-12-16 DIAGNOSIS — N186 End stage renal disease: Secondary | ICD-10-CM | POA: Diagnosis not present

## 2020-12-16 DIAGNOSIS — D631 Anemia in chronic kidney disease: Secondary | ICD-10-CM | POA: Diagnosis not present

## 2020-12-17 DIAGNOSIS — N2581 Secondary hyperparathyroidism of renal origin: Secondary | ICD-10-CM | POA: Diagnosis not present

## 2020-12-17 DIAGNOSIS — D631 Anemia in chronic kidney disease: Secondary | ICD-10-CM | POA: Diagnosis not present

## 2020-12-17 DIAGNOSIS — N186 End stage renal disease: Secondary | ICD-10-CM | POA: Diagnosis not present

## 2020-12-18 DIAGNOSIS — N2581 Secondary hyperparathyroidism of renal origin: Secondary | ICD-10-CM | POA: Diagnosis not present

## 2020-12-18 DIAGNOSIS — D631 Anemia in chronic kidney disease: Secondary | ICD-10-CM | POA: Diagnosis not present

## 2020-12-18 DIAGNOSIS — N186 End stage renal disease: Secondary | ICD-10-CM | POA: Diagnosis not present

## 2020-12-19 DIAGNOSIS — N186 End stage renal disease: Secondary | ICD-10-CM | POA: Diagnosis not present

## 2020-12-19 DIAGNOSIS — N2581 Secondary hyperparathyroidism of renal origin: Secondary | ICD-10-CM | POA: Diagnosis not present

## 2020-12-19 DIAGNOSIS — D631 Anemia in chronic kidney disease: Secondary | ICD-10-CM | POA: Diagnosis not present

## 2020-12-20 DIAGNOSIS — D631 Anemia in chronic kidney disease: Secondary | ICD-10-CM | POA: Diagnosis not present

## 2020-12-20 DIAGNOSIS — N2581 Secondary hyperparathyroidism of renal origin: Secondary | ICD-10-CM | POA: Diagnosis not present

## 2020-12-20 DIAGNOSIS — N186 End stage renal disease: Secondary | ICD-10-CM | POA: Diagnosis not present

## 2020-12-21 DIAGNOSIS — D631 Anemia in chronic kidney disease: Secondary | ICD-10-CM | POA: Diagnosis not present

## 2020-12-21 DIAGNOSIS — N186 End stage renal disease: Secondary | ICD-10-CM | POA: Diagnosis not present

## 2020-12-21 DIAGNOSIS — N2581 Secondary hyperparathyroidism of renal origin: Secondary | ICD-10-CM | POA: Diagnosis not present

## 2020-12-22 DIAGNOSIS — N2581 Secondary hyperparathyroidism of renal origin: Secondary | ICD-10-CM | POA: Diagnosis not present

## 2020-12-22 DIAGNOSIS — N186 End stage renal disease: Secondary | ICD-10-CM | POA: Diagnosis not present

## 2020-12-22 DIAGNOSIS — D631 Anemia in chronic kidney disease: Secondary | ICD-10-CM | POA: Diagnosis not present

## 2020-12-23 DIAGNOSIS — N186 End stage renal disease: Secondary | ICD-10-CM | POA: Diagnosis not present

## 2020-12-23 DIAGNOSIS — Z992 Dependence on renal dialysis: Secondary | ICD-10-CM | POA: Diagnosis not present

## 2020-12-23 DIAGNOSIS — D631 Anemia in chronic kidney disease: Secondary | ICD-10-CM | POA: Diagnosis not present

## 2020-12-23 DIAGNOSIS — N2581 Secondary hyperparathyroidism of renal origin: Secondary | ICD-10-CM | POA: Diagnosis not present

## 2020-12-24 DIAGNOSIS — N186 End stage renal disease: Secondary | ICD-10-CM | POA: Diagnosis not present

## 2020-12-24 DIAGNOSIS — N2581 Secondary hyperparathyroidism of renal origin: Secondary | ICD-10-CM | POA: Diagnosis not present

## 2020-12-24 DIAGNOSIS — D631 Anemia in chronic kidney disease: Secondary | ICD-10-CM | POA: Diagnosis not present

## 2020-12-25 DIAGNOSIS — N2581 Secondary hyperparathyroidism of renal origin: Secondary | ICD-10-CM | POA: Diagnosis not present

## 2020-12-25 DIAGNOSIS — N186 End stage renal disease: Secondary | ICD-10-CM | POA: Diagnosis not present

## 2020-12-25 DIAGNOSIS — D631 Anemia in chronic kidney disease: Secondary | ICD-10-CM | POA: Diagnosis not present

## 2020-12-26 DIAGNOSIS — N186 End stage renal disease: Secondary | ICD-10-CM | POA: Diagnosis not present

## 2020-12-26 DIAGNOSIS — D631 Anemia in chronic kidney disease: Secondary | ICD-10-CM | POA: Diagnosis not present

## 2020-12-26 DIAGNOSIS — N2581 Secondary hyperparathyroidism of renal origin: Secondary | ICD-10-CM | POA: Diagnosis not present

## 2020-12-27 DIAGNOSIS — D631 Anemia in chronic kidney disease: Secondary | ICD-10-CM | POA: Diagnosis not present

## 2020-12-27 DIAGNOSIS — N186 End stage renal disease: Secondary | ICD-10-CM | POA: Diagnosis not present

## 2020-12-27 DIAGNOSIS — N2581 Secondary hyperparathyroidism of renal origin: Secondary | ICD-10-CM | POA: Diagnosis not present

## 2020-12-28 DIAGNOSIS — D631 Anemia in chronic kidney disease: Secondary | ICD-10-CM | POA: Diagnosis not present

## 2020-12-28 DIAGNOSIS — N2581 Secondary hyperparathyroidism of renal origin: Secondary | ICD-10-CM | POA: Diagnosis not present

## 2020-12-28 DIAGNOSIS — N186 End stage renal disease: Secondary | ICD-10-CM | POA: Diagnosis not present

## 2020-12-29 DIAGNOSIS — E119 Type 2 diabetes mellitus without complications: Secondary | ICD-10-CM | POA: Diagnosis not present

## 2020-12-29 DIAGNOSIS — N186 End stage renal disease: Secondary | ICD-10-CM | POA: Diagnosis not present

## 2020-12-29 DIAGNOSIS — D631 Anemia in chronic kidney disease: Secondary | ICD-10-CM | POA: Diagnosis not present

## 2020-12-29 DIAGNOSIS — N2581 Secondary hyperparathyroidism of renal origin: Secondary | ICD-10-CM | POA: Diagnosis not present

## 2020-12-30 DIAGNOSIS — N186 End stage renal disease: Secondary | ICD-10-CM | POA: Diagnosis not present

## 2020-12-30 DIAGNOSIS — N2581 Secondary hyperparathyroidism of renal origin: Secondary | ICD-10-CM | POA: Diagnosis not present

## 2020-12-30 DIAGNOSIS — D631 Anemia in chronic kidney disease: Secondary | ICD-10-CM | POA: Diagnosis not present

## 2020-12-31 DIAGNOSIS — E118 Type 2 diabetes mellitus with unspecified complications: Secondary | ICD-10-CM | POA: Diagnosis not present

## 2020-12-31 DIAGNOSIS — D631 Anemia in chronic kidney disease: Secondary | ICD-10-CM | POA: Diagnosis not present

## 2020-12-31 DIAGNOSIS — N2581 Secondary hyperparathyroidism of renal origin: Secondary | ICD-10-CM | POA: Diagnosis not present

## 2020-12-31 DIAGNOSIS — N186 End stage renal disease: Secondary | ICD-10-CM | POA: Diagnosis not present

## 2021-01-01 DIAGNOSIS — D631 Anemia in chronic kidney disease: Secondary | ICD-10-CM | POA: Diagnosis not present

## 2021-01-01 DIAGNOSIS — N186 End stage renal disease: Secondary | ICD-10-CM | POA: Diagnosis not present

## 2021-01-01 DIAGNOSIS — N2581 Secondary hyperparathyroidism of renal origin: Secondary | ICD-10-CM | POA: Diagnosis not present

## 2021-01-02 DIAGNOSIS — N2581 Secondary hyperparathyroidism of renal origin: Secondary | ICD-10-CM | POA: Diagnosis not present

## 2021-01-02 DIAGNOSIS — D631 Anemia in chronic kidney disease: Secondary | ICD-10-CM | POA: Diagnosis not present

## 2021-01-02 DIAGNOSIS — N186 End stage renal disease: Secondary | ICD-10-CM | POA: Diagnosis not present

## 2021-01-03 DIAGNOSIS — N2581 Secondary hyperparathyroidism of renal origin: Secondary | ICD-10-CM | POA: Diagnosis not present

## 2021-01-03 DIAGNOSIS — N186 End stage renal disease: Secondary | ICD-10-CM | POA: Diagnosis not present

## 2021-01-03 DIAGNOSIS — D631 Anemia in chronic kidney disease: Secondary | ICD-10-CM | POA: Diagnosis not present

## 2021-01-04 DIAGNOSIS — N2581 Secondary hyperparathyroidism of renal origin: Secondary | ICD-10-CM | POA: Diagnosis not present

## 2021-01-04 DIAGNOSIS — D631 Anemia in chronic kidney disease: Secondary | ICD-10-CM | POA: Diagnosis not present

## 2021-01-04 DIAGNOSIS — N186 End stage renal disease: Secondary | ICD-10-CM | POA: Diagnosis not present

## 2021-01-05 DIAGNOSIS — E876 Hypokalemia: Secondary | ICD-10-CM | POA: Diagnosis not present

## 2021-01-05 DIAGNOSIS — N186 End stage renal disease: Secondary | ICD-10-CM | POA: Diagnosis not present

## 2021-01-05 DIAGNOSIS — D631 Anemia in chronic kidney disease: Secondary | ICD-10-CM | POA: Diagnosis not present

## 2021-01-05 DIAGNOSIS — N2581 Secondary hyperparathyroidism of renal origin: Secondary | ICD-10-CM | POA: Diagnosis not present

## 2021-01-06 DIAGNOSIS — N2581 Secondary hyperparathyroidism of renal origin: Secondary | ICD-10-CM | POA: Diagnosis not present

## 2021-01-06 DIAGNOSIS — D631 Anemia in chronic kidney disease: Secondary | ICD-10-CM | POA: Diagnosis not present

## 2021-01-06 DIAGNOSIS — N186 End stage renal disease: Secondary | ICD-10-CM | POA: Diagnosis not present

## 2021-01-07 DIAGNOSIS — D631 Anemia in chronic kidney disease: Secondary | ICD-10-CM | POA: Diagnosis not present

## 2021-01-07 DIAGNOSIS — N186 End stage renal disease: Secondary | ICD-10-CM | POA: Diagnosis not present

## 2021-01-07 DIAGNOSIS — N2581 Secondary hyperparathyroidism of renal origin: Secondary | ICD-10-CM | POA: Diagnosis not present

## 2021-01-08 DIAGNOSIS — N186 End stage renal disease: Secondary | ICD-10-CM | POA: Diagnosis not present

## 2021-01-08 DIAGNOSIS — D631 Anemia in chronic kidney disease: Secondary | ICD-10-CM | POA: Diagnosis not present

## 2021-01-08 DIAGNOSIS — N2581 Secondary hyperparathyroidism of renal origin: Secondary | ICD-10-CM | POA: Diagnosis not present

## 2021-01-09 DIAGNOSIS — N186 End stage renal disease: Secondary | ICD-10-CM | POA: Diagnosis not present

## 2021-01-09 DIAGNOSIS — D631 Anemia in chronic kidney disease: Secondary | ICD-10-CM | POA: Diagnosis not present

## 2021-01-09 DIAGNOSIS — N2581 Secondary hyperparathyroidism of renal origin: Secondary | ICD-10-CM | POA: Diagnosis not present

## 2021-01-10 ENCOUNTER — Ambulatory Visit: Payer: Medicaid Other | Admitting: Internal Medicine

## 2021-01-10 DIAGNOSIS — N186 End stage renal disease: Secondary | ICD-10-CM | POA: Diagnosis not present

## 2021-01-10 DIAGNOSIS — N2581 Secondary hyperparathyroidism of renal origin: Secondary | ICD-10-CM | POA: Diagnosis not present

## 2021-01-10 DIAGNOSIS — D631 Anemia in chronic kidney disease: Secondary | ICD-10-CM | POA: Diagnosis not present

## 2021-01-11 DIAGNOSIS — N2581 Secondary hyperparathyroidism of renal origin: Secondary | ICD-10-CM | POA: Diagnosis not present

## 2021-01-11 DIAGNOSIS — N186 End stage renal disease: Secondary | ICD-10-CM | POA: Diagnosis not present

## 2021-01-11 DIAGNOSIS — D631 Anemia in chronic kidney disease: Secondary | ICD-10-CM | POA: Diagnosis not present

## 2021-01-12 DIAGNOSIS — N186 End stage renal disease: Secondary | ICD-10-CM | POA: Diagnosis not present

## 2021-01-12 DIAGNOSIS — N2581 Secondary hyperparathyroidism of renal origin: Secondary | ICD-10-CM | POA: Diagnosis not present

## 2021-01-12 DIAGNOSIS — D631 Anemia in chronic kidney disease: Secondary | ICD-10-CM | POA: Diagnosis not present

## 2021-01-13 DIAGNOSIS — N186 End stage renal disease: Secondary | ICD-10-CM | POA: Diagnosis not present

## 2021-01-13 DIAGNOSIS — N2581 Secondary hyperparathyroidism of renal origin: Secondary | ICD-10-CM | POA: Diagnosis not present

## 2021-01-13 DIAGNOSIS — D631 Anemia in chronic kidney disease: Secondary | ICD-10-CM | POA: Diagnosis not present

## 2021-01-14 DIAGNOSIS — D631 Anemia in chronic kidney disease: Secondary | ICD-10-CM | POA: Diagnosis not present

## 2021-01-14 DIAGNOSIS — N186 End stage renal disease: Secondary | ICD-10-CM | POA: Diagnosis not present

## 2021-01-14 DIAGNOSIS — N2581 Secondary hyperparathyroidism of renal origin: Secondary | ICD-10-CM | POA: Diagnosis not present

## 2021-01-15 DIAGNOSIS — D631 Anemia in chronic kidney disease: Secondary | ICD-10-CM | POA: Diagnosis not present

## 2021-01-15 DIAGNOSIS — N186 End stage renal disease: Secondary | ICD-10-CM | POA: Diagnosis not present

## 2021-01-15 DIAGNOSIS — N2581 Secondary hyperparathyroidism of renal origin: Secondary | ICD-10-CM | POA: Diagnosis not present

## 2021-01-16 DIAGNOSIS — D631 Anemia in chronic kidney disease: Secondary | ICD-10-CM | POA: Diagnosis not present

## 2021-01-16 DIAGNOSIS — N186 End stage renal disease: Secondary | ICD-10-CM | POA: Diagnosis not present

## 2021-01-16 DIAGNOSIS — N2581 Secondary hyperparathyroidism of renal origin: Secondary | ICD-10-CM | POA: Diagnosis not present

## 2021-01-17 DIAGNOSIS — D631 Anemia in chronic kidney disease: Secondary | ICD-10-CM | POA: Diagnosis not present

## 2021-01-17 DIAGNOSIS — N186 End stage renal disease: Secondary | ICD-10-CM | POA: Diagnosis not present

## 2021-01-17 DIAGNOSIS — N2581 Secondary hyperparathyroidism of renal origin: Secondary | ICD-10-CM | POA: Diagnosis not present

## 2021-01-18 DIAGNOSIS — N2581 Secondary hyperparathyroidism of renal origin: Secondary | ICD-10-CM | POA: Diagnosis not present

## 2021-01-18 DIAGNOSIS — D631 Anemia in chronic kidney disease: Secondary | ICD-10-CM | POA: Diagnosis not present

## 2021-01-18 DIAGNOSIS — N186 End stage renal disease: Secondary | ICD-10-CM | POA: Diagnosis not present

## 2021-01-19 ENCOUNTER — Telehealth: Payer: Self-pay | Admitting: Internal Medicine

## 2021-01-19 DIAGNOSIS — D631 Anemia in chronic kidney disease: Secondary | ICD-10-CM | POA: Diagnosis not present

## 2021-01-19 DIAGNOSIS — N186 End stage renal disease: Secondary | ICD-10-CM | POA: Diagnosis not present

## 2021-01-19 DIAGNOSIS — N2581 Secondary hyperparathyroidism of renal origin: Secondary | ICD-10-CM | POA: Diagnosis not present

## 2021-01-19 NOTE — Chronic Care Management (AMB) (Signed)
  Chronic Care Management   Outreach Note  01/19/2021 Name: Mary Valencia MRN: [id number] DOB: 10/22/1955  Referred by: Cassandria Anger, MD Reason for referral : No chief complaint on file.   An unsuccessful telephone outreach was attempted today. The patient was referred to the pharmacist for assistance with care management and care coordination.   Follow Up Plan:   Lauretta Grill Upstream Scheduler

## 2021-01-19 NOTE — Chronic Care Management (AMB) (Signed)
  Chronic Care Management   Note  01/19/2021 Name: EUGENA RHUE MRN: [id number] DOB: May 27, 1956  NIJAH TEJERA is a 65 y.o. year old female who is a primary care patient of Plotnikov, Evie Lacks, MD. I reached out to [place] by phone today in response to a referral sent by Ms. Luisa Dago Trant's PCP, Plotnikov, Evie Lacks, MD.   Ms. Arena was given information about Chronic Care Management services today including:  CCM service includes personalized support from designated clinical staff supervised by her physician, including individualized plan of care and coordination with other care providers 24/7 contact phone numbers for assistance for urgent and routine care needs. Service will only be billed when office clinical staff spend 20 minutes or more in a month to coordinate care. Only one practitioner may furnish and bill the service in a calendar month. The patient may stop CCM services at any time (effective at the end of the month) by phone call to the office staff.   Patient agreed to services and verbal consent obtained.   Follow up plan:   Lauretta Grill Upstream Scheduler

## 2021-01-20 DIAGNOSIS — D631 Anemia in chronic kidney disease: Secondary | ICD-10-CM | POA: Diagnosis not present

## 2021-01-20 DIAGNOSIS — N186 End stage renal disease: Secondary | ICD-10-CM | POA: Diagnosis not present

## 2021-01-20 DIAGNOSIS — N2581 Secondary hyperparathyroidism of renal origin: Secondary | ICD-10-CM | POA: Diagnosis not present

## 2021-01-21 DIAGNOSIS — N186 End stage renal disease: Secondary | ICD-10-CM | POA: Diagnosis not present

## 2021-01-21 DIAGNOSIS — N2581 Secondary hyperparathyroidism of renal origin: Secondary | ICD-10-CM | POA: Diagnosis not present

## 2021-01-21 DIAGNOSIS — D631 Anemia in chronic kidney disease: Secondary | ICD-10-CM | POA: Diagnosis not present

## 2021-01-22 DIAGNOSIS — N186 End stage renal disease: Secondary | ICD-10-CM | POA: Diagnosis not present

## 2021-01-22 DIAGNOSIS — N2581 Secondary hyperparathyroidism of renal origin: Secondary | ICD-10-CM | POA: Diagnosis not present

## 2021-01-22 DIAGNOSIS — D631 Anemia in chronic kidney disease: Secondary | ICD-10-CM | POA: Diagnosis not present

## 2021-01-23 DIAGNOSIS — N2581 Secondary hyperparathyroidism of renal origin: Secondary | ICD-10-CM | POA: Diagnosis not present

## 2021-01-23 DIAGNOSIS — N186 End stage renal disease: Secondary | ICD-10-CM | POA: Diagnosis not present

## 2021-01-23 DIAGNOSIS — D631 Anemia in chronic kidney disease: Secondary | ICD-10-CM | POA: Diagnosis not present

## 2021-01-23 DIAGNOSIS — Z992 Dependence on renal dialysis: Secondary | ICD-10-CM | POA: Diagnosis not present

## 2021-01-24 DIAGNOSIS — N186 End stage renal disease: Secondary | ICD-10-CM | POA: Diagnosis not present

## 2021-01-24 DIAGNOSIS — N2581 Secondary hyperparathyroidism of renal origin: Secondary | ICD-10-CM | POA: Diagnosis not present

## 2021-01-24 DIAGNOSIS — D631 Anemia in chronic kidney disease: Secondary | ICD-10-CM | POA: Diagnosis not present

## 2021-01-25 DIAGNOSIS — E119 Type 2 diabetes mellitus without complications: Secondary | ICD-10-CM | POA: Diagnosis not present

## 2021-01-25 DIAGNOSIS — N2581 Secondary hyperparathyroidism of renal origin: Secondary | ICD-10-CM | POA: Diagnosis not present

## 2021-01-25 DIAGNOSIS — N186 End stage renal disease: Secondary | ICD-10-CM | POA: Diagnosis not present

## 2021-01-25 DIAGNOSIS — D631 Anemia in chronic kidney disease: Secondary | ICD-10-CM | POA: Diagnosis not present

## 2021-01-26 DIAGNOSIS — N2581 Secondary hyperparathyroidism of renal origin: Secondary | ICD-10-CM | POA: Diagnosis not present

## 2021-01-26 DIAGNOSIS — D631 Anemia in chronic kidney disease: Secondary | ICD-10-CM | POA: Diagnosis not present

## 2021-01-26 DIAGNOSIS — N186 End stage renal disease: Secondary | ICD-10-CM | POA: Diagnosis not present

## 2021-01-27 DIAGNOSIS — N2581 Secondary hyperparathyroidism of renal origin: Secondary | ICD-10-CM | POA: Diagnosis not present

## 2021-01-27 DIAGNOSIS — D631 Anemia in chronic kidney disease: Secondary | ICD-10-CM | POA: Diagnosis not present

## 2021-01-27 DIAGNOSIS — N186 End stage renal disease: Secondary | ICD-10-CM | POA: Diagnosis not present

## 2021-01-28 DIAGNOSIS — N2581 Secondary hyperparathyroidism of renal origin: Secondary | ICD-10-CM | POA: Diagnosis not present

## 2021-01-28 DIAGNOSIS — D631 Anemia in chronic kidney disease: Secondary | ICD-10-CM | POA: Diagnosis not present

## 2021-01-28 DIAGNOSIS — N186 End stage renal disease: Secondary | ICD-10-CM | POA: Diagnosis not present

## 2021-01-29 DIAGNOSIS — N2581 Secondary hyperparathyroidism of renal origin: Secondary | ICD-10-CM | POA: Diagnosis not present

## 2021-01-29 DIAGNOSIS — N186 End stage renal disease: Secondary | ICD-10-CM | POA: Diagnosis not present

## 2021-01-29 DIAGNOSIS — D631 Anemia in chronic kidney disease: Secondary | ICD-10-CM | POA: Diagnosis not present

## 2021-01-30 DIAGNOSIS — N2581 Secondary hyperparathyroidism of renal origin: Secondary | ICD-10-CM | POA: Diagnosis not present

## 2021-01-30 DIAGNOSIS — N186 End stage renal disease: Secondary | ICD-10-CM | POA: Diagnosis not present

## 2021-01-30 DIAGNOSIS — D631 Anemia in chronic kidney disease: Secondary | ICD-10-CM | POA: Diagnosis not present

## 2021-01-31 DIAGNOSIS — N2581 Secondary hyperparathyroidism of renal origin: Secondary | ICD-10-CM | POA: Diagnosis not present

## 2021-01-31 DIAGNOSIS — D631 Anemia in chronic kidney disease: Secondary | ICD-10-CM | POA: Diagnosis not present

## 2021-01-31 DIAGNOSIS — N186 End stage renal disease: Secondary | ICD-10-CM | POA: Diagnosis not present

## 2021-02-01 ENCOUNTER — Telehealth: Payer: Self-pay

## 2021-02-01 DIAGNOSIS — N2581 Secondary hyperparathyroidism of renal origin: Secondary | ICD-10-CM | POA: Diagnosis not present

## 2021-02-01 DIAGNOSIS — D631 Anemia in chronic kidney disease: Secondary | ICD-10-CM | POA: Diagnosis not present

## 2021-02-01 DIAGNOSIS — N186 End stage renal disease: Secondary | ICD-10-CM | POA: Diagnosis not present

## 2021-02-01 NOTE — Chronic Care Management (AMB) (Signed)
Chronic Care Management Pharmacy Assistant   Name: Mary Valencia  MRN: [id number] DOB: 05-Feb-1956  Mary Valencia is an 65 y.o. year old female who presents for his initial CCM visit with the clinical pharmacist.   Recent office visits:  10/11/20-Aleksey V. Plotnikov, MD (PCP) 3 month follow up. Ambulatory referral to Home Health. Follow up in 3 months.  08/23/20-Aleksei V. Plotnikoc, MD (PCP) 3 month follow up. Follow up in 3 months.  Recent consult visits:  10/12/20-Jessica Young Berry ([facility]   09/28/20 Ricard Dillon, MD ([facility])   Hospital visits:  None in previous 6 months  Medications: Outpatient Encounter Medications as of 02/01/2021  Medication Sig Note   Alcohol Swabs (B-D SINGLE USE SWABS REGULAR) PADS Use as directed to clean site to check blood sugars    atorvastatin (LIPITOR) 40 MG tablet Take 1 tablet (40 mg total) by mouth daily. Follow-up appt due in Feb w/Lipid check must see provider for future refills    Blood Glucose Monitoring Suppl (TRUE METRIX METER) w/Device KIT USE AS DIRECTED TO CHECK BLOOD SUGAR    calcium acetate, Phos Binder, (PHOSLYRA) 667 MG/5ML SOLN Take 667 mg by mouth 3 (three) times daily with meals.    calcium carbonate (TUMS - DOSED IN MG ELEMENTAL CALCIUM) 500 MG chewable tablet     CALCIUM-MAGNESIUM-VITAMIN D PO Take 1 tablet by mouth daily. 10/06/2015: Received from: [place]: Take by mouth.   Continuous Blood Gluc Receiver (FREESTYLE LIBRE 14 DAY READER) DEVI 1 Units by Does not apply route daily.    Continuous Blood Gluc Sensor (FREESTYLE LIBRE 14 DAY SENSOR) MISC 1 Units by Does not apply route every 14 (fourteen) days.    diphenhydrAMINE (BENADRYL) 25 mg capsule Take 25 mg by mouth as needed. 10/06/2015: Received from: [place]: Take 25 mg by mouth.   glucose blood (TRUE METRIX BLOOD GLUCOSE TEST) test strip Use to check blood sugars  twice a day    glucose blood test strip TEST BLOOD GLUCOSE THREE TIMES DAILY    hydrALAZINE (APRESOLINE) 100 MG tablet Take 1 tablet (100 mg total) by mouth 3 (three) times daily.    Insulin NPH Human, Isophane, (NOVOLIN N [place]) Inject 2 Doses into the skin. 5 mg in morning, 7 mg at night    labetalol (NORMODYNE) 200 MG tablet Take 1 tablet (200 mg total) by mouth 3 (three) times daily.    losartan (COZAAR) 50 MG tablet Take 1 tablet (50 mg total) by mouth daily. (Patient taking differently: Take 50 mg by mouth in the morning and at bedtime.)    mupirocin ointment (BACTROBAN) 2 % On leg wound w/dressing change qd or bid    NIFEdipine (ADALAT CC) 60 MG 24 hr tablet     NOVOLOG 100 UNIT/ML injection Inject 7 Units into the skin in the morning and at bedtime.    omeprazole (PRILOSEC) 40 MG capsule TAKE 1 CAPSULE EVERY DAY    polyethylene glycol (MIRALAX / GLYCOLAX) packet Take 17 g by mouth.    PRESCRIPTION MEDICATION 210 mg 3 (three) times daily before meals.     senna-docusate (SENOKOT-S) 8.6-50 MG tablet Take by mouth 2 (two) times daily. Take 2 tab twice daily.    talc powder Apply topically as needed. Under breasts    torsemide (DEMADEX) 20 MG tablet Take 40 mg by mouth.  03/28/2017: Pt report taking BID   TRUEplus Lancets  33G MISC Use to help check blood sugar twice a day    No facility-administered encounter medications on file as of 02/01/2021.   Atorvastatin (LIPITOR) 40 MG tablet Last filled:07/22/20 90 DS Calcium acetate, Phos Binder, (PHOSLYRA) 667 MG/5ML SOLN Last filled:07/07/19 90 DS Calcium carbonate (TUMS - DOSED IN MG ELEMENTAL CALCIUM) 500 MG chewable tablet Last filled:09/10/19 30 DS DiphenhydrAMINE (BENADRYL) 25 mg capsule Last filled:None noted HydrALAZINE (APRESOLINE) 100 MG tablet Last filled:01/09/21 90 DS Insulin NPH Human, Isophane, (NOVOLIN N [place]) Last filled:03/26/20 28 DS Labetalol (NORMODYNE) 200 MG tablet Last filled:02/25/20 15 DS Losartan (COZAAR) 50 MG tablet Last  filled:11/17/20 90 DS Mupirocin ointment (BACTROBAN) 2 % Last filled:12/24/19 10 DS NIFEdipine (ADALAT CC) 60 MG 24 hr tablet Last filled:11/11/20 90 DS NOVOLOG 100 UNIT/ML injection Last filled:09/15/19 28 DS Omeprazole (PRILOSEC) 40 MG capsule Last filled:11/11/20 90 DS Polyethylene glycol (MIRALAX / GLYCOLAX) packet Last filled:None noted Senna-docusate (SENOKOT-S) 8.6-50 MG tablet Last filled:None noted Torsemide (DEMADEX) 20 MG tablet Last filled:09/10/19 30 DS   Star Rating Drugs: Losartan (COZAAR) 50 MG tablet Last filled:11/17/20 90 DS Atorvastatin (LIPITOR) 40 MG tablet Last filled:07/22/20 90 DS  Myriam Elta [place], [place]

## 2021-02-02 DIAGNOSIS — N186 End stage renal disease: Secondary | ICD-10-CM | POA: Diagnosis not present

## 2021-02-02 DIAGNOSIS — N2581 Secondary hyperparathyroidism of renal origin: Secondary | ICD-10-CM | POA: Diagnosis not present

## 2021-02-02 DIAGNOSIS — D631 Anemia in chronic kidney disease: Secondary | ICD-10-CM | POA: Diagnosis not present

## 2021-02-03 DIAGNOSIS — D631 Anemia in chronic kidney disease: Secondary | ICD-10-CM | POA: Diagnosis not present

## 2021-02-03 DIAGNOSIS — N186 End stage renal disease: Secondary | ICD-10-CM | POA: Diagnosis not present

## 2021-02-03 DIAGNOSIS — N2581 Secondary hyperparathyroidism of renal origin: Secondary | ICD-10-CM | POA: Diagnosis not present

## 2021-02-04 DIAGNOSIS — D631 Anemia in chronic kidney disease: Secondary | ICD-10-CM | POA: Diagnosis not present

## 2021-02-04 DIAGNOSIS — N2581 Secondary hyperparathyroidism of renal origin: Secondary | ICD-10-CM | POA: Diagnosis not present

## 2021-02-04 DIAGNOSIS — N186 End stage renal disease: Secondary | ICD-10-CM | POA: Diagnosis not present

## 2021-02-05 DIAGNOSIS — N186 End stage renal disease: Secondary | ICD-10-CM | POA: Diagnosis not present

## 2021-02-05 DIAGNOSIS — D631 Anemia in chronic kidney disease: Secondary | ICD-10-CM | POA: Diagnosis not present

## 2021-02-05 DIAGNOSIS — N2581 Secondary hyperparathyroidism of renal origin: Secondary | ICD-10-CM | POA: Diagnosis not present

## 2021-02-06 DIAGNOSIS — N186 End stage renal disease: Secondary | ICD-10-CM | POA: Diagnosis not present

## 2021-02-06 DIAGNOSIS — N2581 Secondary hyperparathyroidism of renal origin: Secondary | ICD-10-CM | POA: Diagnosis not present

## 2021-02-06 DIAGNOSIS — D631 Anemia in chronic kidney disease: Secondary | ICD-10-CM | POA: Diagnosis not present

## 2021-02-07 DIAGNOSIS — D631 Anemia in chronic kidney disease: Secondary | ICD-10-CM | POA: Diagnosis not present

## 2021-02-07 DIAGNOSIS — N2581 Secondary hyperparathyroidism of renal origin: Secondary | ICD-10-CM | POA: Diagnosis not present

## 2021-02-07 DIAGNOSIS — N186 End stage renal disease: Secondary | ICD-10-CM | POA: Diagnosis not present

## 2021-02-08 DIAGNOSIS — N2581 Secondary hyperparathyroidism of renal origin: Secondary | ICD-10-CM | POA: Diagnosis not present

## 2021-02-08 DIAGNOSIS — D631 Anemia in chronic kidney disease: Secondary | ICD-10-CM | POA: Diagnosis not present

## 2021-02-08 DIAGNOSIS — N186 End stage renal disease: Secondary | ICD-10-CM | POA: Diagnosis not present

## 2021-02-09 DIAGNOSIS — D631 Anemia in chronic kidney disease: Secondary | ICD-10-CM | POA: Diagnosis not present

## 2021-02-09 DIAGNOSIS — N186 End stage renal disease: Secondary | ICD-10-CM | POA: Diagnosis not present

## 2021-02-09 DIAGNOSIS — N2581 Secondary hyperparathyroidism of renal origin: Secondary | ICD-10-CM | POA: Diagnosis not present

## 2021-02-10 DIAGNOSIS — N186 End stage renal disease: Secondary | ICD-10-CM | POA: Diagnosis not present

## 2021-02-10 DIAGNOSIS — N2581 Secondary hyperparathyroidism of renal origin: Secondary | ICD-10-CM | POA: Diagnosis not present

## 2021-02-10 DIAGNOSIS — D631 Anemia in chronic kidney disease: Secondary | ICD-10-CM | POA: Diagnosis not present

## 2021-02-11 DIAGNOSIS — N2581 Secondary hyperparathyroidism of renal origin: Secondary | ICD-10-CM | POA: Diagnosis not present

## 2021-02-11 DIAGNOSIS — N186 End stage renal disease: Secondary | ICD-10-CM | POA: Diagnosis not present

## 2021-02-11 DIAGNOSIS — D631 Anemia in chronic kidney disease: Secondary | ICD-10-CM | POA: Diagnosis not present

## 2021-02-12 DIAGNOSIS — N2581 Secondary hyperparathyroidism of renal origin: Secondary | ICD-10-CM | POA: Diagnosis not present

## 2021-02-12 DIAGNOSIS — N186 End stage renal disease: Secondary | ICD-10-CM | POA: Diagnosis not present

## 2021-02-12 DIAGNOSIS — D631 Anemia in chronic kidney disease: Secondary | ICD-10-CM | POA: Diagnosis not present

## 2021-02-13 DIAGNOSIS — N186 End stage renal disease: Secondary | ICD-10-CM | POA: Diagnosis not present

## 2021-02-13 DIAGNOSIS — D631 Anemia in chronic kidney disease: Secondary | ICD-10-CM | POA: Diagnosis not present

## 2021-02-13 DIAGNOSIS — N2581 Secondary hyperparathyroidism of renal origin: Secondary | ICD-10-CM | POA: Diagnosis not present

## 2021-02-14 DIAGNOSIS — N186 End stage renal disease: Secondary | ICD-10-CM | POA: Diagnosis not present

## 2021-02-14 DIAGNOSIS — D631 Anemia in chronic kidney disease: Secondary | ICD-10-CM | POA: Diagnosis not present

## 2021-02-14 DIAGNOSIS — N2581 Secondary hyperparathyroidism of renal origin: Secondary | ICD-10-CM | POA: Diagnosis not present

## 2021-02-15 DIAGNOSIS — N2581 Secondary hyperparathyroidism of renal origin: Secondary | ICD-10-CM | POA: Diagnosis not present

## 2021-02-15 DIAGNOSIS — D631 Anemia in chronic kidney disease: Secondary | ICD-10-CM | POA: Diagnosis not present

## 2021-02-15 DIAGNOSIS — N186 End stage renal disease: Secondary | ICD-10-CM | POA: Diagnosis not present

## 2021-02-16 DIAGNOSIS — N2581 Secondary hyperparathyroidism of renal origin: Secondary | ICD-10-CM | POA: Diagnosis not present

## 2021-02-16 DIAGNOSIS — D631 Anemia in chronic kidney disease: Secondary | ICD-10-CM | POA: Diagnosis not present

## 2021-02-16 DIAGNOSIS — N186 End stage renal disease: Secondary | ICD-10-CM | POA: Diagnosis not present

## 2021-02-17 ENCOUNTER — Telehealth: Payer: Medicaid Other

## 2021-02-17 DIAGNOSIS — D631 Anemia in chronic kidney disease: Secondary | ICD-10-CM | POA: Diagnosis not present

## 2021-02-17 DIAGNOSIS — N2581 Secondary hyperparathyroidism of renal origin: Secondary | ICD-10-CM | POA: Diagnosis not present

## 2021-02-17 DIAGNOSIS — N186 End stage renal disease: Secondary | ICD-10-CM | POA: Diagnosis not present

## 2021-02-17 NOTE — Progress Notes (Deleted)
Chronic Care Management Pharmacy Note  02/17/2021 Name:  Mary Valencia MRN:  [id number] DOB:  1956/02/26  Summary: ***  Recommendations/Changes made from today's visit: ***  Plan: ***  Subjective: Mary Valencia is an 65 y.o. year old female who is a primary patient of Mary Valencia, Mary Lacks, MD.  The CCM team was consulted for assistance with disease management and care coordination needs.    Engaged with patient by telephone for initial visit in response to provider referral for pharmacy case management and/or care coordination services.   Consent to Services:  The patient was given the following information about Chronic Care Management services today, agreed to services, and gave verbal consent: 1. CCM service includes personalized support from designated clinical staff supervised by the primary care provider, including individualized plan of care and coordination with other care providers 2. 24/7 contact phone numbers for assistance for urgent and routine care needs. 3. Service will only be billed when office clinical staff spend 20 minutes or more in a month to coordinate care. 4. Only one practitioner may furnish and bill the service in a calendar month. 5.The patient may stop CCM services at any time (effective at the end of the month) by phone call to the office staff. 6. The patient will be responsible for cost sharing (co-pay) of up to 20% of the service fee (after annual deductible is met). Patient agreed to services and consent obtained.  Patient Care Team: Mary Valencia, Mary Lacks, MD as PCP - General Mary Valencia Guest, MD (Orthopedic Surgery) Boyd Kerbs, MD as Referring Physician (Neurology) Mezer, Nadara Mustard, MD (Obstetrics and Gynecology) Thereasa Distance, MD as Referring Physician (Nephrology) Curly Rim, MD as Referring Physician (Internal Medicine) Tomasa Blase, [facility] as Pharmacist (Pharmacist)  Recent office visits:  10/11/20-Aleksey V. Plotnikov,  MD (PCP) 3 month follow up. Ambulatory referral to Home Health. Follow up in 3 months.   08/23/20-Aleksei V. Plotnikoc, MD (PCP) 3 month follow up. Follow up in 3 months.   Recent consult visits:  10/12/20-[facility] ([facility]    09/28/20 Mary Dillon, MD ([facility])    Receiving dialysis *** weekly   Hospital visits:  None in previous 6 months  Objective:  Lab Results  Component Value Date   CREATININE 13.49 (HH) 09/21/2017   BUN 66 (H) 09/21/2017   GFR 3.57 (LL) 09/21/2017   GFRNONAA 52 (L) 01/29/2012   GFRAA 61 (L) 01/29/2012   NA 141 09/21/2017   K 4.9 09/21/2017   CALCIUM 8.5 09/21/2017   CO2 27 09/21/2017   GLUCOSE 279 (H) 09/21/2017    Lab Results  Component Value Date/Time   HGBA1C 7.1 (H) 09/21/2017 10:55 AM   HGBA1C 8.0 03/29/2017 12:00 AM   HGBA1C 14.1 (H) 12/03/2013 09:21 AM   GFR 3.57 (LL) 09/21/2017 10:55 AM   GFR 63.28 12/03/2013 09:21 AM    Last diabetic Eye exam:  No results found for: HMDIABEYEEXA  Last diabetic Foot exam:  No results found for: HMDIABFOOTEX   Lab Results  Component Value Date   CHOL 161 09/21/2017   HDL 63.60 09/21/2017   LDLCALC 83 09/21/2017   LDLDIRECT 207.8 09/29/2010   TRIG 72.0 09/21/2017   CHOLHDL 3 09/21/2017    Hepatic Function Latest Ref Rng & Units 09/21/2017 09/01/2017 08/29/2017  Total Protein 6.0 - 8.3 g/dL 6.1 - -  Albumin 3.5 - 5.2 g/dL 3.2(L) 3.4 -  AST 0 - 37 U/L 21 58 58(A)  ALT 0 - 35 U/L _0 Alk Phosphatase 39 - 117 U/L 133(H) 164 -  Total Bilirubin 0.2 - 1.2 mg/dL 0.3 - -  Bilirubin, Direct 0.0 - 0.3 mg/dL 0.1 - -    Lab Results  Component Value Date/Time   TSH 2.81 09/21/2017 10:55 AM   TSH 1.743 01/29/2012 07:36 AM    CBC Latest Ref Rng & Units 09/01/2017 08/29/2017 03/29/2017  WBC - - 7.7 9.3  Hemoglobin - 29.1 9.7(A) 7.4(A)  Hematocrit 36 - 46 - 30(A) 23(A)  Platelets 150 - 399 - 203 318    No results found for:  VD25OH  Clinical ASCVD: {YES/NO:21197} The ASCVD Risk score Mikey Bussing DC Jr., et al., 2013) failed to calculate for the following reasons:   The patient has a prior MI or stroke diagnosis    Depression screen [facility] 2/9 05/24/2020 04/06/2020 04/04/2019  Decreased Interest 0 0 0  Down, Depressed, Hopeless 0 0 1  PHQ - 2 Score 0 0 1  Altered sleeping - - 1  Tired, decreased energy - - 1  Change in appetite - - 0  Feeling bad or failure about yourself  - - 0  Trouble concentrating - - 0  Moving slowly or fidgety/restless - - 0  Suicidal thoughts - - 0  PHQ-9 Score - - 3  Difficult doing work/chores - - Somewhat difficult     ***Other: (CHADS2VASc if Afib, MMRC or CAT for COPD, ACT, DEXA)  Social History   Tobacco Use  Smoking Status Never  Smokeless Tobacco Never   BP Readings from Last 3 Encounters:  10/11/20 130/62  08/23/20 98/62  07/21/20 (!) 134/50   Pulse Readings from Last 3 Encounters:  10/11/20 83  08/23/20 78  07/21/20 85   Wt Readings from Last 3 Encounters:  10/11/20 120 lb 3.2 oz (54.5 kg)  08/23/20 126 lb 3.2 oz (57.2 kg)  07/21/20 130 lb 14.4 oz (59.4 kg)   BMI Readings from Last 3 Encounters:  10/11/20 21.98 kg/m  08/23/20 23.08 kg/m  07/21/20 23.94 kg/m    Assessment/Interventions: Review of patient past medical history, allergies, medications, health status, including review of consultants reports, laboratory and other test data, was performed as part of comprehensive evaluation and provision of chronic care management services.   SDOH:  (Social Determinants of Health) assessments and interventions performed: {yes/no:20286}  SDOH Screenings   Alcohol Screen: Low Risk    Last Alcohol Screening Score (AUDIT): 0  Depression (PHQ2-9): Low Risk    PHQ-2 Score: 0  Financial Resource Strain: Low Risk    Difficulty of Paying Living Expenses: Not hard at all  Food Insecurity: No Food Insecurity   Worried About Charity fundraiser in the Last Year: Never  true   Ran Out of Food in the Last Year: Never true  Housing: Low Risk    Last Housing Risk Score: 0  Physical Activity: Inactive   Days of Exercise per Week: 0 days   Minutes of Exercise per Session: 0 min  Social Connections: Socially Isolated   Frequency of Communication with Friends and Family: More than three times a week   Frequency of Social Gatherings with Friends and Family: Once a week   Attends Religious Services: Never   Marine scientist or Organizations: No   Attends Archivist Meetings: Never   Marital Status: Never married  Stress: No Stress Concern Present   Feeling of Stress : Not at all  Tobacco Use:  Low Risk    Smoking Tobacco Use: Never   Smokeless Tobacco Use: Never  Transportation Needs: No Transportation Needs   Lack of Transportation (Medical): No   Lack of Transportation (Non-Medical): No    CCM Care Plan  Allergies  Allergen Reactions   Betadine [Povidone Iodine] Other (See Comments)    Pt says it burns   Clonidine Derivatives     Dropped BP   Gabapentin     A bad reaction - confused   Iodine Hives   Penicillins Swelling   Sulfonamide Derivatives Hives   Tetracyclines & Related Nausea And Vomiting   Naproxen Rash    Medications Reviewed Today     Reviewed by Cassandria Anger, MD (Physician) on 10/11/20 at 1144  Med List Status: <None>   Medication Order Taking? Sig Documenting Provider Last Dose Status Informant  Alcohol Swabs (B-D SINGLE USE SWABS REGULAR) PADS [id number] Yes Use as directed to clean site to check blood sugars Mary Valencia, Mary Lacks, MD Taking Active   atorvastatin (LIPITOR) 40 MG tablet [id number] Yes Take 1 tablet (40 mg total) by mouth daily. Follow-up appt due in Feb w/Lipid check must see provider for future refills Mary Valencia, Mary Lacks, MD Taking Active   Blood Glucose Monitoring Suppl (TRUE METRIX METER) w/Device KIT [id number] Yes USE AS DIRECTED TO CHECK BLOOD SUGAR Mary Valencia, Mary Lacks, MD Taking  Active   calcium acetate, Phos Binder, (PHOSLYRA) 667 MG/5ML SOLN [id number] Yes Take 667 mg by mouth 3 (three) times daily with meals. [provider] Taking Active Self  calcium carbonate (TUMS - DOSED IN MG ELEMENTAL CALCIUM) 500 MG chewable tablet [id number] Yes  [provider] Taking Active   CALCIUM-MAGNESIUM-VITAMIN D PO [id number] Yes Take 1 tablet by mouth daily. [provider] Taking Active            Med Note Rosita Fire, Rogene [place]   Wed Oct 06, 2015  1:55 PM) Received from: [place]: Take by mouth.  Continuous Blood Gluc Receiver (FREESTYLE LIBRE 14 DAY READER) DEVI [id number] Yes 1 Units by Does not apply route daily. Mary Valencia, Mary Lacks, MD Taking Active   Continuous Blood Gluc Sensor (FREESTYLE LIBRE 14 DAY SENSOR) [place] [id number] Yes 1 Units by Does not apply route every 14 (fourteen) days. Mary Valencia, Mary Lacks, MD Taking Active   diphenhydrAMINE (BENADRYL) 25 mg capsule [id number] Yes Take 25 mg by mouth as needed. [provider] Taking Active            Med Note Rosita Fire, Rogene [place]   Wed Oct 06, 2015  1:55 PM) Received from: [place]: Take 25 mg by mouth.  glucose blood (TRUE METRIX BLOOD GLUCOSE TEST) test strip [id number] Yes Use to check blood sugars twice a day Mary Valencia, Mary Lacks, MD Taking Active   glucose blood test strip [id number] Yes TEST BLOOD GLUCOSE THREE TIMES DAILY Mary Valencia, Mary Lacks, MD Taking Active   hydrALAZINE (APRESOLINE) 100 MG tablet [id number] Yes Take 1 tablet (100 mg total) by mouth 3 (three) times daily. Mary Valencia, Mary Lacks, MD Taking Active   Insulin NPH Human, Isophane, Karleen [place]) [id number] Yes Inject 2 Doses into the skin. 5 mg in morning, 7 mg at night [provider] Taking Active   labetalol (NORMODYNE) 200 MG tablet [id number] Yes Take 1 tablet (200 mg total) by mouth 3 (three) times daily. Mary Valencia, Mary Lacks, MD Taking Active   losartan (COZAAR) 50 MG tablet  [id number] Yes Take 1 tablet (  50 mg total) by mouth daily.  Patient taking differently: Take 50 mg by mouth in the morning and at bedtime.   Mary Valencia, Mary Lacks, MD Taking Active   mupirocin ointment (BACTROBAN) 2 % [id number] Yes On leg wound w/dressing change qd or bid Mary Valencia, Mary Lacks, MD Taking Active   NIFEdipine (ADALAT CC) 60 MG 24 hr tablet [id number] Yes  [provider] Taking Active   NOVOLOG 100 UNIT/ML injection [id number] Yes Inject 7 Units into the skin in the morning and at bedtime. Mary Valencia, Mary Lacks, MD Taking Active   omeprazole (PRILOSEC) 40 MG capsule [id number] Yes Take 1 capsule (40 mg total) by mouth daily. Mary Valencia, Mary Lacks, MD Taking Active   polyethylene glycol [facility] / GLYCOLAX) packet [id number] Yes Take 17 g by mouth. [provider] Taking Active   PRESCRIPTION MEDICATION [id number] Yes 210 mg 3 (three) times daily before meals.  [provider] Taking Active Self  senna-docusate (SENOKOT-S) 8.6-50 MG tablet [id number] Yes Take by mouth 2 (two) times daily. Take 2 tab twice daily. [provider] Taking Active   talc powder [id number] Yes Apply topically as needed. Under breasts Binnie Rail, MD Taking Active   torsemide (DEMADEX) 20 MG tablet [id number] Yes Take 40 mg by mouth.  [provider] Taking Active Self           Med Note Edison Pace, PHETCHARAT   Wed Mar 28, 2017 11:14 AM) Pt report taking BID  TRUEplus Lancets 33G MISC [id number] Yes Use to help check blood sugar twice a day Mary Valencia, Mary Lacks, MD Taking Active             Patient Active Problem List   Diagnosis Date Noted   Grieving 10/12/2020   Shoulder pain, right 10/11/2020   Hematoma and contusion 08/23/2020   Intertrigo 04/06/2020   Pressure sore 04/06/2020   Acute respiratory failure due to COVID-19 [facility]) 04/05/2020   Foot ulcer ([place]) 12/24/2019   Ankle sprain 12/24/2019   CVA (cerebral vascular accident) ([place]) 09/22/2019   Laceration  of right lower leg 02/26/2019   Liver cirrhosis ([place]) 09/21/2017   CAD (coronary artery disease) 09/21/2017   ESRD (end stage renal disease) on dialysis ([place]) 10/06/2015   Situational mixed anxiety and depressive disorder 10/06/2015   Hyperkalemia 02/01/2013   Edema 10/27/2012   TIA (transient ischemic attack) 03/21/2012   Encephalopathy acute 01/29/2012   Weight loss 01/24/2012   LBP (low back pain) 01/24/2012   Chest pain, atypical 09/25/2011   Vagina bleeding 01/18/2011   ALLERGIC RHINITIS 09/05/2010   TACHYCARDIA 09/05/2010   DYSPNEA 09/05/2010   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     Medication Assistance: {MEDASSISTANCEINFO:25044}  Compliance/Adherence/Medication fill history: Care Gaps: ***  Patient's preferred pharmacy is:  [facility], [address] [place] [address] Phone: [phone] Fax: [phone]  [facility] ([facility]) - [place], [address] [place] [address] Phone: [phone]  Fax: [phone]   Uses pill box? {Yes or If no, why not?:20788} Pt endorses ***% compliance  Care Plan and Follow Up Patient Decision:  {FOLLOWUP:24991}  Plan: {CM FOLLOW UP UJWJ:19147}  ***  Current Barriers:  {pharmacybarriers:24917}  Pharmacist Clinical Goal(s):  Patient will {PHARMACYGOALCHOICES:24921} through  collaboration with PharmD and provider.   Interventions: 1:1 collaboration with Mary Valencia, Mary Lacks, MD regarding development and update of comprehensive plan of care as evidenced by provider attestation and co-signature Inter-disciplinary care team collaboration (see longitudinal plan of care) Comprehensive medication review performed; medication list updated in electronic medical record  Hypertension (BP goal <140/90) -{US controlled/uncontrolled:25276} -Current treatment: Labetalol 272m - 1 tablet 3 times daily  Nifedipine 634m- 1 tablet daily  Losartan 5076m 1 tablet daily  Hydralazine 100m35m1 tablet 3 times daily  Torsemide 20mg80m tablets daily  -Medications previously tried: amlodipine, benazepril, chlorthalidone, clonidine, furosemide, isosorbide mononitrate, metoprolol, nifedipine,   -Current home readings: *** -Current dietary habits: *** -Current exercise habits: *** -{ACTIONS;DENIES/REPORTS:21021675::"Denies"} hypotensive/hypertensive symptoms -Educated on {CCM BP Counseling:25124} -Counseled to monitor BP at home ***, document, and provide log at future appointments -{CCMPHARMDINTERVENTION:25122}  Hyperlipidemia/ History of CVA: (LDL goal < 70) -{US controlled/uncontrolled:25276} Lab Results  Component Value Date   LDLCALC 83 09/21/2017  -Current treatment: Atorvastatin 40mg 36mtablet daily  Aspirin 81mg -35mablet daily  -Medications previously tried: rosuvastatin, pravastatin, lovastatin  -Current dietary patterns: *** -Current exercise habits: *** -Educated on {CCM HLD Counseling:25126} -{CCMPHARMDINTERVENTION:25122}  Diabetes (A1c goal <8%) -{US controlled/uncontrolled:25276} Lab Results  Component Value Date   HGBA1C 8.1 (H) 03/10/2020  -Current medications: NPH insulin - 5 units in AM and 7 units in PM Novolog - 7 units twice daily  -Medications previously tried: lantus, metformin, tradjenta, januvia, glipizide, glimepiride  -Current home  glucose readings fasting glucose: *** post prandial glucose: *** -{ACTIONS;DENIES/REPORTS:21021675::"Denies"} hypoglycemic/hyperglycemic symptoms -Current meal patterns:  breakfast: ***  lunch: ***  dinner: *** snacks: *** drinks: *** -Current exercise: *** -Educated on {CCM DM COUNSELING:25123} -Counseled to check feet daily and get yearly eye exams -{CCMPHARMDINTERVENTION:25122}  End Stage Renal Disease (Goal: Maintenance of kidney function / Prevention of disease progression) -{US controlled/uncontrolled:25276} -last eGFR: 3mL/min7murrent treatment  Calcium acetate, Phosphate binder 667mg/5mL81m67mg dail54mMedications previously tried: calcitriol, sensipar  -{CCMPHARMDINTERVENTION:25122}  GERD/Chronic Constipation (Goal: Prevention of acid reflux/ maintenance of normal bowel movements) -{US controlled/uncontrolled:25276} -Current treatment  Miralax - 17g daily  Senna-docusate 8.6-50mg - 1 tablet twice daily  Omeprazole 40mg - 1 c40mle daily  Calcium Carbonate (Tums) - 1 tablet as needed  -Medications previously tried: pantoprazole  -{CCMPHARMDINTERVENTION:25122}   Health Maintenance -Vaccine gaps: *** -Current therapy:  Calcium-Magnesium-Vitamin D - 1 tablet daily  Diphenhydramine 25mg -1 cap70m daily  -Educated on {ccm supplement counseling:25128} -{CCM Patient satisfied:25129} -{CCMPHARMDINTERVENTION:25122}  Patient Goals/Self-Care Activities Patient will:  - {pharmacypatientgoals:24919}  Follow Up Plan: {CM FOLLOW UP PLAN:22241} WGNF:62130}art Prep = 26 minutes

## 2021-02-18 DIAGNOSIS — N2581 Secondary hyperparathyroidism of renal origin: Secondary | ICD-10-CM | POA: Diagnosis not present

## 2021-02-18 DIAGNOSIS — D631 Anemia in chronic kidney disease: Secondary | ICD-10-CM | POA: Diagnosis not present

## 2021-02-18 DIAGNOSIS — N186 End stage renal disease: Secondary | ICD-10-CM | POA: Diagnosis not present

## 2021-02-19 DIAGNOSIS — N186 End stage renal disease: Secondary | ICD-10-CM | POA: Diagnosis not present

## 2021-02-19 DIAGNOSIS — D631 Anemia in chronic kidney disease: Secondary | ICD-10-CM | POA: Diagnosis not present

## 2021-02-19 DIAGNOSIS — N2581 Secondary hyperparathyroidism of renal origin: Secondary | ICD-10-CM | POA: Diagnosis not present

## 2021-02-20 DIAGNOSIS — N2581 Secondary hyperparathyroidism of renal origin: Secondary | ICD-10-CM | POA: Diagnosis not present

## 2021-02-20 DIAGNOSIS — N186 End stage renal disease: Secondary | ICD-10-CM | POA: Diagnosis not present

## 2021-02-20 DIAGNOSIS — D631 Anemia in chronic kidney disease: Secondary | ICD-10-CM | POA: Diagnosis not present

## 2021-02-21 DIAGNOSIS — N186 End stage renal disease: Secondary | ICD-10-CM | POA: Diagnosis not present

## 2021-02-21 DIAGNOSIS — D631 Anemia in chronic kidney disease: Secondary | ICD-10-CM | POA: Diagnosis not present

## 2021-02-21 DIAGNOSIS — N2581 Secondary hyperparathyroidism of renal origin: Secondary | ICD-10-CM | POA: Diagnosis not present

## 2021-02-22 DIAGNOSIS — D631 Anemia in chronic kidney disease: Secondary | ICD-10-CM | POA: Diagnosis not present

## 2021-02-22 DIAGNOSIS — N2581 Secondary hyperparathyroidism of renal origin: Secondary | ICD-10-CM | POA: Diagnosis not present

## 2021-02-22 DIAGNOSIS — N186 End stage renal disease: Secondary | ICD-10-CM | POA: Diagnosis not present

## 2021-02-23 ENCOUNTER — Telehealth: Payer: Medicaid Other

## 2021-02-23 DIAGNOSIS — N2581 Secondary hyperparathyroidism of renal origin: Secondary | ICD-10-CM | POA: Diagnosis not present

## 2021-02-23 DIAGNOSIS — Z992 Dependence on renal dialysis: Secondary | ICD-10-CM | POA: Diagnosis not present

## 2021-02-23 DIAGNOSIS — D631 Anemia in chronic kidney disease: Secondary | ICD-10-CM | POA: Diagnosis not present

## 2021-02-23 DIAGNOSIS — N186 End stage renal disease: Secondary | ICD-10-CM | POA: Diagnosis not present

## 2021-02-24 ENCOUNTER — Telehealth: Payer: Self-pay | Admitting: Internal Medicine

## 2021-02-24 DIAGNOSIS — D631 Anemia in chronic kidney disease: Secondary | ICD-10-CM | POA: Diagnosis not present

## 2021-02-24 DIAGNOSIS — N186 End stage renal disease: Secondary | ICD-10-CM | POA: Diagnosis not present

## 2021-02-24 DIAGNOSIS — N2581 Secondary hyperparathyroidism of renal origin: Secondary | ICD-10-CM | POA: Diagnosis not present

## 2021-02-24 NOTE — Telephone Encounter (Signed)
Called and left message on machine for next available appointment 9/15 @1 :00pm  Have not been able to speak with patient, phone goes straight to VM every time that I have tried to call, if she is needing me to call her on a different number please make me aware   Linna Hoff

## 2021-02-24 NOTE — Telephone Encounter (Signed)
Patient saying she is still waiting to hear from Beau Fanny left 2 messages last week & she tried calling him back but has not been able to reach him   Please follow up w/ patient [phone]

## 2021-02-25 DIAGNOSIS — N2581 Secondary hyperparathyroidism of renal origin: Secondary | ICD-10-CM | POA: Diagnosis not present

## 2021-02-25 DIAGNOSIS — D631 Anemia in chronic kidney disease: Secondary | ICD-10-CM | POA: Diagnosis not present

## 2021-02-25 DIAGNOSIS — N186 End stage renal disease: Secondary | ICD-10-CM | POA: Diagnosis not present

## 2021-02-26 DIAGNOSIS — N2581 Secondary hyperparathyroidism of renal origin: Secondary | ICD-10-CM | POA: Diagnosis not present

## 2021-02-26 DIAGNOSIS — N186 End stage renal disease: Secondary | ICD-10-CM | POA: Diagnosis not present

## 2021-02-26 DIAGNOSIS — D631 Anemia in chronic kidney disease: Secondary | ICD-10-CM | POA: Diagnosis not present

## 2021-02-27 DIAGNOSIS — N2581 Secondary hyperparathyroidism of renal origin: Secondary | ICD-10-CM | POA: Diagnosis not present

## 2021-02-27 DIAGNOSIS — D631 Anemia in chronic kidney disease: Secondary | ICD-10-CM | POA: Diagnosis not present

## 2021-02-27 DIAGNOSIS — N186 End stage renal disease: Secondary | ICD-10-CM | POA: Diagnosis not present

## 2021-02-28 DIAGNOSIS — N2581 Secondary hyperparathyroidism of renal origin: Secondary | ICD-10-CM | POA: Diagnosis not present

## 2021-02-28 DIAGNOSIS — D631 Anemia in chronic kidney disease: Secondary | ICD-10-CM | POA: Diagnosis not present

## 2021-02-28 DIAGNOSIS — N186 End stage renal disease: Secondary | ICD-10-CM | POA: Diagnosis not present

## 2021-03-01 DIAGNOSIS — N186 End stage renal disease: Secondary | ICD-10-CM | POA: Diagnosis not present

## 2021-03-01 DIAGNOSIS — D631 Anemia in chronic kidney disease: Secondary | ICD-10-CM | POA: Diagnosis not present

## 2021-03-01 DIAGNOSIS — E119 Type 2 diabetes mellitus without complications: Secondary | ICD-10-CM | POA: Diagnosis not present

## 2021-03-01 DIAGNOSIS — N2581 Secondary hyperparathyroidism of renal origin: Secondary | ICD-10-CM | POA: Diagnosis not present

## 2021-03-02 DIAGNOSIS — N2581 Secondary hyperparathyroidism of renal origin: Secondary | ICD-10-CM | POA: Diagnosis not present

## 2021-03-02 DIAGNOSIS — N186 End stage renal disease: Secondary | ICD-10-CM | POA: Diagnosis not present

## 2021-03-02 DIAGNOSIS — D631 Anemia in chronic kidney disease: Secondary | ICD-10-CM | POA: Diagnosis not present

## 2021-03-03 DIAGNOSIS — N2581 Secondary hyperparathyroidism of renal origin: Secondary | ICD-10-CM | POA: Diagnosis not present

## 2021-03-03 DIAGNOSIS — N186 End stage renal disease: Secondary | ICD-10-CM | POA: Diagnosis not present

## 2021-03-03 DIAGNOSIS — D631 Anemia in chronic kidney disease: Secondary | ICD-10-CM | POA: Diagnosis not present

## 2021-03-04 DIAGNOSIS — D631 Anemia in chronic kidney disease: Secondary | ICD-10-CM | POA: Diagnosis not present

## 2021-03-04 DIAGNOSIS — N186 End stage renal disease: Secondary | ICD-10-CM | POA: Diagnosis not present

## 2021-03-04 DIAGNOSIS — N2581 Secondary hyperparathyroidism of renal origin: Secondary | ICD-10-CM | POA: Diagnosis not present

## 2021-03-05 DIAGNOSIS — N2581 Secondary hyperparathyroidism of renal origin: Secondary | ICD-10-CM | POA: Diagnosis not present

## 2021-03-05 DIAGNOSIS — D631 Anemia in chronic kidney disease: Secondary | ICD-10-CM | POA: Diagnosis not present

## 2021-03-05 DIAGNOSIS — N186 End stage renal disease: Secondary | ICD-10-CM | POA: Diagnosis not present

## 2021-03-06 DIAGNOSIS — N186 End stage renal disease: Secondary | ICD-10-CM | POA: Diagnosis not present

## 2021-03-06 DIAGNOSIS — D631 Anemia in chronic kidney disease: Secondary | ICD-10-CM | POA: Diagnosis not present

## 2021-03-06 DIAGNOSIS — N2581 Secondary hyperparathyroidism of renal origin: Secondary | ICD-10-CM | POA: Diagnosis not present

## 2021-03-07 ENCOUNTER — Telehealth: Payer: Self-pay

## 2021-03-07 DIAGNOSIS — N2581 Secondary hyperparathyroidism of renal origin: Secondary | ICD-10-CM | POA: Diagnosis not present

## 2021-03-07 DIAGNOSIS — N186 End stage renal disease: Secondary | ICD-10-CM | POA: Diagnosis not present

## 2021-03-07 DIAGNOSIS — D631 Anemia in chronic kidney disease: Secondary | ICD-10-CM | POA: Diagnosis not present

## 2021-03-07 NOTE — Progress Notes (Signed)
    Chronic Care Management Pharmacy Assistant   Name: KAYSI OURADA  MRN: [id number] DOB: Nov 01, 1955  Opened in error.

## 2021-03-08 DIAGNOSIS — D631 Anemia in chronic kidney disease: Secondary | ICD-10-CM | POA: Diagnosis not present

## 2021-03-08 DIAGNOSIS — N186 End stage renal disease: Secondary | ICD-10-CM | POA: Diagnosis not present

## 2021-03-08 DIAGNOSIS — N2581 Secondary hyperparathyroidism of renal origin: Secondary | ICD-10-CM | POA: Diagnosis not present

## 2021-03-09 DIAGNOSIS — D631 Anemia in chronic kidney disease: Secondary | ICD-10-CM | POA: Diagnosis not present

## 2021-03-09 DIAGNOSIS — N2581 Secondary hyperparathyroidism of renal origin: Secondary | ICD-10-CM | POA: Diagnosis not present

## 2021-03-09 DIAGNOSIS — N186 End stage renal disease: Secondary | ICD-10-CM | POA: Diagnosis not present

## 2021-03-10 ENCOUNTER — Telehealth: Payer: Medicaid Other

## 2021-03-10 DIAGNOSIS — D631 Anemia in chronic kidney disease: Secondary | ICD-10-CM | POA: Diagnosis not present

## 2021-03-10 DIAGNOSIS — N186 End stage renal disease: Secondary | ICD-10-CM | POA: Diagnosis not present

## 2021-03-10 DIAGNOSIS — N2581 Secondary hyperparathyroidism of renal origin: Secondary | ICD-10-CM | POA: Diagnosis not present

## 2021-03-10 NOTE — Progress Notes (Deleted)
Chronic Care Management Pharmacy Note  03/10/2021 Name:  Mary Valencia MRN:  [id number] DOB:  1956/06/06  Summary: ***  Recommendations/Changes made from today's visit: ***  Plan: ***  Subjective: Mary Valencia is an 65 y.o. year old female who is a primary patient of Plotnikov, Evie Lacks, MD.  The CCM team was consulted for assistance with disease management and care coordination needs.    Engaged with patient by telephone for initial visit in response to provider referral for pharmacy case management and/or care coordination services.   Consent to Services:  The patient was given the following information about Chronic Care Management services today, agreed to services, and gave verbal consent: 1. CCM service includes personalized support from designated clinical staff supervised by the primary care provider, including individualized plan of care and coordination with other care providers 2. 24/7 contact phone numbers for assistance for urgent and routine care needs. 3. Service will only be billed when office clinical staff spend 20 minutes or more in a month to coordinate care. 4. Only one practitioner may furnish and bill the service in a calendar month. 5.The patient may stop CCM services at any time (effective at the end of the month) by phone call to the office staff. 6. The patient will be responsible for cost sharing (co-pay) of up to 20% of the service fee (after annual deductible is met). Patient agreed to services and consent obtained.  Patient Care Team: Plotnikov, Evie Lacks, MD as PCP - General Ninfa Linden Lind Guest, MD (Orthopedic Surgery) Boyd Kerbs, MD as Referring Physician (Neurology) Mezer, Nadara Mustard, MD (Obstetrics and Gynecology) Thereasa Distance, MD as Referring Physician (Nephrology) Curly Rim, MD as Referring Physician (Internal Medicine) Tomasa Blase, [facility] as Pharmacist (Pharmacist)  10/11/20-Aleksey V. Plotnikov, MD (PCP) 3 month follow  up. Ambulatory referral to Home Health. Follow up in 3 months.   08/23/20-Aleksei V. Plotnikoc, MD (PCP) 3 month follow up. Follow up in 3 months.   Recent consult visits:  10/12/20-Jessica Young Berry ([facility]    09/28/20 Ricard Dillon, MD ([facility])    Hospital visits:  None in previous 6 months  Objective:  Lab Results  Component Value Date   CREATININE 13.49 (HH) 09/21/2017   BUN 66 (H) 09/21/2017   GFR 3.57 (LL) 09/21/2017   GFRNONAA 52 (L) 01/29/2012   GFRAA 61 (L) 01/29/2012   NA 141 09/21/2017   K 4.9 09/21/2017   CALCIUM 8.5 09/21/2017   CO2 27 09/21/2017   GLUCOSE 279 (H) 09/21/2017    Lab Results  Component Value Date/Time   HGBA1C 7.1 (H) 09/21/2017 10:55 AM   HGBA1C 8.0 03/29/2017 12:00 AM   HGBA1C 14.1 (H) 12/03/2013 09:21 AM   GFR 3.57 (LL) 09/21/2017 10:55 AM   GFR 63.28 12/03/2013 09:21 AM    Last diabetic Eye exam:  No results found for: HMDIABEYEEXA  Last diabetic Foot exam:  No results found for: HMDIABFOOTEX   Lab Results  Component Value Date   CHOL 161 09/21/2017   HDL 63.60 09/21/2017   LDLCALC 83 09/21/2017   LDLDIRECT 207.8 09/29/2010   TRIG 72.0 09/21/2017   CHOLHDL 3 09/21/2017    Hepatic Function Latest Ref Rng & Units 09/21/2017 09/01/2017 08/29/2017  Total Protein 6.0 - 8.3 g/dL 6.1 - -  Albumin 3.5 - 5.2 g/dL 3.2(L) 3.4 -  AST 0 - 37 U/L 21 58 58(A)  ALT 0 - 35 U/L 6 26 26  Alk Phosphatase 39 - 117 U/L 133(H) 164 -  Total Bilirubin 0.2 - 1.2 mg/dL 0.3 - -  Bilirubin, Direct 0.0 - 0.3 mg/dL 0.1 - -    Lab Results  Component Value Date/Time   TSH 2.81 09/21/2017 10:55 AM   TSH 1.743 01/29/2012 07:36 AM    CBC Latest Ref Rng & Units 09/01/2017 08/29/2017 03/29/2017  WBC - - 7.7 9.3  Hemoglobin - 29.1 9.7(A) 7.4(A)  Hematocrit 36 - 46 - 30(A) 23(A)  Platelets 150 - 399 - 203 318    No results found for: VD25OH  Clinical ASCVD: {YES/NO:21197} The ASCVD Risk score  (Arnett DK, et al., 2019) failed to calculate for the following reasons:   The patient has a prior MI or stroke diagnosis    Depression screen [facility] 2/9 05/24/2020 04/06/2020 04/04/2019  Decreased Interest 0 0 0  Down, Depressed, Hopeless 0 0 1  PHQ - 2 Score 0 0 1  Altered sleeping - - 1  Tired, decreased energy - - 1  Change in appetite - - 0  Feeling bad or failure about yourself  - - 0  Trouble concentrating - - 0  Moving slowly or fidgety/restless - - 0  Suicidal thoughts - - 0  PHQ-9 Score - - 3  Difficult doing work/chores - - Somewhat difficult     ***Other: (CHADS2VASc if Afib, MMRC or CAT for COPD, ACT, DEXA)  Social History   Tobacco Use  Smoking Status Never  Smokeless Tobacco Never   BP Readings from Last 3 Encounters:  10/11/20 130/62  08/23/20 98/62  07/21/20 (!) 134/50   Pulse Readings from Last 3 Encounters:  10/11/20 83  08/23/20 78  07/21/20 85   Wt Readings from Last 3 Encounters:  10/11/20 120 lb 3.2 oz (54.5 kg)  08/23/20 126 lb 3.2 oz (57.2 kg)  07/21/20 130 lb 14.4 oz (59.4 kg)   BMI Readings from Last 3 Encounters:  10/11/20 21.98 kg/m  08/23/20 23.08 kg/m  07/21/20 23.94 kg/m    Assessment/Interventions: Review of patient past medical history, allergies, medications, health status, including review of consultants reports, laboratory and other test data, was performed as part of comprehensive evaluation and provision of chronic care management services.   SDOH:  (Social Determinants of Health) assessments and interventions performed: {yes/no:20286}  SDOH Screenings   Alcohol Screen: Low Risk    Last Alcohol Screening Score (AUDIT): 0  Depression (PHQ2-9): Low Risk    PHQ-2 Score: 0  Financial Resource Strain: Low Risk    Difficulty of Paying Living Expenses: Not hard at all  Food Insecurity: No Food Insecurity   Worried About Charity fundraiser in the Last Year: Never true   Ran Out of Food in the Last Year: Never true  Housing:  Low Risk    Last Housing Risk Score: 0  Physical Activity: Inactive   Days of Exercise per Week: 0 days   Minutes of Exercise per Session: 0 min  Social Connections: Socially Isolated   Frequency of Communication with Friends and Family: More than three times a week   Frequency of Social Gatherings with Friends and Family: Once a week   Attends Religious Services: Never   Marine scientist or Organizations: No   Attends Music therapist: Never   Marital Status: Never married  Stress: No Stress Concern Present   Feeling of Stress : Not at all  Tobacco Use: Low Risk    Smoking Tobacco Use: Never  Smokeless Tobacco Use: Never  Transportation Needs: No Transportation Needs   Lack of Transportation (Medical): No   Lack of Transportation (Non-Medical): No    CCM Care Plan  Allergies  Allergen Reactions   Betadine [Povidone Iodine] Other (See Comments)    Pt says it burns   Clonidine Derivatives     Dropped BP   Gabapentin     A bad reaction - confused   Iodine Hives   Penicillins Swelling   Sulfonamide Derivatives Hives   Tetracyclines & Related Nausea And Vomiting   Naproxen Rash    Medications Reviewed Today     Reviewed by Cassandria Anger, MD (Physician) on 10/11/20 at 1144  Med List Status: <None>   Medication Order Taking? Sig Documenting Provider Last Dose Status Informant  Alcohol Swabs (B-D SINGLE USE SWABS REGULAR) PADS [id number] Yes Use as directed to clean site to check blood sugars Plotnikov, Evie Lacks, MD Taking Active   atorvastatin (LIPITOR) 40 MG tablet [id number] Yes Take 1 tablet (40 mg total) by mouth daily. Follow-up appt due in Feb w/Lipid check must see provider for future refills Plotnikov, Evie Lacks, MD Taking Active   Blood Glucose Monitoring Suppl (TRUE METRIX METER) w/Device KIT [id number] Yes USE AS DIRECTED TO CHECK BLOOD SUGAR Plotnikov, Evie Lacks, MD Taking Active   calcium acetate, Phos Binder, (PHOSLYRA) 667 MG/5ML SOLN  [id number] Yes Take 667 mg by mouth 3 (three) times daily with meals. [provider] Taking Active Self  calcium carbonate (TUMS - DOSED IN MG ELEMENTAL CALCIUM) 500 MG chewable tablet [id number] Yes  [provider] Taking Active   CALCIUM-MAGNESIUM-VITAMIN D PO [id number] Yes Take 1 tablet by mouth daily. [provider] Taking Active            Med Note Rosita Fire, Rogene [place]   Wed Oct 06, 2015  1:55 PM) Received from: [place]: Take by mouth.  Continuous Blood Gluc Receiver (FREESTYLE LIBRE 14 DAY READER) [place] [id number] Yes 1 Units by Does not apply route daily. Plotnikov, Evie Lacks, MD Taking Active   Continuous Blood Gluc Sensor (FREESTYLE LIBRE 14 DAY SENSOR) [place] [id number] Yes 1 Units by Does not apply route every 14 (fourteen) days. Plotnikov, Evie Lacks, MD Taking Active   diphenhydrAMINE (BENADRYL) 25 mg capsule [id number] Yes Take 25 mg by mouth as needed. [provider] Taking Active            Med Note Rosita Fire, Rogene [place]   Wed Oct 06, 2015  1:55 PM) Received from: [place]: Take 25 mg by mouth.  glucose blood (TRUE METRIX BLOOD GLUCOSE TEST) test strip [id number] Yes Use to check blood sugars twice a day Plotnikov, Evie Lacks, MD Taking Active   glucose blood test strip [id number] Yes TEST BLOOD GLUCOSE THREE TIMES DAILY Plotnikov, Evie Lacks, MD Taking Active   hydrALAZINE (APRESOLINE) 100 MG tablet [id number] Yes Take 1 tablet (100 mg total) by mouth 3 (three) times daily. Plotnikov, Evie Lacks, MD Taking Active   Insulin NPH Human, Isophane, Karleen [place] ) [id number] Yes Inject 2 Doses into the skin. 5 mg in morning, 7 mg at night [provider] Taking Active   labetalol (NORMODYNE) 200 MG tablet [id number] Yes Take 1 tablet (200 mg total) by mouth 3 (three) times daily. Plotnikov, Evie Lacks, MD Taking Active   losartan (COZAAR) 50 MG tablet [id number] Yes Take 1 tablet (50 mg total) by mouth daily.  Patient taking  differently: Take  50 mg by mouth in the morning and at bedtime.   Plotnikov, Evie Lacks, MD Taking Active   mupirocin ointment (BACTROBAN) 2 % [id number] Yes On leg wound w/dressing change qd or bid Plotnikov, Evie Lacks, MD Taking Active   NIFEdipine (ADALAT CC) 60 MG 24 hr tablet [id number] Yes  [provider] Taking Active   NOVOLOG 100 UNIT/ML injection [id number] Yes Inject 7 Units into the skin in the morning and at bedtime. Plotnikov, Evie Lacks, MD Taking Active   omeprazole (PRILOSEC) 40 MG capsule [id number] Yes Take 1 capsule (40 mg total) by mouth daily. Plotnikov, Evie Lacks, MD Taking Active   polyethylene glycol [facility] / GLYCOLAX) packet [id number] Yes Take 17 g by mouth. [provider] Taking Active   PRESCRIPTION MEDICATION [id number] Yes 210 mg 3 (three) times daily before meals.  [provider] Taking Active Self  senna-docusate (SENOKOT-S) 8.6-50 MG tablet [id number] Yes Take by mouth 2 (two) times daily. Take 2 tab twice daily. [provider] Taking Active   talc powder [id number] Yes Apply topically as needed. Under breasts Binnie Rail, MD Taking Active   torsemide (DEMADEX) 20 MG tablet [id number] Yes Take 40 mg by mouth.  [provider] Taking Active Self           Med Note Edison Pace, PHETCHARAT   Wed Mar 28, 2017 11:14 AM) Pt report taking BID  TRUEplus Lancets 33G MISC [id number] Yes Use to help check blood sugar twice a day Plotnikov, Evie Lacks, MD Taking Active             Patient Active Problem List   Diagnosis Date Noted   Grieving 10/12/2020   Shoulder pain, right 10/11/2020   Hematoma and contusion 08/23/2020   Intertrigo 04/06/2020   Pressure sore 04/06/2020   Acute respiratory failure due to COVID-19 [facility]) 04/05/2020   Foot ulcer ([place]) 12/24/2019   Ankle sprain 12/24/2019   CVA (cerebral vascular accident) ([place]) 09/22/2019   Laceration of right lower leg 02/26/2019   Liver cirrhosis ([place]) 09/21/2017   CAD  (coronary artery disease) 09/21/2017   ESRD (end stage renal disease) on dialysis ([place]) 10/06/2015   Situational mixed anxiety and depressive disorder 10/06/2015   Hyperkalemia 02/01/2013   Edema 10/27/2012   TIA (transient ischemic attack) 03/21/2012   Encephalopathy acute 01/29/2012   Weight loss 01/24/2012   LBP (low back pain) 01/24/2012   Chest pain, atypical 09/25/2011   Vagina bleeding 01/18/2011   ALLERGIC RHINITIS 09/05/2010   TACHYCARDIA 09/05/2010   DYSPNEA 09/05/2010   Diabetes mellitus type 2 with complications ([place]) 24/26/8341   Dyslipidemia 02/19/2007   Renal hypertension 02/19/2007   GERD 02/19/2007    Immunization History  Administered Date(s) Administered   Fluad Quad(high Dose 65+) 04/26/2020   Influenza Whole 05/16/2005   Influenza-Unspecified 03/26/2018   Pneumococcal Polysaccharide-23 10/08/2016   Td 09/26/2004, 08/25/2011, 02/24/2017    Conditions to be addressed/monitored:  {USCCMDZASSESSMENTOPTIONS:23563}  There are no care plans that you recently modified to display for this patient.     Medication Assistance: {MEDASSISTANCEINFO:25044}  Compliance/Adherence/Medication fill history: Care Gaps: ***  Patient's preferred pharmacy is:  [facility], [place] [address] Phone: [phone] Fax: [phone]  [facility] - [address], [place] [address] Phone: [phone] Fax: [phone]   Uses pill box? {Yes or If no,  why not?:20788} Pt endorses ***% compliance  Care Plan and Follow Up Patient Decision:  {FOLLOWUP:24991}  Plan: {CM FOLLOW UP CCEQ:73192}  ***  Current Barriers:  {pharmacybarriers:24917}  Pharmacist Clinical Goal(s):  Patient will {PHARMACYGOALCHOICES:24921} through collaboration with PharmD and provider.   Interventions: 1:1  collaboration with Plotnikov, Evie Lacks, MD regarding development and update of comprehensive plan of care as evidenced by provider attestation and co-signature Inter-disciplinary care team collaboration (see longitudinal plan of care) Comprehensive medication review performed; medication list updated in electronic medical record  Hypertension (BP goal <140/90) -{US controlled/uncontrolled:25276} -Current treatment: Labetalol 268m - 1 tablet 3 times daily  Nifedipine 651m- 1 tablet daily  Losartan 5077m 1 tablet daily  Hydralazine 100m48m1 tablet 3 times daily  Torsemide 20mg83m tablets daily  -Medications previously tried: amlodipine, benazepril, chlorthalidone, clonidine, furosemide, isosorbide mononitrate, metoprolol, nifedipine,   -Current home readings: *** -Current dietary habits: *** -Current exercise habits: *** -{ACTIONS;DENIES/REPORTS:21021675::"Denies"} hypotensive/hypertensive symptoms -Educated on {CCM BP Counseling:25124} -Counseled to monitor BP at home ***, document, and provide log at future appointments -{CCMPHARMDINTERVENTION:25122}  Hyperlipidemia/ History of CVA: (LDL goal < 70) -{US controlled/uncontrolled:25276} Lab Results  Component Value Date   LDLCALC 83 09/21/2017  -Current treatment: Atorvastatin 40mg 54mtablet daily  Aspirin 81mg -1mablet daily  -Medications previously tried: rosuvastatin, pravastatin, lovastatin  -Current dietary patterns: *** -Current exercise habits: *** -Educated on {CCM HLD Counseling:25126} -{CCMPHARMDINTERVENTION:25122}  Diabetes (A1c goal <8%) -{US controlled/uncontrolled:25276} Lab Results  Component Value Date   HGBA1C 7.1 (H) 09/21/2017  -Current medications: NPH insulin - 5 units in AM and 7 units in PM Novolog - 7 units twice daily  -Medications previously tried: lantus, metformin, tradjenta, januvia, glipizide, glimepiride  -Current home glucose readings fasting glucose: *** post prandial glucose:  *** -{ACTIONS;DENIES/REPORTS:21021675::"Denies"} hypoglycemic/hyperglycemic symptoms -Current meal patterns:  breakfast: ***  lunch: ***  dinner: *** snacks: *** drinks: *** -Current exercise: *** -Educated on {CCM DM COUNSELING:25123} -Counseled to check feet daily and get yearly eye exams -{CCMPHARMDINTERVENTION:25122}  End Stage Renal Disease (Goal: Maintenance of kidney function / Prevention of disease progression) -{US controlled/uncontrolled:25276} -last eGFR: 3mL/min73murrent treatment  Calcium acetate, Phosphate binder 667mg/5mL56m67mg dail36mMedications previously tried: calcitriol, sensipar  -{CCMPHARMDINTERVENTION:25122}  GERD/Chronic Constipation (Goal: Prevention of acid reflux/ maintenance of normal bowel movements) -{US controlled/uncontrolled:25276} -Current treatment  Miralax - 17g daily  Senna-docusate 8.6-50mg - 1 tablet twice daily  Omeprazole 40mg - 1 c63mle daily  Calcium Carbonate (Tums) - 1 tablet as needed  -Medications previously tried: pantoprazole  -{CCMPHARMDINTERVENTION:25122}   Health Maintenance -Vaccine gaps: *** -Current therapy:  Calcium-Magnesium-Vitamin D - 1 tablet daily  Diphenhydramine 25mg -1 cap75m daily  -Educated on {ccm supplement counseling:25128} -{CCM Patient satisfied:25129} -{CCMPHARMDINTERVENTION:25122}  Patient Goals/Self-Care Activities Patient will:  - {pharmacypatientgoals:24919}  Follow Up Plan: {CM FOLLOW UP PLAN:22241}YPOJ:65427}

## 2021-03-11 DIAGNOSIS — N186 End stage renal disease: Secondary | ICD-10-CM | POA: Diagnosis not present

## 2021-03-11 DIAGNOSIS — N2581 Secondary hyperparathyroidism of renal origin: Secondary | ICD-10-CM | POA: Diagnosis not present

## 2021-03-11 DIAGNOSIS — D631 Anemia in chronic kidney disease: Secondary | ICD-10-CM | POA: Diagnosis not present

## 2021-03-12 DIAGNOSIS — N2581 Secondary hyperparathyroidism of renal origin: Secondary | ICD-10-CM | POA: Diagnosis not present

## 2021-03-12 DIAGNOSIS — N186 End stage renal disease: Secondary | ICD-10-CM | POA: Diagnosis not present

## 2021-03-12 DIAGNOSIS — D631 Anemia in chronic kidney disease: Secondary | ICD-10-CM | POA: Diagnosis not present

## 2021-03-13 DIAGNOSIS — N2581 Secondary hyperparathyroidism of renal origin: Secondary | ICD-10-CM | POA: Diagnosis not present

## 2021-03-13 DIAGNOSIS — D631 Anemia in chronic kidney disease: Secondary | ICD-10-CM | POA: Diagnosis not present

## 2021-03-13 DIAGNOSIS — N186 End stage renal disease: Secondary | ICD-10-CM | POA: Diagnosis not present

## 2021-03-14 DIAGNOSIS — N2581 Secondary hyperparathyroidism of renal origin: Secondary | ICD-10-CM | POA: Diagnosis not present

## 2021-03-14 DIAGNOSIS — D631 Anemia in chronic kidney disease: Secondary | ICD-10-CM | POA: Diagnosis not present

## 2021-03-14 DIAGNOSIS — N186 End stage renal disease: Secondary | ICD-10-CM | POA: Diagnosis not present

## 2021-03-15 DIAGNOSIS — N2581 Secondary hyperparathyroidism of renal origin: Secondary | ICD-10-CM | POA: Diagnosis not present

## 2021-03-15 DIAGNOSIS — N186 End stage renal disease: Secondary | ICD-10-CM | POA: Diagnosis not present

## 2021-03-15 DIAGNOSIS — D631 Anemia in chronic kidney disease: Secondary | ICD-10-CM | POA: Diagnosis not present

## 2021-03-16 DIAGNOSIS — N186 End stage renal disease: Secondary | ICD-10-CM | POA: Diagnosis not present

## 2021-03-16 DIAGNOSIS — D631 Anemia in chronic kidney disease: Secondary | ICD-10-CM | POA: Diagnosis not present

## 2021-03-16 DIAGNOSIS — N2581 Secondary hyperparathyroidism of renal origin: Secondary | ICD-10-CM | POA: Diagnosis not present

## 2021-03-17 DIAGNOSIS — N2581 Secondary hyperparathyroidism of renal origin: Secondary | ICD-10-CM | POA: Diagnosis not present

## 2021-03-17 DIAGNOSIS — D631 Anemia in chronic kidney disease: Secondary | ICD-10-CM | POA: Diagnosis not present

## 2021-03-17 DIAGNOSIS — N186 End stage renal disease: Secondary | ICD-10-CM | POA: Diagnosis not present

## 2021-03-18 DIAGNOSIS — N186 End stage renal disease: Secondary | ICD-10-CM | POA: Diagnosis not present

## 2021-03-18 DIAGNOSIS — N2581 Secondary hyperparathyroidism of renal origin: Secondary | ICD-10-CM | POA: Diagnosis not present

## 2021-03-18 DIAGNOSIS — D631 Anemia in chronic kidney disease: Secondary | ICD-10-CM | POA: Diagnosis not present

## 2021-03-19 DIAGNOSIS — D631 Anemia in chronic kidney disease: Secondary | ICD-10-CM | POA: Diagnosis not present

## 2021-03-19 DIAGNOSIS — N2581 Secondary hyperparathyroidism of renal origin: Secondary | ICD-10-CM | POA: Diagnosis not present

## 2021-03-19 DIAGNOSIS — N186 End stage renal disease: Secondary | ICD-10-CM | POA: Diagnosis not present

## 2021-03-20 DIAGNOSIS — N2581 Secondary hyperparathyroidism of renal origin: Secondary | ICD-10-CM | POA: Diagnosis not present

## 2021-03-20 DIAGNOSIS — D631 Anemia in chronic kidney disease: Secondary | ICD-10-CM | POA: Diagnosis not present

## 2021-03-20 DIAGNOSIS — N186 End stage renal disease: Secondary | ICD-10-CM | POA: Diagnosis not present

## 2021-03-21 DIAGNOSIS — E118 Type 2 diabetes mellitus with unspecified complications: Secondary | ICD-10-CM | POA: Diagnosis not present

## 2021-03-21 DIAGNOSIS — N186 End stage renal disease: Secondary | ICD-10-CM | POA: Diagnosis not present

## 2021-03-21 DIAGNOSIS — N2581 Secondary hyperparathyroidism of renal origin: Secondary | ICD-10-CM | POA: Diagnosis not present

## 2021-03-21 DIAGNOSIS — D631 Anemia in chronic kidney disease: Secondary | ICD-10-CM | POA: Diagnosis not present

## 2021-03-22 DIAGNOSIS — N2581 Secondary hyperparathyroidism of renal origin: Secondary | ICD-10-CM | POA: Diagnosis not present

## 2021-03-22 DIAGNOSIS — N186 End stage renal disease: Secondary | ICD-10-CM | POA: Diagnosis not present

## 2021-03-22 DIAGNOSIS — D631 Anemia in chronic kidney disease: Secondary | ICD-10-CM | POA: Diagnosis not present

## 2021-03-23 DIAGNOSIS — D631 Anemia in chronic kidney disease: Secondary | ICD-10-CM | POA: Diagnosis not present

## 2021-03-23 DIAGNOSIS — N2581 Secondary hyperparathyroidism of renal origin: Secondary | ICD-10-CM | POA: Diagnosis not present

## 2021-03-23 DIAGNOSIS — N186 End stage renal disease: Secondary | ICD-10-CM | POA: Diagnosis not present

## 2021-03-24 DIAGNOSIS — N186 End stage renal disease: Secondary | ICD-10-CM | POA: Diagnosis not present

## 2021-03-24 DIAGNOSIS — D631 Anemia in chronic kidney disease: Secondary | ICD-10-CM | POA: Diagnosis not present

## 2021-03-24 DIAGNOSIS — N2581 Secondary hyperparathyroidism of renal origin: Secondary | ICD-10-CM | POA: Diagnosis not present

## 2021-03-25 DIAGNOSIS — Z992 Dependence on renal dialysis: Secondary | ICD-10-CM | POA: Diagnosis not present

## 2021-03-25 DIAGNOSIS — N186 End stage renal disease: Secondary | ICD-10-CM | POA: Diagnosis not present

## 2021-03-25 DIAGNOSIS — N2581 Secondary hyperparathyroidism of renal origin: Secondary | ICD-10-CM | POA: Diagnosis not present

## 2021-03-25 DIAGNOSIS — D631 Anemia in chronic kidney disease: Secondary | ICD-10-CM | POA: Diagnosis not present

## 2021-03-26 DIAGNOSIS — N186 End stage renal disease: Secondary | ICD-10-CM | POA: Diagnosis not present

## 2021-03-26 DIAGNOSIS — Z23 Encounter for immunization: Secondary | ICD-10-CM | POA: Diagnosis not present

## 2021-03-26 DIAGNOSIS — N2581 Secondary hyperparathyroidism of renal origin: Secondary | ICD-10-CM | POA: Diagnosis not present

## 2021-03-26 DIAGNOSIS — D631 Anemia in chronic kidney disease: Secondary | ICD-10-CM | POA: Diagnosis not present

## 2021-03-27 DIAGNOSIS — D631 Anemia in chronic kidney disease: Secondary | ICD-10-CM | POA: Diagnosis not present

## 2021-03-27 DIAGNOSIS — N2581 Secondary hyperparathyroidism of renal origin: Secondary | ICD-10-CM | POA: Diagnosis not present

## 2021-03-27 DIAGNOSIS — Z23 Encounter for immunization: Secondary | ICD-10-CM | POA: Diagnosis not present

## 2021-03-27 DIAGNOSIS — N186 End stage renal disease: Secondary | ICD-10-CM | POA: Diagnosis not present

## 2021-03-28 DIAGNOSIS — N186 End stage renal disease: Secondary | ICD-10-CM | POA: Diagnosis not present

## 2021-03-28 DIAGNOSIS — Z23 Encounter for immunization: Secondary | ICD-10-CM | POA: Diagnosis not present

## 2021-03-28 DIAGNOSIS — D631 Anemia in chronic kidney disease: Secondary | ICD-10-CM | POA: Diagnosis not present

## 2021-03-28 DIAGNOSIS — N2581 Secondary hyperparathyroidism of renal origin: Secondary | ICD-10-CM | POA: Diagnosis not present

## 2021-03-29 ENCOUNTER — Telehealth: Payer: Self-pay | Admitting: Internal Medicine

## 2021-03-29 DIAGNOSIS — D631 Anemia in chronic kidney disease: Secondary | ICD-10-CM | POA: Diagnosis not present

## 2021-03-29 DIAGNOSIS — E119 Type 2 diabetes mellitus without complications: Secondary | ICD-10-CM | POA: Diagnosis not present

## 2021-03-29 DIAGNOSIS — N2581 Secondary hyperparathyroidism of renal origin: Secondary | ICD-10-CM | POA: Diagnosis not present

## 2021-03-29 DIAGNOSIS — N186 End stage renal disease: Secondary | ICD-10-CM | POA: Diagnosis not present

## 2021-03-29 DIAGNOSIS — Z23 Encounter for immunization: Secondary | ICD-10-CM | POA: Diagnosis not present

## 2021-03-29 NOTE — Telephone Encounter (Signed)
Place form on MD desk in blue folder for completion.Marland KitchenJohny Valencia

## 2021-03-29 NOTE — Telephone Encounter (Signed)
Type of form received - Physician Request for CAP Services  Form placed in- Provider mailbox  Additional instructions from the patient- email son when complete @ [email or web address]. Can also call son @ [phone].    Reminded patient's son that forms take 7-10 business days. Asks that forms be completed ASAP.

## 2021-03-30 DIAGNOSIS — N2581 Secondary hyperparathyroidism of renal origin: Secondary | ICD-10-CM | POA: Diagnosis not present

## 2021-03-30 DIAGNOSIS — D631 Anemia in chronic kidney disease: Secondary | ICD-10-CM | POA: Diagnosis not present

## 2021-03-30 DIAGNOSIS — Z23 Encounter for immunization: Secondary | ICD-10-CM | POA: Diagnosis not present

## 2021-03-30 DIAGNOSIS — N186 End stage renal disease: Secondary | ICD-10-CM | POA: Diagnosis not present

## 2021-03-31 DIAGNOSIS — D631 Anemia in chronic kidney disease: Secondary | ICD-10-CM | POA: Diagnosis not present

## 2021-03-31 DIAGNOSIS — N2581 Secondary hyperparathyroidism of renal origin: Secondary | ICD-10-CM | POA: Diagnosis not present

## 2021-03-31 DIAGNOSIS — N186 End stage renal disease: Secondary | ICD-10-CM | POA: Diagnosis not present

## 2021-03-31 DIAGNOSIS — Z23 Encounter for immunization: Secondary | ICD-10-CM | POA: Diagnosis not present

## 2021-04-01 DIAGNOSIS — N2581 Secondary hyperparathyroidism of renal origin: Secondary | ICD-10-CM | POA: Diagnosis not present

## 2021-04-01 DIAGNOSIS — D631 Anemia in chronic kidney disease: Secondary | ICD-10-CM | POA: Diagnosis not present

## 2021-04-01 DIAGNOSIS — N186 End stage renal disease: Secondary | ICD-10-CM | POA: Diagnosis not present

## 2021-04-01 DIAGNOSIS — Z23 Encounter for immunization: Secondary | ICD-10-CM | POA: Diagnosis not present

## 2021-04-01 NOTE — Telephone Encounter (Signed)
Okay.  Thanks.

## 2021-04-02 DIAGNOSIS — N186 End stage renal disease: Secondary | ICD-10-CM | POA: Diagnosis not present

## 2021-04-02 DIAGNOSIS — N2581 Secondary hyperparathyroidism of renal origin: Secondary | ICD-10-CM | POA: Diagnosis not present

## 2021-04-02 DIAGNOSIS — D631 Anemia in chronic kidney disease: Secondary | ICD-10-CM | POA: Diagnosis not present

## 2021-04-02 DIAGNOSIS — Z23 Encounter for immunization: Secondary | ICD-10-CM | POA: Diagnosis not present

## 2021-04-03 DIAGNOSIS — N2581 Secondary hyperparathyroidism of renal origin: Secondary | ICD-10-CM | POA: Diagnosis not present

## 2021-04-03 DIAGNOSIS — Z23 Encounter for immunization: Secondary | ICD-10-CM | POA: Diagnosis not present

## 2021-04-03 DIAGNOSIS — D631 Anemia in chronic kidney disease: Secondary | ICD-10-CM | POA: Diagnosis not present

## 2021-04-03 DIAGNOSIS — N186 End stage renal disease: Secondary | ICD-10-CM | POA: Diagnosis not present

## 2021-04-04 DIAGNOSIS — D631 Anemia in chronic kidney disease: Secondary | ICD-10-CM | POA: Diagnosis not present

## 2021-04-04 DIAGNOSIS — N2581 Secondary hyperparathyroidism of renal origin: Secondary | ICD-10-CM | POA: Diagnosis not present

## 2021-04-04 DIAGNOSIS — Z23 Encounter for immunization: Secondary | ICD-10-CM | POA: Diagnosis not present

## 2021-04-04 DIAGNOSIS — N186 End stage renal disease: Secondary | ICD-10-CM | POA: Diagnosis not present

## 2021-04-05 DIAGNOSIS — Z23 Encounter for immunization: Secondary | ICD-10-CM | POA: Diagnosis not present

## 2021-04-05 DIAGNOSIS — N186 End stage renal disease: Secondary | ICD-10-CM | POA: Diagnosis not present

## 2021-04-05 DIAGNOSIS — D631 Anemia in chronic kidney disease: Secondary | ICD-10-CM | POA: Diagnosis not present

## 2021-04-05 DIAGNOSIS — N2581 Secondary hyperparathyroidism of renal origin: Secondary | ICD-10-CM | POA: Diagnosis not present

## 2021-04-05 NOTE — Telephone Encounter (Signed)
Notified pt son form has been emailed/Mailed to pt address.Mary Valencia

## 2021-04-06 DIAGNOSIS — N186 End stage renal disease: Secondary | ICD-10-CM | POA: Diagnosis not present

## 2021-04-06 DIAGNOSIS — N2581 Secondary hyperparathyroidism of renal origin: Secondary | ICD-10-CM | POA: Diagnosis not present

## 2021-04-06 DIAGNOSIS — Z23 Encounter for immunization: Secondary | ICD-10-CM | POA: Diagnosis not present

## 2021-04-06 DIAGNOSIS — D631 Anemia in chronic kidney disease: Secondary | ICD-10-CM | POA: Diagnosis not present

## 2021-04-07 DIAGNOSIS — N186 End stage renal disease: Secondary | ICD-10-CM | POA: Diagnosis not present

## 2021-04-07 DIAGNOSIS — D631 Anemia in chronic kidney disease: Secondary | ICD-10-CM | POA: Diagnosis not present

## 2021-04-07 DIAGNOSIS — N2581 Secondary hyperparathyroidism of renal origin: Secondary | ICD-10-CM | POA: Diagnosis not present

## 2021-04-07 DIAGNOSIS — Z23 Encounter for immunization: Secondary | ICD-10-CM | POA: Diagnosis not present

## 2021-04-08 DIAGNOSIS — N2581 Secondary hyperparathyroidism of renal origin: Secondary | ICD-10-CM | POA: Diagnosis not present

## 2021-04-08 DIAGNOSIS — D631 Anemia in chronic kidney disease: Secondary | ICD-10-CM | POA: Diagnosis not present

## 2021-04-08 DIAGNOSIS — N186 End stage renal disease: Secondary | ICD-10-CM | POA: Diagnosis not present

## 2021-04-08 DIAGNOSIS — Z23 Encounter for immunization: Secondary | ICD-10-CM | POA: Diagnosis not present

## 2021-04-09 DIAGNOSIS — N2581 Secondary hyperparathyroidism of renal origin: Secondary | ICD-10-CM | POA: Diagnosis not present

## 2021-04-09 DIAGNOSIS — Z23 Encounter for immunization: Secondary | ICD-10-CM | POA: Diagnosis not present

## 2021-04-09 DIAGNOSIS — D631 Anemia in chronic kidney disease: Secondary | ICD-10-CM | POA: Diagnosis not present

## 2021-04-09 DIAGNOSIS — N186 End stage renal disease: Secondary | ICD-10-CM | POA: Diagnosis not present

## 2021-04-10 DIAGNOSIS — D631 Anemia in chronic kidney disease: Secondary | ICD-10-CM | POA: Diagnosis not present

## 2021-04-10 DIAGNOSIS — Z23 Encounter for immunization: Secondary | ICD-10-CM | POA: Diagnosis not present

## 2021-04-10 DIAGNOSIS — N186 End stage renal disease: Secondary | ICD-10-CM | POA: Diagnosis not present

## 2021-04-10 DIAGNOSIS — N2581 Secondary hyperparathyroidism of renal origin: Secondary | ICD-10-CM | POA: Diagnosis not present

## 2021-04-11 DIAGNOSIS — D631 Anemia in chronic kidney disease: Secondary | ICD-10-CM | POA: Diagnosis not present

## 2021-04-11 DIAGNOSIS — N186 End stage renal disease: Secondary | ICD-10-CM | POA: Diagnosis not present

## 2021-04-11 DIAGNOSIS — N2581 Secondary hyperparathyroidism of renal origin: Secondary | ICD-10-CM | POA: Diagnosis not present

## 2021-04-11 DIAGNOSIS — Z23 Encounter for immunization: Secondary | ICD-10-CM | POA: Diagnosis not present

## 2021-04-12 DIAGNOSIS — N2581 Secondary hyperparathyroidism of renal origin: Secondary | ICD-10-CM | POA: Diagnosis not present

## 2021-04-12 DIAGNOSIS — Z23 Encounter for immunization: Secondary | ICD-10-CM | POA: Diagnosis not present

## 2021-04-12 DIAGNOSIS — D631 Anemia in chronic kidney disease: Secondary | ICD-10-CM | POA: Diagnosis not present

## 2021-04-12 DIAGNOSIS — N186 End stage renal disease: Secondary | ICD-10-CM | POA: Diagnosis not present

## 2021-04-13 DIAGNOSIS — Z23 Encounter for immunization: Secondary | ICD-10-CM | POA: Diagnosis not present

## 2021-04-13 DIAGNOSIS — D631 Anemia in chronic kidney disease: Secondary | ICD-10-CM | POA: Diagnosis not present

## 2021-04-13 DIAGNOSIS — N186 End stage renal disease: Secondary | ICD-10-CM | POA: Diagnosis not present

## 2021-04-13 DIAGNOSIS — N2581 Secondary hyperparathyroidism of renal origin: Secondary | ICD-10-CM | POA: Diagnosis not present

## 2021-04-14 DIAGNOSIS — D631 Anemia in chronic kidney disease: Secondary | ICD-10-CM | POA: Diagnosis not present

## 2021-04-14 DIAGNOSIS — N2581 Secondary hyperparathyroidism of renal origin: Secondary | ICD-10-CM | POA: Diagnosis not present

## 2021-04-14 DIAGNOSIS — N186 End stage renal disease: Secondary | ICD-10-CM | POA: Diagnosis not present

## 2021-04-14 DIAGNOSIS — Z23 Encounter for immunization: Secondary | ICD-10-CM | POA: Diagnosis not present

## 2021-04-15 DIAGNOSIS — N186 End stage renal disease: Secondary | ICD-10-CM | POA: Diagnosis not present

## 2021-04-15 DIAGNOSIS — D631 Anemia in chronic kidney disease: Secondary | ICD-10-CM | POA: Diagnosis not present

## 2021-04-15 DIAGNOSIS — Z23 Encounter for immunization: Secondary | ICD-10-CM | POA: Diagnosis not present

## 2021-04-15 DIAGNOSIS — N2581 Secondary hyperparathyroidism of renal origin: Secondary | ICD-10-CM | POA: Diagnosis not present

## 2021-04-16 DIAGNOSIS — N186 End stage renal disease: Secondary | ICD-10-CM | POA: Diagnosis not present

## 2021-04-16 DIAGNOSIS — N2581 Secondary hyperparathyroidism of renal origin: Secondary | ICD-10-CM | POA: Diagnosis not present

## 2021-04-16 DIAGNOSIS — Z23 Encounter for immunization: Secondary | ICD-10-CM | POA: Diagnosis not present

## 2021-04-16 DIAGNOSIS — D631 Anemia in chronic kidney disease: Secondary | ICD-10-CM | POA: Diagnosis not present

## 2021-04-17 DIAGNOSIS — N2581 Secondary hyperparathyroidism of renal origin: Secondary | ICD-10-CM | POA: Diagnosis not present

## 2021-04-17 DIAGNOSIS — D631 Anemia in chronic kidney disease: Secondary | ICD-10-CM | POA: Diagnosis not present

## 2021-04-17 DIAGNOSIS — N186 End stage renal disease: Secondary | ICD-10-CM | POA: Diagnosis not present

## 2021-04-17 DIAGNOSIS — Z23 Encounter for immunization: Secondary | ICD-10-CM | POA: Diagnosis not present

## 2021-04-18 DIAGNOSIS — Z23 Encounter for immunization: Secondary | ICD-10-CM | POA: Diagnosis not present

## 2021-04-18 DIAGNOSIS — D631 Anemia in chronic kidney disease: Secondary | ICD-10-CM | POA: Diagnosis not present

## 2021-04-18 DIAGNOSIS — N186 End stage renal disease: Secondary | ICD-10-CM | POA: Diagnosis not present

## 2021-04-18 DIAGNOSIS — N2581 Secondary hyperparathyroidism of renal origin: Secondary | ICD-10-CM | POA: Diagnosis not present

## 2021-04-19 DIAGNOSIS — D631 Anemia in chronic kidney disease: Secondary | ICD-10-CM | POA: Diagnosis not present

## 2021-04-19 DIAGNOSIS — Z23 Encounter for immunization: Secondary | ICD-10-CM | POA: Diagnosis not present

## 2021-04-19 DIAGNOSIS — N186 End stage renal disease: Secondary | ICD-10-CM | POA: Diagnosis not present

## 2021-04-19 DIAGNOSIS — N2581 Secondary hyperparathyroidism of renal origin: Secondary | ICD-10-CM | POA: Diagnosis not present

## 2021-04-20 DIAGNOSIS — Z23 Encounter for immunization: Secondary | ICD-10-CM | POA: Diagnosis not present

## 2021-04-20 DIAGNOSIS — N2581 Secondary hyperparathyroidism of renal origin: Secondary | ICD-10-CM | POA: Diagnosis not present

## 2021-04-20 DIAGNOSIS — D631 Anemia in chronic kidney disease: Secondary | ICD-10-CM | POA: Diagnosis not present

## 2021-04-20 DIAGNOSIS — N186 End stage renal disease: Secondary | ICD-10-CM | POA: Diagnosis not present

## 2021-04-21 DIAGNOSIS — Z23 Encounter for immunization: Secondary | ICD-10-CM | POA: Diagnosis not present

## 2021-04-21 DIAGNOSIS — N186 End stage renal disease: Secondary | ICD-10-CM | POA: Diagnosis not present

## 2021-04-21 DIAGNOSIS — N2581 Secondary hyperparathyroidism of renal origin: Secondary | ICD-10-CM | POA: Diagnosis not present

## 2021-04-21 DIAGNOSIS — D631 Anemia in chronic kidney disease: Secondary | ICD-10-CM | POA: Diagnosis not present

## 2021-04-22 DIAGNOSIS — D631 Anemia in chronic kidney disease: Secondary | ICD-10-CM | POA: Diagnosis not present

## 2021-04-22 DIAGNOSIS — Z23 Encounter for immunization: Secondary | ICD-10-CM | POA: Diagnosis not present

## 2021-04-22 DIAGNOSIS — N186 End stage renal disease: Secondary | ICD-10-CM | POA: Diagnosis not present

## 2021-04-22 DIAGNOSIS — N2581 Secondary hyperparathyroidism of renal origin: Secondary | ICD-10-CM | POA: Diagnosis not present

## 2021-04-23 DIAGNOSIS — D631 Anemia in chronic kidney disease: Secondary | ICD-10-CM | POA: Diagnosis not present

## 2021-04-23 DIAGNOSIS — N2581 Secondary hyperparathyroidism of renal origin: Secondary | ICD-10-CM | POA: Diagnosis not present

## 2021-04-23 DIAGNOSIS — N186 End stage renal disease: Secondary | ICD-10-CM | POA: Diagnosis not present

## 2021-04-23 DIAGNOSIS — Z23 Encounter for immunization: Secondary | ICD-10-CM | POA: Diagnosis not present

## 2021-04-24 DIAGNOSIS — N2581 Secondary hyperparathyroidism of renal origin: Secondary | ICD-10-CM | POA: Diagnosis not present

## 2021-04-24 DIAGNOSIS — D631 Anemia in chronic kidney disease: Secondary | ICD-10-CM | POA: Diagnosis not present

## 2021-04-24 DIAGNOSIS — Z23 Encounter for immunization: Secondary | ICD-10-CM | POA: Diagnosis not present

## 2021-04-24 DIAGNOSIS — N186 End stage renal disease: Secondary | ICD-10-CM | POA: Diagnosis not present

## 2021-04-25 DIAGNOSIS — N2581 Secondary hyperparathyroidism of renal origin: Secondary | ICD-10-CM | POA: Diagnosis not present

## 2021-04-25 DIAGNOSIS — D631 Anemia in chronic kidney disease: Secondary | ICD-10-CM | POA: Diagnosis not present

## 2021-04-25 DIAGNOSIS — N186 End stage renal disease: Secondary | ICD-10-CM | POA: Diagnosis not present

## 2021-04-25 DIAGNOSIS — Z992 Dependence on renal dialysis: Secondary | ICD-10-CM | POA: Diagnosis not present

## 2021-04-25 DIAGNOSIS — Z23 Encounter for immunization: Secondary | ICD-10-CM | POA: Diagnosis not present

## 2021-04-26 DIAGNOSIS — D631 Anemia in chronic kidney disease: Secondary | ICD-10-CM | POA: Diagnosis not present

## 2021-04-26 DIAGNOSIS — N186 End stage renal disease: Secondary | ICD-10-CM | POA: Diagnosis not present

## 2021-04-26 DIAGNOSIS — N2581 Secondary hyperparathyroidism of renal origin: Secondary | ICD-10-CM | POA: Diagnosis not present

## 2021-04-27 DIAGNOSIS — D631 Anemia in chronic kidney disease: Secondary | ICD-10-CM | POA: Diagnosis not present

## 2021-04-27 DIAGNOSIS — N186 End stage renal disease: Secondary | ICD-10-CM | POA: Diagnosis not present

## 2021-04-27 DIAGNOSIS — N2581 Secondary hyperparathyroidism of renal origin: Secondary | ICD-10-CM | POA: Diagnosis not present

## 2021-04-28 ENCOUNTER — Other Ambulatory Visit: Payer: Self-pay | Admitting: Internal Medicine

## 2021-04-28 DIAGNOSIS — N186 End stage renal disease: Secondary | ICD-10-CM | POA: Diagnosis not present

## 2021-04-28 DIAGNOSIS — E119 Type 2 diabetes mellitus without complications: Secondary | ICD-10-CM | POA: Diagnosis not present

## 2021-04-28 DIAGNOSIS — D631 Anemia in chronic kidney disease: Secondary | ICD-10-CM | POA: Diagnosis not present

## 2021-04-28 DIAGNOSIS — N2581 Secondary hyperparathyroidism of renal origin: Secondary | ICD-10-CM | POA: Diagnosis not present

## 2021-04-29 DIAGNOSIS — N2581 Secondary hyperparathyroidism of renal origin: Secondary | ICD-10-CM | POA: Diagnosis not present

## 2021-04-29 DIAGNOSIS — D631 Anemia in chronic kidney disease: Secondary | ICD-10-CM | POA: Diagnosis not present

## 2021-04-29 DIAGNOSIS — N186 End stage renal disease: Secondary | ICD-10-CM | POA: Diagnosis not present

## 2021-04-30 DIAGNOSIS — N186 End stage renal disease: Secondary | ICD-10-CM | POA: Diagnosis not present

## 2021-04-30 DIAGNOSIS — N2581 Secondary hyperparathyroidism of renal origin: Secondary | ICD-10-CM | POA: Diagnosis not present

## 2021-04-30 DIAGNOSIS — D631 Anemia in chronic kidney disease: Secondary | ICD-10-CM | POA: Diagnosis not present

## 2021-05-01 DIAGNOSIS — N186 End stage renal disease: Secondary | ICD-10-CM | POA: Diagnosis not present

## 2021-05-01 DIAGNOSIS — N2581 Secondary hyperparathyroidism of renal origin: Secondary | ICD-10-CM | POA: Diagnosis not present

## 2021-05-01 DIAGNOSIS — D631 Anemia in chronic kidney disease: Secondary | ICD-10-CM | POA: Diagnosis not present

## 2021-05-02 DIAGNOSIS — N186 End stage renal disease: Secondary | ICD-10-CM | POA: Diagnosis not present

## 2021-05-02 DIAGNOSIS — N2581 Secondary hyperparathyroidism of renal origin: Secondary | ICD-10-CM | POA: Diagnosis not present

## 2021-05-02 DIAGNOSIS — D631 Anemia in chronic kidney disease: Secondary | ICD-10-CM | POA: Diagnosis not present

## 2021-05-03 DIAGNOSIS — D631 Anemia in chronic kidney disease: Secondary | ICD-10-CM | POA: Diagnosis not present

## 2021-05-03 DIAGNOSIS — N2581 Secondary hyperparathyroidism of renal origin: Secondary | ICD-10-CM | POA: Diagnosis not present

## 2021-05-03 DIAGNOSIS — N186 End stage renal disease: Secondary | ICD-10-CM | POA: Diagnosis not present

## 2021-05-04 DIAGNOSIS — D631 Anemia in chronic kidney disease: Secondary | ICD-10-CM | POA: Diagnosis not present

## 2021-05-04 DIAGNOSIS — N186 End stage renal disease: Secondary | ICD-10-CM | POA: Diagnosis not present

## 2021-05-04 DIAGNOSIS — N2581 Secondary hyperparathyroidism of renal origin: Secondary | ICD-10-CM | POA: Diagnosis not present

## 2021-05-05 DIAGNOSIS — N186 End stage renal disease: Secondary | ICD-10-CM | POA: Diagnosis not present

## 2021-05-05 DIAGNOSIS — N2581 Secondary hyperparathyroidism of renal origin: Secondary | ICD-10-CM | POA: Diagnosis not present

## 2021-05-05 DIAGNOSIS — D631 Anemia in chronic kidney disease: Secondary | ICD-10-CM | POA: Diagnosis not present

## 2021-05-06 DIAGNOSIS — N186 End stage renal disease: Secondary | ICD-10-CM | POA: Diagnosis not present

## 2021-05-06 DIAGNOSIS — D631 Anemia in chronic kidney disease: Secondary | ICD-10-CM | POA: Diagnosis not present

## 2021-05-06 DIAGNOSIS — N2581 Secondary hyperparathyroidism of renal origin: Secondary | ICD-10-CM | POA: Diagnosis not present

## 2021-05-07 DIAGNOSIS — N186 End stage renal disease: Secondary | ICD-10-CM | POA: Diagnosis not present

## 2021-05-07 DIAGNOSIS — N2581 Secondary hyperparathyroidism of renal origin: Secondary | ICD-10-CM | POA: Diagnosis not present

## 2021-05-07 DIAGNOSIS — D631 Anemia in chronic kidney disease: Secondary | ICD-10-CM | POA: Diagnosis not present

## 2021-05-08 DIAGNOSIS — N2581 Secondary hyperparathyroidism of renal origin: Secondary | ICD-10-CM | POA: Diagnosis not present

## 2021-05-08 DIAGNOSIS — N186 End stage renal disease: Secondary | ICD-10-CM | POA: Diagnosis not present

## 2021-05-08 DIAGNOSIS — D631 Anemia in chronic kidney disease: Secondary | ICD-10-CM | POA: Diagnosis not present

## 2021-05-09 DIAGNOSIS — D631 Anemia in chronic kidney disease: Secondary | ICD-10-CM | POA: Diagnosis not present

## 2021-05-09 DIAGNOSIS — N2581 Secondary hyperparathyroidism of renal origin: Secondary | ICD-10-CM | POA: Diagnosis not present

## 2021-05-09 DIAGNOSIS — N186 End stage renal disease: Secondary | ICD-10-CM | POA: Diagnosis not present

## 2021-05-10 DIAGNOSIS — N186 End stage renal disease: Secondary | ICD-10-CM | POA: Diagnosis not present

## 2021-05-10 DIAGNOSIS — N2581 Secondary hyperparathyroidism of renal origin: Secondary | ICD-10-CM | POA: Diagnosis not present

## 2021-05-10 DIAGNOSIS — D631 Anemia in chronic kidney disease: Secondary | ICD-10-CM | POA: Diagnosis not present

## 2021-05-11 DIAGNOSIS — D631 Anemia in chronic kidney disease: Secondary | ICD-10-CM | POA: Diagnosis not present

## 2021-05-11 DIAGNOSIS — N2581 Secondary hyperparathyroidism of renal origin: Secondary | ICD-10-CM | POA: Diagnosis not present

## 2021-05-11 DIAGNOSIS — N186 End stage renal disease: Secondary | ICD-10-CM | POA: Diagnosis not present

## 2021-05-12 DIAGNOSIS — N186 End stage renal disease: Secondary | ICD-10-CM | POA: Diagnosis not present

## 2021-05-12 DIAGNOSIS — N2581 Secondary hyperparathyroidism of renal origin: Secondary | ICD-10-CM | POA: Diagnosis not present

## 2021-05-12 DIAGNOSIS — D631 Anemia in chronic kidney disease: Secondary | ICD-10-CM | POA: Diagnosis not present

## 2021-05-13 DIAGNOSIS — D631 Anemia in chronic kidney disease: Secondary | ICD-10-CM | POA: Diagnosis not present

## 2021-05-13 DIAGNOSIS — N2581 Secondary hyperparathyroidism of renal origin: Secondary | ICD-10-CM | POA: Diagnosis not present

## 2021-05-13 DIAGNOSIS — N186 End stage renal disease: Secondary | ICD-10-CM | POA: Diagnosis not present

## 2021-05-14 DIAGNOSIS — N2581 Secondary hyperparathyroidism of renal origin: Secondary | ICD-10-CM | POA: Diagnosis not present

## 2021-05-14 DIAGNOSIS — D631 Anemia in chronic kidney disease: Secondary | ICD-10-CM | POA: Diagnosis not present

## 2021-05-14 DIAGNOSIS — N186 End stage renal disease: Secondary | ICD-10-CM | POA: Diagnosis not present

## 2021-05-15 DIAGNOSIS — N186 End stage renal disease: Secondary | ICD-10-CM | POA: Diagnosis not present

## 2021-05-15 DIAGNOSIS — D631 Anemia in chronic kidney disease: Secondary | ICD-10-CM | POA: Diagnosis not present

## 2021-05-15 DIAGNOSIS — N2581 Secondary hyperparathyroidism of renal origin: Secondary | ICD-10-CM | POA: Diagnosis not present

## 2021-05-16 DIAGNOSIS — D631 Anemia in chronic kidney disease: Secondary | ICD-10-CM | POA: Diagnosis not present

## 2021-05-16 DIAGNOSIS — N186 End stage renal disease: Secondary | ICD-10-CM | POA: Diagnosis not present

## 2021-05-16 DIAGNOSIS — N2581 Secondary hyperparathyroidism of renal origin: Secondary | ICD-10-CM | POA: Diagnosis not present

## 2021-05-17 DIAGNOSIS — N2581 Secondary hyperparathyroidism of renal origin: Secondary | ICD-10-CM | POA: Diagnosis not present

## 2021-05-17 DIAGNOSIS — D631 Anemia in chronic kidney disease: Secondary | ICD-10-CM | POA: Diagnosis not present

## 2021-05-17 DIAGNOSIS — N186 End stage renal disease: Secondary | ICD-10-CM | POA: Diagnosis not present

## 2021-05-18 DIAGNOSIS — N2581 Secondary hyperparathyroidism of renal origin: Secondary | ICD-10-CM | POA: Diagnosis not present

## 2021-05-18 DIAGNOSIS — N186 End stage renal disease: Secondary | ICD-10-CM | POA: Diagnosis not present

## 2021-05-18 DIAGNOSIS — D631 Anemia in chronic kidney disease: Secondary | ICD-10-CM | POA: Diagnosis not present

## 2021-05-19 DIAGNOSIS — D631 Anemia in chronic kidney disease: Secondary | ICD-10-CM | POA: Diagnosis not present

## 2021-05-19 DIAGNOSIS — N2581 Secondary hyperparathyroidism of renal origin: Secondary | ICD-10-CM | POA: Diagnosis not present

## 2021-05-19 DIAGNOSIS — N186 End stage renal disease: Secondary | ICD-10-CM | POA: Diagnosis not present

## 2021-05-20 DIAGNOSIS — N2581 Secondary hyperparathyroidism of renal origin: Secondary | ICD-10-CM | POA: Diagnosis not present

## 2021-05-20 DIAGNOSIS — N186 End stage renal disease: Secondary | ICD-10-CM | POA: Diagnosis not present

## 2021-05-20 DIAGNOSIS — D631 Anemia in chronic kidney disease: Secondary | ICD-10-CM | POA: Diagnosis not present

## 2021-05-21 DIAGNOSIS — D631 Anemia in chronic kidney disease: Secondary | ICD-10-CM | POA: Diagnosis not present

## 2021-05-21 DIAGNOSIS — N2581 Secondary hyperparathyroidism of renal origin: Secondary | ICD-10-CM | POA: Diagnosis not present

## 2021-05-21 DIAGNOSIS — N186 End stage renal disease: Secondary | ICD-10-CM | POA: Diagnosis not present

## 2021-05-22 DIAGNOSIS — N2581 Secondary hyperparathyroidism of renal origin: Secondary | ICD-10-CM | POA: Diagnosis not present

## 2021-05-22 DIAGNOSIS — D631 Anemia in chronic kidney disease: Secondary | ICD-10-CM | POA: Diagnosis not present

## 2021-05-22 DIAGNOSIS — N186 End stage renal disease: Secondary | ICD-10-CM | POA: Diagnosis not present

## 2021-05-23 DIAGNOSIS — N2581 Secondary hyperparathyroidism of renal origin: Secondary | ICD-10-CM | POA: Diagnosis not present

## 2021-05-23 DIAGNOSIS — N186 End stage renal disease: Secondary | ICD-10-CM | POA: Diagnosis not present

## 2021-05-23 DIAGNOSIS — D631 Anemia in chronic kidney disease: Secondary | ICD-10-CM | POA: Diagnosis not present

## 2021-05-24 DIAGNOSIS — N2581 Secondary hyperparathyroidism of renal origin: Secondary | ICD-10-CM | POA: Diagnosis not present

## 2021-05-24 DIAGNOSIS — D631 Anemia in chronic kidney disease: Secondary | ICD-10-CM | POA: Diagnosis not present

## 2021-05-24 DIAGNOSIS — N186 End stage renal disease: Secondary | ICD-10-CM | POA: Diagnosis not present

## 2021-05-25 DIAGNOSIS — N2581 Secondary hyperparathyroidism of renal origin: Secondary | ICD-10-CM | POA: Diagnosis not present

## 2021-05-25 DIAGNOSIS — N186 End stage renal disease: Secondary | ICD-10-CM | POA: Diagnosis not present

## 2021-05-25 DIAGNOSIS — D631 Anemia in chronic kidney disease: Secondary | ICD-10-CM | POA: Diagnosis not present

## 2021-05-25 DIAGNOSIS — Z992 Dependence on renal dialysis: Secondary | ICD-10-CM | POA: Diagnosis not present

## 2021-05-26 DIAGNOSIS — N186 End stage renal disease: Secondary | ICD-10-CM | POA: Diagnosis not present

## 2021-05-26 DIAGNOSIS — D631 Anemia in chronic kidney disease: Secondary | ICD-10-CM | POA: Diagnosis not present

## 2021-05-26 DIAGNOSIS — N2581 Secondary hyperparathyroidism of renal origin: Secondary | ICD-10-CM | POA: Diagnosis not present

## 2021-05-27 DIAGNOSIS — N2581 Secondary hyperparathyroidism of renal origin: Secondary | ICD-10-CM | POA: Diagnosis not present

## 2021-05-27 DIAGNOSIS — N186 End stage renal disease: Secondary | ICD-10-CM | POA: Diagnosis not present

## 2021-05-27 DIAGNOSIS — D631 Anemia in chronic kidney disease: Secondary | ICD-10-CM | POA: Diagnosis not present

## 2021-05-28 DIAGNOSIS — N186 End stage renal disease: Secondary | ICD-10-CM | POA: Diagnosis not present

## 2021-05-28 DIAGNOSIS — N2581 Secondary hyperparathyroidism of renal origin: Secondary | ICD-10-CM | POA: Diagnosis not present

## 2021-05-28 DIAGNOSIS — D631 Anemia in chronic kidney disease: Secondary | ICD-10-CM | POA: Diagnosis not present

## 2021-05-29 DIAGNOSIS — D631 Anemia in chronic kidney disease: Secondary | ICD-10-CM | POA: Diagnosis not present

## 2021-05-29 DIAGNOSIS — N2581 Secondary hyperparathyroidism of renal origin: Secondary | ICD-10-CM | POA: Diagnosis not present

## 2021-05-29 DIAGNOSIS — N186 End stage renal disease: Secondary | ICD-10-CM | POA: Diagnosis not present

## 2021-05-30 DIAGNOSIS — D631 Anemia in chronic kidney disease: Secondary | ICD-10-CM | POA: Diagnosis not present

## 2021-05-30 DIAGNOSIS — N2581 Secondary hyperparathyroidism of renal origin: Secondary | ICD-10-CM | POA: Diagnosis not present

## 2021-05-30 DIAGNOSIS — E119 Type 2 diabetes mellitus without complications: Secondary | ICD-10-CM | POA: Diagnosis not present

## 2021-05-30 DIAGNOSIS — R6889 Other general symptoms and signs: Secondary | ICD-10-CM | POA: Diagnosis not present

## 2021-05-30 DIAGNOSIS — N186 End stage renal disease: Secondary | ICD-10-CM | POA: Diagnosis not present

## 2021-05-31 DIAGNOSIS — D631 Anemia in chronic kidney disease: Secondary | ICD-10-CM | POA: Diagnosis not present

## 2021-05-31 DIAGNOSIS — N2581 Secondary hyperparathyroidism of renal origin: Secondary | ICD-10-CM | POA: Diagnosis not present

## 2021-05-31 DIAGNOSIS — N186 End stage renal disease: Secondary | ICD-10-CM | POA: Diagnosis not present

## 2021-06-01 DIAGNOSIS — N2581 Secondary hyperparathyroidism of renal origin: Secondary | ICD-10-CM | POA: Diagnosis not present

## 2021-06-01 DIAGNOSIS — D631 Anemia in chronic kidney disease: Secondary | ICD-10-CM | POA: Diagnosis not present

## 2021-06-01 DIAGNOSIS — N186 End stage renal disease: Secondary | ICD-10-CM | POA: Diagnosis not present

## 2021-06-02 DIAGNOSIS — N186 End stage renal disease: Secondary | ICD-10-CM | POA: Diagnosis not present

## 2021-06-02 DIAGNOSIS — D631 Anemia in chronic kidney disease: Secondary | ICD-10-CM | POA: Diagnosis not present

## 2021-06-02 DIAGNOSIS — N2581 Secondary hyperparathyroidism of renal origin: Secondary | ICD-10-CM | POA: Diagnosis not present

## 2021-06-03 DIAGNOSIS — N186 End stage renal disease: Secondary | ICD-10-CM | POA: Diagnosis not present

## 2021-06-03 DIAGNOSIS — D631 Anemia in chronic kidney disease: Secondary | ICD-10-CM | POA: Diagnosis not present

## 2021-06-03 DIAGNOSIS — N2581 Secondary hyperparathyroidism of renal origin: Secondary | ICD-10-CM | POA: Diagnosis not present

## 2021-06-04 DIAGNOSIS — D631 Anemia in chronic kidney disease: Secondary | ICD-10-CM | POA: Diagnosis not present

## 2021-06-04 DIAGNOSIS — N186 End stage renal disease: Secondary | ICD-10-CM | POA: Diagnosis not present

## 2021-06-04 DIAGNOSIS — N2581 Secondary hyperparathyroidism of renal origin: Secondary | ICD-10-CM | POA: Diagnosis not present

## 2021-06-05 DIAGNOSIS — N2581 Secondary hyperparathyroidism of renal origin: Secondary | ICD-10-CM | POA: Diagnosis not present

## 2021-06-05 DIAGNOSIS — N186 End stage renal disease: Secondary | ICD-10-CM | POA: Diagnosis not present

## 2021-06-05 DIAGNOSIS — D631 Anemia in chronic kidney disease: Secondary | ICD-10-CM | POA: Diagnosis not present

## 2021-06-06 DIAGNOSIS — D631 Anemia in chronic kidney disease: Secondary | ICD-10-CM | POA: Diagnosis not present

## 2021-06-06 DIAGNOSIS — N186 End stage renal disease: Secondary | ICD-10-CM | POA: Diagnosis not present

## 2021-06-06 DIAGNOSIS — N2581 Secondary hyperparathyroidism of renal origin: Secondary | ICD-10-CM | POA: Diagnosis not present

## 2021-06-07 DIAGNOSIS — D631 Anemia in chronic kidney disease: Secondary | ICD-10-CM | POA: Diagnosis not present

## 2021-06-07 DIAGNOSIS — N2581 Secondary hyperparathyroidism of renal origin: Secondary | ICD-10-CM | POA: Diagnosis not present

## 2021-06-07 DIAGNOSIS — N186 End stage renal disease: Secondary | ICD-10-CM | POA: Diagnosis not present

## 2021-06-08 DIAGNOSIS — D631 Anemia in chronic kidney disease: Secondary | ICD-10-CM | POA: Diagnosis not present

## 2021-06-08 DIAGNOSIS — N2581 Secondary hyperparathyroidism of renal origin: Secondary | ICD-10-CM | POA: Diagnosis not present

## 2021-06-08 DIAGNOSIS — N186 End stage renal disease: Secondary | ICD-10-CM | POA: Diagnosis not present

## 2021-06-09 DIAGNOSIS — E118 Type 2 diabetes mellitus with unspecified complications: Secondary | ICD-10-CM | POA: Diagnosis not present

## 2021-06-09 DIAGNOSIS — N189 Chronic kidney disease, unspecified: Secondary | ICD-10-CM | POA: Diagnosis not present

## 2021-06-09 DIAGNOSIS — N3946 Mixed incontinence: Secondary | ICD-10-CM | POA: Diagnosis not present

## 2021-06-09 DIAGNOSIS — D631 Anemia in chronic kidney disease: Secondary | ICD-10-CM | POA: Diagnosis not present

## 2021-06-09 DIAGNOSIS — N186 End stage renal disease: Secondary | ICD-10-CM | POA: Diagnosis not present

## 2021-06-09 DIAGNOSIS — N2581 Secondary hyperparathyroidism of renal origin: Secondary | ICD-10-CM | POA: Diagnosis not present

## 2021-06-10 DIAGNOSIS — R6889 Other general symptoms and signs: Secondary | ICD-10-CM | POA: Diagnosis not present

## 2021-06-10 DIAGNOSIS — D631 Anemia in chronic kidney disease: Secondary | ICD-10-CM | POA: Diagnosis not present

## 2021-06-10 DIAGNOSIS — N186 End stage renal disease: Secondary | ICD-10-CM | POA: Diagnosis not present

## 2021-06-10 DIAGNOSIS — N2581 Secondary hyperparathyroidism of renal origin: Secondary | ICD-10-CM | POA: Diagnosis not present

## 2021-06-11 DIAGNOSIS — N186 End stage renal disease: Secondary | ICD-10-CM | POA: Diagnosis not present

## 2021-06-11 DIAGNOSIS — N2581 Secondary hyperparathyroidism of renal origin: Secondary | ICD-10-CM | POA: Diagnosis not present

## 2021-06-11 DIAGNOSIS — D631 Anemia in chronic kidney disease: Secondary | ICD-10-CM | POA: Diagnosis not present

## 2021-06-12 DIAGNOSIS — N2581 Secondary hyperparathyroidism of renal origin: Secondary | ICD-10-CM | POA: Diagnosis not present

## 2021-06-12 DIAGNOSIS — D631 Anemia in chronic kidney disease: Secondary | ICD-10-CM | POA: Diagnosis not present

## 2021-06-12 DIAGNOSIS — N186 End stage renal disease: Secondary | ICD-10-CM | POA: Diagnosis not present

## 2021-06-13 DIAGNOSIS — N186 End stage renal disease: Secondary | ICD-10-CM | POA: Diagnosis not present

## 2021-06-13 DIAGNOSIS — D631 Anemia in chronic kidney disease: Secondary | ICD-10-CM | POA: Diagnosis not present

## 2021-06-13 DIAGNOSIS — N2581 Secondary hyperparathyroidism of renal origin: Secondary | ICD-10-CM | POA: Diagnosis not present

## 2021-06-14 DIAGNOSIS — N2581 Secondary hyperparathyroidism of renal origin: Secondary | ICD-10-CM | POA: Diagnosis not present

## 2021-06-14 DIAGNOSIS — D631 Anemia in chronic kidney disease: Secondary | ICD-10-CM | POA: Diagnosis not present

## 2021-06-14 DIAGNOSIS — N186 End stage renal disease: Secondary | ICD-10-CM | POA: Diagnosis not present

## 2021-06-15 DIAGNOSIS — N186 End stage renal disease: Secondary | ICD-10-CM | POA: Diagnosis not present

## 2021-06-15 DIAGNOSIS — N2581 Secondary hyperparathyroidism of renal origin: Secondary | ICD-10-CM | POA: Diagnosis not present

## 2021-06-15 DIAGNOSIS — D631 Anemia in chronic kidney disease: Secondary | ICD-10-CM | POA: Diagnosis not present

## 2021-06-16 ENCOUNTER — Encounter: Payer: Self-pay | Admitting: Internal Medicine

## 2021-06-16 ENCOUNTER — Other Ambulatory Visit: Payer: Self-pay

## 2021-06-16 ENCOUNTER — Ambulatory Visit (INDEPENDENT_AMBULATORY_CARE_PROVIDER_SITE_OTHER): Payer: Medicare HMO | Admitting: Internal Medicine

## 2021-06-16 DIAGNOSIS — I2510 Atherosclerotic heart disease of native coronary artery without angina pectoris: Secondary | ICD-10-CM | POA: Diagnosis not present

## 2021-06-16 DIAGNOSIS — D631 Anemia in chronic kidney disease: Secondary | ICD-10-CM | POA: Diagnosis not present

## 2021-06-16 DIAGNOSIS — E118 Type 2 diabetes mellitus with unspecified complications: Secondary | ICD-10-CM | POA: Diagnosis not present

## 2021-06-16 DIAGNOSIS — Z992 Dependence on renal dialysis: Secondary | ICD-10-CM | POA: Diagnosis not present

## 2021-06-16 DIAGNOSIS — Z8673 Personal history of transient ischemic attack (TIA), and cerebral infarction without residual deficits: Secondary | ICD-10-CM

## 2021-06-16 DIAGNOSIS — N186 End stage renal disease: Secondary | ICD-10-CM

## 2021-06-16 DIAGNOSIS — N2581 Secondary hyperparathyroidism of renal origin: Secondary | ICD-10-CM | POA: Diagnosis not present

## 2021-06-16 LAB — POCT GLYCOSYLATED HEMOGLOBIN (HGB A1C)
HbA1c POC (<> result, manual entry): 12.6 % (ref 4.0–5.6)
HbA1c, POC (controlled diabetic range): 12.6 % — AB (ref 0.0–7.0)
HbA1c, POC (prediabetic range): 12.6 % — AB (ref 5.7–6.4)
Hemoglobin A1C: 12.6 % — AB (ref 4.0–5.6)

## 2021-06-16 NOTE — Progress Notes (Signed)
CC: Diabetes (F/U ON DM)     HPI Mary Valencia presents for DM - on Libre 2 F/u HTN, CRF         Outpatient Medications Prior to Visit  Medication Sig Dispense Refill   Alcohol Swabs (B-D SINGLE USE SWABS REGULAR) PADS Use as directed to clean site to check blood sugars 180 each 3   atorvastatin (LIPITOR) 40 MG tablet TAKE 1 TABLET EVERY DAY (FOLLOW UP APPOINTMENT DUE IN FEBRUARY WITH LIPID CHECK MUST SEE PROVIDER FOR FUTURE REFILLS) 90 tablet 0   calcitRIOL (ROCALTROL) 0.5 MCG capsule Take by mouth. TAKE 1 BY MOUTH DAILY       Continuous Blood Gluc Receiver (FREESTYLE LIBRE 14 DAY READER) DEVI 1 Units by Does not apply route daily. 1 each 1   Continuous Blood Gluc Sensor (FREESTYLE LIBRE 14 DAY SENSOR) MISC 1 Units by Does not apply route every 14 (fourteen) days. 6 each 3   diphenhydrAMINE (BENADRYL) 25 mg capsule Take 25 mg by mouth as needed.       glucose blood test strip TEST BLOOD GLUCOSE THREE TIMES DAILY 300 each 3   hydrALAZINE (APRESOLINE) 100 MG tablet Take 1 tablet (100 mg total) by mouth 3 (three) times daily. 270 tablet 3   hydrALAZINE (APRESOLINE) 50 MG tablet TAKE 1 BY  MOUTH TWICE A DAY       insulin NPH Human (NOVOLIN N) 100 UNIT/ML injection Inject into the skin. Inject 8 Units into the skin 2 times daily with meals       labetalol (NORMODYNE) 200 MG tablet Take 1 tablet (200 mg total) by mouth 3 (three) times daily. 90 tablet 3   losartan (COZAAR) 50 MG tablet Take 1 tablet (50 mg total) by mouth daily. (Patient taking differently: Take 50 mg by mouth in the morning and at bedtime.) 90 tablet 3   NIFEdipine (ADALAT CC) 90 MG 24 hr tablet TAKE 1 BY MOUTH DAILY       omeprazole (PRILOSEC) 40 MG capsule TAKE 1 CAPSULE EVERY DAY 90 capsule 3   polyethylene glycol (MIRALAX / GLYCOLAX) packet Take 17 g by mouth.       PRESCRIPTION MEDICATION 210 mg 3 (three) times daily before meals.        senna-docusate (SENOKOT-S) 8.6-50 MG tablet Take by mouth 2 (two) times daily. Take  2 tab twice daily.       VELPHORO 500 MG chewable tablet CHEW 1 THREE TIMES A DAY WITH MEALS       Blood Glucose Monitoring Suppl (TRUE METRIX METER) w/Device KIT USE AS DIRECTED TO CHECK BLOOD SUGAR (Patient not taking: Reported on 06/16/2021) 1 kit 0   calcium acetate, Phos Binder, (PHOSLYRA) 667 MG/5ML SOLN Take 667 mg by mouth 3 (three) times daily with meals. (Patient not taking: Reported on 06/16/2021)       calcium carbonate (TUMS - DOSED IN MG ELEMENTAL CALCIUM) 500 MG chewable tablet  (Patient not taking: Reported on 06/16/2021)       CALCIUM-MAGNESIUM-VITAMIN D PO Take 1 tablet by mouth daily. (Patient not taking: Reported on 06/16/2021)       glucose blood (TRUE METRIX BLOOD GLUCOSE TEST) test strip Use to check blood sugars twice a day (Patient not taking: Reported on 06/16/2021) 180 each 3   Insulin NPH Human, Isophane, (NOVOLIN N [place]) Inject 2 Doses into the skin. 5 mg in morning, 7 mg at night (Patient not taking: Reported on 06/16/2021)       mupirocin  ointment (BACTROBAN) 2 % On leg wound w/dressing change qd or bid (Patient not taking: Reported on 06/16/2021) 30 g 1   NIFEdipine (ADALAT CC) 60 MG 24 hr tablet  (Patient not taking: Reported on 06/16/2021)       NOVOLOG 100 UNIT/ML injection Inject 7 Units into the skin in the morning and at bedtime. (Patient not taking: Reported on 06/16/2021) 10 mL 11   talc powder Apply topically as needed. Under breasts (Patient not taking: Reported on 06/16/2021) 240 g 0   torsemide (DEMADEX) 20 MG tablet Take 40 mg by mouth.  (Patient not taking: Reported on 06/16/2021)       TRUEplus Lancets 33G MISC Use to help check blood sugar twice a day (Patient not taking: Reported on 06/16/2021) 180 each 3    No facility-administered medications prior to visit.      ROS: Review of Systems  Constitutional:  Positive for fatigue. Negative for activity change, appetite change, chills and unexpected weight change.  HENT:  Negative for congestion, mouth  sores and sinus pressure.   Eyes:  Negative for visual disturbance.  Respiratory:  Negative for cough and chest tightness.   Gastrointestinal:  Negative for abdominal pain and nausea.  Genitourinary:  Negative for difficulty urinating, frequency and vaginal pain.  Musculoskeletal:  Positive for arthralgias and gait problem. Negative for back pain.  Skin:  Negative for pallor and rash.  Neurological:  Positive for weakness. Negative for dizziness, tremors, numbness and headaches.  Psychiatric/Behavioral:  Negative for confusion and sleep disturbance.     Objective:  BP 132/84 (BP Location: Left Arm)    Pulse 77    Temp 97.8 F (36.6 C) (Oral)    Ht 5' 2" (1.575 m)    Wt 138 lb 9.6 oz (62.9 kg)    SpO2 94%    BMI 25.35 kg/m       BP Readings from Last 3 Encounters:  06/16/21 132/84  10/11/20 130/62  08/23/20 98/62         Wt Readings from Last 3 Encounters:  06/16/21 138 lb 9.6 oz (62.9 kg)  10/11/20 120 lb 3.2 oz (54.5 kg)  08/23/20 126 lb 3.2 oz (57.2 kg)      Physical Exam Constitutional:      General: She is not in acute distress.    Appearance: She is well-developed.  HENT:     Head: Normocephalic.     Right Ear: External ear normal.     Left Ear: External ear normal.     Nose: Nose normal.  Eyes:     General:        Right eye: No discharge.        Left eye: No discharge.     Conjunctiva/sclera: Conjunctivae normal.     Pupils: Pupils are equal, round, and reactive to light.  Neck:     Thyroid: No thyromegaly.     Vascular: No JVD.     Trachea: No tracheal deviation.  Cardiovascular:     Rate and Rhythm: Normal rate and regular rhythm.     Heart sounds: Normal heart sounds.  Pulmonary:     Effort: No respiratory distress.     Breath sounds: No stridor. No wheezing.  Abdominal:     General: Bowel sounds are normal. There is no distension.     Palpations: Abdomen is soft. There is no mass.     Tenderness: There is no abdominal tenderness. There is no guarding  or rebound.  Musculoskeletal:  General: No tenderness.     Cervical back: Normal range of motion and neck supple. No rigidity.  Lymphadenopathy:     Cervical: No cervical adenopathy.  Skin:    Findings: No erythema or rash.  Neurological:     Mental Status: She is oriented to person, place, and time.     Cranial Nerves: No cranial nerve deficit.     Motor: Weakness present. No abnormal muscle tone.     Coordination: Coordination abnormal.     Gait: Gait abnormal.     Deep Tendon Reflexes: Reflexes normal.  Psychiatric:        Behavior: Behavior normal.        Thought Content: Thought content normal.        Judgment: Judgment normal.   in a w/c   Recent Labs       Lab Results  Component Value Date    WBC 7.7 08/29/2017    HGB 29.1 09/01/2017    HCT 30 (A) 08/29/2017    PLT 203 08/29/2017    GLUCOSE 279 (H) 09/21/2017    CHOL 161 09/21/2017    TRIG 72.0 09/21/2017    HDL 63.60 09/21/2017    LDLDIRECT 207.8 09/29/2010    LDLCALC 83 09/21/2017    ALT 6 09/21/2017    AST 21 09/21/2017    NA 141 09/21/2017    K 4.9 09/21/2017    CL 98 09/21/2017    CREATININE 13.49 (HH) 09/21/2017    BUN 66 (H) 09/21/2017    CO2 27 09/21/2017    TSH 2.81 09/21/2017    HGBA1C 7.1 (H) 09/21/2017        No results found.   Assessment & Plan:    Problem List Items Addressed This Visit       CVA (cerebral vascular accident) ([place])      Using a motorized w/c        Relevant Medications    hydrALAZINE (APRESOLINE) 50 MG tablet    NIFEdipine (ADALAT CC) 90 MG 24 hr tablet    Diabetes mellitus type 2 with complications ([place])      Labs with Nephrology        Relevant Medications    insulin NPH Human (NOVOLIN N) 100 UNIT/ML injection    ESRD (end stage renal disease) on dialysis ([place])       On PD nightly. Labs monthly w/Nephrology Not on a transplant list yet due to her CVA      POC A1c - 12.6% Titrate Novolin up for goal glucose 120-160

## 2021-06-16 NOTE — Assessment & Plan Note (Signed)
Using a motorized w/c

## 2021-06-16 NOTE — Patient Instructions (Signed)
HgbA1c - 12.6%  Titrate Novolin up for goal glucose of 120-160

## 2021-06-16 NOTE — Assessment & Plan Note (Signed)
On Lipitor 

## 2021-06-16 NOTE — Assessment & Plan Note (Signed)
On PD nightly. Labs monthly w/Nephrology Not on a transplant list yet due to her CVA

## 2021-06-16 NOTE — Assessment & Plan Note (Addendum)
Labs with Nephrology Worse Check POC A1c - 12.6% Titrate Novolin N up for goal glucose 120-160

## 2021-06-17 DIAGNOSIS — N2581 Secondary hyperparathyroidism of renal origin: Secondary | ICD-10-CM | POA: Diagnosis not present

## 2021-06-17 DIAGNOSIS — D631 Anemia in chronic kidney disease: Secondary | ICD-10-CM | POA: Diagnosis not present

## 2021-06-17 DIAGNOSIS — N186 End stage renal disease: Secondary | ICD-10-CM | POA: Diagnosis not present

## 2021-06-18 DIAGNOSIS — D631 Anemia in chronic kidney disease: Secondary | ICD-10-CM | POA: Diagnosis not present

## 2021-06-18 DIAGNOSIS — N186 End stage renal disease: Secondary | ICD-10-CM | POA: Diagnosis not present

## 2021-06-18 DIAGNOSIS — N2581 Secondary hyperparathyroidism of renal origin: Secondary | ICD-10-CM | POA: Diagnosis not present

## 2021-06-19 DIAGNOSIS — D631 Anemia in chronic kidney disease: Secondary | ICD-10-CM | POA: Diagnosis not present

## 2021-06-19 DIAGNOSIS — N2581 Secondary hyperparathyroidism of renal origin: Secondary | ICD-10-CM | POA: Diagnosis not present

## 2021-06-19 DIAGNOSIS — N186 End stage renal disease: Secondary | ICD-10-CM | POA: Diagnosis not present

## 2021-06-20 DIAGNOSIS — D631 Anemia in chronic kidney disease: Secondary | ICD-10-CM | POA: Diagnosis not present

## 2021-06-20 DIAGNOSIS — N2581 Secondary hyperparathyroidism of renal origin: Secondary | ICD-10-CM | POA: Diagnosis not present

## 2021-06-20 DIAGNOSIS — N186 End stage renal disease: Secondary | ICD-10-CM | POA: Diagnosis not present

## 2021-06-21 DIAGNOSIS — N2581 Secondary hyperparathyroidism of renal origin: Secondary | ICD-10-CM | POA: Diagnosis not present

## 2021-06-21 DIAGNOSIS — N186 End stage renal disease: Secondary | ICD-10-CM | POA: Diagnosis not present

## 2021-06-21 DIAGNOSIS — D631 Anemia in chronic kidney disease: Secondary | ICD-10-CM | POA: Diagnosis not present

## 2021-06-22 DIAGNOSIS — N2581 Secondary hyperparathyroidism of renal origin: Secondary | ICD-10-CM | POA: Diagnosis not present

## 2021-06-22 DIAGNOSIS — D631 Anemia in chronic kidney disease: Secondary | ICD-10-CM | POA: Diagnosis not present

## 2021-06-22 DIAGNOSIS — N186 End stage renal disease: Secondary | ICD-10-CM | POA: Diagnosis not present

## 2021-06-23 DIAGNOSIS — D631 Anemia in chronic kidney disease: Secondary | ICD-10-CM | POA: Diagnosis not present

## 2021-06-23 DIAGNOSIS — N186 End stage renal disease: Secondary | ICD-10-CM | POA: Diagnosis not present

## 2021-06-23 DIAGNOSIS — N2581 Secondary hyperparathyroidism of renal origin: Secondary | ICD-10-CM | POA: Diagnosis not present

## 2021-06-24 DIAGNOSIS — D631 Anemia in chronic kidney disease: Secondary | ICD-10-CM | POA: Diagnosis not present

## 2021-06-24 DIAGNOSIS — N186 End stage renal disease: Secondary | ICD-10-CM | POA: Diagnosis not present

## 2021-06-24 DIAGNOSIS — N2581 Secondary hyperparathyroidism of renal origin: Secondary | ICD-10-CM | POA: Diagnosis not present

## 2021-06-25 DIAGNOSIS — D631 Anemia in chronic kidney disease: Secondary | ICD-10-CM | POA: Diagnosis not present

## 2021-06-25 DIAGNOSIS — N186 End stage renal disease: Secondary | ICD-10-CM | POA: Diagnosis not present

## 2021-06-25 DIAGNOSIS — Z992 Dependence on renal dialysis: Secondary | ICD-10-CM | POA: Diagnosis not present

## 2021-06-25 DIAGNOSIS — N2581 Secondary hyperparathyroidism of renal origin: Secondary | ICD-10-CM | POA: Diagnosis not present

## 2021-06-26 DIAGNOSIS — N186 End stage renal disease: Secondary | ICD-10-CM | POA: Diagnosis not present

## 2021-06-26 DIAGNOSIS — D631 Anemia in chronic kidney disease: Secondary | ICD-10-CM | POA: Diagnosis not present

## 2021-06-26 DIAGNOSIS — N2581 Secondary hyperparathyroidism of renal origin: Secondary | ICD-10-CM | POA: Diagnosis not present

## 2021-06-27 DIAGNOSIS — N2581 Secondary hyperparathyroidism of renal origin: Secondary | ICD-10-CM | POA: Diagnosis not present

## 2021-06-27 DIAGNOSIS — N186 End stage renal disease: Secondary | ICD-10-CM | POA: Diagnosis not present

## 2021-06-27 DIAGNOSIS — D631 Anemia in chronic kidney disease: Secondary | ICD-10-CM | POA: Diagnosis not present

## 2021-06-28 DIAGNOSIS — N186 End stage renal disease: Secondary | ICD-10-CM | POA: Diagnosis not present

## 2021-06-28 DIAGNOSIS — D631 Anemia in chronic kidney disease: Secondary | ICD-10-CM | POA: Diagnosis not present

## 2021-06-28 DIAGNOSIS — N2581 Secondary hyperparathyroidism of renal origin: Secondary | ICD-10-CM | POA: Diagnosis not present

## 2021-06-29 DIAGNOSIS — E119 Type 2 diabetes mellitus without complications: Secondary | ICD-10-CM | POA: Diagnosis not present

## 2021-06-29 DIAGNOSIS — N2581 Secondary hyperparathyroidism of renal origin: Secondary | ICD-10-CM | POA: Diagnosis not present

## 2021-06-29 DIAGNOSIS — D631 Anemia in chronic kidney disease: Secondary | ICD-10-CM | POA: Diagnosis not present

## 2021-06-29 DIAGNOSIS — R6889 Other general symptoms and signs: Secondary | ICD-10-CM | POA: Diagnosis not present

## 2021-06-29 DIAGNOSIS — N186 End stage renal disease: Secondary | ICD-10-CM | POA: Diagnosis not present

## 2021-06-30 DIAGNOSIS — D631 Anemia in chronic kidney disease: Secondary | ICD-10-CM | POA: Diagnosis not present

## 2021-06-30 DIAGNOSIS — N186 End stage renal disease: Secondary | ICD-10-CM | POA: Diagnosis not present

## 2021-06-30 DIAGNOSIS — N2581 Secondary hyperparathyroidism of renal origin: Secondary | ICD-10-CM | POA: Diagnosis not present

## 2021-07-01 ENCOUNTER — Ambulatory Visit: Payer: Medicaid Other

## 2021-07-01 DIAGNOSIS — N2581 Secondary hyperparathyroidism of renal origin: Secondary | ICD-10-CM | POA: Diagnosis not present

## 2021-07-01 DIAGNOSIS — D631 Anemia in chronic kidney disease: Secondary | ICD-10-CM | POA: Diagnosis not present

## 2021-07-01 DIAGNOSIS — N186 End stage renal disease: Secondary | ICD-10-CM | POA: Diagnosis not present

## 2021-07-02 DIAGNOSIS — N186 End stage renal disease: Secondary | ICD-10-CM | POA: Diagnosis not present

## 2021-07-02 DIAGNOSIS — N2581 Secondary hyperparathyroidism of renal origin: Secondary | ICD-10-CM | POA: Diagnosis not present

## 2021-07-02 DIAGNOSIS — D631 Anemia in chronic kidney disease: Secondary | ICD-10-CM | POA: Diagnosis not present

## 2021-07-03 DIAGNOSIS — N2581 Secondary hyperparathyroidism of renal origin: Secondary | ICD-10-CM | POA: Diagnosis not present

## 2021-07-03 DIAGNOSIS — N186 End stage renal disease: Secondary | ICD-10-CM | POA: Diagnosis not present

## 2021-07-03 DIAGNOSIS — D631 Anemia in chronic kidney disease: Secondary | ICD-10-CM | POA: Diagnosis not present

## 2021-07-04 DIAGNOSIS — N186 End stage renal disease: Secondary | ICD-10-CM | POA: Diagnosis not present

## 2021-07-04 DIAGNOSIS — D631 Anemia in chronic kidney disease: Secondary | ICD-10-CM | POA: Diagnosis not present

## 2021-07-04 DIAGNOSIS — N2581 Secondary hyperparathyroidism of renal origin: Secondary | ICD-10-CM | POA: Diagnosis not present

## 2021-07-05 DIAGNOSIS — N186 End stage renal disease: Secondary | ICD-10-CM | POA: Diagnosis not present

## 2021-07-05 DIAGNOSIS — D631 Anemia in chronic kidney disease: Secondary | ICD-10-CM | POA: Diagnosis not present

## 2021-07-05 DIAGNOSIS — N2581 Secondary hyperparathyroidism of renal origin: Secondary | ICD-10-CM | POA: Diagnosis not present

## 2021-07-06 DIAGNOSIS — D631 Anemia in chronic kidney disease: Secondary | ICD-10-CM | POA: Diagnosis not present

## 2021-07-06 DIAGNOSIS — N186 End stage renal disease: Secondary | ICD-10-CM | POA: Diagnosis not present

## 2021-07-06 DIAGNOSIS — N2581 Secondary hyperparathyroidism of renal origin: Secondary | ICD-10-CM | POA: Diagnosis not present

## 2021-07-07 DIAGNOSIS — N186 End stage renal disease: Secondary | ICD-10-CM | POA: Diagnosis not present

## 2021-07-07 DIAGNOSIS — N2581 Secondary hyperparathyroidism of renal origin: Secondary | ICD-10-CM | POA: Diagnosis not present

## 2021-07-07 DIAGNOSIS — D631 Anemia in chronic kidney disease: Secondary | ICD-10-CM | POA: Diagnosis not present

## 2021-07-08 DIAGNOSIS — N186 End stage renal disease: Secondary | ICD-10-CM | POA: Diagnosis not present

## 2021-07-08 DIAGNOSIS — N2581 Secondary hyperparathyroidism of renal origin: Secondary | ICD-10-CM | POA: Diagnosis not present

## 2021-07-08 DIAGNOSIS — D631 Anemia in chronic kidney disease: Secondary | ICD-10-CM | POA: Diagnosis not present

## 2021-07-09 DIAGNOSIS — N2581 Secondary hyperparathyroidism of renal origin: Secondary | ICD-10-CM | POA: Diagnosis not present

## 2021-07-09 DIAGNOSIS — D631 Anemia in chronic kidney disease: Secondary | ICD-10-CM | POA: Diagnosis not present

## 2021-07-09 DIAGNOSIS — N186 End stage renal disease: Secondary | ICD-10-CM | POA: Diagnosis not present

## 2021-07-10 DIAGNOSIS — N186 End stage renal disease: Secondary | ICD-10-CM | POA: Diagnosis not present

## 2021-07-10 DIAGNOSIS — N2581 Secondary hyperparathyroidism of renal origin: Secondary | ICD-10-CM | POA: Diagnosis not present

## 2021-07-10 DIAGNOSIS — D631 Anemia in chronic kidney disease: Secondary | ICD-10-CM | POA: Diagnosis not present

## 2021-07-11 ENCOUNTER — Ambulatory Visit (INDEPENDENT_AMBULATORY_CARE_PROVIDER_SITE_OTHER): Payer: Medicare HMO

## 2021-07-11 DIAGNOSIS — N2581 Secondary hyperparathyroidism of renal origin: Secondary | ICD-10-CM | POA: Diagnosis not present

## 2021-07-11 DIAGNOSIS — Z780 Asymptomatic menopausal state: Secondary | ICD-10-CM

## 2021-07-11 DIAGNOSIS — Z0000 Encounter for general adult medical examination without abnormal findings: Secondary | ICD-10-CM | POA: Diagnosis not present

## 2021-07-11 DIAGNOSIS — D631 Anemia in chronic kidney disease: Secondary | ICD-10-CM | POA: Diagnosis not present

## 2021-07-11 DIAGNOSIS — N186 End stage renal disease: Secondary | ICD-10-CM | POA: Diagnosis not present

## 2021-07-11 NOTE — Progress Notes (Addendum)
Subjective:   Mary Valencia is a 66 y.o. female who presents for Medicare Annual (Subsequent) preventive examination.  I connected with Mary Valencia today by telephone and verified that I am speaking with the correct person using two identifiers. Location patient: home Location provider: work Persons participating in the virtual visit: patient, provider.   I discussed the limitations, risks, security and privacy concerns of performing an evaluation and management service by telephone and the availability of in person appointments. I also discussed with the patient that there may be a patient responsible charge related to this service. The patient expressed understanding and verbally consented to this telephonic visit.    Interactive audio and video telecommunications were attempted between this provider and patient, however failed, due to patient having technical difficulties OR patient did not have access to video capability.  We continued and completed visit with audio only.    Review of Systems     Cardiac Risk Factors include: advanced age (>60men, >90 women);diabetes mellitus;dyslipidemia;hypertension     Objective:    Today's Vitals   There is no height or weight on file to calculate BMI.  Advanced Directives 07/11/2021 07/21/2020 05/24/2020 04/04/2019 04/02/2018 11/27/2016 01/29/2012  Does Patient Have a Medical Advance Directive? Yes Yes Yes No No No Patient does not have advance directive  Type of Advance Directive Healthcare Power of [place]  Does patient want to make changes to medical advance directive? - - No - Patient declined Yes (ED - Information included in AVS) Yes (ED - Information included in AVS) - -  Copy of [place] in Chart? No - copy requested No - copy requested - - - - -  Would patient like information on creating a medical advance directive? - - - - - No - Patient declined -     Current Medications (verified) Outpatient Encounter Medications as of 07/11/2021  Medication Sig   Alcohol Swabs (B-D SINGLE USE SWABS REGULAR) PADS Use as directed to clean site to check blood sugars   calcitRIOL (ROCALTROL) 0.5 MCG capsule Take by mouth. TAKE 1 BY MOUTH DAILY   Continuous Blood Gluc Receiver (FREESTYLE LIBRE 14 DAY READER) DEVI 1 Units by Does not apply route daily.   Continuous Blood Gluc Sensor (FREESTYLE LIBRE 14 DAY SENSOR) MISC 1 Units by Does not apply route every 14 (fourteen) days.   diphenhydrAMINE (BENADRYL) 25 mg capsule Take 25 mg by mouth as needed.   glucose blood test strip TEST BLOOD GLUCOSE THREE TIMES DAILY   hydrALAZINE (APRESOLINE) 50 MG tablet TAKE 1 BY  MOUTH TWICE A DAY   insulin NPH Human (NOVOLIN N) 100 UNIT/ML injection Inject into the skin. Inject 8 Units into the skin 2 times daily with meals   losartan (COZAAR) 50 MG tablet Take 1 tablet (50 mg total) by mouth daily. (Patient taking differently: Take 50 mg by mouth in the morning and at bedtime.)   NIFEdipine (ADALAT CC) 90 MG 24 hr tablet TAKE 1 BY MOUTH DAILY   omeprazole (PRILOSEC) 40 MG capsule TAKE 1 CAPSULE EVERY DAY   polyethylene glycol (MIRALAX / GLYCOLAX) packet Take 17 g by mouth.   senna-docusate (SENOKOT-S) 8.6-50 MG tablet Take by mouth 2 (two) times daily. Take 2 tab twice daily.   VELPHORO 500 MG chewable tablet CHEW 1 THREE TIMES A DAY WITH MEALS   atorvastatin (LIPITOR) 40 MG tablet TAKE 1 TABLET EVERY DAY (FOLLOW  UP APPOINTMENT DUE IN FEBRUARY WITH LIPID CHECK MUST SEE PROVIDER FOR FUTURE REFILLS) (Patient not taking: Reported on 07/11/2021)   hydrALAZINE (APRESOLINE) 100 MG tablet Take 1 tablet (100 mg total) by mouth 3 (three) times daily. (Patient not taking: Reported on 07/11/2021)   labetalol (NORMODYNE) 200 MG tablet Take 1 tablet (200 mg total) by mouth 3 (three) times daily. (Patient not taking: Reported on 07/11/2021)   PRESCRIPTION MEDICATION 210 mg 3 (three) times  daily before meals.  (Patient not taking: Reported on 07/11/2021)   No facility-administered encounter medications on file as of 07/11/2021.    Allergies (verified) Diltiazem, Betadine [povidone iodine], Clonidine derivatives, Gabapentin, Iodine, Penicillins, Sulfasalazine, Sulfonamide derivatives, Tetracyclines & related, Ceftriaxone, Ciprofloxacin, and Naproxen   History: Past Medical History:  Diagnosis Date   Allergic rhinitis    Chronic kidney disease    currently on dialysis   Edema 10/27/2012   L>R   GERD (gastroesophageal reflux disease)    HTN (hypertension)    Hyperlipidemia    Type II or unspecified type diabetes mellitus without mention of complication, not stated as uncontrolled    Past Surgical History:  Procedure Laterality Date   ABDOMINAL HYSTERECTOMY     partial   KIDNEY SURGERY     right removed   NEPHRECTOMY     right - related to untreated strep infection and stones   TONSILLECTOMY     Family History  Problem Relation Age of Onset   Heart disease Mother 66       MI   Heart disease Father 50       MI   Alcohol abuse Sister    Kidney disease Sister    Heart disease Brother        ?CHF ?CAD   Heart disease Maternal Aunt    Diabetes Other    Social History   Socioeconomic History   Marital status: Widowed    Spouse name: Not on file   Number of children: 5   Years of education: Not on file   Highest education level: Not on file  Occupational History   Occupation: disability  Tobacco Use   Smoking status: Never   Smokeless tobacco: Never  Vaping Use   Vaping Use: Never used  Substance and Sexual Activity   Alcohol use: No   Drug use: No   Sexual activity: Yes  Other Topics Concern   Not on file  Social History Narrative   Getting Masters in counseling, 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   Moved to [place]    Social Determinants of Health   Financial Resource Strain: Low Risk    Difficulty of Paying Living Expenses: Not hard at all  Food  Insecurity: No Food Insecurity   Worried About Charity fundraiser in the Last Year: Never true   Arboriculturist in the Last Year: Never true  Transportation Needs: No Transportation Needs   Lack of Transportation (Medical): No   Lack of Transportation (Non-Medical): No  Physical Activity: Inactive   Days of Exercise per Week: 0 days   Minutes of Exercise per Session: 0 min  Stress: No Stress Concern Present   Feeling of Stress : Not at all  Social Connections: Moderately Isolated   Frequency of Communication with Friends and Family: Twice a week   Frequency of Social Gatherings with Friends and Family: Twice a week   Attends Religious Services: 1 to 4 times per year   Active Member of Clubs or  Organizations: No   Attends Archivist Meetings: Never   Marital Status: Widowed    Tobacco Counseling Counseling given: Not Answered   Clinical Intake:  Pre-visit preparation completed: Yes  Pain : No/denies pain     Nutritional Risks: None Diabetes: Yes CBG done?: No Did pt. bring in CBG monitor from home?: No  How often do you need to have someone help you when you read instructions, pamphlets, or other written materials from your doctor or pharmacy?: 1 - Never What is the last grade level you completed in school?: master  Diabetic?yes Nutrition Risk Assessment:  Has the patient had any N/V/D within the last 2 months?  No  Does the patient have any non-healing wounds?  No  Has the patient had any unintentional weight loss or weight gain?  No   Diabetes:  Is the patient diabetic?  Yes  If diabetic, was a CBG obtained today?  No  Did the patient bring in their glucometer from home?  No  How often do you monitor your CBG's? 3 x day .   Financial Strains and Diabetes Management:  Are you having any financial strains with the device, your supplies or your medication? No .  Does the patient want to be seen by Chronic Care Management for management of their  diabetes?  No  Would the patient like to be referred to a Nutritionist or for Diabetic Management?  No   Diabetic Exams:  Diabetic Eye Exam: Overdue for diabetic eye exam. Pt has been advised about the importance in completing this exam. Patient advised to call and schedule an eye exam. Diabetic Foot Exam: Overdue, Pt has been advised about the importance in completing this exam. Pt is scheduled for diabetic foot exam on next office visit .   Interpreter Needed?: No  Information entered by :: [place] of Daily Living In your present state of health, do you have any difficulty performing the following activities: 07/11/2021  Hearing? N  Vision? N  Difficulty concentrating or making decisions? N  Walking or climbing stairs? N  Dressing or bathing? N  Doing errands, shopping? N  Preparing Food and eating ? N  Using the Toilet? N  In the past six months, have you accidently leaked urine? N  Do you have problems with loss of bowel control? N  Managing your Medications? N  Managing your Finances? N  Housekeeping or managing your Housekeeping? N  Some recent data might be hidden    Patient Care Team: Plotnikov, Evie Lacks, MD as PCP - General Ninfa Linden Lind Guest, MD (Orthopedic Surgery) Boyd Kerbs, MD as Referring Physician (Neurology) Mezer, Nadara Mustard, MD (Obstetrics and Gynecology) Thereasa Distance, MD as Referring Physician (Nephrology) Curly Rim, MD as Referring Physician (Internal Medicine) Delice Bison Darnelle Maffucci, [facility] as Pharmacist (Pharmacist)  Indicate any recent Medical Services you may have received from other than [facility] providers in the past year (date may be approximate).     Assessment:   This is a routine wellness examination for Mary Valencia.  Hearing/Vision screen Vision Screening - Comments:: Annual eye exams wear glasses  Dietary issues and exercise activities discussed: Current Exercise Habits: The patient does not participate in regular  exercise at present, Exercise limited by: cardiac condition(s)   Goals Addressed   None    Depression Screen PHQ 2/9 Scores 07/11/2021 06/16/2021 05/24/2020 04/06/2020 04/04/2019 04/02/2018 03/28/2017  PHQ - 2 Score 0 0 0 0 1 1 0  PHQ- 9 Score - 0 - -  3 5 -    Fall Risk Fall Risk  07/11/2021 06/16/2021 05/24/2020 04/06/2020 04/04/2019  Falls in the past year? 0 1 0 1 0  Number falls in past yr: 0 0 0 0 0  Injury with Fall? 0 0 0 0 0  Risk for fall due to : (No Data) Impaired balance/gait;Impaired mobility No Fall Risks No Fall Risks History of fall(s);Impaired balance/gait;Impaired mobility  Risk for fall due to: Comment cane Gilford Rile /wheelchair - - - -  Follow up Falls evaluation completed - Falls evaluation completed Falls evaluation completed Falls prevention discussed    FALL RISK PREVENTION PERTAINING TO THE HOME:  Any stairs in or around the home? No  If so, are there any without handrails? No  Home free of loose throw rugs in walkways, pet beds, electrical cords, etc? Yes  Adequate lighting in your home to reduce risk of falls? Yes   ASSISTIVE DEVICES UTILIZED TO PREVENT FALLS:  Life alert? No  Use of a cane, walker or w/c? Yes  Grab bars in the bathroom? Yes  Shower chair or bench in shower? Yes  Elevated toilet seat or a handicapped toilet? Yes     Cognitive Function:  Normal cognitive status assessed by direct observation by this Nurse Health Advisor. No abnormalities found.     6CIT Screen 05/24/2020  What Year? 0 points  What month? 0 points  What time? 0 points  Count back from 20 0 points  Months in reverse 0 points  Repeat phrase 0 points  Total Score 0    Immunizations Immunization History  Administered Date(s) Administered   Fluad Quad(high Dose 65+) 04/26/2020   Influenza Whole 05/16/2005   Influenza, High Dose Seasonal PF 03/26/2021   Influenza-Unspecified 03/26/2018   Pneumococcal Polysaccharide-23 10/08/2016   Td 09/26/2004, 08/25/2011,  02/24/2017    TDAP status: Up to date  Flu Vaccine status: Up to date  Pneumococcal vaccine status: Up to date  Covid-19 vaccine status: Declined, Education has been provided regarding the importance of this vaccine but patient still declined. Advised may receive this vaccine at local pharmacy or Health Dept.or vaccine clinic. Aware to provide a copy of the vaccination record if obtained from local pharmacy or Health Dept. Verbalized acceptance and understanding.  Qualifies for Shingles Vaccine? Yes   Zostavax completed No   Shingrix Completed?: No.    Education has been provided regarding the importance of this vaccine. Patient has been advised to call insurance company to determine out of pocket expense if they have not yet received this vaccine. Advised may also receive vaccine at local pharmacy or Health Dept. Verbalized acceptance and understanding.  Screening Tests Health Maintenance  Topic Date Due   COVID-19 Vaccine (1) Never done   OPHTHALMOLOGY EXAM  Never done   Zoster Vaccines- Shingrix (1 of 2) Never done   Pneumonia Vaccine 45+ Years old (2 - PCV) 10/08/2017   MAMMOGRAM  10/09/2017   FOOT EXAM  04/03/2019   DEXA SCAN  Never done   HEMOGLOBIN A1C  12/15/2021   COLONOSCOPY (Pts 45-76yrs Insurance coverage will need to be confirmed)  12/16/2026   TETANUS/TDAP  02/25/2027   INFLUENZA VACCINE  Completed   Hepatitis C Screening  Completed   HPV VACCINES  Aged Out    Health Maintenance  Health Maintenance Due  Topic Date Due   COVID-19 Vaccine (1) Never done   OPHTHALMOLOGY EXAM  Never done   Zoster Vaccines- Shingrix (1 of 2) Never done   Pneumonia  Vaccine 72+ Years old (2 - PCV) 10/08/2017   MAMMOGRAM  10/09/2017   FOOT EXAM  04/03/2019   DEXA SCAN  Never done    Colorectal cancer screening: Type of screening: Colonoscopy. Completed 12/15/2016. Repeat every 10 years  Mammogram status: Ordered 10/30/2020. Pt provided with contact info and advised to call to  schedule appt.   Bone Density status: Ordered 07/11/2021. Pt provided with contact info and advised to call to schedule appt.  Lung Cancer Screening: (Low Dose CT Chest recommended if Age 16-80 years, 30 pack-year currently smoking OR have quit w/in 15years.) does not qualify.   Lung Cancer Screening Referral: n/a  Additional Screening:  Hepatitis C Screening: does not qualify; Completed 09/21/2017  Vision Screening: Recommended annual ophthalmology exams for early detection of glaucoma and other disorders of the eye. Is the patient up to date with their annual eye exam?  Yes  Who is the provider or what is the name of the office in which the patient attends annual eye exams? [facility]  If pt is not established with a provider, would they like to be referred to a provider to establish care? No .   Dental Screening: Recommended annual dental exams for proper oral hygiene  Community Resource Referral / Chronic Care Management: CRR required this visit?  No   CCM required this visit?  No      Plan:     I have personally reviewed and noted the following in the patients chart:   Medical and social history Use of alcohol, tobacco or illicit drugs  Current medications and supplements including opioid prescriptions.  Functional ability and status Nutritional status Physical activity Advanced directives List of other physicians Hospitalizations, surgeries, and ER visits in previous 12 months Vitals Screenings to include cognitive, depression, and falls Referrals and appointments  In addition, I have reviewed and discussed with patient certain preventive protocols, quality metrics, and best practice recommendations. A written personalized care plan for preventive services as well as general preventive health recommendations were provided to patient.     Randel Pigg, LPN   0/51/1021   Nurse Notes: none    Medical screening examination/treatment/procedure(s) were performed by  non-physician practitioner and as supervising physician I was immediately available for consultation/collaboration.  I agree with above. Lew Dawes, MD

## 2021-07-11 NOTE — Patient Instructions (Signed)
Mary Valencia , Thank you for taking time to come for your Medicare Wellness Visit. I appreciate your ongoing commitment to your health goals. Please review the following plan we discussed and let me know if I can assist you in the future.   Screening recommendations/referrals: Colonoscopy: 12/15/2016 Mammogram: ordered 10/30/2020 Bone Density: ordered 07/08/2021 Recommended yearly ophthalmology/optometry visit for glaucoma screening and checkup Recommended yearly dental visit for hygiene and checkup  Vaccinations: Influenza vaccine: completed  Pneumococcal vaccine: completed  Tdap vaccine: 02/24/2017 Shingles vaccine: will consider     Advanced directives: yes   Conditions/risks identified: none   Next appointment: none    Preventive Care 19 Years and Older, Female Preventive care refers to lifestyle choices and visits with your health care provider that can promote health and wellness. What does preventive care include? A yearly physical exam. This is also called an annual well check. Dental exams once or twice a year. Routine eye exams. Ask your health care provider how often you should have your eyes checked. Personal lifestyle choices, including: Daily care of your teeth and gums. Regular physical activity. Eating a healthy diet. Avoiding tobacco and drug use. Limiting alcohol use. Practicing safe sex. Taking low-dose aspirin every day. Taking vitamin and mineral supplements as recommended by your health care provider. What happens during an annual well check? The services and screenings done by your health care provider during your annual well check will depend on your age, overall health, lifestyle risk factors, and family history of disease. Counseling  Your health care provider may ask you questions about your: Alcohol use. Tobacco use. Drug use. Emotional well-being. Home and relationship well-being. Sexual activity. Eating habits. History of falls. Memory  and ability to understand (cognition). Work and work Statistician. Reproductive health. Screening  You may have the following tests or measurements: Height, weight, and BMI. Blood pressure. Lipid and cholesterol levels. These may be checked every 5 years, or more frequently if you are over 21 years old. Skin check. Lung cancer screening. You may have this screening every year starting at age 14 if you have a 30-pack-year history of smoking and currently smoke or have quit within the past 15 years. Fecal occult blood test (FOBT) of the stool. You may have this test every year starting at age 67. Flexible sigmoidoscopy or colonoscopy. You may have a sigmoidoscopy every 5 years or a colonoscopy every 10 years starting at age 59. Hepatitis C blood test. Hepatitis B blood test. Sexually transmitted disease (STD) testing. Diabetes screening. This is done by checking your blood sugar (glucose) after you have not eaten for a while (fasting). You may have this done every 1-3 years. Bone density scan. This is done to screen for osteoporosis. You may have this done starting at age 30. Mammogram. This may be done every 1-2 years. Talk to your health care provider about how often you should have regular mammograms. Talk with your health care provider about your test results, treatment options, and if necessary, the need for more tests. Vaccines  Your health care provider may recommend certain vaccines, such as: Influenza vaccine. This is recommended every year. Tetanus, diphtheria, and acellular pertussis (Tdap, Td) vaccine. You may need a Td booster every 10 years. Zoster vaccine. You may need this after age 28. Pneumococcal 13-valent conjugate (PCV13) vaccine. One dose is recommended after age 24. Pneumococcal polysaccharide (PPSV23) vaccine. One dose is recommended after age 78. Talk to your health care provider about which screenings and vaccines you need and  how often you need them. This  information is not intended to replace advice given to you by your health care provider. Make sure you discuss any questions you have with your health care provider. Document Released: 07/09/2015 Document Revised: 03/01/2016 Document Reviewed: 04/13/2015 Elsevier Interactive Patient Education  2017 [place] Prevention in the Home Falls can cause injuries. They can happen to people of all ages. There are many things you can do to make your home safe and to help prevent falls. What can I do on the outside of my home? Regularly fix the edges of walkways and driveways and fix any cracks. Remove anything that might make you trip as you walk through a door, such as a raised step or threshold. Trim any bushes or trees on the path to your home. Use bright outdoor lighting. Clear any walking paths of anything that might make someone trip, such as rocks or tools. Regularly check to see if handrails are loose or broken. Make sure that both sides of any steps have handrails. Any raised decks and porches should have guardrails on the edges. Have any leaves, snow, or ice cleared regularly. Use sand or salt on walking paths during winter. Clean up any spills in your garage right away. This includes oil or grease spills. What can I do in the bathroom? Use night lights. Install grab bars by the toilet and in the tub and shower. Do not use towel bars as grab bars. Use non-skid mats or decals in the tub or shower. If you need to sit down in the shower, use a plastic, non-slip stool. Keep the floor dry. Clean up any water that spills on the floor as soon as it happens. Remove soap buildup in the tub or shower regularly. Attach bath mats securely with double-sided non-slip rug tape. Do not have throw rugs and other things on the floor that can make you trip. What can I do in the bedroom? Use night lights. Make sure that you have a light by your bed that is easy to reach. Do not use any sheets or  blankets that are too big for your bed. They should not hang down onto the floor. Have a firm chair that has side arms. You can use this for support while you get dressed. Do not have throw rugs and other things on the floor that can make you trip. What can I do in the kitchen? Clean up any spills right away. Avoid walking on wet floors. Keep items that you use a lot in easy-to-reach places. If you need to reach something above you, use a strong step stool that has a grab bar. Keep electrical cords out of the way. Do not use floor polish or wax that makes floors slippery. If you must use wax, use non-skid floor wax. Do not have throw rugs and other things on the floor that can make you trip. What can I do with my stairs? Do not leave any items on the stairs. Make sure that there are handrails on both sides of the stairs and use them. Fix handrails that are broken or loose. Make sure that handrails are as long as the stairways. Check any carpeting to make sure that it is firmly attached to the stairs. Fix any carpet that is loose or worn. Avoid having throw rugs at the top or bottom of the stairs. If you do have throw rugs, attach them to the floor with carpet tape. Make sure that you have  a light switch at the top of the stairs and the bottom of the stairs. If you do not have them, ask someone to add them for you. What else can I do to help prevent falls? Wear shoes that: Do not have high heels. Have rubber bottoms. Are comfortable and fit you well. Are closed at the toe. Do not wear sandals. If you use a stepladder: Make sure that it is fully opened. Do not climb a closed stepladder. Make sure that both sides of the stepladder are locked into place. Ask someone to hold it for you, if possible. Clearly mark and make sure that you can see: Any grab bars or handrails. First and last steps. Where the edge of each step is. Use tools that help you move around (mobility aids) if they are  needed. These include: Canes. Walkers. Scooters. Crutches. Turn on the lights when you go into a dark area. Replace any light bulbs as soon as they burn out. Set up your furniture so you have a clear path. Avoid moving your furniture around. If any of your floors are uneven, fix them. If there are any pets around you, be aware of where they are. Review your medicines with your doctor. Some medicines can make you feel dizzy. This can increase your chance of falling. Ask your doctor what other things that you can do to help prevent falls. This information is not intended to replace advice given to you by your health care provider. Make sure you discuss any questions you have with your health care provider. Document Released: 04/08/2009 Document Revised: 11/18/2015 Document Reviewed: 07/17/2014 Elsevier Interactive Patient Education  2017 [employer].

## 2021-07-12 DIAGNOSIS — N186 End stage renal disease: Secondary | ICD-10-CM | POA: Diagnosis not present

## 2021-07-12 DIAGNOSIS — D631 Anemia in chronic kidney disease: Secondary | ICD-10-CM | POA: Diagnosis not present

## 2021-07-12 DIAGNOSIS — N2581 Secondary hyperparathyroidism of renal origin: Secondary | ICD-10-CM | POA: Diagnosis not present

## 2021-07-13 DIAGNOSIS — N3946 Mixed incontinence: Secondary | ICD-10-CM | POA: Diagnosis not present

## 2021-07-13 DIAGNOSIS — N189 Chronic kidney disease, unspecified: Secondary | ICD-10-CM | POA: Diagnosis not present

## 2021-07-13 DIAGNOSIS — N186 End stage renal disease: Secondary | ICD-10-CM | POA: Diagnosis not present

## 2021-07-13 DIAGNOSIS — N2581 Secondary hyperparathyroidism of renal origin: Secondary | ICD-10-CM | POA: Diagnosis not present

## 2021-07-13 DIAGNOSIS — D631 Anemia in chronic kidney disease: Secondary | ICD-10-CM | POA: Diagnosis not present

## 2021-07-14 DIAGNOSIS — N2581 Secondary hyperparathyroidism of renal origin: Secondary | ICD-10-CM | POA: Diagnosis not present

## 2021-07-14 DIAGNOSIS — N186 End stage renal disease: Secondary | ICD-10-CM | POA: Diagnosis not present

## 2021-07-14 DIAGNOSIS — D631 Anemia in chronic kidney disease: Secondary | ICD-10-CM | POA: Diagnosis not present

## 2021-07-15 DIAGNOSIS — N2581 Secondary hyperparathyroidism of renal origin: Secondary | ICD-10-CM | POA: Diagnosis not present

## 2021-07-15 DIAGNOSIS — D631 Anemia in chronic kidney disease: Secondary | ICD-10-CM | POA: Diagnosis not present

## 2021-07-15 DIAGNOSIS — N186 End stage renal disease: Secondary | ICD-10-CM | POA: Diagnosis not present

## 2021-07-16 DIAGNOSIS — N2581 Secondary hyperparathyroidism of renal origin: Secondary | ICD-10-CM | POA: Diagnosis not present

## 2021-07-16 DIAGNOSIS — N186 End stage renal disease: Secondary | ICD-10-CM | POA: Diagnosis not present

## 2021-07-16 DIAGNOSIS — D631 Anemia in chronic kidney disease: Secondary | ICD-10-CM | POA: Diagnosis not present

## 2021-07-17 DIAGNOSIS — D631 Anemia in chronic kidney disease: Secondary | ICD-10-CM | POA: Diagnosis not present

## 2021-07-17 DIAGNOSIS — N186 End stage renal disease: Secondary | ICD-10-CM | POA: Diagnosis not present

## 2021-07-17 DIAGNOSIS — N2581 Secondary hyperparathyroidism of renal origin: Secondary | ICD-10-CM | POA: Diagnosis not present

## 2021-07-18 DIAGNOSIS — N186 End stage renal disease: Secondary | ICD-10-CM | POA: Diagnosis not present

## 2021-07-18 DIAGNOSIS — D631 Anemia in chronic kidney disease: Secondary | ICD-10-CM | POA: Diagnosis not present

## 2021-07-18 DIAGNOSIS — N2581 Secondary hyperparathyroidism of renal origin: Secondary | ICD-10-CM | POA: Diagnosis not present

## 2021-07-19 DIAGNOSIS — N186 End stage renal disease: Secondary | ICD-10-CM | POA: Diagnosis not present

## 2021-07-19 DIAGNOSIS — D631 Anemia in chronic kidney disease: Secondary | ICD-10-CM | POA: Diagnosis not present

## 2021-07-19 DIAGNOSIS — N2581 Secondary hyperparathyroidism of renal origin: Secondary | ICD-10-CM | POA: Diagnosis not present

## 2021-07-20 DIAGNOSIS — D631 Anemia in chronic kidney disease: Secondary | ICD-10-CM | POA: Diagnosis not present

## 2021-07-20 DIAGNOSIS — N186 End stage renal disease: Secondary | ICD-10-CM | POA: Diagnosis not present

## 2021-07-20 DIAGNOSIS — N2581 Secondary hyperparathyroidism of renal origin: Secondary | ICD-10-CM | POA: Diagnosis not present

## 2021-07-21 DIAGNOSIS — N186 End stage renal disease: Secondary | ICD-10-CM | POA: Diagnosis not present

## 2021-07-21 DIAGNOSIS — N2581 Secondary hyperparathyroidism of renal origin: Secondary | ICD-10-CM | POA: Diagnosis not present

## 2021-07-21 DIAGNOSIS — D631 Anemia in chronic kidney disease: Secondary | ICD-10-CM | POA: Diagnosis not present

## 2021-07-22 DIAGNOSIS — N2581 Secondary hyperparathyroidism of renal origin: Secondary | ICD-10-CM | POA: Diagnosis not present

## 2021-07-22 DIAGNOSIS — D631 Anemia in chronic kidney disease: Secondary | ICD-10-CM | POA: Diagnosis not present

## 2021-07-22 DIAGNOSIS — N186 End stage renal disease: Secondary | ICD-10-CM | POA: Diagnosis not present

## 2021-07-23 DIAGNOSIS — N186 End stage renal disease: Secondary | ICD-10-CM | POA: Diagnosis not present

## 2021-07-23 DIAGNOSIS — N2581 Secondary hyperparathyroidism of renal origin: Secondary | ICD-10-CM | POA: Diagnosis not present

## 2021-07-23 DIAGNOSIS — D631 Anemia in chronic kidney disease: Secondary | ICD-10-CM | POA: Diagnosis not present

## 2021-07-24 DIAGNOSIS — D631 Anemia in chronic kidney disease: Secondary | ICD-10-CM | POA: Diagnosis not present

## 2021-07-24 DIAGNOSIS — N2581 Secondary hyperparathyroidism of renal origin: Secondary | ICD-10-CM | POA: Diagnosis not present

## 2021-07-24 DIAGNOSIS — N186 End stage renal disease: Secondary | ICD-10-CM | POA: Diagnosis not present

## 2021-07-25 DIAGNOSIS — N186 End stage renal disease: Secondary | ICD-10-CM | POA: Diagnosis not present

## 2021-07-25 DIAGNOSIS — N2581 Secondary hyperparathyroidism of renal origin: Secondary | ICD-10-CM | POA: Diagnosis not present

## 2021-07-25 DIAGNOSIS — D631 Anemia in chronic kidney disease: Secondary | ICD-10-CM | POA: Diagnosis not present

## 2021-07-26 DIAGNOSIS — D631 Anemia in chronic kidney disease: Secondary | ICD-10-CM | POA: Diagnosis not present

## 2021-07-26 DIAGNOSIS — N2581 Secondary hyperparathyroidism of renal origin: Secondary | ICD-10-CM | POA: Diagnosis not present

## 2021-07-26 DIAGNOSIS — Z992 Dependence on renal dialysis: Secondary | ICD-10-CM | POA: Diagnosis not present

## 2021-07-26 DIAGNOSIS — N186 End stage renal disease: Secondary | ICD-10-CM | POA: Diagnosis not present

## 2021-07-27 DIAGNOSIS — N2581 Secondary hyperparathyroidism of renal origin: Secondary | ICD-10-CM | POA: Diagnosis not present

## 2021-07-27 DIAGNOSIS — N186 End stage renal disease: Secondary | ICD-10-CM | POA: Diagnosis not present

## 2021-07-27 DIAGNOSIS — D631 Anemia in chronic kidney disease: Secondary | ICD-10-CM | POA: Diagnosis not present

## 2021-07-28 DIAGNOSIS — N186 End stage renal disease: Secondary | ICD-10-CM | POA: Diagnosis not present

## 2021-07-28 DIAGNOSIS — D631 Anemia in chronic kidney disease: Secondary | ICD-10-CM | POA: Diagnosis not present

## 2021-07-28 DIAGNOSIS — N2581 Secondary hyperparathyroidism of renal origin: Secondary | ICD-10-CM | POA: Diagnosis not present

## 2021-07-29 ENCOUNTER — Telehealth: Payer: Self-pay | Admitting: Internal Medicine

## 2021-07-29 DIAGNOSIS — D631 Anemia in chronic kidney disease: Secondary | ICD-10-CM | POA: Diagnosis not present

## 2021-07-29 DIAGNOSIS — N2581 Secondary hyperparathyroidism of renal origin: Secondary | ICD-10-CM | POA: Diagnosis not present

## 2021-07-29 DIAGNOSIS — N186 End stage renal disease: Secondary | ICD-10-CM | POA: Diagnosis not present

## 2021-07-29 NOTE — Telephone Encounter (Signed)
Pls sch OV w/any provider to evaluate Thx

## 2021-07-29 NOTE — Telephone Encounter (Signed)
Pt states she has a pressure womb on her rear since 2-2, pt states she has been applying a&d ointment  Pt requesting an order for a [facility] nurse aide, pt states she is currently taking physical therapy but does not know the name of the agency

## 2021-07-29 NOTE — Telephone Encounter (Signed)
MD is out of the office until Tuesday 08/02/21 pls advise on msg below.Marland KitchenChryl Heck

## 2021-07-29 NOTE — Telephone Encounter (Signed)
I would recommend this wait for PCP. If she is having concerns about the ulcer I would strongly recommend visit as she has poorly controlled diabetes and is at high risk of wound infection/poor healing.

## 2021-07-30 DIAGNOSIS — D631 Anemia in chronic kidney disease: Secondary | ICD-10-CM | POA: Diagnosis not present

## 2021-07-30 DIAGNOSIS — N2581 Secondary hyperparathyroidism of renal origin: Secondary | ICD-10-CM | POA: Diagnosis not present

## 2021-07-30 DIAGNOSIS — N186 End stage renal disease: Secondary | ICD-10-CM | POA: Diagnosis not present

## 2021-07-31 DIAGNOSIS — D631 Anemia in chronic kidney disease: Secondary | ICD-10-CM | POA: Diagnosis not present

## 2021-07-31 DIAGNOSIS — N186 End stage renal disease: Secondary | ICD-10-CM | POA: Diagnosis not present

## 2021-07-31 DIAGNOSIS — N2581 Secondary hyperparathyroidism of renal origin: Secondary | ICD-10-CM | POA: Diagnosis not present

## 2021-08-01 DIAGNOSIS — N2581 Secondary hyperparathyroidism of renal origin: Secondary | ICD-10-CM | POA: Diagnosis not present

## 2021-08-01 DIAGNOSIS — N186 End stage renal disease: Secondary | ICD-10-CM | POA: Diagnosis not present

## 2021-08-01 DIAGNOSIS — D631 Anemia in chronic kidney disease: Secondary | ICD-10-CM | POA: Diagnosis not present

## 2021-08-02 DIAGNOSIS — N186 End stage renal disease: Secondary | ICD-10-CM | POA: Diagnosis not present

## 2021-08-02 DIAGNOSIS — E119 Type 2 diabetes mellitus without complications: Secondary | ICD-10-CM | POA: Diagnosis not present

## 2021-08-02 DIAGNOSIS — N2581 Secondary hyperparathyroidism of renal origin: Secondary | ICD-10-CM | POA: Diagnosis not present

## 2021-08-02 DIAGNOSIS — D631 Anemia in chronic kidney disease: Secondary | ICD-10-CM | POA: Diagnosis not present

## 2021-08-02 DIAGNOSIS — Z1159 Encounter for screening for other viral diseases: Secondary | ICD-10-CM | POA: Diagnosis not present

## 2021-08-02 NOTE — Telephone Encounter (Signed)
Appointment has been scheduled.

## 2021-08-03 ENCOUNTER — Encounter: Payer: Self-pay | Admitting: Internal Medicine

## 2021-08-03 ENCOUNTER — Other Ambulatory Visit: Payer: Self-pay

## 2021-08-03 ENCOUNTER — Ambulatory Visit (INDEPENDENT_AMBULATORY_CARE_PROVIDER_SITE_OTHER): Payer: Medicare Other | Admitting: Internal Medicine

## 2021-08-03 DIAGNOSIS — Z8673 Personal history of transient ischemic attack (TIA), and cerebral infarction without residual deficits: Secondary | ICD-10-CM

## 2021-08-03 DIAGNOSIS — N2581 Secondary hyperparathyroidism of renal origin: Secondary | ICD-10-CM | POA: Diagnosis not present

## 2021-08-03 DIAGNOSIS — N186 End stage renal disease: Secondary | ICD-10-CM | POA: Diagnosis not present

## 2021-08-03 DIAGNOSIS — E118 Type 2 diabetes mellitus with unspecified complications: Secondary | ICD-10-CM | POA: Diagnosis not present

## 2021-08-03 DIAGNOSIS — D631 Anemia in chronic kidney disease: Secondary | ICD-10-CM | POA: Diagnosis not present

## 2021-08-03 DIAGNOSIS — L89151 Pressure ulcer of sacral region, stage 1: Secondary | ICD-10-CM

## 2021-08-03 MED ORDER — MUPIROCIN CALCIUM 2 % EX CREA: 1.0000 "application " | TOPICAL_CREAM | Freq: Every day | CUTANEOUS | 3 refills | Status: DC

## 2021-08-03 NOTE — Patient Instructions (Signed)
We have sent in bactroban ointment to use daily on the wounds. Make sure to keep changing position and keep pressure off the area so it can heal.

## 2021-08-03 NOTE — Progress Notes (Signed)
° °  Subjective:   Patient ID: Mary Valencia, female    DOB: 08-17-1955, 66 y.o.   MRN: [id number]  HPI The patient is a 66 YO female coming in for wound check on sacrum. Is moving around some at home with walker. Sits a lot. Past stroke. Poorly controlled diabetes as of recent they are working on this.  Review of Systems  Constitutional: Negative.   HENT: Negative.    Eyes: Negative.   Respiratory:  Negative for cough, chest tightness and shortness of breath.   Cardiovascular:  Negative for chest pain, palpitations and leg swelling.  Gastrointestinal:  Negative for abdominal distention, abdominal pain, constipation, diarrhea, nausea and vomiting.  Musculoskeletal:  Positive for myalgias.  Skin: Negative.   Neurological:  Positive for weakness and numbness.  Psychiatric/Behavioral: Negative.     Objective:  Physical Exam Constitutional:      Appearance: She is well-developed.  HENT:     Head: Normocephalic and atraumatic.  Cardiovascular:     Rate and Rhythm: Normal rate and regular rhythm.  Pulmonary:     Effort: Pulmonary effort is normal. No respiratory distress.     Breath sounds: Normal breath sounds. No wheezing or rales.  Abdominal:     General: Bowel sounds are normal. There is no distension.     Palpations: Abdomen is soft.     Tenderness: There is no abdominal tenderness. There is no rebound.  Musculoskeletal:     Cervical back: Normal range of motion.  Skin:    General: Skin is warm and dry.     Comments: 2-3 stage 1 pressure ulcers on the buttocks and sacrum without signs of infection or discharge.   Neurological:     Mental Status: She is alert and oriented to person, place, and time.     Sensory: Sensory deficit present.     Coordination: Coordination abnormal.    Vitals:   08/03/21 1051  BP: 130/68  Pulse: 80  Resp: 18  SpO2: 95%  Weight: 134 lb 12.8 oz (61.1 kg)  Height: 5\' 2"  (1.575 m)    This visit occurred during the SARS-CoV-2 public health  emergency.  Safety protocols were in place, including screening questions prior to the visit, additional usage of staff PPE, and extensive cleaning of exam room while observing appropriate contact time as indicated for disinfecting solutions.   Assessment & Plan:

## 2021-08-04 DIAGNOSIS — D631 Anemia in chronic kidney disease: Secondary | ICD-10-CM | POA: Diagnosis not present

## 2021-08-04 DIAGNOSIS — N186 End stage renal disease: Secondary | ICD-10-CM | POA: Diagnosis not present

## 2021-08-04 DIAGNOSIS — N2581 Secondary hyperparathyroidism of renal origin: Secondary | ICD-10-CM | POA: Diagnosis not present

## 2021-08-05 DIAGNOSIS — D631 Anemia in chronic kidney disease: Secondary | ICD-10-CM | POA: Diagnosis not present

## 2021-08-05 DIAGNOSIS — N2581 Secondary hyperparathyroidism of renal origin: Secondary | ICD-10-CM | POA: Diagnosis not present

## 2021-08-05 DIAGNOSIS — N186 End stage renal disease: Secondary | ICD-10-CM | POA: Diagnosis not present

## 2021-08-06 DIAGNOSIS — N186 End stage renal disease: Secondary | ICD-10-CM | POA: Diagnosis not present

## 2021-08-06 DIAGNOSIS — L89151 Pressure ulcer of sacral region, stage 1: Secondary | ICD-10-CM | POA: Insufficient documentation

## 2021-08-06 DIAGNOSIS — N2581 Secondary hyperparathyroidism of renal origin: Secondary | ICD-10-CM | POA: Diagnosis not present

## 2021-08-06 DIAGNOSIS — D631 Anemia in chronic kidney disease: Secondary | ICD-10-CM | POA: Diagnosis not present

## 2021-08-06 NOTE — Assessment & Plan Note (Addendum)
Recently poorly controlled and they are working to improve this. She is not monitoring sugars currently but going to gt back with the continuous monitoring once this clears insurance. She is taking insulin nph with meals currently. Given dialysis it will be difficult to assess control as HgA1c may not be accurate. Will need information from CGM for best accuracy.

## 2021-08-06 NOTE — Assessment & Plan Note (Signed)
Counseled patient and caretaker on care of wounds as well as pressure reduction through different positions and pillows to help with positioning and frequent moving. Rx bactroban ointment to use topically to prevent infection. She is a poorly controlled diabetic and will need frequent monitoring. Given hh orders to add nursing.

## 2021-08-06 NOTE — Assessment & Plan Note (Signed)
This is limiting her movement and has caused stage 1 pressure sores on the buttocks and sacrum area.

## 2021-08-07 DIAGNOSIS — D631 Anemia in chronic kidney disease: Secondary | ICD-10-CM | POA: Diagnosis not present

## 2021-08-07 DIAGNOSIS — N186 End stage renal disease: Secondary | ICD-10-CM | POA: Diagnosis not present

## 2021-08-07 DIAGNOSIS — N2581 Secondary hyperparathyroidism of renal origin: Secondary | ICD-10-CM | POA: Diagnosis not present

## 2021-08-08 DIAGNOSIS — N186 End stage renal disease: Secondary | ICD-10-CM | POA: Diagnosis not present

## 2021-08-08 DIAGNOSIS — D631 Anemia in chronic kidney disease: Secondary | ICD-10-CM | POA: Diagnosis not present

## 2021-08-08 DIAGNOSIS — N2581 Secondary hyperparathyroidism of renal origin: Secondary | ICD-10-CM | POA: Diagnosis not present

## 2021-08-09 DIAGNOSIS — N186 End stage renal disease: Secondary | ICD-10-CM | POA: Diagnosis not present

## 2021-08-09 DIAGNOSIS — N2581 Secondary hyperparathyroidism of renal origin: Secondary | ICD-10-CM | POA: Diagnosis not present

## 2021-08-09 DIAGNOSIS — D631 Anemia in chronic kidney disease: Secondary | ICD-10-CM | POA: Diagnosis not present

## 2021-08-10 DIAGNOSIS — N186 End stage renal disease: Secondary | ICD-10-CM | POA: Diagnosis not present

## 2021-08-10 DIAGNOSIS — D631 Anemia in chronic kidney disease: Secondary | ICD-10-CM | POA: Diagnosis not present

## 2021-08-10 DIAGNOSIS — N2581 Secondary hyperparathyroidism of renal origin: Secondary | ICD-10-CM | POA: Diagnosis not present

## 2021-08-11 DIAGNOSIS — D631 Anemia in chronic kidney disease: Secondary | ICD-10-CM | POA: Diagnosis not present

## 2021-08-11 DIAGNOSIS — N2581 Secondary hyperparathyroidism of renal origin: Secondary | ICD-10-CM | POA: Diagnosis not present

## 2021-08-11 DIAGNOSIS — N186 End stage renal disease: Secondary | ICD-10-CM | POA: Diagnosis not present

## 2021-08-12 DIAGNOSIS — N186 End stage renal disease: Secondary | ICD-10-CM | POA: Diagnosis not present

## 2021-08-12 DIAGNOSIS — N2581 Secondary hyperparathyroidism of renal origin: Secondary | ICD-10-CM | POA: Diagnosis not present

## 2021-08-12 DIAGNOSIS — D631 Anemia in chronic kidney disease: Secondary | ICD-10-CM | POA: Diagnosis not present

## 2021-08-13 DIAGNOSIS — N2581 Secondary hyperparathyroidism of renal origin: Secondary | ICD-10-CM | POA: Diagnosis not present

## 2021-08-13 DIAGNOSIS — D631 Anemia in chronic kidney disease: Secondary | ICD-10-CM | POA: Diagnosis not present

## 2021-08-13 DIAGNOSIS — N186 End stage renal disease: Secondary | ICD-10-CM | POA: Diagnosis not present

## 2021-08-14 DIAGNOSIS — D631 Anemia in chronic kidney disease: Secondary | ICD-10-CM | POA: Diagnosis not present

## 2021-08-14 DIAGNOSIS — N2581 Secondary hyperparathyroidism of renal origin: Secondary | ICD-10-CM | POA: Diagnosis not present

## 2021-08-14 DIAGNOSIS — N186 End stage renal disease: Secondary | ICD-10-CM | POA: Diagnosis not present

## 2021-08-15 DIAGNOSIS — N186 End stage renal disease: Secondary | ICD-10-CM | POA: Diagnosis not present

## 2021-08-15 DIAGNOSIS — N2581 Secondary hyperparathyroidism of renal origin: Secondary | ICD-10-CM | POA: Diagnosis not present

## 2021-08-15 DIAGNOSIS — D631 Anemia in chronic kidney disease: Secondary | ICD-10-CM | POA: Diagnosis not present

## 2021-08-16 DIAGNOSIS — N2581 Secondary hyperparathyroidism of renal origin: Secondary | ICD-10-CM | POA: Diagnosis not present

## 2021-08-16 DIAGNOSIS — N186 End stage renal disease: Secondary | ICD-10-CM | POA: Diagnosis not present

## 2021-08-16 DIAGNOSIS — D631 Anemia in chronic kidney disease: Secondary | ICD-10-CM | POA: Diagnosis not present

## 2021-08-17 DIAGNOSIS — N186 End stage renal disease: Secondary | ICD-10-CM | POA: Diagnosis not present

## 2021-08-17 DIAGNOSIS — N2581 Secondary hyperparathyroidism of renal origin: Secondary | ICD-10-CM | POA: Diagnosis not present

## 2021-08-17 DIAGNOSIS — D631 Anemia in chronic kidney disease: Secondary | ICD-10-CM | POA: Diagnosis not present

## 2021-08-18 DIAGNOSIS — N2581 Secondary hyperparathyroidism of renal origin: Secondary | ICD-10-CM | POA: Diagnosis not present

## 2021-08-18 DIAGNOSIS — N186 End stage renal disease: Secondary | ICD-10-CM | POA: Diagnosis not present

## 2021-08-18 DIAGNOSIS — D631 Anemia in chronic kidney disease: Secondary | ICD-10-CM | POA: Diagnosis not present

## 2021-08-19 DIAGNOSIS — N186 End stage renal disease: Secondary | ICD-10-CM | POA: Diagnosis not present

## 2021-08-19 DIAGNOSIS — N2581 Secondary hyperparathyroidism of renal origin: Secondary | ICD-10-CM | POA: Diagnosis not present

## 2021-08-19 DIAGNOSIS — D631 Anemia in chronic kidney disease: Secondary | ICD-10-CM | POA: Diagnosis not present

## 2021-08-20 DIAGNOSIS — D631 Anemia in chronic kidney disease: Secondary | ICD-10-CM | POA: Diagnosis not present

## 2021-08-20 DIAGNOSIS — N2581 Secondary hyperparathyroidism of renal origin: Secondary | ICD-10-CM | POA: Diagnosis not present

## 2021-08-20 DIAGNOSIS — N186 End stage renal disease: Secondary | ICD-10-CM | POA: Diagnosis not present

## 2021-08-21 DIAGNOSIS — N186 End stage renal disease: Secondary | ICD-10-CM | POA: Diagnosis not present

## 2021-08-21 DIAGNOSIS — D631 Anemia in chronic kidney disease: Secondary | ICD-10-CM | POA: Diagnosis not present

## 2021-08-21 DIAGNOSIS — N2581 Secondary hyperparathyroidism of renal origin: Secondary | ICD-10-CM | POA: Diagnosis not present

## 2021-08-22 DIAGNOSIS — N186 End stage renal disease: Secondary | ICD-10-CM | POA: Diagnosis not present

## 2021-08-22 DIAGNOSIS — D631 Anemia in chronic kidney disease: Secondary | ICD-10-CM | POA: Diagnosis not present

## 2021-08-22 DIAGNOSIS — N2581 Secondary hyperparathyroidism of renal origin: Secondary | ICD-10-CM | POA: Diagnosis not present

## 2021-08-23 DIAGNOSIS — N186 End stage renal disease: Secondary | ICD-10-CM | POA: Diagnosis not present

## 2021-08-23 DIAGNOSIS — N2581 Secondary hyperparathyroidism of renal origin: Secondary | ICD-10-CM | POA: Diagnosis not present

## 2021-08-23 DIAGNOSIS — Z992 Dependence on renal dialysis: Secondary | ICD-10-CM | POA: Diagnosis not present

## 2021-08-23 DIAGNOSIS — D631 Anemia in chronic kidney disease: Secondary | ICD-10-CM | POA: Diagnosis not present

## 2021-08-24 ENCOUNTER — Telehealth: Payer: Self-pay | Admitting: Internal Medicine

## 2021-08-24 DIAGNOSIS — N2581 Secondary hyperparathyroidism of renal origin: Secondary | ICD-10-CM | POA: Diagnosis not present

## 2021-08-24 DIAGNOSIS — N186 End stage renal disease: Secondary | ICD-10-CM | POA: Diagnosis not present

## 2021-08-24 NOTE — Telephone Encounter (Signed)
Pt states her insurance is requesting a PA for Continuous Blood Gluc Receiver (FREESTYLE LIBRE 14 DAY READER ? ? ?

## 2021-08-25 DIAGNOSIS — N2581 Secondary hyperparathyroidism of renal origin: Secondary | ICD-10-CM | POA: Diagnosis not present

## 2021-08-25 DIAGNOSIS — N186 End stage renal disease: Secondary | ICD-10-CM | POA: Diagnosis not present

## 2021-08-25 NOTE — Telephone Encounter (Signed)
PA has been started. ? ?(Key: [id number]) ?

## 2021-08-26 DIAGNOSIS — N2581 Secondary hyperparathyroidism of renal origin: Secondary | ICD-10-CM | POA: Diagnosis not present

## 2021-08-26 DIAGNOSIS — N186 End stage renal disease: Secondary | ICD-10-CM | POA: Diagnosis not present

## 2021-08-27 DIAGNOSIS — N2581 Secondary hyperparathyroidism of renal origin: Secondary | ICD-10-CM | POA: Diagnosis not present

## 2021-08-27 DIAGNOSIS — N186 End stage renal disease: Secondary | ICD-10-CM | POA: Diagnosis not present

## 2021-08-28 DIAGNOSIS — N186 End stage renal disease: Secondary | ICD-10-CM | POA: Diagnosis not present

## 2021-08-28 DIAGNOSIS — N2581 Secondary hyperparathyroidism of renal origin: Secondary | ICD-10-CM | POA: Diagnosis not present

## 2021-08-29 DIAGNOSIS — N186 End stage renal disease: Secondary | ICD-10-CM | POA: Diagnosis not present

## 2021-08-29 DIAGNOSIS — N2581 Secondary hyperparathyroidism of renal origin: Secondary | ICD-10-CM | POA: Diagnosis not present

## 2021-08-30 DIAGNOSIS — N186 End stage renal disease: Secondary | ICD-10-CM | POA: Diagnosis not present

## 2021-08-30 DIAGNOSIS — N2581 Secondary hyperparathyroidism of renal origin: Secondary | ICD-10-CM | POA: Diagnosis not present

## 2021-08-31 DIAGNOSIS — N186 End stage renal disease: Secondary | ICD-10-CM | POA: Diagnosis not present

## 2021-08-31 DIAGNOSIS — N2581 Secondary hyperparathyroidism of renal origin: Secondary | ICD-10-CM | POA: Diagnosis not present

## 2021-09-01 DIAGNOSIS — N186 End stage renal disease: Secondary | ICD-10-CM | POA: Diagnosis not present

## 2021-09-01 DIAGNOSIS — N2581 Secondary hyperparathyroidism of renal origin: Secondary | ICD-10-CM | POA: Diagnosis not present

## 2021-09-02 DIAGNOSIS — N2581 Secondary hyperparathyroidism of renal origin: Secondary | ICD-10-CM | POA: Diagnosis not present

## 2021-09-02 DIAGNOSIS — N186 End stage renal disease: Secondary | ICD-10-CM | POA: Diagnosis not present

## 2021-09-03 DIAGNOSIS — N186 End stage renal disease: Secondary | ICD-10-CM | POA: Diagnosis not present

## 2021-09-03 DIAGNOSIS — N2581 Secondary hyperparathyroidism of renal origin: Secondary | ICD-10-CM | POA: Diagnosis not present

## 2021-09-04 DIAGNOSIS — N186 End stage renal disease: Secondary | ICD-10-CM | POA: Diagnosis not present

## 2021-09-04 DIAGNOSIS — N2581 Secondary hyperparathyroidism of renal origin: Secondary | ICD-10-CM | POA: Diagnosis not present

## 2021-09-05 DIAGNOSIS — N186 End stage renal disease: Secondary | ICD-10-CM | POA: Diagnosis not present

## 2021-09-05 DIAGNOSIS — N2581 Secondary hyperparathyroidism of renal origin: Secondary | ICD-10-CM | POA: Diagnosis not present

## 2021-09-06 DIAGNOSIS — N186 End stage renal disease: Secondary | ICD-10-CM | POA: Diagnosis not present

## 2021-09-06 DIAGNOSIS — N2581 Secondary hyperparathyroidism of renal origin: Secondary | ICD-10-CM | POA: Diagnosis not present

## 2021-09-07 DIAGNOSIS — N186 End stage renal disease: Secondary | ICD-10-CM | POA: Diagnosis not present

## 2021-09-07 DIAGNOSIS — N2581 Secondary hyperparathyroidism of renal origin: Secondary | ICD-10-CM | POA: Diagnosis not present

## 2021-09-08 DIAGNOSIS — N186 End stage renal disease: Secondary | ICD-10-CM | POA: Diagnosis not present

## 2021-09-08 DIAGNOSIS — N2581 Secondary hyperparathyroidism of renal origin: Secondary | ICD-10-CM | POA: Diagnosis not present

## 2021-09-09 DIAGNOSIS — N186 End stage renal disease: Secondary | ICD-10-CM | POA: Diagnosis not present

## 2021-09-09 DIAGNOSIS — N2581 Secondary hyperparathyroidism of renal origin: Secondary | ICD-10-CM | POA: Diagnosis not present

## 2021-09-10 DIAGNOSIS — N186 End stage renal disease: Secondary | ICD-10-CM | POA: Diagnosis not present

## 2021-09-10 DIAGNOSIS — N2581 Secondary hyperparathyroidism of renal origin: Secondary | ICD-10-CM | POA: Diagnosis not present

## 2021-09-11 DIAGNOSIS — N186 End stage renal disease: Secondary | ICD-10-CM | POA: Diagnosis not present

## 2021-09-11 DIAGNOSIS — N2581 Secondary hyperparathyroidism of renal origin: Secondary | ICD-10-CM | POA: Diagnosis not present

## 2021-09-12 ENCOUNTER — Telehealth: Payer: Self-pay | Admitting: Internal Medicine

## 2021-09-12 DIAGNOSIS — N2581 Secondary hyperparathyroidism of renal origin: Secondary | ICD-10-CM | POA: Diagnosis not present

## 2021-09-12 DIAGNOSIS — N186 End stage renal disease: Secondary | ICD-10-CM | POA: Diagnosis not present

## 2021-09-12 NOTE — Telephone Encounter (Signed)
Rep w/ active styles states ncdma form was received but portion 19 and 20 need to be completed, dated, and initialed by provider ? ?Phone [phone] ? ?Fax [phone] ? ? ?

## 2021-09-13 DIAGNOSIS — N2581 Secondary hyperparathyroidism of renal origin: Secondary | ICD-10-CM | POA: Diagnosis not present

## 2021-09-13 DIAGNOSIS — N186 End stage renal disease: Secondary | ICD-10-CM | POA: Diagnosis not present

## 2021-09-14 DIAGNOSIS — N186 End stage renal disease: Secondary | ICD-10-CM | POA: Diagnosis not present

## 2021-09-14 DIAGNOSIS — N2581 Secondary hyperparathyroidism of renal origin: Secondary | ICD-10-CM | POA: Diagnosis not present

## 2021-09-15 DIAGNOSIS — N186 End stage renal disease: Secondary | ICD-10-CM | POA: Diagnosis not present

## 2021-09-15 DIAGNOSIS — N2581 Secondary hyperparathyroidism of renal origin: Secondary | ICD-10-CM | POA: Diagnosis not present

## 2021-09-16 DIAGNOSIS — N186 End stage renal disease: Secondary | ICD-10-CM | POA: Diagnosis not present

## 2021-09-16 DIAGNOSIS — N2581 Secondary hyperparathyroidism of renal origin: Secondary | ICD-10-CM | POA: Diagnosis not present

## 2021-09-17 DIAGNOSIS — N186 End stage renal disease: Secondary | ICD-10-CM | POA: Diagnosis not present

## 2021-09-17 DIAGNOSIS — N2581 Secondary hyperparathyroidism of renal origin: Secondary | ICD-10-CM | POA: Diagnosis not present

## 2021-09-18 DIAGNOSIS — N186 End stage renal disease: Secondary | ICD-10-CM | POA: Diagnosis not present

## 2021-09-18 DIAGNOSIS — N2581 Secondary hyperparathyroidism of renal origin: Secondary | ICD-10-CM | POA: Diagnosis not present

## 2021-09-19 DIAGNOSIS — N186 End stage renal disease: Secondary | ICD-10-CM | POA: Diagnosis not present

## 2021-09-19 DIAGNOSIS — N2581 Secondary hyperparathyroidism of renal origin: Secondary | ICD-10-CM | POA: Diagnosis not present

## 2021-09-19 NOTE — Telephone Encounter (Signed)
Rep w/ active styles checking status of resubmission of fax w/ information below ? ?Please advise ?

## 2021-09-19 NOTE — Telephone Encounter (Signed)
Paper work has been updated and refaxed. ?

## 2021-09-20 ENCOUNTER — Encounter: Payer: Self-pay | Admitting: Internal Medicine

## 2021-09-20 ENCOUNTER — Ambulatory Visit (INDEPENDENT_AMBULATORY_CARE_PROVIDER_SITE_OTHER): Payer: Medicare Other | Admitting: Internal Medicine

## 2021-09-20 DIAGNOSIS — N2581 Secondary hyperparathyroidism of renal origin: Secondary | ICD-10-CM | POA: Diagnosis not present

## 2021-09-20 DIAGNOSIS — Z8673 Personal history of transient ischemic attack (TIA), and cerebral infarction without residual deficits: Secondary | ICD-10-CM

## 2021-09-20 DIAGNOSIS — E118 Type 2 diabetes mellitus with unspecified complications: Secondary | ICD-10-CM | POA: Diagnosis not present

## 2021-09-20 DIAGNOSIS — Z992 Dependence on renal dialysis: Secondary | ICD-10-CM | POA: Diagnosis not present

## 2021-09-20 DIAGNOSIS — N186 End stage renal disease: Secondary | ICD-10-CM | POA: Diagnosis not present

## 2021-09-20 DIAGNOSIS — Z7409 Other reduced mobility: Secondary | ICD-10-CM | POA: Insufficient documentation

## 2021-09-20 DIAGNOSIS — I10 Essential (primary) hypertension: Secondary | ICD-10-CM

## 2021-09-20 NOTE — Progress Notes (Signed)
? ?Subjective:  ?Patient ID: Mary Valencia, female    DOB: Mar 17, 1956  Age: 66 y.o. MRN: [id number] ? ?CC: No chief complaint on file. ? ? ?HPI ?Mary Valencia presents for CRF, HTN, DM ?Needs assistance at home ?She is here w/her sister Stanton Kidney ? ?Outpatient Medications Prior to Visit  ?Medication Sig Dispense Refill  ? Alcohol Swabs (B-D SINGLE USE SWABS REGULAR) PADS Use as directed to clean site to check blood sugars 180 each 3  ? calcitRIOL (ROCALTROL) 0.5 MCG capsule Take by mouth. TAKE 1 BY MOUTH DAILY    ? Continuous Blood Gluc Receiver (FREESTYLE LIBRE 14 DAY READER) DEVI 1 Units by Does not apply route daily. 1 each 1  ? Continuous Blood Gluc Sensor (FREESTYLE LIBRE 14 DAY SENSOR) MISC 1 Units by Does not apply route every 14 (fourteen) days. 6 each 3  ? diphenhydrAMINE (BENADRYL) 25 mg capsule Take 25 mg by mouth as needed.    ? glucose blood test strip TEST BLOOD GLUCOSE THREE TIMES DAILY 300 each 3  ? hydrALAZINE (APRESOLINE) 50 MG tablet TAKE 1 BY  MOUTH TWICE A DAY    ? insulin NPH Human (NOVOLIN N) 100 UNIT/ML injection Inject into the skin. Inject 8 Units into the skin 2 times daily with meals    ? losartan (COZAAR) 50 MG tablet Take 1 tablet (50 mg total) by mouth daily. (Patient taking differently: Take 50 mg by mouth in the morning and at bedtime.) 90 tablet 3  ? mupirocin cream (BACTROBAN) 2 % Apply 1 application topically daily. 100 g 3  ? NIFEdipine (PROCARDIA XL/NIFEDICAL-XL) 90 MG 24 hr tablet     ? omeprazole (PRILOSEC) 40 MG capsule TAKE 1 CAPSULE EVERY DAY 90 capsule 3  ? polyethylene glycol (MIRALAX / GLYCOLAX) packet Take 17 g by mouth.    ? PRESCRIPTION MEDICATION 210 mg 3 (three) times daily before meals.    ? senna-docusate (SENOKOT-S) 8.6-50 MG tablet Take by mouth 2 (two) times daily. Take 2 tab twice daily.    ? VELPHORO 500 MG chewable tablet CHEW 1 THREE TIMES A DAY WITH MEALS    ? hydrALAZINE (APRESOLINE) 100 MG tablet Take 1 tablet (100 mg total) by mouth 3 (three) times  daily. 270 tablet 3  ? labetalol (NORMODYNE) 200 MG tablet Take 1 tablet (200 mg total) by mouth 3 (three) times daily. 90 tablet 3  ? NIFEdipine (ADALAT CC) 90 MG 24 hr tablet TAKE 1 BY MOUTH DAILY    ? atorvastatin (LIPITOR) 40 MG tablet TAKE 1 TABLET EVERY DAY (FOLLOW UP APPOINTMENT DUE IN FEBRUARY WITH LIPID CHECK MUST SEE PROVIDER FOR FUTURE REFILLS) (Patient not taking: Reported on 07/11/2021) 90 tablet 0  ? ?No facility-administered medications prior to visit.  ? ? ?ROS: ?Review of Systems  ?Constitutional:  Positive for fatigue. Negative for activity change, appetite change, chills and unexpected weight change.  ?HENT:  Negative for congestion, mouth sores and sinus pressure.   ?Eyes:  Negative for visual disturbance.  ?Respiratory:  Negative for cough and chest tightness.   ?Gastrointestinal:  Negative for abdominal pain and nausea.  ?Genitourinary:  Negative for difficulty urinating, frequency and vaginal pain.  ?Musculoskeletal:  Positive for arthralgias and gait problem. Negative for back pain.  ?Skin:  Negative for pallor and rash.  ?Neurological:  Positive for dizziness and weakness. Negative for tremors, numbness and headaches.  ?Psychiatric/Behavioral:  Negative for confusion, sleep disturbance and suicidal ideas. The patient is not nervous/anxious.   ? ?Objective:  ?  BP 100/68 (BP Location: Left Arm, Patient Position: Sitting, Cuff Size: Large)   Pulse 68   Temp 97.9 ?F (36.6 ?C) (Oral)   Ht '5\' 2"'$  (1.575 m)   Wt 138 lb (62.6 kg)   SpO2 96%   BMI 25.24 kg/m?  ? ?BP Readings from Last 3 Encounters:  ?09/20/21 100/68  ?08/03/21 130/68  ?06/16/21 132/84  ? ? ?Wt Readings from Last 3 Encounters:  ?09/20/21 138 lb (62.6 kg)  ?08/03/21 134 lb 12.8 oz (61.1 kg)  ?06/16/21 138 lb 9.6 oz (62.9 kg)  ? ? ?Physical Exam ?Constitutional:   ?   General: She is not in acute distress. ?   Appearance: She is well-developed. She is obese.  ?HENT:  ?   Head: Normocephalic.  ?   Right Ear: External ear normal.  ?    Left Ear: External ear normal.  ?   Nose: Nose normal.  ?Eyes:  ?   General:     ?   Right eye: No discharge.     ?   Left eye: No discharge.  ?   Conjunctiva/sclera: Conjunctivae normal.  ?   Pupils: Pupils are equal, round, and reactive to light.  ?Neck:  ?   Thyroid: No thyromegaly.  ?   Vascular: No JVD.  ?   Trachea: No tracheal deviation.  ?Cardiovascular:  ?   Rate and Rhythm: Normal rate and regular rhythm.  ?   Heart sounds: Normal heart sounds.  ?Pulmonary:  ?   Effort: No respiratory distress.  ?   Breath sounds: No stridor. No wheezing.  ?Abdominal:  ?   General: Bowel sounds are normal. There is no distension.  ?   Palpations: Abdomen is soft. There is no mass.  ?   Tenderness: There is no abdominal tenderness. There is no guarding or rebound.  ?Musculoskeletal:     ?   General: No tenderness.  ?   Cervical back: Normal range of motion and neck supple. No rigidity.  ?Lymphadenopathy:  ?   Cervical: No cervical adenopathy.  ?Skin: ?   Findings: No erythema or rash.  ?Neurological:  ?   Cranial Nerves: No cranial nerve deficit.  ?   Motor: Weakness present. No abnormal muscle tone.  ?   Coordination: Coordination normal.  ?   Gait: Gait abnormal.  ?   Deep Tendon Reflexes: Reflexes normal.  ?Psychiatric:     ?   Behavior: Behavior normal.     ?   Thought Content: Thought content normal.     ?   Judgment: Judgment normal.  ?In  w/c ? ?PCS form was filled out ? ?Lab Results  ?Component Value Date  ? WBC 7.7 08/29/2017  ? HGB 29.1 09/01/2017  ? HCT 30 (A) 08/29/2017  ? PLT 203 08/29/2017  ? GLUCOSE 279 (H) 09/21/2017  ? CHOL 161 09/21/2017  ? TRIG 72.0 09/21/2017  ? HDL 63.60 09/21/2017  ? LDLDIRECT 207.8 09/29/2010  ? [place] 83 09/21/2017  ? ALT 6 09/21/2017  ? AST 21 09/21/2017  ? NA 141 09/21/2017  ? K 4.9 09/21/2017  ? CL 98 09/21/2017  ? CREATININE 13.49 (HH) 09/21/2017  ? BUN 66 (H) 09/21/2017  ? CO2 27 09/21/2017  ? TSH 2.81 09/21/2017  ? HGBA1C 12.6 (A) 06/16/2021  ? HGBA1C 12.6 06/16/2021  ? HGBA1C  12.6 (A) 06/16/2021  ? HGBA1C 12.6 (A) 06/16/2021  ? ? ?No results found. ? ?Assessment & Plan:  ? ?Problem List Items Addressed This  Visit   ? ? Diabetes mellitus type 2 with complications ([place])  ?  Cont on Novolyn ?Checking labs w/Nephrology office ?  ?  ? ESRD (end stage renal disease) on dialysis [facility])  ?  Continue with dialysis ?  ?  ? History of cardioembolic cerebrovascular accident (CVA)  ?  PCS form was filled out to be faxed tomorrow ?Continue with Lipitor, baby aspirin, blood pressure meds ?  ?  ? Essential hypertension  ?  Recurrent dialysis.  Continue to monitor.  ?  ?  ? Relevant Medications  ? NIFEdipine (PROCARDIA XL/NIFEDICAL-XL) 90 MG 24 hr tablet  ?  ? ? ?No orders of the defined types were placed in this encounter. ?  ? ? ?Follow-up: Return in about 4 months (around 01/20/2022) for a follow-up visit. ? ?Walker Kehr, MD ?

## 2021-09-20 NOTE — Assessment & Plan Note (Signed)
Cont on Novolyn ?Checking labs w/Nephrology office ?

## 2021-09-21 DIAGNOSIS — N186 End stage renal disease: Secondary | ICD-10-CM | POA: Diagnosis not present

## 2021-09-21 DIAGNOSIS — N2581 Secondary hyperparathyroidism of renal origin: Secondary | ICD-10-CM | POA: Diagnosis not present

## 2021-09-22 DIAGNOSIS — N2581 Secondary hyperparathyroidism of renal origin: Secondary | ICD-10-CM | POA: Diagnosis not present

## 2021-09-22 DIAGNOSIS — N186 End stage renal disease: Secondary | ICD-10-CM | POA: Diagnosis not present

## 2021-09-23 ENCOUNTER — Telehealth: Payer: Self-pay | Admitting: Internal Medicine

## 2021-09-23 DIAGNOSIS — N186 End stage renal disease: Secondary | ICD-10-CM | POA: Diagnosis not present

## 2021-09-23 DIAGNOSIS — N2581 Secondary hyperparathyroidism of renal origin: Secondary | ICD-10-CM | POA: Diagnosis not present

## 2021-09-23 DIAGNOSIS — Z992 Dependence on renal dialysis: Secondary | ICD-10-CM | POA: Diagnosis not present

## 2021-09-23 NOTE — Telephone Encounter (Signed)
Pt requesting an order for a home health aide ? ?Pt states her insurance states she qualifies for 28 hrs of aide per week ?

## 2021-09-24 DIAGNOSIS — D631 Anemia in chronic kidney disease: Secondary | ICD-10-CM | POA: Diagnosis not present

## 2021-09-24 DIAGNOSIS — N186 End stage renal disease: Secondary | ICD-10-CM | POA: Diagnosis not present

## 2021-09-24 DIAGNOSIS — N2581 Secondary hyperparathyroidism of renal origin: Secondary | ICD-10-CM | POA: Diagnosis not present

## 2021-09-25 DIAGNOSIS — D631 Anemia in chronic kidney disease: Secondary | ICD-10-CM | POA: Diagnosis not present

## 2021-09-25 DIAGNOSIS — N2581 Secondary hyperparathyroidism of renal origin: Secondary | ICD-10-CM | POA: Diagnosis not present

## 2021-09-25 DIAGNOSIS — N186 End stage renal disease: Secondary | ICD-10-CM | POA: Diagnosis not present

## 2021-09-25 NOTE — Telephone Encounter (Signed)
Mary Valencia, ?The form was filled out on the same day of her last visit.  You probably faxed it to [facility].  Thanks ?

## 2021-09-25 NOTE — Assessment & Plan Note (Signed)
PCS form was filled out to be faxed tomorrow ?Continue with Lipitor, baby aspirin, blood pressure meds ?

## 2021-09-25 NOTE — Assessment & Plan Note (Signed)
Recurrent dialysis.  Continue to monitor.  ?

## 2021-09-25 NOTE — Assessment & Plan Note (Signed)
Continue with dialysis ?

## 2021-09-26 DIAGNOSIS — N186 End stage renal disease: Secondary | ICD-10-CM | POA: Diagnosis not present

## 2021-09-26 DIAGNOSIS — D631 Anemia in chronic kidney disease: Secondary | ICD-10-CM | POA: Diagnosis not present

## 2021-09-26 DIAGNOSIS — N2581 Secondary hyperparathyroidism of renal origin: Secondary | ICD-10-CM | POA: Diagnosis not present

## 2021-09-26 NOTE — Telephone Encounter (Signed)
Pt checking status of hh request ? ?Pt states her personal navigator at [facility] name is Raven ? ?Phone [phone] ext [phone] ?

## 2021-09-27 DIAGNOSIS — N186 End stage renal disease: Secondary | ICD-10-CM | POA: Diagnosis not present

## 2021-09-27 DIAGNOSIS — N2581 Secondary hyperparathyroidism of renal origin: Secondary | ICD-10-CM | POA: Diagnosis not present

## 2021-09-27 DIAGNOSIS — D631 Anemia in chronic kidney disease: Secondary | ICD-10-CM | POA: Diagnosis not present

## 2021-09-28 DIAGNOSIS — N186 End stage renal disease: Secondary | ICD-10-CM | POA: Diagnosis not present

## 2021-09-28 DIAGNOSIS — N2581 Secondary hyperparathyroidism of renal origin: Secondary | ICD-10-CM | POA: Diagnosis not present

## 2021-09-28 DIAGNOSIS — D631 Anemia in chronic kidney disease: Secondary | ICD-10-CM | POA: Diagnosis not present

## 2021-09-28 NOTE — Telephone Encounter (Signed)
Orders have been faxed over to the agency. ?

## 2021-09-29 DIAGNOSIS — E119 Type 2 diabetes mellitus without complications: Secondary | ICD-10-CM | POA: Diagnosis not present

## 2021-09-29 DIAGNOSIS — D631 Anemia in chronic kidney disease: Secondary | ICD-10-CM | POA: Diagnosis not present

## 2021-09-29 DIAGNOSIS — N2581 Secondary hyperparathyroidism of renal origin: Secondary | ICD-10-CM | POA: Diagnosis not present

## 2021-09-29 DIAGNOSIS — N186 End stage renal disease: Secondary | ICD-10-CM | POA: Diagnosis not present

## 2021-09-30 DIAGNOSIS — N186 End stage renal disease: Secondary | ICD-10-CM | POA: Diagnosis not present

## 2021-09-30 DIAGNOSIS — D631 Anemia in chronic kidney disease: Secondary | ICD-10-CM | POA: Diagnosis not present

## 2021-09-30 DIAGNOSIS — N2581 Secondary hyperparathyroidism of renal origin: Secondary | ICD-10-CM | POA: Diagnosis not present

## 2021-10-01 DIAGNOSIS — D631 Anemia in chronic kidney disease: Secondary | ICD-10-CM | POA: Diagnosis not present

## 2021-10-01 DIAGNOSIS — N186 End stage renal disease: Secondary | ICD-10-CM | POA: Diagnosis not present

## 2021-10-01 DIAGNOSIS — N2581 Secondary hyperparathyroidism of renal origin: Secondary | ICD-10-CM | POA: Diagnosis not present

## 2021-10-02 DIAGNOSIS — N2581 Secondary hyperparathyroidism of renal origin: Secondary | ICD-10-CM | POA: Diagnosis not present

## 2021-10-02 DIAGNOSIS — N186 End stage renal disease: Secondary | ICD-10-CM | POA: Diagnosis not present

## 2021-10-02 DIAGNOSIS — D631 Anemia in chronic kidney disease: Secondary | ICD-10-CM | POA: Diagnosis not present

## 2021-10-03 DIAGNOSIS — D631 Anemia in chronic kidney disease: Secondary | ICD-10-CM | POA: Diagnosis not present

## 2021-10-03 DIAGNOSIS — N2581 Secondary hyperparathyroidism of renal origin: Secondary | ICD-10-CM | POA: Diagnosis not present

## 2021-10-03 DIAGNOSIS — N186 End stage renal disease: Secondary | ICD-10-CM | POA: Diagnosis not present

## 2021-10-04 DIAGNOSIS — N186 End stage renal disease: Secondary | ICD-10-CM | POA: Diagnosis not present

## 2021-10-04 DIAGNOSIS — N2581 Secondary hyperparathyroidism of renal origin: Secondary | ICD-10-CM | POA: Diagnosis not present

## 2021-10-04 DIAGNOSIS — D631 Anemia in chronic kidney disease: Secondary | ICD-10-CM | POA: Diagnosis not present

## 2021-10-05 DIAGNOSIS — N186 End stage renal disease: Secondary | ICD-10-CM | POA: Diagnosis not present

## 2021-10-05 DIAGNOSIS — D631 Anemia in chronic kidney disease: Secondary | ICD-10-CM | POA: Diagnosis not present

## 2021-10-05 DIAGNOSIS — N2581 Secondary hyperparathyroidism of renal origin: Secondary | ICD-10-CM | POA: Diagnosis not present

## 2021-10-06 DIAGNOSIS — D631 Anemia in chronic kidney disease: Secondary | ICD-10-CM | POA: Diagnosis not present

## 2021-10-06 DIAGNOSIS — N2581 Secondary hyperparathyroidism of renal origin: Secondary | ICD-10-CM | POA: Diagnosis not present

## 2021-10-06 DIAGNOSIS — N186 End stage renal disease: Secondary | ICD-10-CM | POA: Diagnosis not present

## 2021-10-07 DIAGNOSIS — N2581 Secondary hyperparathyroidism of renal origin: Secondary | ICD-10-CM | POA: Diagnosis not present

## 2021-10-07 DIAGNOSIS — N186 End stage renal disease: Secondary | ICD-10-CM | POA: Diagnosis not present

## 2021-10-07 DIAGNOSIS — D631 Anemia in chronic kidney disease: Secondary | ICD-10-CM | POA: Diagnosis not present

## 2021-10-08 DIAGNOSIS — D631 Anemia in chronic kidney disease: Secondary | ICD-10-CM | POA: Diagnosis not present

## 2021-10-08 DIAGNOSIS — N186 End stage renal disease: Secondary | ICD-10-CM | POA: Diagnosis not present

## 2021-10-08 DIAGNOSIS — N2581 Secondary hyperparathyroidism of renal origin: Secondary | ICD-10-CM | POA: Diagnosis not present

## 2021-10-09 DIAGNOSIS — N186 End stage renal disease: Secondary | ICD-10-CM | POA: Diagnosis not present

## 2021-10-09 DIAGNOSIS — N2581 Secondary hyperparathyroidism of renal origin: Secondary | ICD-10-CM | POA: Diagnosis not present

## 2021-10-09 DIAGNOSIS — D631 Anemia in chronic kidney disease: Secondary | ICD-10-CM | POA: Diagnosis not present

## 2021-10-10 DIAGNOSIS — N2581 Secondary hyperparathyroidism of renal origin: Secondary | ICD-10-CM | POA: Diagnosis not present

## 2021-10-10 DIAGNOSIS — N186 End stage renal disease: Secondary | ICD-10-CM | POA: Diagnosis not present

## 2021-10-10 DIAGNOSIS — D631 Anemia in chronic kidney disease: Secondary | ICD-10-CM | POA: Diagnosis not present

## 2021-10-11 DIAGNOSIS — D631 Anemia in chronic kidney disease: Secondary | ICD-10-CM | POA: Diagnosis not present

## 2021-10-11 DIAGNOSIS — N2581 Secondary hyperparathyroidism of renal origin: Secondary | ICD-10-CM | POA: Diagnosis not present

## 2021-10-11 DIAGNOSIS — N186 End stage renal disease: Secondary | ICD-10-CM | POA: Diagnosis not present

## 2021-10-11 NOTE — Telephone Encounter (Signed)
[employer] and pt called to report she needs a PA complete for [facility]. This was the number provided [phone]. ? ?Please advise ?

## 2021-10-12 DIAGNOSIS — N186 End stage renal disease: Secondary | ICD-10-CM | POA: Diagnosis not present

## 2021-10-12 DIAGNOSIS — N2581 Secondary hyperparathyroidism of renal origin: Secondary | ICD-10-CM | POA: Diagnosis not present

## 2021-10-12 DIAGNOSIS — D631 Anemia in chronic kidney disease: Secondary | ICD-10-CM | POA: Diagnosis not present

## 2021-10-13 ENCOUNTER — Telehealth: Payer: Self-pay | Admitting: *Deleted

## 2021-10-13 ENCOUNTER — Encounter: Payer: Self-pay | Admitting: Licensed Clinical Social Worker

## 2021-10-13 DIAGNOSIS — D631 Anemia in chronic kidney disease: Secondary | ICD-10-CM | POA: Diagnosis not present

## 2021-10-13 DIAGNOSIS — N186 End stage renal disease: Secondary | ICD-10-CM | POA: Diagnosis not present

## 2021-10-13 DIAGNOSIS — N2581 Secondary hyperparathyroidism of renal origin: Secondary | ICD-10-CM | POA: Diagnosis not present

## 2021-10-13 NOTE — Progress Notes (Signed)
A user error has taken place: encounter opened in error, closed for administrative reasons.

## 2021-10-13 NOTE — Chronic Care Management (AMB) (Signed)
?  Care Management  ? ?Note ? ?10/13/2021 ?Name: Mary Valencia MRN: [id number] DOB: [birth year] ? ?[name] is a 66 y.o. year old female who is a primary care patient of Plotnikov, Evie Lacks, MD. I reached out to MARILYN NIHISER by phone today offer care coordination services.  ? ?[name] was given information about care management services today including:  ?Care management services include personalized support from designated clinical staff supervised by her physician, including individualized plan of care and coordination with other care providers ?24/7 contact phone numbers for assistance for urgent and routine care needs. ?The patient may stop care management services at any time by phone call to the office staff. ? ?Patient agreed to services and verbal consent obtained.  ? ?Follow up plan: ?Telephone appointment with care management team member scheduled for:10/25/21 ? ?Laverda Sorenson  ?Care Guide, Embedded Care Coordination ?[place]  Care Management  ?Direct Dial: [phone] ? ?

## 2021-10-14 DIAGNOSIS — D631 Anemia in chronic kidney disease: Secondary | ICD-10-CM | POA: Diagnosis not present

## 2021-10-14 DIAGNOSIS — N186 End stage renal disease: Secondary | ICD-10-CM | POA: Diagnosis not present

## 2021-10-14 DIAGNOSIS — N2581 Secondary hyperparathyroidism of renal origin: Secondary | ICD-10-CM | POA: Diagnosis not present

## 2021-10-14 NOTE — Telephone Encounter (Signed)
New note not needed °

## 2021-10-14 NOTE — Telephone Encounter (Signed)
I have spoke with pts insurance to inform them that we do not do PA for [facility] They need to contact pt and pt has to contact [place] agency to go forth with anything pertaining to [place]. ?

## 2021-10-15 DIAGNOSIS — N2581 Secondary hyperparathyroidism of renal origin: Secondary | ICD-10-CM | POA: Diagnosis not present

## 2021-10-15 DIAGNOSIS — D631 Anemia in chronic kidney disease: Secondary | ICD-10-CM | POA: Diagnosis not present

## 2021-10-15 DIAGNOSIS — N186 End stage renal disease: Secondary | ICD-10-CM | POA: Diagnosis not present

## 2021-10-16 DIAGNOSIS — N186 End stage renal disease: Secondary | ICD-10-CM | POA: Diagnosis not present

## 2021-10-16 DIAGNOSIS — D631 Anemia in chronic kidney disease: Secondary | ICD-10-CM | POA: Diagnosis not present

## 2021-10-16 DIAGNOSIS — N2581 Secondary hyperparathyroidism of renal origin: Secondary | ICD-10-CM | POA: Diagnosis not present

## 2021-10-17 DIAGNOSIS — N2581 Secondary hyperparathyroidism of renal origin: Secondary | ICD-10-CM | POA: Diagnosis not present

## 2021-10-17 DIAGNOSIS — D631 Anemia in chronic kidney disease: Secondary | ICD-10-CM | POA: Diagnosis not present

## 2021-10-17 DIAGNOSIS — N186 End stage renal disease: Secondary | ICD-10-CM | POA: Diagnosis not present

## 2021-10-18 DIAGNOSIS — N186 End stage renal disease: Secondary | ICD-10-CM | POA: Diagnosis not present

## 2021-10-18 DIAGNOSIS — N2581 Secondary hyperparathyroidism of renal origin: Secondary | ICD-10-CM | POA: Diagnosis not present

## 2021-10-18 DIAGNOSIS — D631 Anemia in chronic kidney disease: Secondary | ICD-10-CM | POA: Diagnosis not present

## 2021-10-19 DIAGNOSIS — N186 End stage renal disease: Secondary | ICD-10-CM | POA: Diagnosis not present

## 2021-10-19 DIAGNOSIS — D631 Anemia in chronic kidney disease: Secondary | ICD-10-CM | POA: Diagnosis not present

## 2021-10-19 DIAGNOSIS — N2581 Secondary hyperparathyroidism of renal origin: Secondary | ICD-10-CM | POA: Diagnosis not present

## 2021-10-20 DIAGNOSIS — D631 Anemia in chronic kidney disease: Secondary | ICD-10-CM | POA: Diagnosis not present

## 2021-10-20 DIAGNOSIS — N186 End stage renal disease: Secondary | ICD-10-CM | POA: Diagnosis not present

## 2021-10-20 DIAGNOSIS — N2581 Secondary hyperparathyroidism of renal origin: Secondary | ICD-10-CM | POA: Diagnosis not present

## 2021-10-21 DIAGNOSIS — D631 Anemia in chronic kidney disease: Secondary | ICD-10-CM | POA: Diagnosis not present

## 2021-10-21 DIAGNOSIS — N2581 Secondary hyperparathyroidism of renal origin: Secondary | ICD-10-CM | POA: Diagnosis not present

## 2021-10-21 DIAGNOSIS — N186 End stage renal disease: Secondary | ICD-10-CM | POA: Diagnosis not present

## 2021-10-22 DIAGNOSIS — D631 Anemia in chronic kidney disease: Secondary | ICD-10-CM | POA: Diagnosis not present

## 2021-10-22 DIAGNOSIS — N186 End stage renal disease: Secondary | ICD-10-CM | POA: Diagnosis not present

## 2021-10-22 DIAGNOSIS — N2581 Secondary hyperparathyroidism of renal origin: Secondary | ICD-10-CM | POA: Diagnosis not present

## 2021-10-23 DIAGNOSIS — D631 Anemia in chronic kidney disease: Secondary | ICD-10-CM | POA: Diagnosis not present

## 2021-10-23 DIAGNOSIS — Z992 Dependence on renal dialysis: Secondary | ICD-10-CM | POA: Diagnosis not present

## 2021-10-23 DIAGNOSIS — N2581 Secondary hyperparathyroidism of renal origin: Secondary | ICD-10-CM | POA: Diagnosis not present

## 2021-10-23 DIAGNOSIS — N186 End stage renal disease: Secondary | ICD-10-CM | POA: Diagnosis not present

## 2021-10-24 ENCOUNTER — Ambulatory Visit: Payer: Medicare Other | Admitting: Licensed Clinical Social Worker

## 2021-10-24 ENCOUNTER — Telehealth: Payer: Self-pay | Admitting: Internal Medicine

## 2021-10-24 ENCOUNTER — Encounter: Payer: Self-pay | Admitting: Licensed Clinical Social Worker

## 2021-10-24 DIAGNOSIS — E118 Type 2 diabetes mellitus with unspecified complications: Secondary | ICD-10-CM

## 2021-10-24 DIAGNOSIS — Z741 Need for assistance with personal care: Secondary | ICD-10-CM

## 2021-10-24 DIAGNOSIS — N186 End stage renal disease: Secondary | ICD-10-CM | POA: Diagnosis not present

## 2021-10-24 DIAGNOSIS — N2581 Secondary hyperparathyroidism of renal origin: Secondary | ICD-10-CM | POA: Diagnosis not present

## 2021-10-24 DIAGNOSIS — D631 Anemia in chronic kidney disease: Secondary | ICD-10-CM | POA: Diagnosis not present

## 2021-10-24 NOTE — Chronic Care Management (AMB) (Addendum)
Care Management   Clinical Social Work Note  10/24/2021 Name: Mary Valencia MRN: [id number] DOB: 09-02-1955  Mary Valencia is a 66 y.o. year old female who is a primary care patient of Plotnikov, Evie Lacks, MD. The CCM team was consulted to assist the patient with chronic disease management and/or care coordination needs related to: Level of Care Concerns.   Engaged with patient by telephone for initial visit in response to provider referral for social work chronic care management and care coordination services.   Consent to Services:  The patient was given the following information about Chronic Care Management services today, agreed to services, and gave verbal consent: 1. CCM service includes personalized support from designated clinical staff supervised by the primary care provider, including individualized plan of care and coordination with other care providers 2. 24/7 contact phone numbers for assistance for urgent and routine care needs. 3. Service will only be billed when office clinical staff spend 20 minutes or more in a month to coordinate care. 4. Only one practitioner may furnish and bill the service in a calendar month. 5.The patient may stop CCM services at any time (effective at the end of the month) by phone call to the office staff. 6. The patient will be responsible for cost sharing (co-pay) of up to 20% of the service fee (after annual deductible is met). Patient agreed to services and consent obtained.  Patient agreed to services and consent obtained.   Summary: Assessed patient's current treatment, progress, coping skills, support system and barriers to care.  She continues to experience difficulty with meeting ADL's and barriers with getting correct PCS from completed for needed services. LCSW will collaborate with PCP to get correct form to [facility].  See Care Plan below for interventions and patient self-care actives.  Follow up Plan: Patient would like  continued follow-up from CCM LCSW.  per patient's request will follow up in 2 weeks.  Will call office if needed prior to next encounter.    Assessment: Review of patient past medical history, allergies, medications, and health status, including review of relevant consultants reports was performed today as part of a comprehensive evaluation and provision of chronic care management and care coordination services.     SDOH (Social Determinants of Health) assessments and interventions performed:  no needs Identified  Advanced Directives Status: Not addressed in this encounter.  CCM Care Plan Conditions to be addressed/monitored: ; Level of care concerns  Care Plan : LCSW Plan of Care  Updates made by Maurine Cane, LCSW since 10/24/2021 12:00 AM     Problem: Quality of Life      Goal: Quality of Life Maintained with Personal Care Services   Start Date: 10/24/2021  This Visit's Progress: On track  Priority: High  Note:   Current Barriers:  Level of Care Concerns:Inability to perform ADL's independently Lacks knowledge of how to connect   [place]):  Patient  will patient will work with SW to address concerns related to ADL's  through collaboration with [facility] representative, provider, and care team.   Interventions: 1:1 collaboration with primary care provider regarding development and update of comprehensive plan of care as evidenced by provider attestation and co-signature Inter-disciplinary care team collaboration (see longitudinal plan of care) Evaluation of current treatment plan related to  self management and patient's adherence to plan as established by provider Review resources, discussed options and provided patient information about  Transportation provided by insurance provider (currently using)  Enhanced Benefits connected with insurance provider:(currently using services) Personal Care Services [facility]) :(barrier with getting connected)  Level of Care Concerns  in a patient with CKD Stage , Stroke & Diabetes:  (Status: New goal.) Current level of care: home with other family or significant other(s): family member: brother Evaluation of patient's unmet needs in current living environment ADL's Assessed needs, level of care concerns, how currently meeting needs and barriers to care Reviewed basic eligibility and provided education on Personal Care Service process,  Collaborate with primary care provider ref completing PCS referral ( form sent to PCP and CMA )  Provided list of PCS agencies and what to expect with PCS process  Collaborated with [facility] to verify application received/ processed.   Solution-Focused Strategies employed:  Active listening / Reflection utilized  Problem [place] strategies reviewed  Task & activities to accomplish goals: Review personal care service information and provider list  Select 2 to 3 agencies you would like to use, keep this until your assessment is completed by [facility]. Return calls from [facility] or call them directly if you have questions [phone] or Frederika, [place] Licensed Clinical Social Worker [employer]  [place]  [phone]   Medical screening examination/treatment/procedure(s) were performed by non-physician practitioner and as supervising physician I was immediately available for consultation/collaboration.  I agree with above. Lew Dawes, MD

## 2021-10-24 NOTE — Telephone Encounter (Signed)
[facility] needs to speak to someone about the latest rx for diabetic supplies ?

## 2021-10-24 NOTE — Patient Instructions (Addendum)
Visit Information  ? ?Thank you for taking time to visit with me today. Please don't hesitate to contact me if I can be of assistance to you before our next scheduled telephone appointment. ? ?Following are the goals we discussed today:  ? ?Our next appointment is by telephone on May 15th at 9:30 ? ?Please call the care guide team at [phone] if you need to cancel or reschedule your appointment.  ? ?If you are experiencing a Mental Health or [place] or need someone to talk to, please call 1-800-273-TALK (toll free, 24 hour hotline)  ? ?Following is a copy of your full care plan:  ?Care Plan : [place]  ?Updates made by Maurine Cane, LCSW since 10/24/2021 12:00 AM  ?  ? ?Problem: Quality of Life   ?  ? ?Goal: Quality of Life Maintained with Personal Care Services   ?Start Date: 10/24/2021  ?This Visit's Progress: On track  ?Priority: High  ?Note:   ?Current Barriers:  ?Level of Care Concerns:Inability to perform ADL's independently ?Lacks knowledge of how to connect  ? ?CSW Clinical Goal(s):  ?Patient  will patient will work with SW to address concerns related to ADL's  through collaboration with [facility] representative, provider, and care team.  ? ?Interventions: ?1:1 collaboration with primary care provider regarding development and update of comprehensive plan of care as evidenced by provider attestation and co-signature ?Inter-disciplinary care team collaboration (see longitudinal plan of care) ?Evaluation of current treatment plan related to  self management and patient's adherence to plan as established by provider ?Review resources, discussed options and provided patient information about  ?Transportation provided by insurance provider (currently using) ?Enhanced Benefits connected with insurance provider:(currently using services) ?Personal Care Services [facility]) :(barrier with getting connected) ? ?Level of Care Concerns in a patient with CKD Stage , Stroke & Diabetes:  (Status: New  goal.) ?Current level of care: home with other family or significant other(s): family member: brother ?Evaluation of patient's unmet needs in current living environment ?ADL's ?Assessed needs, level of care concerns, how currently meeting needs and barriers to care Reviewed basic eligibility and provided education on Personal Care Service process,  Collaborate with primary care provider ref completing PCS referral ( form sent to PCP and CMA )  Provided list of PCS agencies and what to expect with PCS process  Collaborated with [facility] to verify application received/ processed.  ? ?Solution-Focused Strategies employed:  ?Active listening / Reflection utilized  ?Problem Solving /Task Center strategies reviewed ? ?Task & activities to accomplish goals: ?Review personal care service information and provider list  ?Review "What Personal Care Services Does" ?Select 2 to 3 agencies you would like to use, keep this until your assessment is completed by [facility]. ?Return calls from [facility] or call them directly if you have questions [phone] or [phone]   ?  ? ? ?Consent to CCM Services: ?Ms. Duross was given information about Chronic Care Management services including:  ?CCM service includes personalized support from designated clinical staff supervised by her physician, including individualized plan of care and coordination with other care providers ?24/7 contact phone numbers for assistance for urgent and routine care needs. ?Service will only be billed when office clinical staff spend 20 minutes or more in a month to coordinate care. ?Only one practitioner may furnish and bill the service in a calendar month. ?The patient may stop CCM services at any time (effective at the end of the  month) by phone call to the office staff. ?The patient will be responsible for cost sharing (co-pay) of up to 20% of the service fee (after annual deductible is met). ? ?Patient agreed to services  and verbal consent obtained.  ? ?The patient verbalized understanding of instructions, educational materials, and care plan provided today and agreed to receive a mailed copy of patient instructions, educational materials, and care plan.  ? ?Casimer Lanius, LCSW ?Licensed Clinical Social Worker [employer]  ?[place]  ?[phone]  ? ? ?  ?

## 2021-10-25 ENCOUNTER — Ambulatory Visit: Payer: Medicare Other | Admitting: *Deleted

## 2021-10-25 DIAGNOSIS — N186 End stage renal disease: Secondary | ICD-10-CM | POA: Diagnosis not present

## 2021-10-25 DIAGNOSIS — E118 Type 2 diabetes mellitus with unspecified complications: Secondary | ICD-10-CM

## 2021-10-25 DIAGNOSIS — D631 Anemia in chronic kidney disease: Secondary | ICD-10-CM | POA: Diagnosis not present

## 2021-10-25 DIAGNOSIS — N2581 Secondary hyperparathyroidism of renal origin: Secondary | ICD-10-CM | POA: Diagnosis not present

## 2021-10-25 NOTE — Patient Instructions (Signed)
Visit Information ? ?Mary Valencia, thank you for taking time to talk with me today. Please don't hesitate to contact me if I can be of assistance to you before our next scheduled telephone appointment ? ?As we discussed today, I have asked the [place] team to contact you by phone to provide resources for food acquisition- please listen out for a call from the Care Guide Team: remember, once you receive the resources you have requested, it will be your responsibility to follow up to obtain any services from the resources that are provided  ? ?I have also placed a referral for the [place] team to contact you to assist you with your medication needs-- please listen out for a call from the Pharmacy team at Dr. Judeen Hammans office ? ?Below are the goals we discussed today:  ?Patient Self-Care Activities: ?Patient Mary Valencia will: ?Take medications as prescribed ?Attend all scheduled provider appointments ?Call pharmacy for medication refills ?Call provider office for new concerns or questions ?Continue to check fasting (first thing in the morning, before eating) and then again 2-hours after eating blood sugars at home ?Monitor your blood sugars using your continuous glucose monitor anytime you feel that your blood sugars may be running lower or higher than they should ?Continue to follow heart healthy, low salt, low cholesterol, carbohydrate-modified, low sugar diet, and renal diet: continue maintaining contact with the nutritionist at your dialysis center ?Continue monitoring your blood pressures at home daily around your normal routine when you administer your peritoneal dialysis ?  ?Our next scheduled telephone follow up visit/ appointment is scheduled on: Tuesday, November 29, 2021 at 9:45 am- This is a PHONE CALL appointment ? ?If you need to cancel or re-schedule our visit, please call [phone] and our care guide team will be happy to assist you. ?  ?I look forward to hearing about your progress. ?   ?Oneta Rack, RN, BSN, CCRN Alumnus ?[place] ?([phone]: direct office ? ?If you are experiencing a Mental Health or [place] or need someone to talk to, please  ?call the Suicide and Crisis Lifeline: 988 ?call the [place] National Suicide Prevention Lifeline: (480)699-4230 or TTY: 508 185 3732 TTY (402)515-6371) to talk to a trained counselor ?call 1-800-273-TALK (toll free, 24 hour hotline) ?go to [facility] [address] [place] [phone]) ?call 911  ? ?Hypoglycemia ?Hypoglycemia occurs when the level of sugar (glucose) in the blood is too low. Hypoglycemia can happen in people who have or do not have diabetes. It can develop quickly, and it can be a medical emergency. For most people, a blood glucose level below 70 mg/dL (3.9 mmol/L) is considered hypoglycemia. ?Glucose is a type of sugar that provides the body's main source of energy. Certain hormones (insulin and glucagon) control the level of glucose in the blood. Insulin lowers blood glucose, and glucagon raises blood glucose. Hypoglycemia can result from having too much insulin in the bloodstream, or from not eating enough food that contains glucose. You may also have reactive hypoglycemia, which happens within 4 hours after eating a meal. ?What are the causes? ?Hypoglycemia occurs most often in people who have diabetes and may be caused by: ?Diabetes medicine. ?Not eating enough, or not eating often enough. ?Increased physical activity. ?Drinking alcohol on an empty stomach. ?If you do not have diabetes, hypoglycemia may be caused by: ?A tumor in the pancreas. ?Not eating enough, or not eating for long periods at  a time (fasting). ?A severe infection or illness. ?Problems after having bariatric surgery. ?Organ failure, such as kidney or liver failure. ?Certain medicines. ?What increases the risk? ?Hypoglycemia is more likely to develop in  people who: ?Have diabetes and take medicines to lower blood glucose. ?Abuse alcohol. ?Have a severe illness. ?What are the signs or symptoms? ?Symptoms vary depending on whether the condition is mild, moderate, or severe. ?Mild hypoglycemia ?Hunger. ?Sweating and feeling clammy. ?Dizziness or feeling light-headed. ?Sleepiness or restless sleep. ?Nausea. ?Increased heart rate. ?Headache. ?Blurry vision. ?Mood changes, such as irritability or anxiety. ?Tingling or numbness around the mouth, lips, or tongue. ?Moderate hypoglycemia ?Confusion and poor judgment. ?Behavior changes. ?Weakness. ?Irregular heartbeat. ?A change in coordination. ?Severe hypoglycemia ?Severe hypoglycemia is a medical emergency. It can cause: ?Fainting. ?Seizures. ?Loss of consciousness (coma). ?Death. ?How is this diagnosed? ?Hypoglycemia is diagnosed with a blood test to measure your blood glucose level. This blood test is done while you are having symptoms. Your health care provider may also do a physical exam and review your medical history. ?How is this treated? ?This condition can be treated by immediately eating or drinking something that contains sugar with 15 grams of fast-acting carbohydrate, such as: ?4 oz (120 mL) of fruit juice. ?4 oz (120 mL) of regular soda (not diet soda). ?Several pieces of hard candy. Check food labels to find out how many pieces to eat for 15 grams. ?1 Tbsp (15 mL) of sugar or honey. ?4 glucose tablets. ?1 tube of glucose gel. ?Treating hypoglycemia if you have diabetes ?If you are alert and able to swallow safely, follow the 15:15 rule: ?Take 15 grams of a fast-acting carbohydrate. Talk with your health care provider about how much you should take. Options for getting 15 grams of fast-acting carbohydrate include: ?Glucose tablets (take 4 tablets). ?Several pieces of hard candy. Check food labels to find out how many pieces to eat for 15 grams. ?4 oz (120 mL) of fruit juice. ?4 oz (120 mL) of regular soda  (not diet soda). ?1 Tbsp (15 mL) of sugar or honey. ?1 tube of glucose gel. ?Check your blood glucose 15 minutes after you take the carbohydrate. ?If the repeat blood glucose level is still at or below 70 mg/dL (3.9 mmol/L), take 15 grams of a carbohydrate again. ?If your blood glucose level does not increase above 70 mg/dL (3.9 mmol/L) after 3 tries, seek emergency medical care. ?After your blood glucose level returns to normal, eat a meal or a snack within 1 hour. ? ?Treating severe hypoglycemia ?Severe hypoglycemia is when your blood glucose level is below 54 mg/dL (3 mmol/L). Severe hypoglycemia is a medical emergency. Get medical help right away. ?If you have severe hypoglycemia and you cannot eat or drink, you will need to be given glucagon. A family member or close friend should learn how to check your blood glucose and how to give you glucagon. Ask your health care provider if you need to have an emergency glucagon kit available. ?Severe hypoglycemia may need to be treated in a hospital. The treatment may include getting glucose through an IV. You may also need treatment for the cause of your hypoglycemia. ?Follow these instructions at home: ? ?General instructions ?Take over-the-counter and prescription medicines only as told by your health care provider. ?Monitor your blood glucose as told by your health care provider. ?If you drink alcohol: ?Limit how much you have to: ?0-1 drink a day for women who are not pregnant. ?0-2 drinks a  day for men. ?Know how much alcohol is in your drink. In the [place] one drink equals one 12 oz bottle of beer (355 mL), one 5 oz glass of wine (148 mL), or one 1? oz glass of hard liquor (44 mL). ?Be sure to eat food along with drinking alcohol. ?Be aware that alcohol is absorbed quickly and may have lingering effects that may result in hypoglycemia later. Be sure to do ongoing glucose monitoring. ?Keep all follow-up visits. This is important. ?If you have diabetes: ?Always have  a fast-acting carbohydrate (15 grams) option with you to treat low blood glucose. ?Follow your diabetes management plan as directed by your health care provider. Make sure you: ?Know the symptoms of hypo

## 2021-10-25 NOTE — Chronic Care Management (AMB) (Signed)
? Care Management ?  ? RN Visit Note ? ?10/25/2021 ?Name: Mary Valencia MRN: [id number] DOB: [birth year] ? ?Subjective: ?Mary Valencia is a 66 y.o. year old female who is a primary care patient of Plotnikov, Evie Lacks, MD. The care management team was consulted for assistance with disease management and care coordination needs.   ? ?Engaged with patient by telephone for initial visit in response to provider referral for case management and/or care coordination services.  ? ?Consent to Services:  ? Mary Valencia was given information about Care Management services 10/13/21 including:  ?Care Management services includes personalized support from designated clinical staff supervised by her physician, including individualized plan of care and coordination with other care providers ?24/7 contact phone numbers for assistance for urgent and routine care needs. ?The patient may stop case management services at any time by phone call to the office staff. ? ?Patient agreed to services and consent obtained.  ? ?Assessment: Review of patient past medical history, allergies, medications, health status, including review of consultants reports, laboratory and other test data, was performed as part of comprehensive evaluation and provision of chronic care management services.  ? ?SDOH (Social Determinants of Health) assessments and interventions performed:  ?SDOH Interventions   ? ?Flowsheet Row Most Recent Value  ?SDOH Interventions   ?Food Insecurity Interventions Other (Comment)  [Community Resource Care Guide referral placed for moderate food insecurity- gets $305.00 insurance food allowance,  does not get food stamps]  ?Housing Interventions Intervention Not Indicated  [currently lives with brother in single family one level home x 8 months,  denies safety concerns aorund housing]  ?Transportation Interventions Intervention Not Indicated  [uses insurance benefit for transportation to provider appointments,  family assists with  transportation for errands]  ? ?  ? ?Care Plan ? ?Allergies  ?Allergen Reactions  ? Diltiazem Swelling  ?  EDEMA ?  ? Iodinated Contrast Media Hives  ? Betadine [Povidone Iodine] Other (See Comments)  ?  Pt says it burns  ? Clonidine Derivatives   ?  Dropped BP  ? Gabapentin   ?  A bad reaction - confused  ? Iodine Hives  ? Penicillins Swelling  ? Sulfasalazine Hives  ? Sulfonamide Derivatives Hives  ? Tetracyclines & Related Nausea And Vomiting  ? Ceftriaxone Itching  ? Ciprofloxacin Itching  ? Naproxen Rash  ? ?Outpatient Encounter Medications as of 10/25/2021  ?Medication Sig Note  ? Alcohol Swabs (B-D SINGLE USE SWABS REGULAR) PADS Use as directed to clean site to check blood sugars   ? calcitRIOL (ROCALTROL) 0.5 MCG capsule Take by mouth. TAKE 1 BY MOUTH DAILY   ? Continuous Blood Gluc Receiver (FREESTYLE LIBRE 14 DAY READER) DEVI 1 Units by Does not apply route daily.   ? Continuous Blood Gluc Sensor (FREESTYLE LIBRE 14 DAY SENSOR) MISC 1 Units by Does not apply route every 14 (fourteen) days.   ? diphenhydrAMINE (BENADRYL) 25 mg capsule Take 25 mg by mouth as needed. 10/06/2015: Received from: [place]: Take 25 mg by mouth.  ? glucose blood test strip TEST BLOOD GLUCOSE THREE TIMES DAILY   ? hydrALAZINE (APRESOLINE) 50 MG tablet TAKE 1 BY  MOUTH TWICE A DAY   ? insulin NPH Human (NOVOLIN N) 100 UNIT/ML injection Inject into the skin. Inject 8 Units into the skin 2 times daily with meals   ? losartan (COZAAR) 50 MG tablet Take 1 tablet (50 mg total) by mouth daily. (Patient taking differently: Take 50 mg  by mouth in the morning and at bedtime.)   ? mupirocin cream (BACTROBAN) 2 % Apply 1 application topically daily.   ? NIFEdipine (PROCARDIA XL/NIFEDICAL-XL) 90 MG 24 hr tablet    ? omeprazole (PRILOSEC) 40 MG capsule TAKE 1 CAPSULE EVERY DAY   ? polyethylene glycol (MIRALAX / GLYCOLAX) packet Take 17 g by mouth.   ? PRESCRIPTION MEDICATION 210 mg 3 (three) times daily before meals.   ?  senna-docusate (SENOKOT-S) 8.6-50 MG tablet Take by mouth 2 (two) times daily. Take 2 tab twice daily.   ? VELPHORO 500 MG chewable tablet CHEW 1 THREE TIMES A DAY WITH MEALS   ? ?No facility-administered encounter medications on file as of 10/25/2021.  ? ?Patient Active Problem List  ? Diagnosis Date Noted  ? Decreased functional mobility and endurance 09/20/2021  ? ESRD on hemodialysis ([place]) 09/20/2021  ? Hx of transient ischemic attack (TIA) 09/20/2021  ? Pressure ulcer of coccygeal region, stage 1 08/06/2021  ? Grieving 10/12/2020  ? Shoulder pain, right 10/11/2020  ? Hematoma and contusion 08/23/2020  ? Intertrigo 04/06/2020  ? Pressure sore 04/06/2020  ? Acute respiratory failure due to COVID-19 [facility]) 04/05/2020  ? Foot ulcer ([place]) 12/24/2019  ? Ankle sprain 12/24/2019  ? Hypomagnesemia 10/12/2019  ? History of cardioembolic cerebrovascular accident (CVA) 09/22/2019  ? Daytime somnolence 09/09/2019  ? Laceration of right lower leg 02/26/2019  ? History of colon polyps 11/12/2017  ? Liver cirrhosis ([place]) 09/21/2017  ? CAD (coronary artery disease) 09/21/2017  ? Arthritis 05/21/2017  ? Blurred vision, left eye 05/21/2017  ? Glomerulonephritis 05/21/2017  ? H/O unilateral nephrectomy 05/21/2017  ? Hx of gastric ulcer 05/21/2017  ? Neuropathy of both feet 05/21/2017  ? Situational anxiety 05/21/2017  ? Positive cardiac stress test 02/22/2017  ? Pre-transplant evaluation for kidney transplant 12/25/2016  ? Type 2 diabetes mellitus with hyperosmolar nonketotic hyperglycemia ([place]) 12/19/2016  ? UTI (urinary tract infection) 12/19/2016  ? Anemia 11/01/2016  ? Diarrhea 11/01/2016  ? Volume overload 03/31/2016  ? AKI (acute kidney injury) ([place]) 03/14/2016  ? ESRD (end stage renal disease) on dialysis ([place]) 10/06/2015  ? Situational mixed anxiety and depressive disorder 10/06/2015  ? Iron deficiency anemia 10/03/2015  ? Acquired solitary kidney 09/03/2015  ? Metabolic bone disease 54/02/8118  ? Vitamin D deficiency  09/03/2015  ? Hyperkalemia 02/01/2013  ? Edema 10/27/2012  ? TIA (transient ischemic attack) 03/21/2012  ? HLD (hyperlipidemia) 02/16/2012  ? Encephalopathy acute 01/29/2012  ? Cerebral infarction, unspecified ([place]) 01/25/2012  ? Weight loss 01/24/2012  ? LBP (low back pain) 01/24/2012  ? Chest pain, atypical 09/25/2011  ? Vagina bleeding 01/18/2011  ? ALLERGIC RHINITIS 09/05/2010  ? TACHYCARDIA 09/05/2010  ? DYSPNEA 09/05/2010  ? Diabetes mellitus type 2 with complications ([place]) 14/78/2956  ? Dyslipidemia 02/19/2007  ? Renal hypertension 02/19/2007  ? GERD 02/19/2007  ? Essential hypertension 02/19/2007  ? ?Conditions to be addressed/monitored:   DMII and ESRD ? ?Care Plan : RN Care Manager Plan of Care  ?Updates made by Knox Royalty, RN since 10/25/2021 12:00 AM  ?  ? ?Problem: Chronic Disease Management Needs   ?Priority: High  ?  ? ?Long-Range Goal: Development of plan of care for long term chronic disease management   ?Start Date: 10/25/2021  ?Expected End Date: 10/26/2022  ?Priority: High  ?Note:   ?Current Barriers:  ?Chronic Disease Management support and education needs related to DMII and ESRD ?Need for assistance with ADL's and iADL's-- Clinic CSW currently  active and assisting patient-- she would like to have personal care services initiated ?Moderate food insecurity: community resource care guide referral placed ?Need for clinic pharmacy support with medication optimization; assistance with CGM supplies: Clinic pharmacy referral placed ? ?RNCM Clinical Goal(s):  ?Patient will demonstrate ongoing health management independence as evidenced by adherence to plan of care for DMII and ESRD- on peritoneal dialysis        through collaboration with RN Care manager, provider, and care team.  ? ?Interventions: ?1:1 collaboration with primary care provider regarding development and update of comprehensive plan of care as evidenced by provider attestation and co-signature ?Inter-disciplinary care team collaboration  (see longitudinal plan of care) ?Evaluation of current treatment plan related to  self management and patient's adherence to plan as established by provider ?Review of patient status, including review of consultants

## 2021-10-26 ENCOUNTER — Telehealth: Payer: Self-pay | Admitting: *Deleted

## 2021-10-26 DIAGNOSIS — N2581 Secondary hyperparathyroidism of renal origin: Secondary | ICD-10-CM | POA: Diagnosis not present

## 2021-10-26 DIAGNOSIS — N186 End stage renal disease: Secondary | ICD-10-CM | POA: Diagnosis not present

## 2021-10-26 DIAGNOSIS — D631 Anemia in chronic kidney disease: Secondary | ICD-10-CM | POA: Diagnosis not present

## 2021-10-26 NOTE — Telephone Encounter (Signed)
Tried calling number it asked for an ext or press the numbers prompts for right connection however, it only allows you to lvm. ?

## 2021-10-26 NOTE — Telephone Encounter (Signed)
? ?  Telephone encounter was:  Successful.  ?10/26/2021 ?Name: YAHIRA TIMBERMAN MRN: [id number] DOB: 03/18/1956 ? ?OREOLUWA GILMER is a 66 y.o. year old female who is a primary care patient of Plotnikov, Evie Lacks, MD . The community resource team was consulted for assistance with Food Insecurity ? ?Care guide performed the following interventions: patient provided app for [place] [employer] wll also email more food banks and senior reources congragate meals as well information on housing maintence for seniors . ? ?Follow Up Plan:  Care guide will follow up with patient by phone over the next days after calling meals on wheels  ? ?Lovett Sox -Selinda Eon ?Care Guide , Embedded Care Coordination ?[place], Care Management  ?[phone] ?[address] , [place] [address] ?Email : Ashby Dawes. Greenauer-moran [email or web address] ?  ? ?

## 2021-10-27 DIAGNOSIS — N2581 Secondary hyperparathyroidism of renal origin: Secondary | ICD-10-CM | POA: Diagnosis not present

## 2021-10-27 DIAGNOSIS — D631 Anemia in chronic kidney disease: Secondary | ICD-10-CM | POA: Diagnosis not present

## 2021-10-27 DIAGNOSIS — N186 End stage renal disease: Secondary | ICD-10-CM | POA: Diagnosis not present

## 2021-10-28 DIAGNOSIS — N2581 Secondary hyperparathyroidism of renal origin: Secondary | ICD-10-CM | POA: Diagnosis not present

## 2021-10-28 DIAGNOSIS — E119 Type 2 diabetes mellitus without complications: Secondary | ICD-10-CM | POA: Diagnosis not present

## 2021-10-28 DIAGNOSIS — N186 End stage renal disease: Secondary | ICD-10-CM | POA: Diagnosis not present

## 2021-10-28 DIAGNOSIS — D631 Anemia in chronic kidney disease: Secondary | ICD-10-CM | POA: Diagnosis not present

## 2021-10-29 DIAGNOSIS — D631 Anemia in chronic kidney disease: Secondary | ICD-10-CM | POA: Diagnosis not present

## 2021-10-29 DIAGNOSIS — N2581 Secondary hyperparathyroidism of renal origin: Secondary | ICD-10-CM | POA: Diagnosis not present

## 2021-10-29 DIAGNOSIS — N186 End stage renal disease: Secondary | ICD-10-CM | POA: Diagnosis not present

## 2021-10-30 DIAGNOSIS — D631 Anemia in chronic kidney disease: Secondary | ICD-10-CM | POA: Diagnosis not present

## 2021-10-30 DIAGNOSIS — N2581 Secondary hyperparathyroidism of renal origin: Secondary | ICD-10-CM | POA: Diagnosis not present

## 2021-10-30 DIAGNOSIS — N186 End stage renal disease: Secondary | ICD-10-CM | POA: Diagnosis not present

## 2021-10-31 DIAGNOSIS — N2581 Secondary hyperparathyroidism of renal origin: Secondary | ICD-10-CM | POA: Diagnosis not present

## 2021-10-31 DIAGNOSIS — N186 End stage renal disease: Secondary | ICD-10-CM | POA: Diagnosis not present

## 2021-10-31 DIAGNOSIS — D631 Anemia in chronic kidney disease: Secondary | ICD-10-CM | POA: Diagnosis not present

## 2021-11-01 ENCOUNTER — Ambulatory Visit: Payer: Self-pay | Admitting: Licensed Clinical Social Worker

## 2021-11-01 ENCOUNTER — Telehealth: Payer: Self-pay | Admitting: *Deleted

## 2021-11-01 ENCOUNTER — Other Ambulatory Visit: Payer: Self-pay

## 2021-11-01 DIAGNOSIS — Z741 Need for assistance with personal care: Secondary | ICD-10-CM

## 2021-11-01 DIAGNOSIS — N186 End stage renal disease: Secondary | ICD-10-CM | POA: Diagnosis not present

## 2021-11-01 DIAGNOSIS — N2581 Secondary hyperparathyroidism of renal origin: Secondary | ICD-10-CM | POA: Diagnosis not present

## 2021-11-01 DIAGNOSIS — D631 Anemia in chronic kidney disease: Secondary | ICD-10-CM | POA: Diagnosis not present

## 2021-11-01 MED ORDER — FREESTYLE LIBRE 14 DAY SENSOR MISC: 1.0000 [IU] | 3 refills | Status: DC

## 2021-11-01 NOTE — Telephone Encounter (Signed)
RX has been sent via fax. ?

## 2021-11-01 NOTE — Telephone Encounter (Signed)
? ?  Telephone encounter was:  Successful.  ?11/01/2021 ?Name: Mary Valencia MRN: [id number] DOB: 1956-04-07 ? ?Mary Valencia is a 66 y.o. year old female who is a primary care patient of Plotnikov, Evie Lacks, MD . The community resource team was consulted for assistance with Food Insecurity ? ?Care guide performed the following interventions: Emailed Mr Orma Render at senior resources have left messages but no response trying emailing him to find out the situation about patient  meals on wheels pickups as they had told patient she was aproved but she has not been provided any other information . ? ?Follow Up Plan:  Will reach outo patient again when I have a response ? ?Lovett Sox -Selinda Eon ?Care Guide , Embedded Care Coordination ?[place], Care Management  ?[phone] ?[address] , [place] [address] ?Email : Ashby Dawes. Greenauer-moran [email or web address] ?  ? ?

## 2021-11-01 NOTE — Patient Instructions (Signed)
? ? ?  Patient was not contacted during this encounter.  LCSW collaborated with care team to accomplish patient's care plan goal  ? ?Casimer Lanius, LCSW ?Licensed Clinical Social Worker [facility]  ?[place]  ?[phone]  ?

## 2021-11-01 NOTE — Chronic Care Management (AMB) (Signed)
?   Care Management  ? Clinical Social Work Note ? ?11/01/2021 ?Name: Mary Valencia MRN: [id number] DOB: [birth year] ? ?Mary Valencia is a 66 y.o. year old female who is a primary care patient of Plotnikov, Evie Lacks, MD. The CCM team was consulted to assist the patient with chronic disease management and/or care coordination needs related to: Level of Care Concerns.  ? ?Summary: Patient was not interviewed or contacted during this encounter ?CCM LCSW collaborated with [place] and [place]  to assist with meeting patient's needs.  Marland [facility].  See Care Plan below for interventions and patient self-care actives. ? ?Follow up Plan:  f/u appointment with patient May 15th  ?  ?Assessment: Review of patient past medical history, allergies, medications, and health status, including review of relevant consultants reports was performed today as part of a comprehensive evaluation and provision of chronic care management and care coordination services.    ? ?SDOH (Social Determinants of Health) assessments and interventions performed:   ? ?Advanced Directives Status: Not addressed in this encounter. ? ?CCM Care Plan ?Conditions to be addressed/monitored: ; Level of care concerns ? ?Care Plan : LCSW Plan of Care  ?Updates made by Maurine Cane, LCSW since 11/01/2021 12:00 AM  ?  ? ?Problem: Quality of Life   ?  ? ?Goal: Quality of Life Maintained with Personal Care Services   ?Start Date: 10/24/2021  ?This Visit's Progress: On track  ?Recent Progress: On track  ?Priority: High  ?Note:   ?Current Barriers:  ?Level of Care Concerns:Inability to perform ADL's independently ?Lacks knowledge of how to connect  ? ?CSW Clinical Goal(s):  ?Patient  will patient will work with SW to address concerns related to ADL's  through collaboration with [place] representative, provider, and care team.  ? ?Interventions: ?1:1 collaboration with primary care provider regarding development and update of  comprehensive plan of care as evidenced by provider attestation and co-signature ?Inter-disciplinary care team collaboration (see longitudinal plan of care) ?Evaluation of current treatment plan related to  self management and patient's adherence to plan as established by provider ?Review resources, discussed options and provided patient information about  ?Transportation provided by insurance provider (currently using) ?Enhanced Benefits connected with insurance provider:(currently using services) ?Personal Care Services [facility]) :(barrier with getting connected) ? ?Level of Care Concerns in a patient with CKD Stage , Stroke & Diabetes:  (Status: Goal on Track (progressing): YES.) ?Current level of care: home with other family or significant other(s): family member: brother ?Evaluation of patient's unmet needs in current living environment ?ADL's ?Assessed needs, level of care concerns, how currently meeting needs and barriers to care ?Collaborate with primary care provider ref completing PCS referral ( form sent to PCP and CMA )  ?PCS referral faxed to [employer] at [phone] (11/01/21) ? ?Task & activities to accomplish goals: ?Review personal care service information and provider list  ?Select 2 to 3 agencies you would like to use, keep this until your assessment is completed by [facility]. ?Return calls from [facility] or call them directly if you have questions [phone] or [phone]   ?  ?  ? ?Casimer Lanius, LCSW ?Licensed Clinical Social Worker [facility]  ?[place]  ?[phone]  ? ? ?

## 2021-11-01 NOTE — Telephone Encounter (Addendum)
Pt is calling to request a Rx for Continuous Blood Gluc Sensor (FREESTYLE LIBRE 14 DAY SENSOR)   ? ?Pharmacy: ?[facility] ?Phone [phone] ?Fax [phone] ? ? ?

## 2021-11-02 DIAGNOSIS — D631 Anemia in chronic kidney disease: Secondary | ICD-10-CM | POA: Diagnosis not present

## 2021-11-02 DIAGNOSIS — N186 End stage renal disease: Secondary | ICD-10-CM | POA: Diagnosis not present

## 2021-11-02 DIAGNOSIS — N2581 Secondary hyperparathyroidism of renal origin: Secondary | ICD-10-CM | POA: Diagnosis not present

## 2021-11-03 ENCOUNTER — Telehealth: Payer: Self-pay | Admitting: Internal Medicine

## 2021-11-03 DIAGNOSIS — I10 Essential (primary) hypertension: Secondary | ICD-10-CM | POA: Diagnosis not present

## 2021-11-03 DIAGNOSIS — E8779 Other fluid overload: Secondary | ICD-10-CM | POA: Diagnosis not present

## 2021-11-03 DIAGNOSIS — Z91148 Patient's other noncompliance with medication regimen for other reason: Secondary | ICD-10-CM | POA: Diagnosis not present

## 2021-11-03 DIAGNOSIS — I2510 Atherosclerotic heart disease of native coronary artery without angina pectoris: Secondary | ICD-10-CM | POA: Diagnosis not present

## 2021-11-03 DIAGNOSIS — E871 Hypo-osmolality and hyponatremia: Secondary | ICD-10-CM | POA: Diagnosis not present

## 2021-11-03 DIAGNOSIS — R0902 Hypoxemia: Secondary | ICD-10-CM | POA: Diagnosis not present

## 2021-11-03 DIAGNOSIS — R0602 Shortness of breath: Secondary | ICD-10-CM | POA: Diagnosis not present

## 2021-11-03 DIAGNOSIS — E1122 Type 2 diabetes mellitus with diabetic chronic kidney disease: Secondary | ICD-10-CM | POA: Diagnosis not present

## 2021-11-03 DIAGNOSIS — E872 Acidosis, unspecified: Secondary | ICD-10-CM | POA: Diagnosis not present

## 2021-11-03 DIAGNOSIS — Z7982 Long term (current) use of aspirin: Secondary | ICD-10-CM | POA: Diagnosis not present

## 2021-11-03 DIAGNOSIS — E340 Carcinoid syndrome: Secondary | ICD-10-CM | POA: Diagnosis not present

## 2021-11-03 DIAGNOSIS — Z8673 Personal history of transient ischemic attack (TIA), and cerebral infarction without residual deficits: Secondary | ICD-10-CM | POA: Diagnosis not present

## 2021-11-03 DIAGNOSIS — I6782 Cerebral ischemia: Secondary | ICD-10-CM | POA: Diagnosis not present

## 2021-11-03 DIAGNOSIS — J9601 Acute respiratory failure with hypoxia: Secondary | ICD-10-CM | POA: Diagnosis not present

## 2021-11-03 DIAGNOSIS — R5383 Other fatigue: Secondary | ICD-10-CM | POA: Diagnosis not present

## 2021-11-03 DIAGNOSIS — J811 Chronic pulmonary edema: Secondary | ICD-10-CM | POA: Diagnosis not present

## 2021-11-03 DIAGNOSIS — R7989 Other specified abnormal findings of blood chemistry: Secondary | ICD-10-CM | POA: Diagnosis not present

## 2021-11-03 DIAGNOSIS — E8770 Fluid overload, unspecified: Secondary | ICD-10-CM | POA: Diagnosis not present

## 2021-11-03 DIAGNOSIS — G319 Degenerative disease of nervous system, unspecified: Secondary | ICD-10-CM | POA: Diagnosis not present

## 2021-11-03 DIAGNOSIS — Z992 Dependence on renal dialysis: Secondary | ICD-10-CM | POA: Diagnosis not present

## 2021-11-03 DIAGNOSIS — Z794 Long term (current) use of insulin: Secondary | ICD-10-CM | POA: Diagnosis not present

## 2021-11-03 DIAGNOSIS — R918 Other nonspecific abnormal finding of lung field: Secondary | ICD-10-CM | POA: Diagnosis not present

## 2021-11-03 DIAGNOSIS — I120 Hypertensive chronic kidney disease with stage 5 chronic kidney disease or end stage renal disease: Secondary | ICD-10-CM | POA: Diagnosis not present

## 2021-11-03 DIAGNOSIS — R0989 Other specified symptoms and signs involving the circulatory and respiratory systems: Secondary | ICD-10-CM | POA: Diagnosis not present

## 2021-11-03 DIAGNOSIS — E873 Alkalosis: Secondary | ICD-10-CM | POA: Diagnosis not present

## 2021-11-03 DIAGNOSIS — E162 Hypoglycemia, unspecified: Secondary | ICD-10-CM | POA: Diagnosis not present

## 2021-11-03 DIAGNOSIS — J984 Other disorders of lung: Secondary | ICD-10-CM | POA: Diagnosis not present

## 2021-11-03 DIAGNOSIS — Z20822 Contact with and (suspected) exposure to covid-19: Secondary | ICD-10-CM | POA: Diagnosis not present

## 2021-11-03 DIAGNOSIS — R9431 Abnormal electrocardiogram [ECG] [EKG]: Secondary | ICD-10-CM | POA: Diagnosis not present

## 2021-11-03 DIAGNOSIS — N186 End stage renal disease: Secondary | ICD-10-CM | POA: Diagnosis not present

## 2021-11-03 DIAGNOSIS — R531 Weakness: Secondary | ICD-10-CM | POA: Diagnosis not present

## 2021-11-03 DIAGNOSIS — E8729 Other acidosis: Secondary | ICD-10-CM | POA: Diagnosis not present

## 2021-11-03 DIAGNOSIS — I517 Cardiomegaly: Secondary | ICD-10-CM | POA: Diagnosis not present

## 2021-11-03 DIAGNOSIS — E785 Hyperlipidemia, unspecified: Secondary | ICD-10-CM | POA: Diagnosis not present

## 2021-11-03 DIAGNOSIS — E1100 Type 2 diabetes mellitus with hyperosmolarity without nonketotic hyperglycemic-hyperosmolar coma (NKHHC): Secondary | ICD-10-CM | POA: Diagnosis not present

## 2021-11-03 DIAGNOSIS — J439 Emphysema, unspecified: Secondary | ICD-10-CM | POA: Diagnosis not present

## 2021-11-03 DIAGNOSIS — D631 Anemia in chronic kidney disease: Secondary | ICD-10-CM | POA: Diagnosis not present

## 2021-11-03 DIAGNOSIS — N2581 Secondary hyperparathyroidism of renal origin: Secondary | ICD-10-CM | POA: Diagnosis not present

## 2021-11-03 DIAGNOSIS — J90 Pleural effusion, not elsewhere classified: Secondary | ICD-10-CM | POA: Diagnosis not present

## 2021-11-03 DIAGNOSIS — R739 Hyperglycemia, unspecified: Secondary | ICD-10-CM | POA: Diagnosis not present

## 2021-11-03 DIAGNOSIS — E1110 Type 2 diabetes mellitus with ketoacidosis without coma: Secondary | ICD-10-CM | POA: Diagnosis not present

## 2021-11-03 NOTE — Progress Notes (Signed)
?  Chronic Care Management  ? ?Note ? ?11/03/2021 ?Name: Mary Valencia MRN: [id number] DOB: 06-13-1956 ? ?Mary Valencia is a 66 y.o. year old female who is a primary care patient of Plotnikov, Evie Lacks, MD. I reached out to Knox Royalty by phone today in response to a referral sent by Ms. [name]'s PCP, Plotnikov, Evie Lacks, MD.  ? ?[name] was given information about Chronic Care Management services today including:  ?CCM service includes personalized support from designated clinical staff supervised by her physician, including individualized plan of care and coordination with other care providers ?24/7 contact phone numbers for assistance for urgent and routine care needs. ?Service will only be billed when office clinical staff spend 20 minutes or more in a month to coordinate care. ?Only one practitioner may furnish and bill the service in a calendar month. ?The patient may stop CCM services at any time (effective at the end of the month) by phone call to the office staff. ? ? ?Patient agreed to services and verbal consent obtained.  ? ?Follow up plan:REFERRAL/ NO COPAY ? ? ?Tatjana Dellinger ?Upstream Scheduler  ?

## 2021-11-04 DIAGNOSIS — E162 Hypoglycemia, unspecified: Secondary | ICD-10-CM | POA: Diagnosis not present

## 2021-11-04 DIAGNOSIS — E8729 Other acidosis: Secondary | ICD-10-CM | POA: Insufficient documentation

## 2021-11-04 DIAGNOSIS — R918 Other nonspecific abnormal finding of lung field: Secondary | ICD-10-CM | POA: Insufficient documentation

## 2021-11-04 DIAGNOSIS — E1100 Type 2 diabetes mellitus with hyperosmolarity without nonketotic hyperglycemic-hyperosmolar coma (NKHHC): Secondary | ICD-10-CM | POA: Insufficient documentation

## 2021-11-04 DIAGNOSIS — R7989 Other specified abnormal findings of blood chemistry: Secondary | ICD-10-CM | POA: Insufficient documentation

## 2021-11-04 DIAGNOSIS — E1122 Type 2 diabetes mellitus with diabetic chronic kidney disease: Secondary | ICD-10-CM | POA: Diagnosis not present

## 2021-11-04 DIAGNOSIS — E872 Acidosis, unspecified: Secondary | ICD-10-CM | POA: Insufficient documentation

## 2021-11-04 DIAGNOSIS — Z992 Dependence on renal dialysis: Secondary | ICD-10-CM | POA: Diagnosis not present

## 2021-11-04 DIAGNOSIS — N186 End stage renal disease: Secondary | ICD-10-CM | POA: Diagnosis not present

## 2021-11-04 DIAGNOSIS — Z794 Long term (current) use of insulin: Secondary | ICD-10-CM | POA: Diagnosis not present

## 2021-11-04 DIAGNOSIS — E873 Alkalosis: Secondary | ICD-10-CM | POA: Insufficient documentation

## 2021-11-04 DIAGNOSIS — J9690 Respiratory failure, unspecified, unspecified whether with hypoxia or hypercapnia: Secondary | ICD-10-CM | POA: Insufficient documentation

## 2021-11-07 ENCOUNTER — Ambulatory Visit: Payer: Medicare Other | Admitting: Licensed Clinical Social Worker

## 2021-11-07 DIAGNOSIS — N186 End stage renal disease: Secondary | ICD-10-CM | POA: Diagnosis not present

## 2021-11-07 DIAGNOSIS — Z741 Need for assistance with personal care: Secondary | ICD-10-CM

## 2021-11-07 DIAGNOSIS — D631 Anemia in chronic kidney disease: Secondary | ICD-10-CM | POA: Diagnosis not present

## 2021-11-07 DIAGNOSIS — N2581 Secondary hyperparathyroidism of renal origin: Secondary | ICD-10-CM | POA: Diagnosis not present

## 2021-11-07 NOTE — Patient Instructions (Signed)
Visit Information ? ?Thank you for taking time to visit with me today. Please don't hesitate to contact me if I can be of assistance to you before our next scheduled telephone appointment. ? ?Following are the goals we discussed today: Personal Care Services ? ?Task & activities to accomplish goals: ?Review personal care service information and provider list  ?Select 2 to 3 agencies you would like to use, keep this until your assessment is completed by [facility]. ?Return calls from [facility] or call them directly if you have questions [phone] or [phone]   ?You may call [facility] on or after 11/03/2021 to schedule your intake assessment ?   ?  ?Casimer Lanius, LCSW ?Licensed Clinical Social Worker [facility]  ?[place]  ?[phone]  ? ?Our next appointment is by telephone on July 3rd at 10:00 ? ?Please call the care guide team at [phone] if you need to cancel or reschedule your appointment.  ? ?If you are experiencing a Mental Health or [place] or need someone to talk to, please call 1-800-273-TALK (toll free, 24 hour hotline)  ? ?Patient verbalizes understanding of instructions and care plan provided today and agrees to view in [place]. Active MyChart status confirmed with patient.   ? ? ?

## 2021-11-07 NOTE — Chronic Care Management (AMB) (Signed)
Care Management ?Clinical Social Work Note ? ?11/07/2021 ?Name: Mary Valencia MRN: [id number] DOB: 1955/11/10 ? ?Mary Valencia is a 66 y.o. year old female who is a primary care patient of Plotnikov, Evie Lacks, MD.  The Care Management team was consulted for assistance with chronic disease management and coordination needs. ? ?Engaged with patient by telephone for follow up visit in response to provider referral for social work chronic care management and care coordination services ? ?Consent to Services:  ?Ms. Caples was given information about Care Management services today including:  ?Care Management services includes personalized support from designated clinical staff supervised by her physician, including individualized plan of care and coordination with other care providers ?24/7 contact phone numbers for assistance for urgent and routine care needs. ?The patient may stop case management services at any time by phone call to the office staff. ? ?Patient agreed to services and consent obtained.  ? ?Summary: Assessed patient's current treatment, progress, coping skills, support system and barriers to care. Report currently in the hospital in [place] since 11/03/21. Anticipated discharge today 11/07/21.  Provided updated on PCS referral. See Care Plan below for interventions and patient self-care actives. ? ?Recommendation: Patient may benefit from, and is in agreement to call [facility] in 3 days to schedule her intake assessment.  ? ?Follow up Plan: Patient would like continued follow-up from CCM LCSW.  per patient's request will follow up in 2 months.  Will call office if needed prior to next encounter. ?  ?Assessment: Review of patient past medical history, allergies, medications, and health status, including review of relevant consultants reports was performed today as part of a comprehensive evaluation and provision of chronic care management and care coordination services. ? ?SDOH (Social  Determinants of Health) assessments and interventions performed:   ? ?Advanced Directives Status: Not addressed in this encounter. ? ?Care Plan ?Conditions to be addressed/monitored: ; Level of care concerns ? ?Care Plan : LCSW Plan of Care  ?Updates made by Maurine Cane, LCSW since 11/07/2021 12:00 AM  ?  ? ?Problem: Quality of Life   ?  ? ?Goal: Quality of Life Maintained with Personal Care Services   ?Start Date: 10/24/2021  ?This Visit's Progress: On track  ?Recent Progress: On track  ?Priority: High  ?Note:   ?Current Barriers:  ?Level of Care Concerns:Inability to perform ADL's independently ?Lacks knowledge of how to connect  ? ?CSW Clinical Goal(s):  ?Patient  will patient will work with SW to address concerns related to ADL's  through collaboration with [place] representative, provider, and care team.  ? ?Interventions: ?1:1 collaboration with primary care provider regarding development and update of comprehensive plan of care as evidenced by provider attestation and co-signature ?Inter-disciplinary care team collaboration (see longitudinal plan of care) ?Evaluation of current treatment plan related to  self management and patient's adherence to plan as established by provider ?Review resources, discussed options and provided patient information about  ?Transportation provided by insurance provider (currently using) ?Enhanced Benefits connected with insurance provider:(currently using services) ?Personal Care Services [facility]) :(barrier with getting connected) ? ?Level of Care Concerns in a patient with CKD Stage , Stroke & Diabetes:  (Status: Goal on Track (progressing): YES.) ?Current level of care: home with other family or significant other(s): family member: brother ?Evaluation of patient's unmet needs in current living environment ?ADL's ?Assessed needs, level of care concerns, how currently meeting needs and barriers to care ?PCS referral faxed to [employer] at  [phone]  (11/01/21) ?[facility] referral received needs two corrections on dermographic, corrections made and re-faxed 11/07/2021 ? ?Task & activities to accomplish goals: ?Review personal care service information and provider list  ?Select 2 to 3 agencies you would like to use, keep this until your assessment is completed by [facility]. ?Return calls from [facility] or call them directly if you have questions [phone] or [phone]   ?You may call [facility] on or after 11/03/2021 to schedule your intake assessment ?  ?  ?Casimer Lanius, LCSW ?Licensed Clinical Social Worker [facility]  ?[place]  ?[phone]  ?

## 2021-11-08 DIAGNOSIS — D631 Anemia in chronic kidney disease: Secondary | ICD-10-CM | POA: Diagnosis not present

## 2021-11-08 DIAGNOSIS — N2581 Secondary hyperparathyroidism of renal origin: Secondary | ICD-10-CM | POA: Diagnosis not present

## 2021-11-08 DIAGNOSIS — N186 End stage renal disease: Secondary | ICD-10-CM | POA: Diagnosis not present

## 2021-11-09 ENCOUNTER — Telehealth: Payer: Self-pay | Admitting: *Deleted

## 2021-11-09 ENCOUNTER — Telehealth: Payer: Self-pay | Admitting: Internal Medicine

## 2021-11-09 DIAGNOSIS — N186 End stage renal disease: Secondary | ICD-10-CM | POA: Diagnosis not present

## 2021-11-09 DIAGNOSIS — N2581 Secondary hyperparathyroidism of renal origin: Secondary | ICD-10-CM | POA: Diagnosis not present

## 2021-11-09 DIAGNOSIS — D631 Anemia in chronic kidney disease: Secondary | ICD-10-CM | POA: Diagnosis not present

## 2021-11-09 MED ORDER — ATORVASTATIN CALCIUM 40 MG PO TABS: 40.0000 mg | ORAL_TABLET | Freq: Every day | ORAL | 1 refills | Status: DC

## 2021-11-09 NOTE — Telephone Encounter (Signed)
? ?  Telephone encounter was:  Unsuccessful.  11/09/2021 ?Name: Mary Valencia MRN: [id number] DOB: [birth year] ? ?Unsuccessful outbound call made today to assist with:  Food Insecurity ? ?Outreach Attempt:  3rd Attempt.  Referral closed unable to contact patient. ?[phone] ?Mary Valencia -Selinda Eon ?Care Guide , Embedded Care Coordination ?[place], Care Management  ?[phone] ?[address] , [place] [address] ?Email : [name] [email or web address] ?  ? ?

## 2021-11-09 NOTE — Telephone Encounter (Signed)
Called pt/Lori there was no answer LM[facility].Marland KitchenJohny Chess ?

## 2021-11-09 NOTE — Telephone Encounter (Signed)
Patient needs a script for prevastatin 40 mg. 1 tablet daily - It needs to be sent to [facility] on [address] in [place]  ? ?Please let nurse co-ordinator know if this can be filled - please leave a detailed voice mail message. ?

## 2021-11-10 ENCOUNTER — Ambulatory Visit: Payer: Medicare Other | Admitting: *Deleted

## 2021-11-10 DIAGNOSIS — E1110 Type 2 diabetes mellitus with ketoacidosis without coma: Secondary | ICD-10-CM | POA: Diagnosis not present

## 2021-11-10 DIAGNOSIS — K219 Gastro-esophageal reflux disease without esophagitis: Secondary | ICD-10-CM | POA: Diagnosis not present

## 2021-11-10 DIAGNOSIS — Z992 Dependence on renal dialysis: Secondary | ICD-10-CM | POA: Diagnosis not present

## 2021-11-10 DIAGNOSIS — I2510 Atherosclerotic heart disease of native coronary artery without angina pectoris: Secondary | ICD-10-CM | POA: Diagnosis not present

## 2021-11-10 DIAGNOSIS — Z794 Long term (current) use of insulin: Secondary | ICD-10-CM | POA: Diagnosis not present

## 2021-11-10 DIAGNOSIS — Z7982 Long term (current) use of aspirin: Secondary | ICD-10-CM | POA: Diagnosis not present

## 2021-11-10 DIAGNOSIS — E1122 Type 2 diabetes mellitus with diabetic chronic kidney disease: Secondary | ICD-10-CM | POA: Diagnosis not present

## 2021-11-10 DIAGNOSIS — Z8616 Personal history of COVID-19: Secondary | ICD-10-CM | POA: Diagnosis not present

## 2021-11-10 DIAGNOSIS — N186 End stage renal disease: Secondary | ICD-10-CM | POA: Diagnosis not present

## 2021-11-10 DIAGNOSIS — M25511 Pain in right shoulder: Secondary | ICD-10-CM | POA: Diagnosis not present

## 2021-11-10 DIAGNOSIS — G8929 Other chronic pain: Secondary | ICD-10-CM | POA: Diagnosis not present

## 2021-11-10 DIAGNOSIS — Z905 Acquired absence of kidney: Secondary | ICD-10-CM | POA: Diagnosis not present

## 2021-11-10 DIAGNOSIS — Z993 Dependence on wheelchair: Secondary | ICD-10-CM | POA: Diagnosis not present

## 2021-11-10 DIAGNOSIS — E785 Hyperlipidemia, unspecified: Secondary | ICD-10-CM | POA: Diagnosis not present

## 2021-11-10 DIAGNOSIS — E1140 Type 2 diabetes mellitus with diabetic neuropathy, unspecified: Secondary | ICD-10-CM | POA: Diagnosis not present

## 2021-11-10 DIAGNOSIS — E118 Type 2 diabetes mellitus with unspecified complications: Secondary | ICD-10-CM

## 2021-11-10 DIAGNOSIS — Z951 Presence of aortocoronary bypass graft: Secondary | ICD-10-CM | POA: Diagnosis not present

## 2021-11-10 DIAGNOSIS — Z9181 History of falling: Secondary | ICD-10-CM | POA: Diagnosis not present

## 2021-11-10 DIAGNOSIS — I129 Hypertensive chronic kidney disease with stage 1 through stage 4 chronic kidney disease, or unspecified chronic kidney disease: Secondary | ICD-10-CM | POA: Diagnosis not present

## 2021-11-10 DIAGNOSIS — N2581 Secondary hyperparathyroidism of renal origin: Secondary | ICD-10-CM | POA: Diagnosis not present

## 2021-11-10 DIAGNOSIS — E46 Unspecified protein-calorie malnutrition: Secondary | ICD-10-CM | POA: Diagnosis not present

## 2021-11-10 DIAGNOSIS — Z8673 Personal history of transient ischemic attack (TIA), and cerebral infarction without residual deficits: Secondary | ICD-10-CM | POA: Diagnosis not present

## 2021-11-10 DIAGNOSIS — D631 Anemia in chronic kidney disease: Secondary | ICD-10-CM | POA: Diagnosis not present

## 2021-11-10 DIAGNOSIS — Z9089 Acquired absence of other organs: Secondary | ICD-10-CM | POA: Diagnosis not present

## 2021-11-10 DIAGNOSIS — E559 Vitamin D deficiency, unspecified: Secondary | ICD-10-CM | POA: Diagnosis not present

## 2021-11-10 NOTE — Telephone Encounter (Signed)
Cecille Rubin called back to make sure that it was ok for the atorvastatin because patient use to be on prevastatin.  Please call back and let her know.  She states that you can leave a detailed message if you can reach her

## 2021-11-10 NOTE — Chronic Care Management (AMB) (Signed)
Care Management    RN Visit Note  11/10/2021 Name: Mary Valencia MRN: [id number] DOB: 11/28/1955  Subjective: Mary Valencia is a 66 y.o. year old female who is a primary care Mary of Plotnikov, Evie Lacks, MD. The care management team was consulted for assistance with disease management and care coordination needs.    Engaged with Mary by telephone for  acute/ unscheduled call  in response to provider referral for case management and/or care coordination services.   Consent to Services:   Mary Valencia was given information about Care Management services 10/13/21 including:  Care Management services includes personalized support from designated clinical staff supervised by her physician, including individualized plan of care and coordination with other care providers 24/7 contact phone numbers for assistance for urgent and routine care needs. The Mary may stop case management services at any time by phone call to the office staff.  Mary agreed to services and consent obtained.   Assessment: Review of Mary past medical history, allergies, medications, health status, including review of consultants reports, laboratory and other test data, was performed as part of comprehensive evaluation and provision of chronic care management services.  Care Plan  Allergies  Allergen Reactions   Diltiazem Swelling    EDEMA    Iodinated Contrast Media Hives   Betadine [Povidone Iodine] Other (See Comments)    Pt says it burns   Clonidine Derivatives     Dropped BP   Gabapentin     A bad reaction - confused   Iodine Hives   Penicillins Swelling   Sulfasalazine Hives   Sulfonamide Derivatives Hives   Tetracyclines & Related Nausea And Vomiting   Ceftriaxone Itching   Ciprofloxacin Itching   Naproxen Rash   Outpatient Encounter Medications as of 11/10/2021  Medication Sig Note   Alcohol Swabs (B-D SINGLE USE SWABS REGULAR) PADS Use as directed to clean site to check blood  sugars    atorvastatin (LIPITOR) 40 MG tablet Take 1 tablet (40 mg total) by mouth daily.    calcitRIOL (ROCALTROL) 0.5 MCG capsule Take by mouth. TAKE 1 BY MOUTH DAILY    Continuous Blood Gluc Receiver (FREESTYLE LIBRE 14 DAY READER) DEVI 1 Units by Does not apply route daily.    Continuous Blood Gluc Sensor (FREESTYLE LIBRE 14 DAY SENSOR) MISC 1 Units by Does not apply route every 14 (fourteen) days.    diphenhydrAMINE (BENADRYL) 25 mg capsule Take 25 mg by mouth as needed. 10/06/2015: Received from: [place]: Take 25 mg by mouth.   glucose blood test strip TEST BLOOD GLUCOSE THREE TIMES DAILY    hydrALAZINE (APRESOLINE) 50 MG tablet TAKE 1 BY  MOUTH TWICE A DAY    insulin NPH Human (NOVOLIN N) 100 UNIT/ML injection Inject into the skin. Inject 8 Units into the skin 2 times daily with meals    losartan (COZAAR) 50 MG tablet Take 1 tablet (50 mg total) by mouth daily. (Mary taking differently: Take 50 mg by mouth in the morning and at bedtime.)    mupirocin cream (BACTROBAN) 2 % Apply 1 application topically daily.    NIFEdipine (PROCARDIA XL/NIFEDICAL-XL) 90 MG 24 hr tablet     omeprazole (PRILOSEC) 40 MG capsule TAKE 1 CAPSULE EVERY DAY    polyethylene glycol (MIRALAX / GLYCOLAX) packet Take 17 g by mouth.    PRESCRIPTION MEDICATION 210 mg 3 (three) times daily before meals.    senna-docusate (SENOKOT-S) 8.6-50 MG tablet Take by mouth 2 (two)  times daily. Take 2 tab twice daily.    VELPHORO 500 MG chewable tablet CHEW 1 THREE TIMES A DAY WITH MEALS    No facility-administered encounter medications on file as of 11/10/2021.   Mary Active Problem List   Diagnosis Date Noted   Decreased functional mobility and endurance 09/20/2021   ESRD on hemodialysis ([place]) 09/20/2021   Hx of transient ischemic attack (TIA) 09/20/2021   Pressure ulcer of coccygeal region, stage 1 08/06/2021   Grieving 10/12/2020   Shoulder pain, right 10/11/2020   Hematoma and contusion  08/23/2020   Intertrigo 04/06/2020   Pressure sore 04/06/2020   Acute respiratory failure due to COVID-19 ([place]) 04/05/2020   Foot ulcer ([place]) 12/24/2019   Ankle sprain 12/24/2019   Hypomagnesemia 10/12/2019   History of cardioembolic cerebrovascular accident (CVA) 09/22/2019   Daytime somnolence 09/09/2019   Laceration of right lower leg 02/26/2019   History of colon polyps 11/12/2017   Liver cirrhosis ([place]) 09/21/2017   CAD (coronary artery disease) 09/21/2017   Arthritis 05/21/2017   Blurred vision, left eye 05/21/2017   Glomerulonephritis 05/21/2017   H/O unilateral nephrectomy 05/21/2017   Hx of gastric ulcer 05/21/2017   Neuropathy of both feet 05/21/2017   Situational anxiety 05/21/2017   Positive cardiac stress test 02/22/2017   Pre-transplant evaluation for kidney transplant 12/25/2016   Type 2 diabetes mellitus with hyperosmolar nonketotic hyperglycemia ([place]) 12/19/2016   UTI (urinary tract infection) 12/19/2016   Anemia 11/01/2016   Diarrhea 11/01/2016   Volume overload 03/31/2016   AKI (acute kidney injury) ([place]) 03/14/2016   ESRD (end stage renal disease) on dialysis ([place]) 10/06/2015   Situational mixed anxiety and depressive disorder 10/06/2015   Iron deficiency anemia 10/03/2015   Acquired solitary kidney 89/37/3428   Metabolic bone disease 76/81/1572   Vitamin D deficiency 09/03/2015   Hyperkalemia 02/01/2013   Edema 10/27/2012   TIA (transient ischemic attack) 03/21/2012   HLD (hyperlipidemia) 02/16/2012   Encephalopathy acute 01/29/2012   Cerebral infarction, unspecified ([place]) 01/25/2012   Weight loss 01/24/2012   LBP (low back pain) 01/24/2012   Chest pain, atypical 09/25/2011   Vagina bleeding 01/18/2011   ALLERGIC RHINITIS 09/05/2010   TACHYCARDIA 09/05/2010   DYSPNEA 09/05/2010   Diabetes mellitus type 2 with complications ([place]) 62/08/5595   Dyslipidemia 02/19/2007   Renal hypertension 02/19/2007   GERD 02/19/2007   Essential hypertension  02/19/2007   Conditions to be addressed/monitored:   DMII and ESRD- on peritoneal dialysis  Care Plan : RN Care Manager Plan of Care  Updates made by Knox Royalty, RN since 11/10/2021 12:00 AM     Problem: Chronic Disease Management Needs   Priority: High     Long-Range Goal: Ongoing adherence to established plan of care for long term chronic disease management   Start Date: 10/25/2021  Expected End Date: 10/26/2022  Priority: High  Note:   Current Barriers:  Chronic Disease Management support and education needs related to DMII and ESRD Need for assistance with ADL's and iADL's-- Clinic CSW currently active and assisting Mary-- she would like to have personal care services initiated Moderate food insecurity: community resource care guide referral placed Need for clinic pharmacy support with medication optimization; assistance with CGM supplies: Clinic pharmacy referral placed Recent unplanned hospitalization [facility]) May 11-15, 2023 for N/V, DKA; CAP: discharged home to self-care with home health services through [facility]):  Mary will demonstrate ongoing health management independence as evidenced by adherence to plan of care for DMII  and ESRD- on peritoneal dialysis        through collaboration with RN Care manager, provider, and care team.   Interventions: 1:1 collaboration with primary care provider regarding development and update of comprehensive plan of care as evidenced by provider attestation and co-signature Inter-disciplinary care team collaboration (see longitudinal plan of care) Evaluation of current treatment plan related to  self management and Mary's adherence to plan as established by provider Review of Mary status, including review of consultants reports, relevant laboratory and other test results, and medications completed 11/10/21: Acute/ Unscheduled incoming call received from Mary; left voice message requesting call-back;  returned Mary's call as requested Mary reports she was told to call [facility] to arrange her medicaid PCS assessment; states she needs the phone numbers- was recently hospitalized and misplaced phone numbers that were previously provided Reviewed recent outreach by clinic CSW on 11/07/21; provided phone numbers to [facility] as per [place] note; also provided [place] phone number, should Mary have ongoing questions- explained that [facility] will best able to answer any questions she may have around getting her PCS in place Reviewed recent hospitalization 5/11-15, 2023 at [facility]: she reports "doing much better;" states she was diagnosed with increased blood sugar and CAP; confirms that home health services were ordered with [facility]- reports "they came out today and did their assessment; they will start visiting me at home next week on Monday;" encouraged her full and ongoing engagement with home health team; today, she denies post-hospital discharge clinical concerns Completed RN CM initial assessment today Noted through review of EHR that [facility] has not been able to reach Mary to follow up on food resources/ status of Meals on Wheels-- provided care team phone number and encouraged Mary to contact care guide for follow up: she states she will do Reviewed upcoming provider PCP post-hospital office appointment 11/29/21- she verbalizes desire to re-schedule my next appointment (currently on same day): this was completed per Mary request  From 10/25/21 initial outreach: SDOH assessment updated: as above, concern for moderate food insecurity identified- CR care guide referral placed accordingly Depression screening completed: no concerns identified Pain assessment updated: denies pain today Falls assessment updated: reports 2 recent falls x last 12 months- last fall October 2022; reports using motorized wheelchair and/ or walker as indicated, states "mostly" uses motorized  wheelchair;  positive reinforcement provided for no falls since October 2022-- with encouragement to continue efforts at fall prevention; education around fall risks/ prevention provided; Mary also tells me she has ensured that she is wearing proper footwear, which has helped greatly in fall prevention over the last few months Reviewed recent PCP post-office visit instructions from 09/20/21- she verbalizes a good understanding of same and denies questions Medications discussed: independently self-manages and denies current concerns/ issues/ questions around medications; endorses adherence to taking all medications as prescribed Tells me that she has had recent issues in making sure she has enough supplies for her CGM; she would like assistance Mary recalls that she had previously been active with clinic Pharmacist and apparently had difficulty maintaining contact with pharmacist due to a prior issue she had with her phone-- she would appreciate clinic pharmacy involvement being resumed: will place referral today Reviewed upcoming scheduled provider appointments: 11/07/21- clinic CSW; 12/28/21- bone density scan; 01/23/22- PCP ; Mary confirms is aware of all and has plans to attend as scheduled Discussed plans with Mary for ongoing care management follow up and provided Mary with direct contact information for care  management team    Diabetes/ ESRD with peritoneal dialysis:  (Status: 10/25/21: New goal.) Long Term Goal   Lab Results  Component Value Date   HGBA1C 12.6 (A) 06/16/2021   HGBA1C 12.6 06/16/2021   HGBA1C 12.6 (A) 06/16/2021   HGBA1C 12.6 (A) 06/16/2021    Provided education to Mary about basic DM disease process; Reviewed prescribed diet with Mary renal, carbohydrate modified, low sugar; heart healthy low salt; Counseled on importance of regular laboratory monitoring as prescribed;        Confirmed Mary using CGM monitor; she reports she is now out of sensors and has  contacted company to re-order; hoping to hear back "soon" Reviewed recent blood sugars at home: she reports consistent fasting and post-prandial blood sugar ranges between 175-195 with "rare" episodes low blood sugar- she verbalizes a good general understanding of action plan for hypoglycemia and confirms she is aware when she has signs/ symptoms of same Reviewed Mary's understanding of signs/ symptoms low blood sugars: she verbalizes good baseline understanding of same Confirmed Mary self-manages insulin and reports adherence to taking as prescribed Confirmed Mary tries to follow prescribed diet: she tells me she sees a nutritionist "every 2 weeks" at her dialysis center; she verbalizes a very good understanding of prescribed renal/ DMII diet and endorses adherence to same Confirmed Mary self-manages  peritoneal dialysis at home during night time sleeping hours: she reports has been managing dialysis for several years and verbalizes very good understanding of same Confirms Mary monitors blood pressures at home daily as part of her peritoneal dialysis routine: she reports general blood pressure ranges consistently between 138-150/60-68  Mary Goals/Self-Care Activities: As evidenced by review of EHR, collaboration with care team, and Mary reporting during CCM RN CM outreach,  Mary Valencia will: Take medications as prescribed Attend all scheduled provider appointments Call pharmacy for medication refills Call provider office for new concerns or questions Continue to check fasting (first thing in the morning, before eating) and then again 2-hours after eating blood sugars at home Monitor your blood sugars using your continuous glucose monitor anytime you feel that your blood sugars may be running lower or higher than they should Continue to follow heart healthy, low salt, low cholesterol, carbohydrate-modified, low sugar diet, and renal diet: continue maintaining contact with the  nutritionist at your dialysis center Continue monitoring your blood pressures at home daily around your normal routine when you administer your peritoneal dialysis      Plan:  Telephone follow up appointment with care management team member scheduled for:  Thursday, December 01, 2021 at 9:15 am The Mary has been provided with contact information for the care management team and has been advised to call with any health related questions or concerns  Oneta Rack, RN, BSN, [place] [phone]: direct office

## 2021-11-10 NOTE — Telephone Encounter (Signed)
MD is out of the office will forward to his desk to review. Per last visit MD stated to start back on Atorvastatin.Marland KitchenJohny Chess

## 2021-11-10 NOTE — Telephone Encounter (Signed)
Pt called in and needs sensors for [place] 2.   States MD sent script for Libre 14. Please send it again.   Please send to [facility]? Not listed in chart.

## 2021-11-11 DIAGNOSIS — E559 Vitamin D deficiency, unspecified: Secondary | ICD-10-CM | POA: Diagnosis not present

## 2021-11-11 DIAGNOSIS — Z9181 History of falling: Secondary | ICD-10-CM | POA: Diagnosis not present

## 2021-11-11 DIAGNOSIS — N186 End stage renal disease: Secondary | ICD-10-CM | POA: Diagnosis not present

## 2021-11-11 DIAGNOSIS — E1110 Type 2 diabetes mellitus with ketoacidosis without coma: Secondary | ICD-10-CM | POA: Diagnosis not present

## 2021-11-11 DIAGNOSIS — G8929 Other chronic pain: Secondary | ICD-10-CM | POA: Diagnosis not present

## 2021-11-11 DIAGNOSIS — M25511 Pain in right shoulder: Secondary | ICD-10-CM | POA: Diagnosis not present

## 2021-11-11 DIAGNOSIS — K219 Gastro-esophageal reflux disease without esophagitis: Secondary | ICD-10-CM | POA: Diagnosis not present

## 2021-11-11 DIAGNOSIS — Z7982 Long term (current) use of aspirin: Secondary | ICD-10-CM | POA: Diagnosis not present

## 2021-11-11 DIAGNOSIS — I129 Hypertensive chronic kidney disease with stage 1 through stage 4 chronic kidney disease, or unspecified chronic kidney disease: Secondary | ICD-10-CM | POA: Diagnosis not present

## 2021-11-11 DIAGNOSIS — Z8616 Personal history of COVID-19: Secondary | ICD-10-CM | POA: Diagnosis not present

## 2021-11-11 DIAGNOSIS — E785 Hyperlipidemia, unspecified: Secondary | ICD-10-CM | POA: Diagnosis not present

## 2021-11-11 DIAGNOSIS — E1140 Type 2 diabetes mellitus with diabetic neuropathy, unspecified: Secondary | ICD-10-CM | POA: Diagnosis not present

## 2021-11-11 DIAGNOSIS — Z905 Acquired absence of kidney: Secondary | ICD-10-CM | POA: Diagnosis not present

## 2021-11-11 DIAGNOSIS — N2581 Secondary hyperparathyroidism of renal origin: Secondary | ICD-10-CM | POA: Diagnosis not present

## 2021-11-11 DIAGNOSIS — E46 Unspecified protein-calorie malnutrition: Secondary | ICD-10-CM | POA: Diagnosis not present

## 2021-11-11 DIAGNOSIS — I2510 Atherosclerotic heart disease of native coronary artery without angina pectoris: Secondary | ICD-10-CM | POA: Diagnosis not present

## 2021-11-11 DIAGNOSIS — Z992 Dependence on renal dialysis: Secondary | ICD-10-CM | POA: Diagnosis not present

## 2021-11-11 DIAGNOSIS — Z794 Long term (current) use of insulin: Secondary | ICD-10-CM | POA: Diagnosis not present

## 2021-11-11 DIAGNOSIS — E1122 Type 2 diabetes mellitus with diabetic chronic kidney disease: Secondary | ICD-10-CM | POA: Diagnosis not present

## 2021-11-11 DIAGNOSIS — Z8673 Personal history of transient ischemic attack (TIA), and cerebral infarction without residual deficits: Secondary | ICD-10-CM | POA: Diagnosis not present

## 2021-11-11 DIAGNOSIS — D631 Anemia in chronic kidney disease: Secondary | ICD-10-CM | POA: Diagnosis not present

## 2021-11-11 DIAGNOSIS — Z993 Dependence on wheelchair: Secondary | ICD-10-CM | POA: Diagnosis not present

## 2021-11-11 DIAGNOSIS — Z9089 Acquired absence of other organs: Secondary | ICD-10-CM | POA: Diagnosis not present

## 2021-11-11 DIAGNOSIS — Z951 Presence of aortocoronary bypass graft: Secondary | ICD-10-CM | POA: Diagnosis not present

## 2021-11-11 NOTE — Telephone Encounter (Signed)
Both prescriptions are on the med list.  Please fax where he needs to be faxed or mail paper prescriptions to the patient.  Thank you

## 2021-11-12 DIAGNOSIS — N2581 Secondary hyperparathyroidism of renal origin: Secondary | ICD-10-CM | POA: Diagnosis not present

## 2021-11-12 DIAGNOSIS — N186 End stage renal disease: Secondary | ICD-10-CM | POA: Diagnosis not present

## 2021-11-12 DIAGNOSIS — D631 Anemia in chronic kidney disease: Secondary | ICD-10-CM | POA: Diagnosis not present

## 2021-11-13 DIAGNOSIS — D631 Anemia in chronic kidney disease: Secondary | ICD-10-CM | POA: Diagnosis not present

## 2021-11-13 DIAGNOSIS — N2581 Secondary hyperparathyroidism of renal origin: Secondary | ICD-10-CM | POA: Diagnosis not present

## 2021-11-13 DIAGNOSIS — N186 End stage renal disease: Secondary | ICD-10-CM | POA: Diagnosis not present

## 2021-11-14 ENCOUNTER — Telehealth: Payer: Self-pay | Admitting: *Deleted

## 2021-11-14 DIAGNOSIS — Z9089 Acquired absence of other organs: Secondary | ICD-10-CM | POA: Diagnosis not present

## 2021-11-14 DIAGNOSIS — I2510 Atherosclerotic heart disease of native coronary artery without angina pectoris: Secondary | ICD-10-CM | POA: Diagnosis not present

## 2021-11-14 DIAGNOSIS — Z794 Long term (current) use of insulin: Secondary | ICD-10-CM | POA: Diagnosis not present

## 2021-11-14 DIAGNOSIS — I129 Hypertensive chronic kidney disease with stage 1 through stage 4 chronic kidney disease, or unspecified chronic kidney disease: Secondary | ICD-10-CM | POA: Diagnosis not present

## 2021-11-14 DIAGNOSIS — D631 Anemia in chronic kidney disease: Secondary | ICD-10-CM | POA: Diagnosis not present

## 2021-11-14 DIAGNOSIS — Z992 Dependence on renal dialysis: Secondary | ICD-10-CM | POA: Diagnosis not present

## 2021-11-14 DIAGNOSIS — Z8673 Personal history of transient ischemic attack (TIA), and cerebral infarction without residual deficits: Secondary | ICD-10-CM | POA: Diagnosis not present

## 2021-11-14 DIAGNOSIS — Z905 Acquired absence of kidney: Secondary | ICD-10-CM | POA: Diagnosis not present

## 2021-11-14 DIAGNOSIS — Z951 Presence of aortocoronary bypass graft: Secondary | ICD-10-CM | POA: Diagnosis not present

## 2021-11-14 DIAGNOSIS — E785 Hyperlipidemia, unspecified: Secondary | ICD-10-CM | POA: Diagnosis not present

## 2021-11-14 DIAGNOSIS — E1122 Type 2 diabetes mellitus with diabetic chronic kidney disease: Secondary | ICD-10-CM | POA: Diagnosis not present

## 2021-11-14 DIAGNOSIS — E559 Vitamin D deficiency, unspecified: Secondary | ICD-10-CM | POA: Diagnosis not present

## 2021-11-14 DIAGNOSIS — Z9181 History of falling: Secondary | ICD-10-CM | POA: Diagnosis not present

## 2021-11-14 DIAGNOSIS — E1140 Type 2 diabetes mellitus with diabetic neuropathy, unspecified: Secondary | ICD-10-CM | POA: Diagnosis not present

## 2021-11-14 DIAGNOSIS — N186 End stage renal disease: Secondary | ICD-10-CM | POA: Diagnosis not present

## 2021-11-14 DIAGNOSIS — E46 Unspecified protein-calorie malnutrition: Secondary | ICD-10-CM | POA: Diagnosis not present

## 2021-11-14 DIAGNOSIS — N2581 Secondary hyperparathyroidism of renal origin: Secondary | ICD-10-CM | POA: Diagnosis not present

## 2021-11-14 DIAGNOSIS — M25511 Pain in right shoulder: Secondary | ICD-10-CM | POA: Diagnosis not present

## 2021-11-14 DIAGNOSIS — E1110 Type 2 diabetes mellitus with ketoacidosis without coma: Secondary | ICD-10-CM | POA: Diagnosis not present

## 2021-11-14 DIAGNOSIS — Z8616 Personal history of COVID-19: Secondary | ICD-10-CM | POA: Diagnosis not present

## 2021-11-14 DIAGNOSIS — G8929 Other chronic pain: Secondary | ICD-10-CM | POA: Diagnosis not present

## 2021-11-14 DIAGNOSIS — Z993 Dependence on wheelchair: Secondary | ICD-10-CM | POA: Diagnosis not present

## 2021-11-14 DIAGNOSIS — Z7982 Long term (current) use of aspirin: Secondary | ICD-10-CM | POA: Diagnosis not present

## 2021-11-14 DIAGNOSIS — K219 Gastro-esophageal reflux disease without esophagitis: Secondary | ICD-10-CM | POA: Diagnosis not present

## 2021-11-14 NOTE — Telephone Encounter (Signed)
   Telephone encounter was:  Unsuccessful.  11/14/2021 Name: Mary Valencia MRN: [id number] DOB: 09-04-1955  Unsuccessful outbound call made today to assist with:  Food Insecurity and mailbox full requested to call back to explain pt on waitlist will contact her when a spot opens up, [employer] need to be ordered per SW per hosp visit on 11/07/2021  Outreach Attempt:  3rd Attempt.  Referral closed unable to contact patient.  mailbox full requested to call back to explain pt on waitlist will contact her when a spot opens up, [employer] need to be ordered per SW per hosp visit on 11/07/2021   [place], Care Management  [phone] [address] , [place] [address] Email : [email or web address]

## 2021-11-15 DIAGNOSIS — N186 End stage renal disease: Secondary | ICD-10-CM | POA: Diagnosis not present

## 2021-11-15 DIAGNOSIS — D631 Anemia in chronic kidney disease: Secondary | ICD-10-CM | POA: Diagnosis not present

## 2021-11-15 DIAGNOSIS — N2581 Secondary hyperparathyroidism of renal origin: Secondary | ICD-10-CM | POA: Diagnosis not present

## 2021-11-16 ENCOUNTER — Ambulatory Visit: Payer: Medicare Other | Admitting: *Deleted

## 2021-11-16 DIAGNOSIS — E1122 Type 2 diabetes mellitus with diabetic chronic kidney disease: Secondary | ICD-10-CM | POA: Diagnosis not present

## 2021-11-16 DIAGNOSIS — Z992 Dependence on renal dialysis: Secondary | ICD-10-CM | POA: Diagnosis not present

## 2021-11-16 DIAGNOSIS — K219 Gastro-esophageal reflux disease without esophagitis: Secondary | ICD-10-CM | POA: Diagnosis not present

## 2021-11-16 DIAGNOSIS — E1140 Type 2 diabetes mellitus with diabetic neuropathy, unspecified: Secondary | ICD-10-CM | POA: Diagnosis not present

## 2021-11-16 DIAGNOSIS — N186 End stage renal disease: Secondary | ICD-10-CM | POA: Diagnosis not present

## 2021-11-16 DIAGNOSIS — N2581 Secondary hyperparathyroidism of renal origin: Secondary | ICD-10-CM | POA: Diagnosis not present

## 2021-11-16 DIAGNOSIS — Z993 Dependence on wheelchair: Secondary | ICD-10-CM | POA: Diagnosis not present

## 2021-11-16 DIAGNOSIS — Z7982 Long term (current) use of aspirin: Secondary | ICD-10-CM | POA: Diagnosis not present

## 2021-11-16 DIAGNOSIS — E1110 Type 2 diabetes mellitus with ketoacidosis without coma: Secondary | ICD-10-CM | POA: Diagnosis not present

## 2021-11-16 DIAGNOSIS — I129 Hypertensive chronic kidney disease with stage 1 through stage 4 chronic kidney disease, or unspecified chronic kidney disease: Secondary | ICD-10-CM | POA: Diagnosis not present

## 2021-11-16 DIAGNOSIS — I2510 Atherosclerotic heart disease of native coronary artery without angina pectoris: Secondary | ICD-10-CM | POA: Diagnosis not present

## 2021-11-16 DIAGNOSIS — G8929 Other chronic pain: Secondary | ICD-10-CM | POA: Diagnosis not present

## 2021-11-16 DIAGNOSIS — E785 Hyperlipidemia, unspecified: Secondary | ICD-10-CM | POA: Diagnosis not present

## 2021-11-16 DIAGNOSIS — D631 Anemia in chronic kidney disease: Secondary | ICD-10-CM | POA: Diagnosis not present

## 2021-11-16 DIAGNOSIS — Z9089 Acquired absence of other organs: Secondary | ICD-10-CM | POA: Diagnosis not present

## 2021-11-16 DIAGNOSIS — Z8673 Personal history of transient ischemic attack (TIA), and cerebral infarction without residual deficits: Secondary | ICD-10-CM | POA: Diagnosis not present

## 2021-11-16 DIAGNOSIS — Z794 Long term (current) use of insulin: Secondary | ICD-10-CM | POA: Diagnosis not present

## 2021-11-16 DIAGNOSIS — Z8616 Personal history of COVID-19: Secondary | ICD-10-CM | POA: Diagnosis not present

## 2021-11-16 DIAGNOSIS — Z905 Acquired absence of kidney: Secondary | ICD-10-CM | POA: Diagnosis not present

## 2021-11-16 DIAGNOSIS — E559 Vitamin D deficiency, unspecified: Secondary | ICD-10-CM | POA: Diagnosis not present

## 2021-11-16 DIAGNOSIS — Z951 Presence of aortocoronary bypass graft: Secondary | ICD-10-CM | POA: Diagnosis not present

## 2021-11-16 DIAGNOSIS — E118 Type 2 diabetes mellitus with unspecified complications: Secondary | ICD-10-CM

## 2021-11-16 DIAGNOSIS — E46 Unspecified protein-calorie malnutrition: Secondary | ICD-10-CM | POA: Diagnosis not present

## 2021-11-16 DIAGNOSIS — Z9181 History of falling: Secondary | ICD-10-CM | POA: Diagnosis not present

## 2021-11-16 DIAGNOSIS — M25511 Pain in right shoulder: Secondary | ICD-10-CM | POA: Diagnosis not present

## 2021-11-16 NOTE — Chronic Care Management (AMB) (Signed)
Care Management    RN Visit Note  11/16/2021 Name: Mary Valencia MRN: [id number] DOB: 10-18-1955  Subjective: Mary Valencia is a 66 y.o. year old female who is a primary care Mary of Plotnikov, Evie Lacks, MD. The care management team was consulted for assistance with disease management and care coordination needs.    Engaged with Mary by telephone for  acute/ unscheduled outreach  in response to provider referral for case management and/or care coordination services.   Consent to Services:   Mary Valencia was given information about Care Management services 10/13/21 including:  Care Management services includes personalized support from designated clinical staff supervised by her physician, including individualized plan of care and coordination with other care providers 24/7 contact phone numbers for assistance for urgent and routine care needs. The Mary may stop case management services at any time by phone call to the office staff.  Mary agreed to services and consent obtained.   Assessment: Review of Mary past medical history, allergies, medications, health status, including review of consultants reports, laboratory and other test data, was performed as part of comprehensive evaluation and provision of chronic care management services.  Care Plan  Allergies  Allergen Reactions   Diltiazem Swelling    EDEMA    Iodinated Contrast Media Hives   Betadine [Povidone Iodine] Other (See Comments)    Pt says it burns   Clonidine Derivatives     Dropped BP   Gabapentin     A bad reaction - confused   Iodine Hives   Penicillins Swelling   Sulfasalazine Hives   Sulfonamide Derivatives Hives   Tetracyclines & Related Nausea And Vomiting   Ceftriaxone Itching   Ciprofloxacin Itching   Naproxen Rash   Outpatient Encounter Medications as of 11/16/2021  Medication Sig Note   Alcohol Swabs (B-D SINGLE USE SWABS REGULAR) PADS Use as directed to clean site to check blood  sugars    atorvastatin (LIPITOR) 40 MG tablet Take 1 tablet (40 mg total) by mouth daily.    calcitRIOL (ROCALTROL) 0.5 MCG capsule Take by mouth. TAKE 1 BY MOUTH DAILY    Continuous Blood Gluc Receiver (FREESTYLE LIBRE 14 DAY READER) DEVI 1 Units by Does not apply route daily.    Continuous Blood Gluc Sensor (FREESTYLE LIBRE 14 DAY SENSOR) MISC 1 Units by Does not apply route every 14 (fourteen) days.    diphenhydrAMINE (BENADRYL) 25 mg capsule Take 25 mg by mouth as needed. 10/06/2015: Received from: [place]: Take 25 mg by mouth.   glucose blood test strip TEST BLOOD GLUCOSE THREE TIMES DAILY    hydrALAZINE (APRESOLINE) 50 MG tablet TAKE 1 BY  MOUTH TWICE A DAY    insulin NPH Human (NOVOLIN N) 100 UNIT/ML injection Inject into the skin. Inject 8 Units into the skin 2 times daily with meals    losartan (COZAAR) 50 MG tablet Take 1 tablet (50 mg total) by mouth daily. (Mary taking differently: Take 50 mg by mouth in the morning and at bedtime.)    mupirocin cream (BACTROBAN) 2 % Apply 1 application topically daily.    NIFEdipine (PROCARDIA XL/NIFEDICAL-XL) 90 MG 24 hr tablet     omeprazole (PRILOSEC) 40 MG capsule TAKE 1 CAPSULE EVERY DAY    polyethylene glycol (MIRALAX / GLYCOLAX) packet Take 17 g by mouth.    PRESCRIPTION MEDICATION 210 mg 3 (three) times daily before meals.    senna-docusate (SENOKOT-S) 8.6-50 MG tablet Take by mouth 2 (two)  times daily. Take 2 tab twice daily.    VELPHORO 500 MG chewable tablet CHEW 1 THREE TIMES A DAY WITH MEALS    No facility-administered encounter medications on file as of 11/16/2021.   Mary Active Problem List   Diagnosis Date Noted   Decreased functional mobility and endurance 09/20/2021   ESRD on hemodialysis ([place]) 09/20/2021   Hx of transient ischemic attack (TIA) 09/20/2021   Pressure ulcer of coccygeal region, stage 1 08/06/2021   Grieving 10/12/2020   Shoulder pain, right 10/11/2020   Hematoma and contusion  08/23/2020   Intertrigo 04/06/2020   Pressure sore 04/06/2020   Acute respiratory failure due to COVID-19 ([place]) 04/05/2020   Foot ulcer ([place]) 12/24/2019   Ankle sprain 12/24/2019   Hypomagnesemia 10/12/2019   History of cardioembolic cerebrovascular accident (CVA) 09/22/2019   Daytime somnolence 09/09/2019   Laceration of right lower leg 02/26/2019   History of colon polyps 11/12/2017   Liver cirrhosis ([place]) 09/21/2017   CAD (coronary artery disease) 09/21/2017   Arthritis 05/21/2017   Blurred vision, left eye 05/21/2017   Glomerulonephritis 05/21/2017   H/O unilateral nephrectomy 05/21/2017   Hx of gastric ulcer 05/21/2017   Neuropathy of both feet 05/21/2017   Situational anxiety 05/21/2017   Positive cardiac stress test 02/22/2017   Pre-transplant evaluation for kidney transplant 12/25/2016   Type 2 diabetes mellitus with hyperosmolar nonketotic hyperglycemia ([place]) 12/19/2016   UTI (urinary tract infection) 12/19/2016   Anemia 11/01/2016   Diarrhea 11/01/2016   Volume overload 03/31/2016   AKI (acute kidney injury) ([place]) 03/14/2016   ESRD (end stage renal disease) on dialysis ([place]) 10/06/2015   Situational mixed anxiety and depressive disorder 10/06/2015   Iron deficiency anemia 10/03/2015   Acquired solitary kidney 43/15/4008   Metabolic bone disease 67/61/9509   Vitamin D deficiency 09/03/2015   Hyperkalemia 02/01/2013   Edema 10/27/2012   TIA (transient ischemic attack) 03/21/2012   HLD (hyperlipidemia) 02/16/2012   Encephalopathy acute 01/29/2012   Cerebral infarction, unspecified ([place]) 01/25/2012   Weight loss 01/24/2012   LBP (low back pain) 01/24/2012   Chest pain, atypical 09/25/2011   Vagina bleeding 01/18/2011   ALLERGIC RHINITIS 09/05/2010   TACHYCARDIA 09/05/2010   DYSPNEA 09/05/2010   Diabetes mellitus type 2 with complications ([place]) 32/67/1245   Dyslipidemia 02/19/2007   Renal hypertension 02/19/2007   GERD 02/19/2007   Essential hypertension  02/19/2007   Conditions to be addressed/monitored: DMII and ESRD  Care Plan : RN Care Manager Plan of Care  Updates made by Knox Royalty, RN since 11/16/2021 12:00 AM     Problem: Chronic Disease Management Needs   Priority: High     Long-Range Goal: Ongoing adherence to established plan of care for long term chronic disease management   Start Date: 10/25/2021  Expected End Date: 10/26/2022  Priority: High  Note:   Current Barriers:  Chronic Disease Management support and education needs related to DMII and ESRD Need for assistance with ADL's and iADL's-- Clinic CSW currently active and assisting Mary-- Mary Valencia would like to have personal care services initiated Moderate food insecurity: community resource care guide referral placed Need for clinic pharmacy support with medication optimization; assistance with CGM supplies: Clinic pharmacy referral placed Recent unplanned hospitalization [facility]) May 11-15, 2023 for N/V, DKA; CAP: discharged home to self-care with home health services through [place]):  Mary will demonstrate ongoing health management independence as evidenced by adherence to plan of care for DMII and ESRD- on peritoneal dialysis  through collaboration with RN Care manager, provider, and care team.   Interventions: 1:1 collaboration with primary care provider regarding development and update of comprehensive plan of care as evidenced by provider attestation and co-signature Inter-disciplinary care team collaboration (see longitudinal plan of care) Evaluation of current treatment plan related to  self management and Mary's adherence to plan as established by provider Review of Mary status, including review of consultants reports, relevant laboratory and other test results, and medications completed 11/16/21: Acute/ Unscheduled outreach; Mary left voice message reporting ongoing issue with obtaining supplies for her Walker; Mary Valencia requested  call-back; returned Mary's call accordingly Reports Mary Valencia has contacted PCP office previously to obtain a prescription  for FSL-2 sensors: verified Mary's call in 11/09/21 outreach encounter to PCP office Confirms that her current pharmacy is "[facility];" Mary Valencia provides [facility] numbers for [facility] as: [phone]: Mary Valencia does not know fax number Confirms that Mary Valencia contacted this pharmacy herself and was told they have a current Rx for the wrong sensor (FSL-14)-- they need the correct Rx for the FSL-2, which is not on her current medication list Mary is asking if I can send Rx in- explained that I am unable to do this, am not a prescribing provider-- shared with Mary that I will make Dr. Alain Marion aware of this ongoing issue: Mary Valencia reports Mary Valencia has not had sensors "for a very long time, months now;" will copy PCP CMA, in an effort to expedite her getting the sensor supplies Mary Valencia needs Mary also states that Mary Valencia has not been taking her currently prescribed insulin except for once QD (ordered BID)-- I note Mary Valencia has PCP appointment 11/29/21 and encouraged her to discuss all of this with PCP- Mary Valencia verbalizes understanding and agreement with this plan  From 11/10/21 outreach: 11/10/21: Acute/ Unscheduled incoming call received from Mary; left voice message requesting call-back; returned Mary's call as requested Mary reports Mary Valencia was told to call [facility] to arrange her medicaid PCS assessment; states Mary Valencia needs the phone numbers- was recently hospitalized and misplaced phone numbers that were previously provided Reviewed recent outreach by clinic CSW on 11/07/21; provided phone numbers to [facility] as per CSW note; also provided [place] phone number, should Mary have ongoing questions- explained that [place] will best able to answer any questions Mary Valencia may have around getting her PCS in place Reviewed recent hospitalization 5/11-15, 2023 at [facility]: Mary Valencia reports "doing much better;"  states Mary Valencia was diagnosed with increased blood sugar and CAP; confirms that home health services were ordered with [facility]- reports "they came out today and did their assessment; they will start visiting me at home next week on Monday;" encouraged her full and ongoing engagement with home health team; today, Mary Valencia denies post-hospital discharge clinical concerns Completed RN CM initial assessment today Noted through review of EHR that [place] has not been able to reach Mary to follow up on food resources/ status of Meals on Wheels-- provided care team phone number and encouraged Mary to contact care guide for follow up: Mary Valencia states Mary Valencia will do Reviewed upcoming provider PCP post-hospital office appointment 11/29/21- Mary Valencia verbalizes desire to re-schedule my next appointment (currently on same day): this was completed per Mary request  From 10/25/21 initial outreach: SDOH assessment updated: as above, concern for moderate food insecurity identified- CR care guide referral placed accordingly Depression screening completed: no concerns identified Pain assessment updated: denies pain today Falls assessment updated: reports 2 recent falls x last 12 months- last fall October 2022; reports  using motorized wheelchair and/ or walker as indicated, states "mostly" uses motorized wheelchair;  positive reinforcement provided for no falls since October 2022-- with encouragement to continue efforts at fall prevention; education around fall risks/ prevention provided; Mary also tells me Mary Valencia has ensured that Mary Valencia is wearing proper footwear, which has helped greatly in fall prevention over the last few months Reviewed recent PCP post-office visit instructions from 09/20/21- Mary Valencia verbalizes a good understanding of same and denies questions Medications discussed: independently self-manages and denies current concerns/ issues/ questions around medications; endorses adherence to taking all medications as  prescribed Tells me that Mary Valencia has had recent issues in making sure Mary Valencia has enough supplies for her CGM; Mary Valencia would like assistance Mary recalls that Mary Valencia had previously been active with clinic Pharmacist and apparently had difficulty maintaining contact with pharmacist due to a prior issue Mary Valencia had with her phone-- Mary Valencia would appreciate clinic pharmacy involvement being resumed: will place referral today Reviewed upcoming scheduled provider appointments: 11/07/21- clinic CSW; 12/28/21- bone density scan; 01/23/22- PCP ; Mary confirms is aware of all and has plans to attend as scheduled Discussed plans with Mary for ongoing care management follow up and provided Mary with direct contact information for care management team    Diabetes/ ESRD with peritoneal dialysis:  (Status: 10/25/21: New goal.) Long Term Goal   Lab Results  Component Value Date   HGBA1C 12.6 (A) 06/16/2021   HGBA1C 12.6 06/16/2021   HGBA1C 12.6 (A) 06/16/2021   HGBA1C 12.6 (A) 06/16/2021    Provided education to Mary about basic DM disease process; Reviewed prescribed diet with Mary renal, carbohydrate modified, low sugar; heart healthy low salt; Counseled on importance of regular laboratory monitoring as prescribed;        Confirmed Mary using CGM monitor; Mary Valencia reports Mary Valencia is now out of sensors and has contacted company to re-order; hoping to hear back "soon" Reviewed recent blood sugars at home: Mary Valencia reports consistent fasting and post-prandial blood sugar ranges between 175-195 with "rare" episodes low blood sugar- Mary Valencia verbalizes a good general understanding of action plan for hypoglycemia and confirms Mary Valencia is aware when Mary Valencia has signs/ symptoms of same Reviewed Mary's understanding of signs/ symptoms low blood sugars: Mary Valencia verbalizes good baseline understanding of same Confirmed Mary self-manages insulin and reports adherence to taking as prescribed Confirmed Mary tries to follow prescribed diet: Mary Valencia tells  me Mary Valencia sees a nutritionist "every 2 weeks" at her dialysis center; Mary Valencia verbalizes a very good understanding of prescribed renal/ DMII diet and endorses adherence to same Confirmed Mary self-manages  peritoneal dialysis at home during night time sleeping hours: Mary Valencia reports has been managing dialysis for several years and verbalizes very good understanding of same Confirms Mary monitors blood pressures at home daily as part of her peritoneal dialysis routine: Mary Valencia reports general blood pressure ranges consistently between 138-150/60-68  Mary Goals/Self-Care Activities: As evidenced by review of EHR, collaboration with care team, and Mary reporting during CCM RN CM outreach,  Mary Valencia will: Take medications as prescribed Attend all scheduled provider appointments Call pharmacy for medication refills Call provider office for new concerns or questions Continue to check fasting (first thing in the morning, before eating) and then again 2-hours after eating blood sugars at home Monitor your blood sugars using your continuous glucose monitor anytime you feel that your blood sugars may be running lower or higher than they should Continue to follow heart healthy, low salt, low cholesterol, carbohydrate-modified, low sugar diet, and renal diet:  continue maintaining contact with the nutritionist at your dialysis center Continue monitoring your blood pressures at home daily around your normal routine when you administer your peritoneal dialysis      Plan:  Telephone follow up appointment with care management team member scheduled for: December 01, 2021 The Mary has been provided with contact information for the care management team and has been advised to call with any health related questions or concerns   Oneta Rack, RN, BSN, [place] [phone]: direct office

## 2021-11-17 DIAGNOSIS — N2581 Secondary hyperparathyroidism of renal origin: Secondary | ICD-10-CM | POA: Diagnosis not present

## 2021-11-17 DIAGNOSIS — Z951 Presence of aortocoronary bypass graft: Secondary | ICD-10-CM | POA: Diagnosis not present

## 2021-11-17 DIAGNOSIS — N186 End stage renal disease: Secondary | ICD-10-CM | POA: Diagnosis not present

## 2021-11-17 DIAGNOSIS — Z905 Acquired absence of kidney: Secondary | ICD-10-CM | POA: Diagnosis not present

## 2021-11-17 DIAGNOSIS — Z9181 History of falling: Secondary | ICD-10-CM | POA: Diagnosis not present

## 2021-11-17 DIAGNOSIS — Z992 Dependence on renal dialysis: Secondary | ICD-10-CM | POA: Diagnosis not present

## 2021-11-17 DIAGNOSIS — K219 Gastro-esophageal reflux disease without esophagitis: Secondary | ICD-10-CM | POA: Diagnosis not present

## 2021-11-17 DIAGNOSIS — E785 Hyperlipidemia, unspecified: Secondary | ICD-10-CM | POA: Diagnosis not present

## 2021-11-17 DIAGNOSIS — Z7982 Long term (current) use of aspirin: Secondary | ICD-10-CM | POA: Diagnosis not present

## 2021-11-17 DIAGNOSIS — Z8616 Personal history of COVID-19: Secondary | ICD-10-CM | POA: Diagnosis not present

## 2021-11-17 DIAGNOSIS — Z8673 Personal history of transient ischemic attack (TIA), and cerebral infarction without residual deficits: Secondary | ICD-10-CM | POA: Diagnosis not present

## 2021-11-17 DIAGNOSIS — E1110 Type 2 diabetes mellitus with ketoacidosis without coma: Secondary | ICD-10-CM | POA: Diagnosis not present

## 2021-11-17 DIAGNOSIS — D631 Anemia in chronic kidney disease: Secondary | ICD-10-CM | POA: Diagnosis not present

## 2021-11-17 DIAGNOSIS — Z794 Long term (current) use of insulin: Secondary | ICD-10-CM | POA: Diagnosis not present

## 2021-11-17 DIAGNOSIS — G8929 Other chronic pain: Secondary | ICD-10-CM | POA: Diagnosis not present

## 2021-11-17 DIAGNOSIS — I129 Hypertensive chronic kidney disease with stage 1 through stage 4 chronic kidney disease, or unspecified chronic kidney disease: Secondary | ICD-10-CM | POA: Diagnosis not present

## 2021-11-17 DIAGNOSIS — Z9089 Acquired absence of other organs: Secondary | ICD-10-CM | POA: Diagnosis not present

## 2021-11-17 DIAGNOSIS — E1140 Type 2 diabetes mellitus with diabetic neuropathy, unspecified: Secondary | ICD-10-CM | POA: Diagnosis not present

## 2021-11-17 DIAGNOSIS — I2510 Atherosclerotic heart disease of native coronary artery without angina pectoris: Secondary | ICD-10-CM | POA: Diagnosis not present

## 2021-11-17 DIAGNOSIS — E559 Vitamin D deficiency, unspecified: Secondary | ICD-10-CM | POA: Diagnosis not present

## 2021-11-17 DIAGNOSIS — E1122 Type 2 diabetes mellitus with diabetic chronic kidney disease: Secondary | ICD-10-CM | POA: Diagnosis not present

## 2021-11-17 DIAGNOSIS — M25511 Pain in right shoulder: Secondary | ICD-10-CM | POA: Diagnosis not present

## 2021-11-17 DIAGNOSIS — E46 Unspecified protein-calorie malnutrition: Secondary | ICD-10-CM | POA: Diagnosis not present

## 2021-11-17 DIAGNOSIS — Z993 Dependence on wheelchair: Secondary | ICD-10-CM | POA: Diagnosis not present

## 2021-11-18 ENCOUNTER — Telehealth: Payer: Self-pay | Admitting: *Deleted

## 2021-11-18 DIAGNOSIS — N2581 Secondary hyperparathyroidism of renal origin: Secondary | ICD-10-CM | POA: Diagnosis not present

## 2021-11-18 DIAGNOSIS — D631 Anemia in chronic kidney disease: Secondary | ICD-10-CM | POA: Diagnosis not present

## 2021-11-18 DIAGNOSIS — N186 End stage renal disease: Secondary | ICD-10-CM | POA: Diagnosis not present

## 2021-11-18 MED ORDER — FREESTYLE LIBRE 2 SENSOR MISC
3 refills | Status: DC
Start: 2021-11-18 — End: 2021-11-29

## 2021-11-18 NOTE — Telephone Encounter (Signed)
Sent sensor 2 freestyle to [facility]..Pt has apt w/ MD on 11/29/21../l,mb

## 2021-11-18 NOTE — Telephone Encounter (Signed)
-----   Message from Knox Royalty, RN sent at 11/16/2021  4:09 PM EDT ----- Hello all, Patient called me today about ongoing issues obtaining her needed Free-Style Libre 2 sensors; you were all involved in this (11/09/21 encounter, so copying you all) Epic shows [place]- 14 sensor: this is incorrect  Patient needs FSL-2 sensors, she has been out "for months" Requests that they be called in to her pharmacy [facility]: she provides phone numbers as: [phone]: she does not know fax number  she contacted this pharmacy herself and was told they have a current Rx for the wrong sensor (FSL-14)-- they need the correct Rx for the FSL-2, which is not on her current medication list  Please call [facility] and order the (correct) FSL-2 sensors--   patient has OV with Dr. Mamie Nick 11/29/21 and also wants to discuss her insulin then  Thanks, Oneta Rack, RN, BSN, [place] [phone]: direct office

## 2021-11-19 DIAGNOSIS — D631 Anemia in chronic kidney disease: Secondary | ICD-10-CM | POA: Diagnosis not present

## 2021-11-19 DIAGNOSIS — N2581 Secondary hyperparathyroidism of renal origin: Secondary | ICD-10-CM | POA: Diagnosis not present

## 2021-11-19 DIAGNOSIS — N186 End stage renal disease: Secondary | ICD-10-CM | POA: Diagnosis not present

## 2021-11-20 DIAGNOSIS — N186 End stage renal disease: Secondary | ICD-10-CM | POA: Diagnosis not present

## 2021-11-20 DIAGNOSIS — N2581 Secondary hyperparathyroidism of renal origin: Secondary | ICD-10-CM | POA: Diagnosis not present

## 2021-11-20 DIAGNOSIS — D631 Anemia in chronic kidney disease: Secondary | ICD-10-CM | POA: Diagnosis not present

## 2021-11-21 DIAGNOSIS — Z8616 Personal history of COVID-19: Secondary | ICD-10-CM | POA: Diagnosis not present

## 2021-11-21 DIAGNOSIS — Z993 Dependence on wheelchair: Secondary | ICD-10-CM | POA: Diagnosis not present

## 2021-11-21 DIAGNOSIS — E785 Hyperlipidemia, unspecified: Secondary | ICD-10-CM | POA: Diagnosis not present

## 2021-11-21 DIAGNOSIS — Z7982 Long term (current) use of aspirin: Secondary | ICD-10-CM | POA: Diagnosis not present

## 2021-11-21 DIAGNOSIS — I2510 Atherosclerotic heart disease of native coronary artery without angina pectoris: Secondary | ICD-10-CM | POA: Diagnosis not present

## 2021-11-21 DIAGNOSIS — Z794 Long term (current) use of insulin: Secondary | ICD-10-CM | POA: Diagnosis not present

## 2021-11-21 DIAGNOSIS — Z992 Dependence on renal dialysis: Secondary | ICD-10-CM | POA: Diagnosis not present

## 2021-11-21 DIAGNOSIS — I129 Hypertensive chronic kidney disease with stage 1 through stage 4 chronic kidney disease, or unspecified chronic kidney disease: Secondary | ICD-10-CM | POA: Diagnosis not present

## 2021-11-21 DIAGNOSIS — G8929 Other chronic pain: Secondary | ICD-10-CM | POA: Diagnosis not present

## 2021-11-21 DIAGNOSIS — Z905 Acquired absence of kidney: Secondary | ICD-10-CM | POA: Diagnosis not present

## 2021-11-21 DIAGNOSIS — Z9181 History of falling: Secondary | ICD-10-CM | POA: Diagnosis not present

## 2021-11-21 DIAGNOSIS — E46 Unspecified protein-calorie malnutrition: Secondary | ICD-10-CM | POA: Diagnosis not present

## 2021-11-21 DIAGNOSIS — N186 End stage renal disease: Secondary | ICD-10-CM | POA: Diagnosis not present

## 2021-11-21 DIAGNOSIS — E1140 Type 2 diabetes mellitus with diabetic neuropathy, unspecified: Secondary | ICD-10-CM | POA: Diagnosis not present

## 2021-11-21 DIAGNOSIS — N2581 Secondary hyperparathyroidism of renal origin: Secondary | ICD-10-CM | POA: Diagnosis not present

## 2021-11-21 DIAGNOSIS — E559 Vitamin D deficiency, unspecified: Secondary | ICD-10-CM | POA: Diagnosis not present

## 2021-11-21 DIAGNOSIS — K219 Gastro-esophageal reflux disease without esophagitis: Secondary | ICD-10-CM | POA: Diagnosis not present

## 2021-11-21 DIAGNOSIS — Z9089 Acquired absence of other organs: Secondary | ICD-10-CM | POA: Diagnosis not present

## 2021-11-21 DIAGNOSIS — M25511 Pain in right shoulder: Secondary | ICD-10-CM | POA: Diagnosis not present

## 2021-11-21 DIAGNOSIS — E1122 Type 2 diabetes mellitus with diabetic chronic kidney disease: Secondary | ICD-10-CM | POA: Diagnosis not present

## 2021-11-21 DIAGNOSIS — Z951 Presence of aortocoronary bypass graft: Secondary | ICD-10-CM | POA: Diagnosis not present

## 2021-11-21 DIAGNOSIS — D631 Anemia in chronic kidney disease: Secondary | ICD-10-CM | POA: Diagnosis not present

## 2021-11-21 DIAGNOSIS — Z8673 Personal history of transient ischemic attack (TIA), and cerebral infarction without residual deficits: Secondary | ICD-10-CM | POA: Diagnosis not present

## 2021-11-21 DIAGNOSIS — E1110 Type 2 diabetes mellitus with ketoacidosis without coma: Secondary | ICD-10-CM | POA: Diagnosis not present

## 2021-11-22 ENCOUNTER — Ambulatory Visit: Payer: Self-pay | Admitting: Licensed Clinical Social Worker

## 2021-11-22 DIAGNOSIS — N186 End stage renal disease: Secondary | ICD-10-CM

## 2021-11-22 DIAGNOSIS — E118 Type 2 diabetes mellitus with unspecified complications: Secondary | ICD-10-CM

## 2021-11-22 DIAGNOSIS — D631 Anemia in chronic kidney disease: Secondary | ICD-10-CM | POA: Diagnosis not present

## 2021-11-22 DIAGNOSIS — N2581 Secondary hyperparathyroidism of renal origin: Secondary | ICD-10-CM | POA: Diagnosis not present

## 2021-11-22 NOTE — Chronic Care Management (AMB) (Signed)
Care Management Clinical Social Work Note  11/22/2021 Name: Mary Valencia MRN: [id number] DOB: [birth year]  Mary Valencia is a 66 y.o. year old female who is a primary care patient of Plotnikov, Evie Lacks, MD.  The Care Management team was consulted for assistance with chronic disease management and coordination needs.  Summary: Patient was not interviewed or contacted during this encounter CCM LCSW collaborated with [facility]  to assist with meeting patient's needs.  Patient has home assessment with Ermelinda Das today (11/22/21) at 2:30 to determine if she qualifies for [place] .  See Care Plan below for interventions and patient self-care actives.  Follow up Plan:  follow up appointment with LCSW in July   Assessment: Review of patient past medical history, allergies, medications, and health status, including review of relevant consultants reports was performed today as part of a comprehensive evaluation and provision of chronic care management and care coordination services.  SDOH (Social Determinants of Health) assessments and interventions performed:     Care Plan Conditions to be addressed/monitored: ; Level of care concerns  Care Plan : LCSW Plan of Care  Updates made by Maurine Cane, LCSW since 11/22/2021 12:00 AM     Problem: Quality of Life      Goal: Quality of Life Maintained with [place]   Start Date: 10/24/2021  This Visit's Progress: On track  Recent Progress: On track  Priority: High  Note:   Current Barriers:  Level of Care Concerns:Inability to perform ADL's independently Lacks knowledge of how to connect   [place]):  Patient  will patient will work with SW to address concerns related to ADL's  through collaboration with [place] representative, provider, and care team.   Interventions: 1:1 collaboration with primary care provider regarding development and update of comprehensive plan of care as  evidenced by provider attestation and co-signature Inter-disciplinary care team collaboration (see longitudinal plan of care) Evaluation of current treatment plan related to  self management and patient's adherence to plan as established by provider Review resources, discussed options and provided patient information about  Transportation provided by insurance provider (currently using) Enhanced Benefits connected with insurance provider:(currently using services) Personal Care Services (PCS) :(barrier with getting connected)  Level of Care Concerns in a patient with CKD Stage , Stroke & Diabetes:  (Status: Goal on Track (progressing): YES.) Current level of care: home with other family or significant other(s): family member: brother Evaluation of patient's unmet needs in current living environment ADL's Assessed needs, level of care concerns, how currently meeting needs and barriers to care Collaborated with [facility] to verify application received/ processed.  Patient has home assessment today 11/22/21 at 2:30  Task & activities to accomplish goals: Review personal care service information and provider list  Select 2 to 3 agencies you would like to use, keep this until your assessment is completed by [facility]. Return calls from [facility] or call them directly if you have questions [phone] or [place], [place] Licensed Clinical Social Worker Dossie Arbour Management  [place]  [phone]

## 2021-11-22 NOTE — Patient Instructions (Addendum)
     Patient was not contacted during this encounter.  LCSW collaborated with [facility] to accomplish patient's care plan goal   Casimer Lanius, LCSW Licensed Clinical Social Worker Dossie Arbour Artist [place]  [phone]

## 2021-11-23 DIAGNOSIS — Z905 Acquired absence of kidney: Secondary | ICD-10-CM | POA: Diagnosis not present

## 2021-11-23 DIAGNOSIS — Z8673 Personal history of transient ischemic attack (TIA), and cerebral infarction without residual deficits: Secondary | ICD-10-CM | POA: Diagnosis not present

## 2021-11-23 DIAGNOSIS — I129 Hypertensive chronic kidney disease with stage 1 through stage 4 chronic kidney disease, or unspecified chronic kidney disease: Secondary | ICD-10-CM | POA: Diagnosis not present

## 2021-11-23 DIAGNOSIS — N2581 Secondary hyperparathyroidism of renal origin: Secondary | ICD-10-CM | POA: Diagnosis not present

## 2021-11-23 DIAGNOSIS — E559 Vitamin D deficiency, unspecified: Secondary | ICD-10-CM | POA: Diagnosis not present

## 2021-11-23 DIAGNOSIS — Z993 Dependence on wheelchair: Secondary | ICD-10-CM | POA: Diagnosis not present

## 2021-11-23 DIAGNOSIS — I2510 Atherosclerotic heart disease of native coronary artery without angina pectoris: Secondary | ICD-10-CM | POA: Diagnosis not present

## 2021-11-23 DIAGNOSIS — Z9089 Acquired absence of other organs: Secondary | ICD-10-CM | POA: Diagnosis not present

## 2021-11-23 DIAGNOSIS — Z992 Dependence on renal dialysis: Secondary | ICD-10-CM | POA: Diagnosis not present

## 2021-11-23 DIAGNOSIS — E46 Unspecified protein-calorie malnutrition: Secondary | ICD-10-CM | POA: Diagnosis not present

## 2021-11-23 DIAGNOSIS — Z7982 Long term (current) use of aspirin: Secondary | ICD-10-CM | POA: Diagnosis not present

## 2021-11-23 DIAGNOSIS — G8929 Other chronic pain: Secondary | ICD-10-CM | POA: Diagnosis not present

## 2021-11-23 DIAGNOSIS — Z8616 Personal history of COVID-19: Secondary | ICD-10-CM | POA: Diagnosis not present

## 2021-11-23 DIAGNOSIS — E785 Hyperlipidemia, unspecified: Secondary | ICD-10-CM | POA: Diagnosis not present

## 2021-11-23 DIAGNOSIS — Z951 Presence of aortocoronary bypass graft: Secondary | ICD-10-CM | POA: Diagnosis not present

## 2021-11-23 DIAGNOSIS — N186 End stage renal disease: Secondary | ICD-10-CM | POA: Diagnosis not present

## 2021-11-23 DIAGNOSIS — E1140 Type 2 diabetes mellitus with diabetic neuropathy, unspecified: Secondary | ICD-10-CM | POA: Diagnosis not present

## 2021-11-23 DIAGNOSIS — M25511 Pain in right shoulder: Secondary | ICD-10-CM | POA: Diagnosis not present

## 2021-11-23 DIAGNOSIS — Z9181 History of falling: Secondary | ICD-10-CM | POA: Diagnosis not present

## 2021-11-23 DIAGNOSIS — E1110 Type 2 diabetes mellitus with ketoacidosis without coma: Secondary | ICD-10-CM | POA: Diagnosis not present

## 2021-11-23 DIAGNOSIS — K219 Gastro-esophageal reflux disease without esophagitis: Secondary | ICD-10-CM | POA: Diagnosis not present

## 2021-11-23 DIAGNOSIS — Z794 Long term (current) use of insulin: Secondary | ICD-10-CM | POA: Diagnosis not present

## 2021-11-23 DIAGNOSIS — E1122 Type 2 diabetes mellitus with diabetic chronic kidney disease: Secondary | ICD-10-CM | POA: Diagnosis not present

## 2021-11-23 DIAGNOSIS — D631 Anemia in chronic kidney disease: Secondary | ICD-10-CM | POA: Diagnosis not present

## 2021-11-24 DIAGNOSIS — M25511 Pain in right shoulder: Secondary | ICD-10-CM | POA: Diagnosis not present

## 2021-11-24 DIAGNOSIS — N2581 Secondary hyperparathyroidism of renal origin: Secondary | ICD-10-CM | POA: Diagnosis not present

## 2021-11-24 DIAGNOSIS — E1122 Type 2 diabetes mellitus with diabetic chronic kidney disease: Secondary | ICD-10-CM | POA: Diagnosis not present

## 2021-11-24 DIAGNOSIS — Z992 Dependence on renal dialysis: Secondary | ICD-10-CM | POA: Diagnosis not present

## 2021-11-24 DIAGNOSIS — E46 Unspecified protein-calorie malnutrition: Secondary | ICD-10-CM | POA: Diagnosis not present

## 2021-11-24 DIAGNOSIS — Z951 Presence of aortocoronary bypass graft: Secondary | ICD-10-CM | POA: Diagnosis not present

## 2021-11-24 DIAGNOSIS — I129 Hypertensive chronic kidney disease with stage 1 through stage 4 chronic kidney disease, or unspecified chronic kidney disease: Secondary | ICD-10-CM | POA: Diagnosis not present

## 2021-11-24 DIAGNOSIS — N186 End stage renal disease: Secondary | ICD-10-CM | POA: Diagnosis not present

## 2021-11-24 DIAGNOSIS — D631 Anemia in chronic kidney disease: Secondary | ICD-10-CM | POA: Diagnosis not present

## 2021-11-24 DIAGNOSIS — E1110 Type 2 diabetes mellitus with ketoacidosis without coma: Secondary | ICD-10-CM | POA: Diagnosis not present

## 2021-11-24 DIAGNOSIS — K219 Gastro-esophageal reflux disease without esophagitis: Secondary | ICD-10-CM | POA: Diagnosis not present

## 2021-11-24 DIAGNOSIS — Z9181 History of falling: Secondary | ICD-10-CM | POA: Diagnosis not present

## 2021-11-24 DIAGNOSIS — Z794 Long term (current) use of insulin: Secondary | ICD-10-CM | POA: Diagnosis not present

## 2021-11-24 DIAGNOSIS — Z9089 Acquired absence of other organs: Secondary | ICD-10-CM | POA: Diagnosis not present

## 2021-11-24 DIAGNOSIS — E785 Hyperlipidemia, unspecified: Secondary | ICD-10-CM | POA: Diagnosis not present

## 2021-11-24 DIAGNOSIS — E1140 Type 2 diabetes mellitus with diabetic neuropathy, unspecified: Secondary | ICD-10-CM | POA: Diagnosis not present

## 2021-11-24 DIAGNOSIS — Z8673 Personal history of transient ischemic attack (TIA), and cerebral infarction without residual deficits: Secondary | ICD-10-CM | POA: Diagnosis not present

## 2021-11-24 DIAGNOSIS — E559 Vitamin D deficiency, unspecified: Secondary | ICD-10-CM | POA: Diagnosis not present

## 2021-11-24 DIAGNOSIS — G8929 Other chronic pain: Secondary | ICD-10-CM | POA: Diagnosis not present

## 2021-11-24 DIAGNOSIS — Z905 Acquired absence of kidney: Secondary | ICD-10-CM | POA: Diagnosis not present

## 2021-11-24 DIAGNOSIS — Z7982 Long term (current) use of aspirin: Secondary | ICD-10-CM | POA: Diagnosis not present

## 2021-11-24 DIAGNOSIS — Z8616 Personal history of COVID-19: Secondary | ICD-10-CM | POA: Diagnosis not present

## 2021-11-24 DIAGNOSIS — Z993 Dependence on wheelchair: Secondary | ICD-10-CM | POA: Diagnosis not present

## 2021-11-24 DIAGNOSIS — I2510 Atherosclerotic heart disease of native coronary artery without angina pectoris: Secondary | ICD-10-CM | POA: Diagnosis not present

## 2021-11-25 DIAGNOSIS — D631 Anemia in chronic kidney disease: Secondary | ICD-10-CM | POA: Diagnosis not present

## 2021-11-25 DIAGNOSIS — N186 End stage renal disease: Secondary | ICD-10-CM | POA: Diagnosis not present

## 2021-11-25 DIAGNOSIS — N2581 Secondary hyperparathyroidism of renal origin: Secondary | ICD-10-CM | POA: Diagnosis not present

## 2021-11-26 DIAGNOSIS — N2581 Secondary hyperparathyroidism of renal origin: Secondary | ICD-10-CM | POA: Diagnosis not present

## 2021-11-26 DIAGNOSIS — N186 End stage renal disease: Secondary | ICD-10-CM | POA: Diagnosis not present

## 2021-11-26 DIAGNOSIS — D631 Anemia in chronic kidney disease: Secondary | ICD-10-CM | POA: Diagnosis not present

## 2021-11-27 DIAGNOSIS — N2581 Secondary hyperparathyroidism of renal origin: Secondary | ICD-10-CM | POA: Diagnosis not present

## 2021-11-27 DIAGNOSIS — N186 End stage renal disease: Secondary | ICD-10-CM | POA: Diagnosis not present

## 2021-11-27 DIAGNOSIS — D631 Anemia in chronic kidney disease: Secondary | ICD-10-CM | POA: Diagnosis not present

## 2021-11-28 ENCOUNTER — Ambulatory Visit: Payer: Medicare Other | Admitting: *Deleted

## 2021-11-28 DIAGNOSIS — N186 End stage renal disease: Secondary | ICD-10-CM

## 2021-11-28 DIAGNOSIS — N2581 Secondary hyperparathyroidism of renal origin: Secondary | ICD-10-CM | POA: Diagnosis not present

## 2021-11-28 DIAGNOSIS — D631 Anemia in chronic kidney disease: Secondary | ICD-10-CM | POA: Diagnosis not present

## 2021-11-28 DIAGNOSIS — E118 Type 2 diabetes mellitus with unspecified complications: Secondary | ICD-10-CM

## 2021-11-28 NOTE — Chronic Care Management (AMB) (Signed)
Care Management    RN Visit Note  11/28/2021 Name: Mary Valencia MRN: [id number] DOB: 02/11/1956  Subjective: Mary Valencia is a 66 y.o. year old female who is a primary care patient of Plotnikov, Evie Lacks, MD. The care management team was consulted for assistance with disease management and care coordination needs.    Engaged with patient by telephone for  acute/ unscheduled outreach  in response to provider referral for case management and/or care coordination services.   Consent to Services:   Mary Valencia including:  Care Management services includes personalized support from designated clinical staff supervised by her physician, including individualized plan of care and coordination with other care providers 24/7 contact phone numbers for assistance for urgent and routine care needs. The patient may stop case management services at any time by phone call to the office staff.  Patient agreed to services and consent obtained.   Assessment: Review of patient past medical history, allergies, medications, health status, including review of consultants reports, laboratory and other test data, was performed as part of comprehensive evaluation and provision of chronic care management services.  Care Plan  Allergies  Allergen Reactions   Diltiazem Swelling    EDEMA    Iodinated Contrast Media Hives   Betadine [Povidone Iodine] Other (See Comments)    Pt says it burns   Clonidine Derivatives     Dropped BP   Gabapentin     A bad reaction - confused   Iodine Hives   Penicillins Swelling   Sulfasalazine Hives   Sulfonamide Derivatives Hives   Tetracyclines & Related Nausea And Vomiting   Ceftriaxone Itching   Ciprofloxacin Itching   Naproxen Rash   Outpatient Encounter Medications as of 11/28/2021  Medication Sig Note   Alcohol Swabs (B-D SINGLE USE SWABS REGULAR) PADS Use as directed to clean site to check blood  sugars    atorvastatin (LIPITOR) 40 MG tablet Take 1 tablet (40 mg total) by mouth daily.    calcitRIOL (ROCALTROL) 0.5 MCG capsule Take by mouth. TAKE 1 BY MOUTH DAILY    Continuous Blood Gluc Sensor (FREESTYLE LIBRE 2 SENSOR) MISC Use as directed to check blood sugars daily    diphenhydrAMINE (BENADRYL) 25 mg capsule Take 25 mg by mouth as needed. 10/06/2015: Received from: [facility]: Take 25 mg by mouth.   glucose blood test strip TEST BLOOD GLUCOSE THREE TIMES DAILY    hydrALAZINE (APRESOLINE) 50 MG tablet TAKE 1 BY  MOUTH TWICE A DAY    insulin NPH Human (NOVOLIN N) 100 UNIT/ML injection Inject into the skin. Inject 8 Units into the skin 2 times daily with meals    losartan (COZAAR) 50 MG tablet Take 1 tablet (50 mg total) by mouth daily. (Patient taking differently: Take 50 mg by mouth in the morning and at bedtime.)    mupirocin cream (BACTROBAN) 2 % Apply 1 application topically daily.    NIFEdipine (PROCARDIA XL/NIFEDICAL-XL) 90 MG 24 hr tablet     omeprazole (PRILOSEC) 40 MG capsule TAKE 1 CAPSULE EVERY DAY    polyethylene glycol (MIRALAX / GLYCOLAX) packet Take 17 g by mouth.    PRESCRIPTION MEDICATION 210 mg 3 (three) times daily before meals.    senna-docusate (SENOKOT-S) 8.6-50 MG tablet Take by mouth 2 (two) times daily. Take 2 tab twice daily.    VELPHORO 500 MG chewable tablet CHEW 1 THREE TIMES A DAY WITH MEALS  No facility-administered encounter medications on file as of 11/28/2021.   Patient Active Problem List   Diagnosis Date Noted   Decreased functional mobility and endurance 09/20/2021   ESRD on hemodialysis ([place]) 09/20/2021   Hx of transient ischemic attack (TIA) 09/20/2021   Pressure ulcer of coccygeal region, stage 1 08/06/2021   Grieving 10/12/2020   Shoulder pain, right 10/11/2020   Hematoma and contusion 08/23/2020   Intertrigo 04/06/2020   Pressure sore 04/06/2020   Acute respiratory failure due to COVID-19 ([place]) 04/05/2020   Foot ulcer  ([place]) 12/24/2019   Ankle sprain 12/24/2019   Hypomagnesemia 10/12/2019   History of cardioembolic cerebrovascular accident (CVA) 09/22/2019   Daytime somnolence 09/09/2019   Laceration of right lower leg 02/26/2019   History of colon polyps 11/12/2017   Liver cirrhosis ([place]) 09/21/2017   CAD (coronary artery disease) 09/21/2017   Arthritis 05/21/2017   Blurred vision, left eye 05/21/2017   Glomerulonephritis 05/21/2017   H/O unilateral nephrectomy 05/21/2017   Hx of gastric ulcer 05/21/2017   Neuropathy of both feet 05/21/2017   Situational anxiety 05/21/2017   Positive cardiac stress test 02/22/2017   Pre-transplant evaluation for kidney transplant 12/25/2016   Type 2 diabetes mellitus with hyperosmolar nonketotic hyperglycemia ([place]) 12/19/2016   UTI (urinary tract infection) 12/19/2016   Anemia 11/01/2016   Diarrhea 11/01/2016   Volume overload 03/31/2016   AKI (acute kidney injury) ([place]) 03/14/2016   ESRD (end stage renal disease) on dialysis ([place]) 10/06/2015   Situational mixed anxiety and depressive disorder 10/06/2015   Iron deficiency anemia 10/03/2015   Acquired solitary kidney 68/34/1962   Metabolic bone disease 22/97/9892   Vitamin D deficiency 09/03/2015   Hyperkalemia 02/01/2013   Edema 10/27/2012   TIA (transient ischemic attack) 03/21/2012   HLD (hyperlipidemia) 02/16/2012   Encephalopathy acute 01/29/2012   Cerebral infarction, unspecified ([place]) 01/25/2012   Weight loss 01/24/2012   LBP (low back pain) 01/24/2012   Chest pain, atypical 09/25/2011   Vagina bleeding 01/18/2011   ALLERGIC RHINITIS 09/05/2010   TACHYCARDIA 09/05/2010   DYSPNEA 09/05/2010   Diabetes mellitus type 2 with complications ([place]) 11/94/1740   Dyslipidemia 02/19/2007   Renal hypertension 02/19/2007   GERD 02/19/2007   Essential hypertension 02/19/2007   Conditions to be addressed/monitored: DMII and ESRD  Care Plan : RN Care Manager Plan of Care  Updates made by Knox Royalty,  RN since 11/28/2021 12:00 AM     Problem: Chronic Disease Management Needs   Priority: High     Long-Range Goal: Ongoing adherence to established plan of care for long term chronic disease management   Start Date: 10/25/2021  Expected End Date: 10/26/2022  Priority: High  Note:   Current Barriers:  Chronic Disease Management support and education needs related to DMII and ESRD Need for assistance with ADL's and iADL's-- Clinic CSW currently active and assisting patient-- she would like to have personal care services initiated Moderate food insecurity: community resource care guide referral placed Need for clinic pharmacy support with medication optimization; assistance with CGM supplies: Clinic pharmacy referral placed Recent unplanned hospitalization [facility]) May 11-15, 2023 for N/V, DKA; CAP: discharged home to self-care with home health services through [place]):  Patient will demonstrate ongoing health management independence as evidenced by adherence to plan of care for DMII and ESRD- on peritoneal dialysis        through collaboration with RN Care manager, provider, and care team.   Interventions: 1:1 collaboration with primary care provider  regarding development and update of comprehensive plan of care as evidenced by provider attestation and co-signature Inter-disciplinary care team collaboration (see longitudinal plan of care) Evaluation of current treatment plan related to  self management and patient's adherence to plan as established by provider Review of patient status, including review of consultants reports, relevant laboratory and other test results, and medications completed 11/28/21: Acute/ unscheduled outreach: received off-hours voice message form patient's son, Ciella Obi, requesting call-back to either he or patient: noted no DPR on file including son Jenny Reichmann; call placed to patient as requested in off-hours voice message Patient reports she would like  update on status of recently requested (Nov 16, 2021) Rx for [employer] 2 Updated patient that I had placed this request with PCP office staff as we discussed on 11/16/21 Updated patient that office staff documented on 11/18/21 that the Rx had been sent to [facility], on 11/18/21 Patient confirms she plans to attend tomorrow's scheduled office visit on 11/29/21- advised patient to contact [facility] today to confirm they have received Rx from 11/18/21- she will do; advised patient to discuss any problems with this ongoing Rx issue with PCP during tomorrow's scheduled office visit- as I had advised on 11/16/21- she will do; patient declines need for me to contact her son in follow up- she will update her son herself Made patient aware that there is no DPR form on file authorizing that we are allowed to speak with her son: current DPR from 2014 indicates patient provided consent for [place] to speak with herself and her husband (husband now deceased); advised patient to consider completing updated DPR on file when she visits with PCP tomorrow, if she wishes to add her son or anyone else to her DPR- she will consider and complete accordingly   From previous outreaches:  11/16/21: Acute/ Unscheduled outreach; patient left voice message reporting ongoing issue with obtaining supplies for her [place]; she requested call-back; returned patient's call accordingly Reports she has contacted PCP office previously to obtain a prescription  for FSL-2 sensors: verified patient's call in 11/09/21 outreach encounter to PCP office Confirms that her current pharmacy is "[facility];" she provides telephone numbers for [facility] as: [phone]- [phone]: she does not know fax number Confirms that she contacted this pharmacy herself and was told they have a current Rx for the wrong sensor (FSL-14)-- they need the correct Rx for the FSL-2, which is not on her current medication list Patient is asking if I can send Rx in-  explained that I am unable to do this, am not a prescribing provider-- shared with patient that I will make Dr. Alain Marion aware of this ongoing issue: she reports she has not had sensors "for a very long time, months now;" will copy PCP CMA, in an effort to expedite her getting the sensor supplies she needs Patient also states that she has not been taking her currently prescribed insulin except for once QD (ordered BID)-- I note she has PCP appointment 11/29/21 and encouraged her to discuss all of this with PCP- she verbalizes understanding and agreement with this plan  From 11/10/21 outreach: 11/10/21: Acute/ Unscheduled incoming call received from patient; left voice message requesting call-back; returned patient's call as requested Patient reports she was told to call [facility] to arrange her medicaid PCS assessment; states she needs the phone numbers- was recently hospitalized and misplaced phone numbers that were previously provided Reviewed recent outreach by clinic CSW on 11/07/21; provided phone numbers to [facility] as per  CSW note; also provided CSW phone number, should patient have ongoing questions- explained that CSW will best able to answer any questions she may have around getting her PCS in place Reviewed recent hospitalization 5/11-15, 2023 at [facility]: she reports "doing much better;" states she was diagnosed with increased blood sugar and CAP; confirms that home health services were ordered with [facility]- reports "they came out today and did their assessment; they will start visiting me at home next week on Monday;" encouraged her full and ongoing engagement with home health team; today, she denies post-hospital discharge clinical concerns Completed RN CM initial assessment today Noted through review of EHR that Eugenio Saenz has not been able to reach patient to follow up on food resources/ status of Meals on Wheels-- provided care team phone number and encouraged  patient to contact care guide for follow up: she states she will do Reviewed upcoming provider PCP post-hospital office appointment 11/29/21- she verbalizes desire to re-schedule my next appointment (currently on same day): this was completed per patient request  From 10/25/21 initial outreach: SDOH assessment updated: as above, concern for moderate food insecurity identified- CR care guide referral placed accordingly Depression screening completed: no concerns identified Pain assessment updated: denies pain today Falls assessment updated: reports 2 recent falls x last 12 months- last fall October 2022; reports using motorized wheelchair and/ or walker as indicated, states "mostly" uses motorized wheelchair;  positive reinforcement provided for no falls since October 2022-- with encouragement to continue efforts at fall prevention; education around fall risks/ prevention provided; patient also tells me she has ensured that she is wearing proper footwear, which has helped greatly in fall prevention over the last few months Reviewed recent PCP post-office visit instructions from 09/20/21- she verbalizes a good understanding of same and denies questions Medications discussed: independently self-manages and denies current concerns/ issues/ questions around medications; endorses adherence to taking all medications as prescribed Tells me that she has had recent issues in making sure she has enough supplies for her CGM; she would like assistance Patient recalls that she had previously been active with clinic Pharmacist and apparently had difficulty maintaining contact with pharmacist due to a prior issue she had with her phone-- she would appreciate clinic pharmacy involvement being resumed: will place referral today Reviewed upcoming scheduled provider appointments: 11/07/21- clinic CSW; 12/28/21- bone density scan; 01/23/22- PCP ; patient confirms is aware of all and has plans to attend as scheduled Discussed plans  with patient for ongoing care management follow up and provided patient with direct contact information for care management team    Diabetes/ ESRD with peritoneal dialysis:  (Status: 10/25/21: New goal.) Long Term Goal   Lab Results  Component Value Date   HGBA1C 12.6 (A) 06/16/2021   HGBA1C 12.6 06/16/2021   HGBA1C 12.6 (A) 06/16/2021   HGBA1C 12.6 (A) 06/16/2021    Provided education to patient about basic DM disease process; Reviewed prescribed diet with patient renal, carbohydrate modified, low sugar; heart healthy low salt; Counseled on importance of regular laboratory monitoring as prescribed;        Confirmed patient using CGM monitor; she reports she is now out of sensors and has contacted company to re-order; hoping to hear back "soon" Reviewed recent blood sugars at home: she reports consistent fasting and post-prandial blood sugar ranges between 175-195 with "rare" episodes low blood sugar- she verbalizes a good general understanding of action plan for hypoglycemia and confirms she is aware when she has signs/  symptoms of same Reviewed patient's understanding of signs/ symptoms low blood sugars: she verbalizes good baseline understanding of same Confirmed patient self-manages insulin and reports adherence to taking as prescribed Confirmed patient tries to follow prescribed diet: she tells me she sees a nutritionist "every 2 weeks" at her dialysis center; she verbalizes a very good understanding of prescribed renal/ DMII diet and endorses adherence to same Confirmed patient self-manages  peritoneal dialysis at home during night time sleeping hours: she reports has been managing dialysis for several years and verbalizes very good understanding of same Confirms patient monitors blood pressures at home daily as part of her peritoneal dialysis routine: she reports general blood pressure ranges consistently between 138-150/60-68  Patient Goals/Self-Care Activities: As evidenced by review  of EHR, collaboration with care team, and patient reporting during CCM RN CM outreach,  Patient Caysie will: Take medications as prescribed Attend all scheduled provider appointments Call pharmacy for medication refills Call provider office for new concerns or questions Continue to check fasting (first thing in the morning, before eating) and then again 2-hours after eating blood sugars at home Monitor your blood sugars using your continuous glucose monitor anytime you feel that your blood sugars may be running lower or higher than they should Continue to follow heart healthy, low salt, low cholesterol, carbohydrate-modified, low sugar diet, and renal diet: continue maintaining contact with the nutritionist at your dialysis center Continue monitoring your blood pressures at home daily around your normal routine when you administer your peritoneal dialysis      Plan:  Telephone follow up appointment with care management team member scheduled for: Thursday, December 01, 2021 at 9:15 am The patient has been provided with contact information for the care management team and has been advised to call with any health related questions or concerns  Oneta Rack, RN, BSN, [place] [phone]: direct office

## 2021-11-29 ENCOUNTER — Other Ambulatory Visit: Payer: Self-pay | Admitting: *Deleted

## 2021-11-29 ENCOUNTER — Encounter: Payer: Self-pay | Admitting: Internal Medicine

## 2021-11-29 ENCOUNTER — Ambulatory Visit (INDEPENDENT_AMBULATORY_CARE_PROVIDER_SITE_OTHER): Payer: Medicare Other | Admitting: Internal Medicine

## 2021-11-29 ENCOUNTER — Telehealth: Payer: Medicare Other

## 2021-11-29 DIAGNOSIS — Z993 Dependence on wheelchair: Secondary | ICD-10-CM | POA: Diagnosis not present

## 2021-11-29 DIAGNOSIS — E1100 Type 2 diabetes mellitus with hyperosmolarity without nonketotic hyperglycemic-hyperosmolar coma (NKHHC): Secondary | ICD-10-CM | POA: Diagnosis not present

## 2021-11-29 DIAGNOSIS — K219 Gastro-esophageal reflux disease without esophagitis: Secondary | ICD-10-CM | POA: Diagnosis not present

## 2021-11-29 DIAGNOSIS — D631 Anemia in chronic kidney disease: Secondary | ICD-10-CM | POA: Diagnosis not present

## 2021-11-29 DIAGNOSIS — N186 End stage renal disease: Secondary | ICD-10-CM | POA: Diagnosis not present

## 2021-11-29 DIAGNOSIS — Z992 Dependence on renal dialysis: Secondary | ICD-10-CM | POA: Diagnosis not present

## 2021-11-29 DIAGNOSIS — R634 Abnormal weight loss: Secondary | ICD-10-CM | POA: Diagnosis not present

## 2021-11-29 DIAGNOSIS — Z8616 Personal history of COVID-19: Secondary | ICD-10-CM | POA: Diagnosis not present

## 2021-11-29 DIAGNOSIS — E1140 Type 2 diabetes mellitus with diabetic neuropathy, unspecified: Secondary | ICD-10-CM | POA: Diagnosis not present

## 2021-11-29 DIAGNOSIS — Z905 Acquired absence of kidney: Secondary | ICD-10-CM | POA: Diagnosis not present

## 2021-11-29 DIAGNOSIS — Z9089 Acquired absence of other organs: Secondary | ICD-10-CM | POA: Diagnosis not present

## 2021-11-29 DIAGNOSIS — I129 Hypertensive chronic kidney disease with stage 1 through stage 4 chronic kidney disease, or unspecified chronic kidney disease: Secondary | ICD-10-CM | POA: Diagnosis not present

## 2021-11-29 DIAGNOSIS — Z951 Presence of aortocoronary bypass graft: Secondary | ICD-10-CM | POA: Diagnosis not present

## 2021-11-29 DIAGNOSIS — M25511 Pain in right shoulder: Secondary | ICD-10-CM | POA: Diagnosis not present

## 2021-11-29 DIAGNOSIS — E118 Type 2 diabetes mellitus with unspecified complications: Secondary | ICD-10-CM

## 2021-11-29 DIAGNOSIS — E1122 Type 2 diabetes mellitus with diabetic chronic kidney disease: Secondary | ICD-10-CM | POA: Diagnosis not present

## 2021-11-29 DIAGNOSIS — E785 Hyperlipidemia, unspecified: Secondary | ICD-10-CM | POA: Diagnosis not present

## 2021-11-29 DIAGNOSIS — Z8673 Personal history of transient ischemic attack (TIA), and cerebral infarction without residual deficits: Secondary | ICD-10-CM | POA: Diagnosis not present

## 2021-11-29 DIAGNOSIS — Z9181 History of falling: Secondary | ICD-10-CM | POA: Diagnosis not present

## 2021-11-29 DIAGNOSIS — E1110 Type 2 diabetes mellitus with ketoacidosis without coma: Secondary | ICD-10-CM | POA: Diagnosis not present

## 2021-11-29 DIAGNOSIS — I2510 Atherosclerotic heart disease of native coronary artery without angina pectoris: Secondary | ICD-10-CM | POA: Diagnosis not present

## 2021-11-29 DIAGNOSIS — E46 Unspecified protein-calorie malnutrition: Secondary | ICD-10-CM | POA: Diagnosis not present

## 2021-11-29 DIAGNOSIS — J189 Pneumonia, unspecified organism: Secondary | ICD-10-CM

## 2021-11-29 DIAGNOSIS — G8929 Other chronic pain: Secondary | ICD-10-CM | POA: Diagnosis not present

## 2021-11-29 DIAGNOSIS — E559 Vitamin D deficiency, unspecified: Secondary | ICD-10-CM | POA: Diagnosis not present

## 2021-11-29 DIAGNOSIS — Z794 Long term (current) use of insulin: Secondary | ICD-10-CM | POA: Diagnosis not present

## 2021-11-29 DIAGNOSIS — Z7982 Long term (current) use of aspirin: Secondary | ICD-10-CM | POA: Diagnosis not present

## 2021-11-29 DIAGNOSIS — N2581 Secondary hyperparathyroidism of renal origin: Secondary | ICD-10-CM | POA: Diagnosis not present

## 2021-11-29 MED ORDER — FREESTYLE LIBRE 2 SENSOR MISC: 3 refills | Status: DC

## 2021-11-29 NOTE — Assessment & Plan Note (Addendum)
Recent DKA Chart reviewed Libre 2 sensors Rx re-faxed Labs w/[facility] q 1 month

## 2021-11-29 NOTE — Progress Notes (Signed)
Subjective:  Patient ID: Mary Valencia, female    DOB: [birth year]  Age: 66 y.o. MRN: [id number]  CC: Hospital follow up (Has not received freestyle [place])   HPI Mary Valencia presents for DM, HTN, ESRD on PD    [name] was hospitalized to [place], then to [place] 2-3 wks ago for CAP, DKA  Labs w/HD Center q 1 month  Per hx:  "Admit date: 11/04/2021 Discharge date and time: 11/07/2021 Hospital LOS: 3 days  Active Hospital Problems  Diagnosis Date Noted POA   *DKA, type 2, not at goal (*) 11/04/2021 Yes   Hypoglycemia 11/05/2021 Unknown   ESRD (end stage renal disease) on dialysis (*) 92/42/6834 Not Applicable   Lactic acidosis 11/04/2021 Yes   Pneumonia 11/04/2021 Yes   HTN (hypertension) 02/16/2012 Yes   HLD (hyperlipidemia) 02/16/2012 Yes   DM (diabetes mellitus) (*) 02/16/2012 Yes   Resolved Hospital Problems  No resolved problems to display.   Current Discharge Medication List   DISCONTINUED medications   amLODIPine (NORVASC) 10 MG tablet   b complex vitamins tablet   benazepril (LOTENSIN) 40 MG tablet   Cholecalciferol (VITAMIN D-3) 5000 UNITS TABS   insulin glargine (LANTUS) 100 UNIT/ML injection   Linagliptin-Metformin HCl 2.5-500 MG TABS   methocarbamol (ROBAXIN) 500 MG tablet   metoprolol (LOPRESSOR) 50 MG tablet   mupirocin (BACTROBAN) 2 % cream   traMADol (ULTRAM) 50 MG tablet    NEW medications  Details  acetaminophen (TYLENOL) 325 mg tablet Take two tablets (650 mg dose) by mouth every 6 (six) hours as needed for up to 10 days. Start date: 11/07/2021, End date: 11/17/2021   cefdinir (OMNICEF) 300 mg capsule Take one capsule (300 mg dose) by mouth daily for 3 days. Start date: 11/08/2021, End date: 11/11/2021   guaiFENesin (SM TUSSIN MUCUS,ROBITUSSIN) 100 mg/5 mL SOLN Take 10 mLs (200 mg dose) by mouth every 4 (four) hours as needed. Start date: 11/07/2021   insulin NPH (NOVOLIN N) 100 units/mL injection Inject eight Units into the skin  2 (two) times daily with meals. Start date: 11/07/2021   loperamide (IMODIUM) 2 MG capsule Take one capsule (2 mg dose) by mouth 4 (four) times a day as needed for Diarrhea for up to 10 days. Start date: 11/07/2021, End date: 11/17/2021    CHANGED medications  Details  potassium chloride (K-TAB,KLOR-CON) 10 mEq CR tablet Take one tablet (10 mEq dose) by mouth daily. Start date: 11/08/2021    CONTINUED medications  Details  aspirin 81 MG tablet Take one tablet (81 mg dose) by mouth daily.   calcitRIOL (ROCALTROL) 0.5 mcg capsule Take one capsule (0.5 mcg dose) by mouth 3 (three) times a week. Monday, Tuesday, Wednesday   diphenhydrAMINE (BANOPHEN,BENADRYL) 25 mg capsule Take one capsule (25 mg dose) by mouth every 4 (four) hours as needed.   hydrALAzine HCl (APRESOLINE) 50 mg tablet Take one tablet (50 mg dose) by mouth 2 (two) times daily.   losartan potassium (COZAAR) 50 mg tablet Take one tablet (50 mg dose) by mouth 2 (two) times daily.   NIFEdipine 90 mg 24 hr tablet Take one tablet (90 mg dose) by mouth daily.   omeprazole (PRILOSEC) 40 mg capsule Take one capsule (40 mg dose) by mouth daily.   polyethylene glycol (MIRALAX,GAVILAX,CLEARLAX) 17 GM/SCOOP powder Take seventeen g by mouth daily as needed.   pravastatin (PRAVACHOL) 40 MG tablet Take one tablet (40 mg dose) by mouth daily.   !! Sucroferric Oxyhydroxide (VELPHORO) 500 MG CHEW  Chew 250 mg by mouth with snacks.   !! Sucroferric Oxyhydroxide 500 MG CHEW Chew one tablet by mouth 3 (three) times daily with meals.   !! UNABLE TO FIND Patient stated she receives a grey football shaped tablet from dialysis that starts with "c" that she takes at home. Patient is unsure of the name.   !! UNABLE TO FIND Take 2 each by mouth daily. Bsi5 supplement   !! - Potential duplicate medications found. Please discuss with provider.    Hospital Course   Indication for Admission:  Brief H and P: CC- Nausea and Vomiting   HPI-  Patient is a 66 year old female, with a PMH of DM type 2, HTN, HLD, and ESRD on Peritoneal Dialysis, who presents to the to the hospital for nausea and vomiting. Patient reported she has developed severe recurrent watery - non-bloody emesis for the past 2 days. Patient has stopped taking lantus 8 units BID due to not having a functional gluco meter. Patient stopped using her peritoneal dialysis due to malfunction of apparatus. Patient became very weak with worsening nausea and vomiting on 05/11, and family called EMS.    Patient denies fevers, chills, chest pain, shortness of breath, cough, wheezing, or abdominal pain.   Reviewed Dr. Aurther Loft IM PCP note for DM type 2 and ESRD follow up   EKG- NSR, Rate-59, QTC-427, NO ST elevation or depression    Hospital Course:  DKA:  Was initially at [facility] and was put on DKA Protocol. She was being transferred to [facility] as she is PD patient. Noted to have severe hypoglycemia in 20s 5/12 and Insulin protocol was put on hold. Notified by RN 5/13 that her FSBS was 400 now and her insulin has been on hold. Ordered 3 units of Humalog times one. She is on NPH 8 units BID at home. Seems to be a very brittle diabetic. Stopped Glucommander protocol and started Glucosurveillance, Lantus 3 units HS, and low dose SSI. Up titrated Lantus to 5 units HS. She reports she did not take her insulin for a few days as she was out of her sensors and couldn't find her brother's glucometer. Encouraged her to pick up a glucometer when she goes to [place] to get her medications. Will discharge her on her home regimen of NPH 8 units BID. Have asked [facility] to arrange for follow up with PCP within 7 days. Encouraged her to be compliant with medications. HH PT/OT/RN/Aid ordered.    ESRD on PD:  Managed during admission by Nephrology team.    Essential Hypertension:  Will resume home Hydralazine '50mg'$  BID. Initially held home Nifedipine and Losartan but both resumed when BP was up. Will  continue her home BP regimen of Hydralazine, Nifedipine, and Losartan.   Pneumonia: Likely Gram Positive/CAP Initiated on Rocephin/Azithomycin upon admission. She completed Azithromycin during admission. Had 4 days of Rocephin. Transition to Cefdinir daily times 3 days. PRN Guaifenesin. Not requiring oxygen. Afebrile.    Hyperlipidemia: Resumee Pravachol.    Hx of CVA: Resumed Statin and ASA.      Bedside Procedures  No orders found "  Outpatient Medications Prior to Visit  Medication Sig Dispense Refill   Alcohol Swabs (B-D SINGLE USE SWABS REGULAR) PADS Use as directed to clean site to check blood sugars 180 each 3   atorvastatin (LIPITOR) 40 MG tablet Take 1 tablet (40 mg total) by mouth daily. 90 tablet 1   calcitRIOL (ROCALTROL) 0.5 MCG capsule Take by mouth. TAKE 1  BY MOUTH DAILY     Continuous Blood Gluc Sensor (FREESTYLE LIBRE 2 SENSOR) MISC Use as directed to check blood sugars daily 3 each 3   diphenhydrAMINE (BENADRYL) 25 mg capsule Take 25 mg by mouth as needed.     glucose blood test strip TEST BLOOD GLUCOSE THREE TIMES DAILY 300 each 3   hydrALAZINE (APRESOLINE) 50 MG tablet TAKE 1 BY  MOUTH TWICE A DAY     insulin NPH Human (NOVOLIN N) 100 UNIT/ML injection Inject into the skin. Inject 8 Units into the skin 2 times daily with meals     losartan (COZAAR) 50 MG tablet Take 1 tablet (50 mg total) by mouth daily. (Patient taking differently: Take 50 mg by mouth in the morning and at bedtime.) 90 tablet 3   mupirocin cream (BACTROBAN) 2 % Apply 1 application topically daily. 100 g 3   NIFEdipine (PROCARDIA XL/NIFEDICAL-XL) 90 MG 24 hr tablet      omeprazole (PRILOSEC) 40 MG capsule TAKE 1 CAPSULE EVERY DAY 90 capsule 3   polyethylene glycol (MIRALAX / GLYCOLAX) packet Take 17 g by mouth.     PRESCRIPTION MEDICATION 210 mg 3 (three) times daily before meals.     senna-docusate (SENOKOT-S) 8.6-50 MG tablet Take by mouth 2 (two) times daily. Take 2 tab twice daily.      VELPHORO 500 MG chewable tablet CHEW 1 THREE TIMES A DAY WITH MEALS     No facility-administered medications prior to visit.    ROS: Review of Systems  Constitutional:  Positive for fatigue. Negative for activity change, appetite change, chills and unexpected weight change.  HENT:  Negative for congestion, mouth sores and sinus pressure.   Eyes:  Negative for visual disturbance.  Respiratory:  Negative for cough and chest tightness.   Gastrointestinal:  Negative for abdominal pain and nausea.  Genitourinary:  Negative for difficulty urinating, frequency and vaginal pain.  Musculoskeletal:  Positive for arthralgias. Negative for back pain and gait problem.  Skin:  Negative for pallor and rash.  Neurological:  Positive for weakness and light-headedness. Negative for dizziness, tremors, syncope, numbness and headaches.  Psychiatric/Behavioral:  Negative for confusion and sleep disturbance.    Objective:  BP 118/78 (BP Location: Left Arm, Patient Position: Sitting, Cuff Size: Normal)   Pulse 81   Temp 98.4 F (36.9 C) (Oral)   Ht '5\' 2"'$  (1.575 m)   Wt 128 lb (58.1 kg)   SpO2 93%   BMI 23.41 kg/m   BP Readings from Last 3 Encounters:  11/29/21 118/78  09/20/21 100/68  08/03/21 130/68    Wt Readings from Last 3 Encounters:  11/29/21 128 lb (58.1 kg)  09/20/21 138 lb (62.6 kg)  08/03/21 134 lb 12.8 oz (61.1 kg)    Physical Exam Constitutional:      General: She is not in acute distress.    Appearance: Normal appearance. She is well-developed.  HENT:     Head: Normocephalic.     Right Ear: External ear normal.     Left Ear: External ear normal.     Nose: Nose normal.  Eyes:     General:        Right eye: No discharge.        Left eye: No discharge.     Conjunctiva/sclera: Conjunctivae normal.     Pupils: Pupils are equal, round, and reactive to light.  Neck:     Thyroid: No thyromegaly.     Vascular: No JVD.  Trachea: No tracheal deviation.  Cardiovascular:      Rate and Rhythm: Normal rate and regular rhythm.     Heart sounds: Normal heart sounds.  Pulmonary:     Effort: No respiratory distress.     Breath sounds: No stridor. No wheezing.  Abdominal:     General: Bowel sounds are normal. There is no distension.     Palpations: Abdomen is soft. There is no mass.     Tenderness: There is no abdominal tenderness. There is no guarding or rebound.  Musculoskeletal:        General: No tenderness.     Cervical back: Normal range of motion and neck supple. No rigidity.  Lymphadenopathy:     Cervical: No cervical adenopathy.  Skin:    Findings: No erythema or rash.  Neurological:     Mental Status: She is oriented to person, place, and time.     Cranial Nerves: No cranial nerve deficit.     Motor: Weakness present. No abnormal muscle tone.     Coordination: Coordination abnormal.     Gait: Gait abnormal.     Deep Tendon Reflexes: Reflexes normal.  Psychiatric:        Behavior: Behavior normal.        Thought Content: Thought content normal.        Judgment: Judgment normal.  In a w/c In a power w/c  Lab Results  Component Value Date   WBC 7.7 08/29/2017   HGB 29.1 09/01/2017   HCT 30 (A) 08/29/2017   PLT 203 08/29/2017   GLUCOSE 279 (H) 09/21/2017   CHOL 161 09/21/2017   TRIG 72.0 09/21/2017   HDL 63.60 09/21/2017   LDLDIRECT 207.8 09/29/2010   LDLCALC 83 09/21/2017   ALT 6 09/21/2017   AST 21 09/21/2017   NA 141 09/21/2017   K 4.9 09/21/2017   CL 98 09/21/2017   CREATININE 13.49 (HH) 09/21/2017   BUN 66 (H) 09/21/2017   CO2 27 09/21/2017   TSH 2.81 09/21/2017   HGBA1C 12.6 (A) 06/16/2021   HGBA1C 12.6 06/16/2021   HGBA1C 12.6 (A) 06/16/2021   HGBA1C 12.6 (A) 06/16/2021    No results found.  Assessment & Plan:   Problem List Items Addressed This Visit     CAP (community acquired pneumonia)    CAP w/a DKA Off abx      Diabetes mellitus type 2 with complications ([place])    Recent DKA Chart reviewed Libre 2 sensors  Rx re-faxed Labs w/HD Center q 1 month      ESRD (end stage renal disease) on dialysis ([place])    10/2021 DKA On home PD Labs w/HD Center q 1 month      Type 2 diabetes mellitus with hyperosmolar nonketotic hyperglycemia ([place])    S/p  DKA Discussed      Weight loss    States doing better, good appetite          No orders of the defined types were placed in this encounter.     Follow-up: Return in about 3 months (around 03/01/2022) for a follow-up visit.  Walker Kehr, MD

## 2021-11-29 NOTE — Assessment & Plan Note (Signed)
S/p  DKA Discussed

## 2021-11-29 NOTE — Assessment & Plan Note (Signed)
CAP w/a DKA Off abx

## 2021-11-29 NOTE — Assessment & Plan Note (Addendum)
10/2021 DKA On home PD Labs w/HD Center q 1 month

## 2021-11-29 NOTE — Assessment & Plan Note (Signed)
States doing better, good appetite

## 2021-11-30 ENCOUNTER — Other Ambulatory Visit: Payer: Self-pay | Admitting: Internal Medicine

## 2021-11-30 DIAGNOSIS — E1122 Type 2 diabetes mellitus with diabetic chronic kidney disease: Secondary | ICD-10-CM | POA: Diagnosis not present

## 2021-11-30 DIAGNOSIS — E785 Hyperlipidemia, unspecified: Secondary | ICD-10-CM | POA: Diagnosis not present

## 2021-11-30 DIAGNOSIS — Z905 Acquired absence of kidney: Secondary | ICD-10-CM | POA: Diagnosis not present

## 2021-11-30 DIAGNOSIS — G8929 Other chronic pain: Secondary | ICD-10-CM | POA: Diagnosis not present

## 2021-11-30 DIAGNOSIS — Z992 Dependence on renal dialysis: Secondary | ICD-10-CM | POA: Diagnosis not present

## 2021-11-30 DIAGNOSIS — E46 Unspecified protein-calorie malnutrition: Secondary | ICD-10-CM | POA: Diagnosis not present

## 2021-11-30 DIAGNOSIS — I129 Hypertensive chronic kidney disease with stage 1 through stage 4 chronic kidney disease, or unspecified chronic kidney disease: Secondary | ICD-10-CM | POA: Diagnosis not present

## 2021-11-30 DIAGNOSIS — Z951 Presence of aortocoronary bypass graft: Secondary | ICD-10-CM | POA: Diagnosis not present

## 2021-11-30 DIAGNOSIS — N186 End stage renal disease: Secondary | ICD-10-CM | POA: Diagnosis not present

## 2021-11-30 DIAGNOSIS — K219 Gastro-esophageal reflux disease without esophagitis: Secondary | ICD-10-CM | POA: Diagnosis not present

## 2021-11-30 DIAGNOSIS — Z993 Dependence on wheelchair: Secondary | ICD-10-CM | POA: Diagnosis not present

## 2021-11-30 DIAGNOSIS — Z9181 History of falling: Secondary | ICD-10-CM | POA: Diagnosis not present

## 2021-11-30 DIAGNOSIS — Z9089 Acquired absence of other organs: Secondary | ICD-10-CM | POA: Diagnosis not present

## 2021-11-30 DIAGNOSIS — Z8673 Personal history of transient ischemic attack (TIA), and cerebral infarction without residual deficits: Secondary | ICD-10-CM | POA: Diagnosis not present

## 2021-11-30 DIAGNOSIS — Z7982 Long term (current) use of aspirin: Secondary | ICD-10-CM | POA: Diagnosis not present

## 2021-11-30 DIAGNOSIS — D631 Anemia in chronic kidney disease: Secondary | ICD-10-CM | POA: Diagnosis not present

## 2021-11-30 DIAGNOSIS — E1110 Type 2 diabetes mellitus with ketoacidosis without coma: Secondary | ICD-10-CM | POA: Diagnosis not present

## 2021-11-30 DIAGNOSIS — I2510 Atherosclerotic heart disease of native coronary artery without angina pectoris: Secondary | ICD-10-CM | POA: Diagnosis not present

## 2021-11-30 DIAGNOSIS — Z794 Long term (current) use of insulin: Secondary | ICD-10-CM | POA: Diagnosis not present

## 2021-11-30 DIAGNOSIS — Z8616 Personal history of COVID-19: Secondary | ICD-10-CM | POA: Diagnosis not present

## 2021-11-30 DIAGNOSIS — N2581 Secondary hyperparathyroidism of renal origin: Secondary | ICD-10-CM | POA: Diagnosis not present

## 2021-11-30 DIAGNOSIS — M25511 Pain in right shoulder: Secondary | ICD-10-CM | POA: Diagnosis not present

## 2021-11-30 DIAGNOSIS — E1140 Type 2 diabetes mellitus with diabetic neuropathy, unspecified: Secondary | ICD-10-CM | POA: Diagnosis not present

## 2021-11-30 DIAGNOSIS — E559 Vitamin D deficiency, unspecified: Secondary | ICD-10-CM | POA: Diagnosis not present

## 2021-12-01 ENCOUNTER — Telehealth: Payer: Self-pay | Admitting: Internal Medicine

## 2021-12-01 ENCOUNTER — Ambulatory Visit: Payer: Medicare Other | Admitting: *Deleted

## 2021-12-01 DIAGNOSIS — E118 Type 2 diabetes mellitus with unspecified complications: Secondary | ICD-10-CM

## 2021-12-01 DIAGNOSIS — N186 End stage renal disease: Secondary | ICD-10-CM

## 2021-12-01 DIAGNOSIS — D631 Anemia in chronic kidney disease: Secondary | ICD-10-CM | POA: Diagnosis not present

## 2021-12-01 DIAGNOSIS — N2581 Secondary hyperparathyroidism of renal origin: Secondary | ICD-10-CM | POA: Diagnosis not present

## 2021-12-01 MED ORDER — LOSARTAN POTASSIUM 100 MG PO TABS: 100.0000 mg | ORAL_TABLET | Freq: Two times a day (BID) | ORAL | 3 refills | Status: DC

## 2021-12-01 NOTE — Telephone Encounter (Signed)
Patients pharmacy is requesting that patients rx for losartan be changed from 2 '50mg'$  twice a day.  It needs to be 1 '100mg'$  twice a day due to insurance not paying for more than 2 pills a day.

## 2021-12-01 NOTE — Telephone Encounter (Signed)
Updated script sent Losartan 100 mg take 1 twice a day to [facility].Marland KitchenJohny Valencia

## 2021-12-01 NOTE — Chronic Care Management (AMB) (Signed)
Care Management    RN Visit Note  12/01/2021 Name: Mary Valencia MRN: [id number] DOB: [birth year]  Subjective: Mary Valencia is a 66 y.o. year old female who is a primary care patient of Plotnikov, Evie Lacks, MD. The care management team was consulted for assistance with disease management and care coordination needs.    Engaged with patient by telephone for follow up visit in response to provider referral for case management and/or care coordination services.   Consent to Services:   Ms. Willig was given information about Care Management services 10/13/21 including:  Care Management services includes personalized support from designated clinical staff supervised by her physician, including individualized plan of care and coordination with other care providers 24/7 contact phone numbers for assistance for urgent and routine care needs. The patient may stop case management services at any time by phone call to the office staff.  Patient agreed to services and consent obtained.   Assessment: Review of patient past medical history, allergies, medications, health status, including review of consultants reports, laboratory and other test data, was performed as part of comprehensive evaluation and provision of chronic care management services.  Care Plan  Allergies  Allergen Reactions   Diltiazem Swelling    EDEMA    Iodinated Contrast Media Hives   Betadine [Povidone Iodine] Other (See Comments)    Pt says it burns   Clonidine Derivatives     Dropped BP   Gabapentin     A bad reaction - confused   Iodine Hives   Penicillins Swelling   Sulfasalazine Hives   Sulfonamide Derivatives Hives   Tetracyclines & Related Nausea And Vomiting   Ceftriaxone Itching   Ciprofloxacin Itching   Naproxen Rash   Outpatient Encounter Medications as of 12/01/2021  Medication Sig Note   Alcohol Swabs (B-D SINGLE USE SWABS REGULAR) PADS Use as directed to clean site to check blood sugars     ASPIRIN LOW DOSE 81 MG tablet NEW PRESCRIPTION REQUEST: Aspirin 81 MG Tablet,delayed Release - TAKE ONE TABLET BY MOUTH DAILY    atorvastatin (LIPITOR) 40 MG tablet NEW PRESCRIPTION REQUEST: Atorvastatin 40 MG Tablet - TAKE ONE TABLET BY MOUTH DAILY    calcitRIOL (ROCALTROL) 0.5 MCG capsule Take by mouth. TAKE 1 BY MOUTH DAILY    Continuous Blood Gluc Sensor (FREESTYLE LIBRE 2 SENSOR) MISC Use as directed to check blood sugars daily    diphenhydrAMINE (BENADRYL) 25 mg capsule Take 25 mg by mouth as needed. 10/06/2015: Received from: [facility]: Take 25 mg by mouth.   glucose blood test strip TEST BLOOD GLUCOSE THREE TIMES DAILY    hydrALAZINE (APRESOLINE) 50 MG tablet NEW PRESCRIPTION REQUEST: Hydralazine 50 MG Tablet - TAKE TWO TABLETS BY MOUTH TWICE DAILY    insulin NPH Human (NOVOLIN N) 100 UNIT/ML injection Inject into the skin. Inject 8 Units into the skin 2 times daily with meals    mupirocin cream (BACTROBAN) 2 % Apply 1 application topically daily.    NIFEdipine (ADALAT CC) 90 MG 24 hr tablet NEW PRESCRIPTION REQUEST: Nifedipine Er 90 MG Tablet,extended Release - TAKE ONE TABLET BY MOUTH DAILY    NIFEdipine (PROCARDIA XL/NIFEDICAL-XL) 90 MG 24 hr tablet     omeprazole (PRILOSEC) 40 MG capsule NEW PRESCRIPTION REQUEST: Omeprazole 40 MG Capsule,delayed Release - TAKE ONE CAPSULE BY MOUTH DAILY    polyethylene glycol (MIRALAX / GLYCOLAX) packet Take 17 g by mouth.    PRESCRIPTION MEDICATION 210 mg 3 (three) times daily  before meals.    senna-docusate (SENOKOT-S) 8.6-50 MG tablet Take by mouth 2 (two) times daily. Take 2 tab twice daily.    VELPHORO 500 MG chewable tablet CHEW 1 THREE TIMES A DAY WITH MEALS    [DISCONTINUED] losartan (COZAAR) 50 MG tablet NEW PRESCRIPTION REQUEST: Losartan 50 MG Tablet - TAKE TWO TABLETS BY MOUTH TWICE DAILY 12/01/2021: change to 100 mg twicw a day   No facility-administered encounter medications on file as of 12/01/2021.   Patient Active Problem  List   Diagnosis Date Noted   CAP (community acquired pneumonia) 11/29/2021   Decreased functional mobility and endurance 09/20/2021   ESRD on hemodialysis ([place]) 09/20/2021   Hx of transient ischemic attack (TIA) 09/20/2021   Pressure ulcer of coccygeal region, stage 1 08/06/2021   Grieving 10/12/2020   Shoulder pain, right 10/11/2020   Hematoma and contusion 08/23/2020   Intertrigo 04/06/2020   Pressure sore 04/06/2020   Acute respiratory failure due to COVID-19 ([place]) 04/05/2020   Foot ulcer ([place]) 12/24/2019   Ankle sprain 12/24/2019   Hypomagnesemia 10/12/2019   History of cardioembolic cerebrovascular accident (CVA) 09/22/2019   Daytime somnolence 09/09/2019   Laceration of right lower leg 02/26/2019   History of colon polyps 11/12/2017   Liver cirrhosis ([place]) 09/21/2017   CAD (coronary artery disease) 09/21/2017   Arthritis 05/21/2017   Blurred vision, left eye 05/21/2017   Glomerulonephritis 05/21/2017   H/O unilateral nephrectomy 05/21/2017   Hx of gastric ulcer 05/21/2017   Neuropathy of both feet 05/21/2017   Situational anxiety 05/21/2017   Positive cardiac stress test 02/22/2017   Pre-transplant evaluation for kidney transplant 12/25/2016   Type 2 diabetes mellitus with hyperosmolar nonketotic hyperglycemia ([place]) 12/19/2016   UTI (urinary tract infection) 12/19/2016   Anemia 11/01/2016   Diarrhea 11/01/2016   Volume overload 03/31/2016   AKI (acute kidney injury) ([place]) 03/14/2016   ESRD (end stage renal disease) on dialysis ([place]) 10/06/2015   Situational mixed anxiety and depressive disorder 10/06/2015   Iron deficiency anemia 10/03/2015   Acquired solitary kidney 81/85/6314   Metabolic bone disease 97/07/6376   Vitamin D deficiency 09/03/2015   Hyperkalemia 02/01/2013   Edema 10/27/2012   TIA (transient ischemic attack) 03/21/2012   HLD (hyperlipidemia) 02/16/2012   Encephalopathy acute 01/29/2012   Cerebral infarction, unspecified ([place]) 01/25/2012   Weight  loss 01/24/2012   LBP (low back pain) 01/24/2012   Chest pain, atypical 09/25/2011   Vagina bleeding 01/18/2011   ALLERGIC RHINITIS 09/05/2010   TACHYCARDIA 09/05/2010   DYSPNEA 09/05/2010   Diabetes mellitus type 2 with complications ([place]) 58/85/0277   Dyslipidemia 02/19/2007   Renal hypertension 02/19/2007   GERD 02/19/2007   Essential hypertension 02/19/2007   Conditions to be addressed/monitored: DMII and ESRD- on peritoneal dialysis  Care Plan : RN Care Manager Plan of Care  Updates made by Knox Royalty, RN since 12/01/2021 12:00 AM     Problem: Chronic Disease Management Needs   Priority: High     Long-Range Goal: Ongoing adherence to established plan of care for long term chronic disease management   Start Date: 10/25/2021  Expected End Date: 10/26/2022  Priority: High  Note:   Current Barriers:  Chronic Disease Management support and education needs related to DMII and ESRD Need for assistance with ADL's and iADL's-- Clinic CSW currently active and assisting patient-- she would like to have personal care services initiated Moderate food insecurity: community resource care guide referral placed 12/01/21: patient confirms she has communicated with [employer]  Resource Care Guide 10/26/21; denies additional needs today Need for clinic pharmacy support with medication optimization; assistance with CGM supplies: Clinic pharmacy referral placed Recent unplanned hospitalization [facility]) May 11-15, 2023 for N/V, DKA; CAP: discharged home to self-care with home health services through [facility]):  Patient will demonstrate ongoing health management independence as evidenced by adherence to plan of care for DMII and ESRD- on peritoneal dialysis        through collaboration with RN Care manager, provider, and care team.   Interventions: 1:1 collaboration with primary care provider regarding development and update of comprehensive plan of care as evidenced by provider  attestation and co-signature Inter-disciplinary care team collaboration (see longitudinal plan of care) Evaluation of current treatment plan related to  self management and patient's adherence to plan as established by provider Review of patient status, including review of consultants reports, relevant laboratory and other test results, and medications completed Medications discussed: reports continues to independently self-manage and denies current concerns/ issues/ questions around medications; endorses adherence to taking all medications as prescribed Reviewed PCP office visit 11/29/21; she denies questions post- office visit Reviewed upcoming scheduled provider appointments: 12/26/21- Clinic CSW;  12/28/21- bone density scan; 01/23/22- PCP; patient confirms is aware of all and has plans to attend as scheduled Discussed plans with patient for ongoing care management follow up and provided patient with direct contact information for care management team     Diabetes/ ESRD with peritoneal dialysis:  (Status: 12/01/21: Goal on Track (progressing): YES.) Long Term Goal   Lab Results  Component Value Date   HGBA1C 12.6 (A) 06/16/2021   HGBA1C 12.6 06/16/2021   HGBA1C 12.6 (A) 06/16/2021   HGBA1C 12.6 (A) 06/16/2021    Provided education to patient about basic DM disease process; Reviewed prescribed diet with patient renal, carbohydrate modified, low sugar; heart healthy low salt; Counseled on importance of regular laboratory monitoring as prescribed;        Confirmed patient report that her previously reported issues with obtaining FSL-2 supplies for CGM monitor were resolved at 11/29/21 PCP office visit; reports she has not yet received supplies from her mail-order pharmacy; states she will contact them today to make sure they have what they need/ mail to her soon Confirmed patient continues to try to follow prescribed diet: she tells me she sees a nutritionist "every 2 weeks" at her dialysis center;  she verbalizes a very good understanding of prescribed renal/ DMII diet and endorses adherence to same Confirmed patient self-manages  peritoneal dialysis at home during night time sleeping hours: she reports has been managing dialysis for several years and verbalizes very good understanding of same Confirms patient monitors blood pressures at home daily as part of her peritoneal dialysis routine: she again reports general blood pressure ranges consistently between 138-150/60-68  Patient Goals/Self-Care Activities: As evidenced by review of EHR, collaboration with care team, and patient reporting during CCM RN CM outreach,  Patient Jasline will: Take medications as prescribed Attend all scheduled provider appointments Call pharmacy for medication refills Call provider office for new concerns or questions Continue to check fasting (first thing in the morning, before eating) and then again 2-hours after eating blood sugars at home Monitor your blood sugars using your continuous glucose monitor anytime you feel that your blood sugars may be running lower or higher than they should Continue to follow heart healthy, low salt, low cholesterol, carbohydrate-modified, low sugar diet, and renal diet: continue maintaining contact with the nutritionist at your  dialysis center Continue monitoring your blood pressures at home daily around your normal routine when you administer your peritoneal dialysis Continue your efforts to prevent falls- continue to use your wheelchair or walker    Plan:  Telephone follow up appointment with care management team member scheduled for: Tuesday, January 17, 2022 at 10:30 am The patient has been provided with contact information for the care management team and has been advised to call with any health related questions or concerns   Oneta Rack, RN, BSN, [place] [phone]: direct  office

## 2021-12-01 NOTE — Patient Instructions (Signed)
Visit Information  Andy, thank you for taking time to talk with me today. Please don't hesitate to contact me if I can be of assistance to you before our next scheduled telephone appointment  Below are the goals we discussed today:  Patient Self-Care Activities: Patient Mary Valencia will: Take medications as prescribed Attend all scheduled provider appointments Call pharmacy for medication refills Call provider office for new concerns or questions Continue to check fasting (first thing in the morning, before eating) and then again 2-hours after eating blood sugars at home Monitor your blood sugars using your continuous glucose monitor anytime you feel that your blood sugars may be running lower or higher than they should Continue to follow heart healthy, low salt, low cholesterol, carbohydrate-modified, low sugar diet, and renal diet: continue maintaining contact with the nutritionist at your dialysis center Continue monitoring your blood pressures at home daily around your normal routine when you administer your peritoneal dialysis Continue your efforts to prevent falls- continue to use your wheelchair or walker  Our next scheduled telephone follow up visit/ appointment is scheduled on: Tuesday, January 17, 2022 at 10:30 am- This is a PHONE [place] appointment  If you need to cancel or re-schedule our visit, please call [phone] and our care guide team will be happy to assist you.   I look forward to hearing about your progress.   Oneta Rack, RN, BSN, [place] [phone]: direct office  If you are experiencing a Mental Health or [place] or need someone to talk to, please  call the Suicide and Crisis Lifeline: 988 call the [place] National Suicide Prevention Lifeline: 740-388-1174 or TTY: 607-856-7279 TTY 206 452 5819) to talk to a trained counselor call 1-800-273-TALK (toll free, 24 hour hotline) go to  [facility] [address] [place] [id number]) call 911   Patient verbalizes understanding of instructions and care plan provided today and agrees to view in [place]. Active MyChart status and patient understanding of how to access instructions and care plan via MyChart confirmed with patient.    Telephone follow up appointment with care management team member scheduled for: Tuesday, January 17, 2022 at 10:30 am The patient has been provided with contact information for the care management team and has been advised to call with any health related questions or concerns  Diabetes Mellitus and Standards of [place] with and managing diabetes (diabetes mellitus) can be complicated. Your diabetes treatment may be managed by a team of health care providers, including: A physician who specializes in diabetes (endocrinologist). You might also have visits with a nurse practitioner or physician assistant. Nurses. A registered dietitian. A certified diabetes care and education specialist. An exercise specialist. A pharmacist. An eye doctor. A foot specialist (podiatrist). A dental care provider. A primary care provider. A mental health care provider. How to manage your diabetes You can do many things to successfully manage your diabetes. Your health care providers will follow guidelines to help you get the best quality of care. Here are general guidelines for your diabetes management plan. Your health care providers may give you more specific instructions. Physical exams When you are diagnosed with diabetes, and each year after that, your health care provider will ask about your medical and family history. You will have a physical exam, which may include: Measuring your height, weight, and body mass index (BMI). Checking your blood pressure. This will be done at every routine medical visit.  Your target blood pressure may vary depending on your medical  conditions, your age, and other factors. A thyroid exam. A skin exam. Screening for nerve damage (peripheral neuropathy). This may include checking the pulse in your legs and feet and the level of sensation in your hands and feet. A foot exam to inspect the structure and skin of your feet, including checking for cuts, bruises, redness, blisters, sores, or other problems. Screening for blood vessel (vascular) problems. This may include checking the pulse in your legs and feet and checking your temperature. Blood tests Depending on your treatment plan and your personal needs, you may have the following tests: Hemoglobin A1C (HbA1C). This test provides information about blood sugar (glucose) control over the previous 2-3 months. It is used to adjust your treatment plan, if needed. This test will be done: At least 2 times a year, if you are meeting your treatment goals. 4 times a year, if you are not meeting your treatment goals or if your goals have changed. Lipid testing, including total cholesterol, LDL and HDL cholesterol, and triglyceride levels. The goal for LDL is less than 100 mg/dL (5.5 mmol/L). If you are at high risk for complications, the goal is less than 70 mg/dL (3.9 mmol/L). The goal for HDL is 40 mg/dL (2.2 mmol/L) or higher for men, and 50 mg/dL (2.8 mmol/L) or higher for women. An HDL cholesterol of 60 mg/dL (3.3 mmol/L) or higher gives some protection against heart disease. The goal for triglycerides is less than 150 mg/dL (8.3 mmol/L). Liver function tests. Kidney function tests. Thyroid function tests.  Dental and eye exams  Visit your dentist two times a year. If you have type 1 diabetes, your health care provider may recommend an eye exam within 5 years after you are diagnosed, and then once a year after your first exam. For children with type 1 diabetes, the health care provider may recommend an eye exam when your child is age 79 or older and has had diabetes for 3-5  years. After the first exam, your child should get an eye exam once a year. If you have type 2 diabetes, your health care provider may recommend an eye exam as soon as you are diagnosed, and then every 1-2 years after your first exam. Immunizations A yearly flu (influenza) vaccine is recommended annually for everyone 6 months or older. This is especially important if you have diabetes. The pneumonia (pneumococcal) vaccine is recommended for everyone 2 years or older who has diabetes. If you are age 56 or older, you may get the pneumonia vaccine as a series of two separate shots. The hepatitis B vaccine is recommended for adults shortly after being diagnosed with diabetes. Adults and children with diabetes should receive all other vaccines according to age-specific recommendations from the Centers for Disease Control and Prevention (CDC). Mental and emotional health Screening for symptoms of eating disorders, anxiety, and depression is recommended at the time of diagnosis and after as needed. If your screening shows that you have symptoms, you may need more evaluation. You may work with a mental health care provider. Follow these instructions at home: Treatment plan You will monitor your blood glucose levels and may give yourself insulin. Your treatment plan will be reviewed at every medical visit. You and your health care provider will discuss: How you are taking your medicines, including insulin. Any side effects you have. Your blood glucose level target goals. How often you monitor your blood glucose level. Lifestyle habits, such as  activity level and tobacco, alcohol, and substance use. Education Your health care provider will assess how well you are monitoring your blood glucose levels and whether you are taking your insulin and medicines correctly. He or she may refer you to: A certified diabetes care and education specialist to manage your diabetes throughout your life, starting at  diagnosis. A registered dietitian who can create and review your personal nutrition plan. An exercise specialist who can discuss your activity level and exercise plan. General instructions Take over-the-counter and prescription medicines only as told by your health care provider. Keep all follow-up visits. This is important. Where to find support There are many diabetes support networks, including: American Diabetes Association (ADA): diabetes.org Defeat Diabetes Foundation: defeatdiabetes.org Where to find more information American Diabetes Association (ADA): www.diabetes.org Association of Diabetes Care & Education Specialists (ADCES): diabeteseducator.org International Diabetes Federation (IDF): https://www.munoz-bell.org/ Summary Managing diabetes (diabetes mellitus) can be complicated. Your diabetes treatment may be managed by a team of health care providers. Your health care providers follow guidelines to help you get the best quality care. You should have physical exams, blood tests, blood pressure monitoring, immunizations, and screening tests regularly. Stay updated on how to manage your diabetes. Your health care providers may also give you more specific instructions based on your individual health. This information is not intended to replace advice given to you by your health care provider. Make sure you discuss any questions you have with your health care provider. Document Revised: 12/18/2019 Document Reviewed: 12/18/2019 Elsevier Patient Education  [place].

## 2021-12-02 DIAGNOSIS — D631 Anemia in chronic kidney disease: Secondary | ICD-10-CM | POA: Diagnosis not present

## 2021-12-02 DIAGNOSIS — N2581 Secondary hyperparathyroidism of renal origin: Secondary | ICD-10-CM | POA: Diagnosis not present

## 2021-12-02 DIAGNOSIS — N186 End stage renal disease: Secondary | ICD-10-CM | POA: Diagnosis not present

## 2021-12-03 DIAGNOSIS — N2581 Secondary hyperparathyroidism of renal origin: Secondary | ICD-10-CM | POA: Diagnosis not present

## 2021-12-03 DIAGNOSIS — N186 End stage renal disease: Secondary | ICD-10-CM | POA: Diagnosis not present

## 2021-12-03 DIAGNOSIS — D631 Anemia in chronic kidney disease: Secondary | ICD-10-CM | POA: Diagnosis not present

## 2021-12-04 DIAGNOSIS — D631 Anemia in chronic kidney disease: Secondary | ICD-10-CM | POA: Diagnosis not present

## 2021-12-04 DIAGNOSIS — N186 End stage renal disease: Secondary | ICD-10-CM | POA: Diagnosis not present

## 2021-12-04 DIAGNOSIS — N2581 Secondary hyperparathyroidism of renal origin: Secondary | ICD-10-CM | POA: Diagnosis not present

## 2021-12-05 DIAGNOSIS — N2581 Secondary hyperparathyroidism of renal origin: Secondary | ICD-10-CM | POA: Diagnosis not present

## 2021-12-05 DIAGNOSIS — D631 Anemia in chronic kidney disease: Secondary | ICD-10-CM | POA: Diagnosis not present

## 2021-12-05 DIAGNOSIS — N186 End stage renal disease: Secondary | ICD-10-CM | POA: Diagnosis not present

## 2021-12-06 DIAGNOSIS — D631 Anemia in chronic kidney disease: Secondary | ICD-10-CM | POA: Diagnosis not present

## 2021-12-06 DIAGNOSIS — N186 End stage renal disease: Secondary | ICD-10-CM | POA: Diagnosis not present

## 2021-12-06 DIAGNOSIS — N2581 Secondary hyperparathyroidism of renal origin: Secondary | ICD-10-CM | POA: Diagnosis not present

## 2021-12-07 DIAGNOSIS — M25511 Pain in right shoulder: Secondary | ICD-10-CM | POA: Diagnosis not present

## 2021-12-07 DIAGNOSIS — Z9089 Acquired absence of other organs: Secondary | ICD-10-CM | POA: Diagnosis not present

## 2021-12-07 DIAGNOSIS — N186 End stage renal disease: Secondary | ICD-10-CM | POA: Diagnosis not present

## 2021-12-07 DIAGNOSIS — E46 Unspecified protein-calorie malnutrition: Secondary | ICD-10-CM | POA: Diagnosis not present

## 2021-12-07 DIAGNOSIS — N2581 Secondary hyperparathyroidism of renal origin: Secondary | ICD-10-CM | POA: Diagnosis not present

## 2021-12-07 DIAGNOSIS — Z8616 Personal history of COVID-19: Secondary | ICD-10-CM | POA: Diagnosis not present

## 2021-12-07 DIAGNOSIS — E559 Vitamin D deficiency, unspecified: Secondary | ICD-10-CM | POA: Diagnosis not present

## 2021-12-07 DIAGNOSIS — K219 Gastro-esophageal reflux disease without esophagitis: Secondary | ICD-10-CM | POA: Diagnosis not present

## 2021-12-07 DIAGNOSIS — E1122 Type 2 diabetes mellitus with diabetic chronic kidney disease: Secondary | ICD-10-CM | POA: Diagnosis not present

## 2021-12-07 DIAGNOSIS — E785 Hyperlipidemia, unspecified: Secondary | ICD-10-CM | POA: Diagnosis not present

## 2021-12-07 DIAGNOSIS — Z794 Long term (current) use of insulin: Secondary | ICD-10-CM | POA: Diagnosis not present

## 2021-12-07 DIAGNOSIS — Z7982 Long term (current) use of aspirin: Secondary | ICD-10-CM | POA: Diagnosis not present

## 2021-12-07 DIAGNOSIS — I129 Hypertensive chronic kidney disease with stage 1 through stage 4 chronic kidney disease, or unspecified chronic kidney disease: Secondary | ICD-10-CM | POA: Diagnosis not present

## 2021-12-07 DIAGNOSIS — G8929 Other chronic pain: Secondary | ICD-10-CM | POA: Diagnosis not present

## 2021-12-07 DIAGNOSIS — Z951 Presence of aortocoronary bypass graft: Secondary | ICD-10-CM | POA: Diagnosis not present

## 2021-12-07 DIAGNOSIS — Z992 Dependence on renal dialysis: Secondary | ICD-10-CM | POA: Diagnosis not present

## 2021-12-07 DIAGNOSIS — I2510 Atherosclerotic heart disease of native coronary artery without angina pectoris: Secondary | ICD-10-CM | POA: Diagnosis not present

## 2021-12-07 DIAGNOSIS — Z9181 History of falling: Secondary | ICD-10-CM | POA: Diagnosis not present

## 2021-12-07 DIAGNOSIS — E1140 Type 2 diabetes mellitus with diabetic neuropathy, unspecified: Secondary | ICD-10-CM | POA: Diagnosis not present

## 2021-12-07 DIAGNOSIS — D631 Anemia in chronic kidney disease: Secondary | ICD-10-CM | POA: Diagnosis not present

## 2021-12-07 DIAGNOSIS — Z993 Dependence on wheelchair: Secondary | ICD-10-CM | POA: Diagnosis not present

## 2021-12-07 DIAGNOSIS — Z905 Acquired absence of kidney: Secondary | ICD-10-CM | POA: Diagnosis not present

## 2021-12-07 DIAGNOSIS — E1110 Type 2 diabetes mellitus with ketoacidosis without coma: Secondary | ICD-10-CM | POA: Diagnosis not present

## 2021-12-07 DIAGNOSIS — Z8673 Personal history of transient ischemic attack (TIA), and cerebral infarction without residual deficits: Secondary | ICD-10-CM | POA: Diagnosis not present

## 2021-12-08 DIAGNOSIS — N186 End stage renal disease: Secondary | ICD-10-CM | POA: Diagnosis not present

## 2021-12-08 DIAGNOSIS — E119 Type 2 diabetes mellitus without complications: Secondary | ICD-10-CM | POA: Diagnosis not present

## 2021-12-08 DIAGNOSIS — E559 Vitamin D deficiency, unspecified: Secondary | ICD-10-CM | POA: Diagnosis not present

## 2021-12-08 DIAGNOSIS — D631 Anemia in chronic kidney disease: Secondary | ICD-10-CM | POA: Diagnosis not present

## 2021-12-08 DIAGNOSIS — Z9181 History of falling: Secondary | ICD-10-CM | POA: Diagnosis not present

## 2021-12-08 DIAGNOSIS — Z9089 Acquired absence of other organs: Secondary | ICD-10-CM | POA: Diagnosis not present

## 2021-12-08 DIAGNOSIS — G8929 Other chronic pain: Secondary | ICD-10-CM | POA: Diagnosis not present

## 2021-12-08 DIAGNOSIS — Z905 Acquired absence of kidney: Secondary | ICD-10-CM | POA: Diagnosis not present

## 2021-12-08 DIAGNOSIS — Z993 Dependence on wheelchair: Secondary | ICD-10-CM | POA: Diagnosis not present

## 2021-12-08 DIAGNOSIS — E1122 Type 2 diabetes mellitus with diabetic chronic kidney disease: Secondary | ICD-10-CM | POA: Diagnosis not present

## 2021-12-08 DIAGNOSIS — E46 Unspecified protein-calorie malnutrition: Secondary | ICD-10-CM | POA: Diagnosis not present

## 2021-12-08 DIAGNOSIS — Z8616 Personal history of COVID-19: Secondary | ICD-10-CM | POA: Diagnosis not present

## 2021-12-08 DIAGNOSIS — E1140 Type 2 diabetes mellitus with diabetic neuropathy, unspecified: Secondary | ICD-10-CM | POA: Diagnosis not present

## 2021-12-08 DIAGNOSIS — M25511 Pain in right shoulder: Secondary | ICD-10-CM | POA: Diagnosis not present

## 2021-12-08 DIAGNOSIS — Z951 Presence of aortocoronary bypass graft: Secondary | ICD-10-CM | POA: Diagnosis not present

## 2021-12-08 DIAGNOSIS — Z8673 Personal history of transient ischemic attack (TIA), and cerebral infarction without residual deficits: Secondary | ICD-10-CM | POA: Diagnosis not present

## 2021-12-08 DIAGNOSIS — I129 Hypertensive chronic kidney disease with stage 1 through stage 4 chronic kidney disease, or unspecified chronic kidney disease: Secondary | ICD-10-CM | POA: Diagnosis not present

## 2021-12-08 DIAGNOSIS — E785 Hyperlipidemia, unspecified: Secondary | ICD-10-CM | POA: Diagnosis not present

## 2021-12-08 DIAGNOSIS — N2581 Secondary hyperparathyroidism of renal origin: Secondary | ICD-10-CM | POA: Diagnosis not present

## 2021-12-08 DIAGNOSIS — I2510 Atherosclerotic heart disease of native coronary artery without angina pectoris: Secondary | ICD-10-CM | POA: Diagnosis not present

## 2021-12-08 DIAGNOSIS — E1110 Type 2 diabetes mellitus with ketoacidosis without coma: Secondary | ICD-10-CM | POA: Diagnosis not present

## 2021-12-08 DIAGNOSIS — Z7982 Long term (current) use of aspirin: Secondary | ICD-10-CM | POA: Diagnosis not present

## 2021-12-08 DIAGNOSIS — K219 Gastro-esophageal reflux disease without esophagitis: Secondary | ICD-10-CM | POA: Diagnosis not present

## 2021-12-08 DIAGNOSIS — Z794 Long term (current) use of insulin: Secondary | ICD-10-CM | POA: Diagnosis not present

## 2021-12-08 DIAGNOSIS — Z992 Dependence on renal dialysis: Secondary | ICD-10-CM | POA: Diagnosis not present

## 2021-12-09 DIAGNOSIS — N186 End stage renal disease: Secondary | ICD-10-CM | POA: Diagnosis not present

## 2021-12-09 DIAGNOSIS — N2581 Secondary hyperparathyroidism of renal origin: Secondary | ICD-10-CM | POA: Diagnosis not present

## 2021-12-09 DIAGNOSIS — D631 Anemia in chronic kidney disease: Secondary | ICD-10-CM | POA: Diagnosis not present

## 2021-12-10 DIAGNOSIS — D631 Anemia in chronic kidney disease: Secondary | ICD-10-CM | POA: Diagnosis not present

## 2021-12-10 DIAGNOSIS — N2581 Secondary hyperparathyroidism of renal origin: Secondary | ICD-10-CM | POA: Diagnosis not present

## 2021-12-10 DIAGNOSIS — N186 End stage renal disease: Secondary | ICD-10-CM | POA: Diagnosis not present

## 2021-12-11 DIAGNOSIS — D631 Anemia in chronic kidney disease: Secondary | ICD-10-CM | POA: Diagnosis not present

## 2021-12-11 DIAGNOSIS — N2581 Secondary hyperparathyroidism of renal origin: Secondary | ICD-10-CM | POA: Diagnosis not present

## 2021-12-11 DIAGNOSIS — N186 End stage renal disease: Secondary | ICD-10-CM | POA: Diagnosis not present

## 2021-12-12 DIAGNOSIS — Z9089 Acquired absence of other organs: Secondary | ICD-10-CM | POA: Diagnosis not present

## 2021-12-12 DIAGNOSIS — E1110 Type 2 diabetes mellitus with ketoacidosis without coma: Secondary | ICD-10-CM | POA: Diagnosis not present

## 2021-12-12 DIAGNOSIS — Z993 Dependence on wheelchair: Secondary | ICD-10-CM | POA: Diagnosis not present

## 2021-12-12 DIAGNOSIS — K219 Gastro-esophageal reflux disease without esophagitis: Secondary | ICD-10-CM | POA: Diagnosis not present

## 2021-12-12 DIAGNOSIS — M25511 Pain in right shoulder: Secondary | ICD-10-CM | POA: Diagnosis not present

## 2021-12-12 DIAGNOSIS — N2581 Secondary hyperparathyroidism of renal origin: Secondary | ICD-10-CM | POA: Diagnosis not present

## 2021-12-12 DIAGNOSIS — E785 Hyperlipidemia, unspecified: Secondary | ICD-10-CM | POA: Diagnosis not present

## 2021-12-12 DIAGNOSIS — N186 End stage renal disease: Secondary | ICD-10-CM | POA: Diagnosis not present

## 2021-12-12 DIAGNOSIS — I2510 Atherosclerotic heart disease of native coronary artery without angina pectoris: Secondary | ICD-10-CM | POA: Diagnosis not present

## 2021-12-12 DIAGNOSIS — Z8616 Personal history of COVID-19: Secondary | ICD-10-CM | POA: Diagnosis not present

## 2021-12-12 DIAGNOSIS — E1122 Type 2 diabetes mellitus with diabetic chronic kidney disease: Secondary | ICD-10-CM | POA: Diagnosis not present

## 2021-12-12 DIAGNOSIS — E1140 Type 2 diabetes mellitus with diabetic neuropathy, unspecified: Secondary | ICD-10-CM | POA: Diagnosis not present

## 2021-12-12 DIAGNOSIS — E559 Vitamin D deficiency, unspecified: Secondary | ICD-10-CM | POA: Diagnosis not present

## 2021-12-12 DIAGNOSIS — Z7982 Long term (current) use of aspirin: Secondary | ICD-10-CM | POA: Diagnosis not present

## 2021-12-12 DIAGNOSIS — Z8673 Personal history of transient ischemic attack (TIA), and cerebral infarction without residual deficits: Secondary | ICD-10-CM | POA: Diagnosis not present

## 2021-12-12 DIAGNOSIS — Z9181 History of falling: Secondary | ICD-10-CM | POA: Diagnosis not present

## 2021-12-12 DIAGNOSIS — I129 Hypertensive chronic kidney disease with stage 1 through stage 4 chronic kidney disease, or unspecified chronic kidney disease: Secondary | ICD-10-CM | POA: Diagnosis not present

## 2021-12-12 DIAGNOSIS — Z992 Dependence on renal dialysis: Secondary | ICD-10-CM | POA: Diagnosis not present

## 2021-12-12 DIAGNOSIS — Z951 Presence of aortocoronary bypass graft: Secondary | ICD-10-CM | POA: Diagnosis not present

## 2021-12-12 DIAGNOSIS — Z905 Acquired absence of kidney: Secondary | ICD-10-CM | POA: Diagnosis not present

## 2021-12-12 DIAGNOSIS — Z794 Long term (current) use of insulin: Secondary | ICD-10-CM | POA: Diagnosis not present

## 2021-12-12 DIAGNOSIS — G8929 Other chronic pain: Secondary | ICD-10-CM | POA: Diagnosis not present

## 2021-12-12 DIAGNOSIS — D631 Anemia in chronic kidney disease: Secondary | ICD-10-CM | POA: Diagnosis not present

## 2021-12-12 DIAGNOSIS — E46 Unspecified protein-calorie malnutrition: Secondary | ICD-10-CM | POA: Diagnosis not present

## 2021-12-13 DIAGNOSIS — D631 Anemia in chronic kidney disease: Secondary | ICD-10-CM | POA: Diagnosis not present

## 2021-12-13 DIAGNOSIS — N2581 Secondary hyperparathyroidism of renal origin: Secondary | ICD-10-CM | POA: Diagnosis not present

## 2021-12-13 DIAGNOSIS — N186 End stage renal disease: Secondary | ICD-10-CM | POA: Diagnosis not present

## 2021-12-14 DIAGNOSIS — N186 End stage renal disease: Secondary | ICD-10-CM | POA: Diagnosis not present

## 2021-12-14 DIAGNOSIS — N2581 Secondary hyperparathyroidism of renal origin: Secondary | ICD-10-CM | POA: Diagnosis not present

## 2021-12-14 DIAGNOSIS — D631 Anemia in chronic kidney disease: Secondary | ICD-10-CM | POA: Diagnosis not present

## 2021-12-15 DIAGNOSIS — D631 Anemia in chronic kidney disease: Secondary | ICD-10-CM | POA: Diagnosis not present

## 2021-12-15 DIAGNOSIS — N186 End stage renal disease: Secondary | ICD-10-CM | POA: Diagnosis not present

## 2021-12-15 DIAGNOSIS — N2581 Secondary hyperparathyroidism of renal origin: Secondary | ICD-10-CM | POA: Diagnosis not present

## 2021-12-16 ENCOUNTER — Ambulatory Visit: Payer: Medicare Other

## 2021-12-16 DIAGNOSIS — N2581 Secondary hyperparathyroidism of renal origin: Secondary | ICD-10-CM | POA: Diagnosis not present

## 2021-12-16 DIAGNOSIS — D631 Anemia in chronic kidney disease: Secondary | ICD-10-CM | POA: Diagnosis not present

## 2021-12-16 DIAGNOSIS — N186 End stage renal disease: Secondary | ICD-10-CM | POA: Diagnosis not present

## 2021-12-17 DIAGNOSIS — N186 End stage renal disease: Secondary | ICD-10-CM | POA: Diagnosis not present

## 2021-12-17 DIAGNOSIS — D631 Anemia in chronic kidney disease: Secondary | ICD-10-CM | POA: Diagnosis not present

## 2021-12-17 DIAGNOSIS — N2581 Secondary hyperparathyroidism of renal origin: Secondary | ICD-10-CM | POA: Diagnosis not present

## 2021-12-18 DIAGNOSIS — N2581 Secondary hyperparathyroidism of renal origin: Secondary | ICD-10-CM | POA: Diagnosis not present

## 2021-12-18 DIAGNOSIS — N186 End stage renal disease: Secondary | ICD-10-CM | POA: Diagnosis not present

## 2021-12-18 DIAGNOSIS — D631 Anemia in chronic kidney disease: Secondary | ICD-10-CM | POA: Diagnosis not present

## 2021-12-19 DIAGNOSIS — N186 End stage renal disease: Secondary | ICD-10-CM | POA: Diagnosis not present

## 2021-12-19 DIAGNOSIS — D631 Anemia in chronic kidney disease: Secondary | ICD-10-CM | POA: Diagnosis not present

## 2021-12-19 DIAGNOSIS — N2581 Secondary hyperparathyroidism of renal origin: Secondary | ICD-10-CM | POA: Diagnosis not present

## 2021-12-20 DIAGNOSIS — N2581 Secondary hyperparathyroidism of renal origin: Secondary | ICD-10-CM | POA: Diagnosis not present

## 2021-12-20 DIAGNOSIS — D631 Anemia in chronic kidney disease: Secondary | ICD-10-CM | POA: Diagnosis not present

## 2021-12-20 DIAGNOSIS — N186 End stage renal disease: Secondary | ICD-10-CM | POA: Diagnosis not present

## 2021-12-21 DIAGNOSIS — N2581 Secondary hyperparathyroidism of renal origin: Secondary | ICD-10-CM | POA: Diagnosis not present

## 2021-12-21 DIAGNOSIS — N186 End stage renal disease: Secondary | ICD-10-CM | POA: Diagnosis not present

## 2021-12-21 DIAGNOSIS — D631 Anemia in chronic kidney disease: Secondary | ICD-10-CM | POA: Diagnosis not present

## 2021-12-22 DIAGNOSIS — N2581 Secondary hyperparathyroidism of renal origin: Secondary | ICD-10-CM | POA: Diagnosis not present

## 2021-12-22 DIAGNOSIS — D631 Anemia in chronic kidney disease: Secondary | ICD-10-CM | POA: Diagnosis not present

## 2021-12-22 DIAGNOSIS — N186 End stage renal disease: Secondary | ICD-10-CM | POA: Diagnosis not present

## 2021-12-23 DIAGNOSIS — Z992 Dependence on renal dialysis: Secondary | ICD-10-CM | POA: Diagnosis not present

## 2021-12-23 DIAGNOSIS — N186 End stage renal disease: Secondary | ICD-10-CM | POA: Diagnosis not present

## 2021-12-23 DIAGNOSIS — N2581 Secondary hyperparathyroidism of renal origin: Secondary | ICD-10-CM | POA: Diagnosis not present

## 2021-12-23 DIAGNOSIS — D631 Anemia in chronic kidney disease: Secondary | ICD-10-CM | POA: Diagnosis not present

## 2021-12-24 DIAGNOSIS — N2581 Secondary hyperparathyroidism of renal origin: Secondary | ICD-10-CM | POA: Diagnosis not present

## 2021-12-24 DIAGNOSIS — N186 End stage renal disease: Secondary | ICD-10-CM | POA: Diagnosis not present

## 2021-12-24 DIAGNOSIS — D631 Anemia in chronic kidney disease: Secondary | ICD-10-CM | POA: Diagnosis not present

## 2021-12-25 DIAGNOSIS — D631 Anemia in chronic kidney disease: Secondary | ICD-10-CM | POA: Diagnosis not present

## 2021-12-25 DIAGNOSIS — N2581 Secondary hyperparathyroidism of renal origin: Secondary | ICD-10-CM | POA: Diagnosis not present

## 2021-12-25 DIAGNOSIS — N186 End stage renal disease: Secondary | ICD-10-CM | POA: Diagnosis not present

## 2021-12-26 ENCOUNTER — Encounter: Payer: Self-pay | Admitting: Licensed Clinical Social Worker

## 2021-12-26 ENCOUNTER — Ambulatory Visit: Payer: Self-pay | Admitting: *Deleted

## 2021-12-26 ENCOUNTER — Ambulatory Visit: Payer: Medicare Other | Admitting: Licensed Clinical Social Worker

## 2021-12-26 ENCOUNTER — Encounter: Payer: Self-pay | Admitting: *Deleted

## 2021-12-26 DIAGNOSIS — D631 Anemia in chronic kidney disease: Secondary | ICD-10-CM | POA: Diagnosis not present

## 2021-12-26 DIAGNOSIS — N2581 Secondary hyperparathyroidism of renal origin: Secondary | ICD-10-CM | POA: Diagnosis not present

## 2021-12-26 DIAGNOSIS — E119 Type 2 diabetes mellitus without complications: Secondary | ICD-10-CM | POA: Diagnosis not present

## 2021-12-26 DIAGNOSIS — E118 Type 2 diabetes mellitus with unspecified complications: Secondary | ICD-10-CM

## 2021-12-26 DIAGNOSIS — N186 End stage renal disease: Secondary | ICD-10-CM | POA: Diagnosis not present

## 2021-12-26 DIAGNOSIS — Z741 Need for assistance with personal care: Secondary | ICD-10-CM

## 2021-12-26 NOTE — Chronic Care Management (AMB) (Signed)
Care Management Clinical Social Work Note  12/26/2021 Name: Mary Valencia MRN: [id number] DOB: [birth year]  Mary Valencia is a 66 y.o. year old female who is a primary care patient of Plotnikov, Evie Lacks, MD.  The Care Management team was consulted for assistance with chronic disease management and coordination needs.  Engaged with patient by telephone for follow up visit in response to provider referral for social work chronic care management and care coordination services  Consent to Services:  Ms. Flanagin was given information about Care Management services today including:  Care Management services includes personalized support from designated clinical staff supervised by her physician, including individualized plan of care and coordination with other care providers 24/7 contact phone numbers for assistance for urgent and routine care needs. The patient may stop case management services at any time by phone call to the office staff.  Patient agreed to services and consent obtained.   Summary: Patient has completed all goals She has a PCS aide that comes to her home 3 hours per day for 5 days a week. No new needs identified during this encounter.  CCM LCSW also collaborated with CCM RN during this encounter .  Marland Kitchen  See Care Plan below for interventions and patient self-care actives.  No Follow up scheduled:  All care plan goals have been met. Will disconnect from care team after this encounter. Patient has been informed to contact the office if new needs arise. Patient does not require continued follow-up by CCM LCSW. Will contact the office if needed  Assessment: Review of patient past medical history, allergies, medications, and health status, including review of relevant consultants reports was performed today as part of a comprehensive evaluation and provision of chronic care management and care coordination services.  SDOH (Social Determinants of Health) assessments and interventions  performed:    Advanced Directives Status: Not addressed in this encounter.  Care Plan Conditions to be addressed/monitored: ; Level of care concerns  Care Plan : LCSW Plan of Care  Updates made by Maurine Cane, LCSW since 12/26/2021 12:00 AM     Problem: Quality of Life      Goal: Quality of Life Maintained with [place] Completed 12/26/2021  Start Date: 10/24/2021  This Visit's Progress: On track  Recent Progress: On track  Priority: High  Note:   Current Barriers:  Level of Care Concerns:Inability to perform ADL's independently Lacks knowledge of how to connect   [place]):  Patient  will patient will work with SW to address concerns related to ADL's  through collaboration with [place] representative, provider, and care team.   Interventions: Inter-disciplinary care team collaboration (see longitudinal plan of care) Evaluation of current treatment plan related to  self management and patient's adherence to plan as established by provider  Level of Care Concerns in a patient with CKD Stage , Stroke & Diabetes:  (Status: Goal on Track (progressing): YES. Goal Met.) Current level of care: home with other family or significant other(s): family member: brother Evaluation of patient's unmet needs in current living environment ADL's Has Aide 5 days a week for 3 hours each day  Task & activities to accomplish goals: Return calls from [facility] or call them directly if you have questions [phone] or [place], [place] Licensed Clinical Social Worker Dossie Arbour Management  [place]  [phone]

## 2021-12-26 NOTE — Chronic Care Management (AMB) (Signed)
Care Management    RN Visit Note  12/26/2021 Name: Mary Valencia MRN: [id number] DOB: Jun 24, 1956  Subjective: Mary Valencia is a 66 y.o. year old female who is a primary care patient of Mary Valencia, Evie Lacks, MD. The care management team was consulted for assistance with disease management and care coordination needs.    Collaboration with Clinic Care Management CSW  for  RN CM case closure  in response to provider referral for case management and/or care coordination services.   Consent to Services:   [name] was given information about Care Management services 10/13/21 including:  Care Management services includes personalized support from designated clinical staff supervised by her physician, including individualized plan of care and coordination with other care providers 24/7 contact phone numbers for assistance for urgent and routine care needs. The patient may stop case management services at any time by phone call to the office staff.  Patient agreed to services and consent obtained.   Assessment: Review of patient past medical history, allergies, medications, health status, including review of consultants reports, laboratory and other test data, was performed as part of comprehensive evaluation and provision of chronic care management services.  are Plan  Allergies  Allergen Reactions   Diltiazem Swelling    EDEMA    Iodinated Contrast Media Hives   Betadine [Povidone Iodine] Other (See Comments)    Pt says it burns   Clonidine Derivatives     Dropped BP   Gabapentin     A bad reaction - confused   Iodine Hives   Penicillins Swelling   Sulfasalazine Hives   Sulfonamide Derivatives Hives   Tetracyclines & Related Nausea And Vomiting   Ceftriaxone Itching   Ciprofloxacin Itching   Naproxen Rash   Outpatient Encounter Medications as of 12/26/2021  Medication Sig Note   Alcohol Swabs (B-D SINGLE USE SWABS REGULAR) PADS Use as directed to clean site to check blood  sugars    ASPIRIN LOW DOSE 81 MG tablet NEW PRESCRIPTION REQUEST: Aspirin 81 MG Tablet,delayed Release - TAKE ONE TABLET BY MOUTH DAILY    atorvastatin (LIPITOR) 40 MG tablet NEW PRESCRIPTION REQUEST: Atorvastatin 40 MG Tablet - TAKE ONE TABLET BY MOUTH DAILY    calcitRIOL (ROCALTROL) 0.5 MCG capsule Take by mouth. TAKE 1 BY MOUTH DAILY    Continuous Blood Gluc Sensor (FREESTYLE LIBRE 2 SENSOR) MISC Use as directed to check blood sugars daily    diphenhydrAMINE (BENADRYL) 25 mg capsule Take 25 mg by mouth as needed. 10/06/2015: Received from: [place]: Take 25 mg by mouth.   glucose blood test strip TEST BLOOD GLUCOSE THREE TIMES DAILY    hydrALAZINE (APRESOLINE) 50 MG tablet NEW PRESCRIPTION REQUEST: Hydralazine 50 MG Tablet - TAKE TWO TABLETS BY MOUTH TWICE DAILY    insulin NPH Human (NOVOLIN N) 100 UNIT/ML injection Inject into the skin. Inject 8 Units into the skin 2 times daily with meals    losartan (COZAAR) 100 MG tablet Take 1 tablet (100 mg total) by mouth 2 (two) times daily.    mupirocin cream (BACTROBAN) 2 % Apply 1 application topically daily.    NIFEdipine (ADALAT CC) 90 MG 24 hr tablet NEW PRESCRIPTION REQUEST: Nifedipine Er 90 MG Tablet,extended Release - TAKE ONE TABLET BY MOUTH DAILY    NIFEdipine (PROCARDIA XL/NIFEDICAL-XL) 90 MG 24 hr tablet     omeprazole (PRILOSEC) 40 MG capsule NEW PRESCRIPTION REQUEST: Omeprazole 40 MG Capsule,delayed Release - TAKE ONE CAPSULE BY MOUTH DAILY  polyethylene glycol (MIRALAX / GLYCOLAX) packet Take 17 g by mouth.    PRESCRIPTION MEDICATION 210 mg 3 (three) times daily before meals.    senna-docusate (SENOKOT-S) 8.6-50 MG tablet Take by mouth 2 (two) times daily. Take 2 tab twice daily.    VELPHORO 500 MG chewable tablet CHEW 1 THREE TIMES A DAY WITH MEALS    No facility-administered encounter medications on file as of 12/26/2021.   Patient Active Problem List   Diagnosis Date Noted   CAP (community acquired pneumonia)  11/29/2021   Decreased functional mobility and endurance 09/20/2021   ESRD on hemodialysis ([place]) 09/20/2021   Hx of transient ischemic attack (TIA) 09/20/2021   Pressure ulcer of coccygeal region, stage 1 08/06/2021   Grieving 10/12/2020   Shoulder pain, right 10/11/2020   Hematoma and contusion 08/23/2020   Intertrigo 04/06/2020   Pressure sore 04/06/2020   Acute respiratory failure due to COVID-19 ([place]) 04/05/2020   Foot ulcer ([place]) 12/24/2019   Ankle sprain 12/24/2019   Hypomagnesemia 10/12/2019   History of cardioembolic cerebrovascular accident (CVA) 09/22/2019   Daytime somnolence 09/09/2019   Laceration of right lower leg 02/26/2019   History of colon polyps 11/12/2017   Liver cirrhosis ([place]) 09/21/2017   CAD (coronary artery disease) 09/21/2017   Arthritis 05/21/2017   Blurred vision, left eye 05/21/2017   Glomerulonephritis 05/21/2017   H/O unilateral nephrectomy 05/21/2017   Hx of gastric ulcer 05/21/2017   Neuropathy of both feet 05/21/2017   Situational anxiety 05/21/2017   Positive cardiac stress test 02/22/2017   Pre-transplant evaluation for kidney transplant 12/25/2016   Type 2 diabetes mellitus with hyperosmolar nonketotic hyperglycemia ([place]) 12/19/2016   UTI (urinary tract infection) 12/19/2016   Anemia 11/01/2016   Diarrhea 11/01/2016   Volume overload 03/31/2016   AKI (acute kidney injury) ([place]) 03/14/2016   ESRD (end stage renal disease) on dialysis ([place]) 10/06/2015   Situational mixed anxiety and depressive disorder 10/06/2015   Iron deficiency anemia 10/03/2015   Acquired solitary kidney 60/73/7106   Metabolic bone disease 26/94/8546   Vitamin D deficiency 09/03/2015   Hyperkalemia 02/01/2013   Edema 10/27/2012   TIA (transient ischemic attack) 03/21/2012   HLD (hyperlipidemia) 02/16/2012   Encephalopathy acute 01/29/2012   Cerebral infarction, unspecified ([place]) 01/25/2012   Weight loss 01/24/2012   LBP (low back pain) 01/24/2012   Chest pain,  atypical 09/25/2011   Vagina bleeding 01/18/2011   ALLERGIC RHINITIS 09/05/2010   TACHYCARDIA 09/05/2010   DYSPNEA 09/05/2010   Diabetes mellitus type 2 with complications ([place]) 27/08/5007   Dyslipidemia 02/19/2007   Renal hypertension 02/19/2007   GERD 02/19/2007   Essential hypertension 02/19/2007   Conditions to be addressed/monitored: DMII and ESRD  Care Plan : RN Care Manager Plan of Care  Updates made by Knox Royalty, RN since 12/26/2021 12:00 AM     Problem: Chronic Disease Management Needs   Priority: High     Long-Range Goal: Ongoing adherence to established plan of care for long term chronic disease management   Start Date: 10/25/2021  Expected End Date: 10/26/2022  Priority: High  Note:   Current Barriers:  Chronic Disease Management support and education needs related to DMII and ESRD Need for assistance with ADL's and iADL's-- Clinic CSW currently active and assisting patient-- she would like to have personal care services initiated 12/26/21- confirmed with CCM CSW that patient now has established personal care services in place Moderate food insecurity: community resource care guide referral placed 12/01/21: patient confirms she has  communicated with [employer] 10/26/21; denies additional needs today Need for clinic pharmacy support with medication optimization; assistance with CGM supplies: Clinic pharmacy referral placed Recent unplanned hospitalization [facility]) May 11-15, 2023 for N/V, DKA; CAP: discharged home to self-care with home health services through [facility]):  Patient will demonstrate ongoing health management independence as evidenced by adherence to plan of care for DMII and ESRD- on peritoneal dialysis        through collaboration with RN Care manager, provider, and care team.   Interventions: 1:1 collaboration with primary care provider regarding development and update of comprehensive plan of care as evidenced by  provider attestation and co-signature Inter-disciplinary care team collaboration (see longitudinal plan of care) Evaluation of current treatment plan related to  self management and patient's adherence to plan as established by provider Review of patient status, including review of consultants reports, relevant laboratory and other test results, and medications completed 12/26/21: Care coordination/ collaboration outreach from [place]; Neoma Laming confirmed today that she contacted patient and confirmed personal care services now in place; confirmed no ongoing care coordination/ care management needs; CCM RN CM case closure accordingly  Diabetes/ ESRD with peritoneal dialysis:  (Status: 12/26/21: Goal Met.) Long Term Goal      Plan:  No further follow up required:  collaboration with Clinic care Management CSW who confirmed today that patient denies ongoing care coordination/ care management needs; clinic RN CM case closure accordingly  Oneta Rack, RN, BSN, [place] [phone]: direct office

## 2021-12-26 NOTE — Patient Instructions (Addendum)
Visit Information  Congratulations on achieving your goals! It was a pleasure working with you, and I hope you continue to make great strides in improving your health.  No Follow up Scheduled:  You do not require continued follow-up by care coordination team Per our conversation, Richarda Osmond and I will disconnect from your care team at this time Please contact the office if needed  Following are the goals we discussed today: personal care services Task & activities to accomplish goals: Return calls from [facility] or call them directly if you have questions [phone] or [place], [place] Licensed Clinical Social Worker Dossie Arbour Management  [place]  [phone]   Please call the care guide team at [phone] if you need to cancel or reschedule your appointment.   If you are experiencing a Mental Health or [place] or need someone to talk to, please call [phone]-TALK (toll free, 24 hour hotline)   The patient verbalized understanding of instructions, educational materials, and care plan provided today and agreed to receive a mailed copy of patient instructions, educational materials, and care plan.

## 2021-12-27 DIAGNOSIS — N186 End stage renal disease: Secondary | ICD-10-CM | POA: Diagnosis not present

## 2021-12-27 DIAGNOSIS — N2581 Secondary hyperparathyroidism of renal origin: Secondary | ICD-10-CM | POA: Diagnosis not present

## 2021-12-27 DIAGNOSIS — D631 Anemia in chronic kidney disease: Secondary | ICD-10-CM | POA: Diagnosis not present

## 2021-12-28 ENCOUNTER — Ambulatory Visit
Admission: RE | Admit: 2021-12-28 | Discharge: 2021-12-28 | Disposition: A | Discharge status: Home / Self Care | Payer: Medicare Other | Source: Ambulatory Visit | Attending: Internal Medicine | Admitting: Internal Medicine

## 2021-12-28 DIAGNOSIS — Z780 Asymptomatic menopausal state: Secondary | ICD-10-CM

## 2021-12-28 DIAGNOSIS — N186 End stage renal disease: Secondary | ICD-10-CM | POA: Diagnosis not present

## 2021-12-28 DIAGNOSIS — D631 Anemia in chronic kidney disease: Secondary | ICD-10-CM | POA: Diagnosis not present

## 2021-12-28 DIAGNOSIS — M810 Age-related osteoporosis without current pathological fracture: Secondary | ICD-10-CM | POA: Diagnosis not present

## 2021-12-28 DIAGNOSIS — N2581 Secondary hyperparathyroidism of renal origin: Secondary | ICD-10-CM | POA: Diagnosis not present

## 2021-12-29 DIAGNOSIS — Z992 Dependence on renal dialysis: Secondary | ICD-10-CM | POA: Diagnosis not present

## 2021-12-29 DIAGNOSIS — I129 Hypertensive chronic kidney disease with stage 1 through stage 4 chronic kidney disease, or unspecified chronic kidney disease: Secondary | ICD-10-CM | POA: Diagnosis not present

## 2021-12-29 DIAGNOSIS — E1122 Type 2 diabetes mellitus with diabetic chronic kidney disease: Secondary | ICD-10-CM | POA: Diagnosis not present

## 2021-12-29 DIAGNOSIS — E559 Vitamin D deficiency, unspecified: Secondary | ICD-10-CM | POA: Diagnosis not present

## 2021-12-29 DIAGNOSIS — E785 Hyperlipidemia, unspecified: Secondary | ICD-10-CM | POA: Diagnosis not present

## 2021-12-29 DIAGNOSIS — Z8616 Personal history of COVID-19: Secondary | ICD-10-CM | POA: Diagnosis not present

## 2021-12-29 DIAGNOSIS — G8929 Other chronic pain: Secondary | ICD-10-CM | POA: Diagnosis not present

## 2021-12-29 DIAGNOSIS — Z8673 Personal history of transient ischemic attack (TIA), and cerebral infarction without residual deficits: Secondary | ICD-10-CM | POA: Diagnosis not present

## 2021-12-29 DIAGNOSIS — Z794 Long term (current) use of insulin: Secondary | ICD-10-CM | POA: Diagnosis not present

## 2021-12-29 DIAGNOSIS — Z905 Acquired absence of kidney: Secondary | ICD-10-CM | POA: Diagnosis not present

## 2021-12-29 DIAGNOSIS — Z951 Presence of aortocoronary bypass graft: Secondary | ICD-10-CM | POA: Diagnosis not present

## 2021-12-29 DIAGNOSIS — E1140 Type 2 diabetes mellitus with diabetic neuropathy, unspecified: Secondary | ICD-10-CM | POA: Diagnosis not present

## 2021-12-29 DIAGNOSIS — E46 Unspecified protein-calorie malnutrition: Secondary | ICD-10-CM | POA: Diagnosis not present

## 2021-12-29 DIAGNOSIS — N186 End stage renal disease: Secondary | ICD-10-CM | POA: Diagnosis not present

## 2021-12-29 DIAGNOSIS — Z9089 Acquired absence of other organs: Secondary | ICD-10-CM | POA: Diagnosis not present

## 2021-12-29 DIAGNOSIS — N2581 Secondary hyperparathyroidism of renal origin: Secondary | ICD-10-CM | POA: Diagnosis not present

## 2021-12-29 DIAGNOSIS — D631 Anemia in chronic kidney disease: Secondary | ICD-10-CM | POA: Diagnosis not present

## 2021-12-29 DIAGNOSIS — K219 Gastro-esophageal reflux disease without esophagitis: Secondary | ICD-10-CM | POA: Diagnosis not present

## 2021-12-29 DIAGNOSIS — Z7982 Long term (current) use of aspirin: Secondary | ICD-10-CM | POA: Diagnosis not present

## 2021-12-29 DIAGNOSIS — I2510 Atherosclerotic heart disease of native coronary artery without angina pectoris: Secondary | ICD-10-CM | POA: Diagnosis not present

## 2021-12-29 DIAGNOSIS — M25511 Pain in right shoulder: Secondary | ICD-10-CM | POA: Diagnosis not present

## 2021-12-29 DIAGNOSIS — Z9181 History of falling: Secondary | ICD-10-CM | POA: Diagnosis not present

## 2021-12-29 DIAGNOSIS — E1110 Type 2 diabetes mellitus with ketoacidosis without coma: Secondary | ICD-10-CM | POA: Diagnosis not present

## 2021-12-29 DIAGNOSIS — Z993 Dependence on wheelchair: Secondary | ICD-10-CM | POA: Diagnosis not present

## 2021-12-30 DIAGNOSIS — D631 Anemia in chronic kidney disease: Secondary | ICD-10-CM | POA: Diagnosis not present

## 2021-12-30 DIAGNOSIS — N186 End stage renal disease: Secondary | ICD-10-CM | POA: Diagnosis not present

## 2021-12-30 DIAGNOSIS — N2581 Secondary hyperparathyroidism of renal origin: Secondary | ICD-10-CM | POA: Diagnosis not present

## 2021-12-31 DIAGNOSIS — N2581 Secondary hyperparathyroidism of renal origin: Secondary | ICD-10-CM | POA: Diagnosis not present

## 2021-12-31 DIAGNOSIS — D631 Anemia in chronic kidney disease: Secondary | ICD-10-CM | POA: Diagnosis not present

## 2021-12-31 DIAGNOSIS — N186 End stage renal disease: Secondary | ICD-10-CM | POA: Diagnosis not present

## 2022-01-01 DIAGNOSIS — N2581 Secondary hyperparathyroidism of renal origin: Secondary | ICD-10-CM | POA: Diagnosis not present

## 2022-01-01 DIAGNOSIS — N186 End stage renal disease: Secondary | ICD-10-CM | POA: Diagnosis not present

## 2022-01-01 DIAGNOSIS — D631 Anemia in chronic kidney disease: Secondary | ICD-10-CM | POA: Diagnosis not present

## 2022-01-02 DIAGNOSIS — N186 End stage renal disease: Secondary | ICD-10-CM | POA: Diagnosis not present

## 2022-01-02 DIAGNOSIS — D631 Anemia in chronic kidney disease: Secondary | ICD-10-CM | POA: Diagnosis not present

## 2022-01-02 DIAGNOSIS — N2581 Secondary hyperparathyroidism of renal origin: Secondary | ICD-10-CM | POA: Diagnosis not present

## 2022-01-03 DIAGNOSIS — N186 End stage renal disease: Secondary | ICD-10-CM | POA: Diagnosis not present

## 2022-01-03 DIAGNOSIS — N2581 Secondary hyperparathyroidism of renal origin: Secondary | ICD-10-CM | POA: Diagnosis not present

## 2022-01-03 DIAGNOSIS — D631 Anemia in chronic kidney disease: Secondary | ICD-10-CM | POA: Diagnosis not present

## 2022-01-04 DIAGNOSIS — N186 End stage renal disease: Secondary | ICD-10-CM | POA: Diagnosis not present

## 2022-01-04 DIAGNOSIS — N2581 Secondary hyperparathyroidism of renal origin: Secondary | ICD-10-CM | POA: Diagnosis not present

## 2022-01-04 DIAGNOSIS — D631 Anemia in chronic kidney disease: Secondary | ICD-10-CM | POA: Diagnosis not present

## 2022-01-05 DIAGNOSIS — D631 Anemia in chronic kidney disease: Secondary | ICD-10-CM | POA: Diagnosis not present

## 2022-01-05 DIAGNOSIS — N186 End stage renal disease: Secondary | ICD-10-CM | POA: Diagnosis not present

## 2022-01-05 DIAGNOSIS — N2581 Secondary hyperparathyroidism of renal origin: Secondary | ICD-10-CM | POA: Diagnosis not present

## 2022-01-06 DIAGNOSIS — N2581 Secondary hyperparathyroidism of renal origin: Secondary | ICD-10-CM | POA: Diagnosis not present

## 2022-01-06 DIAGNOSIS — N186 End stage renal disease: Secondary | ICD-10-CM | POA: Diagnosis not present

## 2022-01-06 DIAGNOSIS — D631 Anemia in chronic kidney disease: Secondary | ICD-10-CM | POA: Diagnosis not present

## 2022-01-07 DIAGNOSIS — N2581 Secondary hyperparathyroidism of renal origin: Secondary | ICD-10-CM | POA: Diagnosis not present

## 2022-01-07 DIAGNOSIS — D631 Anemia in chronic kidney disease: Secondary | ICD-10-CM | POA: Diagnosis not present

## 2022-01-07 DIAGNOSIS — N186 End stage renal disease: Secondary | ICD-10-CM | POA: Diagnosis not present

## 2022-01-08 DIAGNOSIS — N186 End stage renal disease: Secondary | ICD-10-CM | POA: Diagnosis not present

## 2022-01-08 DIAGNOSIS — Z794 Long term (current) use of insulin: Secondary | ICD-10-CM | POA: Diagnosis not present

## 2022-01-08 DIAGNOSIS — N2581 Secondary hyperparathyroidism of renal origin: Secondary | ICD-10-CM | POA: Diagnosis not present

## 2022-01-08 DIAGNOSIS — E119 Type 2 diabetes mellitus without complications: Secondary | ICD-10-CM | POA: Diagnosis not present

## 2022-01-08 DIAGNOSIS — D631 Anemia in chronic kidney disease: Secondary | ICD-10-CM | POA: Diagnosis not present

## 2022-01-09 DIAGNOSIS — N186 End stage renal disease: Secondary | ICD-10-CM | POA: Diagnosis not present

## 2022-01-09 DIAGNOSIS — D631 Anemia in chronic kidney disease: Secondary | ICD-10-CM | POA: Diagnosis not present

## 2022-01-09 DIAGNOSIS — N2581 Secondary hyperparathyroidism of renal origin: Secondary | ICD-10-CM | POA: Diagnosis not present

## 2022-01-10 DIAGNOSIS — N2581 Secondary hyperparathyroidism of renal origin: Secondary | ICD-10-CM | POA: Diagnosis not present

## 2022-01-10 DIAGNOSIS — D631 Anemia in chronic kidney disease: Secondary | ICD-10-CM | POA: Diagnosis not present

## 2022-01-10 DIAGNOSIS — N186 End stage renal disease: Secondary | ICD-10-CM | POA: Diagnosis not present

## 2022-01-11 DIAGNOSIS — N2581 Secondary hyperparathyroidism of renal origin: Secondary | ICD-10-CM | POA: Diagnosis not present

## 2022-01-11 DIAGNOSIS — D631 Anemia in chronic kidney disease: Secondary | ICD-10-CM | POA: Diagnosis not present

## 2022-01-11 DIAGNOSIS — N186 End stage renal disease: Secondary | ICD-10-CM | POA: Diagnosis not present

## 2022-01-12 DIAGNOSIS — N2581 Secondary hyperparathyroidism of renal origin: Secondary | ICD-10-CM | POA: Diagnosis not present

## 2022-01-12 DIAGNOSIS — N186 End stage renal disease: Secondary | ICD-10-CM | POA: Diagnosis not present

## 2022-01-12 DIAGNOSIS — D631 Anemia in chronic kidney disease: Secondary | ICD-10-CM | POA: Diagnosis not present

## 2022-01-13 DIAGNOSIS — D631 Anemia in chronic kidney disease: Secondary | ICD-10-CM | POA: Diagnosis not present

## 2022-01-13 DIAGNOSIS — N186 End stage renal disease: Secondary | ICD-10-CM | POA: Diagnosis not present

## 2022-01-13 DIAGNOSIS — N2581 Secondary hyperparathyroidism of renal origin: Secondary | ICD-10-CM | POA: Diagnosis not present

## 2022-01-14 DIAGNOSIS — N186 End stage renal disease: Secondary | ICD-10-CM | POA: Diagnosis not present

## 2022-01-14 DIAGNOSIS — D631 Anemia in chronic kidney disease: Secondary | ICD-10-CM | POA: Diagnosis not present

## 2022-01-14 DIAGNOSIS — N2581 Secondary hyperparathyroidism of renal origin: Secondary | ICD-10-CM | POA: Diagnosis not present

## 2022-01-15 DIAGNOSIS — D631 Anemia in chronic kidney disease: Secondary | ICD-10-CM | POA: Diagnosis not present

## 2022-01-15 DIAGNOSIS — N186 End stage renal disease: Secondary | ICD-10-CM | POA: Diagnosis not present

## 2022-01-15 DIAGNOSIS — N2581 Secondary hyperparathyroidism of renal origin: Secondary | ICD-10-CM | POA: Diagnosis not present

## 2022-01-16 DIAGNOSIS — D631 Anemia in chronic kidney disease: Secondary | ICD-10-CM | POA: Diagnosis not present

## 2022-01-16 DIAGNOSIS — N186 End stage renal disease: Secondary | ICD-10-CM | POA: Diagnosis not present

## 2022-01-16 DIAGNOSIS — N2581 Secondary hyperparathyroidism of renal origin: Secondary | ICD-10-CM | POA: Diagnosis not present

## 2022-01-17 ENCOUNTER — Telehealth: Payer: Medicare Other

## 2022-01-17 DIAGNOSIS — N2581 Secondary hyperparathyroidism of renal origin: Secondary | ICD-10-CM | POA: Diagnosis not present

## 2022-01-17 DIAGNOSIS — N186 End stage renal disease: Secondary | ICD-10-CM | POA: Diagnosis not present

## 2022-01-17 DIAGNOSIS — D631 Anemia in chronic kidney disease: Secondary | ICD-10-CM | POA: Diagnosis not present

## 2022-01-18 DIAGNOSIS — D631 Anemia in chronic kidney disease: Secondary | ICD-10-CM | POA: Diagnosis not present

## 2022-01-18 DIAGNOSIS — N2581 Secondary hyperparathyroidism of renal origin: Secondary | ICD-10-CM | POA: Diagnosis not present

## 2022-01-18 DIAGNOSIS — N186 End stage renal disease: Secondary | ICD-10-CM | POA: Diagnosis not present

## 2022-01-19 DIAGNOSIS — N186 End stage renal disease: Secondary | ICD-10-CM | POA: Diagnosis not present

## 2022-01-19 DIAGNOSIS — D631 Anemia in chronic kidney disease: Secondary | ICD-10-CM | POA: Diagnosis not present

## 2022-01-19 DIAGNOSIS — N2581 Secondary hyperparathyroidism of renal origin: Secondary | ICD-10-CM | POA: Diagnosis not present

## 2022-01-20 DIAGNOSIS — N2581 Secondary hyperparathyroidism of renal origin: Secondary | ICD-10-CM | POA: Diagnosis not present

## 2022-01-20 DIAGNOSIS — N186 End stage renal disease: Secondary | ICD-10-CM | POA: Diagnosis not present

## 2022-01-20 DIAGNOSIS — D631 Anemia in chronic kidney disease: Secondary | ICD-10-CM | POA: Diagnosis not present

## 2022-01-21 DIAGNOSIS — D631 Anemia in chronic kidney disease: Secondary | ICD-10-CM | POA: Diagnosis not present

## 2022-01-21 DIAGNOSIS — N186 End stage renal disease: Secondary | ICD-10-CM | POA: Diagnosis not present

## 2022-01-21 DIAGNOSIS — N2581 Secondary hyperparathyroidism of renal origin: Secondary | ICD-10-CM | POA: Diagnosis not present

## 2022-01-22 DIAGNOSIS — D631 Anemia in chronic kidney disease: Secondary | ICD-10-CM | POA: Diagnosis not present

## 2022-01-22 DIAGNOSIS — N2581 Secondary hyperparathyroidism of renal origin: Secondary | ICD-10-CM | POA: Diagnosis not present

## 2022-01-22 DIAGNOSIS — N186 End stage renal disease: Secondary | ICD-10-CM | POA: Diagnosis not present

## 2022-01-23 ENCOUNTER — Ambulatory Visit: Payer: Medicaid Other | Admitting: Internal Medicine

## 2022-01-23 DIAGNOSIS — N186 End stage renal disease: Secondary | ICD-10-CM | POA: Diagnosis not present

## 2022-01-23 DIAGNOSIS — D631 Anemia in chronic kidney disease: Secondary | ICD-10-CM | POA: Diagnosis not present

## 2022-01-23 DIAGNOSIS — Z992 Dependence on renal dialysis: Secondary | ICD-10-CM | POA: Diagnosis not present

## 2022-01-23 DIAGNOSIS — N2581 Secondary hyperparathyroidism of renal origin: Secondary | ICD-10-CM | POA: Diagnosis not present

## 2022-01-24 DIAGNOSIS — N2581 Secondary hyperparathyroidism of renal origin: Secondary | ICD-10-CM | POA: Diagnosis not present

## 2022-01-24 DIAGNOSIS — N186 End stage renal disease: Secondary | ICD-10-CM | POA: Diagnosis not present

## 2022-01-24 DIAGNOSIS — D631 Anemia in chronic kidney disease: Secondary | ICD-10-CM | POA: Diagnosis not present

## 2022-01-24 DIAGNOSIS — Z23 Encounter for immunization: Secondary | ICD-10-CM | POA: Diagnosis not present

## 2022-01-25 DIAGNOSIS — Z23 Encounter for immunization: Secondary | ICD-10-CM | POA: Diagnosis not present

## 2022-01-25 DIAGNOSIS — N186 End stage renal disease: Secondary | ICD-10-CM | POA: Diagnosis not present

## 2022-01-25 DIAGNOSIS — D631 Anemia in chronic kidney disease: Secondary | ICD-10-CM | POA: Diagnosis not present

## 2022-01-25 DIAGNOSIS — N2581 Secondary hyperparathyroidism of renal origin: Secondary | ICD-10-CM | POA: Diagnosis not present

## 2022-01-26 DIAGNOSIS — N186 End stage renal disease: Secondary | ICD-10-CM | POA: Diagnosis not present

## 2022-01-26 DIAGNOSIS — Z23 Encounter for immunization: Secondary | ICD-10-CM | POA: Diagnosis not present

## 2022-01-26 DIAGNOSIS — N2581 Secondary hyperparathyroidism of renal origin: Secondary | ICD-10-CM | POA: Diagnosis not present

## 2022-01-26 DIAGNOSIS — D631 Anemia in chronic kidney disease: Secondary | ICD-10-CM | POA: Diagnosis not present

## 2022-01-27 DIAGNOSIS — Z23 Encounter for immunization: Secondary | ICD-10-CM | POA: Diagnosis not present

## 2022-01-27 DIAGNOSIS — D631 Anemia in chronic kidney disease: Secondary | ICD-10-CM | POA: Diagnosis not present

## 2022-01-27 DIAGNOSIS — N186 End stage renal disease: Secondary | ICD-10-CM | POA: Diagnosis not present

## 2022-01-27 DIAGNOSIS — N2581 Secondary hyperparathyroidism of renal origin: Secondary | ICD-10-CM | POA: Diagnosis not present

## 2022-01-28 DIAGNOSIS — Z23 Encounter for immunization: Secondary | ICD-10-CM | POA: Diagnosis not present

## 2022-01-28 DIAGNOSIS — N186 End stage renal disease: Secondary | ICD-10-CM | POA: Diagnosis not present

## 2022-01-28 DIAGNOSIS — D631 Anemia in chronic kidney disease: Secondary | ICD-10-CM | POA: Diagnosis not present

## 2022-01-28 DIAGNOSIS — N2581 Secondary hyperparathyroidism of renal origin: Secondary | ICD-10-CM | POA: Diagnosis not present

## 2022-01-29 DIAGNOSIS — D631 Anemia in chronic kidney disease: Secondary | ICD-10-CM | POA: Diagnosis not present

## 2022-01-29 DIAGNOSIS — Z23 Encounter for immunization: Secondary | ICD-10-CM | POA: Diagnosis not present

## 2022-01-29 DIAGNOSIS — N186 End stage renal disease: Secondary | ICD-10-CM | POA: Diagnosis not present

## 2022-01-29 DIAGNOSIS — N2581 Secondary hyperparathyroidism of renal origin: Secondary | ICD-10-CM | POA: Diagnosis not present

## 2022-01-30 DIAGNOSIS — N186 End stage renal disease: Secondary | ICD-10-CM | POA: Diagnosis not present

## 2022-01-30 DIAGNOSIS — D631 Anemia in chronic kidney disease: Secondary | ICD-10-CM | POA: Diagnosis not present

## 2022-01-30 DIAGNOSIS — N2581 Secondary hyperparathyroidism of renal origin: Secondary | ICD-10-CM | POA: Diagnosis not present

## 2022-01-30 DIAGNOSIS — Z23 Encounter for immunization: Secondary | ICD-10-CM | POA: Diagnosis not present

## 2022-01-31 DIAGNOSIS — N186 End stage renal disease: Secondary | ICD-10-CM | POA: Diagnosis not present

## 2022-01-31 DIAGNOSIS — N2581 Secondary hyperparathyroidism of renal origin: Secondary | ICD-10-CM | POA: Diagnosis not present

## 2022-01-31 DIAGNOSIS — D631 Anemia in chronic kidney disease: Secondary | ICD-10-CM | POA: Diagnosis not present

## 2022-01-31 DIAGNOSIS — Z23 Encounter for immunization: Secondary | ICD-10-CM | POA: Diagnosis not present

## 2022-02-01 DIAGNOSIS — Z23 Encounter for immunization: Secondary | ICD-10-CM | POA: Diagnosis not present

## 2022-02-01 DIAGNOSIS — N186 End stage renal disease: Secondary | ICD-10-CM | POA: Diagnosis not present

## 2022-02-01 DIAGNOSIS — N2581 Secondary hyperparathyroidism of renal origin: Secondary | ICD-10-CM | POA: Diagnosis not present

## 2022-02-01 DIAGNOSIS — E119 Type 2 diabetes mellitus without complications: Secondary | ICD-10-CM | POA: Diagnosis not present

## 2022-02-01 DIAGNOSIS — D631 Anemia in chronic kidney disease: Secondary | ICD-10-CM | POA: Diagnosis not present

## 2022-02-02 DIAGNOSIS — Z23 Encounter for immunization: Secondary | ICD-10-CM | POA: Diagnosis not present

## 2022-02-02 DIAGNOSIS — N186 End stage renal disease: Secondary | ICD-10-CM | POA: Diagnosis not present

## 2022-02-02 DIAGNOSIS — D631 Anemia in chronic kidney disease: Secondary | ICD-10-CM | POA: Diagnosis not present

## 2022-02-02 DIAGNOSIS — N2581 Secondary hyperparathyroidism of renal origin: Secondary | ICD-10-CM | POA: Diagnosis not present

## 2022-02-03 DIAGNOSIS — Z23 Encounter for immunization: Secondary | ICD-10-CM | POA: Diagnosis not present

## 2022-02-03 DIAGNOSIS — N186 End stage renal disease: Secondary | ICD-10-CM | POA: Diagnosis not present

## 2022-02-03 DIAGNOSIS — N2581 Secondary hyperparathyroidism of renal origin: Secondary | ICD-10-CM | POA: Diagnosis not present

## 2022-02-03 DIAGNOSIS — D631 Anemia in chronic kidney disease: Secondary | ICD-10-CM | POA: Diagnosis not present

## 2022-02-04 DIAGNOSIS — N2581 Secondary hyperparathyroidism of renal origin: Secondary | ICD-10-CM | POA: Diagnosis not present

## 2022-02-04 DIAGNOSIS — D631 Anemia in chronic kidney disease: Secondary | ICD-10-CM | POA: Diagnosis not present

## 2022-02-04 DIAGNOSIS — N186 End stage renal disease: Secondary | ICD-10-CM | POA: Diagnosis not present

## 2022-02-04 DIAGNOSIS — Z23 Encounter for immunization: Secondary | ICD-10-CM | POA: Diagnosis not present

## 2022-02-05 DIAGNOSIS — Z23 Encounter for immunization: Secondary | ICD-10-CM | POA: Diagnosis not present

## 2022-02-05 DIAGNOSIS — N2581 Secondary hyperparathyroidism of renal origin: Secondary | ICD-10-CM | POA: Diagnosis not present

## 2022-02-05 DIAGNOSIS — N186 End stage renal disease: Secondary | ICD-10-CM | POA: Diagnosis not present

## 2022-02-05 DIAGNOSIS — D631 Anemia in chronic kidney disease: Secondary | ICD-10-CM | POA: Diagnosis not present

## 2022-02-06 DIAGNOSIS — D631 Anemia in chronic kidney disease: Secondary | ICD-10-CM | POA: Diagnosis not present

## 2022-02-06 DIAGNOSIS — Z23 Encounter for immunization: Secondary | ICD-10-CM | POA: Diagnosis not present

## 2022-02-06 DIAGNOSIS — N186 End stage renal disease: Secondary | ICD-10-CM | POA: Diagnosis not present

## 2022-02-06 DIAGNOSIS — N2581 Secondary hyperparathyroidism of renal origin: Secondary | ICD-10-CM | POA: Diagnosis not present

## 2022-02-07 DIAGNOSIS — Z23 Encounter for immunization: Secondary | ICD-10-CM | POA: Diagnosis not present

## 2022-02-07 DIAGNOSIS — D631 Anemia in chronic kidney disease: Secondary | ICD-10-CM | POA: Diagnosis not present

## 2022-02-07 DIAGNOSIS — N2581 Secondary hyperparathyroidism of renal origin: Secondary | ICD-10-CM | POA: Diagnosis not present

## 2022-02-07 DIAGNOSIS — N186 End stage renal disease: Secondary | ICD-10-CM | POA: Diagnosis not present

## 2022-02-08 DIAGNOSIS — Z23 Encounter for immunization: Secondary | ICD-10-CM | POA: Diagnosis not present

## 2022-02-08 DIAGNOSIS — D631 Anemia in chronic kidney disease: Secondary | ICD-10-CM | POA: Diagnosis not present

## 2022-02-08 DIAGNOSIS — N2581 Secondary hyperparathyroidism of renal origin: Secondary | ICD-10-CM | POA: Diagnosis not present

## 2022-02-08 DIAGNOSIS — N186 End stage renal disease: Secondary | ICD-10-CM | POA: Diagnosis not present

## 2022-02-09 DIAGNOSIS — Z23 Encounter for immunization: Secondary | ICD-10-CM | POA: Diagnosis not present

## 2022-02-09 DIAGNOSIS — N2581 Secondary hyperparathyroidism of renal origin: Secondary | ICD-10-CM | POA: Diagnosis not present

## 2022-02-09 DIAGNOSIS — D631 Anemia in chronic kidney disease: Secondary | ICD-10-CM | POA: Diagnosis not present

## 2022-02-09 DIAGNOSIS — N186 End stage renal disease: Secondary | ICD-10-CM | POA: Diagnosis not present

## 2022-02-10 DIAGNOSIS — Z23 Encounter for immunization: Secondary | ICD-10-CM | POA: Diagnosis not present

## 2022-02-10 DIAGNOSIS — N186 End stage renal disease: Secondary | ICD-10-CM | POA: Diagnosis not present

## 2022-02-10 DIAGNOSIS — D631 Anemia in chronic kidney disease: Secondary | ICD-10-CM | POA: Diagnosis not present

## 2022-02-10 DIAGNOSIS — N2581 Secondary hyperparathyroidism of renal origin: Secondary | ICD-10-CM | POA: Diagnosis not present

## 2022-02-11 DIAGNOSIS — N2581 Secondary hyperparathyroidism of renal origin: Secondary | ICD-10-CM | POA: Diagnosis not present

## 2022-02-11 DIAGNOSIS — N186 End stage renal disease: Secondary | ICD-10-CM | POA: Diagnosis not present

## 2022-02-11 DIAGNOSIS — D631 Anemia in chronic kidney disease: Secondary | ICD-10-CM | POA: Diagnosis not present

## 2022-02-11 DIAGNOSIS — Z23 Encounter for immunization: Secondary | ICD-10-CM | POA: Diagnosis not present

## 2022-02-12 DIAGNOSIS — N2581 Secondary hyperparathyroidism of renal origin: Secondary | ICD-10-CM | POA: Diagnosis not present

## 2022-02-12 DIAGNOSIS — N186 End stage renal disease: Secondary | ICD-10-CM | POA: Diagnosis not present

## 2022-02-12 DIAGNOSIS — Z23 Encounter for immunization: Secondary | ICD-10-CM | POA: Diagnosis not present

## 2022-02-12 DIAGNOSIS — D631 Anemia in chronic kidney disease: Secondary | ICD-10-CM | POA: Diagnosis not present

## 2022-02-13 DIAGNOSIS — D631 Anemia in chronic kidney disease: Secondary | ICD-10-CM | POA: Diagnosis not present

## 2022-02-13 DIAGNOSIS — N2581 Secondary hyperparathyroidism of renal origin: Secondary | ICD-10-CM | POA: Diagnosis not present

## 2022-02-13 DIAGNOSIS — Z23 Encounter for immunization: Secondary | ICD-10-CM | POA: Diagnosis not present

## 2022-02-13 DIAGNOSIS — N186 End stage renal disease: Secondary | ICD-10-CM | POA: Diagnosis not present

## 2022-02-14 DIAGNOSIS — D631 Anemia in chronic kidney disease: Secondary | ICD-10-CM | POA: Diagnosis not present

## 2022-02-14 DIAGNOSIS — Z23 Encounter for immunization: Secondary | ICD-10-CM | POA: Diagnosis not present

## 2022-02-14 DIAGNOSIS — N2581 Secondary hyperparathyroidism of renal origin: Secondary | ICD-10-CM | POA: Diagnosis not present

## 2022-02-14 DIAGNOSIS — N186 End stage renal disease: Secondary | ICD-10-CM | POA: Diagnosis not present

## 2022-02-15 DIAGNOSIS — N2581 Secondary hyperparathyroidism of renal origin: Secondary | ICD-10-CM | POA: Diagnosis not present

## 2022-02-15 DIAGNOSIS — N186 End stage renal disease: Secondary | ICD-10-CM | POA: Diagnosis not present

## 2022-02-15 DIAGNOSIS — D631 Anemia in chronic kidney disease: Secondary | ICD-10-CM | POA: Diagnosis not present

## 2022-02-15 DIAGNOSIS — Z23 Encounter for immunization: Secondary | ICD-10-CM | POA: Diagnosis not present

## 2022-02-16 DIAGNOSIS — Z23 Encounter for immunization: Secondary | ICD-10-CM | POA: Diagnosis not present

## 2022-02-16 DIAGNOSIS — N2581 Secondary hyperparathyroidism of renal origin: Secondary | ICD-10-CM | POA: Diagnosis not present

## 2022-02-16 DIAGNOSIS — D631 Anemia in chronic kidney disease: Secondary | ICD-10-CM | POA: Diagnosis not present

## 2022-02-16 DIAGNOSIS — N186 End stage renal disease: Secondary | ICD-10-CM | POA: Diagnosis not present

## 2022-02-17 DIAGNOSIS — Z23 Encounter for immunization: Secondary | ICD-10-CM | POA: Diagnosis not present

## 2022-02-17 DIAGNOSIS — N186 End stage renal disease: Secondary | ICD-10-CM | POA: Diagnosis not present

## 2022-02-17 DIAGNOSIS — N2581 Secondary hyperparathyroidism of renal origin: Secondary | ICD-10-CM | POA: Diagnosis not present

## 2022-02-17 DIAGNOSIS — D631 Anemia in chronic kidney disease: Secondary | ICD-10-CM | POA: Diagnosis not present

## 2022-02-18 DIAGNOSIS — N186 End stage renal disease: Secondary | ICD-10-CM | POA: Diagnosis not present

## 2022-02-18 DIAGNOSIS — D631 Anemia in chronic kidney disease: Secondary | ICD-10-CM | POA: Diagnosis not present

## 2022-02-18 DIAGNOSIS — Z23 Encounter for immunization: Secondary | ICD-10-CM | POA: Diagnosis not present

## 2022-02-18 DIAGNOSIS — N2581 Secondary hyperparathyroidism of renal origin: Secondary | ICD-10-CM | POA: Diagnosis not present

## 2022-02-19 DIAGNOSIS — Z23 Encounter for immunization: Secondary | ICD-10-CM | POA: Diagnosis not present

## 2022-02-19 DIAGNOSIS — N186 End stage renal disease: Secondary | ICD-10-CM | POA: Diagnosis not present

## 2022-02-19 DIAGNOSIS — N2581 Secondary hyperparathyroidism of renal origin: Secondary | ICD-10-CM | POA: Diagnosis not present

## 2022-02-19 DIAGNOSIS — D631 Anemia in chronic kidney disease: Secondary | ICD-10-CM | POA: Diagnosis not present

## 2022-02-20 DIAGNOSIS — Z23 Encounter for immunization: Secondary | ICD-10-CM | POA: Diagnosis not present

## 2022-02-20 DIAGNOSIS — N2581 Secondary hyperparathyroidism of renal origin: Secondary | ICD-10-CM | POA: Diagnosis not present

## 2022-02-20 DIAGNOSIS — N186 End stage renal disease: Secondary | ICD-10-CM | POA: Diagnosis not present

## 2022-02-20 DIAGNOSIS — D631 Anemia in chronic kidney disease: Secondary | ICD-10-CM | POA: Diagnosis not present

## 2022-02-21 DIAGNOSIS — N186 End stage renal disease: Secondary | ICD-10-CM | POA: Diagnosis not present

## 2022-02-21 DIAGNOSIS — D631 Anemia in chronic kidney disease: Secondary | ICD-10-CM | POA: Diagnosis not present

## 2022-02-21 DIAGNOSIS — N2581 Secondary hyperparathyroidism of renal origin: Secondary | ICD-10-CM | POA: Diagnosis not present

## 2022-02-21 DIAGNOSIS — Z23 Encounter for immunization: Secondary | ICD-10-CM | POA: Diagnosis not present

## 2022-02-22 DIAGNOSIS — N2581 Secondary hyperparathyroidism of renal origin: Secondary | ICD-10-CM | POA: Diagnosis not present

## 2022-02-22 DIAGNOSIS — D631 Anemia in chronic kidney disease: Secondary | ICD-10-CM | POA: Diagnosis not present

## 2022-02-22 DIAGNOSIS — N186 End stage renal disease: Secondary | ICD-10-CM | POA: Diagnosis not present

## 2022-02-22 DIAGNOSIS — Z23 Encounter for immunization: Secondary | ICD-10-CM | POA: Diagnosis not present

## 2022-02-23 DIAGNOSIS — Z992 Dependence on renal dialysis: Secondary | ICD-10-CM | POA: Diagnosis not present

## 2022-02-23 DIAGNOSIS — D631 Anemia in chronic kidney disease: Secondary | ICD-10-CM | POA: Diagnosis not present

## 2022-02-23 DIAGNOSIS — N186 End stage renal disease: Secondary | ICD-10-CM | POA: Diagnosis not present

## 2022-02-23 DIAGNOSIS — Z23 Encounter for immunization: Secondary | ICD-10-CM | POA: Diagnosis not present

## 2022-02-23 DIAGNOSIS — N2581 Secondary hyperparathyroidism of renal origin: Secondary | ICD-10-CM | POA: Diagnosis not present

## 2022-02-24 ENCOUNTER — Ambulatory Visit (INDEPENDENT_AMBULATORY_CARE_PROVIDER_SITE_OTHER): Payer: Medicare Other | Admitting: Internal Medicine

## 2022-02-24 ENCOUNTER — Encounter: Payer: Self-pay | Admitting: Internal Medicine

## 2022-02-24 DIAGNOSIS — E118 Type 2 diabetes mellitus with unspecified complications: Secondary | ICD-10-CM | POA: Diagnosis not present

## 2022-02-24 DIAGNOSIS — N2581 Secondary hyperparathyroidism of renal origin: Secondary | ICD-10-CM | POA: Diagnosis not present

## 2022-02-24 DIAGNOSIS — N186 End stage renal disease: Secondary | ICD-10-CM | POA: Diagnosis not present

## 2022-02-24 DIAGNOSIS — Z992 Dependence on renal dialysis: Secondary | ICD-10-CM

## 2022-02-24 DIAGNOSIS — D631 Anemia in chronic kidney disease: Secondary | ICD-10-CM | POA: Diagnosis not present

## 2022-02-24 DIAGNOSIS — R634 Abnormal weight loss: Secondary | ICD-10-CM

## 2022-02-24 DIAGNOSIS — E162 Hypoglycemia, unspecified: Secondary | ICD-10-CM | POA: Diagnosis not present

## 2022-02-24 DIAGNOSIS — Z8673 Personal history of transient ischemic attack (TIA), and cerebral infarction without residual deficits: Secondary | ICD-10-CM | POA: Diagnosis not present

## 2022-02-24 DIAGNOSIS — Z23 Encounter for immunization: Secondary | ICD-10-CM | POA: Diagnosis not present

## 2022-02-24 MED ORDER — GVOKE HYPOPEN 1-PACK 1 MG/0.2ML ~~LOC~~ SOAJ: 0.2000 mL | Freq: Every day | SUBCUTANEOUS | 3 refills | Status: DC | PRN

## 2022-02-24 NOTE — Assessment & Plan Note (Signed)
Pt needs more PCS hours

## 2022-02-24 NOTE — Progress Notes (Signed)
Subjective:  Patient ID: Mary Valencia, female    DOB: 1955/07/05  Age: 66 y.o. MRN: [id number]  CC: 15mofollow up   HPI Mary HEITMANpresents for DM, CVA, HTN C/o one low CBG Pt needs more PCS hours  Outpatient Medications Prior to Visit  Medication Sig Dispense Refill   Alcohol Swabs (B-D SINGLE USE SWABS REGULAR) PADS Use as directed to clean site to check blood sugars 180 each 3   ASPIRIN LOW DOSE 81 MG tablet NEW PRESCRIPTION REQUEST: Aspirin 81 MG Tablet,delayed Release - TAKE ONE TABLET BY MOUTH DAILY 90 tablet 3   atorvastatin (LIPITOR) 40 MG tablet NEW PRESCRIPTION REQUEST: Atorvastatin 40 MG Tablet - TAKE ONE TABLET BY MOUTH DAILY 90 tablet 3   calcitRIOL (ROCALTROL) 0.5 MCG capsule Take by mouth. TAKE 1 BY MOUTH DAILY     Continuous Blood Gluc Sensor (FREESTYLE LIBRE 2 SENSOR) MISC Use as directed to check blood sugars daily 3 each 3   diphenhydrAMINE (BENADRYL) 25 mg capsule Take 25 mg by mouth as needed.     glucose blood test strip TEST BLOOD GLUCOSE THREE TIMES DAILY 300 each 3   hydrALAZINE (APRESOLINE) 50 MG tablet NEW PRESCRIPTION REQUEST: Hydralazine 50 MG Tablet - TAKE TWO TABLETS BY MOUTH TWICE DAILY 360 tablet 3   insulin NPH Human (NOVOLIN N) 100 UNIT/ML injection Inject into the skin. Inject 8 Units into the skin 2 times daily with meals     losartan (COZAAR) 100 MG tablet Take 1 tablet (100 mg total) by mouth 2 (two) times daily. 180 tablet 3   mupirocin cream (BACTROBAN) 2 % Apply 1 application topically daily. 100 g 3   NIFEdipine (ADALAT CC) 90 MG 24 hr tablet NEW PRESCRIPTION REQUEST: Nifedipine Er 90 MG Tablet,extended Release - TAKE ONE TABLET BY MOUTH DAILY 90 tablet 3   NIFEdipine (PROCARDIA XL/NIFEDICAL-XL) 90 MG 24 hr tablet      omeprazole (PRILOSEC) 40 MG capsule NEW PRESCRIPTION REQUEST: Omeprazole 40 MG Capsule,delayed Release - TAKE ONE CAPSULE BY MOUTH DAILY 90 capsule 3   polyethylene glycol (MIRALAX / GLYCOLAX) packet Take 17 g by mouth.      PRESCRIPTION MEDICATION 210 mg 3 (three) times daily before meals.     VELPHORO 500 MG chewable tablet CHEW 1 THREE TIMES A DAY WITH MEALS     senna-docusate (SENOKOT-S) 8.6-50 MG tablet Take by mouth 2 (two) times daily. Take 2 tab twice daily.     No facility-administered medications prior to visit.    ROS: Review of Systems  Constitutional:  Positive for fatigue. Negative for activity change, appetite change, chills and unexpected weight change.  HENT:  Negative for congestion, mouth sores and sinus pressure.   Eyes:  Negative for visual disturbance.  Respiratory:  Negative for cough and chest tightness.   Gastrointestinal:  Negative for abdominal pain and nausea.  Genitourinary:  Negative for difficulty urinating, frequency and vaginal pain.  Musculoskeletal:  Positive for arthralgias and gait problem. Negative for back pain.  Skin:  Negative for pallor and rash.  Neurological:  Positive for weakness. Negative for dizziness, tremors, numbness and headaches.  Psychiatric/Behavioral:  Negative for confusion, sleep disturbance and suicidal ideas.     Objective:  BP 120/74 (BP Location: Left Arm, Patient Position: Standing, Cuff Size: Large)   Pulse 70   Temp 98.2 F (36.8 C) (Oral)   Wt 136 lb (61.7 kg)   SpO2 93%   BMI 24.87 kg/m   BP Readings from  Last 3 Encounters:  02/24/22 120/74  11/29/21 118/78  09/20/21 100/68    Wt Readings from Last 3 Encounters:  02/24/22 136 lb (61.7 kg)  11/29/21 128 lb (58.1 kg)  09/20/21 138 lb (62.6 kg)    Physical Exam Constitutional:      General: She is not in acute distress.    Appearance: She is well-developed. She is obese.  HENT:     Head: Normocephalic.     Right Ear: External ear normal.     Left Ear: External ear normal.     Nose: Nose normal.  Eyes:     General:        Right eye: No discharge.        Left eye: No discharge.     Conjunctiva/sclera: Conjunctivae normal.     Pupils: Pupils are equal, round, and  reactive to light.  Neck:     Thyroid: No thyromegaly.     Vascular: No JVD.     Trachea: No tracheal deviation.  Cardiovascular:     Rate and Rhythm: Normal rate and regular rhythm.     Heart sounds: Normal heart sounds.  Pulmonary:     Effort: No respiratory distress.     Breath sounds: No stridor. No wheezing.  Abdominal:     General: Bowel sounds are normal. There is no distension.     Palpations: Abdomen is soft. There is no mass.     Tenderness: There is no abdominal tenderness. There is no guarding or rebound.  Musculoskeletal:        General: No tenderness.     Cervical back: Normal range of motion and neck supple. No rigidity.  Lymphadenopathy:     Cervical: No cervical adenopathy.  Skin:    Findings: No erythema or rash.  Neurological:     Mental Status: She is oriented to person, place, and time.     Cranial Nerves: No cranial nerve deficit.     Motor: Weakness present. No abnormal muscle tone.     Coordination: Coordination abnormal.     Gait: Gait abnormal.     Deep Tendon Reflexes: Reflexes normal.  Psychiatric:        Behavior: Behavior normal.        Thought Content: Thought content normal.        Judgment: Judgment normal.   In a w/c PD catheter  Lab Results  Component Value Date   WBC 7.7 08/29/2017   HGB 29.1 09/01/2017   HCT 30 (A) 08/29/2017   PLT 203 08/29/2017   GLUCOSE 279 (H) 09/21/2017   CHOL 161 09/21/2017   TRIG 72.0 09/21/2017   HDL 63.60 09/21/2017   LDLDIRECT 207.8 09/29/2010   LDLCALC 83 09/21/2017   ALT 6 09/21/2017   AST 21 09/21/2017   NA 141 09/21/2017   K 4.9 09/21/2017   CL 98 09/21/2017   CREATININE 13.49 (HH) 09/21/2017   BUN 66 (H) 09/21/2017   CO2 27 09/21/2017   TSH 2.81 09/21/2017   HGBA1C 12.6 (A) 06/16/2021   HGBA1C 12.6 06/16/2021   HGBA1C 12.6 (A) 06/16/2021   HGBA1C 12.6 (A) 06/16/2021    DG Bone Density  Result Date: 12/28/2021 EXAM: DUAL X-RAY ABSORPTIOMETRY (DXA) FOR BONE MINERAL DENSITY IMPRESSION:  Referring Physician:  Cassandria Anger Your patient completed a bone mineral density test using GE Lunar iDXA system (analysis version: 16). Technologist: Cando PATIENT: Name: Mary Valencia, Mary Valencia Patient ID: [id number] Birth Date: 07-16-1955 Height: 62.0 in. Sex: Female Measured: 12/28/2021  Weight: 128.0 lbs. Indications: Estrogen Deficient, Hysterectomy, Postmenopausal Fractures: NONE Treatments: Calcium (E943.0) ASSESSMENT: The BMD measured at Forearm Radius 33% is 0.605 g/cm2 with a T-score of -3.2. This patient is considered osteoporotic according to [facility]) criteria. The quality of the exam is limited by the scannable areas of the patient as the patient is in a motorized wheelchair and is unable to safely achieve and maintain DEXA postioning. Site Region Measured Date Measured Age YA BMD Significant CHANGE T-score Right Forearm Radius 33% 12/28/2019 67.5 -3.2 0.605 g/cm2 World Health Organization [facility]) criteria for post-menopausal, Caucasian Women: Normal       T-score at or above -1 SD Osteopenia   T-score between -1 and -2.5 SD Osteoporosis T-score at or below -2.5 SD RECOMMENDATION: 1. All patients should optimize calcium and vitamin D intake. 2. Consider FDA-approved medical therapies in postmenopausal women and men aged 60 years and older, based on the following: a. A hip or vertebral (clinical or morphometric) fracture. b. T-score = -2.5 at the femoral neck or spine after appropriate evaluation to exclude secondary causes. c. Low bone mass (T-score between -1.0 and -2.5 at the femoral neck or spine) and a 10-year probability of a hip fracture = 3% or a 10-year probability of a major osteoporosis-related fracture = 20% based on the [place]-adapted WHO algorithm. d. Clinician judgment and/or patient preferences may indicate treatment for people with 10-year fracture probabilities above or below these levels. FOLLOW-UP: Patients with diagnosis of osteoporosis or at high risk for fracture  should have regular bone mineral density tests.? Patients eligible for Medicare are allowed routine testing every 2 years.? The testing frequency can be increased to one year for patients who have rapidly progressing disease, are receiving or discontinuing medical therapy to restore bone mass, or have additional risk factors. I have reviewed this study and agree with the findings. [facility] Electronically Signed   By: Abelardo Diesel M.D.   On: 12/28/2021 10:48    Assessment & Plan:   Problem List Items Addressed This Visit     Diabetes mellitus type 2 with complications ([place])    Infrequent episodes of hypoglycemia Rx GVOKE pen prn given Using [place] 2      Relevant Medications   Glucagon (GVOKE HYPOPEN 1-PACK) 1 MG/0.2ML SOAJ   ESRD (end stage renal disease) on dialysis ([place])    On PD nightly. Labs monthly w/Nephrology      History of cardioembolic cerebrovascular accident (CVA)    Pt needs more PCS hours      Hypoglycemia    Infrequent episodes  Rx GVOKE pen prn given      Weight loss    Wt Readings from Last 3 Encounters:  02/24/22 136 lb (61.7 kg)  11/29/21 128 lb (58.1 kg)  09/20/21 138 lb (62.6 kg)  Resolved         Meds ordered this encounter  Medications   Glucagon (GVOKE HYPOPEN 1-PACK) 1 MG/0.2ML SOAJ    Sig: Inject 0.2 mLs into the skin daily as needed (Low glucose).    Dispense:  0.4 mL    Refill:  3      Follow-up: Return in about 3 months (around 05/26/2022) for a follow-up visit.  Walker Kehr, MD

## 2022-02-24 NOTE — Assessment & Plan Note (Signed)
Infrequent episodes of hypoglycemia Rx GVOKE pen prn given Using [place] 2

## 2022-02-24 NOTE — Assessment & Plan Note (Signed)
On PD nightly. Labs monthly w/Nephrology

## 2022-02-24 NOTE — Assessment & Plan Note (Signed)
Infrequent episodes  Rx GVOKE pen prn given

## 2022-02-24 NOTE — Assessment & Plan Note (Signed)
Wt Readings from Last 3 Encounters:  02/24/22 136 lb (61.7 kg)  11/29/21 128 lb (58.1 kg)  09/20/21 138 lb (62.6 kg)  Resolved

## 2022-02-25 DIAGNOSIS — D631 Anemia in chronic kidney disease: Secondary | ICD-10-CM | POA: Diagnosis not present

## 2022-02-25 DIAGNOSIS — N186 End stage renal disease: Secondary | ICD-10-CM | POA: Diagnosis not present

## 2022-02-25 DIAGNOSIS — N2581 Secondary hyperparathyroidism of renal origin: Secondary | ICD-10-CM | POA: Diagnosis not present

## 2022-02-25 DIAGNOSIS — Z23 Encounter for immunization: Secondary | ICD-10-CM | POA: Diagnosis not present

## 2022-02-26 DIAGNOSIS — D631 Anemia in chronic kidney disease: Secondary | ICD-10-CM | POA: Diagnosis not present

## 2022-02-26 DIAGNOSIS — N186 End stage renal disease: Secondary | ICD-10-CM | POA: Diagnosis not present

## 2022-02-26 DIAGNOSIS — N2581 Secondary hyperparathyroidism of renal origin: Secondary | ICD-10-CM | POA: Diagnosis not present

## 2022-02-26 DIAGNOSIS — Z23 Encounter for immunization: Secondary | ICD-10-CM | POA: Diagnosis not present

## 2022-02-27 DIAGNOSIS — D631 Anemia in chronic kidney disease: Secondary | ICD-10-CM | POA: Diagnosis not present

## 2022-02-27 DIAGNOSIS — N2581 Secondary hyperparathyroidism of renal origin: Secondary | ICD-10-CM | POA: Diagnosis not present

## 2022-02-27 DIAGNOSIS — Z23 Encounter for immunization: Secondary | ICD-10-CM | POA: Diagnosis not present

## 2022-02-27 DIAGNOSIS — N186 End stage renal disease: Secondary | ICD-10-CM | POA: Diagnosis not present

## 2022-02-28 DIAGNOSIS — D631 Anemia in chronic kidney disease: Secondary | ICD-10-CM | POA: Diagnosis not present

## 2022-02-28 DIAGNOSIS — N2581 Secondary hyperparathyroidism of renal origin: Secondary | ICD-10-CM | POA: Diagnosis not present

## 2022-02-28 DIAGNOSIS — Z794 Long term (current) use of insulin: Secondary | ICD-10-CM | POA: Diagnosis not present

## 2022-02-28 DIAGNOSIS — E119 Type 2 diabetes mellitus without complications: Secondary | ICD-10-CM | POA: Diagnosis not present

## 2022-02-28 DIAGNOSIS — Z23 Encounter for immunization: Secondary | ICD-10-CM | POA: Diagnosis not present

## 2022-02-28 DIAGNOSIS — N186 End stage renal disease: Secondary | ICD-10-CM | POA: Diagnosis not present

## 2022-03-01 ENCOUNTER — Ambulatory Visit: Payer: Medicare Other | Admitting: Internal Medicine

## 2022-03-01 DIAGNOSIS — Z23 Encounter for immunization: Secondary | ICD-10-CM | POA: Diagnosis not present

## 2022-03-01 DIAGNOSIS — D631 Anemia in chronic kidney disease: Secondary | ICD-10-CM | POA: Diagnosis not present

## 2022-03-01 DIAGNOSIS — N186 End stage renal disease: Secondary | ICD-10-CM | POA: Diagnosis not present

## 2022-03-01 DIAGNOSIS — N2581 Secondary hyperparathyroidism of renal origin: Secondary | ICD-10-CM | POA: Diagnosis not present

## 2022-03-02 DIAGNOSIS — D631 Anemia in chronic kidney disease: Secondary | ICD-10-CM | POA: Diagnosis not present

## 2022-03-02 DIAGNOSIS — Z23 Encounter for immunization: Secondary | ICD-10-CM | POA: Diagnosis not present

## 2022-03-02 DIAGNOSIS — N2581 Secondary hyperparathyroidism of renal origin: Secondary | ICD-10-CM | POA: Diagnosis not present

## 2022-03-02 DIAGNOSIS — N186 End stage renal disease: Secondary | ICD-10-CM | POA: Diagnosis not present

## 2022-03-03 DIAGNOSIS — Z23 Encounter for immunization: Secondary | ICD-10-CM | POA: Diagnosis not present

## 2022-03-03 DIAGNOSIS — N2581 Secondary hyperparathyroidism of renal origin: Secondary | ICD-10-CM | POA: Diagnosis not present

## 2022-03-03 DIAGNOSIS — D631 Anemia in chronic kidney disease: Secondary | ICD-10-CM | POA: Diagnosis not present

## 2022-03-03 DIAGNOSIS — N186 End stage renal disease: Secondary | ICD-10-CM | POA: Diagnosis not present

## 2022-03-04 DIAGNOSIS — D631 Anemia in chronic kidney disease: Secondary | ICD-10-CM | POA: Diagnosis not present

## 2022-03-04 DIAGNOSIS — Z23 Encounter for immunization: Secondary | ICD-10-CM | POA: Diagnosis not present

## 2022-03-04 DIAGNOSIS — N186 End stage renal disease: Secondary | ICD-10-CM | POA: Diagnosis not present

## 2022-03-04 DIAGNOSIS — N2581 Secondary hyperparathyroidism of renal origin: Secondary | ICD-10-CM | POA: Diagnosis not present

## 2022-03-05 DIAGNOSIS — D631 Anemia in chronic kidney disease: Secondary | ICD-10-CM | POA: Diagnosis not present

## 2022-03-05 DIAGNOSIS — N186 End stage renal disease: Secondary | ICD-10-CM | POA: Diagnosis not present

## 2022-03-05 DIAGNOSIS — Z23 Encounter for immunization: Secondary | ICD-10-CM | POA: Diagnosis not present

## 2022-03-05 DIAGNOSIS — N2581 Secondary hyperparathyroidism of renal origin: Secondary | ICD-10-CM | POA: Diagnosis not present

## 2022-03-06 DIAGNOSIS — N186 End stage renal disease: Secondary | ICD-10-CM | POA: Diagnosis not present

## 2022-03-06 DIAGNOSIS — N2581 Secondary hyperparathyroidism of renal origin: Secondary | ICD-10-CM | POA: Diagnosis not present

## 2022-03-06 DIAGNOSIS — D631 Anemia in chronic kidney disease: Secondary | ICD-10-CM | POA: Diagnosis not present

## 2022-03-06 DIAGNOSIS — Z23 Encounter for immunization: Secondary | ICD-10-CM | POA: Diagnosis not present

## 2022-03-07 DIAGNOSIS — N186 End stage renal disease: Secondary | ICD-10-CM | POA: Diagnosis not present

## 2022-03-07 DIAGNOSIS — D631 Anemia in chronic kidney disease: Secondary | ICD-10-CM | POA: Diagnosis not present

## 2022-03-07 DIAGNOSIS — Z23 Encounter for immunization: Secondary | ICD-10-CM | POA: Diagnosis not present

## 2022-03-07 DIAGNOSIS — N2581 Secondary hyperparathyroidism of renal origin: Secondary | ICD-10-CM | POA: Diagnosis not present

## 2022-03-08 ENCOUNTER — Telehealth: Payer: Self-pay | Admitting: *Deleted

## 2022-03-08 DIAGNOSIS — Z23 Encounter for immunization: Secondary | ICD-10-CM | POA: Diagnosis not present

## 2022-03-08 DIAGNOSIS — N2581 Secondary hyperparathyroidism of renal origin: Secondary | ICD-10-CM | POA: Diagnosis not present

## 2022-03-08 DIAGNOSIS — D631 Anemia in chronic kidney disease: Secondary | ICD-10-CM | POA: Diagnosis not present

## 2022-03-08 DIAGNOSIS — N186 End stage renal disease: Secondary | ICD-10-CM | POA: Diagnosis not present

## 2022-03-08 NOTE — Telephone Encounter (Signed)
Rec'd form for more  POS hours. Completed and place in MD purple folder for signature.Marland KitchenJohny Valencia

## 2022-03-09 DIAGNOSIS — D631 Anemia in chronic kidney disease: Secondary | ICD-10-CM | POA: Diagnosis not present

## 2022-03-09 DIAGNOSIS — N186 End stage renal disease: Secondary | ICD-10-CM | POA: Diagnosis not present

## 2022-03-09 DIAGNOSIS — N2581 Secondary hyperparathyroidism of renal origin: Secondary | ICD-10-CM | POA: Diagnosis not present

## 2022-03-09 DIAGNOSIS — Z23 Encounter for immunization: Secondary | ICD-10-CM | POA: Diagnosis not present

## 2022-03-09 NOTE — Telephone Encounter (Signed)
Called Mary Valencia no answer LMOM forms was faxed back to [facility] hands @ [phone].Marland KitchenJohny Valencia

## 2022-03-09 NOTE — Telephone Encounter (Signed)
Okay.  Thanks.

## 2022-03-10 DIAGNOSIS — Z23 Encounter for immunization: Secondary | ICD-10-CM | POA: Diagnosis not present

## 2022-03-10 DIAGNOSIS — N2581 Secondary hyperparathyroidism of renal origin: Secondary | ICD-10-CM | POA: Diagnosis not present

## 2022-03-10 DIAGNOSIS — D631 Anemia in chronic kidney disease: Secondary | ICD-10-CM | POA: Diagnosis not present

## 2022-03-10 DIAGNOSIS — N186 End stage renal disease: Secondary | ICD-10-CM | POA: Diagnosis not present

## 2022-03-11 DIAGNOSIS — D631 Anemia in chronic kidney disease: Secondary | ICD-10-CM | POA: Diagnosis not present

## 2022-03-11 DIAGNOSIS — N186 End stage renal disease: Secondary | ICD-10-CM | POA: Diagnosis not present

## 2022-03-11 DIAGNOSIS — Z23 Encounter for immunization: Secondary | ICD-10-CM | POA: Diagnosis not present

## 2022-03-11 DIAGNOSIS — N2581 Secondary hyperparathyroidism of renal origin: Secondary | ICD-10-CM | POA: Diagnosis not present

## 2022-03-12 DIAGNOSIS — D631 Anemia in chronic kidney disease: Secondary | ICD-10-CM | POA: Diagnosis not present

## 2022-03-12 DIAGNOSIS — N186 End stage renal disease: Secondary | ICD-10-CM | POA: Diagnosis not present

## 2022-03-12 DIAGNOSIS — N2581 Secondary hyperparathyroidism of renal origin: Secondary | ICD-10-CM | POA: Diagnosis not present

## 2022-03-12 DIAGNOSIS — Z23 Encounter for immunization: Secondary | ICD-10-CM | POA: Diagnosis not present

## 2022-03-13 DIAGNOSIS — Z23 Encounter for immunization: Secondary | ICD-10-CM | POA: Diagnosis not present

## 2022-03-13 DIAGNOSIS — D631 Anemia in chronic kidney disease: Secondary | ICD-10-CM | POA: Diagnosis not present

## 2022-03-13 DIAGNOSIS — N186 End stage renal disease: Secondary | ICD-10-CM | POA: Diagnosis not present

## 2022-03-13 DIAGNOSIS — N2581 Secondary hyperparathyroidism of renal origin: Secondary | ICD-10-CM | POA: Diagnosis not present

## 2022-03-14 DIAGNOSIS — D631 Anemia in chronic kidney disease: Secondary | ICD-10-CM | POA: Diagnosis not present

## 2022-03-14 DIAGNOSIS — N2581 Secondary hyperparathyroidism of renal origin: Secondary | ICD-10-CM | POA: Diagnosis not present

## 2022-03-14 DIAGNOSIS — N186 End stage renal disease: Secondary | ICD-10-CM | POA: Diagnosis not present

## 2022-03-14 DIAGNOSIS — Z23 Encounter for immunization: Secondary | ICD-10-CM | POA: Diagnosis not present

## 2022-03-15 DIAGNOSIS — N186 End stage renal disease: Secondary | ICD-10-CM | POA: Diagnosis not present

## 2022-03-15 DIAGNOSIS — Z23 Encounter for immunization: Secondary | ICD-10-CM | POA: Diagnosis not present

## 2022-03-15 DIAGNOSIS — D631 Anemia in chronic kidney disease: Secondary | ICD-10-CM | POA: Diagnosis not present

## 2022-03-15 DIAGNOSIS — N2581 Secondary hyperparathyroidism of renal origin: Secondary | ICD-10-CM | POA: Diagnosis not present

## 2022-03-16 DIAGNOSIS — N186 End stage renal disease: Secondary | ICD-10-CM | POA: Diagnosis not present

## 2022-03-16 DIAGNOSIS — Z23 Encounter for immunization: Secondary | ICD-10-CM | POA: Diagnosis not present

## 2022-03-16 DIAGNOSIS — D631 Anemia in chronic kidney disease: Secondary | ICD-10-CM | POA: Diagnosis not present

## 2022-03-16 DIAGNOSIS — N2581 Secondary hyperparathyroidism of renal origin: Secondary | ICD-10-CM | POA: Diagnosis not present

## 2022-03-17 DIAGNOSIS — N186 End stage renal disease: Secondary | ICD-10-CM | POA: Diagnosis not present

## 2022-03-17 DIAGNOSIS — D631 Anemia in chronic kidney disease: Secondary | ICD-10-CM | POA: Diagnosis not present

## 2022-03-17 DIAGNOSIS — N2581 Secondary hyperparathyroidism of renal origin: Secondary | ICD-10-CM | POA: Diagnosis not present

## 2022-03-17 DIAGNOSIS — Z23 Encounter for immunization: Secondary | ICD-10-CM | POA: Diagnosis not present

## 2022-03-18 DIAGNOSIS — N186 End stage renal disease: Secondary | ICD-10-CM | POA: Diagnosis not present

## 2022-03-18 DIAGNOSIS — Z23 Encounter for immunization: Secondary | ICD-10-CM | POA: Diagnosis not present

## 2022-03-18 DIAGNOSIS — D631 Anemia in chronic kidney disease: Secondary | ICD-10-CM | POA: Diagnosis not present

## 2022-03-18 DIAGNOSIS — N2581 Secondary hyperparathyroidism of renal origin: Secondary | ICD-10-CM | POA: Diagnosis not present

## 2022-03-19 DIAGNOSIS — N2581 Secondary hyperparathyroidism of renal origin: Secondary | ICD-10-CM | POA: Diagnosis not present

## 2022-03-19 DIAGNOSIS — Z23 Encounter for immunization: Secondary | ICD-10-CM | POA: Diagnosis not present

## 2022-03-19 DIAGNOSIS — D631 Anemia in chronic kidney disease: Secondary | ICD-10-CM | POA: Diagnosis not present

## 2022-03-19 DIAGNOSIS — N186 End stage renal disease: Secondary | ICD-10-CM | POA: Diagnosis not present

## 2022-03-20 DIAGNOSIS — E1122 Type 2 diabetes mellitus with diabetic chronic kidney disease: Secondary | ICD-10-CM | POA: Diagnosis not present

## 2022-03-20 DIAGNOSIS — L600 Ingrowing nail: Secondary | ICD-10-CM | POA: Diagnosis not present

## 2022-03-20 DIAGNOSIS — R0989 Other specified symptoms and signs involving the circulatory and respiratory systems: Secondary | ICD-10-CM | POA: Insufficient documentation

## 2022-03-20 DIAGNOSIS — N2581 Secondary hyperparathyroidism of renal origin: Secondary | ICD-10-CM | POA: Diagnosis not present

## 2022-03-20 DIAGNOSIS — Z992 Dependence on renal dialysis: Secondary | ICD-10-CM | POA: Diagnosis not present

## 2022-03-20 DIAGNOSIS — Z23 Encounter for immunization: Secondary | ICD-10-CM | POA: Diagnosis not present

## 2022-03-20 DIAGNOSIS — D631 Anemia in chronic kidney disease: Secondary | ICD-10-CM | POA: Diagnosis not present

## 2022-03-20 DIAGNOSIS — N186 End stage renal disease: Secondary | ICD-10-CM | POA: Diagnosis not present

## 2022-03-20 DIAGNOSIS — Z794 Long term (current) use of insulin: Secondary | ICD-10-CM | POA: Diagnosis not present

## 2022-03-21 DIAGNOSIS — D631 Anemia in chronic kidney disease: Secondary | ICD-10-CM | POA: Diagnosis not present

## 2022-03-21 DIAGNOSIS — N2581 Secondary hyperparathyroidism of renal origin: Secondary | ICD-10-CM | POA: Diagnosis not present

## 2022-03-21 DIAGNOSIS — Z23 Encounter for immunization: Secondary | ICD-10-CM | POA: Diagnosis not present

## 2022-03-21 DIAGNOSIS — N186 End stage renal disease: Secondary | ICD-10-CM | POA: Diagnosis not present

## 2022-03-22 DIAGNOSIS — D631 Anemia in chronic kidney disease: Secondary | ICD-10-CM | POA: Diagnosis not present

## 2022-03-22 DIAGNOSIS — Z23 Encounter for immunization: Secondary | ICD-10-CM | POA: Diagnosis not present

## 2022-03-22 DIAGNOSIS — N2581 Secondary hyperparathyroidism of renal origin: Secondary | ICD-10-CM | POA: Diagnosis not present

## 2022-03-22 DIAGNOSIS — N186 End stage renal disease: Secondary | ICD-10-CM | POA: Diagnosis not present

## 2022-03-23 DIAGNOSIS — N186 End stage renal disease: Secondary | ICD-10-CM | POA: Diagnosis not present

## 2022-03-23 DIAGNOSIS — Z23 Encounter for immunization: Secondary | ICD-10-CM | POA: Diagnosis not present

## 2022-03-23 DIAGNOSIS — D631 Anemia in chronic kidney disease: Secondary | ICD-10-CM | POA: Diagnosis not present

## 2022-03-23 DIAGNOSIS — N2581 Secondary hyperparathyroidism of renal origin: Secondary | ICD-10-CM | POA: Diagnosis not present

## 2022-03-24 DIAGNOSIS — N2581 Secondary hyperparathyroidism of renal origin: Secondary | ICD-10-CM | POA: Diagnosis not present

## 2022-03-24 DIAGNOSIS — N186 End stage renal disease: Secondary | ICD-10-CM | POA: Diagnosis not present

## 2022-03-24 DIAGNOSIS — D631 Anemia in chronic kidney disease: Secondary | ICD-10-CM | POA: Diagnosis not present

## 2022-03-24 DIAGNOSIS — Z23 Encounter for immunization: Secondary | ICD-10-CM | POA: Diagnosis not present

## 2022-03-25 DIAGNOSIS — D631 Anemia in chronic kidney disease: Secondary | ICD-10-CM | POA: Diagnosis not present

## 2022-03-25 DIAGNOSIS — N186 End stage renal disease: Secondary | ICD-10-CM | POA: Diagnosis not present

## 2022-03-25 DIAGNOSIS — Z23 Encounter for immunization: Secondary | ICD-10-CM | POA: Diagnosis not present

## 2022-03-25 DIAGNOSIS — N2581 Secondary hyperparathyroidism of renal origin: Secondary | ICD-10-CM | POA: Diagnosis not present

## 2022-03-25 DIAGNOSIS — Z992 Dependence on renal dialysis: Secondary | ICD-10-CM | POA: Diagnosis not present

## 2022-03-26 DIAGNOSIS — N2581 Secondary hyperparathyroidism of renal origin: Secondary | ICD-10-CM | POA: Diagnosis not present

## 2022-03-26 DIAGNOSIS — D631 Anemia in chronic kidney disease: Secondary | ICD-10-CM | POA: Diagnosis not present

## 2022-03-26 DIAGNOSIS — N186 End stage renal disease: Secondary | ICD-10-CM | POA: Diagnosis not present

## 2022-03-27 DIAGNOSIS — D631 Anemia in chronic kidney disease: Secondary | ICD-10-CM | POA: Diagnosis not present

## 2022-03-27 DIAGNOSIS — N2581 Secondary hyperparathyroidism of renal origin: Secondary | ICD-10-CM | POA: Diagnosis not present

## 2022-03-27 DIAGNOSIS — N186 End stage renal disease: Secondary | ICD-10-CM | POA: Diagnosis not present

## 2022-03-28 DIAGNOSIS — N2581 Secondary hyperparathyroidism of renal origin: Secondary | ICD-10-CM | POA: Diagnosis not present

## 2022-03-28 DIAGNOSIS — D631 Anemia in chronic kidney disease: Secondary | ICD-10-CM | POA: Diagnosis not present

## 2022-03-28 DIAGNOSIS — N186 End stage renal disease: Secondary | ICD-10-CM | POA: Diagnosis not present

## 2022-03-29 DIAGNOSIS — N2581 Secondary hyperparathyroidism of renal origin: Secondary | ICD-10-CM | POA: Diagnosis not present

## 2022-03-29 DIAGNOSIS — Z4889 Encounter for other specified surgical aftercare: Secondary | ICD-10-CM | POA: Insufficient documentation

## 2022-03-29 DIAGNOSIS — D631 Anemia in chronic kidney disease: Secondary | ICD-10-CM | POA: Diagnosis not present

## 2022-03-29 DIAGNOSIS — N186 End stage renal disease: Secondary | ICD-10-CM | POA: Diagnosis not present

## 2022-03-30 DIAGNOSIS — E119 Type 2 diabetes mellitus without complications: Secondary | ICD-10-CM | POA: Diagnosis not present

## 2022-03-30 DIAGNOSIS — N2581 Secondary hyperparathyroidism of renal origin: Secondary | ICD-10-CM | POA: Diagnosis not present

## 2022-03-30 DIAGNOSIS — N186 End stage renal disease: Secondary | ICD-10-CM | POA: Diagnosis not present

## 2022-03-30 DIAGNOSIS — D631 Anemia in chronic kidney disease: Secondary | ICD-10-CM | POA: Diagnosis not present

## 2022-03-31 DIAGNOSIS — D631 Anemia in chronic kidney disease: Secondary | ICD-10-CM | POA: Diagnosis not present

## 2022-03-31 DIAGNOSIS — N186 End stage renal disease: Secondary | ICD-10-CM | POA: Diagnosis not present

## 2022-03-31 DIAGNOSIS — N2581 Secondary hyperparathyroidism of renal origin: Secondary | ICD-10-CM | POA: Diagnosis not present

## 2022-04-01 DIAGNOSIS — D631 Anemia in chronic kidney disease: Secondary | ICD-10-CM | POA: Diagnosis not present

## 2022-04-01 DIAGNOSIS — N2581 Secondary hyperparathyroidism of renal origin: Secondary | ICD-10-CM | POA: Diagnosis not present

## 2022-04-01 DIAGNOSIS — N186 End stage renal disease: Secondary | ICD-10-CM | POA: Diagnosis not present

## 2022-04-02 DIAGNOSIS — D631 Anemia in chronic kidney disease: Secondary | ICD-10-CM | POA: Diagnosis not present

## 2022-04-02 DIAGNOSIS — N2581 Secondary hyperparathyroidism of renal origin: Secondary | ICD-10-CM | POA: Diagnosis not present

## 2022-04-02 DIAGNOSIS — N186 End stage renal disease: Secondary | ICD-10-CM | POA: Diagnosis not present

## 2022-04-03 DIAGNOSIS — N186 End stage renal disease: Secondary | ICD-10-CM | POA: Diagnosis not present

## 2022-04-03 DIAGNOSIS — N2581 Secondary hyperparathyroidism of renal origin: Secondary | ICD-10-CM | POA: Diagnosis not present

## 2022-04-03 DIAGNOSIS — D631 Anemia in chronic kidney disease: Secondary | ICD-10-CM | POA: Diagnosis not present

## 2022-04-04 DIAGNOSIS — N2581 Secondary hyperparathyroidism of renal origin: Secondary | ICD-10-CM | POA: Diagnosis not present

## 2022-04-04 DIAGNOSIS — N186 End stage renal disease: Secondary | ICD-10-CM | POA: Diagnosis not present

## 2022-04-04 DIAGNOSIS — D631 Anemia in chronic kidney disease: Secondary | ICD-10-CM | POA: Diagnosis not present

## 2022-04-05 DIAGNOSIS — D631 Anemia in chronic kidney disease: Secondary | ICD-10-CM | POA: Diagnosis not present

## 2022-04-05 DIAGNOSIS — N186 End stage renal disease: Secondary | ICD-10-CM | POA: Diagnosis not present

## 2022-04-05 DIAGNOSIS — N2581 Secondary hyperparathyroidism of renal origin: Secondary | ICD-10-CM | POA: Diagnosis not present

## 2022-04-06 DIAGNOSIS — N186 End stage renal disease: Secondary | ICD-10-CM | POA: Diagnosis not present

## 2022-04-06 DIAGNOSIS — D631 Anemia in chronic kidney disease: Secondary | ICD-10-CM | POA: Diagnosis not present

## 2022-04-06 DIAGNOSIS — N2581 Secondary hyperparathyroidism of renal origin: Secondary | ICD-10-CM | POA: Diagnosis not present

## 2022-04-07 DIAGNOSIS — N2581 Secondary hyperparathyroidism of renal origin: Secondary | ICD-10-CM | POA: Diagnosis not present

## 2022-04-07 DIAGNOSIS — N186 End stage renal disease: Secondary | ICD-10-CM | POA: Diagnosis not present

## 2022-04-07 DIAGNOSIS — D631 Anemia in chronic kidney disease: Secondary | ICD-10-CM | POA: Diagnosis not present

## 2022-04-08 DIAGNOSIS — D631 Anemia in chronic kidney disease: Secondary | ICD-10-CM | POA: Diagnosis not present

## 2022-04-08 DIAGNOSIS — N2581 Secondary hyperparathyroidism of renal origin: Secondary | ICD-10-CM | POA: Diagnosis not present

## 2022-04-08 DIAGNOSIS — N186 End stage renal disease: Secondary | ICD-10-CM | POA: Diagnosis not present

## 2022-04-09 DIAGNOSIS — N2581 Secondary hyperparathyroidism of renal origin: Secondary | ICD-10-CM | POA: Diagnosis not present

## 2022-04-09 DIAGNOSIS — D631 Anemia in chronic kidney disease: Secondary | ICD-10-CM | POA: Diagnosis not present

## 2022-04-09 DIAGNOSIS — N186 End stage renal disease: Secondary | ICD-10-CM | POA: Diagnosis not present

## 2022-04-10 DIAGNOSIS — N2581 Secondary hyperparathyroidism of renal origin: Secondary | ICD-10-CM | POA: Diagnosis not present

## 2022-04-10 DIAGNOSIS — N186 End stage renal disease: Secondary | ICD-10-CM | POA: Diagnosis not present

## 2022-04-10 DIAGNOSIS — D631 Anemia in chronic kidney disease: Secondary | ICD-10-CM | POA: Diagnosis not present

## 2022-04-11 DIAGNOSIS — N2581 Secondary hyperparathyroidism of renal origin: Secondary | ICD-10-CM | POA: Diagnosis not present

## 2022-04-11 DIAGNOSIS — D631 Anemia in chronic kidney disease: Secondary | ICD-10-CM | POA: Diagnosis not present

## 2022-04-11 DIAGNOSIS — N186 End stage renal disease: Secondary | ICD-10-CM | POA: Diagnosis not present

## 2022-04-12 DIAGNOSIS — N2581 Secondary hyperparathyroidism of renal origin: Secondary | ICD-10-CM | POA: Diagnosis not present

## 2022-04-12 DIAGNOSIS — D631 Anemia in chronic kidney disease: Secondary | ICD-10-CM | POA: Diagnosis not present

## 2022-04-12 DIAGNOSIS — N186 End stage renal disease: Secondary | ICD-10-CM | POA: Diagnosis not present

## 2022-04-13 DIAGNOSIS — N2581 Secondary hyperparathyroidism of renal origin: Secondary | ICD-10-CM | POA: Diagnosis not present

## 2022-04-13 DIAGNOSIS — D631 Anemia in chronic kidney disease: Secondary | ICD-10-CM | POA: Diagnosis not present

## 2022-04-13 DIAGNOSIS — N186 End stage renal disease: Secondary | ICD-10-CM | POA: Diagnosis not present

## 2022-04-14 DIAGNOSIS — N2581 Secondary hyperparathyroidism of renal origin: Secondary | ICD-10-CM | POA: Diagnosis not present

## 2022-04-14 DIAGNOSIS — N186 End stage renal disease: Secondary | ICD-10-CM | POA: Diagnosis not present

## 2022-04-14 DIAGNOSIS — D631 Anemia in chronic kidney disease: Secondary | ICD-10-CM | POA: Diagnosis not present

## 2022-04-15 DIAGNOSIS — D631 Anemia in chronic kidney disease: Secondary | ICD-10-CM | POA: Diagnosis not present

## 2022-04-15 DIAGNOSIS — N2581 Secondary hyperparathyroidism of renal origin: Secondary | ICD-10-CM | POA: Diagnosis not present

## 2022-04-15 DIAGNOSIS — N186 End stage renal disease: Secondary | ICD-10-CM | POA: Diagnosis not present

## 2022-04-16 DIAGNOSIS — N186 End stage renal disease: Secondary | ICD-10-CM | POA: Diagnosis not present

## 2022-04-16 DIAGNOSIS — N2581 Secondary hyperparathyroidism of renal origin: Secondary | ICD-10-CM | POA: Diagnosis not present

## 2022-04-16 DIAGNOSIS — D631 Anemia in chronic kidney disease: Secondary | ICD-10-CM | POA: Diagnosis not present

## 2022-04-17 ENCOUNTER — Telehealth: Payer: Self-pay

## 2022-04-17 DIAGNOSIS — N2581 Secondary hyperparathyroidism of renal origin: Secondary | ICD-10-CM | POA: Diagnosis not present

## 2022-04-17 DIAGNOSIS — N186 End stage renal disease: Secondary | ICD-10-CM | POA: Diagnosis not present

## 2022-04-17 DIAGNOSIS — D631 Anemia in chronic kidney disease: Secondary | ICD-10-CM | POA: Diagnosis not present

## 2022-04-17 NOTE — Telephone Encounter (Signed)
Mary Valencia should probably check first with her dialysis doctor and ask for the advice.  We can schedule a virtual office visit with me  Thanks

## 2022-04-17 NOTE — Telephone Encounter (Signed)
Patient is calling in stating that she does dialysis at home, noticed a spike in blood sugar levels (ranging 200's) since they increased 5 rounds. Wants to know what to do, did advise it is hard for her to come in for an appointment.

## 2022-04-18 DIAGNOSIS — N186 End stage renal disease: Secondary | ICD-10-CM | POA: Diagnosis not present

## 2022-04-18 DIAGNOSIS — D631 Anemia in chronic kidney disease: Secondary | ICD-10-CM | POA: Diagnosis not present

## 2022-04-18 DIAGNOSIS — N2581 Secondary hyperparathyroidism of renal origin: Secondary | ICD-10-CM | POA: Diagnosis not present

## 2022-04-18 NOTE — Telephone Encounter (Signed)
Notified pt w/ MD response. Pt sates they told her to f/u w/ [facility]. Made virtual appt for tomorrow at 11:40.Marland KitchenJohny Chess

## 2022-04-19 ENCOUNTER — Telehealth (INDEPENDENT_AMBULATORY_CARE_PROVIDER_SITE_OTHER): Payer: Medicare Other | Admitting: Internal Medicine

## 2022-04-19 ENCOUNTER — Encounter: Payer: Self-pay | Admitting: Internal Medicine

## 2022-04-19 DIAGNOSIS — E118 Type 2 diabetes mellitus with unspecified complications: Secondary | ICD-10-CM

## 2022-04-19 DIAGNOSIS — N186 End stage renal disease: Secondary | ICD-10-CM | POA: Diagnosis not present

## 2022-04-19 DIAGNOSIS — Z992 Dependence on renal dialysis: Secondary | ICD-10-CM | POA: Diagnosis not present

## 2022-04-19 DIAGNOSIS — N2581 Secondary hyperparathyroidism of renal origin: Secondary | ICD-10-CM | POA: Diagnosis not present

## 2022-04-19 DIAGNOSIS — D631 Anemia in chronic kidney disease: Secondary | ICD-10-CM | POA: Diagnosis not present

## 2022-04-19 NOTE — Assessment & Plan Note (Signed)
Uncontrolled diabetes due to changes in her peritoneal dialysis regimen. Her blood sugars around 200-300 throughout the night and in the morning fasting.  She is using a libre 2 sensor to monitor her glucose.  She is using NPH insulin 8 units in the morning and 8 units at night.  We will increase his nighttime NPH to 10 units and then increase by 1-2 units every 1-2 days until we reach acceptable glucose levels.  She will monitor her sugars via libre 2 sensor. Mary Valencia can adjust her morning insulin later if needed She has a [facility] pen in case of hypoglycemia Follow-up with me in 2 weeks.

## 2022-04-19 NOTE — Progress Notes (Signed)
Virtual Visit via Video Note  I connected with [name] on 04/19/22 at 11:40 AM EDT by a video enabled telemedicine application and verified that I am speaking with the correct person using two identifiers.   I discussed the limitations of evaluation and management by telemedicine and the availability of in person appointments. The patient expressed understanding and agreed to proceed.  I was located at our [facility] office. The patient was at home. There was no one else present in the visit.  No chief complaint on file.    History of Present Illness:  Mary Valencia is getting peritoneal dialysis 5 cycles at night now.  Her blood sugars around 200-300 throughout the night and in the morning fasting.  She is using a libre 2 sensor to monitor her glucose.  She is using NPH insulin 8 units in the morning and 8 units at night.  She is wondering what to do.  ROS No significant hypoglycemic episodes.  Observations/Objective: The patient appears to be in no acute distress.  She appears tired  Assessment and Plan:  Problem List Items Addressed This Visit     Diabetes mellitus type 2 with complications ([place])    Uncontrolled diabetes due to changes in her peritoneal dialysis regimen. Her blood sugars around 200-300 throughout the night and in the morning fasting.  She is using a libre 2 sensor to monitor her glucose.  She is using NPH insulin 8 units in the morning and 8 units at night.  We will increase his nighttime NPH to 10 units and then increase by 1-2 units every 1-2 days until we reach acceptable glucose levels.  She will monitor her sugars via libre 2 sensor. Mary Valencia can adjust her morning insulin later if needed She has a [facility] pen in case of hypoglycemia Follow-up with me in 2 weeks.      ESRD (end stage renal disease) on dialysis ([place])    On peritoneal dialysis 5 cycles per night now        No orders of the defined types were placed in this encounter.    Follow Up  Instructions:    I discussed the assessment and treatment plan with the patient. The patient was provided an opportunity to ask questions and all were answered. The patient agreed with the plan and demonstrated an understanding of the instructions.   The patient was advised to call back or seek an in-person evaluation if the symptoms worsen or if the condition fails to improve as anticipated.  I provided face-to-face time during this encounter. We were at different locations.   Walker Kehr, MD

## 2022-04-19 NOTE — Assessment & Plan Note (Signed)
On peritoneal dialysis 5 cycles per night now

## 2022-04-20 DIAGNOSIS — D631 Anemia in chronic kidney disease: Secondary | ICD-10-CM | POA: Diagnosis not present

## 2022-04-20 DIAGNOSIS — N2581 Secondary hyperparathyroidism of renal origin: Secondary | ICD-10-CM | POA: Diagnosis not present

## 2022-04-20 DIAGNOSIS — N186 End stage renal disease: Secondary | ICD-10-CM | POA: Diagnosis not present

## 2022-04-21 DIAGNOSIS — N2581 Secondary hyperparathyroidism of renal origin: Secondary | ICD-10-CM | POA: Diagnosis not present

## 2022-04-21 DIAGNOSIS — N186 End stage renal disease: Secondary | ICD-10-CM | POA: Diagnosis not present

## 2022-04-21 DIAGNOSIS — D631 Anemia in chronic kidney disease: Secondary | ICD-10-CM | POA: Diagnosis not present

## 2022-04-22 DIAGNOSIS — N186 End stage renal disease: Secondary | ICD-10-CM | POA: Diagnosis not present

## 2022-04-22 DIAGNOSIS — N2581 Secondary hyperparathyroidism of renal origin: Secondary | ICD-10-CM | POA: Diagnosis not present

## 2022-04-22 DIAGNOSIS — D631 Anemia in chronic kidney disease: Secondary | ICD-10-CM | POA: Diagnosis not present

## 2022-04-23 DIAGNOSIS — D631 Anemia in chronic kidney disease: Secondary | ICD-10-CM | POA: Diagnosis not present

## 2022-04-23 DIAGNOSIS — N2581 Secondary hyperparathyroidism of renal origin: Secondary | ICD-10-CM | POA: Diagnosis not present

## 2022-04-23 DIAGNOSIS — N186 End stage renal disease: Secondary | ICD-10-CM | POA: Diagnosis not present

## 2022-04-24 DIAGNOSIS — N186 End stage renal disease: Secondary | ICD-10-CM | POA: Diagnosis not present

## 2022-04-24 DIAGNOSIS — D631 Anemia in chronic kidney disease: Secondary | ICD-10-CM | POA: Diagnosis not present

## 2022-04-24 DIAGNOSIS — N2581 Secondary hyperparathyroidism of renal origin: Secondary | ICD-10-CM | POA: Diagnosis not present

## 2022-04-25 DIAGNOSIS — N2581 Secondary hyperparathyroidism of renal origin: Secondary | ICD-10-CM | POA: Diagnosis not present

## 2022-04-25 DIAGNOSIS — Z992 Dependence on renal dialysis: Secondary | ICD-10-CM | POA: Diagnosis not present

## 2022-04-25 DIAGNOSIS — D631 Anemia in chronic kidney disease: Secondary | ICD-10-CM | POA: Diagnosis not present

## 2022-04-25 DIAGNOSIS — N186 End stage renal disease: Secondary | ICD-10-CM | POA: Diagnosis not present

## 2022-04-26 DIAGNOSIS — N2581 Secondary hyperparathyroidism of renal origin: Secondary | ICD-10-CM | POA: Diagnosis not present

## 2022-04-26 DIAGNOSIS — N186 End stage renal disease: Secondary | ICD-10-CM | POA: Diagnosis not present

## 2022-04-26 DIAGNOSIS — D631 Anemia in chronic kidney disease: Secondary | ICD-10-CM | POA: Diagnosis not present

## 2022-04-27 DIAGNOSIS — D631 Anemia in chronic kidney disease: Secondary | ICD-10-CM | POA: Diagnosis not present

## 2022-04-27 DIAGNOSIS — N2581 Secondary hyperparathyroidism of renal origin: Secondary | ICD-10-CM | POA: Diagnosis not present

## 2022-04-27 DIAGNOSIS — N186 End stage renal disease: Secondary | ICD-10-CM | POA: Diagnosis not present

## 2022-04-28 DIAGNOSIS — N2581 Secondary hyperparathyroidism of renal origin: Secondary | ICD-10-CM | POA: Diagnosis not present

## 2022-04-28 DIAGNOSIS — N186 End stage renal disease: Secondary | ICD-10-CM | POA: Diagnosis not present

## 2022-04-28 DIAGNOSIS — D631 Anemia in chronic kidney disease: Secondary | ICD-10-CM | POA: Diagnosis not present

## 2022-04-29 DIAGNOSIS — D631 Anemia in chronic kidney disease: Secondary | ICD-10-CM | POA: Diagnosis not present

## 2022-04-29 DIAGNOSIS — N186 End stage renal disease: Secondary | ICD-10-CM | POA: Diagnosis not present

## 2022-04-29 DIAGNOSIS — N2581 Secondary hyperparathyroidism of renal origin: Secondary | ICD-10-CM | POA: Diagnosis not present

## 2022-04-30 DIAGNOSIS — N186 End stage renal disease: Secondary | ICD-10-CM | POA: Diagnosis not present

## 2022-04-30 DIAGNOSIS — N2581 Secondary hyperparathyroidism of renal origin: Secondary | ICD-10-CM | POA: Diagnosis not present

## 2022-04-30 DIAGNOSIS — D631 Anemia in chronic kidney disease: Secondary | ICD-10-CM | POA: Diagnosis not present

## 2022-05-01 DIAGNOSIS — N2581 Secondary hyperparathyroidism of renal origin: Secondary | ICD-10-CM | POA: Diagnosis not present

## 2022-05-01 DIAGNOSIS — D631 Anemia in chronic kidney disease: Secondary | ICD-10-CM | POA: Diagnosis not present

## 2022-05-01 DIAGNOSIS — N186 End stage renal disease: Secondary | ICD-10-CM | POA: Diagnosis not present

## 2022-05-02 DIAGNOSIS — N186 End stage renal disease: Secondary | ICD-10-CM | POA: Diagnosis not present

## 2022-05-02 DIAGNOSIS — E119 Type 2 diabetes mellitus without complications: Secondary | ICD-10-CM | POA: Diagnosis not present

## 2022-05-02 DIAGNOSIS — D631 Anemia in chronic kidney disease: Secondary | ICD-10-CM | POA: Diagnosis not present

## 2022-05-02 DIAGNOSIS — N2581 Secondary hyperparathyroidism of renal origin: Secondary | ICD-10-CM | POA: Diagnosis not present

## 2022-05-03 DIAGNOSIS — N186 End stage renal disease: Secondary | ICD-10-CM | POA: Diagnosis not present

## 2022-05-03 DIAGNOSIS — N2581 Secondary hyperparathyroidism of renal origin: Secondary | ICD-10-CM | POA: Diagnosis not present

## 2022-05-03 DIAGNOSIS — D631 Anemia in chronic kidney disease: Secondary | ICD-10-CM | POA: Diagnosis not present

## 2022-05-04 DIAGNOSIS — D631 Anemia in chronic kidney disease: Secondary | ICD-10-CM | POA: Diagnosis not present

## 2022-05-04 DIAGNOSIS — N2581 Secondary hyperparathyroidism of renal origin: Secondary | ICD-10-CM | POA: Diagnosis not present

## 2022-05-04 DIAGNOSIS — N186 End stage renal disease: Secondary | ICD-10-CM | POA: Diagnosis not present

## 2022-05-05 DIAGNOSIS — N186 End stage renal disease: Secondary | ICD-10-CM | POA: Diagnosis not present

## 2022-05-05 DIAGNOSIS — N2581 Secondary hyperparathyroidism of renal origin: Secondary | ICD-10-CM | POA: Diagnosis not present

## 2022-05-05 DIAGNOSIS — D631 Anemia in chronic kidney disease: Secondary | ICD-10-CM | POA: Diagnosis not present

## 2022-05-06 DIAGNOSIS — N186 End stage renal disease: Secondary | ICD-10-CM | POA: Diagnosis not present

## 2022-05-06 DIAGNOSIS — D631 Anemia in chronic kidney disease: Secondary | ICD-10-CM | POA: Diagnosis not present

## 2022-05-06 DIAGNOSIS — N2581 Secondary hyperparathyroidism of renal origin: Secondary | ICD-10-CM | POA: Diagnosis not present

## 2022-05-07 DIAGNOSIS — N2581 Secondary hyperparathyroidism of renal origin: Secondary | ICD-10-CM | POA: Diagnosis not present

## 2022-05-07 DIAGNOSIS — N186 End stage renal disease: Secondary | ICD-10-CM | POA: Diagnosis not present

## 2022-05-07 DIAGNOSIS — D631 Anemia in chronic kidney disease: Secondary | ICD-10-CM | POA: Diagnosis not present

## 2022-05-08 DIAGNOSIS — N2581 Secondary hyperparathyroidism of renal origin: Secondary | ICD-10-CM | POA: Diagnosis not present

## 2022-05-08 DIAGNOSIS — N186 End stage renal disease: Secondary | ICD-10-CM | POA: Diagnosis not present

## 2022-05-08 DIAGNOSIS — D631 Anemia in chronic kidney disease: Secondary | ICD-10-CM | POA: Diagnosis not present

## 2022-05-09 DIAGNOSIS — N186 End stage renal disease: Secondary | ICD-10-CM | POA: Diagnosis not present

## 2022-05-09 DIAGNOSIS — D631 Anemia in chronic kidney disease: Secondary | ICD-10-CM | POA: Diagnosis not present

## 2022-05-09 DIAGNOSIS — N2581 Secondary hyperparathyroidism of renal origin: Secondary | ICD-10-CM | POA: Diagnosis not present

## 2022-05-10 DIAGNOSIS — D631 Anemia in chronic kidney disease: Secondary | ICD-10-CM | POA: Diagnosis not present

## 2022-05-10 DIAGNOSIS — N186 End stage renal disease: Secondary | ICD-10-CM | POA: Diagnosis not present

## 2022-05-10 DIAGNOSIS — N2581 Secondary hyperparathyroidism of renal origin: Secondary | ICD-10-CM | POA: Diagnosis not present

## 2022-05-11 DIAGNOSIS — D631 Anemia in chronic kidney disease: Secondary | ICD-10-CM | POA: Diagnosis not present

## 2022-05-11 DIAGNOSIS — N186 End stage renal disease: Secondary | ICD-10-CM | POA: Diagnosis not present

## 2022-05-11 DIAGNOSIS — N2581 Secondary hyperparathyroidism of renal origin: Secondary | ICD-10-CM | POA: Diagnosis not present

## 2022-05-12 DIAGNOSIS — N186 End stage renal disease: Secondary | ICD-10-CM | POA: Diagnosis not present

## 2022-05-12 DIAGNOSIS — N2581 Secondary hyperparathyroidism of renal origin: Secondary | ICD-10-CM | POA: Diagnosis not present

## 2022-05-12 DIAGNOSIS — D631 Anemia in chronic kidney disease: Secondary | ICD-10-CM | POA: Diagnosis not present

## 2022-05-13 DIAGNOSIS — D631 Anemia in chronic kidney disease: Secondary | ICD-10-CM | POA: Diagnosis not present

## 2022-05-13 DIAGNOSIS — N186 End stage renal disease: Secondary | ICD-10-CM | POA: Diagnosis not present

## 2022-05-13 DIAGNOSIS — N2581 Secondary hyperparathyroidism of renal origin: Secondary | ICD-10-CM | POA: Diagnosis not present

## 2022-05-14 DIAGNOSIS — N2581 Secondary hyperparathyroidism of renal origin: Secondary | ICD-10-CM | POA: Diagnosis not present

## 2022-05-14 DIAGNOSIS — D631 Anemia in chronic kidney disease: Secondary | ICD-10-CM | POA: Diagnosis not present

## 2022-05-14 DIAGNOSIS — N186 End stage renal disease: Secondary | ICD-10-CM | POA: Diagnosis not present

## 2022-05-15 DIAGNOSIS — N186 End stage renal disease: Secondary | ICD-10-CM | POA: Diagnosis not present

## 2022-05-15 DIAGNOSIS — N2581 Secondary hyperparathyroidism of renal origin: Secondary | ICD-10-CM | POA: Diagnosis not present

## 2022-05-15 DIAGNOSIS — D631 Anemia in chronic kidney disease: Secondary | ICD-10-CM | POA: Diagnosis not present

## 2022-05-16 DIAGNOSIS — D631 Anemia in chronic kidney disease: Secondary | ICD-10-CM | POA: Diagnosis not present

## 2022-05-16 DIAGNOSIS — N2581 Secondary hyperparathyroidism of renal origin: Secondary | ICD-10-CM | POA: Diagnosis not present

## 2022-05-16 DIAGNOSIS — N186 End stage renal disease: Secondary | ICD-10-CM | POA: Diagnosis not present

## 2022-05-17 DIAGNOSIS — N2581 Secondary hyperparathyroidism of renal origin: Secondary | ICD-10-CM | POA: Diagnosis not present

## 2022-05-17 DIAGNOSIS — N186 End stage renal disease: Secondary | ICD-10-CM | POA: Diagnosis not present

## 2022-05-17 DIAGNOSIS — D631 Anemia in chronic kidney disease: Secondary | ICD-10-CM | POA: Diagnosis not present

## 2022-05-18 DIAGNOSIS — N2581 Secondary hyperparathyroidism of renal origin: Secondary | ICD-10-CM | POA: Diagnosis not present

## 2022-05-18 DIAGNOSIS — D631 Anemia in chronic kidney disease: Secondary | ICD-10-CM | POA: Diagnosis not present

## 2022-05-18 DIAGNOSIS — N186 End stage renal disease: Secondary | ICD-10-CM | POA: Diagnosis not present

## 2022-05-19 DIAGNOSIS — E119 Type 2 diabetes mellitus without complications: Secondary | ICD-10-CM | POA: Diagnosis not present

## 2022-05-19 DIAGNOSIS — N186 End stage renal disease: Secondary | ICD-10-CM | POA: Diagnosis not present

## 2022-05-19 DIAGNOSIS — D631 Anemia in chronic kidney disease: Secondary | ICD-10-CM | POA: Diagnosis not present

## 2022-05-19 DIAGNOSIS — N2581 Secondary hyperparathyroidism of renal origin: Secondary | ICD-10-CM | POA: Diagnosis not present

## 2022-05-19 DIAGNOSIS — Z794 Long term (current) use of insulin: Secondary | ICD-10-CM | POA: Diagnosis not present

## 2022-05-20 DIAGNOSIS — D631 Anemia in chronic kidney disease: Secondary | ICD-10-CM | POA: Diagnosis not present

## 2022-05-20 DIAGNOSIS — N2581 Secondary hyperparathyroidism of renal origin: Secondary | ICD-10-CM | POA: Diagnosis not present

## 2022-05-20 DIAGNOSIS — N186 End stage renal disease: Secondary | ICD-10-CM | POA: Diagnosis not present

## 2022-05-21 DIAGNOSIS — N186 End stage renal disease: Secondary | ICD-10-CM | POA: Diagnosis not present

## 2022-05-21 DIAGNOSIS — N2581 Secondary hyperparathyroidism of renal origin: Secondary | ICD-10-CM | POA: Diagnosis not present

## 2022-05-21 DIAGNOSIS — D631 Anemia in chronic kidney disease: Secondary | ICD-10-CM | POA: Diagnosis not present

## 2022-05-22 DIAGNOSIS — D631 Anemia in chronic kidney disease: Secondary | ICD-10-CM | POA: Diagnosis not present

## 2022-05-22 DIAGNOSIS — N2581 Secondary hyperparathyroidism of renal origin: Secondary | ICD-10-CM | POA: Diagnosis not present

## 2022-05-22 DIAGNOSIS — N186 End stage renal disease: Secondary | ICD-10-CM | POA: Diagnosis not present

## 2022-05-23 DIAGNOSIS — N2581 Secondary hyperparathyroidism of renal origin: Secondary | ICD-10-CM | POA: Diagnosis not present

## 2022-05-23 DIAGNOSIS — D631 Anemia in chronic kidney disease: Secondary | ICD-10-CM | POA: Diagnosis not present

## 2022-05-23 DIAGNOSIS — N186 End stage renal disease: Secondary | ICD-10-CM | POA: Diagnosis not present

## 2022-05-24 DIAGNOSIS — N186 End stage renal disease: Secondary | ICD-10-CM | POA: Diagnosis not present

## 2022-05-24 DIAGNOSIS — D631 Anemia in chronic kidney disease: Secondary | ICD-10-CM | POA: Diagnosis not present

## 2022-05-24 DIAGNOSIS — N2581 Secondary hyperparathyroidism of renal origin: Secondary | ICD-10-CM | POA: Diagnosis not present

## 2022-05-25 DIAGNOSIS — N2581 Secondary hyperparathyroidism of renal origin: Secondary | ICD-10-CM | POA: Diagnosis not present

## 2022-05-25 DIAGNOSIS — N186 End stage renal disease: Secondary | ICD-10-CM | POA: Diagnosis not present

## 2022-05-25 DIAGNOSIS — Z992 Dependence on renal dialysis: Secondary | ICD-10-CM | POA: Diagnosis not present

## 2022-05-25 DIAGNOSIS — D631 Anemia in chronic kidney disease: Secondary | ICD-10-CM | POA: Diagnosis not present

## 2022-05-26 DIAGNOSIS — N186 End stage renal disease: Secondary | ICD-10-CM | POA: Diagnosis not present

## 2022-05-26 DIAGNOSIS — D631 Anemia in chronic kidney disease: Secondary | ICD-10-CM | POA: Diagnosis not present

## 2022-05-26 DIAGNOSIS — N2581 Secondary hyperparathyroidism of renal origin: Secondary | ICD-10-CM | POA: Diagnosis not present

## 2022-05-27 DIAGNOSIS — N2581 Secondary hyperparathyroidism of renal origin: Secondary | ICD-10-CM | POA: Diagnosis not present

## 2022-05-27 DIAGNOSIS — D631 Anemia in chronic kidney disease: Secondary | ICD-10-CM | POA: Diagnosis not present

## 2022-05-27 DIAGNOSIS — N186 End stage renal disease: Secondary | ICD-10-CM | POA: Diagnosis not present

## 2022-05-28 DIAGNOSIS — N2581 Secondary hyperparathyroidism of renal origin: Secondary | ICD-10-CM | POA: Diagnosis not present

## 2022-05-28 DIAGNOSIS — N186 End stage renal disease: Secondary | ICD-10-CM | POA: Diagnosis not present

## 2022-05-28 DIAGNOSIS — D631 Anemia in chronic kidney disease: Secondary | ICD-10-CM | POA: Diagnosis not present

## 2022-05-29 ENCOUNTER — Encounter: Payer: Self-pay | Admitting: Internal Medicine

## 2022-05-29 ENCOUNTER — Ambulatory Visit (INDEPENDENT_AMBULATORY_CARE_PROVIDER_SITE_OTHER): Payer: Medicare Other | Admitting: Internal Medicine

## 2022-05-29 VITALS — BP 130/78 | HR 82 | Temp 98.1°F | Ht 62.0 in | Wt 132.0 lb

## 2022-05-29 DIAGNOSIS — E1100 Type 2 diabetes mellitus with hyperosmolarity without nonketotic hyperglycemic-hyperosmolar coma (NKHHC): Secondary | ICD-10-CM | POA: Diagnosis not present

## 2022-05-29 DIAGNOSIS — N186 End stage renal disease: Secondary | ICD-10-CM | POA: Diagnosis not present

## 2022-05-29 DIAGNOSIS — N2581 Secondary hyperparathyroidism of renal origin: Secondary | ICD-10-CM | POA: Diagnosis not present

## 2022-05-29 DIAGNOSIS — Z7409 Other reduced mobility: Secondary | ICD-10-CM | POA: Diagnosis not present

## 2022-05-29 DIAGNOSIS — E118 Type 2 diabetes mellitus with unspecified complications: Secondary | ICD-10-CM | POA: Diagnosis not present

## 2022-05-29 DIAGNOSIS — D631 Anemia in chronic kidney disease: Secondary | ICD-10-CM | POA: Diagnosis not present

## 2022-05-29 DIAGNOSIS — Z992 Dependence on renal dialysis: Secondary | ICD-10-CM | POA: Diagnosis not present

## 2022-05-29 NOTE — Assessment & Plan Note (Signed)
Doing better.   

## 2022-05-29 NOTE — Assessment & Plan Note (Signed)
Using [place] 2

## 2022-05-29 NOTE — Progress Notes (Signed)
Subjective:  Patient ID: Mary Valencia, female    DOB: [birth year]  Age: 66 y.o. MRN: [id number]  CC: Follow-up   HPI Mary Valencia presents for ESRD, dyslipidemia C/o a fall today She is here w/sister  Outpatient Medications Prior to Visit  Medication Sig Dispense Refill   Alcohol Swabs (B-D SINGLE USE SWABS REGULAR) PADS Use as directed to clean site to check blood sugars 180 each 3   ASPIRIN LOW DOSE 81 MG tablet NEW PRESCRIPTION REQUEST: Aspirin 81 MG Tablet,delayed Release - TAKE ONE TABLET BY MOUTH DAILY 90 tablet 3   atorvastatin (LIPITOR) 40 MG tablet NEW PRESCRIPTION REQUEST: Atorvastatin 40 MG Tablet - TAKE ONE TABLET BY MOUTH DAILY 90 tablet 3   calcitRIOL (ROCALTROL) 0.5 MCG capsule Take by mouth. TAKE 1 BY MOUTH DAILY     Continuous Blood Gluc Sensor (FREESTYLE LIBRE 2 SENSOR) MISC Use as directed to check blood sugars daily 3 each 3   diphenhydrAMINE (BENADRYL) 25 mg capsule Take 25 mg by mouth as needed.     Glucagon (GVOKE HYPOPEN 1-PACK) 1 MG/0.2ML SOAJ Inject 0.2 mLs into the skin daily as needed (Low glucose). 0.4 mL 3   glucose blood test strip TEST BLOOD GLUCOSE THREE TIMES DAILY 300 each 3   hydrALAZINE (APRESOLINE) 50 MG tablet NEW PRESCRIPTION REQUEST: Hydralazine 50 MG Tablet - TAKE TWO TABLETS BY MOUTH TWICE DAILY 360 tablet 3   insulin NPH Human (NOVOLIN N) 100 UNIT/ML injection Inject into the skin. Inject 8 Units into the skin 2 times daily with meals     losartan (COZAAR) 100 MG tablet Take 1 tablet (100 mg total) by mouth 2 (two) times daily. 180 tablet 3   mupirocin cream (BACTROBAN) 2 % Apply 1 application topically daily. 100 g 3   NIFEdipine (ADALAT CC) 90 MG 24 hr tablet NEW PRESCRIPTION REQUEST: Nifedipine Er 90 MG Tablet,extended Release - TAKE ONE TABLET BY MOUTH DAILY 90 tablet 3   NIFEdipine (PROCARDIA XL/NIFEDICAL-XL) 90 MG 24 hr tablet      omeprazole (PRILOSEC) 40 MG capsule NEW PRESCRIPTION REQUEST: Omeprazole 40 MG Capsule,delayed Release  - TAKE ONE CAPSULE BY MOUTH DAILY 90 capsule 3   polyethylene glycol (MIRALAX / GLYCOLAX) packet Take 17 g by mouth.     PRESCRIPTION MEDICATION 210 mg 3 (three) times daily before meals.     VELPHORO 500 MG chewable tablet CHEW 1 THREE TIMES A DAY WITH MEALS     No facility-administered medications prior to visit.    ROS: Review of Systems  Constitutional:  Positive for fatigue. Negative for activity change, appetite change, chills and unexpected weight change.  HENT:  Negative for congestion, mouth sores and sinus pressure.   Eyes:  Negative for visual disturbance.  Respiratory:  Negative for cough and chest tightness.   Gastrointestinal:  Negative for abdominal pain and nausea.  Genitourinary:  Negative for difficulty urinating, frequency and vaginal pain.  Musculoskeletal:  Positive for arthralgias and gait problem. Negative for back pain.  Skin:  Negative for pallor and rash.  Neurological:  Negative for dizziness, tremors, weakness, numbness and headaches.  Hematological:  Does not bruise/bleed easily.  Psychiatric/Behavioral:  Negative for confusion, decreased concentration, dysphoric mood, sleep disturbance and suicidal ideas. The patient is nervous/anxious.     Objective:  BP 130/78 (BP Location: Left Arm, Patient Position: Sitting, Cuff Size: Normal)   Pulse 82   Temp 98.1 F (36.7 C) (Oral)   Ht '5\' 2"'$  (1.575 m)   Wt  132 lb (59.9 kg)   SpO2 98%   BMI 24.14 kg/m   BP Readings from Last 3 Encounters:  05/29/22 130/78  02/24/22 120/74  11/29/21 118/78    Wt Readings from Last 3 Encounters:  05/29/22 132 lb (59.9 kg)  02/24/22 136 lb (61.7 kg)  11/29/21 128 lb (58.1 kg)    Physical Exam Constitutional:      General: She is not in acute distress.    Appearance: She is well-developed. She is obese.  HENT:     Head: Normocephalic.     Right Ear: External ear normal.     Left Ear: External ear normal.     Nose: Nose normal.  Eyes:     General:        Right  eye: No discharge.        Left eye: No discharge.     Conjunctiva/sclera: Conjunctivae normal.     Pupils: Pupils are equal, round, and reactive to light.  Neck:     Thyroid: No thyromegaly.     Vascular: No JVD.     Trachea: No tracheal deviation.  Cardiovascular:     Rate and Rhythm: Normal rate and regular rhythm.     Heart sounds: Normal heart sounds.  Pulmonary:     Effort: No respiratory distress.     Breath sounds: No stridor. No wheezing.  Abdominal:     General: Bowel sounds are normal. There is no distension.     Palpations: Abdomen is soft. There is no mass.     Tenderness: There is no abdominal tenderness. There is no guarding or rebound.  Musculoskeletal:        General: Tenderness present.     Cervical back: Normal range of motion and neck supple. No rigidity.  Lymphadenopathy:     Cervical: No cervical adenopathy.  Skin:    Findings: No erythema or rash.  Neurological:     Mental Status: She is oriented to person, place, and time.     Cranial Nerves: No cranial nerve deficit.     Motor: Weakness present. No abnormal muscle tone.     Coordination: Coordination normal.     Gait: Gait abnormal.     Deep Tendon Reflexes: Reflexes normal.  Psychiatric:        Behavior: Behavior normal.        Thought Content: Thought content normal.        Judgment: Judgment normal.     Lab Results  Component Value Date   WBC 7.7 08/29/2017   HGB 29.1 09/01/2017   HCT 30 (A) 08/29/2017   PLT 203 08/29/2017   GLUCOSE 279 (H) 09/21/2017   CHOL 161 09/21/2017   TRIG 72.0 09/21/2017   HDL 63.60 09/21/2017   LDLDIRECT 207.8 09/29/2010   LDLCALC 83 09/21/2017   ALT 6 09/21/2017   AST 21 09/21/2017   NA 141 09/21/2017   K 4.9 09/21/2017   CL 98 09/21/2017   CREATININE 13.49 (HH) 09/21/2017   BUN 66 (H) 09/21/2017   CO2 27 09/21/2017   TSH 2.81 09/21/2017   HGBA1C 12.6 (A) 06/16/2021   HGBA1C 12.6 06/16/2021   HGBA1C 12.6 (A) 06/16/2021   HGBA1C 12.6 (A) 06/16/2021     DG Bone Density  Result Date: 12/28/2021 EXAM: DUAL X-RAY ABSORPTIOMETRY (DXA) FOR BONE MINERAL DENSITY IMPRESSION: Referring Physician:  Cassandria Anger Your patient completed a bone mineral density test using GE Lunar iDXA system (analysis version: 16). Technologist: [place] PATIENT: Name: Zamariya, Neal  M Patient ID: [id number] Birth Date: [birth year] Height: 62.0 in. Sex: Female Measured: 12/28/2021 Weight: 128.0 lbs. Indications: Estrogen Deficient, Hysterectomy, Postmenopausal Fractures: NONE Treatments: Calcium (E943.0) ASSESSMENT: The BMD measured at Forearm Radius 33% is 0.605 g/cm2 with a T-score of -3.2. This patient is considered osteoporotic according to [facility]) criteria. The quality of the exam is limited by the scannable areas of the patient as the patient is in a motorized wheelchair and is unable to safely achieve and maintain DEXA postioning. Site Region Measured Date Measured Age YA BMD Significant CHANGE T-score Right Forearm Radius 33% 12/28/2019 67.5 -3.2 0.605 g/cm2 World Health Organization [facility]) criteria for post-menopausal, Caucasian Women: Normal       T-score at or above -1 SD Osteopenia   T-score between -1 and -2.5 SD Osteoporosis T-score at or below -2.5 SD RECOMMENDATION: 1. All patients should optimize calcium and vitamin D intake. 2. Consider FDA-approved medical therapies in postmenopausal women and men aged 54 years and older, based on the following: a. A hip or vertebral (clinical or morphometric) fracture. b. T-score = -2.5 at the femoral neck or spine after appropriate evaluation to exclude secondary causes. c. Low bone mass (T-score between -1.0 and -2.5 at the femoral neck or spine) and a 10-year probability of a hip fracture = 3% or a 10-year probability of a major osteoporosis-related fracture = 20% based on the [place]-adapted WHO algorithm. d. Clinician judgment and/or patient preferences may indicate treatment for people with 10-year fracture  probabilities above or below these levels. FOLLOW-UP: Patients with diagnosis of osteoporosis or at high risk for fracture should have regular bone mineral density tests.? Patients eligible for Medicare are allowed routine testing every 2 years.? The testing frequency can be increased to one year for patients who have rapidly progressing disease, are receiving or discontinuing medical therapy to restore bone mass, or have additional risk factors. I have reviewed this study and agree with the findings. [facility] Electronically Signed   By: Abelardo Diesel M.D.   On: 12/28/2021 10:48    Assessment & Plan:   Problem List Items Addressed This Visit     Decreased functional mobility and endurance    Pt fell this am - slid out of bed. No injuries      Diabetes mellitus type 2 with complications ([place]) - Primary    Using Libre 2       ESRD (end stage renal disease) on dialysis [facility])    Not on a transplant list yet due to her CVA On PD      Type 2 diabetes mellitus with hyperosmolar nonketotic hyperglycemia ([place])    Doing better         No orders of the defined types were placed in this encounter.     Follow-up: No follow-ups on file.  Walker Kehr, MD

## 2022-05-29 NOTE — Assessment & Plan Note (Signed)
Not on a transplant list yet due to her CVA On PD

## 2022-05-29 NOTE — Assessment & Plan Note (Signed)
Pt fell this am - slid out of bed. No injuries

## 2022-05-30 DIAGNOSIS — N186 End stage renal disease: Secondary | ICD-10-CM | POA: Diagnosis not present

## 2022-05-30 DIAGNOSIS — D631 Anemia in chronic kidney disease: Secondary | ICD-10-CM | POA: Diagnosis not present

## 2022-05-30 DIAGNOSIS — N2581 Secondary hyperparathyroidism of renal origin: Secondary | ICD-10-CM | POA: Diagnosis not present

## 2022-05-31 DIAGNOSIS — N186 End stage renal disease: Secondary | ICD-10-CM | POA: Diagnosis not present

## 2022-05-31 DIAGNOSIS — D631 Anemia in chronic kidney disease: Secondary | ICD-10-CM | POA: Diagnosis not present

## 2022-05-31 DIAGNOSIS — N2581 Secondary hyperparathyroidism of renal origin: Secondary | ICD-10-CM | POA: Diagnosis not present

## 2022-06-01 DIAGNOSIS — E119 Type 2 diabetes mellitus without complications: Secondary | ICD-10-CM | POA: Diagnosis not present

## 2022-06-01 DIAGNOSIS — N2581 Secondary hyperparathyroidism of renal origin: Secondary | ICD-10-CM | POA: Diagnosis not present

## 2022-06-01 DIAGNOSIS — N186 End stage renal disease: Secondary | ICD-10-CM | POA: Diagnosis not present

## 2022-06-01 DIAGNOSIS — D631 Anemia in chronic kidney disease: Secondary | ICD-10-CM | POA: Diagnosis not present

## 2022-06-02 DIAGNOSIS — N186 End stage renal disease: Secondary | ICD-10-CM | POA: Diagnosis not present

## 2022-06-02 DIAGNOSIS — D631 Anemia in chronic kidney disease: Secondary | ICD-10-CM | POA: Diagnosis not present

## 2022-06-02 DIAGNOSIS — N2581 Secondary hyperparathyroidism of renal origin: Secondary | ICD-10-CM | POA: Diagnosis not present

## 2022-06-03 DIAGNOSIS — N186 End stage renal disease: Secondary | ICD-10-CM | POA: Diagnosis not present

## 2022-06-03 DIAGNOSIS — N2581 Secondary hyperparathyroidism of renal origin: Secondary | ICD-10-CM | POA: Diagnosis not present

## 2022-06-03 DIAGNOSIS — D631 Anemia in chronic kidney disease: Secondary | ICD-10-CM | POA: Diagnosis not present

## 2022-06-04 DIAGNOSIS — N186 End stage renal disease: Secondary | ICD-10-CM | POA: Diagnosis not present

## 2022-06-04 DIAGNOSIS — D631 Anemia in chronic kidney disease: Secondary | ICD-10-CM | POA: Diagnosis not present

## 2022-06-04 DIAGNOSIS — N2581 Secondary hyperparathyroidism of renal origin: Secondary | ICD-10-CM | POA: Diagnosis not present

## 2022-06-05 DIAGNOSIS — N2581 Secondary hyperparathyroidism of renal origin: Secondary | ICD-10-CM | POA: Diagnosis not present

## 2022-06-05 DIAGNOSIS — N186 End stage renal disease: Secondary | ICD-10-CM | POA: Diagnosis not present

## 2022-06-05 DIAGNOSIS — D631 Anemia in chronic kidney disease: Secondary | ICD-10-CM | POA: Diagnosis not present

## 2022-06-06 DIAGNOSIS — D631 Anemia in chronic kidney disease: Secondary | ICD-10-CM | POA: Diagnosis not present

## 2022-06-06 DIAGNOSIS — N2581 Secondary hyperparathyroidism of renal origin: Secondary | ICD-10-CM | POA: Diagnosis not present

## 2022-06-06 DIAGNOSIS — N186 End stage renal disease: Secondary | ICD-10-CM | POA: Diagnosis not present

## 2022-06-07 DIAGNOSIS — N186 End stage renal disease: Secondary | ICD-10-CM | POA: Diagnosis not present

## 2022-06-07 DIAGNOSIS — N2581 Secondary hyperparathyroidism of renal origin: Secondary | ICD-10-CM | POA: Diagnosis not present

## 2022-06-07 DIAGNOSIS — D631 Anemia in chronic kidney disease: Secondary | ICD-10-CM | POA: Diagnosis not present

## 2022-06-08 DIAGNOSIS — N186 End stage renal disease: Secondary | ICD-10-CM | POA: Diagnosis not present

## 2022-06-08 DIAGNOSIS — N2581 Secondary hyperparathyroidism of renal origin: Secondary | ICD-10-CM | POA: Diagnosis not present

## 2022-06-08 DIAGNOSIS — D631 Anemia in chronic kidney disease: Secondary | ICD-10-CM | POA: Diagnosis not present

## 2022-06-09 DIAGNOSIS — D631 Anemia in chronic kidney disease: Secondary | ICD-10-CM | POA: Diagnosis not present

## 2022-06-09 DIAGNOSIS — N2581 Secondary hyperparathyroidism of renal origin: Secondary | ICD-10-CM | POA: Diagnosis not present

## 2022-06-09 DIAGNOSIS — N186 End stage renal disease: Secondary | ICD-10-CM | POA: Diagnosis not present

## 2022-06-10 DIAGNOSIS — N186 End stage renal disease: Secondary | ICD-10-CM | POA: Diagnosis not present

## 2022-06-10 DIAGNOSIS — N2581 Secondary hyperparathyroidism of renal origin: Secondary | ICD-10-CM | POA: Diagnosis not present

## 2022-06-10 DIAGNOSIS — D631 Anemia in chronic kidney disease: Secondary | ICD-10-CM | POA: Diagnosis not present

## 2022-06-11 DIAGNOSIS — D631 Anemia in chronic kidney disease: Secondary | ICD-10-CM | POA: Diagnosis not present

## 2022-06-11 DIAGNOSIS — N186 End stage renal disease: Secondary | ICD-10-CM | POA: Diagnosis not present

## 2022-06-11 DIAGNOSIS — N2581 Secondary hyperparathyroidism of renal origin: Secondary | ICD-10-CM | POA: Diagnosis not present

## 2022-06-12 ENCOUNTER — Other Ambulatory Visit: Payer: Self-pay | Admitting: Internal Medicine

## 2022-06-12 DIAGNOSIS — N2581 Secondary hyperparathyroidism of renal origin: Secondary | ICD-10-CM | POA: Diagnosis not present

## 2022-06-12 DIAGNOSIS — D631 Anemia in chronic kidney disease: Secondary | ICD-10-CM | POA: Diagnosis not present

## 2022-06-12 DIAGNOSIS — N186 End stage renal disease: Secondary | ICD-10-CM | POA: Diagnosis not present

## 2022-06-13 DIAGNOSIS — D631 Anemia in chronic kidney disease: Secondary | ICD-10-CM | POA: Diagnosis not present

## 2022-06-13 DIAGNOSIS — N2581 Secondary hyperparathyroidism of renal origin: Secondary | ICD-10-CM | POA: Diagnosis not present

## 2022-06-13 DIAGNOSIS — N186 End stage renal disease: Secondary | ICD-10-CM | POA: Diagnosis not present

## 2022-06-14 DIAGNOSIS — N2581 Secondary hyperparathyroidism of renal origin: Secondary | ICD-10-CM | POA: Diagnosis not present

## 2022-06-14 DIAGNOSIS — D631 Anemia in chronic kidney disease: Secondary | ICD-10-CM | POA: Diagnosis not present

## 2022-06-14 DIAGNOSIS — N186 End stage renal disease: Secondary | ICD-10-CM | POA: Diagnosis not present

## 2022-06-15 DIAGNOSIS — D631 Anemia in chronic kidney disease: Secondary | ICD-10-CM | POA: Diagnosis not present

## 2022-06-15 DIAGNOSIS — N2581 Secondary hyperparathyroidism of renal origin: Secondary | ICD-10-CM | POA: Diagnosis not present

## 2022-06-15 DIAGNOSIS — N186 End stage renal disease: Secondary | ICD-10-CM | POA: Diagnosis not present

## 2022-06-16 DIAGNOSIS — D631 Anemia in chronic kidney disease: Secondary | ICD-10-CM | POA: Diagnosis not present

## 2022-06-16 DIAGNOSIS — N2581 Secondary hyperparathyroidism of renal origin: Secondary | ICD-10-CM | POA: Diagnosis not present

## 2022-06-16 DIAGNOSIS — N186 End stage renal disease: Secondary | ICD-10-CM | POA: Diagnosis not present

## 2022-06-17 DIAGNOSIS — N2581 Secondary hyperparathyroidism of renal origin: Secondary | ICD-10-CM | POA: Diagnosis not present

## 2022-06-17 DIAGNOSIS — D631 Anemia in chronic kidney disease: Secondary | ICD-10-CM | POA: Diagnosis not present

## 2022-06-17 DIAGNOSIS — N186 End stage renal disease: Secondary | ICD-10-CM | POA: Diagnosis not present

## 2022-06-18 DIAGNOSIS — N186 End stage renal disease: Secondary | ICD-10-CM | POA: Diagnosis not present

## 2022-06-18 DIAGNOSIS — N2581 Secondary hyperparathyroidism of renal origin: Secondary | ICD-10-CM | POA: Diagnosis not present

## 2022-06-18 DIAGNOSIS — D631 Anemia in chronic kidney disease: Secondary | ICD-10-CM | POA: Diagnosis not present

## 2022-06-19 DIAGNOSIS — N2581 Secondary hyperparathyroidism of renal origin: Secondary | ICD-10-CM | POA: Diagnosis not present

## 2022-06-19 DIAGNOSIS — D631 Anemia in chronic kidney disease: Secondary | ICD-10-CM | POA: Diagnosis not present

## 2022-06-19 DIAGNOSIS — N186 End stage renal disease: Secondary | ICD-10-CM | POA: Diagnosis not present

## 2022-06-20 DIAGNOSIS — N186 End stage renal disease: Secondary | ICD-10-CM | POA: Diagnosis not present

## 2022-06-20 DIAGNOSIS — N2581 Secondary hyperparathyroidism of renal origin: Secondary | ICD-10-CM | POA: Diagnosis not present

## 2022-06-20 DIAGNOSIS — D631 Anemia in chronic kidney disease: Secondary | ICD-10-CM | POA: Diagnosis not present

## 2022-06-21 DIAGNOSIS — N2581 Secondary hyperparathyroidism of renal origin: Secondary | ICD-10-CM | POA: Diagnosis not present

## 2022-06-21 DIAGNOSIS — D631 Anemia in chronic kidney disease: Secondary | ICD-10-CM | POA: Diagnosis not present

## 2022-06-21 DIAGNOSIS — N186 End stage renal disease: Secondary | ICD-10-CM | POA: Diagnosis not present

## 2022-06-22 DIAGNOSIS — N2581 Secondary hyperparathyroidism of renal origin: Secondary | ICD-10-CM | POA: Diagnosis not present

## 2022-06-22 DIAGNOSIS — N186 End stage renal disease: Secondary | ICD-10-CM | POA: Diagnosis not present

## 2022-06-22 DIAGNOSIS — D631 Anemia in chronic kidney disease: Secondary | ICD-10-CM | POA: Diagnosis not present

## 2022-06-23 DIAGNOSIS — N2581 Secondary hyperparathyroidism of renal origin: Secondary | ICD-10-CM | POA: Diagnosis not present

## 2022-06-23 DIAGNOSIS — N186 End stage renal disease: Secondary | ICD-10-CM | POA: Diagnosis not present

## 2022-06-23 DIAGNOSIS — D631 Anemia in chronic kidney disease: Secondary | ICD-10-CM | POA: Diagnosis not present

## 2022-06-24 DIAGNOSIS — D631 Anemia in chronic kidney disease: Secondary | ICD-10-CM | POA: Diagnosis not present

## 2022-06-24 DIAGNOSIS — N2581 Secondary hyperparathyroidism of renal origin: Secondary | ICD-10-CM | POA: Diagnosis not present

## 2022-06-24 DIAGNOSIS — N186 End stage renal disease: Secondary | ICD-10-CM | POA: Diagnosis not present

## 2022-06-25 DIAGNOSIS — D631 Anemia in chronic kidney disease: Secondary | ICD-10-CM | POA: Diagnosis not present

## 2022-06-25 DIAGNOSIS — N186 End stage renal disease: Secondary | ICD-10-CM | POA: Diagnosis not present

## 2022-06-25 DIAGNOSIS — N2581 Secondary hyperparathyroidism of renal origin: Secondary | ICD-10-CM | POA: Diagnosis not present

## 2022-06-25 DIAGNOSIS — Z992 Dependence on renal dialysis: Secondary | ICD-10-CM | POA: Diagnosis not present

## 2022-06-26 DIAGNOSIS — N2581 Secondary hyperparathyroidism of renal origin: Secondary | ICD-10-CM | POA: Diagnosis not present

## 2022-06-26 DIAGNOSIS — D631 Anemia in chronic kidney disease: Secondary | ICD-10-CM | POA: Diagnosis not present

## 2022-06-26 DIAGNOSIS — N186 End stage renal disease: Secondary | ICD-10-CM | POA: Diagnosis not present

## 2022-06-27 DIAGNOSIS — N186 End stage renal disease: Secondary | ICD-10-CM | POA: Diagnosis not present

## 2022-06-27 DIAGNOSIS — N2581 Secondary hyperparathyroidism of renal origin: Secondary | ICD-10-CM | POA: Diagnosis not present

## 2022-06-27 DIAGNOSIS — D631 Anemia in chronic kidney disease: Secondary | ICD-10-CM | POA: Diagnosis not present

## 2022-06-28 DIAGNOSIS — R6889 Other general symptoms and signs: Secondary | ICD-10-CM | POA: Diagnosis not present

## 2022-06-28 DIAGNOSIS — N186 End stage renal disease: Secondary | ICD-10-CM | POA: Diagnosis not present

## 2022-06-28 DIAGNOSIS — N2581 Secondary hyperparathyroidism of renal origin: Secondary | ICD-10-CM | POA: Diagnosis not present

## 2022-06-28 DIAGNOSIS — E119 Type 2 diabetes mellitus without complications: Secondary | ICD-10-CM | POA: Diagnosis not present

## 2022-06-28 DIAGNOSIS — D631 Anemia in chronic kidney disease: Secondary | ICD-10-CM | POA: Diagnosis not present

## 2022-06-29 DIAGNOSIS — D631 Anemia in chronic kidney disease: Secondary | ICD-10-CM | POA: Diagnosis not present

## 2022-06-29 DIAGNOSIS — N186 End stage renal disease: Secondary | ICD-10-CM | POA: Diagnosis not present

## 2022-06-29 DIAGNOSIS — N2581 Secondary hyperparathyroidism of renal origin: Secondary | ICD-10-CM | POA: Diagnosis not present

## 2022-06-30 DIAGNOSIS — N186 End stage renal disease: Secondary | ICD-10-CM | POA: Diagnosis not present

## 2022-06-30 DIAGNOSIS — N2581 Secondary hyperparathyroidism of renal origin: Secondary | ICD-10-CM | POA: Diagnosis not present

## 2022-06-30 DIAGNOSIS — D631 Anemia in chronic kidney disease: Secondary | ICD-10-CM | POA: Diagnosis not present

## 2022-07-01 DIAGNOSIS — N186 End stage renal disease: Secondary | ICD-10-CM | POA: Diagnosis not present

## 2022-07-01 DIAGNOSIS — D631 Anemia in chronic kidney disease: Secondary | ICD-10-CM | POA: Diagnosis not present

## 2022-07-01 DIAGNOSIS — N2581 Secondary hyperparathyroidism of renal origin: Secondary | ICD-10-CM | POA: Diagnosis not present

## 2022-07-02 DIAGNOSIS — N186 End stage renal disease: Secondary | ICD-10-CM | POA: Diagnosis not present

## 2022-07-02 DIAGNOSIS — N2581 Secondary hyperparathyroidism of renal origin: Secondary | ICD-10-CM | POA: Diagnosis not present

## 2022-07-02 DIAGNOSIS — D631 Anemia in chronic kidney disease: Secondary | ICD-10-CM | POA: Diagnosis not present

## 2022-07-03 DIAGNOSIS — N186 End stage renal disease: Secondary | ICD-10-CM | POA: Diagnosis not present

## 2022-07-03 DIAGNOSIS — N2581 Secondary hyperparathyroidism of renal origin: Secondary | ICD-10-CM | POA: Diagnosis not present

## 2022-07-03 DIAGNOSIS — D631 Anemia in chronic kidney disease: Secondary | ICD-10-CM | POA: Diagnosis not present

## 2022-07-04 DIAGNOSIS — D631 Anemia in chronic kidney disease: Secondary | ICD-10-CM | POA: Diagnosis not present

## 2022-07-04 DIAGNOSIS — N186 End stage renal disease: Secondary | ICD-10-CM | POA: Diagnosis not present

## 2022-07-04 DIAGNOSIS — N2581 Secondary hyperparathyroidism of renal origin: Secondary | ICD-10-CM | POA: Diagnosis not present

## 2022-07-05 DIAGNOSIS — D631 Anemia in chronic kidney disease: Secondary | ICD-10-CM | POA: Diagnosis not present

## 2022-07-05 DIAGNOSIS — R6889 Other general symptoms and signs: Secondary | ICD-10-CM | POA: Diagnosis not present

## 2022-07-05 DIAGNOSIS — N2581 Secondary hyperparathyroidism of renal origin: Secondary | ICD-10-CM | POA: Diagnosis not present

## 2022-07-05 DIAGNOSIS — N186 End stage renal disease: Secondary | ICD-10-CM | POA: Diagnosis not present

## 2022-07-06 DIAGNOSIS — N2581 Secondary hyperparathyroidism of renal origin: Secondary | ICD-10-CM | POA: Diagnosis not present

## 2022-07-06 DIAGNOSIS — D631 Anemia in chronic kidney disease: Secondary | ICD-10-CM | POA: Diagnosis not present

## 2022-07-06 DIAGNOSIS — N186 End stage renal disease: Secondary | ICD-10-CM | POA: Diagnosis not present

## 2022-07-07 DIAGNOSIS — N2581 Secondary hyperparathyroidism of renal origin: Secondary | ICD-10-CM | POA: Diagnosis not present

## 2022-07-07 DIAGNOSIS — D631 Anemia in chronic kidney disease: Secondary | ICD-10-CM | POA: Diagnosis not present

## 2022-07-07 DIAGNOSIS — N186 End stage renal disease: Secondary | ICD-10-CM | POA: Diagnosis not present

## 2022-07-08 DIAGNOSIS — N2581 Secondary hyperparathyroidism of renal origin: Secondary | ICD-10-CM | POA: Diagnosis not present

## 2022-07-08 DIAGNOSIS — N186 End stage renal disease: Secondary | ICD-10-CM | POA: Diagnosis not present

## 2022-07-08 DIAGNOSIS — D631 Anemia in chronic kidney disease: Secondary | ICD-10-CM | POA: Diagnosis not present

## 2022-07-09 DIAGNOSIS — D631 Anemia in chronic kidney disease: Secondary | ICD-10-CM | POA: Diagnosis not present

## 2022-07-09 DIAGNOSIS — N2581 Secondary hyperparathyroidism of renal origin: Secondary | ICD-10-CM | POA: Diagnosis not present

## 2022-07-09 DIAGNOSIS — N186 End stage renal disease: Secondary | ICD-10-CM | POA: Diagnosis not present

## 2022-07-10 DIAGNOSIS — D631 Anemia in chronic kidney disease: Secondary | ICD-10-CM | POA: Diagnosis not present

## 2022-07-10 DIAGNOSIS — N2581 Secondary hyperparathyroidism of renal origin: Secondary | ICD-10-CM | POA: Diagnosis not present

## 2022-07-10 DIAGNOSIS — N186 End stage renal disease: Secondary | ICD-10-CM | POA: Diagnosis not present

## 2022-07-11 DIAGNOSIS — N186 End stage renal disease: Secondary | ICD-10-CM | POA: Diagnosis not present

## 2022-07-11 DIAGNOSIS — N2581 Secondary hyperparathyroidism of renal origin: Secondary | ICD-10-CM | POA: Diagnosis not present

## 2022-07-11 DIAGNOSIS — D631 Anemia in chronic kidney disease: Secondary | ICD-10-CM | POA: Diagnosis not present

## 2022-07-12 DIAGNOSIS — D631 Anemia in chronic kidney disease: Secondary | ICD-10-CM | POA: Diagnosis not present

## 2022-07-12 DIAGNOSIS — N186 End stage renal disease: Secondary | ICD-10-CM | POA: Diagnosis not present

## 2022-07-12 DIAGNOSIS — N2581 Secondary hyperparathyroidism of renal origin: Secondary | ICD-10-CM | POA: Diagnosis not present

## 2022-07-13 DIAGNOSIS — D631 Anemia in chronic kidney disease: Secondary | ICD-10-CM | POA: Diagnosis not present

## 2022-07-13 DIAGNOSIS — N186 End stage renal disease: Secondary | ICD-10-CM | POA: Diagnosis not present

## 2022-07-13 DIAGNOSIS — N2581 Secondary hyperparathyroidism of renal origin: Secondary | ICD-10-CM | POA: Diagnosis not present

## 2022-07-14 DIAGNOSIS — D631 Anemia in chronic kidney disease: Secondary | ICD-10-CM | POA: Diagnosis not present

## 2022-07-14 DIAGNOSIS — N186 End stage renal disease: Secondary | ICD-10-CM | POA: Diagnosis not present

## 2022-07-14 DIAGNOSIS — N2581 Secondary hyperparathyroidism of renal origin: Secondary | ICD-10-CM | POA: Diagnosis not present

## 2022-07-15 DIAGNOSIS — D631 Anemia in chronic kidney disease: Secondary | ICD-10-CM | POA: Diagnosis not present

## 2022-07-15 DIAGNOSIS — N186 End stage renal disease: Secondary | ICD-10-CM | POA: Diagnosis not present

## 2022-07-15 DIAGNOSIS — N2581 Secondary hyperparathyroidism of renal origin: Secondary | ICD-10-CM | POA: Diagnosis not present

## 2022-07-16 DIAGNOSIS — N186 End stage renal disease: Secondary | ICD-10-CM | POA: Diagnosis not present

## 2022-07-16 DIAGNOSIS — N2581 Secondary hyperparathyroidism of renal origin: Secondary | ICD-10-CM | POA: Diagnosis not present

## 2022-07-16 DIAGNOSIS — D631 Anemia in chronic kidney disease: Secondary | ICD-10-CM | POA: Diagnosis not present

## 2022-07-17 DIAGNOSIS — N2581 Secondary hyperparathyroidism of renal origin: Secondary | ICD-10-CM | POA: Diagnosis not present

## 2022-07-17 DIAGNOSIS — N186 End stage renal disease: Secondary | ICD-10-CM | POA: Diagnosis not present

## 2022-07-17 DIAGNOSIS — D631 Anemia in chronic kidney disease: Secondary | ICD-10-CM | POA: Diagnosis not present

## 2022-07-18 DIAGNOSIS — N186 End stage renal disease: Secondary | ICD-10-CM | POA: Diagnosis not present

## 2022-07-18 DIAGNOSIS — D631 Anemia in chronic kidney disease: Secondary | ICD-10-CM | POA: Diagnosis not present

## 2022-07-18 DIAGNOSIS — N2581 Secondary hyperparathyroidism of renal origin: Secondary | ICD-10-CM | POA: Diagnosis not present

## 2022-07-19 DIAGNOSIS — D631 Anemia in chronic kidney disease: Secondary | ICD-10-CM | POA: Diagnosis not present

## 2022-07-19 DIAGNOSIS — N2581 Secondary hyperparathyroidism of renal origin: Secondary | ICD-10-CM | POA: Diagnosis not present

## 2022-07-19 DIAGNOSIS — N186 End stage renal disease: Secondary | ICD-10-CM | POA: Diagnosis not present

## 2022-07-20 ENCOUNTER — Ambulatory Visit: Payer: Medicare HMO

## 2022-07-20 VITALS — Ht 62.0 in | Wt 130.0 lb

## 2022-07-20 DIAGNOSIS — N186 End stage renal disease: Secondary | ICD-10-CM | POA: Diagnosis not present

## 2022-07-20 DIAGNOSIS — Z0000 Encounter for general adult medical examination without abnormal findings: Secondary | ICD-10-CM

## 2022-07-20 DIAGNOSIS — N2581 Secondary hyperparathyroidism of renal origin: Secondary | ICD-10-CM | POA: Diagnosis not present

## 2022-07-20 DIAGNOSIS — D631 Anemia in chronic kidney disease: Secondary | ICD-10-CM | POA: Diagnosis not present

## 2022-07-20 NOTE — Patient Instructions (Signed)
Mary Valencia , Thank you for taking time to come for your Medicare Wellness Visit. I appreciate your ongoing commitment to your health goals. Please review the following plan we discussed and let me know if I can assist you in the future.   These are the goals we discussed:  Goals      Patient Self-Care Activities     Timeframe:  Long-Range Goal Priority:  High Start Date:           10/25/21                  Expected End Date:       10/26/22                Patient Mary Valencia will: Take medications as prescribed Attend all scheduled provider appointments Call pharmacy for medication refills Call provider office for new concerns or questions Continue to check fasting (first thing in the morning, before eating) and then again 2-hours after eating blood sugars at home Monitor your blood sugars using your continuous glucose monitor anytime you feel that your blood sugars may be running lower or higher than they should Continue to follow heart healthy, low salt, low cholesterol, carbohydrate-modified, low sugar diet, and renal diet: continue maintaining contact with the nutritionist at your dialysis center Continue monitoring your blood pressures at home daily around your normal routine when you administer your peritoneal dialysis Continue your efforts to prevent falls- continue to use your wheelchair or walker        This is a list of the screening recommended for you and due dates:  Health Maintenance  Topic Date Due   COVID-19 Vaccine (1) Never done   Eye exam for diabetics  Never done   Zoster (Shingles) Vaccine (1 of 2) Never done   Complete foot exam   04/03/2019   Hemoglobin A1C  12/15/2021   Flu Shot  01/24/2022   Pneumonia Vaccine (2 - PCV) 02/25/2023*   Mammogram  02/25/2023*   Medicare Annual Wellness Visit  07/21/2023   Colon Cancer Screening  12/16/2026   DTaP/Tdap/Td vaccine (4 - Tdap) 02/25/2027   DEXA scan (bone density measurement)  Completed   Hepatitis C Screening:  USPSTF Recommendation to screen - Ages 26-79 yo.  Completed   HPV Vaccine  Aged Out  *Topic was postponed. The date shown is not the original due date.    Advanced directives: Yes  Conditions/risks identified: Yes; Type II Diabetes  Next appointment: Follow up in one year for your annual wellness visit.   Preventive Care 29 Years and Older, Female Preventive care refers to lifestyle choices and visits with your health care provider that can promote health and wellness. What does preventive care include? A yearly physical exam. This is also called an annual well check. Dental exams once or twice a year. Routine eye exams. Ask your health care provider how often you should have your eyes checked. Personal lifestyle choices, including: Daily care of your teeth and gums. Regular physical activity. Eating a healthy diet. Avoiding tobacco and drug use. Limiting alcohol use. Practicing safe sex. Taking low-dose aspirin every day. Taking vitamin and mineral supplements as recommended by your health care provider. What happens during an annual well check? The services and screenings done by your health care provider during your annual well check will depend on your age, overall health, lifestyle risk factors, and family history of disease. Counseling  Your health care provider may ask you questions about your: Alcohol use.  Tobacco use. Drug use. Emotional well-being. Home and relationship well-being. Sexual activity. Eating habits. History of falls. Memory and ability to understand (cognition). Work and work Statistician. Reproductive health. Screening  You may have the following tests or measurements: Height, weight, and BMI. Blood pressure. Lipid and cholesterol levels. These may be checked every 5 years, or more frequently if you are over 61 years old. Skin check. Lung cancer screening. You may have this screening every year starting at age 47 if you have a 30-pack-year  history of smoking and currently smoke or have quit within the past 15 years. Fecal occult blood test (FOBT) of the stool. You may have this test every year starting at age 43. Flexible sigmoidoscopy or colonoscopy. You may have a sigmoidoscopy every 5 years or a colonoscopy every 10 years starting at age 14. Hepatitis C blood test. Hepatitis B blood test. Sexually transmitted disease (STD) testing. Diabetes screening. This is done by checking your blood sugar (glucose) after you have not eaten for a while (fasting). You may have this done every 1-3 years. Bone density scan. This is done to screen for osteoporosis. You may have this done starting at age 29. Mammogram. This may be done every 1-2 years. Talk to your health care provider about how often you should have regular mammograms. Talk with your health care provider about your test results, treatment options, and if necessary, the need for more tests. Vaccines  Your health care provider may recommend certain vaccines, such as: Influenza vaccine. This is recommended every year. Tetanus, diphtheria, and acellular pertussis (Tdap, Td) vaccine. You may need a Td booster every 10 years. Zoster vaccine. You may need this after age 35. Pneumococcal 13-valent conjugate (PCV13) vaccine. One dose is recommended after age 75. Pneumococcal polysaccharide (PPSV23) vaccine. One dose is recommended after age 20. Talk to your health care provider about which screenings and vaccines you need and how often you need them. This information is not intended to replace advice given to you by your health care provider. Make sure you discuss any questions you have with your health care provider. Document Released: 07/09/2015 Document Revised: 03/01/2016 Document Reviewed: 04/13/2015 Elsevier Interactive Patient Education  2017 [place] Prevention in the Home Falls can cause injuries. They can happen to people of all ages. There are many things you can  do to make your home safe and to help prevent falls. What can I do on the outside of my home? Regularly fix the edges of walkways and driveways and fix any cracks. Remove anything that might make you trip as you walk through a door, such as a raised step or threshold. Trim any bushes or trees on the path to your home. Use bright outdoor lighting. Clear any walking paths of anything that might make someone trip, such as rocks or tools. Regularly check to see if handrails are loose or broken. Make sure that both sides of any steps have handrails. Any raised decks and porches should have guardrails on the edges. Have any leaves, snow, or ice cleared regularly. Use sand or salt on walking paths during winter. Clean up any spills in your garage right away. This includes oil or grease spills. What can I do in the bathroom? Use night lights. Install grab bars by the toilet and in the tub and shower. Do not use towel bars as grab bars. Use non-skid mats or decals in the tub or shower. If you need to sit down in the shower, use  a plastic, non-slip stool. Keep the floor dry. Clean up any water that spills on the floor as soon as it happens. Remove soap buildup in the tub or shower regularly. Attach bath mats securely with double-sided non-slip rug tape. Do not have throw rugs and other things on the floor that can make you trip. What can I do in the bedroom? Use night lights. Make sure that you have a light by your bed that is easy to reach. Do not use any sheets or blankets that are too big for your bed. They should not hang down onto the floor. Have a firm chair that has side arms. You can use this for support while you get dressed. Do not have throw rugs and other things on the floor that can make you trip. What can I do in the kitchen? Clean up any spills right away. Avoid walking on wet floors. Keep items that you use a lot in easy-to-reach places. If you need to reach something above you,  use a strong step stool that has a grab bar. Keep electrical cords out of the way. Do not use floor polish or wax that makes floors slippery. If you must use wax, use non-skid floor wax. Do not have throw rugs and other things on the floor that can make you trip. What can I do with my stairs? Do not leave any items on the stairs. Make sure that there are handrails on both sides of the stairs and use them. Fix handrails that are broken or loose. Make sure that handrails are as long as the stairways. Check any carpeting to make sure that it is firmly attached to the stairs. Fix any carpet that is loose or worn. Avoid having throw rugs at the top or bottom of the stairs. If you do have throw rugs, attach them to the floor with carpet tape. Make sure that you have a light switch at the top of the stairs and the bottom of the stairs. If you do not have them, ask someone to add them for you. What else can I do to help prevent falls? Wear shoes that: Do not have high heels. Have rubber bottoms. Are comfortable and fit you well. Are closed at the toe. Do not wear sandals. If you use a stepladder: Make sure that it is fully opened. Do not climb a closed stepladder. Make sure that both sides of the stepladder are locked into place. Ask someone to hold it for you, if possible. Clearly mark and make sure that you can see: Any grab bars or handrails. First and last steps. Where the edge of each step is. Use tools that help you move around (mobility aids) if they are needed. These include: Canes. Walkers. Scooters. Crutches. Turn on the lights when you go into a dark area. Replace any light bulbs as soon as they burn out. Set up your furniture so you have a clear path. Avoid moving your furniture around. If any of your floors are uneven, fix them. If there are any pets around you, be aware of where they are. Review your medicines with your doctor. Some medicines can make you feel dizzy. This can  increase your chance of falling. Ask your doctor what other things that you can do to help prevent falls. This information is not intended to replace advice given to you by your health care provider. Make sure you discuss any questions you have with your health care provider. Document Released: 04/08/2009 Document Revised:  11/18/2015 Document Reviewed: 07/17/2014 Elsevier Interactive Patient Education  2017 [employer].

## 2022-07-20 NOTE — Progress Notes (Cosign Needed Addendum)
Virtual Visit via Telephone Note  I connected with  Mary Valencia on 07/20/22 at  9:30 AM EST by telephone and verified that I am speaking with the correct person using two identifiers.  Location: Patient: Home Provider: [place] Persons participating in the virtual visit: [place]   I discussed the limitations, risks, security and privacy concerns of performing an evaluation and management service by telephone and the availability of in person appointments. The patient expressed understanding and agreed to proceed.  Interactive audio and video telecommunications were attempted between this nurse and patient, however failed, due to patient having technical difficulties OR patient did not have access to video capability.  We continued and completed visit with audio only.  Some vital signs may be absent or patient reported.   Sheral Flow, LPN  Subjective:   Mary Valencia is a 67 y.o. female who presents for Medicare Annual (Subsequent) preventive examination.  Review of Systems     Cardiac Risk Factors include: advanced age (>60mn, >>49women);diabetes mellitus;dyslipidemia;hypertension;family history of premature cardiovascular disease     Objective:    Today's Vitals   07/20/22 0933  Weight: 130 lb (59 kg)  Height: '5\' 2"'$  (1.575 m)  PainSc: 0-No pain   Body mass index is 23.78 kg/m.     07/20/2022    9:36 AM 07/11/2021    9:56 AM 07/21/2020    1:02 PM 05/24/2020    2:30 PM 04/04/2019    1:16 PM 04/02/2018   11:53 AM 11/27/2016   10:54 AM  Advanced Directives  Does Patient Have a Medical Advance Directive? Yes Yes Yes Yes No No No  Type of AIndustrial/product designerof AFreescale SemiconductorPower of AFreescale SemiconductorPower of Attorney     Does patient want to make changes to medical advance directive?    No - Patient declined Yes (ED - Information included in AVS) Yes (ED - Information included in  AVS)   Copy of HCrosspointein Chart? No - copy requested No - copy requested No - copy requested      Would patient like information on creating a medical advance directive?       No - Patient declined    Current Medications (verified) Outpatient Encounter Medications as of 07/20/2022  Medication Sig   Alcohol Swabs (B-D SINGLE USE SWABS REGULAR) PADS Use as directed to clean site to check blood sugars   ASPIRIN LOW DOSE 81 MG tablet NEW PRESCRIPTION REQUEST: Aspirin 81 MG Tablet,delayed Release - TAKE ONE TABLET BY MOUTH DAILY   atorvastatin (LIPITOR) 40 MG tablet NEW PRESCRIPTION REQUEST: Atorvastatin 40 MG Tablet - TAKE ONE TABLET BY MOUTH DAILY   calcitRIOL (ROCALTROL) 0.5 MCG capsule Take by mouth. TAKE 1 BY MOUTH DAILY   Continuous Blood Gluc Sensor (FREESTYLE LIBRE 2 SENSOR) MISC Use as directed to check blood sugars daily   diphenhydrAMINE (BENADRYL) 25 mg capsule Take 25 mg by mouth as needed.   glucose blood test strip TEST BLOOD GLUCOSE THREE TIMES DAILY   GVOKE HYPOPEN 2-PACK 1 MG/0.2ML SOAJ INJECT 0.2 ML UNDER THE SKIN (SUBCUTANEOUSLY) DAILY AS NEEDED   hydrALAZINE (APRESOLINE) 50 MG tablet NEW PRESCRIPTION REQUEST: Hydralazine 50 MG Tablet - TAKE TWO TABLETS BY MOUTH TWICE DAILY   insulin NPH Human (NOVOLIN N) 100 UNIT/ML injection Inject into the skin. Inject 8 Units into the skin 2 times daily with meals   losartan (COZAAR) 100 MG tablet Take  1 tablet (100 mg total) by mouth 2 (two) times daily.   mupirocin cream (BACTROBAN) 2 % Apply 1 application topically daily.   NIFEdipine (ADALAT CC) 90 MG 24 hr tablet NEW PRESCRIPTION REQUEST: Nifedipine Er 90 MG Tablet,extended Release - TAKE ONE TABLET BY MOUTH DAILY   NIFEdipine (PROCARDIA XL/NIFEDICAL-XL) 90 MG 24 hr tablet    omeprazole (PRILOSEC) 40 MG capsule NEW PRESCRIPTION REQUEST: Omeprazole 40 MG Capsule,delayed Release - TAKE ONE CAPSULE BY MOUTH DAILY   polyethylene glycol (MIRALAX / GLYCOLAX) packet Take 17  g by mouth.   PRESCRIPTION MEDICATION 210 mg 3 (three) times daily before meals.   VELPHORO 500 MG chewable tablet CHEW 1 THREE TIMES A DAY WITH MEALS   No facility-administered encounter medications on file as of 07/20/2022.    Allergies (verified) Diltiazem, Iodinated contrast media, Betadine [povidone iodine], Clonidine derivatives, Gabapentin, Iodine, Penicillins, Sulfasalazine, Sulfonamide derivatives, Tetracyclines & related, Ceftriaxone, Ciprofloxacin, and Naproxen   History: Past Medical History:  Diagnosis Date   Allergic rhinitis    Chronic kidney disease    currently on dialysis   Edema 10/27/2012   L>R   GERD (gastroesophageal reflux disease)    HTN (hypertension)    Hyperlipidemia    Type II or unspecified type diabetes mellitus without mention of complication, not stated as uncontrolled    Past Surgical History:  Procedure Laterality Date   ABDOMINAL HYSTERECTOMY     partial   KIDNEY SURGERY     right removed   NEPHRECTOMY     right - related to untreated strep infection and stones   TONSILLECTOMY     Family History  Problem Relation Age of Onset   Heart disease Mother 62       MI   Heart disease Father 38       MI   Alcohol abuse Sister    Kidney disease Sister    Heart disease Brother        ?CHF ?CAD   Heart disease Maternal Aunt    Diabetes Other    Social History   Socioeconomic History   Marital status: Widowed    Spouse name: Not on file   Number of children: 5   Years of education: Not on file   Highest education level: Not on file  Occupational History   Occupation: disability  Tobacco Use   Smoking status: Never   Smokeless tobacco: Never  Vaping Use   Vaping Use: Never used  Substance and Sexual Activity   Alcohol use: No   Drug use: No   Sexual activity: Yes  Other Topics Concern   Not on file  Social History Narrative   Getting Masters in counseling, 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   Moved to [place] Determinants of  Health   Financial Resource Strain: Low Risk  (07/20/2022)   Overall Financial Resource Strain (CARDIA)    Difficulty of Paying Living Expenses: Not hard at all  Food Insecurity: No Food Insecurity (07/20/2022)   Hunger Vital Sign    Worried About Running Out of Food in the Last Year: Never true    [place] in the Last Year: Never true  Transportation Needs: No Transportation Needs (07/20/2022)   PRAPARE - Hydrologist (Medical): No    Lack of Transportation (Non-Medical): No  Physical Activity: Insufficiently Active (07/20/2022)   Exercise Vital Sign    Days of Exercise per Week: 3 days  Minutes of Exercise per Session: 20 min  Stress: No Stress Concern Present (07/20/2022)   [place]    Feeling of Stress : Not at all  Social Connections: Moderately Isolated (07/20/2022)   Social Connection and Isolation Panel [NHANES]    Frequency of Communication with Friends and Family: Twice a week    Frequency of Social Gatherings with Friends and Family: Twice a week    Attends Religious Services: 1 to 4 times per year    Active Member of [employer] or Organizations: No    Attends Archivist Meetings: Never    Marital Status: Widowed    Tobacco Counseling Counseling given: Not Answered   Clinical Intake:  Pre-visit preparation completed: Yes  Pain : No/denies pain Pain Score: 0-No pain     BMI - recorded: 23.78 Nutritional Risks: None Diabetes: No  How often do you need to have someone help you when you read instructions, pamphlets, or other written materials from your doctor or pharmacy?: 1 - Never What is the last grade level you completed in school?: Bachelor's Degree  Nutrition Risk Assessment:  Has the patient had any N/V/D within the last 2 months?  No  Does the patient have any non-healing wounds?  No  Has the patient had any unintentional weight loss or weight  gain?  No   Diabetes:  Is the patient diabetic?  Yes  If diabetic, was a CBG obtained today?  No  Did the patient bring in their glucometer from home?  No  How often do you monitor your CBG's? Freestyle [employer].   Financial Strains and Diabetes Management:  Are you having any financial strains with the device, your supplies or your medication? No .  Does the patient want to be seen by Chronic Care Management for management of their diabetes?  No  Would the patient like to be referred to a Nutritionist or for Diabetic Management?  No   Diabetic Exams:  Diabetic Eye Exam: Overdue for diabetic eye exam. Pt has been advised about the importance in completing this exam. Patient advised to call and schedule an eye exam. Diabetic Foot Exam: Overdue, Pt has been advised about the importance in completing this exam. Pt is scheduled for diabetic foot exam on 10/2022.   Interpreter Needed?: No  Information entered by :: Lisette Abu, LPN.   Activities of Daily Living    07/20/2022    9:43 AM  In your present state of health, do you have any difficulty performing the following activities:  Hearing? 0  Vision? 0  Difficulty concentrating or making decisions? 0  Walking or climbing stairs? 1  Dressing or bathing? 1  Doing errands, shopping? 1  Preparing Food and eating ? Y  Using the Toilet? Y  In the past six months, have you accidently leaked urine? Y  Do you have problems with loss of bowel control? Y  Managing your Medications? Y  Managing your Finances? Y  Housekeeping or managing your Housekeeping? Y    Patient Care Team: Plotnikov, Evie Lacks, MD as PCP - General Ninfa Linden Lind Guest, MD (Orthopedic Surgery) Boyd Kerbs, MD as Referring Physician (Neurology) Mezer, Nadara Mustard, MD (Obstetrics and Gynecology) Thereasa Distance, MD as Referring Physician (Nephrology) Curly Rim, MD as Referring Physician (Internal Medicine) Szabat, Darnelle Maffucci, [facility] (Inactive)  as Pharmacist (Pharmacist) Szabat, Darnelle Maffucci, [facility] (Inactive) as Pharmacist (Pharmacist)  Indicate any recent Medical Services you may have received from other than [facility]  providers in the past year (date may be approximate).     Assessment:   This is a routine wellness examination for Mary Valencia.  Hearing/Vision screen Hearing Screening - Comments:: Denies hearing difficulties   Vision Screening - Comments:: Wears rx glasses - up to date with routine eye exams with [facility]   Dietary issues and exercise activities discussed: Current Exercise Habits: Home exercise routine, Type of exercise: walking;Other - see comments (exercises with caregiver), Time (Minutes): 20, Frequency (Times/Week): 3, Weekly Exercise (Minutes/Week): 60, Intensity: Mild, Exercise limited by: cardiac condition(s)   Goals Addressed             This Visit's Progress    Patient Self-Care Activities       Timeframe:  Long-Range Goal Priority:  High Start Date:           10/25/21                  Expected End Date:       10/26/22                Patient Harshitha will: Take medications as prescribed Attend all scheduled provider appointments Call pharmacy for medication refills Call provider office for new concerns or questions Continue to check fasting (first thing in the morning, before eating) and then again 2-hours after eating blood sugars at home Monitor your blood sugars using your continuous glucose monitor anytime you feel that your blood sugars may be running lower or higher than they should Continue to follow heart healthy, low salt, low cholesterol, carbohydrate-modified, low sugar diet, and renal diet: continue maintaining contact with the nutritionist at your dialysis center Continue monitoring your blood pressures at home daily around your normal routine when you administer your peritoneal dialysis Continue your efforts to prevent falls- continue to use your wheelchair or walker      Depression  Screen    07/20/2022    9:47 AM 05/29/2022    2:11 PM 05/29/2022    2:10 PM 02/24/2022    1:48 PM 10/25/2021   10:30 AM 07/11/2021    9:59 AM 06/16/2021    2:51 PM  PHQ 2/9 Scores  PHQ - 2 Score 0 0 0  1 0 0  PHQ- 9 Score 4 4     0  Exception Documentation    Patient refusal       Fall Risk    07/20/2022    9:39 AM 05/29/2022    2:10 PM 02/24/2022    1:48 PM 10/25/2021   10:30 AM 07/11/2021    9:58 AM  Fall Risk   Falls in the past year? 1 1 0 1 0  Comment    reports last fall in October 2022; uses electric wheelchair and walker "all the time"   Number falls in past yr: '1 1  1 '$ 0  Injury with Fall? 0 0  0 0  Risk for fall due to : History of fall(s);Impaired balance/gait History of fall(s) Other (Comment) Medication side effect;History of fall(s);Impaired mobility   Risk for fall due to: Comment     cane Gilford Rile /wheelchair  Follow up Falls evaluation completed Falls evaluation completed Falls evaluation completed Falls prevention discussed;Education provided Falls evaluation completed    [place]:  Any stairs in or around the home? No  If so, are there any without handrails? No  Home free of loose throw rugs in walkways, pet beds, electrical cords, etc? Yes  Adequate lighting  in your home to reduce risk of falls? Yes   ASSISTIVE DEVICES UTILIZED TO PREVENT FALLS:  Life alert? No  Use of a cane, walker or w/c? Yes  Grab bars in the bathroom? Yes  Shower chair or bench in shower? Yes  Elevated toilet seat or a handicapped toilet? Yes   TIMED UP AND GO: Phone Visit  Was the test performed? No .   Cognitive Function:        07/20/2022    9:40 AM 05/24/2020    2:33 PM  6CIT Screen  What Year? 0 points 0 points  What month? 0 points 0 points  What time? 0 points 0 points  Count back from 20 0 points 0 points  Months in reverse 0 points 0 points  Repeat phrase 0 points 0 points  Total Score 0 points 0 points     Immunizations Immunization History  Administered Date(s) Administered   Fluad Quad(high Dose 65+) 04/26/2020   Influenza Whole 05/16/2005   Influenza, High Dose Seasonal PF 03/26/2021   Influenza-Unspecified 03/26/2018   Pneumococcal Polysaccharide-23 10/08/2016   Td 09/26/2004, 08/25/2011, 02/24/2017    TDAP status: Up to date  Flu Vaccine status: Due, Education has been provided regarding the importance of this vaccine. Advised may receive this vaccine at local pharmacy or Health Dept. Aware to provide a copy of the vaccination record if obtained from local pharmacy or Health Dept. Verbalized acceptance and understanding.  Pneumococcal vaccine status: Due, Education has been provided regarding the importance of this vaccine. Advised may receive this vaccine at local pharmacy or Health Dept. Aware to provide a copy of the vaccination record if obtained from local pharmacy or Health Dept. Verbalized acceptance and understanding.  Covid-19 vaccine status: Declined, Education has been provided regarding the importance of this vaccine but patient still declined. Advised may receive this vaccine at local pharmacy or Health Dept.or vaccine clinic. Aware to provide a copy of the vaccination record if obtained from local pharmacy or Health Dept. Verbalized acceptance and understanding.  Qualifies for Shingles Vaccine? Yes   Zostavax completed No   Shingrix Completed?: No.    Education has been provided regarding the importance of this vaccine. Patient has been advised to call insurance company to determine out of pocket expense if they have not yet received this vaccine. Advised may also receive vaccine at local pharmacy or Health Dept. Verbalized acceptance and understanding.  Screening Tests Health Maintenance  Topic Date Due   COVID-19 Vaccine (1) Never done   OPHTHALMOLOGY EXAM  Never done   Zoster Vaccines- Shingrix (1 of 2) Never done   FOOT EXAM  04/03/2019   HEMOGLOBIN A1C   12/15/2021   INFLUENZA VACCINE  01/24/2022   Pneumonia Vaccine 39+ Years old (2 - PCV) 02/25/2023 (Originally 10/08/2017)   MAMMOGRAM  02/25/2023 (Originally 10/09/2017)   Medicare Annual Wellness (AWV)  07/21/2023   COLONOSCOPY (Pts 45-95yr Insurance coverage will need to be confirmed)  12/16/2026   DTaP/Tdap/Td (4 - Tdap) 02/25/2027   DEXA SCAN  Completed   Hepatitis C Screening  Completed   HPV VACCINES  Aged Out    Health Maintenance  Health Maintenance Due  Topic Date Due   COVID-19 Vaccine (1) Never done   OPHTHALMOLOGY EXAM  Never done   Zoster Vaccines- Shingrix (1 of 2) Never done   FOOT EXAM  04/03/2019   HEMOGLOBIN A1C  12/15/2021   INFLUENZA VACCINE  01/24/2022    Colorectal cancer screening: Type of screening: Colonoscopy.  Completed 12/15/2016. Repeat every 10 years  Mammogram status: Completed 10/09/2016. Repeat every year Postponed per pcp.  Bone Density status: Completed 12/28/2021. Results reflect: Bone density results: OSTEOPOROSIS. Repeat every 2 years.  Lung Cancer Screening: (Low Dose CT Chest recommended if Age 45-80 years, 30 pack-year currently smoking OR have quit w/in 15years.) does not qualify.   Lung Cancer Screening Referral: no  Additional Screening:  Hepatitis C Screening: does qualify; Completed 09/21/2017  Vision Screening: Recommended annual ophthalmology exams for early detection of glaucoma and other disorders of the eye. Is the patient up to date with their annual eye exam?  Yes  Who is the provider or what is the name of the office in which the patient attends annual eye exams? [facility] If pt is not established with a provider, would they like to be referred to a provider to establish care? No .   Dental Screening: Recommended annual dental exams for proper oral hygiene  Community Resource Referral / Chronic Care Management: CRR required this visit?  No   CCM required this visit?  No      Plan:     I have personally  reviewed and noted the following in the patient's chart:   Medical and social history Use of alcohol, tobacco or illicit drugs  Current medications and supplements including opioid prescriptions. Patient is not currently taking opioid prescriptions. Functional ability and status Nutritional status Physical activity Advanced directives List of other physicians Hospitalizations, surgeries, and ER visits in previous 12 months Vitals Screenings to include cognitive, depression, and falls Referrals and appointments  In addition, I have reviewed and discussed with patient certain preventive protocols, quality metrics, and best practice recommendations. A written personalized care plan for preventive services as well as general preventive health recommendations were provided to patient.     Sheral Flow, LPN   3/82/5053   Nurse Notes: n/a   Medical screening examination/treatment/procedure(s) were performed by non-physician practitioner and as supervising physician I was immediately available for consultation/collaboration.  I agree with above. Lew Dawes, MD

## 2022-07-21 DIAGNOSIS — N2581 Secondary hyperparathyroidism of renal origin: Secondary | ICD-10-CM | POA: Diagnosis not present

## 2022-07-21 DIAGNOSIS — D631 Anemia in chronic kidney disease: Secondary | ICD-10-CM | POA: Diagnosis not present

## 2022-07-21 DIAGNOSIS — N186 End stage renal disease: Secondary | ICD-10-CM | POA: Diagnosis not present

## 2022-07-22 DIAGNOSIS — N186 End stage renal disease: Secondary | ICD-10-CM | POA: Diagnosis not present

## 2022-07-22 DIAGNOSIS — N2581 Secondary hyperparathyroidism of renal origin: Secondary | ICD-10-CM | POA: Diagnosis not present

## 2022-07-22 DIAGNOSIS — D631 Anemia in chronic kidney disease: Secondary | ICD-10-CM | POA: Diagnosis not present

## 2022-07-23 DIAGNOSIS — N186 End stage renal disease: Secondary | ICD-10-CM | POA: Diagnosis not present

## 2022-07-23 DIAGNOSIS — N2581 Secondary hyperparathyroidism of renal origin: Secondary | ICD-10-CM | POA: Diagnosis not present

## 2022-07-23 DIAGNOSIS — D631 Anemia in chronic kidney disease: Secondary | ICD-10-CM | POA: Diagnosis not present

## 2022-07-24 DIAGNOSIS — D631 Anemia in chronic kidney disease: Secondary | ICD-10-CM | POA: Diagnosis not present

## 2022-07-24 DIAGNOSIS — N2581 Secondary hyperparathyroidism of renal origin: Secondary | ICD-10-CM | POA: Diagnosis not present

## 2022-07-24 DIAGNOSIS — N186 End stage renal disease: Secondary | ICD-10-CM | POA: Diagnosis not present

## 2022-07-25 DIAGNOSIS — D631 Anemia in chronic kidney disease: Secondary | ICD-10-CM | POA: Diagnosis not present

## 2022-07-25 DIAGNOSIS — N186 End stage renal disease: Secondary | ICD-10-CM | POA: Diagnosis not present

## 2022-07-25 DIAGNOSIS — N2581 Secondary hyperparathyroidism of renal origin: Secondary | ICD-10-CM | POA: Diagnosis not present

## 2022-07-26 DIAGNOSIS — N186 End stage renal disease: Secondary | ICD-10-CM | POA: Diagnosis not present

## 2022-07-26 DIAGNOSIS — D631 Anemia in chronic kidney disease: Secondary | ICD-10-CM | POA: Diagnosis not present

## 2022-07-26 DIAGNOSIS — N2581 Secondary hyperparathyroidism of renal origin: Secondary | ICD-10-CM | POA: Diagnosis not present

## 2022-07-27 DIAGNOSIS — N186 End stage renal disease: Secondary | ICD-10-CM | POA: Diagnosis not present

## 2022-07-27 DIAGNOSIS — D631 Anemia in chronic kidney disease: Secondary | ICD-10-CM | POA: Diagnosis not present

## 2022-07-27 DIAGNOSIS — E119 Type 2 diabetes mellitus without complications: Secondary | ICD-10-CM | POA: Diagnosis not present

## 2022-07-27 DIAGNOSIS — R6889 Other general symptoms and signs: Secondary | ICD-10-CM | POA: Diagnosis not present

## 2022-07-27 DIAGNOSIS — N2581 Secondary hyperparathyroidism of renal origin: Secondary | ICD-10-CM | POA: Diagnosis not present

## 2022-07-28 DIAGNOSIS — N2581 Secondary hyperparathyroidism of renal origin: Secondary | ICD-10-CM | POA: Diagnosis not present

## 2022-07-28 DIAGNOSIS — N186 End stage renal disease: Secondary | ICD-10-CM | POA: Diagnosis not present

## 2022-07-28 DIAGNOSIS — D631 Anemia in chronic kidney disease: Secondary | ICD-10-CM | POA: Diagnosis not present

## 2022-07-29 DIAGNOSIS — D631 Anemia in chronic kidney disease: Secondary | ICD-10-CM | POA: Diagnosis not present

## 2022-07-29 DIAGNOSIS — N2581 Secondary hyperparathyroidism of renal origin: Secondary | ICD-10-CM | POA: Diagnosis not present

## 2022-07-29 DIAGNOSIS — N186 End stage renal disease: Secondary | ICD-10-CM | POA: Diagnosis not present

## 2022-07-30 DIAGNOSIS — N186 End stage renal disease: Secondary | ICD-10-CM | POA: Diagnosis not present

## 2022-07-30 DIAGNOSIS — D631 Anemia in chronic kidney disease: Secondary | ICD-10-CM | POA: Diagnosis not present

## 2022-07-30 DIAGNOSIS — N2581 Secondary hyperparathyroidism of renal origin: Secondary | ICD-10-CM | POA: Diagnosis not present

## 2022-07-31 DIAGNOSIS — D631 Anemia in chronic kidney disease: Secondary | ICD-10-CM | POA: Diagnosis not present

## 2022-07-31 DIAGNOSIS — N186 End stage renal disease: Secondary | ICD-10-CM | POA: Diagnosis not present

## 2022-07-31 DIAGNOSIS — N2581 Secondary hyperparathyroidism of renal origin: Secondary | ICD-10-CM | POA: Diagnosis not present

## 2022-08-01 DIAGNOSIS — D631 Anemia in chronic kidney disease: Secondary | ICD-10-CM | POA: Diagnosis not present

## 2022-08-01 DIAGNOSIS — N186 End stage renal disease: Secondary | ICD-10-CM | POA: Diagnosis not present

## 2022-08-01 DIAGNOSIS — N2581 Secondary hyperparathyroidism of renal origin: Secondary | ICD-10-CM | POA: Diagnosis not present

## 2022-08-02 DIAGNOSIS — D631 Anemia in chronic kidney disease: Secondary | ICD-10-CM | POA: Diagnosis not present

## 2022-08-02 DIAGNOSIS — N186 End stage renal disease: Secondary | ICD-10-CM | POA: Diagnosis not present

## 2022-08-02 DIAGNOSIS — R6889 Other general symptoms and signs: Secondary | ICD-10-CM | POA: Diagnosis not present

## 2022-08-02 DIAGNOSIS — N2581 Secondary hyperparathyroidism of renal origin: Secondary | ICD-10-CM | POA: Diagnosis not present

## 2022-08-03 DIAGNOSIS — N186 End stage renal disease: Secondary | ICD-10-CM | POA: Diagnosis not present

## 2022-08-03 DIAGNOSIS — N2581 Secondary hyperparathyroidism of renal origin: Secondary | ICD-10-CM | POA: Diagnosis not present

## 2022-08-03 DIAGNOSIS — D631 Anemia in chronic kidney disease: Secondary | ICD-10-CM | POA: Diagnosis not present

## 2022-08-04 DIAGNOSIS — N2581 Secondary hyperparathyroidism of renal origin: Secondary | ICD-10-CM | POA: Diagnosis not present

## 2022-08-04 DIAGNOSIS — D631 Anemia in chronic kidney disease: Secondary | ICD-10-CM | POA: Diagnosis not present

## 2022-08-04 DIAGNOSIS — N186 End stage renal disease: Secondary | ICD-10-CM | POA: Diagnosis not present

## 2022-08-05 DIAGNOSIS — D631 Anemia in chronic kidney disease: Secondary | ICD-10-CM | POA: Diagnosis not present

## 2022-08-05 DIAGNOSIS — N2581 Secondary hyperparathyroidism of renal origin: Secondary | ICD-10-CM | POA: Diagnosis not present

## 2022-08-05 DIAGNOSIS — N186 End stage renal disease: Secondary | ICD-10-CM | POA: Diagnosis not present

## 2022-08-06 DIAGNOSIS — D631 Anemia in chronic kidney disease: Secondary | ICD-10-CM | POA: Diagnosis not present

## 2022-08-06 DIAGNOSIS — N2581 Secondary hyperparathyroidism of renal origin: Secondary | ICD-10-CM | POA: Diagnosis not present

## 2022-08-06 DIAGNOSIS — N186 End stage renal disease: Secondary | ICD-10-CM | POA: Diagnosis not present

## 2022-08-07 DIAGNOSIS — N2581 Secondary hyperparathyroidism of renal origin: Secondary | ICD-10-CM | POA: Diagnosis not present

## 2022-08-07 DIAGNOSIS — D631 Anemia in chronic kidney disease: Secondary | ICD-10-CM | POA: Diagnosis not present

## 2022-08-07 DIAGNOSIS — N186 End stage renal disease: Secondary | ICD-10-CM | POA: Diagnosis not present

## 2022-08-08 DIAGNOSIS — D631 Anemia in chronic kidney disease: Secondary | ICD-10-CM | POA: Diagnosis not present

## 2022-08-08 DIAGNOSIS — N186 End stage renal disease: Secondary | ICD-10-CM | POA: Diagnosis not present

## 2022-08-08 DIAGNOSIS — N2581 Secondary hyperparathyroidism of renal origin: Secondary | ICD-10-CM | POA: Diagnosis not present

## 2022-08-09 DIAGNOSIS — D631 Anemia in chronic kidney disease: Secondary | ICD-10-CM | POA: Diagnosis not present

## 2022-08-09 DIAGNOSIS — N186 End stage renal disease: Secondary | ICD-10-CM | POA: Diagnosis not present

## 2022-08-09 DIAGNOSIS — N2581 Secondary hyperparathyroidism of renal origin: Secondary | ICD-10-CM | POA: Diagnosis not present

## 2022-08-10 ENCOUNTER — Telehealth: Payer: Self-pay | Admitting: Internal Medicine

## 2022-08-10 DIAGNOSIS — D631 Anemia in chronic kidney disease: Secondary | ICD-10-CM | POA: Diagnosis not present

## 2022-08-10 DIAGNOSIS — N186 End stage renal disease: Secondary | ICD-10-CM | POA: Diagnosis not present

## 2022-08-10 DIAGNOSIS — N2581 Secondary hyperparathyroidism of renal origin: Secondary | ICD-10-CM | POA: Diagnosis not present

## 2022-08-10 NOTE — Telephone Encounter (Signed)
Patient called and asked for her Elenor Legato 2 to be sent to [facility]. She has a new insurance and needs it to be filled there. Best callback number for patient is [phone].

## 2022-08-10 NOTE — Telephone Encounter (Signed)
Okay.  Please send Libre 2 prescription to [facility].  Thank you

## 2022-08-11 DIAGNOSIS — D631 Anemia in chronic kidney disease: Secondary | ICD-10-CM | POA: Diagnosis not present

## 2022-08-11 DIAGNOSIS — N186 End stage renal disease: Secondary | ICD-10-CM | POA: Diagnosis not present

## 2022-08-11 DIAGNOSIS — N2581 Secondary hyperparathyroidism of renal origin: Secondary | ICD-10-CM | POA: Diagnosis not present

## 2022-08-12 DIAGNOSIS — N2581 Secondary hyperparathyroidism of renal origin: Secondary | ICD-10-CM | POA: Diagnosis not present

## 2022-08-12 DIAGNOSIS — D631 Anemia in chronic kidney disease: Secondary | ICD-10-CM | POA: Diagnosis not present

## 2022-08-12 DIAGNOSIS — N186 End stage renal disease: Secondary | ICD-10-CM | POA: Diagnosis not present

## 2022-08-13 DIAGNOSIS — N186 End stage renal disease: Secondary | ICD-10-CM | POA: Diagnosis not present

## 2022-08-13 DIAGNOSIS — D631 Anemia in chronic kidney disease: Secondary | ICD-10-CM | POA: Diagnosis not present

## 2022-08-13 DIAGNOSIS — N2581 Secondary hyperparathyroidism of renal origin: Secondary | ICD-10-CM | POA: Diagnosis not present

## 2022-08-14 ENCOUNTER — Telehealth: Payer: Self-pay | Admitting: Internal Medicine

## 2022-08-14 DIAGNOSIS — N2581 Secondary hyperparathyroidism of renal origin: Secondary | ICD-10-CM | POA: Diagnosis not present

## 2022-08-14 DIAGNOSIS — D631 Anemia in chronic kidney disease: Secondary | ICD-10-CM | POA: Diagnosis not present

## 2022-08-14 DIAGNOSIS — N186 End stage renal disease: Secondary | ICD-10-CM | POA: Diagnosis not present

## 2022-08-14 NOTE — Telephone Encounter (Signed)
Patient would like to order the free style [place] - Fax:  [phone]

## 2022-08-15 DIAGNOSIS — N2581 Secondary hyperparathyroidism of renal origin: Secondary | ICD-10-CM | POA: Diagnosis not present

## 2022-08-15 DIAGNOSIS — D631 Anemia in chronic kidney disease: Secondary | ICD-10-CM | POA: Diagnosis not present

## 2022-08-15 DIAGNOSIS — N186 End stage renal disease: Secondary | ICD-10-CM | POA: Diagnosis not present

## 2022-08-15 MED ORDER — FREESTYLE LIBRE 2 SENSOR MISC: 1.0000 [IU] | 3 refills | Status: DC

## 2022-08-15 MED ORDER — FREESTYLE LIBRE 2 READER DEVI: 0 refills | Status: DC

## 2022-08-15 NOTE — Telephone Encounter (Signed)
Okay.  Thanks.

## 2022-08-15 NOTE — Telephone Encounter (Signed)
Faxed free style libre 2 to [facility] - Fax:  [phone].Marland KitchenJohny Chess

## 2022-08-16 DIAGNOSIS — N2581 Secondary hyperparathyroidism of renal origin: Secondary | ICD-10-CM | POA: Diagnosis not present

## 2022-08-16 DIAGNOSIS — N186 End stage renal disease: Secondary | ICD-10-CM | POA: Diagnosis not present

## 2022-08-16 DIAGNOSIS — D631 Anemia in chronic kidney disease: Secondary | ICD-10-CM | POA: Diagnosis not present

## 2022-08-16 NOTE — Telephone Encounter (Signed)
Duplicate msg.. Elenor Legato was faxed yesterday to [facility]. See previous msg//lmb

## 2022-08-17 DIAGNOSIS — D631 Anemia in chronic kidney disease: Secondary | ICD-10-CM | POA: Diagnosis not present

## 2022-08-17 DIAGNOSIS — N186 End stage renal disease: Secondary | ICD-10-CM | POA: Diagnosis not present

## 2022-08-17 DIAGNOSIS — N2581 Secondary hyperparathyroidism of renal origin: Secondary | ICD-10-CM | POA: Diagnosis not present

## 2022-08-18 DIAGNOSIS — N2581 Secondary hyperparathyroidism of renal origin: Secondary | ICD-10-CM | POA: Diagnosis not present

## 2022-08-18 DIAGNOSIS — N186 End stage renal disease: Secondary | ICD-10-CM | POA: Diagnosis not present

## 2022-08-18 DIAGNOSIS — D631 Anemia in chronic kidney disease: Secondary | ICD-10-CM | POA: Diagnosis not present

## 2022-08-19 DIAGNOSIS — N2581 Secondary hyperparathyroidism of renal origin: Secondary | ICD-10-CM | POA: Diagnosis not present

## 2022-08-19 DIAGNOSIS — N186 End stage renal disease: Secondary | ICD-10-CM | POA: Diagnosis not present

## 2022-08-19 DIAGNOSIS — D631 Anemia in chronic kidney disease: Secondary | ICD-10-CM | POA: Diagnosis not present

## 2022-08-20 DIAGNOSIS — N2581 Secondary hyperparathyroidism of renal origin: Secondary | ICD-10-CM | POA: Diagnosis not present

## 2022-08-20 DIAGNOSIS — N186 End stage renal disease: Secondary | ICD-10-CM | POA: Diagnosis not present

## 2022-08-20 DIAGNOSIS — D631 Anemia in chronic kidney disease: Secondary | ICD-10-CM | POA: Diagnosis not present

## 2022-08-21 DIAGNOSIS — D631 Anemia in chronic kidney disease: Secondary | ICD-10-CM | POA: Diagnosis not present

## 2022-08-21 DIAGNOSIS — N186 End stage renal disease: Secondary | ICD-10-CM | POA: Diagnosis not present

## 2022-08-21 DIAGNOSIS — N2581 Secondary hyperparathyroidism of renal origin: Secondary | ICD-10-CM | POA: Diagnosis not present

## 2022-08-22 DIAGNOSIS — N2581 Secondary hyperparathyroidism of renal origin: Secondary | ICD-10-CM | POA: Diagnosis not present

## 2022-08-22 DIAGNOSIS — N186 End stage renal disease: Secondary | ICD-10-CM | POA: Diagnosis not present

## 2022-08-22 DIAGNOSIS — D631 Anemia in chronic kidney disease: Secondary | ICD-10-CM | POA: Diagnosis not present

## 2022-08-23 DIAGNOSIS — N186 End stage renal disease: Secondary | ICD-10-CM | POA: Diagnosis not present

## 2022-08-23 DIAGNOSIS — D631 Anemia in chronic kidney disease: Secondary | ICD-10-CM | POA: Diagnosis not present

## 2022-08-23 DIAGNOSIS — N2581 Secondary hyperparathyroidism of renal origin: Secondary | ICD-10-CM | POA: Diagnosis not present

## 2022-08-24 DIAGNOSIS — N186 End stage renal disease: Secondary | ICD-10-CM | POA: Diagnosis not present

## 2022-08-24 DIAGNOSIS — N2581 Secondary hyperparathyroidism of renal origin: Secondary | ICD-10-CM | POA: Diagnosis not present

## 2022-08-24 DIAGNOSIS — D631 Anemia in chronic kidney disease: Secondary | ICD-10-CM | POA: Diagnosis not present

## 2022-08-25 DIAGNOSIS — N186 End stage renal disease: Secondary | ICD-10-CM | POA: Diagnosis not present

## 2022-08-25 DIAGNOSIS — N2581 Secondary hyperparathyroidism of renal origin: Secondary | ICD-10-CM | POA: Diagnosis not present

## 2022-08-25 DIAGNOSIS — D631 Anemia in chronic kidney disease: Secondary | ICD-10-CM | POA: Diagnosis not present

## 2022-08-26 DIAGNOSIS — N186 End stage renal disease: Secondary | ICD-10-CM | POA: Diagnosis not present

## 2022-08-26 DIAGNOSIS — D631 Anemia in chronic kidney disease: Secondary | ICD-10-CM | POA: Diagnosis not present

## 2022-08-26 DIAGNOSIS — N2581 Secondary hyperparathyroidism of renal origin: Secondary | ICD-10-CM | POA: Diagnosis not present

## 2022-08-27 DIAGNOSIS — N186 End stage renal disease: Secondary | ICD-10-CM | POA: Diagnosis not present

## 2022-08-27 DIAGNOSIS — N2581 Secondary hyperparathyroidism of renal origin: Secondary | ICD-10-CM | POA: Diagnosis not present

## 2022-08-27 DIAGNOSIS — D631 Anemia in chronic kidney disease: Secondary | ICD-10-CM | POA: Diagnosis not present

## 2022-08-28 DIAGNOSIS — N2581 Secondary hyperparathyroidism of renal origin: Secondary | ICD-10-CM | POA: Diagnosis not present

## 2022-08-28 DIAGNOSIS — N186 End stage renal disease: Secondary | ICD-10-CM | POA: Diagnosis not present

## 2022-08-28 DIAGNOSIS — D631 Anemia in chronic kidney disease: Secondary | ICD-10-CM | POA: Diagnosis not present

## 2022-08-29 DIAGNOSIS — D631 Anemia in chronic kidney disease: Secondary | ICD-10-CM | POA: Diagnosis not present

## 2022-08-29 DIAGNOSIS — N186 End stage renal disease: Secondary | ICD-10-CM | POA: Diagnosis not present

## 2022-08-29 DIAGNOSIS — N2581 Secondary hyperparathyroidism of renal origin: Secondary | ICD-10-CM | POA: Diagnosis not present

## 2022-08-30 DIAGNOSIS — N186 End stage renal disease: Secondary | ICD-10-CM | POA: Diagnosis not present

## 2022-08-30 DIAGNOSIS — D631 Anemia in chronic kidney disease: Secondary | ICD-10-CM | POA: Diagnosis not present

## 2022-08-30 DIAGNOSIS — N2581 Secondary hyperparathyroidism of renal origin: Secondary | ICD-10-CM | POA: Diagnosis not present

## 2022-08-31 DIAGNOSIS — Z1159 Encounter for screening for other viral diseases: Secondary | ICD-10-CM | POA: Diagnosis not present

## 2022-08-31 DIAGNOSIS — N186 End stage renal disease: Secondary | ICD-10-CM | POA: Diagnosis not present

## 2022-08-31 DIAGNOSIS — D631 Anemia in chronic kidney disease: Secondary | ICD-10-CM | POA: Diagnosis not present

## 2022-08-31 DIAGNOSIS — N2581 Secondary hyperparathyroidism of renal origin: Secondary | ICD-10-CM | POA: Diagnosis not present

## 2022-08-31 DIAGNOSIS — E119 Type 2 diabetes mellitus without complications: Secondary | ICD-10-CM | POA: Diagnosis not present

## 2022-08-31 DIAGNOSIS — R6889 Other general symptoms and signs: Secondary | ICD-10-CM | POA: Diagnosis not present

## 2022-09-01 ENCOUNTER — Telehealth: Payer: Self-pay | Admitting: Internal Medicine

## 2022-09-01 DIAGNOSIS — N2581 Secondary hyperparathyroidism of renal origin: Secondary | ICD-10-CM | POA: Diagnosis not present

## 2022-09-01 DIAGNOSIS — D631 Anemia in chronic kidney disease: Secondary | ICD-10-CM | POA: Diagnosis not present

## 2022-09-01 DIAGNOSIS — N186 End stage renal disease: Secondary | ICD-10-CM | POA: Diagnosis not present

## 2022-09-01 NOTE — Telephone Encounter (Signed)
Patient said her  Continuous Blood Gluc Sensor (FREESTYLE LIBRE 2 SENSOR) MISC cannot be filled until office notes are sent to [facility]. They will not fill the prescription until those are received. Patient would like a callback at [phone].

## 2022-09-01 NOTE — Telephone Encounter (Signed)
Called pt clarified msg.. Sent last two OV notes to [facility].Marland KitchenJohny Chess

## 2022-09-02 DIAGNOSIS — N2581 Secondary hyperparathyroidism of renal origin: Secondary | ICD-10-CM | POA: Diagnosis not present

## 2022-09-02 DIAGNOSIS — N186 End stage renal disease: Secondary | ICD-10-CM | POA: Diagnosis not present

## 2022-09-02 DIAGNOSIS — D631 Anemia in chronic kidney disease: Secondary | ICD-10-CM | POA: Diagnosis not present

## 2022-09-03 DIAGNOSIS — N186 End stage renal disease: Secondary | ICD-10-CM | POA: Diagnosis not present

## 2022-09-03 DIAGNOSIS — D631 Anemia in chronic kidney disease: Secondary | ICD-10-CM | POA: Diagnosis not present

## 2022-09-03 DIAGNOSIS — N2581 Secondary hyperparathyroidism of renal origin: Secondary | ICD-10-CM | POA: Diagnosis not present

## 2022-09-04 DIAGNOSIS — D631 Anemia in chronic kidney disease: Secondary | ICD-10-CM | POA: Diagnosis not present

## 2022-09-04 DIAGNOSIS — N2581 Secondary hyperparathyroidism of renal origin: Secondary | ICD-10-CM | POA: Diagnosis not present

## 2022-09-04 DIAGNOSIS — N186 End stage renal disease: Secondary | ICD-10-CM | POA: Diagnosis not present

## 2022-09-05 DIAGNOSIS — N2581 Secondary hyperparathyroidism of renal origin: Secondary | ICD-10-CM | POA: Diagnosis not present

## 2022-09-05 DIAGNOSIS — N186 End stage renal disease: Secondary | ICD-10-CM | POA: Diagnosis not present

## 2022-09-05 DIAGNOSIS — D631 Anemia in chronic kidney disease: Secondary | ICD-10-CM | POA: Diagnosis not present

## 2022-09-06 DIAGNOSIS — D631 Anemia in chronic kidney disease: Secondary | ICD-10-CM | POA: Diagnosis not present

## 2022-09-06 DIAGNOSIS — N186 End stage renal disease: Secondary | ICD-10-CM | POA: Diagnosis not present

## 2022-09-06 DIAGNOSIS — N2581 Secondary hyperparathyroidism of renal origin: Secondary | ICD-10-CM | POA: Diagnosis not present

## 2022-09-07 DIAGNOSIS — N186 End stage renal disease: Secondary | ICD-10-CM | POA: Diagnosis not present

## 2022-09-07 DIAGNOSIS — D631 Anemia in chronic kidney disease: Secondary | ICD-10-CM | POA: Diagnosis not present

## 2022-09-07 DIAGNOSIS — N2581 Secondary hyperparathyroidism of renal origin: Secondary | ICD-10-CM | POA: Diagnosis not present

## 2022-09-08 DIAGNOSIS — N186 End stage renal disease: Secondary | ICD-10-CM | POA: Diagnosis not present

## 2022-09-08 DIAGNOSIS — D631 Anemia in chronic kidney disease: Secondary | ICD-10-CM | POA: Diagnosis not present

## 2022-09-08 DIAGNOSIS — N2581 Secondary hyperparathyroidism of renal origin: Secondary | ICD-10-CM | POA: Diagnosis not present

## 2022-09-09 DIAGNOSIS — N186 End stage renal disease: Secondary | ICD-10-CM | POA: Diagnosis not present

## 2022-09-09 DIAGNOSIS — D631 Anemia in chronic kidney disease: Secondary | ICD-10-CM | POA: Diagnosis not present

## 2022-09-09 DIAGNOSIS — N2581 Secondary hyperparathyroidism of renal origin: Secondary | ICD-10-CM | POA: Diagnosis not present

## 2022-09-10 DIAGNOSIS — D631 Anemia in chronic kidney disease: Secondary | ICD-10-CM | POA: Diagnosis not present

## 2022-09-10 DIAGNOSIS — N186 End stage renal disease: Secondary | ICD-10-CM | POA: Diagnosis not present

## 2022-09-10 DIAGNOSIS — N2581 Secondary hyperparathyroidism of renal origin: Secondary | ICD-10-CM | POA: Diagnosis not present

## 2022-09-11 DIAGNOSIS — N2581 Secondary hyperparathyroidism of renal origin: Secondary | ICD-10-CM | POA: Diagnosis not present

## 2022-09-11 DIAGNOSIS — N186 End stage renal disease: Secondary | ICD-10-CM | POA: Diagnosis not present

## 2022-09-11 DIAGNOSIS — D631 Anemia in chronic kidney disease: Secondary | ICD-10-CM | POA: Diagnosis not present

## 2022-09-12 DIAGNOSIS — N186 End stage renal disease: Secondary | ICD-10-CM | POA: Diagnosis not present

## 2022-09-12 DIAGNOSIS — D631 Anemia in chronic kidney disease: Secondary | ICD-10-CM | POA: Diagnosis not present

## 2022-09-12 DIAGNOSIS — N2581 Secondary hyperparathyroidism of renal origin: Secondary | ICD-10-CM | POA: Diagnosis not present

## 2022-09-13 DIAGNOSIS — R6889 Other general symptoms and signs: Secondary | ICD-10-CM | POA: Diagnosis not present

## 2022-09-13 DIAGNOSIS — D631 Anemia in chronic kidney disease: Secondary | ICD-10-CM | POA: Diagnosis not present

## 2022-09-13 DIAGNOSIS — N2581 Secondary hyperparathyroidism of renal origin: Secondary | ICD-10-CM | POA: Diagnosis not present

## 2022-09-13 DIAGNOSIS — N186 End stage renal disease: Secondary | ICD-10-CM | POA: Diagnosis not present

## 2022-09-14 DIAGNOSIS — D631 Anemia in chronic kidney disease: Secondary | ICD-10-CM | POA: Diagnosis not present

## 2022-09-14 DIAGNOSIS — N2581 Secondary hyperparathyroidism of renal origin: Secondary | ICD-10-CM | POA: Diagnosis not present

## 2022-09-14 DIAGNOSIS — N186 End stage renal disease: Secondary | ICD-10-CM | POA: Diagnosis not present

## 2022-09-15 DIAGNOSIS — N2581 Secondary hyperparathyroidism of renal origin: Secondary | ICD-10-CM | POA: Diagnosis not present

## 2022-09-15 DIAGNOSIS — D631 Anemia in chronic kidney disease: Secondary | ICD-10-CM | POA: Diagnosis not present

## 2022-09-15 DIAGNOSIS — N186 End stage renal disease: Secondary | ICD-10-CM | POA: Diagnosis not present

## 2022-09-16 DIAGNOSIS — D631 Anemia in chronic kidney disease: Secondary | ICD-10-CM | POA: Diagnosis not present

## 2022-09-16 DIAGNOSIS — N2581 Secondary hyperparathyroidism of renal origin: Secondary | ICD-10-CM | POA: Diagnosis not present

## 2022-09-16 DIAGNOSIS — N186 End stage renal disease: Secondary | ICD-10-CM | POA: Diagnosis not present

## 2022-09-17 DIAGNOSIS — N186 End stage renal disease: Secondary | ICD-10-CM | POA: Diagnosis not present

## 2022-09-17 DIAGNOSIS — N2581 Secondary hyperparathyroidism of renal origin: Secondary | ICD-10-CM | POA: Diagnosis not present

## 2022-09-17 DIAGNOSIS — D631 Anemia in chronic kidney disease: Secondary | ICD-10-CM | POA: Diagnosis not present

## 2022-09-18 DIAGNOSIS — N186 End stage renal disease: Secondary | ICD-10-CM | POA: Diagnosis not present

## 2022-09-18 DIAGNOSIS — N2581 Secondary hyperparathyroidism of renal origin: Secondary | ICD-10-CM | POA: Diagnosis not present

## 2022-09-18 DIAGNOSIS — D631 Anemia in chronic kidney disease: Secondary | ICD-10-CM | POA: Diagnosis not present

## 2022-09-19 DIAGNOSIS — E118 Type 2 diabetes mellitus with unspecified complications: Secondary | ICD-10-CM | POA: Diagnosis not present

## 2022-09-19 DIAGNOSIS — D631 Anemia in chronic kidney disease: Secondary | ICD-10-CM | POA: Diagnosis not present

## 2022-09-19 DIAGNOSIS — N2581 Secondary hyperparathyroidism of renal origin: Secondary | ICD-10-CM | POA: Diagnosis not present

## 2022-09-19 DIAGNOSIS — N186 End stage renal disease: Secondary | ICD-10-CM | POA: Diagnosis not present

## 2022-09-20 DIAGNOSIS — N186 End stage renal disease: Secondary | ICD-10-CM | POA: Diagnosis not present

## 2022-09-20 DIAGNOSIS — D631 Anemia in chronic kidney disease: Secondary | ICD-10-CM | POA: Diagnosis not present

## 2022-09-20 DIAGNOSIS — N2581 Secondary hyperparathyroidism of renal origin: Secondary | ICD-10-CM | POA: Diagnosis not present

## 2022-09-21 DIAGNOSIS — N2581 Secondary hyperparathyroidism of renal origin: Secondary | ICD-10-CM | POA: Diagnosis not present

## 2022-09-21 DIAGNOSIS — D631 Anemia in chronic kidney disease: Secondary | ICD-10-CM | POA: Diagnosis not present

## 2022-09-21 DIAGNOSIS — N186 End stage renal disease: Secondary | ICD-10-CM | POA: Diagnosis not present

## 2022-09-22 DIAGNOSIS — N2581 Secondary hyperparathyroidism of renal origin: Secondary | ICD-10-CM | POA: Diagnosis not present

## 2022-09-22 DIAGNOSIS — D631 Anemia in chronic kidney disease: Secondary | ICD-10-CM | POA: Diagnosis not present

## 2022-09-22 DIAGNOSIS — N186 End stage renal disease: Secondary | ICD-10-CM | POA: Diagnosis not present

## 2022-09-23 DIAGNOSIS — D631 Anemia in chronic kidney disease: Secondary | ICD-10-CM | POA: Diagnosis not present

## 2022-09-23 DIAGNOSIS — N2581 Secondary hyperparathyroidism of renal origin: Secondary | ICD-10-CM | POA: Diagnosis not present

## 2022-09-23 DIAGNOSIS — N186 End stage renal disease: Secondary | ICD-10-CM | POA: Diagnosis not present

## 2022-09-24 DIAGNOSIS — N186 End stage renal disease: Secondary | ICD-10-CM | POA: Diagnosis not present

## 2022-09-24 DIAGNOSIS — Z992 Dependence on renal dialysis: Secondary | ICD-10-CM | POA: Diagnosis not present

## 2022-09-24 DIAGNOSIS — N2581 Secondary hyperparathyroidism of renal origin: Secondary | ICD-10-CM | POA: Diagnosis not present

## 2022-09-24 DIAGNOSIS — D631 Anemia in chronic kidney disease: Secondary | ICD-10-CM | POA: Diagnosis not present

## 2022-09-25 DIAGNOSIS — N186 End stage renal disease: Secondary | ICD-10-CM | POA: Diagnosis not present

## 2022-09-25 DIAGNOSIS — N2581 Secondary hyperparathyroidism of renal origin: Secondary | ICD-10-CM | POA: Diagnosis not present

## 2022-09-25 DIAGNOSIS — D631 Anemia in chronic kidney disease: Secondary | ICD-10-CM | POA: Diagnosis not present

## 2022-09-26 DIAGNOSIS — D631 Anemia in chronic kidney disease: Secondary | ICD-10-CM | POA: Diagnosis not present

## 2022-09-26 DIAGNOSIS — N2581 Secondary hyperparathyroidism of renal origin: Secondary | ICD-10-CM | POA: Diagnosis not present

## 2022-09-26 DIAGNOSIS — N186 End stage renal disease: Secondary | ICD-10-CM | POA: Diagnosis not present

## 2022-09-27 DIAGNOSIS — R6889 Other general symptoms and signs: Secondary | ICD-10-CM | POA: Diagnosis not present

## 2022-09-27 DIAGNOSIS — E119 Type 2 diabetes mellitus without complications: Secondary | ICD-10-CM | POA: Diagnosis not present

## 2022-09-27 DIAGNOSIS — D631 Anemia in chronic kidney disease: Secondary | ICD-10-CM | POA: Diagnosis not present

## 2022-09-27 DIAGNOSIS — N2581 Secondary hyperparathyroidism of renal origin: Secondary | ICD-10-CM | POA: Diagnosis not present

## 2022-09-27 DIAGNOSIS — N186 End stage renal disease: Secondary | ICD-10-CM | POA: Diagnosis not present

## 2022-09-28 DIAGNOSIS — N2581 Secondary hyperparathyroidism of renal origin: Secondary | ICD-10-CM | POA: Diagnosis not present

## 2022-09-28 DIAGNOSIS — D631 Anemia in chronic kidney disease: Secondary | ICD-10-CM | POA: Diagnosis not present

## 2022-09-28 DIAGNOSIS — N186 End stage renal disease: Secondary | ICD-10-CM | POA: Diagnosis not present

## 2022-09-29 DIAGNOSIS — N186 End stage renal disease: Secondary | ICD-10-CM | POA: Diagnosis not present

## 2022-09-29 DIAGNOSIS — N2581 Secondary hyperparathyroidism of renal origin: Secondary | ICD-10-CM | POA: Diagnosis not present

## 2022-09-29 DIAGNOSIS — D631 Anemia in chronic kidney disease: Secondary | ICD-10-CM | POA: Diagnosis not present

## 2022-09-30 DIAGNOSIS — D631 Anemia in chronic kidney disease: Secondary | ICD-10-CM | POA: Diagnosis not present

## 2022-09-30 DIAGNOSIS — N2581 Secondary hyperparathyroidism of renal origin: Secondary | ICD-10-CM | POA: Diagnosis not present

## 2022-09-30 DIAGNOSIS — N186 End stage renal disease: Secondary | ICD-10-CM | POA: Diagnosis not present

## 2022-10-01 DIAGNOSIS — N186 End stage renal disease: Secondary | ICD-10-CM | POA: Diagnosis not present

## 2022-10-01 DIAGNOSIS — D631 Anemia in chronic kidney disease: Secondary | ICD-10-CM | POA: Diagnosis not present

## 2022-10-01 DIAGNOSIS — N2581 Secondary hyperparathyroidism of renal origin: Secondary | ICD-10-CM | POA: Diagnosis not present

## 2022-10-02 DIAGNOSIS — N2581 Secondary hyperparathyroidism of renal origin: Secondary | ICD-10-CM | POA: Diagnosis not present

## 2022-10-02 DIAGNOSIS — N186 End stage renal disease: Secondary | ICD-10-CM | POA: Diagnosis not present

## 2022-10-02 DIAGNOSIS — D631 Anemia in chronic kidney disease: Secondary | ICD-10-CM | POA: Diagnosis not present

## 2022-10-03 DIAGNOSIS — N186 End stage renal disease: Secondary | ICD-10-CM | POA: Diagnosis not present

## 2022-10-03 DIAGNOSIS — N2581 Secondary hyperparathyroidism of renal origin: Secondary | ICD-10-CM | POA: Diagnosis not present

## 2022-10-03 DIAGNOSIS — D631 Anemia in chronic kidney disease: Secondary | ICD-10-CM | POA: Diagnosis not present

## 2022-10-04 DIAGNOSIS — D631 Anemia in chronic kidney disease: Secondary | ICD-10-CM | POA: Diagnosis not present

## 2022-10-04 DIAGNOSIS — R6889 Other general symptoms and signs: Secondary | ICD-10-CM | POA: Diagnosis not present

## 2022-10-04 DIAGNOSIS — N186 End stage renal disease: Secondary | ICD-10-CM | POA: Diagnosis not present

## 2022-10-04 DIAGNOSIS — N2581 Secondary hyperparathyroidism of renal origin: Secondary | ICD-10-CM | POA: Diagnosis not present

## 2022-10-05 DIAGNOSIS — N2581 Secondary hyperparathyroidism of renal origin: Secondary | ICD-10-CM | POA: Diagnosis not present

## 2022-10-05 DIAGNOSIS — N186 End stage renal disease: Secondary | ICD-10-CM | POA: Diagnosis not present

## 2022-10-05 DIAGNOSIS — D631 Anemia in chronic kidney disease: Secondary | ICD-10-CM | POA: Diagnosis not present

## 2022-10-06 DIAGNOSIS — N2581 Secondary hyperparathyroidism of renal origin: Secondary | ICD-10-CM | POA: Diagnosis not present

## 2022-10-06 DIAGNOSIS — D631 Anemia in chronic kidney disease: Secondary | ICD-10-CM | POA: Diagnosis not present

## 2022-10-06 DIAGNOSIS — N186 End stage renal disease: Secondary | ICD-10-CM | POA: Diagnosis not present

## 2022-10-07 DIAGNOSIS — N2581 Secondary hyperparathyroidism of renal origin: Secondary | ICD-10-CM | POA: Diagnosis not present

## 2022-10-07 DIAGNOSIS — D631 Anemia in chronic kidney disease: Secondary | ICD-10-CM | POA: Diagnosis not present

## 2022-10-07 DIAGNOSIS — N186 End stage renal disease: Secondary | ICD-10-CM | POA: Diagnosis not present

## 2022-10-08 DIAGNOSIS — D631 Anemia in chronic kidney disease: Secondary | ICD-10-CM | POA: Diagnosis not present

## 2022-10-08 DIAGNOSIS — N186 End stage renal disease: Secondary | ICD-10-CM | POA: Diagnosis not present

## 2022-10-08 DIAGNOSIS — N2581 Secondary hyperparathyroidism of renal origin: Secondary | ICD-10-CM | POA: Diagnosis not present

## 2022-10-09 DIAGNOSIS — N186 End stage renal disease: Secondary | ICD-10-CM | POA: Diagnosis not present

## 2022-10-09 DIAGNOSIS — D631 Anemia in chronic kidney disease: Secondary | ICD-10-CM | POA: Diagnosis not present

## 2022-10-09 DIAGNOSIS — N2581 Secondary hyperparathyroidism of renal origin: Secondary | ICD-10-CM | POA: Diagnosis not present

## 2022-10-10 DIAGNOSIS — N186 End stage renal disease: Secondary | ICD-10-CM | POA: Diagnosis not present

## 2022-10-10 DIAGNOSIS — N2581 Secondary hyperparathyroidism of renal origin: Secondary | ICD-10-CM | POA: Diagnosis not present

## 2022-10-10 DIAGNOSIS — D631 Anemia in chronic kidney disease: Secondary | ICD-10-CM | POA: Diagnosis not present

## 2022-10-11 ENCOUNTER — Other Ambulatory Visit: Payer: Self-pay | Admitting: Internal Medicine

## 2022-10-11 DIAGNOSIS — D631 Anemia in chronic kidney disease: Secondary | ICD-10-CM | POA: Diagnosis not present

## 2022-10-11 DIAGNOSIS — N186 End stage renal disease: Secondary | ICD-10-CM | POA: Diagnosis not present

## 2022-10-11 DIAGNOSIS — N2581 Secondary hyperparathyroidism of renal origin: Secondary | ICD-10-CM | POA: Diagnosis not present

## 2022-10-12 DIAGNOSIS — N2581 Secondary hyperparathyroidism of renal origin: Secondary | ICD-10-CM | POA: Diagnosis not present

## 2022-10-12 DIAGNOSIS — N186 End stage renal disease: Secondary | ICD-10-CM | POA: Diagnosis not present

## 2022-10-12 DIAGNOSIS — D631 Anemia in chronic kidney disease: Secondary | ICD-10-CM | POA: Diagnosis not present

## 2022-10-13 DIAGNOSIS — N2581 Secondary hyperparathyroidism of renal origin: Secondary | ICD-10-CM | POA: Diagnosis not present

## 2022-10-13 DIAGNOSIS — N186 End stage renal disease: Secondary | ICD-10-CM | POA: Diagnosis not present

## 2022-10-13 DIAGNOSIS — D631 Anemia in chronic kidney disease: Secondary | ICD-10-CM | POA: Diagnosis not present

## 2022-10-14 DIAGNOSIS — N186 End stage renal disease: Secondary | ICD-10-CM | POA: Diagnosis not present

## 2022-10-14 DIAGNOSIS — N2581 Secondary hyperparathyroidism of renal origin: Secondary | ICD-10-CM | POA: Diagnosis not present

## 2022-10-14 DIAGNOSIS — D631 Anemia in chronic kidney disease: Secondary | ICD-10-CM | POA: Diagnosis not present

## 2022-10-15 DIAGNOSIS — D631 Anemia in chronic kidney disease: Secondary | ICD-10-CM | POA: Diagnosis not present

## 2022-10-15 DIAGNOSIS — N2581 Secondary hyperparathyroidism of renal origin: Secondary | ICD-10-CM | POA: Diagnosis not present

## 2022-10-15 DIAGNOSIS — N186 End stage renal disease: Secondary | ICD-10-CM | POA: Diagnosis not present

## 2022-10-16 DIAGNOSIS — N2581 Secondary hyperparathyroidism of renal origin: Secondary | ICD-10-CM | POA: Diagnosis not present

## 2022-10-16 DIAGNOSIS — D631 Anemia in chronic kidney disease: Secondary | ICD-10-CM | POA: Diagnosis not present

## 2022-10-16 DIAGNOSIS — N186 End stage renal disease: Secondary | ICD-10-CM | POA: Diagnosis not present

## 2022-10-17 DIAGNOSIS — D631 Anemia in chronic kidney disease: Secondary | ICD-10-CM | POA: Diagnosis not present

## 2022-10-17 DIAGNOSIS — N186 End stage renal disease: Secondary | ICD-10-CM | POA: Diagnosis not present

## 2022-10-17 DIAGNOSIS — N2581 Secondary hyperparathyroidism of renal origin: Secondary | ICD-10-CM | POA: Diagnosis not present

## 2022-10-18 DIAGNOSIS — D631 Anemia in chronic kidney disease: Secondary | ICD-10-CM | POA: Diagnosis not present

## 2022-10-18 DIAGNOSIS — N2581 Secondary hyperparathyroidism of renal origin: Secondary | ICD-10-CM | POA: Diagnosis not present

## 2022-10-18 DIAGNOSIS — N186 End stage renal disease: Secondary | ICD-10-CM | POA: Diagnosis not present

## 2022-10-19 ENCOUNTER — Ambulatory Visit (INDEPENDENT_AMBULATORY_CARE_PROVIDER_SITE_OTHER): Payer: Medicare HMO | Admitting: Internal Medicine

## 2022-10-19 ENCOUNTER — Ambulatory Visit (INDEPENDENT_AMBULATORY_CARE_PROVIDER_SITE_OTHER): Payer: Medicare HMO

## 2022-10-19 ENCOUNTER — Encounter: Payer: Self-pay | Admitting: Internal Medicine

## 2022-10-19 VITALS — BP 120/82 | HR 70 | Temp 97.9°F | Ht 62.0 in | Wt 136.0 lb

## 2022-10-19 DIAGNOSIS — E1100 Type 2 diabetes mellitus with hyperosmolarity without nonketotic hyperglycemic-hyperosmolar coma (NKHHC): Secondary | ICD-10-CM

## 2022-10-19 DIAGNOSIS — J069 Acute upper respiratory infection, unspecified: Secondary | ICD-10-CM | POA: Diagnosis not present

## 2022-10-19 DIAGNOSIS — R6889 Other general symptoms and signs: Secondary | ICD-10-CM | POA: Diagnosis not present

## 2022-10-19 DIAGNOSIS — I2510 Atherosclerotic heart disease of native coronary artery without angina pectoris: Secondary | ICD-10-CM | POA: Diagnosis not present

## 2022-10-19 DIAGNOSIS — E118 Type 2 diabetes mellitus with unspecified complications: Secondary | ICD-10-CM

## 2022-10-19 DIAGNOSIS — N2581 Secondary hyperparathyroidism of renal origin: Secondary | ICD-10-CM | POA: Diagnosis not present

## 2022-10-19 DIAGNOSIS — J181 Lobar pneumonia, unspecified organism: Secondary | ICD-10-CM | POA: Diagnosis not present

## 2022-10-19 DIAGNOSIS — N186 End stage renal disease: Secondary | ICD-10-CM | POA: Diagnosis not present

## 2022-10-19 DIAGNOSIS — D631 Anemia in chronic kidney disease: Secondary | ICD-10-CM | POA: Diagnosis not present

## 2022-10-19 MED ORDER — PROMETHAZINE-DM 6.25-15 MG/5ML PO SYRP: 5.0000 mL | ORAL_SOLUTION | Freq: Four times a day (QID) | ORAL | 0 refills | Status: DC | PRN

## 2022-10-19 MED ORDER — LEVOFLOXACIN 500 MG PO TABS: 500.0000 mg | ORAL_TABLET | Freq: Every day | ORAL | 0 refills | Status: AC

## 2022-10-19 NOTE — Assessment & Plan Note (Signed)
On [place]

## 2022-10-19 NOTE — Assessment & Plan Note (Signed)
Using Libre 2  

## 2022-10-19 NOTE — Assessment & Plan Note (Signed)
On Lipitor 

## 2022-10-19 NOTE — Assessment & Plan Note (Addendum)
R/o CAP Will get a CXR Prom cough syr Levaquin x 7 d

## 2022-10-19 NOTE — Progress Notes (Signed)
Subjective:  Patient ID: Mary Valencia, female    DOB: 03-13-1956  Age: 67 y.o. MRN: [id number]  CC: No chief complaint on file.   HPI Mary Valencia presents for a URI - cough x 2 weeks. Pt came back from the cruise  F/u on DM, ESRD Labs w/Nephrology   Outpatient Medications Prior to Visit  Medication Sig Dispense Refill   Alcohol Swabs (B-D SINGLE USE SWABS REGULAR) PADS Use as directed to clean site to check blood sugars 180 each 3   ASPIRIN LOW DOSE 81 MG tablet NEW PRESCRIPTION REQUEST: Aspirin 81 MG Tablet,delayed Release - TAKE ONE TABLET BY MOUTH DAILY 90 tablet 3   atorvastatin (LIPITOR) 40 MG tablet NEW PRESCRIPTION REQUEST: Atorvastatin 40 MG Tablet - TAKE ONE TABLET BY MOUTH DAILY 90 tablet 3   calcitRIOL (ROCALTROL) 0.5 MCG capsule Take by mouth. TAKE 1 BY MOUTH DAILY     Continuous Blood Gluc Receiver (FREESTYLE LIBRE 2 READER) DEVI Use as directed 1 each 0   Continuous Blood Gluc Sensor (FREESTYLE LIBRE 2 SENSOR) MISC Use as directed to check blood sugars daily 3 each 3   Continuous Blood Gluc Sensor (FREESTYLE LIBRE 2 SENSOR) MISC 1 Units by Does not apply route every 14 (fourteen) days. 6 each 3   diphenhydrAMINE (BENADRYL) 25 mg capsule Take 25 mg by mouth as needed.     glucose blood test strip TEST BLOOD GLUCOSE THREE TIMES DAILY 300 each 3   GVOKE HYPOPEN 2-PACK 1 MG/0.2ML SOAJ INJECT 0.2 ML UNDER THE SKIN (SUBCUTANEOUSLY) DAILY AS NEEDED 0.4 mL 3   hydrALAZINE (APRESOLINE) 50 MG tablet NEW PRESCRIPTION REQUEST: Hydralazine 50 MG Tablet - TAKE TWO TABLETS BY MOUTH TWICE DAILY 360 tablet 3   insulin NPH Human (NOVOLIN N) 100 UNIT/ML injection Inject into the skin. Inject 8 Units into the skin 2 times daily with meals     losartan (COZAAR) 100 MG tablet Take 1 tablet (100 mg total) by mouth 2 (two) times daily. 180 tablet 3   mupirocin cream (BACTROBAN) 2 % Apply 1 application topically daily. 100 g 3   NIFEdipine (ADALAT CC) 90 MG 24 hr tablet NEW PRESCRIPTION  REQUEST: Nifedipine Er 90 MG Tablet,extended Release - TAKE ONE TABLET BY MOUTH DAILY 90 tablet 3   NIFEdipine (PROCARDIA XL/NIFEDICAL-XL) 90 MG 24 hr tablet      omeprazole (PRILOSEC) 40 MG capsule NEW PRESCRIPTION REQUEST: Omeprazole 40 MG Capsule,delayed Release - TAKE ONE CAPSULE BY MOUTH DAILY 90 capsule 3   polyethylene glycol (MIRALAX / GLYCOLAX) packet Take 17 g by mouth.     pravastatin (PRAVACHOL) 40 MG tablet Take 40 mg by mouth daily.     PRESCRIPTION MEDICATION 210 mg 3 (three) times daily before meals.     VELPHORO 500 MG chewable tablet CHEW 1 THREE TIMES A DAY WITH MEALS     No facility-administered medications prior to visit.    ROS: Review of Systems  Constitutional:  Negative for activity change, appetite change, chills, fatigue and unexpected weight change.  HENT:  Positive for congestion. Negative for mouth sores and sinus pressure.   Eyes:  Negative for visual disturbance.  Respiratory:  Positive for cough. Negative for chest tightness.   Gastrointestinal:  Negative for abdominal pain and nausea.  Genitourinary:  Negative for difficulty urinating, frequency and vaginal pain.  Musculoskeletal:  Negative for back pain and gait problem.  Skin:  Negative for pallor and rash.  Neurological:  Negative for dizziness, tremors, weakness, numbness  and headaches.  Psychiatric/Behavioral:  Negative for confusion and sleep disturbance.     Objective:  BP 120/82 (BP Location: Left Arm, Patient Position: Sitting, Cuff Size: Normal)   Pulse 70   Temp 97.9 F (36.6 C) (Oral)   Ht  (1.575 m)   Wt 136 lb (61.7 kg)   SpO2 96%   BMI 24.87 kg/m   BP Readings from Last 3 Encounters:  10/19/22 120/82  05/29/22 130/78  02/24/22 120/74    Wt Readings from Last 3 Encounters:  10/19/22 136 lb (61.7 kg)  07/20/22 130 lb (59 kg)  05/29/22 132 lb (59.9 kg)    Physical Exam Constitutional:      General: She is not in acute distress.    Appearance: She is not  toxic-appearing.  Musculoskeletal:        General: No tenderness.  Neurological:     Mental Status: She is oriented to person, place, and time.     Motor: Weakness present.     Coordination: Coordination abnormal.     Gait: Gait abnormal.   In a power w/c  Lab Results  Component Value Date   WBC 7.7 08/29/2017   HGB 29.1 09/01/2017   HCT 30 (A) 08/29/2017   PLT 203 08/29/2017   GLUCOSE 279 (H) 09/21/2017   CHOL 161 09/21/2017   TRIG 72.0 09/21/2017   HDL 63.60 09/21/2017   LDLDIRECT 207.8 09/29/2010   LDLCALC 83 09/21/2017   ALT 6 09/21/2017   AST 21 09/21/2017   NA 141 09/21/2017   K 4.9 09/21/2017   CL 98 09/21/2017   CREATININE 13.49 (HH) 09/21/2017   BUN 66 (H) 09/21/2017   CO2 27 09/21/2017   TSH 2.81 09/21/2017   HGBA1C 12.6 (A) 06/16/2021   HGBA1C 12.6 06/16/2021   HGBA1C 12.6 (A) 06/16/2021   HGBA1C 12.6 (A) 06/16/2021    DG Bone Density  Result Date: 12/28/2021 EXAM: DUAL X-RAY ABSORPTIOMETRY (DXA) FOR BONE MINERAL DENSITY IMPRESSION: Referring Physician:  Tresa Garter Your patient completed a bone mineral density test using GE Lunar iDXA system (analysis version: 16). Technologist: SEC PATIENT: Name: Mary, Valencia Patient ID: [id number] Birth Date: [birth year] Height: 62.0 in. Sex: Female Measured: 12/28/2021 Weight: 128.0 lbs. Indications: Estrogen Deficient, Hysterectomy, Postmenopausal Fractures: NONE Treatments: Calcium (E943.0) ASSESSMENT: The BMD measured at Forearm Radius 33% is 0.605 g/cm2 with a T-score of -3.2. This patient is considered osteoporotic according to World Health Organization [facility]) criteria. The quality of the exam is limited by the scannable areas of the patient as the patient is in a motorized wheelchair and is unable to safely achieve and maintain DEXA postioning. Site Region Measured Date Measured Age YA BMD Significant CHANGE T-score Right Forearm Radius 33% 12/28/2021 67.5 -3.2 0.605 g/cm2 World Health Organization [facility]) criteria  for post-menopausal, Caucasian Women: Normal       T-score at or above -1 SD Osteopenia   T-score between -1 and -2.5 SD Osteoporosis T-score at or below -2.5 SD RECOMMENDATION: 1. All patients should optimize calcium and vitamin D intake. 2. Consider FDA-approved medical therapies in postmenopausal women and men aged 57 years and older, based on the following: a. A hip or vertebral (clinical or morphometric) fracture. b. T-score = -2.5 at the femoral neck or spine after appropriate evaluation to exclude secondary causes. c. Low bone mass (T-score between -1.0 and -2.5 at the femoral neck or spine) and a 10-year probability of a hip fracture = 3% or a 10-year probability of a  major osteoporosis-related fracture = 20% based on the [place]-adapted WHO algorithm. d. Clinician judgment and/or patient preferences may indicate treatment for people with 10-year fracture probabilities above or below these levels. FOLLOW-UP: Patients with diagnosis of osteoporosis or at high risk for fracture should have regular bone mineral density tests.? Patients eligible for Medicare are allowed routine testing every 2 years.? The testing frequency can be increased to one year for patients who have rapidly progressing disease, are receiving or discontinuing medical therapy to restore bone mass, or have additional risk factors. I have reviewed this study and agree with the findings. [facility] Electronically Signed   By: Sherian Rein M.D.   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     Follow-up: No follow-ups on file.  Sonda Primes, MD

## 2022-10-20 DIAGNOSIS — N2581 Secondary hyperparathyroidism of renal origin: Secondary | ICD-10-CM | POA: Diagnosis not present

## 2022-10-20 DIAGNOSIS — D631 Anemia in chronic kidney disease: Secondary | ICD-10-CM | POA: Diagnosis not present

## 2022-10-20 DIAGNOSIS — N186 End stage renal disease: Secondary | ICD-10-CM | POA: Diagnosis not present

## 2022-10-21 DIAGNOSIS — D631 Anemia in chronic kidney disease: Secondary | ICD-10-CM | POA: Diagnosis not present

## 2022-10-21 DIAGNOSIS — N2581 Secondary hyperparathyroidism of renal origin: Secondary | ICD-10-CM | POA: Diagnosis not present

## 2022-10-21 DIAGNOSIS — N186 End stage renal disease: Secondary | ICD-10-CM | POA: Diagnosis not present

## 2022-10-22 DIAGNOSIS — N186 End stage renal disease: Secondary | ICD-10-CM | POA: Diagnosis not present

## 2022-10-22 DIAGNOSIS — D631 Anemia in chronic kidney disease: Secondary | ICD-10-CM | POA: Diagnosis not present

## 2022-10-22 DIAGNOSIS — N2581 Secondary hyperparathyroidism of renal origin: Secondary | ICD-10-CM | POA: Diagnosis not present

## 2022-10-23 DIAGNOSIS — N186 End stage renal disease: Secondary | ICD-10-CM | POA: Diagnosis not present

## 2022-10-23 DIAGNOSIS — D631 Anemia in chronic kidney disease: Secondary | ICD-10-CM | POA: Diagnosis not present

## 2022-10-23 DIAGNOSIS — N2581 Secondary hyperparathyroidism of renal origin: Secondary | ICD-10-CM | POA: Diagnosis not present

## 2022-10-24 DIAGNOSIS — N2581 Secondary hyperparathyroidism of renal origin: Secondary | ICD-10-CM | POA: Diagnosis not present

## 2022-10-24 DIAGNOSIS — D631 Anemia in chronic kidney disease: Secondary | ICD-10-CM | POA: Diagnosis not present

## 2022-10-24 DIAGNOSIS — N186 End stage renal disease: Secondary | ICD-10-CM | POA: Diagnosis not present

## 2022-10-25 DIAGNOSIS — D631 Anemia in chronic kidney disease: Secondary | ICD-10-CM | POA: Diagnosis not present

## 2022-10-25 DIAGNOSIS — N2581 Secondary hyperparathyroidism of renal origin: Secondary | ICD-10-CM | POA: Diagnosis not present

## 2022-10-25 DIAGNOSIS — N186 End stage renal disease: Secondary | ICD-10-CM | POA: Diagnosis not present

## 2022-10-26 DIAGNOSIS — N186 End stage renal disease: Secondary | ICD-10-CM | POA: Diagnosis not present

## 2022-10-26 DIAGNOSIS — N2581 Secondary hyperparathyroidism of renal origin: Secondary | ICD-10-CM | POA: Diagnosis not present

## 2022-10-26 DIAGNOSIS — R6889 Other general symptoms and signs: Secondary | ICD-10-CM | POA: Diagnosis not present

## 2022-10-26 DIAGNOSIS — D631 Anemia in chronic kidney disease: Secondary | ICD-10-CM | POA: Diagnosis not present

## 2022-10-26 DIAGNOSIS — E119 Type 2 diabetes mellitus without complications: Secondary | ICD-10-CM | POA: Diagnosis not present

## 2022-10-27 DIAGNOSIS — N2581 Secondary hyperparathyroidism of renal origin: Secondary | ICD-10-CM | POA: Diagnosis not present

## 2022-10-27 DIAGNOSIS — D631 Anemia in chronic kidney disease: Secondary | ICD-10-CM | POA: Diagnosis not present

## 2022-10-27 DIAGNOSIS — N186 End stage renal disease: Secondary | ICD-10-CM | POA: Diagnosis not present

## 2022-10-28 DIAGNOSIS — N186 End stage renal disease: Secondary | ICD-10-CM | POA: Diagnosis not present

## 2022-10-28 DIAGNOSIS — N2581 Secondary hyperparathyroidism of renal origin: Secondary | ICD-10-CM | POA: Diagnosis not present

## 2022-10-28 DIAGNOSIS — D631 Anemia in chronic kidney disease: Secondary | ICD-10-CM | POA: Diagnosis not present

## 2022-10-29 DIAGNOSIS — D631 Anemia in chronic kidney disease: Secondary | ICD-10-CM | POA: Diagnosis not present

## 2022-10-29 DIAGNOSIS — N186 End stage renal disease: Secondary | ICD-10-CM | POA: Diagnosis not present

## 2022-10-29 DIAGNOSIS — N2581 Secondary hyperparathyroidism of renal origin: Secondary | ICD-10-CM | POA: Diagnosis not present

## 2022-10-30 DIAGNOSIS — D631 Anemia in chronic kidney disease: Secondary | ICD-10-CM | POA: Diagnosis not present

## 2022-10-30 DIAGNOSIS — N2581 Secondary hyperparathyroidism of renal origin: Secondary | ICD-10-CM | POA: Diagnosis not present

## 2022-10-30 DIAGNOSIS — N186 End stage renal disease: Secondary | ICD-10-CM | POA: Diagnosis not present

## 2022-10-31 ENCOUNTER — Encounter: Payer: Self-pay | Admitting: Internal Medicine

## 2022-10-31 ENCOUNTER — Ambulatory Visit (INDEPENDENT_AMBULATORY_CARE_PROVIDER_SITE_OTHER): Payer: Medicare HMO | Admitting: Internal Medicine

## 2022-10-31 VITALS — BP 124/82 | HR 73 | Temp 97.6°F | Ht 62.0 in | Wt 121.0 lb

## 2022-10-31 DIAGNOSIS — Z7985 Long-term (current) use of injectable non-insulin antidiabetic drugs: Secondary | ICD-10-CM | POA: Diagnosis not present

## 2022-10-31 DIAGNOSIS — Z794 Long term (current) use of insulin: Secondary | ICD-10-CM

## 2022-10-31 DIAGNOSIS — J189 Pneumonia, unspecified organism: Secondary | ICD-10-CM

## 2022-10-31 DIAGNOSIS — E118 Type 2 diabetes mellitus with unspecified complications: Secondary | ICD-10-CM | POA: Diagnosis not present

## 2022-10-31 DIAGNOSIS — Z8673 Personal history of transient ischemic attack (TIA), and cerebral infarction without residual deficits: Secondary | ICD-10-CM

## 2022-10-31 DIAGNOSIS — R6889 Other general symptoms and signs: Secondary | ICD-10-CM | POA: Diagnosis not present

## 2022-10-31 DIAGNOSIS — J069 Acute upper respiratory infection, unspecified: Secondary | ICD-10-CM | POA: Diagnosis not present

## 2022-10-31 DIAGNOSIS — N2581 Secondary hyperparathyroidism of renal origin: Secondary | ICD-10-CM | POA: Diagnosis not present

## 2022-10-31 DIAGNOSIS — N186 End stage renal disease: Secondary | ICD-10-CM | POA: Diagnosis not present

## 2022-10-31 DIAGNOSIS — D631 Anemia in chronic kidney disease: Secondary | ICD-10-CM | POA: Diagnosis not present

## 2022-10-31 NOTE — Assessment & Plan Note (Signed)
B CAP - ?viral:clinically doing well Mary Valencia did not take the abx (Levaquin) because she felt better Repeat CXR in June

## 2022-10-31 NOTE — Progress Notes (Signed)
Subjective:  Patient ID: Mary Valencia, female    DOB: [birth year]  Age: 67 y.o. MRN: [id number]  CC: Medical Management of Chronic Issues (4 month f/u)   HPI [name] presents for URI/B CAP Noriyah did not take the abx (Levaquin) because she felt better    Outpatient Medications Prior to Visit  Medication Sig Dispense Refill   Alcohol Swabs (B-D SINGLE USE SWABS REGULAR) PADS Use as directed to clean site to check blood sugars 180 each 3   ASPIRIN LOW DOSE 81 MG tablet NEW PRESCRIPTION REQUEST: Aspirin 81 MG Tablet,delayed Release - TAKE ONE TABLET BY MOUTH DAILY 90 tablet 3   atorvastatin (LIPITOR) 40 MG tablet NEW PRESCRIPTION REQUEST: Atorvastatin 40 MG Tablet - TAKE ONE TABLET BY MOUTH DAILY 90 tablet 3   calcitRIOL (ROCALTROL) 0.5 MCG capsule Take by mouth. TAKE 1 BY MOUTH DAILY     Continuous Blood Gluc Receiver (FREESTYLE LIBRE 2 READER) DEVI Use as directed 1 each 0   Continuous Blood Gluc Sensor (FREESTYLE LIBRE 2 SENSOR) MISC Use as directed to check blood sugars daily 3 each 3   Continuous Blood Gluc Sensor (FREESTYLE LIBRE 2 SENSOR) MISC 1 Units by Does not apply route every 14 (fourteen) days. 6 each 3   diphenhydrAMINE (BENADRYL) 25 mg capsule Take 25 mg by mouth as needed.     glucose blood test strip TEST BLOOD GLUCOSE THREE TIMES DAILY 300 each 3   GVOKE HYPOPEN 2-PACK 1 MG/0.2ML SOAJ INJECT 0.2 ML UNDER THE SKIN (SUBCUTANEOUSLY) DAILY AS NEEDED 0.4 mL 3   hydrALAZINE (APRESOLINE) 50 MG tablet NEW PRESCRIPTION REQUEST: Hydralazine 50 MG Tablet - TAKE TWO TABLETS BY MOUTH TWICE DAILY 360 tablet 3   insulin NPH Human (NOVOLIN N) 100 UNIT/ML injection Inject into the skin. Inject 8 Units into the skin 2 times daily with meals     losartan (COZAAR) 100 MG tablet Take 1 tablet (100 mg total) by mouth 2 (two) times daily. 180 tablet 3   mupirocin cream (BACTROBAN) 2 % Apply 1 application topically daily. 100 g 3   NIFEdipine (ADALAT CC) 90 MG 24 hr tablet NEW  PRESCRIPTION REQUEST: Nifedipine Er 90 MG Tablet,extended Release - TAKE ONE TABLET BY MOUTH DAILY 90 tablet 3   NIFEdipine (PROCARDIA XL/NIFEDICAL-XL) 90 MG 24 hr tablet      omeprazole (PRILOSEC) 40 MG capsule NEW PRESCRIPTION REQUEST: Omeprazole 40 MG Capsule,delayed Release - TAKE ONE CAPSULE BY MOUTH DAILY 90 capsule 3   polyethylene glycol (MIRALAX / GLYCOLAX) packet Take 17 g by mouth.     pravastatin (PRAVACHOL) 40 MG tablet Take 40 mg by mouth daily.     PRESCRIPTION MEDICATION 210 mg 3 (three) times daily before meals.     promethazine-dextromethorphan (PROMETHAZINE-DM) 6.25-15 MG/5ML syrup Take 5 mLs by mouth 4 (four) times daily as needed for cough. 240 mL 0   VELPHORO 500 MG chewable tablet CHEW 1 THREE TIMES A DAY WITH MEALS     No facility-administered medications prior to visit.    ROS: Review of Systems  Constitutional:  Negative for activity change, appetite change, chills, fatigue, fever and unexpected weight change.  HENT:  Negative for mouth sores and sinus pressure.   Eyes:  Negative for visual disturbance.  Respiratory:  Negative for cough, chest tightness and wheezing.   Cardiovascular:  Negative for leg swelling.  Gastrointestinal:  Negative for abdominal pain and nausea.  Genitourinary:  Negative for difficulty urinating, frequency and vaginal pain.  Musculoskeletal:  Positive for gait problem. Negative for back pain.  Skin:  Negative for pallor and rash.  Neurological:  Negative for dizziness, tremors, weakness, numbness and headaches.  Psychiatric/Behavioral:  Negative for confusion and sleep disturbance.     Objective:  BP 124/82 (BP Location: Left Arm, Patient Position: Sitting, Cuff Size: Normal)   Pulse 73   Temp 97.6 F (36.4 C) (Temporal)   Ht 5\' 2"  (1.575 m)   Wt 121 lb (54.9 kg)   SpO2 98%   BMI 22.13 kg/m   BP Readings from Last 3 Encounters:  10/31/22 124/82  10/19/22 120/82  05/29/22 130/78    Wt Readings from Last 3 Encounters:   10/31/22 121 lb (54.9 kg)  10/19/22 136 lb (61.7 kg)  07/20/22 130 lb (59 kg)    Physical Exam Constitutional:      General: She is not in acute distress.    Appearance: Normal appearance. She is well-developed.  HENT:     Head: Normocephalic.     Right Ear: External ear normal.     Left Ear: External ear normal.     Nose: Nose normal.  Eyes:     General:        Right eye: No discharge.        Left eye: No discharge.     Conjunctiva/sclera: Conjunctivae normal.     Pupils: Pupils are equal, round, and reactive to light.  Neck:     Thyroid: No thyromegaly.     Vascular: No JVD.     Trachea: No tracheal deviation.  Cardiovascular:     Rate and Rhythm: Normal rate and regular rhythm.     Heart sounds: Normal heart sounds.  Pulmonary:     Effort: No respiratory distress.     Breath sounds: No stridor. No wheezing.  Abdominal:     General: Bowel sounds are normal. There is no distension.     Palpations: Abdomen is soft. There is no mass.     Tenderness: There is no abdominal tenderness. There is no guarding or rebound.  Musculoskeletal:        General: No tenderness.     Cervical back: Normal range of motion and neck supple. No rigidity.  Lymphadenopathy:     Cervical: No cervical adenopathy.  Skin:    Findings: No erythema or rash.  Neurological:     Mental Status: She is oriented to person, place, and time.     Cranial Nerves: No cranial nerve deficit.     Motor: No abnormal muscle tone.     Coordination: Coordination normal.     Deep Tendon Reflexes: Reflexes normal.  Psychiatric:        Behavior: Behavior normal.        Thought Content: Thought content normal.        Judgment: Judgment normal.   In a w/c  Lab Results  Component Value Date   WBC 7.7 08/29/2017   HGB 29.1 09/01/2017   HCT 30 (A) 08/29/2017   PLT 203 08/29/2017   GLUCOSE 279 (H) 09/21/2017   CHOL 161 09/21/2017   TRIG 72.0 09/21/2017   HDL 63.60 09/21/2017   LDLDIRECT 207.8 09/29/2010    LDLCALC 83 09/21/2017   ALT 6 09/21/2017   AST 21 09/21/2017   NA 141 09/21/2017   K 4.9 09/21/2017   CL 98 09/21/2017   CREATININE 13.49 (HH) 09/21/2017   BUN 66 (H) 09/21/2017   CO2 27 09/21/2017   TSH 2.81 09/21/2017   HGBA1C  12.6 (A) 06/16/2021   HGBA1C 12.6 06/16/2021   HGBA1C 12.6 (A) 06/16/2021   HGBA1C 12.6 (A) 06/16/2021    DG Bone Density  Result Date: 12/28/2021 EXAM: DUAL X-RAY ABSORPTIOMETRY (DXA) FOR BONE MINERAL DENSITY IMPRESSION: Referring Physician:  Tresa Garter Your patient completed a bone mineral density test using GE Lunar iDXA system (analysis version: 16). Technologist: SEC PATIENT: Name: Kadie, Seiser Patient ID: [id number] Birth Date: October 06, 1955 Height: 62.0 in. Sex: Female Measured: 12/28/2021 Weight: 128.0 lbs. Indications: Estrogen Deficient, Hysterectomy, Postmenopausal Fractures: NONE Treatments: Calcium (E943.0) ASSESSMENT: The BMD measured at Forearm Radius 33% is 0.605 g/cm2 with a T-score of -3.2. This patient is considered osteoporotic according to World Health Organization [facility]) criteria. The quality of the exam is limited by the scannable areas of the patient as the patient is in a motorized wheelchair and is unable to safely achieve and maintain DEXA postioning. Site Region Measured Date Measured Age YA BMD Significant CHANGE T-score Right Forearm Radius 33% 12/28/2021 67.5 -3.2 0.605 g/cm2 World Health Organization [facility]) criteria for post-menopausal, Caucasian Women: Normal       T-score at or above -1 SD Osteopenia   T-score between -1 and -2.5 SD Osteoporosis T-score at or below -2.5 SD RECOMMENDATION: 1. All patients should optimize calcium and vitamin D intake. 2. Consider FDA-approved medical therapies in postmenopausal women and men aged 66 years and older, based on the following: a. A hip or vertebral (clinical or morphometric) fracture. b. T-score = -2.5 at the femoral neck or spine after appropriate evaluation to exclude secondary causes. c.  Low bone mass (T-score between -1.0 and -2.5 at the femoral neck or spine) and a 10-year probability of a hip fracture = 3% or a 10-year probability of a major osteoporosis-related fracture = 20% based on the [place]-adapted WHO algorithm. d. Clinician judgment and/or patient preferences may indicate treatment for people with 10-year fracture probabilities above or below these levels. FOLLOW-UP: Patients with diagnosis of osteoporosis or at high risk for fracture should have regular bone mineral density tests.? Patients eligible for Medicare are allowed routine testing every 2 years.? The testing frequency can be increased to one year for patients who have rapidly progressing disease, are receiving or discontinuing medical therapy to restore bone mass, or have additional risk factors. I have reviewed this study and agree with the findings. [facility] Electronically Signed   By: Sherian Rein M.D.   On: 12/28/2021 10:48    Assessment & Plan:   Problem List Items Addressed This Visit     Diabetes mellitus type 2 with complications (HCC)    Using Libre 2  Infrequent episodes of hypoglycemia Rx GVOKE pen prn       History of cardioembolic cerebrovascular accident (CVA) - Primary    Using a w/c      CAP (community acquired pneumonia)    B CAP - ?viral:clinically doing well Sanju did not take the abx (Levaquin) because she felt better Repeat CXR in June      Upper respiratory infection    B CAP - ?viral:clinically doing well Jillann did not take the abx (Levaquin) because she felt better Repeat CXR in June      Relevant Orders   DG Chest 2 View      No orders of the defined types were placed in this encounter.     Follow-up: Return in about 5 weeks (around 12/05/2022) for a follow-up visit.  Sonda Primes, MD

## 2022-10-31 NOTE — Assessment & Plan Note (Signed)
Using [place] 2  Infrequent episodes of hypoglycemia Rx GVOKE pen prn

## 2022-10-31 NOTE — Assessment & Plan Note (Signed)
B CAP - ?viral:clinically doing well Mary Valencia did not take the abx (Levaquin) because she felt better Repeat CXR in June 

## 2022-10-31 NOTE — Assessment & Plan Note (Signed)
Using a w/c 

## 2022-11-01 DIAGNOSIS — N2581 Secondary hyperparathyroidism of renal origin: Secondary | ICD-10-CM | POA: Diagnosis not present

## 2022-11-01 DIAGNOSIS — D631 Anemia in chronic kidney disease: Secondary | ICD-10-CM | POA: Diagnosis not present

## 2022-11-01 DIAGNOSIS — N186 End stage renal disease: Secondary | ICD-10-CM | POA: Diagnosis not present

## 2022-11-02 DIAGNOSIS — D631 Anemia in chronic kidney disease: Secondary | ICD-10-CM | POA: Diagnosis not present

## 2022-11-02 DIAGNOSIS — N2581 Secondary hyperparathyroidism of renal origin: Secondary | ICD-10-CM | POA: Diagnosis not present

## 2022-11-02 DIAGNOSIS — N186 End stage renal disease: Secondary | ICD-10-CM | POA: Diagnosis not present

## 2022-11-03 DIAGNOSIS — D631 Anemia in chronic kidney disease: Secondary | ICD-10-CM | POA: Diagnosis not present

## 2022-11-03 DIAGNOSIS — N2581 Secondary hyperparathyroidism of renal origin: Secondary | ICD-10-CM | POA: Diagnosis not present

## 2022-11-03 DIAGNOSIS — N186 End stage renal disease: Secondary | ICD-10-CM | POA: Diagnosis not present

## 2022-11-04 DIAGNOSIS — N2581 Secondary hyperparathyroidism of renal origin: Secondary | ICD-10-CM | POA: Diagnosis not present

## 2022-11-04 DIAGNOSIS — N186 End stage renal disease: Secondary | ICD-10-CM | POA: Diagnosis not present

## 2022-11-04 DIAGNOSIS — D631 Anemia in chronic kidney disease: Secondary | ICD-10-CM | POA: Diagnosis not present

## 2022-11-05 DIAGNOSIS — N186 End stage renal disease: Secondary | ICD-10-CM | POA: Diagnosis not present

## 2022-11-05 DIAGNOSIS — N2581 Secondary hyperparathyroidism of renal origin: Secondary | ICD-10-CM | POA: Diagnosis not present

## 2022-11-05 DIAGNOSIS — D631 Anemia in chronic kidney disease: Secondary | ICD-10-CM | POA: Diagnosis not present

## 2022-11-06 DIAGNOSIS — D631 Anemia in chronic kidney disease: Secondary | ICD-10-CM | POA: Diagnosis not present

## 2022-11-06 DIAGNOSIS — N186 End stage renal disease: Secondary | ICD-10-CM | POA: Diagnosis not present

## 2022-11-06 DIAGNOSIS — N2581 Secondary hyperparathyroidism of renal origin: Secondary | ICD-10-CM | POA: Diagnosis not present

## 2022-11-07 DIAGNOSIS — N186 End stage renal disease: Secondary | ICD-10-CM | POA: Diagnosis not present

## 2022-11-07 DIAGNOSIS — D631 Anemia in chronic kidney disease: Secondary | ICD-10-CM | POA: Diagnosis not present

## 2022-11-07 DIAGNOSIS — N2581 Secondary hyperparathyroidism of renal origin: Secondary | ICD-10-CM | POA: Diagnosis not present

## 2022-11-08 DIAGNOSIS — D631 Anemia in chronic kidney disease: Secondary | ICD-10-CM | POA: Diagnosis not present

## 2022-11-08 DIAGNOSIS — N186 End stage renal disease: Secondary | ICD-10-CM | POA: Diagnosis not present

## 2022-11-08 DIAGNOSIS — N2581 Secondary hyperparathyroidism of renal origin: Secondary | ICD-10-CM | POA: Diagnosis not present

## 2022-11-09 DIAGNOSIS — N2581 Secondary hyperparathyroidism of renal origin: Secondary | ICD-10-CM | POA: Diagnosis not present

## 2022-11-09 DIAGNOSIS — N186 End stage renal disease: Secondary | ICD-10-CM | POA: Diagnosis not present

## 2022-11-09 DIAGNOSIS — D631 Anemia in chronic kidney disease: Secondary | ICD-10-CM | POA: Diagnosis not present

## 2022-11-10 ENCOUNTER — Other Ambulatory Visit: Payer: Self-pay | Admitting: Internal Medicine

## 2022-11-10 DIAGNOSIS — D631 Anemia in chronic kidney disease: Secondary | ICD-10-CM | POA: Diagnosis not present

## 2022-11-10 DIAGNOSIS — N2581 Secondary hyperparathyroidism of renal origin: Secondary | ICD-10-CM | POA: Diagnosis not present

## 2022-11-10 DIAGNOSIS — N186 End stage renal disease: Secondary | ICD-10-CM | POA: Diagnosis not present

## 2022-11-11 DIAGNOSIS — N186 End stage renal disease: Secondary | ICD-10-CM | POA: Diagnosis not present

## 2022-11-11 DIAGNOSIS — D631 Anemia in chronic kidney disease: Secondary | ICD-10-CM | POA: Diagnosis not present

## 2022-11-11 DIAGNOSIS — N2581 Secondary hyperparathyroidism of renal origin: Secondary | ICD-10-CM | POA: Diagnosis not present

## 2022-11-12 DIAGNOSIS — N2581 Secondary hyperparathyroidism of renal origin: Secondary | ICD-10-CM | POA: Diagnosis not present

## 2022-11-12 DIAGNOSIS — N186 End stage renal disease: Secondary | ICD-10-CM | POA: Diagnosis not present

## 2022-11-12 DIAGNOSIS — D631 Anemia in chronic kidney disease: Secondary | ICD-10-CM | POA: Diagnosis not present

## 2022-11-13 DIAGNOSIS — N186 End stage renal disease: Secondary | ICD-10-CM | POA: Diagnosis not present

## 2022-11-13 DIAGNOSIS — N2581 Secondary hyperparathyroidism of renal origin: Secondary | ICD-10-CM | POA: Diagnosis not present

## 2022-11-13 DIAGNOSIS — D631 Anemia in chronic kidney disease: Secondary | ICD-10-CM | POA: Diagnosis not present

## 2022-11-14 DIAGNOSIS — N186 End stage renal disease: Secondary | ICD-10-CM | POA: Diagnosis not present

## 2022-11-14 DIAGNOSIS — N2581 Secondary hyperparathyroidism of renal origin: Secondary | ICD-10-CM | POA: Diagnosis not present

## 2022-11-14 DIAGNOSIS — D631 Anemia in chronic kidney disease: Secondary | ICD-10-CM | POA: Diagnosis not present

## 2022-11-15 DIAGNOSIS — N186 End stage renal disease: Secondary | ICD-10-CM | POA: Diagnosis not present

## 2022-11-15 DIAGNOSIS — N2581 Secondary hyperparathyroidism of renal origin: Secondary | ICD-10-CM | POA: Diagnosis not present

## 2022-11-15 DIAGNOSIS — D631 Anemia in chronic kidney disease: Secondary | ICD-10-CM | POA: Diagnosis not present

## 2022-11-16 DIAGNOSIS — N186 End stage renal disease: Secondary | ICD-10-CM | POA: Diagnosis not present

## 2022-11-16 DIAGNOSIS — N2581 Secondary hyperparathyroidism of renal origin: Secondary | ICD-10-CM | POA: Diagnosis not present

## 2022-11-16 DIAGNOSIS — D631 Anemia in chronic kidney disease: Secondary | ICD-10-CM | POA: Diagnosis not present

## 2022-11-17 DIAGNOSIS — N186 End stage renal disease: Secondary | ICD-10-CM | POA: Diagnosis not present

## 2022-11-17 DIAGNOSIS — D631 Anemia in chronic kidney disease: Secondary | ICD-10-CM | POA: Diagnosis not present

## 2022-11-17 DIAGNOSIS — N2581 Secondary hyperparathyroidism of renal origin: Secondary | ICD-10-CM | POA: Diagnosis not present

## 2022-11-18 DIAGNOSIS — N186 End stage renal disease: Secondary | ICD-10-CM | POA: Diagnosis not present

## 2022-11-18 DIAGNOSIS — N2581 Secondary hyperparathyroidism of renal origin: Secondary | ICD-10-CM | POA: Diagnosis not present

## 2022-11-18 DIAGNOSIS — D631 Anemia in chronic kidney disease: Secondary | ICD-10-CM | POA: Diagnosis not present

## 2022-11-19 DIAGNOSIS — N186 End stage renal disease: Secondary | ICD-10-CM | POA: Diagnosis not present

## 2022-11-19 DIAGNOSIS — N2581 Secondary hyperparathyroidism of renal origin: Secondary | ICD-10-CM | POA: Diagnosis not present

## 2022-11-19 DIAGNOSIS — D631 Anemia in chronic kidney disease: Secondary | ICD-10-CM | POA: Diagnosis not present

## 2022-11-20 DIAGNOSIS — N2581 Secondary hyperparathyroidism of renal origin: Secondary | ICD-10-CM | POA: Diagnosis not present

## 2022-11-20 DIAGNOSIS — D631 Anemia in chronic kidney disease: Secondary | ICD-10-CM | POA: Diagnosis not present

## 2022-11-20 DIAGNOSIS — N186 End stage renal disease: Secondary | ICD-10-CM | POA: Diagnosis not present

## 2022-11-21 ENCOUNTER — Telehealth: Payer: Self-pay | Admitting: Internal Medicine

## 2022-11-21 DIAGNOSIS — N2581 Secondary hyperparathyroidism of renal origin: Secondary | ICD-10-CM | POA: Diagnosis not present

## 2022-11-21 DIAGNOSIS — N186 End stage renal disease: Secondary | ICD-10-CM | POA: Diagnosis not present

## 2022-11-21 DIAGNOSIS — D631 Anemia in chronic kidney disease: Secondary | ICD-10-CM | POA: Diagnosis not present

## 2022-11-21 MED ORDER — LOSARTAN POTASSIUM 100 MG PO TABS: 100.0000 mg | ORAL_TABLET | Freq: Two times a day (BID) | ORAL | 3 refills | Status: DC

## 2022-11-21 NOTE — Telephone Encounter (Signed)
Sent rx to [facility] pharmacy.Marland KitchenRaechel Chute

## 2022-11-21 NOTE — Telephone Encounter (Signed)
Prescription Request  11/21/2022  LOV: 10/31/2022  What is the name of the medication or equipment? losartan (COZAAR) 100 MG tablet   Have you contacted your pharmacy to request a refill? No   Which pharmacy [facility] - [address] [place], [place] - [address] Laure[address] Josephs [address] Laurell Josephs [address] [place] [address] Phone: [phone] Fax: [phone] you like this sent to?    Patient notified that their request is being sent to the clinical staff for review and that they should receive a response within 2 business days.   Please advise at Mobile [phone] (mobile)

## 2022-11-22 DIAGNOSIS — K219 Gastro-esophageal reflux disease without esophagitis: Secondary | ICD-10-CM | POA: Diagnosis not present

## 2022-11-22 DIAGNOSIS — N2581 Secondary hyperparathyroidism of renal origin: Secondary | ICD-10-CM | POA: Diagnosis not present

## 2022-11-22 DIAGNOSIS — D631 Anemia in chronic kidney disease: Secondary | ICD-10-CM | POA: Diagnosis not present

## 2022-11-22 DIAGNOSIS — R6889 Other general symptoms and signs: Secondary | ICD-10-CM | POA: Diagnosis not present

## 2022-11-22 DIAGNOSIS — N186 End stage renal disease: Secondary | ICD-10-CM | POA: Diagnosis not present

## 2022-11-22 DIAGNOSIS — Z86010 Personal history of colonic polyps: Secondary | ICD-10-CM | POA: Diagnosis not present

## 2022-11-22 DIAGNOSIS — K625 Hemorrhage of anus and rectum: Secondary | ICD-10-CM | POA: Diagnosis not present

## 2022-11-23 DIAGNOSIS — D631 Anemia in chronic kidney disease: Secondary | ICD-10-CM | POA: Diagnosis not present

## 2022-11-23 DIAGNOSIS — N2581 Secondary hyperparathyroidism of renal origin: Secondary | ICD-10-CM | POA: Diagnosis not present

## 2022-11-23 DIAGNOSIS — N186 End stage renal disease: Secondary | ICD-10-CM | POA: Diagnosis not present

## 2022-11-24 DIAGNOSIS — N186 End stage renal disease: Secondary | ICD-10-CM | POA: Diagnosis not present

## 2022-11-24 DIAGNOSIS — D631 Anemia in chronic kidney disease: Secondary | ICD-10-CM | POA: Diagnosis not present

## 2022-11-24 DIAGNOSIS — N2581 Secondary hyperparathyroidism of renal origin: Secondary | ICD-10-CM | POA: Diagnosis not present

## 2022-11-25 DIAGNOSIS — D631 Anemia in chronic kidney disease: Secondary | ICD-10-CM | POA: Diagnosis not present

## 2022-11-25 DIAGNOSIS — N2581 Secondary hyperparathyroidism of renal origin: Secondary | ICD-10-CM | POA: Diagnosis not present

## 2022-11-25 DIAGNOSIS — N186 End stage renal disease: Secondary | ICD-10-CM | POA: Diagnosis not present

## 2022-11-25 DIAGNOSIS — Z23 Encounter for immunization: Secondary | ICD-10-CM | POA: Diagnosis not present

## 2022-11-26 DIAGNOSIS — N186 End stage renal disease: Secondary | ICD-10-CM | POA: Diagnosis not present

## 2022-11-26 DIAGNOSIS — D631 Anemia in chronic kidney disease: Secondary | ICD-10-CM | POA: Diagnosis not present

## 2022-11-26 DIAGNOSIS — N2581 Secondary hyperparathyroidism of renal origin: Secondary | ICD-10-CM | POA: Diagnosis not present

## 2022-11-26 DIAGNOSIS — Z23 Encounter for immunization: Secondary | ICD-10-CM | POA: Diagnosis not present

## 2022-11-27 DIAGNOSIS — N186 End stage renal disease: Secondary | ICD-10-CM | POA: Diagnosis not present

## 2022-11-27 DIAGNOSIS — D631 Anemia in chronic kidney disease: Secondary | ICD-10-CM | POA: Diagnosis not present

## 2022-11-27 DIAGNOSIS — N2581 Secondary hyperparathyroidism of renal origin: Secondary | ICD-10-CM | POA: Diagnosis not present

## 2022-11-27 DIAGNOSIS — Z23 Encounter for immunization: Secondary | ICD-10-CM | POA: Diagnosis not present

## 2022-11-28 DIAGNOSIS — D631 Anemia in chronic kidney disease: Secondary | ICD-10-CM | POA: Diagnosis not present

## 2022-11-28 DIAGNOSIS — N2581 Secondary hyperparathyroidism of renal origin: Secondary | ICD-10-CM | POA: Diagnosis not present

## 2022-11-28 DIAGNOSIS — Z23 Encounter for immunization: Secondary | ICD-10-CM | POA: Diagnosis not present

## 2022-11-28 DIAGNOSIS — N186 End stage renal disease: Secondary | ICD-10-CM | POA: Diagnosis not present

## 2022-11-29 DIAGNOSIS — N186 End stage renal disease: Secondary | ICD-10-CM | POA: Diagnosis not present

## 2022-11-29 DIAGNOSIS — Z23 Encounter for immunization: Secondary | ICD-10-CM | POA: Diagnosis not present

## 2022-11-29 DIAGNOSIS — D631 Anemia in chronic kidney disease: Secondary | ICD-10-CM | POA: Diagnosis not present

## 2022-11-29 DIAGNOSIS — N2581 Secondary hyperparathyroidism of renal origin: Secondary | ICD-10-CM | POA: Diagnosis not present

## 2022-11-30 DIAGNOSIS — E119 Type 2 diabetes mellitus without complications: Secondary | ICD-10-CM | POA: Diagnosis not present

## 2022-11-30 DIAGNOSIS — N2581 Secondary hyperparathyroidism of renal origin: Secondary | ICD-10-CM | POA: Diagnosis not present

## 2022-11-30 DIAGNOSIS — D631 Anemia in chronic kidney disease: Secondary | ICD-10-CM | POA: Diagnosis not present

## 2022-11-30 DIAGNOSIS — R6889 Other general symptoms and signs: Secondary | ICD-10-CM | POA: Diagnosis not present

## 2022-11-30 DIAGNOSIS — N186 End stage renal disease: Secondary | ICD-10-CM | POA: Diagnosis not present

## 2022-11-30 DIAGNOSIS — Z23 Encounter for immunization: Secondary | ICD-10-CM | POA: Diagnosis not present

## 2022-12-01 DIAGNOSIS — Z23 Encounter for immunization: Secondary | ICD-10-CM | POA: Diagnosis not present

## 2022-12-01 DIAGNOSIS — D631 Anemia in chronic kidney disease: Secondary | ICD-10-CM | POA: Diagnosis not present

## 2022-12-01 DIAGNOSIS — N186 End stage renal disease: Secondary | ICD-10-CM | POA: Diagnosis not present

## 2022-12-01 DIAGNOSIS — N2581 Secondary hyperparathyroidism of renal origin: Secondary | ICD-10-CM | POA: Diagnosis not present

## 2022-12-02 DIAGNOSIS — D631 Anemia in chronic kidney disease: Secondary | ICD-10-CM | POA: Diagnosis not present

## 2022-12-02 DIAGNOSIS — N186 End stage renal disease: Secondary | ICD-10-CM | POA: Diagnosis not present

## 2022-12-02 DIAGNOSIS — Z23 Encounter for immunization: Secondary | ICD-10-CM | POA: Diagnosis not present

## 2022-12-02 DIAGNOSIS — N2581 Secondary hyperparathyroidism of renal origin: Secondary | ICD-10-CM | POA: Diagnosis not present

## 2022-12-03 DIAGNOSIS — N2581 Secondary hyperparathyroidism of renal origin: Secondary | ICD-10-CM | POA: Diagnosis not present

## 2022-12-03 DIAGNOSIS — N186 End stage renal disease: Secondary | ICD-10-CM | POA: Diagnosis not present

## 2022-12-03 DIAGNOSIS — Z23 Encounter for immunization: Secondary | ICD-10-CM | POA: Diagnosis not present

## 2022-12-03 DIAGNOSIS — D631 Anemia in chronic kidney disease: Secondary | ICD-10-CM | POA: Diagnosis not present

## 2022-12-04 ENCOUNTER — Ambulatory Visit (INDEPENDENT_AMBULATORY_CARE_PROVIDER_SITE_OTHER): Payer: Medicare HMO | Admitting: Internal Medicine

## 2022-12-04 ENCOUNTER — Encounter: Payer: Self-pay | Admitting: Internal Medicine

## 2022-12-04 VITALS — BP 120/82 | HR 73 | Temp 97.7°F | Ht 62.0 in | Wt 121.0 lb

## 2022-12-04 DIAGNOSIS — Z992 Dependence on renal dialysis: Secondary | ICD-10-CM | POA: Diagnosis not present

## 2022-12-04 DIAGNOSIS — N186 End stage renal disease: Secondary | ICD-10-CM

## 2022-12-04 DIAGNOSIS — Z794 Long term (current) use of insulin: Secondary | ICD-10-CM | POA: Diagnosis not present

## 2022-12-04 DIAGNOSIS — E118 Type 2 diabetes mellitus with unspecified complications: Secondary | ICD-10-CM

## 2022-12-04 DIAGNOSIS — D631 Anemia in chronic kidney disease: Secondary | ICD-10-CM | POA: Diagnosis not present

## 2022-12-04 DIAGNOSIS — Z23 Encounter for immunization: Secondary | ICD-10-CM | POA: Diagnosis not present

## 2022-12-04 DIAGNOSIS — Z7985 Long-term (current) use of injectable non-insulin antidiabetic drugs: Secondary | ICD-10-CM | POA: Diagnosis not present

## 2022-12-04 DIAGNOSIS — R6889 Other general symptoms and signs: Secondary | ICD-10-CM | POA: Diagnosis not present

## 2022-12-04 DIAGNOSIS — N2581 Secondary hyperparathyroidism of renal origin: Secondary | ICD-10-CM | POA: Diagnosis not present

## 2022-12-04 DIAGNOSIS — R413 Other amnesia: Secondary | ICD-10-CM

## 2022-12-04 DIAGNOSIS — E559 Vitamin D deficiency, unspecified: Secondary | ICD-10-CM | POA: Diagnosis not present

## 2022-12-04 NOTE — Assessment & Plan Note (Addendum)
Not on a transplant list due to her CVA On PD

## 2022-12-04 NOTE — Assessment & Plan Note (Signed)
On replacement Rx 

## 2022-12-04 NOTE — Progress Notes (Signed)
Subjective:  Patient ID: Mary Valencia, female    DOB: 06/29/1955  Age: 67 y.o. MRN: [id number]  CC: Follow-up (5 WEEK F/U) and Medical Management of Chronic Issues (5 weeks follow up, pt want a memory test done )   HPI Mary Valencia presents for ESRD, s/p CVA Brother is c/o memory loss, not the pt... Glu was 269 4 d ago  Outpatient Medications Prior to Visit  Medication Sig Dispense Refill   Alcohol Swabs (B-D SINGLE USE SWABS REGULAR) PADS Use as directed to clean site to check blood sugars 180 each 3   ASPIRIN LOW DOSE 81 MG tablet TAKE ONE TABLET BY MOUTH DAILY 90 tablet 3   atorvastatin (LIPITOR) 40 MG tablet NEW PRESCRIPTION REQUEST: Atorvastatin 40 MG Tablet - TAKE ONE TABLET BY MOUTH DAILY 90 tablet 3   calcitRIOL (ROCALTROL) 0.5 MCG capsule Take by mouth. TAKE 1 BY MOUTH DAILY     Continuous Blood Gluc Receiver (FREESTYLE LIBRE 2 READER) DEVI Use as directed 1 each 0   Continuous Blood Gluc Sensor (FREESTYLE LIBRE 2 SENSOR) MISC Use as directed to check blood sugars daily 3 each 3   Continuous Blood Gluc Sensor (FREESTYLE LIBRE 2 SENSOR) MISC 1 Units by Does not apply route every 14 (fourteen) days. 6 each 3   diphenhydrAMINE (BENADRYL) 25 mg capsule Take 25 mg by mouth as needed.     glucose blood test strip TEST BLOOD GLUCOSE THREE TIMES DAILY 300 each 3   GVOKE HYPOPEN 2-PACK 1 MG/0.2ML SOAJ INJECT 0.2 ML UNDER THE SKIN (SUBCUTANEOUSLY) DAILY AS NEEDED 0.4 mL 3   hydrALAZINE (APRESOLINE) 50 MG tablet TAKE ONE TABLET BY MOUTH TWICE DAILY 180 tablet 3   insulin NPH Human (NOVOLIN N) 100 UNIT/ML injection Inject into the skin. Inject 8 Units into the skin 2 times daily with meals     losartan (COZAAR) 100 MG tablet Take 1 tablet (100 mg total) by mouth 2 (two) times daily. 180 tablet 3   mupirocin cream (BACTROBAN) 2 % Apply 1 application topically daily. 100 g 3   NIFEdipine (ADALAT CC) 90 MG 24 hr tablet TAKE ONE TABLET BY MOUTH DAILY 90 tablet 3   NIFEdipine (PROCARDIA  XL/NIFEDICAL-XL) 90 MG 24 hr tablet      omeprazole (PRILOSEC) 40 MG capsule TAKE ONE CAPSULE BY MOUTH DAILY 90 capsule 3   polyethylene glycol (MIRALAX / GLYCOLAX) packet Take 17 g by mouth.     pravastatin (PRAVACHOL) 40 MG tablet Take 40 mg by mouth daily.     PRESCRIPTION MEDICATION 210 mg 3 (three) times daily before meals.     promethazine-dextromethorphan (PROMETHAZINE-DM) 6.25-15 MG/5ML syrup Take 5 mLs by mouth 4 (four) times daily as needed for cough. 240 mL 0   VELPHORO 500 MG chewable tablet CHEW 1 THREE TIMES A DAY WITH MEALS     No facility-administered medications prior to visit.    ROS: Review of Systems  Constitutional:  Positive for fatigue. Negative for activity change, appetite change, chills and unexpected weight change.  HENT:  Negative for congestion, mouth sores and sinus pressure.   Eyes:  Negative for visual disturbance.  Respiratory:  Negative for cough and chest tightness.   Gastrointestinal:  Negative for abdominal pain and nausea.  Genitourinary:  Negative for difficulty urinating, frequency and vaginal pain.  Musculoskeletal:  Negative for back pain and gait problem.  Skin:  Negative for pallor and rash.  Neurological:  Positive for weakness. Negative for dizziness, tremors, numbness  and headaches.  Hematological:  Bruises/bleeds easily.  Psychiatric/Behavioral:  Positive for decreased concentration. Negative for confusion, sleep disturbance and suicidal ideas. The patient is not nervous/anxious.     Objective:  BP 120/82   Pulse 73   Temp 97.7 F (36.5 C) (Temporal)   Ht 5\' 2"  (1.575 m)   Wt 121 lb (54.9 kg)   SpO2 95%   BMI 22.13 kg/m   BP Readings from Last 3 Encounters:  12/04/22 120/82  10/31/22 124/82  10/19/22 120/82    Wt Readings from Last 3 Encounters:  12/04/22 121 lb (54.9 kg)  10/31/22 121 lb (54.9 kg)  10/19/22 136 lb (61.7 kg)    Physical Exam Constitutional:      General: She is not in acute distress.    Appearance:  Normal appearance. She is well-developed.  HENT:     Head: Normocephalic.     Right Ear: External ear normal.     Left Ear: External ear normal.     Nose: Nose normal.  Eyes:     General:        Right eye: No discharge.        Left eye: No discharge.     Conjunctiva/sclera: Conjunctivae normal.     Pupils: Pupils are equal, round, and reactive to light.  Neck:     Thyroid: No thyromegaly.     Vascular: No JVD.     Trachea: No tracheal deviation.  Cardiovascular:     Rate and Rhythm: Normal rate and regular rhythm.     Heart sounds: Normal heart sounds.  Pulmonary:     Effort: No respiratory distress.     Breath sounds: No stridor. No wheezing.  Abdominal:     General: Bowel sounds are normal. There is no distension.     Palpations: Abdomen is soft. There is no mass.     Tenderness: There is no abdominal tenderness. There is no guarding or rebound.  Musculoskeletal:        General: No tenderness.     Cervical back: Normal range of motion and neck supple. No rigidity.     Right lower leg: No edema.     Left lower leg: No edema.  Lymphadenopathy:     Cervical: No cervical adenopathy.  Skin:    Findings: No erythema or rash.  Neurological:     Mental Status: She is oriented to person, place, and time.     Cranial Nerves: No cranial nerve deficit.     Motor: No abnormal muscle tone.     Coordination: Coordination normal.     Deep Tendon Reflexes: Reflexes normal.  Psychiatric:        Behavior: Behavior normal.        Thought Content: Thought content normal.        Judgment: Judgment normal.   In a w/c  MMSE 25/30 (failed serial 7s)  Lab Results  Component Value Date   WBC 7.7 08/29/2017   HGB 29.1 09/01/2017   HCT 30 (A) 08/29/2017   PLT 203 08/29/2017   GLUCOSE 279 (H) 09/21/2017   CHOL 161 09/21/2017   TRIG 72.0 09/21/2017   HDL 63.60 09/21/2017   LDLDIRECT 207.8 09/29/2010   LDLCALC 83 09/21/2017   ALT 6 09/21/2017   AST 21 09/21/2017   NA 141 09/21/2017    K 4.9 09/21/2017   CL 98 09/21/2017   CREATININE 13.49 (HH) 09/21/2017   BUN 66 (H) 09/21/2017   CO2 27 09/21/2017   TSH  2.81 09/21/2017   HGBA1C 12.6 (A) 06/16/2021   HGBA1C 12.6 06/16/2021   HGBA1C 12.6 (A) 06/16/2021   HGBA1C 12.6 (A) 06/16/2021    DG Bone Density  Result Date: 12/28/2021 EXAM: DUAL X-RAY ABSORPTIOMETRY (DXA) FOR BONE MINERAL DENSITY IMPRESSION: Referring Physician:  Tresa Garter Your patient completed a bone mineral density test using GE Lunar iDXA system (analysis version: 16). Technologist: SEC PATIENT: Name: Libbey, Duce Patient ID: [id number] Birth Date: 1955-08-05 Height: 62.0 in. Sex: Female Measured: 12/28/2021 Weight: 128.0 lbs. Indications: Estrogen Deficient, Hysterectomy, Postmenopausal Fractures: NONE Treatments: Calcium (E943.0) ASSESSMENT: The BMD measured at Forearm Radius 33% is 0.605 g/cm2 with a T-score of -3.2. This patient is considered osteoporotic according to World Health Organization [facility]) criteria. The quality of the exam is limited by the scannable areas of the patient as the patient is in a motorized wheelchair and is unable to safely achieve and maintain DEXA postioning. Site Region Measured Date Measured Age YA BMD Significant CHANGE T-score Right Forearm Radius 33% 12/28/2021 67.5 -3.2 0.605 g/cm2 World Health Organization [facility]) criteria for post-menopausal, Caucasian Women: Normal       T-score at or above -1 SD Osteopenia   T-score between -1 and -2.5 SD Osteoporosis T-score at or below -2.5 SD RECOMMENDATION: 1. All patients should optimize calcium and vitamin D intake. 2. Consider FDA-approved medical therapies in postmenopausal women and men aged 34 years and older, based on the following: a. A hip or vertebral (clinical or morphometric) fracture. b. T-score = -2.5 at the femoral neck or spine after appropriate evaluation to exclude secondary causes. c. Low bone mass (T-score between -1.0 and -2.5 at the femoral neck or spine) and a  10-year probability of a hip fracture = 3% or a 10-year probability of a major osteoporosis-related fracture = 20% based on the [place]-adapted WHO algorithm. d. Clinician judgment and/or patient preferences may indicate treatment for people with 10-year fracture probabilities above or below these levels. FOLLOW-UP: Patients with diagnosis of osteoporosis or at high risk for fracture should have regular bone mineral density tests.? Patients eligible for Medicare are allowed routine testing every 2 years.? The testing frequency can be increased to one year for patients who have rapidly progressing disease, are receiving or discontinuing medical therapy to restore bone mass, or have additional risk factors. I have reviewed this study and agree with the findings. [facility] Electronically Signed   By: Sherian Rein M.D.   On: 12/28/2021 10:48    Assessment & Plan:   Problem List Items Addressed This Visit     Diabetes mellitus type 2 with complications (HCC)    Check A1c w/Nephrology      ESRD (end stage renal disease) on dialysis [facility])    Not on a transplant list due to her CVA On PD      Vitamin D deficiency    On replacement Rx      Memory problem - Primary    MMSE 25/30 (failed serial 7s) - WNL         No orders of the defined types were placed in this encounter.     Follow-up: Return in about 3 months (around 03/06/2023) for a follow-up visit.  Sonda Primes, MD

## 2022-12-04 NOTE — Assessment & Plan Note (Signed)
MMSE 25/30 (failed serial 7s) - WNL

## 2022-12-04 NOTE — Assessment & Plan Note (Signed)
Check A1c w/Nephrology

## 2022-12-05 DIAGNOSIS — D631 Anemia in chronic kidney disease: Secondary | ICD-10-CM | POA: Diagnosis not present

## 2022-12-05 DIAGNOSIS — N2581 Secondary hyperparathyroidism of renal origin: Secondary | ICD-10-CM | POA: Diagnosis not present

## 2022-12-05 DIAGNOSIS — Z23 Encounter for immunization: Secondary | ICD-10-CM | POA: Diagnosis not present

## 2022-12-05 DIAGNOSIS — N186 End stage renal disease: Secondary | ICD-10-CM | POA: Diagnosis not present

## 2022-12-06 DIAGNOSIS — N2581 Secondary hyperparathyroidism of renal origin: Secondary | ICD-10-CM | POA: Diagnosis not present

## 2022-12-06 DIAGNOSIS — Z23 Encounter for immunization: Secondary | ICD-10-CM | POA: Diagnosis not present

## 2022-12-06 DIAGNOSIS — R6889 Other general symptoms and signs: Secondary | ICD-10-CM | POA: Diagnosis not present

## 2022-12-06 DIAGNOSIS — N186 End stage renal disease: Secondary | ICD-10-CM | POA: Diagnosis not present

## 2022-12-06 DIAGNOSIS — D631 Anemia in chronic kidney disease: Secondary | ICD-10-CM | POA: Diagnosis not present

## 2022-12-07 DIAGNOSIS — Z23 Encounter for immunization: Secondary | ICD-10-CM | POA: Diagnosis not present

## 2022-12-07 DIAGNOSIS — D631 Anemia in chronic kidney disease: Secondary | ICD-10-CM | POA: Diagnosis not present

## 2022-12-07 DIAGNOSIS — N2581 Secondary hyperparathyroidism of renal origin: Secondary | ICD-10-CM | POA: Diagnosis not present

## 2022-12-07 DIAGNOSIS — N186 End stage renal disease: Secondary | ICD-10-CM | POA: Diagnosis not present

## 2022-12-08 DIAGNOSIS — N2581 Secondary hyperparathyroidism of renal origin: Secondary | ICD-10-CM | POA: Diagnosis not present

## 2022-12-08 DIAGNOSIS — N186 End stage renal disease: Secondary | ICD-10-CM | POA: Diagnosis not present

## 2022-12-08 DIAGNOSIS — D631 Anemia in chronic kidney disease: Secondary | ICD-10-CM | POA: Diagnosis not present

## 2022-12-08 DIAGNOSIS — Z23 Encounter for immunization: Secondary | ICD-10-CM | POA: Diagnosis not present

## 2022-12-09 DIAGNOSIS — D631 Anemia in chronic kidney disease: Secondary | ICD-10-CM | POA: Diagnosis not present

## 2022-12-09 DIAGNOSIS — Z23 Encounter for immunization: Secondary | ICD-10-CM | POA: Diagnosis not present

## 2022-12-09 DIAGNOSIS — N186 End stage renal disease: Secondary | ICD-10-CM | POA: Diagnosis not present

## 2022-12-09 DIAGNOSIS — N2581 Secondary hyperparathyroidism of renal origin: Secondary | ICD-10-CM | POA: Diagnosis not present

## 2022-12-10 DIAGNOSIS — N186 End stage renal disease: Secondary | ICD-10-CM | POA: Diagnosis not present

## 2022-12-10 DIAGNOSIS — Z23 Encounter for immunization: Secondary | ICD-10-CM | POA: Diagnosis not present

## 2022-12-10 DIAGNOSIS — D631 Anemia in chronic kidney disease: Secondary | ICD-10-CM | POA: Diagnosis not present

## 2022-12-10 DIAGNOSIS — N2581 Secondary hyperparathyroidism of renal origin: Secondary | ICD-10-CM | POA: Diagnosis not present

## 2022-12-11 DIAGNOSIS — N2581 Secondary hyperparathyroidism of renal origin: Secondary | ICD-10-CM | POA: Diagnosis not present

## 2022-12-11 DIAGNOSIS — N186 End stage renal disease: Secondary | ICD-10-CM | POA: Diagnosis not present

## 2022-12-11 DIAGNOSIS — D631 Anemia in chronic kidney disease: Secondary | ICD-10-CM | POA: Diagnosis not present

## 2022-12-11 DIAGNOSIS — Z23 Encounter for immunization: Secondary | ICD-10-CM | POA: Diagnosis not present

## 2022-12-12 DIAGNOSIS — N2581 Secondary hyperparathyroidism of renal origin: Secondary | ICD-10-CM | POA: Diagnosis not present

## 2022-12-12 DIAGNOSIS — N186 End stage renal disease: Secondary | ICD-10-CM | POA: Diagnosis not present

## 2022-12-12 DIAGNOSIS — D631 Anemia in chronic kidney disease: Secondary | ICD-10-CM | POA: Diagnosis not present

## 2022-12-12 DIAGNOSIS — Z23 Encounter for immunization: Secondary | ICD-10-CM | POA: Diagnosis not present

## 2022-12-13 DIAGNOSIS — N2581 Secondary hyperparathyroidism of renal origin: Secondary | ICD-10-CM | POA: Diagnosis not present

## 2022-12-13 DIAGNOSIS — N186 End stage renal disease: Secondary | ICD-10-CM | POA: Diagnosis not present

## 2022-12-13 DIAGNOSIS — D631 Anemia in chronic kidney disease: Secondary | ICD-10-CM | POA: Diagnosis not present

## 2022-12-13 DIAGNOSIS — Z23 Encounter for immunization: Secondary | ICD-10-CM | POA: Diagnosis not present

## 2022-12-14 ENCOUNTER — Other Ambulatory Visit: Payer: Self-pay | Admitting: Internal Medicine

## 2022-12-14 DIAGNOSIS — Z23 Encounter for immunization: Secondary | ICD-10-CM | POA: Diagnosis not present

## 2022-12-14 DIAGNOSIS — N186 End stage renal disease: Secondary | ICD-10-CM | POA: Diagnosis not present

## 2022-12-14 DIAGNOSIS — D631 Anemia in chronic kidney disease: Secondary | ICD-10-CM | POA: Diagnosis not present

## 2022-12-14 DIAGNOSIS — N2581 Secondary hyperparathyroidism of renal origin: Secondary | ICD-10-CM | POA: Diagnosis not present

## 2022-12-15 DIAGNOSIS — Z23 Encounter for immunization: Secondary | ICD-10-CM | POA: Diagnosis not present

## 2022-12-15 DIAGNOSIS — N2581 Secondary hyperparathyroidism of renal origin: Secondary | ICD-10-CM | POA: Diagnosis not present

## 2022-12-15 DIAGNOSIS — N186 End stage renal disease: Secondary | ICD-10-CM | POA: Diagnosis not present

## 2022-12-15 DIAGNOSIS — D631 Anemia in chronic kidney disease: Secondary | ICD-10-CM | POA: Diagnosis not present

## 2022-12-16 DIAGNOSIS — N2581 Secondary hyperparathyroidism of renal origin: Secondary | ICD-10-CM | POA: Diagnosis not present

## 2022-12-16 DIAGNOSIS — Z23 Encounter for immunization: Secondary | ICD-10-CM | POA: Diagnosis not present

## 2022-12-16 DIAGNOSIS — D631 Anemia in chronic kidney disease: Secondary | ICD-10-CM | POA: Diagnosis not present

## 2022-12-16 DIAGNOSIS — N186 End stage renal disease: Secondary | ICD-10-CM | POA: Diagnosis not present

## 2022-12-17 DIAGNOSIS — Z23 Encounter for immunization: Secondary | ICD-10-CM | POA: Diagnosis not present

## 2022-12-17 DIAGNOSIS — N2581 Secondary hyperparathyroidism of renal origin: Secondary | ICD-10-CM | POA: Diagnosis not present

## 2022-12-17 DIAGNOSIS — N186 End stage renal disease: Secondary | ICD-10-CM | POA: Diagnosis not present

## 2022-12-17 DIAGNOSIS — D631 Anemia in chronic kidney disease: Secondary | ICD-10-CM | POA: Diagnosis not present

## 2022-12-18 DIAGNOSIS — D631 Anemia in chronic kidney disease: Secondary | ICD-10-CM | POA: Diagnosis not present

## 2022-12-18 DIAGNOSIS — Z23 Encounter for immunization: Secondary | ICD-10-CM | POA: Diagnosis not present

## 2022-12-18 DIAGNOSIS — E118 Type 2 diabetes mellitus with unspecified complications: Secondary | ICD-10-CM | POA: Diagnosis not present

## 2022-12-18 DIAGNOSIS — N186 End stage renal disease: Secondary | ICD-10-CM | POA: Diagnosis not present

## 2022-12-18 DIAGNOSIS — N2581 Secondary hyperparathyroidism of renal origin: Secondary | ICD-10-CM | POA: Diagnosis not present

## 2022-12-19 DIAGNOSIS — Z23 Encounter for immunization: Secondary | ICD-10-CM | POA: Diagnosis not present

## 2022-12-19 DIAGNOSIS — D631 Anemia in chronic kidney disease: Secondary | ICD-10-CM | POA: Diagnosis not present

## 2022-12-19 DIAGNOSIS — N186 End stage renal disease: Secondary | ICD-10-CM | POA: Diagnosis not present

## 2022-12-19 DIAGNOSIS — N2581 Secondary hyperparathyroidism of renal origin: Secondary | ICD-10-CM | POA: Diagnosis not present

## 2022-12-20 DIAGNOSIS — D631 Anemia in chronic kidney disease: Secondary | ICD-10-CM | POA: Diagnosis not present

## 2022-12-20 DIAGNOSIS — N2581 Secondary hyperparathyroidism of renal origin: Secondary | ICD-10-CM | POA: Diagnosis not present

## 2022-12-20 DIAGNOSIS — N186 End stage renal disease: Secondary | ICD-10-CM | POA: Diagnosis not present

## 2022-12-20 DIAGNOSIS — Z23 Encounter for immunization: Secondary | ICD-10-CM | POA: Diagnosis not present

## 2022-12-21 DIAGNOSIS — D631 Anemia in chronic kidney disease: Secondary | ICD-10-CM | POA: Diagnosis not present

## 2022-12-21 DIAGNOSIS — N2581 Secondary hyperparathyroidism of renal origin: Secondary | ICD-10-CM | POA: Diagnosis not present

## 2022-12-21 DIAGNOSIS — Z23 Encounter for immunization: Secondary | ICD-10-CM | POA: Diagnosis not present

## 2022-12-21 DIAGNOSIS — N186 End stage renal disease: Secondary | ICD-10-CM | POA: Diagnosis not present

## 2022-12-22 DIAGNOSIS — N2581 Secondary hyperparathyroidism of renal origin: Secondary | ICD-10-CM | POA: Diagnosis not present

## 2022-12-22 DIAGNOSIS — N186 End stage renal disease: Secondary | ICD-10-CM | POA: Diagnosis not present

## 2022-12-22 DIAGNOSIS — D631 Anemia in chronic kidney disease: Secondary | ICD-10-CM | POA: Diagnosis not present

## 2022-12-22 DIAGNOSIS — Z23 Encounter for immunization: Secondary | ICD-10-CM | POA: Diagnosis not present

## 2022-12-23 DIAGNOSIS — Z23 Encounter for immunization: Secondary | ICD-10-CM | POA: Diagnosis not present

## 2022-12-23 DIAGNOSIS — N2581 Secondary hyperparathyroidism of renal origin: Secondary | ICD-10-CM | POA: Diagnosis not present

## 2022-12-23 DIAGNOSIS — N186 End stage renal disease: Secondary | ICD-10-CM | POA: Diagnosis not present

## 2022-12-23 DIAGNOSIS — D631 Anemia in chronic kidney disease: Secondary | ICD-10-CM | POA: Diagnosis not present

## 2022-12-24 DIAGNOSIS — N186 End stage renal disease: Secondary | ICD-10-CM | POA: Diagnosis not present

## 2022-12-24 DIAGNOSIS — Z992 Dependence on renal dialysis: Secondary | ICD-10-CM | POA: Diagnosis not present

## 2022-12-24 DIAGNOSIS — N2581 Secondary hyperparathyroidism of renal origin: Secondary | ICD-10-CM | POA: Diagnosis not present

## 2022-12-24 DIAGNOSIS — Z23 Encounter for immunization: Secondary | ICD-10-CM | POA: Diagnosis not present

## 2022-12-24 DIAGNOSIS — D631 Anemia in chronic kidney disease: Secondary | ICD-10-CM | POA: Diagnosis not present

## 2022-12-25 DIAGNOSIS — N2581 Secondary hyperparathyroidism of renal origin: Secondary | ICD-10-CM | POA: Diagnosis not present

## 2022-12-25 DIAGNOSIS — E119 Type 2 diabetes mellitus without complications: Secondary | ICD-10-CM | POA: Diagnosis not present

## 2022-12-25 DIAGNOSIS — N186 End stage renal disease: Secondary | ICD-10-CM | POA: Diagnosis not present

## 2022-12-25 DIAGNOSIS — R6889 Other general symptoms and signs: Secondary | ICD-10-CM | POA: Diagnosis not present

## 2022-12-25 DIAGNOSIS — D631 Anemia in chronic kidney disease: Secondary | ICD-10-CM | POA: Diagnosis not present

## 2022-12-26 ENCOUNTER — Encounter: Payer: Self-pay | Admitting: Internal Medicine

## 2022-12-26 ENCOUNTER — Ambulatory Visit (INDEPENDENT_AMBULATORY_CARE_PROVIDER_SITE_OTHER): Payer: Medicare HMO | Admitting: Internal Medicine

## 2022-12-26 VITALS — BP 110/68 | HR 75 | Temp 98.6°F | Ht 62.0 in | Wt 128.0 lb

## 2022-12-26 DIAGNOSIS — R6889 Other general symptoms and signs: Secondary | ICD-10-CM | POA: Diagnosis not present

## 2022-12-26 DIAGNOSIS — H539 Unspecified visual disturbance: Secondary | ICD-10-CM

## 2022-12-26 DIAGNOSIS — H9190 Unspecified hearing loss, unspecified ear: Secondary | ICD-10-CM | POA: Insufficient documentation

## 2022-12-26 DIAGNOSIS — Z8673 Personal history of transient ischemic attack (TIA), and cerebral infarction without residual deficits: Secondary | ICD-10-CM | POA: Diagnosis not present

## 2022-12-26 DIAGNOSIS — H9193 Unspecified hearing loss, bilateral: Secondary | ICD-10-CM | POA: Diagnosis not present

## 2022-12-26 DIAGNOSIS — I69152 Hemiplegia and hemiparesis following nontraumatic intracerebral hemorrhage affecting left dominant side: Secondary | ICD-10-CM

## 2022-12-26 DIAGNOSIS — E1100 Type 2 diabetes mellitus with hyperosmolarity without nonketotic hyperglycemic-hyperosmolar coma (NKHHC): Secondary | ICD-10-CM | POA: Diagnosis not present

## 2022-12-26 DIAGNOSIS — D631 Anemia in chronic kidney disease: Secondary | ICD-10-CM | POA: Diagnosis not present

## 2022-12-26 DIAGNOSIS — N2581 Secondary hyperparathyroidism of renal origin: Secondary | ICD-10-CM | POA: Diagnosis not present

## 2022-12-26 DIAGNOSIS — Z794 Long term (current) use of insulin: Secondary | ICD-10-CM | POA: Diagnosis not present

## 2022-12-26 DIAGNOSIS — N186 End stage renal disease: Secondary | ICD-10-CM | POA: Diagnosis not present

## 2022-12-26 NOTE — Assessment & Plan Note (Signed)
  Audiology ref for ?hearing aids

## 2022-12-26 NOTE — Assessment & Plan Note (Signed)
Worse Will start home PT w/[facility]

## 2022-12-26 NOTE — Assessment & Plan Note (Signed)
Ophthalmology appt

## 2022-12-26 NOTE — Assessment & Plan Note (Signed)
Labs per nephrology

## 2022-12-26 NOTE — Progress Notes (Signed)
Subjective:  Patient ID: Mary Valencia, female    DOB: May 28, 1956  Age: 67 y.o. MRN: [id number]  CC: Follow-up   HPI Mary Valencia presents for memory fragmentation, forgetfulness per her brother Mary Valencia who is here with her. Mary Valencia forgets to take her med, lock the door etc per Mary Valencia. Issues are sporadic. Mary Valencia denies it. In June 2024 her MMSE 25/30 (failed serial 7s)  C/o R hemiparesis C/o hearing loss, eyesight has changed....  Outpatient Medications Prior to Visit  Medication Sig Dispense Refill   Alcohol Swabs (B-D SINGLE USE SWABS REGULAR) PADS Use as directed to clean site to check blood sugars 180 each 3   ASPIRIN LOW DOSE 81 MG tablet TAKE ONE TABLET BY MOUTH DAILY 90 tablet 3   atorvastatin (LIPITOR) 40 MG tablet Take 1 tablet (40 mg total) by mouth every evening. Need cholesterol check for future refills 90 tablet 0   calcitRIOL (ROCALTROL) 0.5 MCG capsule Take by mouth. TAKE 1 BY MOUTH DAILY     Continuous Blood Gluc Receiver (FREESTYLE LIBRE 2 READER) DEVI Use as directed 1 each 0   Continuous Blood Gluc Sensor (FREESTYLE LIBRE 2 SENSOR) MISC Use as directed to check blood sugars daily 3 each 3   Continuous Blood Gluc Sensor (FREESTYLE LIBRE 2 SENSOR) MISC 1 Units by Does not apply route every 14 (fourteen) days. 6 each 3   diphenhydrAMINE (BENADRYL) 25 mg capsule Take 25 mg by mouth as needed.     glucose blood test strip TEST BLOOD GLUCOSE THREE TIMES DAILY 300 each 3   GVOKE HYPOPEN 2-PACK 1 MG/0.2ML SOAJ INJECT 0.2 ML UNDER THE SKIN (SUBCUTANEOUSLY) DAILY AS NEEDED 0.4 mL 3   hydrALAZINE (APRESOLINE) 50 MG tablet TAKE ONE TABLET BY MOUTH TWICE DAILY 180 tablet 3   insulin NPH Human (NOVOLIN N) 100 UNIT/ML injection Inject into the skin. Inject 8 Units into the skin 2 times daily with meals     losartan (COZAAR) 100 MG tablet Take 1 tablet (100 mg total) by mouth 2 (two) times daily. 180 tablet 3   mupirocin cream (BACTROBAN) 2 % Apply 1 application topically daily.  100 g 3   NIFEdipine (ADALAT CC) 90 MG 24 hr tablet TAKE ONE TABLET BY MOUTH DAILY 90 tablet 3   NIFEdipine (PROCARDIA XL/NIFEDICAL-XL) 90 MG 24 hr tablet      omeprazole (PRILOSEC) 40 MG capsule TAKE ONE CAPSULE BY MOUTH DAILY 90 capsule 3   polyethylene glycol (MIRALAX / GLYCOLAX) packet Take 17 g by mouth.     pravastatin (PRAVACHOL) 40 MG tablet Take 40 mg by mouth daily.     PRESCRIPTION MEDICATION 210 mg 3 (three) times daily before meals.     promethazine-dextromethorphan (PROMETHAZINE-DM) 6.25-15 MG/5ML syrup Take 5 mLs by mouth 4 (four) times daily as needed for cough. 240 mL 0   VELPHORO 500 MG chewable tablet CHEW 1 THREE TIMES A DAY WITH MEALS     No facility-administered medications prior to visit.    ROS: Review of Systems  Constitutional:  Positive for fatigue. Negative for activity change, appetite change, chills and unexpected weight change.  HENT:  Positive for hearing loss. Negative for congestion, mouth sores and sinus pressure.   Eyes:  Positive for visual disturbance.  Respiratory:  Negative for cough and chest tightness.   Gastrointestinal:  Negative for abdominal pain and nausea.  Genitourinary:  Negative for difficulty urinating, frequency and vaginal pain.  Musculoskeletal:  Positive for gait problem. Negative for back pain.  Skin:  Negative for pallor and rash.  Neurological:  Positive for weakness. Negative for dizziness, tremors, numbness and headaches.  Hematological:  Bruises/bleeds easily.  Psychiatric/Behavioral:  Positive for decreased concentration. Negative for agitation, confusion, sleep disturbance and suicidal ideas. The patient is not nervous/anxious.     Objective:  BP 110/68 (BP Location: Left Arm, Patient Position: Sitting, Cuff Size: Small)   Pulse 75   Temp 98.6 F (37 C) (Oral)   Ht 5\' 2"  (1.575 m)   Wt 128 lb (58.1 kg)   SpO2 94%   BMI 23.41 kg/m   BP Readings from Last 3 Encounters:  12/26/22 110/68  12/04/22 120/82  10/31/22  124/82    Wt Readings from Last 3 Encounters:  12/26/22 128 lb (58.1 kg)  12/04/22 121 lb (54.9 kg)  10/31/22 121 lb (54.9 kg)    Physical Exam Constitutional:      General: She is not in acute distress.    Appearance: She is well-developed.  HENT:     Head: Normocephalic.     Right Ear: External ear normal.     Left Ear: External ear normal.     Nose: Nose normal.  Eyes:     General:        Right eye: No discharge.        Left eye: No discharge.     Conjunctiva/sclera: Conjunctivae normal.     Pupils: Pupils are equal, round, and reactive to light.  Neck:     Thyroid: No thyromegaly.     Vascular: No JVD.     Trachea: No tracheal deviation.  Cardiovascular:     Rate and Rhythm: Normal rate and regular rhythm.     Heart sounds: Normal heart sounds.  Pulmonary:     Effort: No respiratory distress.     Breath sounds: No stridor. No wheezing.  Abdominal:     General: Bowel sounds are normal. There is no distension.     Palpations: Abdomen is soft. There is no mass.     Tenderness: There is no abdominal tenderness. There is no guarding or rebound.  Musculoskeletal:        General: No tenderness.     Cervical back: Normal range of motion and neck supple. No rigidity.     Right lower leg: No edema.     Left lower leg: No edema.  Lymphadenopathy:     Cervical: No cervical adenopathy.  Skin:    Findings: No erythema or rash.  Neurological:     Mental Status: Mental status is at baseline.     Cranial Nerves: No cranial nerve deficit.     Motor: Weakness present. No abnormal muscle tone.     Coordination: Coordination abnormal.     Gait: Gait abnormal.     Deep Tendon Reflexes: Reflexes normal.  Psychiatric:        Behavior: Behavior normal.        Thought Content: Thought content normal.        Judgment: Judgment normal.   R hemiparesis    A total time of 45 minutes was spent preparing to see the patient, reviewing tests, x-rays, operative reports and other  medical records.  Also, obtaining history and performing comprehensive physical exam.  Additionally, counseling the patient regarding the above listed issues.   Finally, documenting clinical information in the health records, coordination of care, educating the patient.   Lab Results  Component Value Date   WBC 7.7 08/29/2017   HGB 29.1 09/01/2017  HCT 30 (A) 08/29/2017   PLT 203 08/29/2017   GLUCOSE 279 (H) 09/21/2017   CHOL 161 09/21/2017   TRIG 72.0 09/21/2017   HDL 63.60 09/21/2017   LDLDIRECT 207.8 09/29/2010   LDLCALC 83 09/21/2017   ALT 6 09/21/2017   AST 21 09/21/2017   NA 141 09/21/2017   K 4.9 09/21/2017   CL 98 09/21/2017   CREATININE 13.49 (HH) 09/21/2017   BUN 66 (H) 09/21/2017   CO2 27 09/21/2017   TSH 2.81 09/21/2017   HGBA1C 12.6 (A) 06/16/2021   HGBA1C 12.6 06/16/2021   HGBA1C 12.6 (A) 06/16/2021   HGBA1C 12.6 (A) 06/16/2021    DG Bone Density  Result Date: 12/28/2021 EXAM: DUAL X-RAY ABSORPTIOMETRY (DXA) FOR BONE MINERAL DENSITY IMPRESSION: Referring Physician:  Tresa Garter Your patient completed a bone mineral density test using GE Lunar iDXA system (analysis version: 16). Technologist: SEC PATIENT: Name: Berthenia, Crosier Patient ID: [id number] Birth Date: 1955-09-25 Height: 62.0 in. Sex: Female Measured: 12/28/2021 Weight: 128.0 lbs. Indications: Estrogen Deficient, Hysterectomy, Postmenopausal Fractures: NONE Treatments: Calcium (E943.0) ASSESSMENT: The BMD measured at Forearm Radius 33% is 0.605 g/cm2 with a T-score of -3.2. This patient is considered osteoporotic according to World Health Organization [facility]) criteria. The quality of the exam is limited by the scannable areas of the patient as the patient is in a motorized wheelchair and is unable to safely achieve and maintain DEXA postioning. Site Region Measured Date Measured Age YA BMD Significant CHANGE T-score Right Forearm Radius 33% 12/28/2021 67.5 -3.2 0.605 g/cm2 World Health Organization [facility])  criteria for post-menopausal, Caucasian Women: Normal       T-score at or above -1 SD Osteopenia   T-score between -1 and -2.5 SD Osteoporosis T-score at or below -2.5 SD RECOMMENDATION: 1. All patients should optimize calcium and vitamin D intake. 2. Consider FDA-approved medical therapies in postmenopausal women and men aged 33 years and older, based on the following: a. A hip or vertebral (clinical or morphometric) fracture. b. T-score = -2.5 at the femoral neck or spine after appropriate evaluation to exclude secondary causes. c. Low bone mass (T-score between -1.0 and -2.5 at the femoral neck or spine) and a 10-year probability of a hip fracture = 3% or a 10-year probability of a major osteoporosis-related fracture = 20% based on the [place]-adapted WHO algorithm. d. Clinician judgment and/or patient preferences may indicate treatment for people with 10-year fracture probabilities above or below these levels. FOLLOW-UP: Patients with diagnosis of osteoporosis or at high risk for fracture should have regular bone mineral density tests.? Patients eligible for Medicare are allowed routine testing every 2 years.? The testing frequency can be increased to one year for patients who have rapidly progressing disease, are receiving or discontinuing medical therapy to restore bone mass, or have additional risk factors. I have reviewed this study and agree with the findings. [facility] Electronically Signed   By: Sherian Rein M.D.   On: 12/28/2021 10:48    Assessment & Plan:   Problem List Items Addressed This Visit     History of cardioembolic cerebrovascular accident (CVA)   Relevant Orders   Ambulatory referral to Home Health   Type 2 diabetes mellitus with hyperosmolar nonketotic hyperglycemia (HCC)    Labs per nephrology      Forgetfulness - Primary    C/o memory fragmentation, forgetfulness per her brother Mary Valencia who is here with her. Mary Valencia forgets to take her med, lock the door etc per  Mary Valencia. Issues  are sporadic. Pagan denies it. In June 2024 her MMSE 25/30 (failed serial 7s)  Aricept Rx, Neurology ref was offered - declined C/o memory fragmentation, forgetfulness per her brother Mary Valencia who is here with her. Mary Valencia forgets to take her med, lock the door etc per Mary Valencia. Issues are sporadic. Mary Valencia denies it. 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     Follow-up: Return in about 3 months (around 03/28/2023) for a follow-up visit.  Sonda Primes, MD

## 2022-12-26 NOTE — Assessment & Plan Note (Signed)
C/o memory fragmentation, forgetfulness per her brother Mary Valencia who is here with her. Mary Valencia forgets to take her med, lock the door etc per Mary Valencia. Issues are sporadic. Mary Valencia denies it. In June 2024 her MMSE 25/30 (failed serial 7s)  Aricept Rx, Neurology ref was offered - declined C/o memory fragmentation, forgetfulness per her brother Mary Valencia who is here with her. Mary Valencia forgets to take her med, lock the door etc per Mary Valencia. Issues are sporadic. Mary Valencia denies it. In June 2024 her MMSE 25/30 (failed serial 7s)  Aricept Rx, Neurology ref was offered - declined Audiology ref for ?hearing aids

## 2022-12-27 DIAGNOSIS — N2581 Secondary hyperparathyroidism of renal origin: Secondary | ICD-10-CM | POA: Diagnosis not present

## 2022-12-27 DIAGNOSIS — N186 End stage renal disease: Secondary | ICD-10-CM | POA: Diagnosis not present

## 2022-12-27 DIAGNOSIS — D631 Anemia in chronic kidney disease: Secondary | ICD-10-CM | POA: Diagnosis not present

## 2022-12-28 DIAGNOSIS — N2581 Secondary hyperparathyroidism of renal origin: Secondary | ICD-10-CM | POA: Diagnosis not present

## 2022-12-28 DIAGNOSIS — D631 Anemia in chronic kidney disease: Secondary | ICD-10-CM | POA: Diagnosis not present

## 2022-12-28 DIAGNOSIS — N186 End stage renal disease: Secondary | ICD-10-CM | POA: Diagnosis not present

## 2022-12-29 DIAGNOSIS — N186 End stage renal disease: Secondary | ICD-10-CM | POA: Diagnosis not present

## 2022-12-29 DIAGNOSIS — D631 Anemia in chronic kidney disease: Secondary | ICD-10-CM | POA: Diagnosis not present

## 2022-12-29 DIAGNOSIS — N2581 Secondary hyperparathyroidism of renal origin: Secondary | ICD-10-CM | POA: Diagnosis not present

## 2022-12-30 DIAGNOSIS — D631 Anemia in chronic kidney disease: Secondary | ICD-10-CM | POA: Diagnosis not present

## 2022-12-30 DIAGNOSIS — N186 End stage renal disease: Secondary | ICD-10-CM | POA: Diagnosis not present

## 2022-12-30 DIAGNOSIS — N2581 Secondary hyperparathyroidism of renal origin: Secondary | ICD-10-CM | POA: Diagnosis not present

## 2022-12-31 DIAGNOSIS — N186 End stage renal disease: Secondary | ICD-10-CM | POA: Diagnosis not present

## 2022-12-31 DIAGNOSIS — H539 Unspecified visual disturbance: Secondary | ICD-10-CM | POA: Diagnosis not present

## 2022-12-31 DIAGNOSIS — E1122 Type 2 diabetes mellitus with diabetic chronic kidney disease: Secondary | ICD-10-CM | POA: Diagnosis not present

## 2022-12-31 DIAGNOSIS — H9193 Unspecified hearing loss, bilateral: Secondary | ICD-10-CM | POA: Diagnosis not present

## 2022-12-31 DIAGNOSIS — N2581 Secondary hyperparathyroidism of renal origin: Secondary | ICD-10-CM | POA: Diagnosis not present

## 2022-12-31 DIAGNOSIS — I120 Hypertensive chronic kidney disease with stage 5 chronic kidney disease or end stage renal disease: Secondary | ICD-10-CM | POA: Diagnosis not present

## 2022-12-31 DIAGNOSIS — I69152 Hemiplegia and hemiparesis following nontraumatic intracerebral hemorrhage affecting left dominant side: Secondary | ICD-10-CM | POA: Diagnosis not present

## 2022-12-31 DIAGNOSIS — E1100 Type 2 diabetes mellitus with hyperosmolarity without nonketotic hyperglycemic-hyperosmolar coma (NKHHC): Secondary | ICD-10-CM | POA: Diagnosis not present

## 2022-12-31 DIAGNOSIS — R413 Other amnesia: Secondary | ICD-10-CM | POA: Diagnosis not present

## 2022-12-31 DIAGNOSIS — D631 Anemia in chronic kidney disease: Secondary | ICD-10-CM | POA: Diagnosis not present

## 2023-01-01 DIAGNOSIS — D631 Anemia in chronic kidney disease: Secondary | ICD-10-CM | POA: Diagnosis not present

## 2023-01-01 DIAGNOSIS — N2581 Secondary hyperparathyroidism of renal origin: Secondary | ICD-10-CM | POA: Diagnosis not present

## 2023-01-01 DIAGNOSIS — N186 End stage renal disease: Secondary | ICD-10-CM | POA: Diagnosis not present

## 2023-01-02 DIAGNOSIS — D631 Anemia in chronic kidney disease: Secondary | ICD-10-CM | POA: Diagnosis not present

## 2023-01-02 DIAGNOSIS — N186 End stage renal disease: Secondary | ICD-10-CM | POA: Diagnosis not present

## 2023-01-02 DIAGNOSIS — N2581 Secondary hyperparathyroidism of renal origin: Secondary | ICD-10-CM | POA: Diagnosis not present

## 2023-01-03 ENCOUNTER — Ambulatory Visit: Payer: Medicare HMO | Attending: Internal Medicine | Admitting: Audiologist

## 2023-01-03 DIAGNOSIS — D631 Anemia in chronic kidney disease: Secondary | ICD-10-CM | POA: Diagnosis not present

## 2023-01-03 DIAGNOSIS — N186 End stage renal disease: Secondary | ICD-10-CM | POA: Diagnosis not present

## 2023-01-03 DIAGNOSIS — N2581 Secondary hyperparathyroidism of renal origin: Secondary | ICD-10-CM | POA: Diagnosis not present

## 2023-01-04 DIAGNOSIS — N2581 Secondary hyperparathyroidism of renal origin: Secondary | ICD-10-CM | POA: Diagnosis not present

## 2023-01-04 DIAGNOSIS — N186 End stage renal disease: Secondary | ICD-10-CM | POA: Diagnosis not present

## 2023-01-04 DIAGNOSIS — D631 Anemia in chronic kidney disease: Secondary | ICD-10-CM | POA: Diagnosis not present

## 2023-01-05 DIAGNOSIS — N186 End stage renal disease: Secondary | ICD-10-CM | POA: Diagnosis not present

## 2023-01-05 DIAGNOSIS — N2581 Secondary hyperparathyroidism of renal origin: Secondary | ICD-10-CM | POA: Diagnosis not present

## 2023-01-05 DIAGNOSIS — D631 Anemia in chronic kidney disease: Secondary | ICD-10-CM | POA: Diagnosis not present

## 2023-01-06 DIAGNOSIS — N2581 Secondary hyperparathyroidism of renal origin: Secondary | ICD-10-CM | POA: Diagnosis not present

## 2023-01-06 DIAGNOSIS — N186 End stage renal disease: Secondary | ICD-10-CM | POA: Diagnosis not present

## 2023-01-06 DIAGNOSIS — D631 Anemia in chronic kidney disease: Secondary | ICD-10-CM | POA: Diagnosis not present

## 2023-01-07 DIAGNOSIS — D631 Anemia in chronic kidney disease: Secondary | ICD-10-CM | POA: Diagnosis not present

## 2023-01-07 DIAGNOSIS — N2581 Secondary hyperparathyroidism of renal origin: Secondary | ICD-10-CM | POA: Diagnosis not present

## 2023-01-07 DIAGNOSIS — N186 End stage renal disease: Secondary | ICD-10-CM | POA: Diagnosis not present

## 2023-01-08 DIAGNOSIS — E1122 Type 2 diabetes mellitus with diabetic chronic kidney disease: Secondary | ICD-10-CM | POA: Diagnosis not present

## 2023-01-08 DIAGNOSIS — R413 Other amnesia: Secondary | ICD-10-CM | POA: Diagnosis not present

## 2023-01-08 DIAGNOSIS — I120 Hypertensive chronic kidney disease with stage 5 chronic kidney disease or end stage renal disease: Secondary | ICD-10-CM | POA: Diagnosis not present

## 2023-01-08 DIAGNOSIS — E1100 Type 2 diabetes mellitus with hyperosmolarity without nonketotic hyperglycemic-hyperosmolar coma (NKHHC): Secondary | ICD-10-CM | POA: Diagnosis not present

## 2023-01-08 DIAGNOSIS — H539 Unspecified visual disturbance: Secondary | ICD-10-CM | POA: Diagnosis not present

## 2023-01-08 DIAGNOSIS — H9193 Unspecified hearing loss, bilateral: Secondary | ICD-10-CM | POA: Diagnosis not present

## 2023-01-08 DIAGNOSIS — N2581 Secondary hyperparathyroidism of renal origin: Secondary | ICD-10-CM | POA: Diagnosis not present

## 2023-01-08 DIAGNOSIS — I69152 Hemiplegia and hemiparesis following nontraumatic intracerebral hemorrhage affecting left dominant side: Secondary | ICD-10-CM | POA: Diagnosis not present

## 2023-01-08 DIAGNOSIS — N186 End stage renal disease: Secondary | ICD-10-CM | POA: Diagnosis not present

## 2023-01-08 DIAGNOSIS — D631 Anemia in chronic kidney disease: Secondary | ICD-10-CM | POA: Diagnosis not present

## 2023-01-09 DIAGNOSIS — N186 End stage renal disease: Secondary | ICD-10-CM | POA: Diagnosis not present

## 2023-01-09 DIAGNOSIS — N2581 Secondary hyperparathyroidism of renal origin: Secondary | ICD-10-CM | POA: Diagnosis not present

## 2023-01-09 DIAGNOSIS — D631 Anemia in chronic kidney disease: Secondary | ICD-10-CM | POA: Diagnosis not present

## 2023-01-10 DIAGNOSIS — N2581 Secondary hyperparathyroidism of renal origin: Secondary | ICD-10-CM | POA: Diagnosis not present

## 2023-01-10 DIAGNOSIS — N186 End stage renal disease: Secondary | ICD-10-CM | POA: Diagnosis not present

## 2023-01-10 DIAGNOSIS — D631 Anemia in chronic kidney disease: Secondary | ICD-10-CM | POA: Diagnosis not present

## 2023-01-11 DIAGNOSIS — N2581 Secondary hyperparathyroidism of renal origin: Secondary | ICD-10-CM | POA: Diagnosis not present

## 2023-01-11 DIAGNOSIS — D631 Anemia in chronic kidney disease: Secondary | ICD-10-CM | POA: Diagnosis not present

## 2023-01-11 DIAGNOSIS — N186 End stage renal disease: Secondary | ICD-10-CM | POA: Diagnosis not present

## 2023-01-12 DIAGNOSIS — D631 Anemia in chronic kidney disease: Secondary | ICD-10-CM | POA: Diagnosis not present

## 2023-01-12 DIAGNOSIS — N186 End stage renal disease: Secondary | ICD-10-CM | POA: Diagnosis not present

## 2023-01-12 DIAGNOSIS — N2581 Secondary hyperparathyroidism of renal origin: Secondary | ICD-10-CM | POA: Diagnosis not present

## 2023-01-13 DIAGNOSIS — N2581 Secondary hyperparathyroidism of renal origin: Secondary | ICD-10-CM | POA: Diagnosis not present

## 2023-01-13 DIAGNOSIS — D631 Anemia in chronic kidney disease: Secondary | ICD-10-CM | POA: Diagnosis not present

## 2023-01-13 DIAGNOSIS — N186 End stage renal disease: Secondary | ICD-10-CM | POA: Diagnosis not present

## 2023-01-14 DIAGNOSIS — N186 End stage renal disease: Secondary | ICD-10-CM | POA: Diagnosis not present

## 2023-01-14 DIAGNOSIS — N2581 Secondary hyperparathyroidism of renal origin: Secondary | ICD-10-CM | POA: Diagnosis not present

## 2023-01-14 DIAGNOSIS — D631 Anemia in chronic kidney disease: Secondary | ICD-10-CM | POA: Diagnosis not present

## 2023-01-15 DIAGNOSIS — H9193 Unspecified hearing loss, bilateral: Secondary | ICD-10-CM | POA: Diagnosis not present

## 2023-01-15 DIAGNOSIS — N186 End stage renal disease: Secondary | ICD-10-CM | POA: Diagnosis not present

## 2023-01-15 DIAGNOSIS — I69152 Hemiplegia and hemiparesis following nontraumatic intracerebral hemorrhage affecting left dominant side: Secondary | ICD-10-CM | POA: Diagnosis not present

## 2023-01-15 DIAGNOSIS — E1122 Type 2 diabetes mellitus with diabetic chronic kidney disease: Secondary | ICD-10-CM | POA: Diagnosis not present

## 2023-01-15 DIAGNOSIS — I120 Hypertensive chronic kidney disease with stage 5 chronic kidney disease or end stage renal disease: Secondary | ICD-10-CM | POA: Diagnosis not present

## 2023-01-15 DIAGNOSIS — R413 Other amnesia: Secondary | ICD-10-CM | POA: Diagnosis not present

## 2023-01-15 DIAGNOSIS — H539 Unspecified visual disturbance: Secondary | ICD-10-CM | POA: Diagnosis not present

## 2023-01-15 DIAGNOSIS — D631 Anemia in chronic kidney disease: Secondary | ICD-10-CM | POA: Diagnosis not present

## 2023-01-15 DIAGNOSIS — N2581 Secondary hyperparathyroidism of renal origin: Secondary | ICD-10-CM | POA: Diagnosis not present

## 2023-01-15 DIAGNOSIS — E1100 Type 2 diabetes mellitus with hyperosmolarity without nonketotic hyperglycemic-hyperosmolar coma (NKHHC): Secondary | ICD-10-CM | POA: Diagnosis not present

## 2023-01-16 DIAGNOSIS — D631 Anemia in chronic kidney disease: Secondary | ICD-10-CM | POA: Diagnosis not present

## 2023-01-16 DIAGNOSIS — N186 End stage renal disease: Secondary | ICD-10-CM | POA: Diagnosis not present

## 2023-01-16 DIAGNOSIS — N2581 Secondary hyperparathyroidism of renal origin: Secondary | ICD-10-CM | POA: Diagnosis not present

## 2023-01-17 DIAGNOSIS — N2581 Secondary hyperparathyroidism of renal origin: Secondary | ICD-10-CM | POA: Diagnosis not present

## 2023-01-17 DIAGNOSIS — D631 Anemia in chronic kidney disease: Secondary | ICD-10-CM | POA: Diagnosis not present

## 2023-01-17 DIAGNOSIS — R6889 Other general symptoms and signs: Secondary | ICD-10-CM | POA: Diagnosis not present

## 2023-01-17 DIAGNOSIS — N186 End stage renal disease: Secondary | ICD-10-CM | POA: Diagnosis not present

## 2023-01-18 ENCOUNTER — Ambulatory Visit: Payer: Medicare HMO | Admitting: Audiologist

## 2023-01-18 DIAGNOSIS — N2581 Secondary hyperparathyroidism of renal origin: Secondary | ICD-10-CM | POA: Diagnosis not present

## 2023-01-18 DIAGNOSIS — N186 End stage renal disease: Secondary | ICD-10-CM | POA: Diagnosis not present

## 2023-01-18 DIAGNOSIS — D631 Anemia in chronic kidney disease: Secondary | ICD-10-CM | POA: Diagnosis not present

## 2023-01-19 DIAGNOSIS — D631 Anemia in chronic kidney disease: Secondary | ICD-10-CM | POA: Diagnosis not present

## 2023-01-19 DIAGNOSIS — N186 End stage renal disease: Secondary | ICD-10-CM | POA: Diagnosis not present

## 2023-01-19 DIAGNOSIS — N2581 Secondary hyperparathyroidism of renal origin: Secondary | ICD-10-CM | POA: Diagnosis not present

## 2023-01-20 DIAGNOSIS — N2581 Secondary hyperparathyroidism of renal origin: Secondary | ICD-10-CM | POA: Diagnosis not present

## 2023-01-20 DIAGNOSIS — N186 End stage renal disease: Secondary | ICD-10-CM | POA: Diagnosis not present

## 2023-01-20 DIAGNOSIS — D631 Anemia in chronic kidney disease: Secondary | ICD-10-CM | POA: Diagnosis not present

## 2023-01-21 DIAGNOSIS — N186 End stage renal disease: Secondary | ICD-10-CM | POA: Diagnosis not present

## 2023-01-21 DIAGNOSIS — D631 Anemia in chronic kidney disease: Secondary | ICD-10-CM | POA: Diagnosis not present

## 2023-01-21 DIAGNOSIS — N2581 Secondary hyperparathyroidism of renal origin: Secondary | ICD-10-CM | POA: Diagnosis not present

## 2023-01-22 DIAGNOSIS — Z992 Dependence on renal dialysis: Secondary | ICD-10-CM

## 2023-01-22 DIAGNOSIS — H539 Unspecified visual disturbance: Secondary | ICD-10-CM | POA: Diagnosis not present

## 2023-01-22 DIAGNOSIS — Z951 Presence of aortocoronary bypass graft: Secondary | ICD-10-CM

## 2023-01-22 DIAGNOSIS — K7460 Unspecified cirrhosis of liver: Secondary | ICD-10-CM

## 2023-01-22 DIAGNOSIS — Z7982 Long term (current) use of aspirin: Secondary | ICD-10-CM

## 2023-01-22 DIAGNOSIS — E785 Hyperlipidemia, unspecified: Secondary | ICD-10-CM

## 2023-01-22 DIAGNOSIS — N2581 Secondary hyperparathyroidism of renal origin: Secondary | ICD-10-CM | POA: Diagnosis not present

## 2023-01-22 DIAGNOSIS — D631 Anemia in chronic kidney disease: Secondary | ICD-10-CM | POA: Diagnosis not present

## 2023-01-22 DIAGNOSIS — Z794 Long term (current) use of insulin: Secondary | ICD-10-CM

## 2023-01-22 DIAGNOSIS — M1990 Unspecified osteoarthritis, unspecified site: Secondary | ICD-10-CM

## 2023-01-22 DIAGNOSIS — I2510 Atherosclerotic heart disease of native coronary artery without angina pectoris: Secondary | ICD-10-CM

## 2023-01-22 DIAGNOSIS — K219 Gastro-esophageal reflux disease without esophagitis: Secondary | ICD-10-CM

## 2023-01-22 DIAGNOSIS — E1122 Type 2 diabetes mellitus with diabetic chronic kidney disease: Secondary | ICD-10-CM | POA: Diagnosis not present

## 2023-01-22 DIAGNOSIS — I69152 Hemiplegia and hemiparesis following nontraumatic intracerebral hemorrhage affecting left dominant side: Secondary | ICD-10-CM | POA: Diagnosis not present

## 2023-01-22 DIAGNOSIS — N186 End stage renal disease: Secondary | ICD-10-CM | POA: Diagnosis not present

## 2023-01-22 DIAGNOSIS — F32A Depression, unspecified: Secondary | ICD-10-CM

## 2023-01-22 DIAGNOSIS — I890 Lymphedema, not elsewhere classified: Secondary | ICD-10-CM

## 2023-01-22 DIAGNOSIS — R413 Other amnesia: Secondary | ICD-10-CM | POA: Diagnosis not present

## 2023-01-22 DIAGNOSIS — E1100 Type 2 diabetes mellitus with hyperosmolarity without nonketotic hyperglycemic-hyperosmolar coma (NKHHC): Secondary | ICD-10-CM | POA: Diagnosis not present

## 2023-01-22 DIAGNOSIS — F411 Generalized anxiety disorder: Secondary | ICD-10-CM

## 2023-01-22 DIAGNOSIS — H9193 Unspecified hearing loss, bilateral: Secondary | ICD-10-CM | POA: Diagnosis not present

## 2023-01-22 DIAGNOSIS — I120 Hypertensive chronic kidney disease with stage 5 chronic kidney disease or end stage renal disease: Secondary | ICD-10-CM | POA: Diagnosis not present

## 2023-01-23 DIAGNOSIS — N186 End stage renal disease: Secondary | ICD-10-CM | POA: Diagnosis not present

## 2023-01-23 DIAGNOSIS — D631 Anemia in chronic kidney disease: Secondary | ICD-10-CM | POA: Diagnosis not present

## 2023-01-23 DIAGNOSIS — N2581 Secondary hyperparathyroidism of renal origin: Secondary | ICD-10-CM | POA: Diagnosis not present

## 2023-01-24 DIAGNOSIS — N186 End stage renal disease: Secondary | ICD-10-CM | POA: Diagnosis not present

## 2023-01-24 DIAGNOSIS — N2581 Secondary hyperparathyroidism of renal origin: Secondary | ICD-10-CM | POA: Diagnosis not present

## 2023-01-24 DIAGNOSIS — R413 Other amnesia: Secondary | ICD-10-CM | POA: Diagnosis not present

## 2023-01-24 DIAGNOSIS — I69152 Hemiplegia and hemiparesis following nontraumatic intracerebral hemorrhage affecting left dominant side: Secondary | ICD-10-CM | POA: Diagnosis not present

## 2023-01-24 DIAGNOSIS — Z992 Dependence on renal dialysis: Secondary | ICD-10-CM | POA: Diagnosis not present

## 2023-01-24 DIAGNOSIS — E1122 Type 2 diabetes mellitus with diabetic chronic kidney disease: Secondary | ICD-10-CM | POA: Diagnosis not present

## 2023-01-24 DIAGNOSIS — E1100 Type 2 diabetes mellitus with hyperosmolarity without nonketotic hyperglycemic-hyperosmolar coma (NKHHC): Secondary | ICD-10-CM | POA: Diagnosis not present

## 2023-01-24 DIAGNOSIS — I120 Hypertensive chronic kidney disease with stage 5 chronic kidney disease or end stage renal disease: Secondary | ICD-10-CM | POA: Diagnosis not present

## 2023-01-24 DIAGNOSIS — D631 Anemia in chronic kidney disease: Secondary | ICD-10-CM | POA: Diagnosis not present

## 2023-01-24 DIAGNOSIS — H539 Unspecified visual disturbance: Secondary | ICD-10-CM | POA: Diagnosis not present

## 2023-01-24 DIAGNOSIS — H9193 Unspecified hearing loss, bilateral: Secondary | ICD-10-CM | POA: Diagnosis not present

## 2023-01-25 DIAGNOSIS — D631 Anemia in chronic kidney disease: Secondary | ICD-10-CM | POA: Diagnosis not present

## 2023-01-25 DIAGNOSIS — N2581 Secondary hyperparathyroidism of renal origin: Secondary | ICD-10-CM | POA: Diagnosis not present

## 2023-01-25 DIAGNOSIS — N186 End stage renal disease: Secondary | ICD-10-CM | POA: Diagnosis not present

## 2023-01-25 DIAGNOSIS — R6889 Other general symptoms and signs: Secondary | ICD-10-CM | POA: Diagnosis not present

## 2023-01-25 DIAGNOSIS — Z114 Encounter for screening for human immunodeficiency virus [HIV]: Secondary | ICD-10-CM | POA: Diagnosis not present

## 2023-01-25 DIAGNOSIS — E119 Type 2 diabetes mellitus without complications: Secondary | ICD-10-CM | POA: Diagnosis not present

## 2023-01-26 DIAGNOSIS — N2581 Secondary hyperparathyroidism of renal origin: Secondary | ICD-10-CM | POA: Diagnosis not present

## 2023-01-26 DIAGNOSIS — D631 Anemia in chronic kidney disease: Secondary | ICD-10-CM | POA: Diagnosis not present

## 2023-01-26 DIAGNOSIS — N186 End stage renal disease: Secondary | ICD-10-CM | POA: Diagnosis not present

## 2023-01-27 DIAGNOSIS — D631 Anemia in chronic kidney disease: Secondary | ICD-10-CM | POA: Diagnosis not present

## 2023-01-27 DIAGNOSIS — N2581 Secondary hyperparathyroidism of renal origin: Secondary | ICD-10-CM | POA: Diagnosis not present

## 2023-01-27 DIAGNOSIS — N186 End stage renal disease: Secondary | ICD-10-CM | POA: Diagnosis not present

## 2023-01-28 DIAGNOSIS — D631 Anemia in chronic kidney disease: Secondary | ICD-10-CM | POA: Diagnosis not present

## 2023-01-28 DIAGNOSIS — N2581 Secondary hyperparathyroidism of renal origin: Secondary | ICD-10-CM | POA: Diagnosis not present

## 2023-01-28 DIAGNOSIS — N186 End stage renal disease: Secondary | ICD-10-CM | POA: Diagnosis not present

## 2023-01-29 DIAGNOSIS — N186 End stage renal disease: Secondary | ICD-10-CM | POA: Diagnosis not present

## 2023-01-29 DIAGNOSIS — N2581 Secondary hyperparathyroidism of renal origin: Secondary | ICD-10-CM | POA: Diagnosis not present

## 2023-01-29 DIAGNOSIS — D631 Anemia in chronic kidney disease: Secondary | ICD-10-CM | POA: Diagnosis not present

## 2023-01-29 DIAGNOSIS — E1100 Type 2 diabetes mellitus with hyperosmolarity without nonketotic hyperglycemic-hyperosmolar coma (NKHHC): Secondary | ICD-10-CM | POA: Diagnosis not present

## 2023-01-29 DIAGNOSIS — H9193 Unspecified hearing loss, bilateral: Secondary | ICD-10-CM | POA: Diagnosis not present

## 2023-01-29 DIAGNOSIS — I69152 Hemiplegia and hemiparesis following nontraumatic intracerebral hemorrhage affecting left dominant side: Secondary | ICD-10-CM | POA: Diagnosis not present

## 2023-01-29 DIAGNOSIS — E1122 Type 2 diabetes mellitus with diabetic chronic kidney disease: Secondary | ICD-10-CM | POA: Diagnosis not present

## 2023-01-29 DIAGNOSIS — I120 Hypertensive chronic kidney disease with stage 5 chronic kidney disease or end stage renal disease: Secondary | ICD-10-CM | POA: Diagnosis not present

## 2023-01-29 DIAGNOSIS — R413 Other amnesia: Secondary | ICD-10-CM | POA: Diagnosis not present

## 2023-01-29 DIAGNOSIS — H539 Unspecified visual disturbance: Secondary | ICD-10-CM | POA: Diagnosis not present

## 2023-01-30 ENCOUNTER — Ambulatory Visit: Payer: Medicare HMO | Attending: Internal Medicine | Admitting: Audiologist

## 2023-01-30 DIAGNOSIS — R413 Other amnesia: Secondary | ICD-10-CM | POA: Diagnosis not present

## 2023-01-30 DIAGNOSIS — N186 End stage renal disease: Secondary | ICD-10-CM | POA: Diagnosis not present

## 2023-01-30 DIAGNOSIS — H539 Unspecified visual disturbance: Secondary | ICD-10-CM | POA: Diagnosis not present

## 2023-01-30 DIAGNOSIS — I120 Hypertensive chronic kidney disease with stage 5 chronic kidney disease or end stage renal disease: Secondary | ICD-10-CM | POA: Diagnosis not present

## 2023-01-30 DIAGNOSIS — D631 Anemia in chronic kidney disease: Secondary | ICD-10-CM | POA: Diagnosis not present

## 2023-01-30 DIAGNOSIS — N2581 Secondary hyperparathyroidism of renal origin: Secondary | ICD-10-CM | POA: Diagnosis not present

## 2023-01-30 DIAGNOSIS — I69152 Hemiplegia and hemiparesis following nontraumatic intracerebral hemorrhage affecting left dominant side: Secondary | ICD-10-CM | POA: Diagnosis not present

## 2023-01-30 DIAGNOSIS — H9193 Unspecified hearing loss, bilateral: Secondary | ICD-10-CM | POA: Diagnosis not present

## 2023-01-30 DIAGNOSIS — E1122 Type 2 diabetes mellitus with diabetic chronic kidney disease: Secondary | ICD-10-CM | POA: Diagnosis not present

## 2023-01-30 DIAGNOSIS — E1100 Type 2 diabetes mellitus with hyperosmolarity without nonketotic hyperglycemic-hyperosmolar coma (NKHHC): Secondary | ICD-10-CM | POA: Diagnosis not present

## 2023-01-31 DIAGNOSIS — D631 Anemia in chronic kidney disease: Secondary | ICD-10-CM | POA: Diagnosis not present

## 2023-01-31 DIAGNOSIS — N186 End stage renal disease: Secondary | ICD-10-CM | POA: Diagnosis not present

## 2023-01-31 DIAGNOSIS — N2581 Secondary hyperparathyroidism of renal origin: Secondary | ICD-10-CM | POA: Diagnosis not present

## 2023-02-01 DIAGNOSIS — N2581 Secondary hyperparathyroidism of renal origin: Secondary | ICD-10-CM | POA: Diagnosis not present

## 2023-02-01 DIAGNOSIS — N186 End stage renal disease: Secondary | ICD-10-CM | POA: Diagnosis not present

## 2023-02-01 DIAGNOSIS — D631 Anemia in chronic kidney disease: Secondary | ICD-10-CM | POA: Diagnosis not present

## 2023-02-02 DIAGNOSIS — D631 Anemia in chronic kidney disease: Secondary | ICD-10-CM | POA: Diagnosis not present

## 2023-02-02 DIAGNOSIS — N2581 Secondary hyperparathyroidism of renal origin: Secondary | ICD-10-CM | POA: Diagnosis not present

## 2023-02-02 DIAGNOSIS — N186 End stage renal disease: Secondary | ICD-10-CM | POA: Diagnosis not present

## 2023-02-03 DIAGNOSIS — N2581 Secondary hyperparathyroidism of renal origin: Secondary | ICD-10-CM | POA: Diagnosis not present

## 2023-02-03 DIAGNOSIS — D631 Anemia in chronic kidney disease: Secondary | ICD-10-CM | POA: Diagnosis not present

## 2023-02-03 DIAGNOSIS — N186 End stage renal disease: Secondary | ICD-10-CM | POA: Diagnosis not present

## 2023-02-04 DIAGNOSIS — N186 End stage renal disease: Secondary | ICD-10-CM | POA: Diagnosis not present

## 2023-02-04 DIAGNOSIS — N2581 Secondary hyperparathyroidism of renal origin: Secondary | ICD-10-CM | POA: Diagnosis not present

## 2023-02-04 DIAGNOSIS — D631 Anemia in chronic kidney disease: Secondary | ICD-10-CM | POA: Diagnosis not present

## 2023-02-05 DIAGNOSIS — N2581 Secondary hyperparathyroidism of renal origin: Secondary | ICD-10-CM | POA: Diagnosis not present

## 2023-02-05 DIAGNOSIS — N186 End stage renal disease: Secondary | ICD-10-CM | POA: Diagnosis not present

## 2023-02-05 DIAGNOSIS — D631 Anemia in chronic kidney disease: Secondary | ICD-10-CM | POA: Diagnosis not present

## 2023-02-06 DIAGNOSIS — N186 End stage renal disease: Secondary | ICD-10-CM | POA: Diagnosis not present

## 2023-02-06 DIAGNOSIS — N2581 Secondary hyperparathyroidism of renal origin: Secondary | ICD-10-CM | POA: Diagnosis not present

## 2023-02-06 DIAGNOSIS — D631 Anemia in chronic kidney disease: Secondary | ICD-10-CM | POA: Diagnosis not present

## 2023-02-07 ENCOUNTER — Other Ambulatory Visit: Payer: Self-pay | Admitting: Internal Medicine

## 2023-02-07 DIAGNOSIS — N2581 Secondary hyperparathyroidism of renal origin: Secondary | ICD-10-CM | POA: Diagnosis not present

## 2023-02-07 DIAGNOSIS — N186 End stage renal disease: Secondary | ICD-10-CM | POA: Diagnosis not present

## 2023-02-07 DIAGNOSIS — D631 Anemia in chronic kidney disease: Secondary | ICD-10-CM | POA: Diagnosis not present

## 2023-02-08 DIAGNOSIS — I120 Hypertensive chronic kidney disease with stage 5 chronic kidney disease or end stage renal disease: Secondary | ICD-10-CM | POA: Diagnosis not present

## 2023-02-08 DIAGNOSIS — N2581 Secondary hyperparathyroidism of renal origin: Secondary | ICD-10-CM | POA: Diagnosis not present

## 2023-02-08 DIAGNOSIS — H539 Unspecified visual disturbance: Secondary | ICD-10-CM | POA: Diagnosis not present

## 2023-02-08 DIAGNOSIS — I69152 Hemiplegia and hemiparesis following nontraumatic intracerebral hemorrhage affecting left dominant side: Secondary | ICD-10-CM | POA: Diagnosis not present

## 2023-02-08 DIAGNOSIS — N186 End stage renal disease: Secondary | ICD-10-CM | POA: Diagnosis not present

## 2023-02-08 DIAGNOSIS — D631 Anemia in chronic kidney disease: Secondary | ICD-10-CM | POA: Diagnosis not present

## 2023-02-08 DIAGNOSIS — H9193 Unspecified hearing loss, bilateral: Secondary | ICD-10-CM | POA: Diagnosis not present

## 2023-02-08 DIAGNOSIS — E1122 Type 2 diabetes mellitus with diabetic chronic kidney disease: Secondary | ICD-10-CM | POA: Diagnosis not present

## 2023-02-08 DIAGNOSIS — R413 Other amnesia: Secondary | ICD-10-CM | POA: Diagnosis not present

## 2023-02-08 DIAGNOSIS — E1100 Type 2 diabetes mellitus with hyperosmolarity without nonketotic hyperglycemic-hyperosmolar coma (NKHHC): Secondary | ICD-10-CM | POA: Diagnosis not present

## 2023-02-09 DIAGNOSIS — N186 End stage renal disease: Secondary | ICD-10-CM | POA: Diagnosis not present

## 2023-02-09 DIAGNOSIS — N2581 Secondary hyperparathyroidism of renal origin: Secondary | ICD-10-CM | POA: Diagnosis not present

## 2023-02-09 DIAGNOSIS — D631 Anemia in chronic kidney disease: Secondary | ICD-10-CM | POA: Diagnosis not present

## 2023-02-10 DIAGNOSIS — N186 End stage renal disease: Secondary | ICD-10-CM | POA: Diagnosis not present

## 2023-02-10 DIAGNOSIS — N2581 Secondary hyperparathyroidism of renal origin: Secondary | ICD-10-CM | POA: Diagnosis not present

## 2023-02-10 DIAGNOSIS — D631 Anemia in chronic kidney disease: Secondary | ICD-10-CM | POA: Diagnosis not present

## 2023-02-11 DIAGNOSIS — N2581 Secondary hyperparathyroidism of renal origin: Secondary | ICD-10-CM | POA: Diagnosis not present

## 2023-02-11 DIAGNOSIS — N186 End stage renal disease: Secondary | ICD-10-CM | POA: Diagnosis not present

## 2023-02-11 DIAGNOSIS — D631 Anemia in chronic kidney disease: Secondary | ICD-10-CM | POA: Diagnosis not present

## 2023-02-12 DIAGNOSIS — N186 End stage renal disease: Secondary | ICD-10-CM | POA: Diagnosis not present

## 2023-02-12 DIAGNOSIS — D631 Anemia in chronic kidney disease: Secondary | ICD-10-CM | POA: Diagnosis not present

## 2023-02-12 DIAGNOSIS — N2581 Secondary hyperparathyroidism of renal origin: Secondary | ICD-10-CM | POA: Diagnosis not present

## 2023-02-13 DIAGNOSIS — N186 End stage renal disease: Secondary | ICD-10-CM | POA: Diagnosis not present

## 2023-02-13 DIAGNOSIS — D631 Anemia in chronic kidney disease: Secondary | ICD-10-CM | POA: Diagnosis not present

## 2023-02-13 DIAGNOSIS — N2581 Secondary hyperparathyroidism of renal origin: Secondary | ICD-10-CM | POA: Diagnosis not present

## 2023-02-14 DIAGNOSIS — N186 End stage renal disease: Secondary | ICD-10-CM | POA: Diagnosis not present

## 2023-02-14 DIAGNOSIS — N2581 Secondary hyperparathyroidism of renal origin: Secondary | ICD-10-CM | POA: Diagnosis not present

## 2023-02-14 DIAGNOSIS — D631 Anemia in chronic kidney disease: Secondary | ICD-10-CM | POA: Diagnosis not present

## 2023-02-15 DIAGNOSIS — N2581 Secondary hyperparathyroidism of renal origin: Secondary | ICD-10-CM | POA: Diagnosis not present

## 2023-02-15 DIAGNOSIS — N186 End stage renal disease: Secondary | ICD-10-CM | POA: Diagnosis not present

## 2023-02-15 DIAGNOSIS — D631 Anemia in chronic kidney disease: Secondary | ICD-10-CM | POA: Diagnosis not present

## 2023-02-16 DIAGNOSIS — D631 Anemia in chronic kidney disease: Secondary | ICD-10-CM | POA: Diagnosis not present

## 2023-02-16 DIAGNOSIS — N186 End stage renal disease: Secondary | ICD-10-CM | POA: Diagnosis not present

## 2023-02-16 DIAGNOSIS — N2581 Secondary hyperparathyroidism of renal origin: Secondary | ICD-10-CM | POA: Diagnosis not present

## 2023-02-17 DIAGNOSIS — N2581 Secondary hyperparathyroidism of renal origin: Secondary | ICD-10-CM | POA: Diagnosis not present

## 2023-02-17 DIAGNOSIS — D631 Anemia in chronic kidney disease: Secondary | ICD-10-CM | POA: Diagnosis not present

## 2023-02-17 DIAGNOSIS — N186 End stage renal disease: Secondary | ICD-10-CM | POA: Diagnosis not present

## 2023-02-18 DIAGNOSIS — D631 Anemia in chronic kidney disease: Secondary | ICD-10-CM | POA: Diagnosis not present

## 2023-02-18 DIAGNOSIS — N186 End stage renal disease: Secondary | ICD-10-CM | POA: Diagnosis not present

## 2023-02-18 DIAGNOSIS — N2581 Secondary hyperparathyroidism of renal origin: Secondary | ICD-10-CM | POA: Diagnosis not present

## 2023-02-19 DIAGNOSIS — D631 Anemia in chronic kidney disease: Secondary | ICD-10-CM | POA: Diagnosis not present

## 2023-02-19 DIAGNOSIS — N186 End stage renal disease: Secondary | ICD-10-CM | POA: Diagnosis not present

## 2023-02-19 DIAGNOSIS — N2581 Secondary hyperparathyroidism of renal origin: Secondary | ICD-10-CM | POA: Diagnosis not present

## 2023-02-20 DIAGNOSIS — D631 Anemia in chronic kidney disease: Secondary | ICD-10-CM | POA: Diagnosis not present

## 2023-02-20 DIAGNOSIS — N2581 Secondary hyperparathyroidism of renal origin: Secondary | ICD-10-CM | POA: Diagnosis not present

## 2023-02-20 DIAGNOSIS — N186 End stage renal disease: Secondary | ICD-10-CM | POA: Diagnosis not present

## 2023-02-21 DIAGNOSIS — N186 End stage renal disease: Secondary | ICD-10-CM | POA: Diagnosis not present

## 2023-02-21 DIAGNOSIS — D631 Anemia in chronic kidney disease: Secondary | ICD-10-CM | POA: Diagnosis not present

## 2023-02-21 DIAGNOSIS — N2581 Secondary hyperparathyroidism of renal origin: Secondary | ICD-10-CM | POA: Diagnosis not present

## 2023-02-22 DIAGNOSIS — D631 Anemia in chronic kidney disease: Secondary | ICD-10-CM | POA: Diagnosis not present

## 2023-02-22 DIAGNOSIS — N186 End stage renal disease: Secondary | ICD-10-CM | POA: Diagnosis not present

## 2023-02-22 DIAGNOSIS — N2581 Secondary hyperparathyroidism of renal origin: Secondary | ICD-10-CM | POA: Diagnosis not present

## 2023-02-23 DIAGNOSIS — N186 End stage renal disease: Secondary | ICD-10-CM | POA: Diagnosis not present

## 2023-02-23 DIAGNOSIS — R413 Other amnesia: Secondary | ICD-10-CM | POA: Diagnosis not present

## 2023-02-23 DIAGNOSIS — E1122 Type 2 diabetes mellitus with diabetic chronic kidney disease: Secondary | ICD-10-CM | POA: Diagnosis not present

## 2023-02-23 DIAGNOSIS — N2581 Secondary hyperparathyroidism of renal origin: Secondary | ICD-10-CM | POA: Diagnosis not present

## 2023-02-23 DIAGNOSIS — I120 Hypertensive chronic kidney disease with stage 5 chronic kidney disease or end stage renal disease: Secondary | ICD-10-CM | POA: Diagnosis not present

## 2023-02-23 DIAGNOSIS — H539 Unspecified visual disturbance: Secondary | ICD-10-CM | POA: Diagnosis not present

## 2023-02-23 DIAGNOSIS — D631 Anemia in chronic kidney disease: Secondary | ICD-10-CM | POA: Diagnosis not present

## 2023-02-23 DIAGNOSIS — H9193 Unspecified hearing loss, bilateral: Secondary | ICD-10-CM | POA: Diagnosis not present

## 2023-02-23 DIAGNOSIS — I69152 Hemiplegia and hemiparesis following nontraumatic intracerebral hemorrhage affecting left dominant side: Secondary | ICD-10-CM | POA: Diagnosis not present

## 2023-02-23 DIAGNOSIS — E1100 Type 2 diabetes mellitus with hyperosmolarity without nonketotic hyperglycemic-hyperosmolar coma (NKHHC): Secondary | ICD-10-CM | POA: Diagnosis not present

## 2023-02-24 DIAGNOSIS — Z992 Dependence on renal dialysis: Secondary | ICD-10-CM | POA: Diagnosis not present

## 2023-02-24 DIAGNOSIS — D631 Anemia in chronic kidney disease: Secondary | ICD-10-CM | POA: Diagnosis not present

## 2023-02-24 DIAGNOSIS — N186 End stage renal disease: Secondary | ICD-10-CM | POA: Diagnosis not present

## 2023-02-24 DIAGNOSIS — N2581 Secondary hyperparathyroidism of renal origin: Secondary | ICD-10-CM | POA: Diagnosis not present

## 2023-02-25 DIAGNOSIS — N186 End stage renal disease: Secondary | ICD-10-CM | POA: Diagnosis not present

## 2023-02-25 DIAGNOSIS — D631 Anemia in chronic kidney disease: Secondary | ICD-10-CM | POA: Diagnosis not present

## 2023-02-25 DIAGNOSIS — N2581 Secondary hyperparathyroidism of renal origin: Secondary | ICD-10-CM | POA: Diagnosis not present

## 2023-02-25 DIAGNOSIS — Z23 Encounter for immunization: Secondary | ICD-10-CM | POA: Diagnosis not present

## 2023-02-26 DIAGNOSIS — N2581 Secondary hyperparathyroidism of renal origin: Secondary | ICD-10-CM | POA: Diagnosis not present

## 2023-02-26 DIAGNOSIS — H9193 Unspecified hearing loss, bilateral: Secondary | ICD-10-CM | POA: Diagnosis not present

## 2023-02-26 DIAGNOSIS — Z23 Encounter for immunization: Secondary | ICD-10-CM | POA: Diagnosis not present

## 2023-02-26 DIAGNOSIS — E1100 Type 2 diabetes mellitus with hyperosmolarity without nonketotic hyperglycemic-hyperosmolar coma (NKHHC): Secondary | ICD-10-CM | POA: Diagnosis not present

## 2023-02-26 DIAGNOSIS — H539 Unspecified visual disturbance: Secondary | ICD-10-CM | POA: Diagnosis not present

## 2023-02-26 DIAGNOSIS — R413 Other amnesia: Secondary | ICD-10-CM | POA: Diagnosis not present

## 2023-02-26 DIAGNOSIS — E1122 Type 2 diabetes mellitus with diabetic chronic kidney disease: Secondary | ICD-10-CM | POA: Diagnosis not present

## 2023-02-26 DIAGNOSIS — I120 Hypertensive chronic kidney disease with stage 5 chronic kidney disease or end stage renal disease: Secondary | ICD-10-CM | POA: Diagnosis not present

## 2023-02-26 DIAGNOSIS — D631 Anemia in chronic kidney disease: Secondary | ICD-10-CM | POA: Diagnosis not present

## 2023-02-26 DIAGNOSIS — I69152 Hemiplegia and hemiparesis following nontraumatic intracerebral hemorrhage affecting left dominant side: Secondary | ICD-10-CM | POA: Diagnosis not present

## 2023-02-26 DIAGNOSIS — N186 End stage renal disease: Secondary | ICD-10-CM | POA: Diagnosis not present

## 2023-02-27 ENCOUNTER — Ambulatory Visit: Payer: Medicare HMO | Attending: Internal Medicine | Admitting: Audiologist

## 2023-02-27 DIAGNOSIS — H9193 Unspecified hearing loss, bilateral: Secondary | ICD-10-CM | POA: Diagnosis not present

## 2023-02-27 DIAGNOSIS — D631 Anemia in chronic kidney disease: Secondary | ICD-10-CM | POA: Diagnosis not present

## 2023-02-27 DIAGNOSIS — E1100 Type 2 diabetes mellitus with hyperosmolarity without nonketotic hyperglycemic-hyperosmolar coma (NKHHC): Secondary | ICD-10-CM | POA: Diagnosis not present

## 2023-02-27 DIAGNOSIS — N186 End stage renal disease: Secondary | ICD-10-CM | POA: Diagnosis not present

## 2023-02-27 DIAGNOSIS — R413 Other amnesia: Secondary | ICD-10-CM | POA: Diagnosis not present

## 2023-02-27 DIAGNOSIS — Z23 Encounter for immunization: Secondary | ICD-10-CM | POA: Diagnosis not present

## 2023-02-27 DIAGNOSIS — N2581 Secondary hyperparathyroidism of renal origin: Secondary | ICD-10-CM | POA: Diagnosis not present

## 2023-02-27 DIAGNOSIS — I69152 Hemiplegia and hemiparesis following nontraumatic intracerebral hemorrhage affecting left dominant side: Secondary | ICD-10-CM | POA: Diagnosis not present

## 2023-02-27 DIAGNOSIS — H539 Unspecified visual disturbance: Secondary | ICD-10-CM | POA: Diagnosis not present

## 2023-02-27 DIAGNOSIS — E1122 Type 2 diabetes mellitus with diabetic chronic kidney disease: Secondary | ICD-10-CM | POA: Diagnosis not present

## 2023-02-27 DIAGNOSIS — I120 Hypertensive chronic kidney disease with stage 5 chronic kidney disease or end stage renal disease: Secondary | ICD-10-CM | POA: Diagnosis not present

## 2023-02-28 DIAGNOSIS — D631 Anemia in chronic kidney disease: Secondary | ICD-10-CM | POA: Diagnosis not present

## 2023-02-28 DIAGNOSIS — E119 Type 2 diabetes mellitus without complications: Secondary | ICD-10-CM | POA: Diagnosis not present

## 2023-02-28 DIAGNOSIS — Z23 Encounter for immunization: Secondary | ICD-10-CM | POA: Diagnosis not present

## 2023-02-28 DIAGNOSIS — R6889 Other general symptoms and signs: Secondary | ICD-10-CM | POA: Diagnosis not present

## 2023-02-28 DIAGNOSIS — N186 End stage renal disease: Secondary | ICD-10-CM | POA: Diagnosis not present

## 2023-02-28 DIAGNOSIS — N2581 Secondary hyperparathyroidism of renal origin: Secondary | ICD-10-CM | POA: Diagnosis not present

## 2023-03-01 DIAGNOSIS — D631 Anemia in chronic kidney disease: Secondary | ICD-10-CM | POA: Diagnosis not present

## 2023-03-01 DIAGNOSIS — N186 End stage renal disease: Secondary | ICD-10-CM | POA: Diagnosis not present

## 2023-03-01 DIAGNOSIS — N2581 Secondary hyperparathyroidism of renal origin: Secondary | ICD-10-CM | POA: Diagnosis not present

## 2023-03-01 DIAGNOSIS — Z23 Encounter for immunization: Secondary | ICD-10-CM | POA: Diagnosis not present

## 2023-03-02 DIAGNOSIS — N186 End stage renal disease: Secondary | ICD-10-CM | POA: Diagnosis not present

## 2023-03-02 DIAGNOSIS — Z23 Encounter for immunization: Secondary | ICD-10-CM | POA: Diagnosis not present

## 2023-03-02 DIAGNOSIS — N2581 Secondary hyperparathyroidism of renal origin: Secondary | ICD-10-CM | POA: Diagnosis not present

## 2023-03-02 DIAGNOSIS — D631 Anemia in chronic kidney disease: Secondary | ICD-10-CM | POA: Diagnosis not present

## 2023-03-03 DIAGNOSIS — Z23 Encounter for immunization: Secondary | ICD-10-CM | POA: Diagnosis not present

## 2023-03-03 DIAGNOSIS — D631 Anemia in chronic kidney disease: Secondary | ICD-10-CM | POA: Diagnosis not present

## 2023-03-03 DIAGNOSIS — N2581 Secondary hyperparathyroidism of renal origin: Secondary | ICD-10-CM | POA: Diagnosis not present

## 2023-03-03 DIAGNOSIS — N186 End stage renal disease: Secondary | ICD-10-CM | POA: Diagnosis not present

## 2023-03-04 DIAGNOSIS — N2581 Secondary hyperparathyroidism of renal origin: Secondary | ICD-10-CM | POA: Diagnosis not present

## 2023-03-04 DIAGNOSIS — N186 End stage renal disease: Secondary | ICD-10-CM | POA: Diagnosis not present

## 2023-03-04 DIAGNOSIS — Z23 Encounter for immunization: Secondary | ICD-10-CM | POA: Diagnosis not present

## 2023-03-04 DIAGNOSIS — D631 Anemia in chronic kidney disease: Secondary | ICD-10-CM | POA: Diagnosis not present

## 2023-03-05 DIAGNOSIS — D631 Anemia in chronic kidney disease: Secondary | ICD-10-CM | POA: Diagnosis not present

## 2023-03-05 DIAGNOSIS — N186 End stage renal disease: Secondary | ICD-10-CM | POA: Diagnosis not present

## 2023-03-05 DIAGNOSIS — Z23 Encounter for immunization: Secondary | ICD-10-CM | POA: Diagnosis not present

## 2023-03-05 DIAGNOSIS — N2581 Secondary hyperparathyroidism of renal origin: Secondary | ICD-10-CM | POA: Diagnosis not present

## 2023-03-06 DIAGNOSIS — Z23 Encounter for immunization: Secondary | ICD-10-CM | POA: Diagnosis not present

## 2023-03-06 DIAGNOSIS — I890 Lymphedema, not elsewhere classified: Secondary | ICD-10-CM | POA: Diagnosis not present

## 2023-03-06 DIAGNOSIS — H9193 Unspecified hearing loss, bilateral: Secondary | ICD-10-CM | POA: Diagnosis not present

## 2023-03-06 DIAGNOSIS — R413 Other amnesia: Secondary | ICD-10-CM | POA: Diagnosis not present

## 2023-03-06 DIAGNOSIS — I69152 Hemiplegia and hemiparesis following nontraumatic intracerebral hemorrhage affecting left dominant side: Secondary | ICD-10-CM | POA: Diagnosis not present

## 2023-03-06 DIAGNOSIS — H539 Unspecified visual disturbance: Secondary | ICD-10-CM | POA: Diagnosis not present

## 2023-03-06 DIAGNOSIS — N2581 Secondary hyperparathyroidism of renal origin: Secondary | ICD-10-CM | POA: Diagnosis not present

## 2023-03-06 DIAGNOSIS — E1122 Type 2 diabetes mellitus with diabetic chronic kidney disease: Secondary | ICD-10-CM | POA: Diagnosis not present

## 2023-03-06 DIAGNOSIS — N186 End stage renal disease: Secondary | ICD-10-CM | POA: Diagnosis not present

## 2023-03-06 DIAGNOSIS — D631 Anemia in chronic kidney disease: Secondary | ICD-10-CM | POA: Diagnosis not present

## 2023-03-06 DIAGNOSIS — I120 Hypertensive chronic kidney disease with stage 5 chronic kidney disease or end stage renal disease: Secondary | ICD-10-CM | POA: Diagnosis not present

## 2023-03-07 DIAGNOSIS — N186 End stage renal disease: Secondary | ICD-10-CM | POA: Diagnosis not present

## 2023-03-07 DIAGNOSIS — Z23 Encounter for immunization: Secondary | ICD-10-CM | POA: Diagnosis not present

## 2023-03-07 DIAGNOSIS — N2581 Secondary hyperparathyroidism of renal origin: Secondary | ICD-10-CM | POA: Diagnosis not present

## 2023-03-07 DIAGNOSIS — D631 Anemia in chronic kidney disease: Secondary | ICD-10-CM | POA: Diagnosis not present

## 2023-03-08 DIAGNOSIS — D631 Anemia in chronic kidney disease: Secondary | ICD-10-CM | POA: Diagnosis not present

## 2023-03-08 DIAGNOSIS — Z23 Encounter for immunization: Secondary | ICD-10-CM | POA: Diagnosis not present

## 2023-03-08 DIAGNOSIS — N186 End stage renal disease: Secondary | ICD-10-CM | POA: Diagnosis not present

## 2023-03-08 DIAGNOSIS — N2581 Secondary hyperparathyroidism of renal origin: Secondary | ICD-10-CM | POA: Diagnosis not present

## 2023-03-09 DIAGNOSIS — Z23 Encounter for immunization: Secondary | ICD-10-CM | POA: Diagnosis not present

## 2023-03-09 DIAGNOSIS — D631 Anemia in chronic kidney disease: Secondary | ICD-10-CM | POA: Diagnosis not present

## 2023-03-09 DIAGNOSIS — N2581 Secondary hyperparathyroidism of renal origin: Secondary | ICD-10-CM | POA: Diagnosis not present

## 2023-03-09 DIAGNOSIS — N186 End stage renal disease: Secondary | ICD-10-CM | POA: Diagnosis not present

## 2023-03-10 DIAGNOSIS — N2581 Secondary hyperparathyroidism of renal origin: Secondary | ICD-10-CM | POA: Diagnosis not present

## 2023-03-10 DIAGNOSIS — D631 Anemia in chronic kidney disease: Secondary | ICD-10-CM | POA: Diagnosis not present

## 2023-03-10 DIAGNOSIS — Z23 Encounter for immunization: Secondary | ICD-10-CM | POA: Diagnosis not present

## 2023-03-10 DIAGNOSIS — N186 End stage renal disease: Secondary | ICD-10-CM | POA: Diagnosis not present

## 2023-03-11 DIAGNOSIS — N186 End stage renal disease: Secondary | ICD-10-CM | POA: Diagnosis not present

## 2023-03-11 DIAGNOSIS — N2581 Secondary hyperparathyroidism of renal origin: Secondary | ICD-10-CM | POA: Diagnosis not present

## 2023-03-11 DIAGNOSIS — Z23 Encounter for immunization: Secondary | ICD-10-CM | POA: Diagnosis not present

## 2023-03-11 DIAGNOSIS — D631 Anemia in chronic kidney disease: Secondary | ICD-10-CM | POA: Diagnosis not present

## 2023-03-12 ENCOUNTER — Telehealth: Payer: Self-pay | Admitting: Internal Medicine

## 2023-03-12 DIAGNOSIS — I120 Hypertensive chronic kidney disease with stage 5 chronic kidney disease or end stage renal disease: Secondary | ICD-10-CM | POA: Diagnosis not present

## 2023-03-12 DIAGNOSIS — Z905 Acquired absence of kidney: Secondary | ICD-10-CM | POA: Diagnosis not present

## 2023-03-12 DIAGNOSIS — N281 Cyst of kidney, acquired: Secondary | ICD-10-CM | POA: Diagnosis not present

## 2023-03-12 DIAGNOSIS — K7689 Other specified diseases of liver: Secondary | ICD-10-CM | POA: Diagnosis not present

## 2023-03-12 DIAGNOSIS — A084 Viral intestinal infection, unspecified: Secondary | ICD-10-CM | POA: Diagnosis not present

## 2023-03-12 DIAGNOSIS — J3489 Other specified disorders of nose and nasal sinuses: Secondary | ICD-10-CM | POA: Diagnosis not present

## 2023-03-12 DIAGNOSIS — N186 End stage renal disease: Secondary | ICD-10-CM | POA: Diagnosis not present

## 2023-03-12 DIAGNOSIS — E860 Dehydration: Secondary | ICD-10-CM | POA: Diagnosis not present

## 2023-03-12 DIAGNOSIS — R197 Diarrhea, unspecified: Secondary | ICD-10-CM | POA: Diagnosis not present

## 2023-03-12 DIAGNOSIS — E876 Hypokalemia: Secondary | ICD-10-CM | POA: Diagnosis not present

## 2023-03-12 DIAGNOSIS — R2689 Other abnormalities of gait and mobility: Secondary | ICD-10-CM | POA: Diagnosis not present

## 2023-03-12 DIAGNOSIS — N200 Calculus of kidney: Secondary | ICD-10-CM | POA: Diagnosis not present

## 2023-03-12 DIAGNOSIS — I959 Hypotension, unspecified: Secondary | ICD-10-CM | POA: Diagnosis not present

## 2023-03-12 DIAGNOSIS — K5730 Diverticulosis of large intestine without perforation or abscess without bleeding: Secondary | ICD-10-CM | POA: Diagnosis not present

## 2023-03-12 DIAGNOSIS — Z992 Dependence on renal dialysis: Secondary | ICD-10-CM | POA: Diagnosis not present

## 2023-03-12 NOTE — Telephone Encounter (Signed)
Pt is out of blood pressure meds and been out for 3 days her pharmacy went out of business is what she mentioned. She's needing refills on other meds that she do not know the name of. Or have her list right in front of her. But she wants her blood pressure meds.Marland Kitchen

## 2023-03-13 DIAGNOSIS — I5030 Unspecified diastolic (congestive) heart failure: Secondary | ICD-10-CM | POA: Diagnosis not present

## 2023-03-13 DIAGNOSIS — E1122 Type 2 diabetes mellitus with diabetic chronic kidney disease: Secondary | ICD-10-CM | POA: Diagnosis not present

## 2023-03-13 DIAGNOSIS — Z992 Dependence on renal dialysis: Secondary | ICD-10-CM | POA: Diagnosis not present

## 2023-03-13 DIAGNOSIS — R109 Unspecified abdominal pain: Secondary | ICD-10-CM | POA: Diagnosis not present

## 2023-03-13 DIAGNOSIS — K529 Noninfective gastroenteritis and colitis, unspecified: Secondary | ICD-10-CM | POA: Diagnosis not present

## 2023-03-13 DIAGNOSIS — E876 Hypokalemia: Secondary | ICD-10-CM | POA: Diagnosis not present

## 2023-03-13 DIAGNOSIS — N186 End stage renal disease: Secondary | ICD-10-CM | POA: Diagnosis not present

## 2023-03-13 DIAGNOSIS — I132 Hypertensive heart and chronic kidney disease with heart failure and with stage 5 chronic kidney disease, or end stage renal disease: Secondary | ICD-10-CM | POA: Diagnosis not present

## 2023-03-13 DIAGNOSIS — I69351 Hemiplegia and hemiparesis following cerebral infarction affecting right dominant side: Secondary | ICD-10-CM | POA: Diagnosis not present

## 2023-03-14 DIAGNOSIS — Z23 Encounter for immunization: Secondary | ICD-10-CM | POA: Diagnosis not present

## 2023-03-14 DIAGNOSIS — N2581 Secondary hyperparathyroidism of renal origin: Secondary | ICD-10-CM | POA: Diagnosis not present

## 2023-03-14 DIAGNOSIS — I5030 Unspecified diastolic (congestive) heart failure: Secondary | ICD-10-CM | POA: Diagnosis not present

## 2023-03-14 DIAGNOSIS — Z992 Dependence on renal dialysis: Secondary | ICD-10-CM | POA: Diagnosis not present

## 2023-03-14 DIAGNOSIS — E876 Hypokalemia: Secondary | ICD-10-CM | POA: Diagnosis not present

## 2023-03-14 DIAGNOSIS — R109 Unspecified abdominal pain: Secondary | ICD-10-CM | POA: Diagnosis not present

## 2023-03-14 DIAGNOSIS — R6889 Other general symptoms and signs: Secondary | ICD-10-CM | POA: Diagnosis not present

## 2023-03-14 DIAGNOSIS — I132 Hypertensive heart and chronic kidney disease with heart failure and with stage 5 chronic kidney disease, or end stage renal disease: Secondary | ICD-10-CM | POA: Diagnosis not present

## 2023-03-14 DIAGNOSIS — N186 End stage renal disease: Secondary | ICD-10-CM | POA: Diagnosis not present

## 2023-03-14 DIAGNOSIS — K529 Noninfective gastroenteritis and colitis, unspecified: Secondary | ICD-10-CM | POA: Diagnosis not present

## 2023-03-14 DIAGNOSIS — I69351 Hemiplegia and hemiparesis following cerebral infarction affecting right dominant side: Secondary | ICD-10-CM | POA: Diagnosis not present

## 2023-03-14 DIAGNOSIS — E1122 Type 2 diabetes mellitus with diabetic chronic kidney disease: Secondary | ICD-10-CM | POA: Diagnosis not present

## 2023-03-14 DIAGNOSIS — D631 Anemia in chronic kidney disease: Secondary | ICD-10-CM | POA: Diagnosis not present

## 2023-03-15 DIAGNOSIS — N186 End stage renal disease: Secondary | ICD-10-CM | POA: Diagnosis not present

## 2023-03-15 DIAGNOSIS — D631 Anemia in chronic kidney disease: Secondary | ICD-10-CM | POA: Diagnosis not present

## 2023-03-15 DIAGNOSIS — N2581 Secondary hyperparathyroidism of renal origin: Secondary | ICD-10-CM | POA: Diagnosis not present

## 2023-03-15 DIAGNOSIS — Z23 Encounter for immunization: Secondary | ICD-10-CM | POA: Diagnosis not present

## 2023-03-16 DIAGNOSIS — D631 Anemia in chronic kidney disease: Secondary | ICD-10-CM | POA: Diagnosis not present

## 2023-03-16 DIAGNOSIS — E1165 Type 2 diabetes mellitus with hyperglycemia: Secondary | ICD-10-CM | POA: Diagnosis not present

## 2023-03-16 DIAGNOSIS — N2581 Secondary hyperparathyroidism of renal origin: Secondary | ICD-10-CM | POA: Diagnosis not present

## 2023-03-16 DIAGNOSIS — N186 End stage renal disease: Secondary | ICD-10-CM | POA: Diagnosis not present

## 2023-03-16 DIAGNOSIS — Z23 Encounter for immunization: Secondary | ICD-10-CM | POA: Diagnosis not present

## 2023-03-16 DIAGNOSIS — I10 Essential (primary) hypertension: Secondary | ICD-10-CM | POA: Diagnosis not present

## 2023-03-16 DIAGNOSIS — E878 Other disorders of electrolyte and fluid balance, not elsewhere classified: Secondary | ICD-10-CM | POA: Diagnosis not present

## 2023-03-17 DIAGNOSIS — N186 End stage renal disease: Secondary | ICD-10-CM | POA: Diagnosis not present

## 2023-03-17 DIAGNOSIS — D631 Anemia in chronic kidney disease: Secondary | ICD-10-CM | POA: Diagnosis not present

## 2023-03-17 DIAGNOSIS — N2581 Secondary hyperparathyroidism of renal origin: Secondary | ICD-10-CM | POA: Diagnosis not present

## 2023-03-17 DIAGNOSIS — Z23 Encounter for immunization: Secondary | ICD-10-CM | POA: Diagnosis not present

## 2023-03-17 MED ORDER — NIFEDIPINE ER 90 MG PO TB24: 90.0000 mg | ORAL_TABLET | Freq: Every day | ORAL | 3 refills | Status: DC

## 2023-03-17 MED ORDER — HYDRALAZINE HCL 50 MG PO TABS: 50.0000 mg | ORAL_TABLET | Freq: Two times a day (BID) | ORAL | 3 refills | Status: DC

## 2023-03-17 MED ORDER — LOSARTAN POTASSIUM 100 MG PO TABS: 100.0000 mg | ORAL_TABLET | Freq: Two times a day (BID) | ORAL | 3 refills | Status: DC

## 2023-03-17 NOTE — Addendum Note (Signed)
Addended by: Tresa Garter on: 03/17/2023 08:38 AM   Modules accepted: Orders

## 2023-03-17 NOTE — Telephone Encounter (Signed)
Prescriptions for blood pressure medications were sent to [facility].  Thank you

## 2023-03-18 DIAGNOSIS — N2581 Secondary hyperparathyroidism of renal origin: Secondary | ICD-10-CM | POA: Diagnosis not present

## 2023-03-18 DIAGNOSIS — N186 End stage renal disease: Secondary | ICD-10-CM | POA: Diagnosis not present

## 2023-03-18 DIAGNOSIS — E118 Type 2 diabetes mellitus with unspecified complications: Secondary | ICD-10-CM | POA: Diagnosis not present

## 2023-03-18 DIAGNOSIS — Z23 Encounter for immunization: Secondary | ICD-10-CM | POA: Diagnosis not present

## 2023-03-18 DIAGNOSIS — D631 Anemia in chronic kidney disease: Secondary | ICD-10-CM | POA: Diagnosis not present

## 2023-03-19 ENCOUNTER — Telehealth: Payer: Self-pay | Admitting: Internal Medicine

## 2023-03-19 DIAGNOSIS — Z992 Dependence on renal dialysis: Secondary | ICD-10-CM | POA: Diagnosis not present

## 2023-03-19 DIAGNOSIS — Z794 Long term (current) use of insulin: Secondary | ICD-10-CM | POA: Diagnosis not present

## 2023-03-19 DIAGNOSIS — R9431 Abnormal electrocardiogram [ECG] [EKG]: Secondary | ICD-10-CM | POA: Diagnosis not present

## 2023-03-19 DIAGNOSIS — N2581 Secondary hyperparathyroidism of renal origin: Secondary | ICD-10-CM | POA: Diagnosis not present

## 2023-03-19 DIAGNOSIS — R1013 Epigastric pain: Secondary | ICD-10-CM | POA: Diagnosis not present

## 2023-03-19 DIAGNOSIS — R739 Hyperglycemia, unspecified: Secondary | ICD-10-CM | POA: Diagnosis not present

## 2023-03-19 DIAGNOSIS — R1084 Generalized abdominal pain: Secondary | ICD-10-CM | POA: Diagnosis not present

## 2023-03-19 DIAGNOSIS — I890 Lymphedema, not elsewhere classified: Secondary | ICD-10-CM | POA: Diagnosis not present

## 2023-03-19 DIAGNOSIS — K2900 Acute gastritis without bleeding: Secondary | ICD-10-CM | POA: Diagnosis not present

## 2023-03-19 DIAGNOSIS — R413 Other amnesia: Secondary | ICD-10-CM | POA: Diagnosis not present

## 2023-03-19 DIAGNOSIS — E1122 Type 2 diabetes mellitus with diabetic chronic kidney disease: Secondary | ICD-10-CM | POA: Diagnosis not present

## 2023-03-19 DIAGNOSIS — D72829 Elevated white blood cell count, unspecified: Secondary | ICD-10-CM | POA: Diagnosis not present

## 2023-03-19 DIAGNOSIS — H9193 Unspecified hearing loss, bilateral: Secondary | ICD-10-CM | POA: Diagnosis not present

## 2023-03-19 DIAGNOSIS — I1311 Hypertensive heart and chronic kidney disease without heart failure, with stage 5 chronic kidney disease, or end stage renal disease: Secondary | ICD-10-CM | POA: Diagnosis not present

## 2023-03-19 DIAGNOSIS — N186 End stage renal disease: Secondary | ICD-10-CM | POA: Diagnosis not present

## 2023-03-19 DIAGNOSIS — I120 Hypertensive chronic kidney disease with stage 5 chronic kidney disease or end stage renal disease: Secondary | ICD-10-CM | POA: Diagnosis not present

## 2023-03-19 DIAGNOSIS — D631 Anemia in chronic kidney disease: Secondary | ICD-10-CM | POA: Diagnosis not present

## 2023-03-19 DIAGNOSIS — I10 Essential (primary) hypertension: Secondary | ICD-10-CM | POA: Diagnosis not present

## 2023-03-19 DIAGNOSIS — H539 Unspecified visual disturbance: Secondary | ICD-10-CM | POA: Diagnosis not present

## 2023-03-19 DIAGNOSIS — Z23 Encounter for immunization: Secondary | ICD-10-CM | POA: Diagnosis not present

## 2023-03-19 DIAGNOSIS — I69152 Hemiplegia and hemiparesis following nontraumatic intracerebral hemorrhage affecting left dominant side: Secondary | ICD-10-CM | POA: Diagnosis not present

## 2023-03-19 NOTE — Telephone Encounter (Signed)
Riza from [facility] called to inform PCP that patient was just released from hospital. Jarvis Newcomer is a physical therapist and patient said they have abdominal pain rated at 8/10. The patient's blood sugar is at 327. Riza was still with the patient and they were transferred to the triage line. Best callback for Jarvis Newcomer is [phone](secure).

## 2023-03-19 NOTE — Telephone Encounter (Signed)
Patient was released from the hospital on Sunday - she states that Dr. Posey Rea was supposed to call and tell her if she should restart her blood pressure medication.  Please advise.  [phone]

## 2023-03-19 NOTE — Telephone Encounter (Signed)
Attempted to call pt to inform her of Dr/ Plotnikov's instructions. Pt is to resume BP meds and they been sent to her pharmacy.

## 2023-03-20 DIAGNOSIS — E1122 Type 2 diabetes mellitus with diabetic chronic kidney disease: Secondary | ICD-10-CM | POA: Diagnosis not present

## 2023-03-20 DIAGNOSIS — N2581 Secondary hyperparathyroidism of renal origin: Secondary | ICD-10-CM | POA: Diagnosis not present

## 2023-03-20 DIAGNOSIS — N186 End stage renal disease: Secondary | ICD-10-CM | POA: Diagnosis not present

## 2023-03-20 DIAGNOSIS — Z23 Encounter for immunization: Secondary | ICD-10-CM | POA: Diagnosis not present

## 2023-03-20 DIAGNOSIS — D631 Anemia in chronic kidney disease: Secondary | ICD-10-CM | POA: Diagnosis not present

## 2023-03-20 NOTE — Telephone Encounter (Signed)
Spoke with Mary Valencia and able to inform her of Dr. Loren Racer instructions.  Per Mary Valencia pt has went back to ED and is still in the hospital.

## 2023-03-20 NOTE — Telephone Encounter (Signed)
Per [facility] d/c:  "For full details, please see H&P, progress notes, consult notes and ancillary notes. Briefly, Mary Valencia is a 67 y.o. 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 The patient's hospital course will be summarized in a problem based approach below.   Acute viral gastroenteritis C. difficile toxin negative - CT abdomen and pelvis showing signs of possible antral gastritis - GIP negative - H. Pylori negative - C. Diff negative for toxin -Clinically improved. No further vomiting. Reports BMs are at baseline. - Patient continues to tolerate PO intake. - As needed Zofran"  Take the patient back to [facility] hospital ER if not better. Follow insulin SS for high CBGs.  Thank you

## 2023-03-21 DIAGNOSIS — Z23 Encounter for immunization: Secondary | ICD-10-CM | POA: Diagnosis not present

## 2023-03-21 DIAGNOSIS — N2581 Secondary hyperparathyroidism of renal origin: Secondary | ICD-10-CM | POA: Diagnosis not present

## 2023-03-21 DIAGNOSIS — D631 Anemia in chronic kidney disease: Secondary | ICD-10-CM | POA: Diagnosis not present

## 2023-03-21 DIAGNOSIS — N186 End stage renal disease: Secondary | ICD-10-CM | POA: Diagnosis not present

## 2023-03-22 DIAGNOSIS — N186 End stage renal disease: Secondary | ICD-10-CM | POA: Diagnosis not present

## 2023-03-22 DIAGNOSIS — H9193 Unspecified hearing loss, bilateral: Secondary | ICD-10-CM | POA: Diagnosis not present

## 2023-03-22 DIAGNOSIS — Z23 Encounter for immunization: Secondary | ICD-10-CM | POA: Diagnosis not present

## 2023-03-22 DIAGNOSIS — H539 Unspecified visual disturbance: Secondary | ICD-10-CM | POA: Diagnosis not present

## 2023-03-22 DIAGNOSIS — E1122 Type 2 diabetes mellitus with diabetic chronic kidney disease: Secondary | ICD-10-CM | POA: Diagnosis not present

## 2023-03-22 DIAGNOSIS — N2581 Secondary hyperparathyroidism of renal origin: Secondary | ICD-10-CM | POA: Diagnosis not present

## 2023-03-22 DIAGNOSIS — I890 Lymphedema, not elsewhere classified: Secondary | ICD-10-CM | POA: Diagnosis not present

## 2023-03-22 DIAGNOSIS — R413 Other amnesia: Secondary | ICD-10-CM | POA: Diagnosis not present

## 2023-03-22 DIAGNOSIS — D631 Anemia in chronic kidney disease: Secondary | ICD-10-CM | POA: Diagnosis not present

## 2023-03-22 DIAGNOSIS — I120 Hypertensive chronic kidney disease with stage 5 chronic kidney disease or end stage renal disease: Secondary | ICD-10-CM | POA: Diagnosis not present

## 2023-03-22 DIAGNOSIS — I69152 Hemiplegia and hemiparesis following nontraumatic intracerebral hemorrhage affecting left dominant side: Secondary | ICD-10-CM | POA: Diagnosis not present

## 2023-03-23 DIAGNOSIS — D631 Anemia in chronic kidney disease: Secondary | ICD-10-CM | POA: Diagnosis not present

## 2023-03-23 DIAGNOSIS — N186 End stage renal disease: Secondary | ICD-10-CM | POA: Diagnosis not present

## 2023-03-23 DIAGNOSIS — Z23 Encounter for immunization: Secondary | ICD-10-CM | POA: Diagnosis not present

## 2023-03-23 DIAGNOSIS — N2581 Secondary hyperparathyroidism of renal origin: Secondary | ICD-10-CM | POA: Diagnosis not present

## 2023-03-24 DIAGNOSIS — D631 Anemia in chronic kidney disease: Secondary | ICD-10-CM | POA: Diagnosis not present

## 2023-03-24 DIAGNOSIS — N2581 Secondary hyperparathyroidism of renal origin: Secondary | ICD-10-CM | POA: Diagnosis not present

## 2023-03-24 DIAGNOSIS — Z23 Encounter for immunization: Secondary | ICD-10-CM | POA: Diagnosis not present

## 2023-03-24 DIAGNOSIS — N186 End stage renal disease: Secondary | ICD-10-CM | POA: Diagnosis not present

## 2023-03-25 DIAGNOSIS — N2581 Secondary hyperparathyroidism of renal origin: Secondary | ICD-10-CM | POA: Diagnosis not present

## 2023-03-25 DIAGNOSIS — D631 Anemia in chronic kidney disease: Secondary | ICD-10-CM | POA: Diagnosis not present

## 2023-03-25 DIAGNOSIS — Z23 Encounter for immunization: Secondary | ICD-10-CM | POA: Diagnosis not present

## 2023-03-25 DIAGNOSIS — N186 End stage renal disease: Secondary | ICD-10-CM | POA: Diagnosis not present

## 2023-03-26 DIAGNOSIS — Z23 Encounter for immunization: Secondary | ICD-10-CM | POA: Diagnosis not present

## 2023-03-26 DIAGNOSIS — N2581 Secondary hyperparathyroidism of renal origin: Secondary | ICD-10-CM | POA: Diagnosis not present

## 2023-03-26 DIAGNOSIS — N186 End stage renal disease: Secondary | ICD-10-CM | POA: Diagnosis not present

## 2023-03-26 DIAGNOSIS — D631 Anemia in chronic kidney disease: Secondary | ICD-10-CM | POA: Diagnosis not present

## 2023-03-27 DIAGNOSIS — N186 End stage renal disease: Secondary | ICD-10-CM | POA: Diagnosis not present

## 2023-03-27 DIAGNOSIS — N2581 Secondary hyperparathyroidism of renal origin: Secondary | ICD-10-CM | POA: Diagnosis not present

## 2023-03-28 ENCOUNTER — Ambulatory Visit: Payer: Medicare HMO | Admitting: Internal Medicine

## 2023-03-28 DIAGNOSIS — Z860100 Personal history of colon polyps, unspecified: Secondary | ICD-10-CM | POA: Diagnosis not present

## 2023-03-28 DIAGNOSIS — I2510 Atherosclerotic heart disease of native coronary artery without angina pectoris: Secondary | ICD-10-CM | POA: Diagnosis not present

## 2023-03-28 DIAGNOSIS — K644 Residual hemorrhoidal skin tags: Secondary | ICD-10-CM | POA: Diagnosis not present

## 2023-03-28 DIAGNOSIS — K635 Polyp of colon: Secondary | ICD-10-CM | POA: Diagnosis not present

## 2023-03-28 DIAGNOSIS — K648 Other hemorrhoids: Secondary | ICD-10-CM | POA: Diagnosis not present

## 2023-03-28 DIAGNOSIS — D124 Benign neoplasm of descending colon: Secondary | ICD-10-CM | POA: Diagnosis not present

## 2023-03-28 DIAGNOSIS — K5140 Inflammatory polyps of colon without complications: Secondary | ICD-10-CM | POA: Diagnosis not present

## 2023-03-28 DIAGNOSIS — K5730 Diverticulosis of large intestine without perforation or abscess without bleeding: Secondary | ICD-10-CM | POA: Diagnosis not present

## 2023-03-28 DIAGNOSIS — N186 End stage renal disease: Secondary | ICD-10-CM | POA: Diagnosis not present

## 2023-03-28 DIAGNOSIS — N2581 Secondary hyperparathyroidism of renal origin: Secondary | ICD-10-CM | POA: Diagnosis not present

## 2023-03-28 DIAGNOSIS — D128 Benign neoplasm of rectum: Secondary | ICD-10-CM | POA: Diagnosis not present

## 2023-03-28 DIAGNOSIS — K625 Hemorrhage of anus and rectum: Secondary | ICD-10-CM | POA: Diagnosis not present

## 2023-03-28 DIAGNOSIS — K51411 Inflammatory polyps of colon with rectal bleeding: Secondary | ICD-10-CM | POA: Diagnosis not present

## 2023-03-28 DIAGNOSIS — D123 Benign neoplasm of transverse colon: Secondary | ICD-10-CM | POA: Diagnosis not present

## 2023-03-29 DIAGNOSIS — N186 End stage renal disease: Secondary | ICD-10-CM | POA: Diagnosis not present

## 2023-03-29 DIAGNOSIS — N2581 Secondary hyperparathyroidism of renal origin: Secondary | ICD-10-CM | POA: Diagnosis not present

## 2023-03-30 ENCOUNTER — Telehealth: Payer: Self-pay | Admitting: Internal Medicine

## 2023-03-30 DIAGNOSIS — N186 End stage renal disease: Secondary | ICD-10-CM | POA: Diagnosis not present

## 2023-03-30 DIAGNOSIS — G9340 Encephalopathy, unspecified: Secondary | ICD-10-CM | POA: Diagnosis not present

## 2023-03-30 DIAGNOSIS — T8571XA Infection and inflammatory reaction due to peritoneal dialysis catheter, initial encounter: Secondary | ICD-10-CM | POA: Diagnosis not present

## 2023-03-30 DIAGNOSIS — I499 Cardiac arrhythmia, unspecified: Secondary | ICD-10-CM | POA: Diagnosis not present

## 2023-03-30 DIAGNOSIS — N200 Calculus of kidney: Secondary | ICD-10-CM | POA: Diagnosis not present

## 2023-03-30 DIAGNOSIS — I517 Cardiomegaly: Secondary | ICD-10-CM | POA: Diagnosis not present

## 2023-03-30 DIAGNOSIS — E108 Type 1 diabetes mellitus with unspecified complications: Secondary | ICD-10-CM | POA: Diagnosis not present

## 2023-03-30 DIAGNOSIS — H9193 Unspecified hearing loss, bilateral: Secondary | ICD-10-CM | POA: Diagnosis not present

## 2023-03-30 DIAGNOSIS — E876 Hypokalemia: Secondary | ICD-10-CM | POA: Diagnosis not present

## 2023-03-30 DIAGNOSIS — I491 Atrial premature depolarization: Secondary | ICD-10-CM | POA: Diagnosis not present

## 2023-03-30 DIAGNOSIS — J9690 Respiratory failure, unspecified, unspecified whether with hypoxia or hypercapnia: Secondary | ICD-10-CM | POA: Diagnosis not present

## 2023-03-30 DIAGNOSIS — J9601 Acute respiratory failure with hypoxia: Secondary | ICD-10-CM | POA: Diagnosis not present

## 2023-03-30 DIAGNOSIS — I771 Stricture of artery: Secondary | ICD-10-CM | POA: Diagnosis not present

## 2023-03-30 DIAGNOSIS — R4182 Altered mental status, unspecified: Secondary | ICD-10-CM | POA: Diagnosis not present

## 2023-03-30 DIAGNOSIS — R739 Hyperglycemia, unspecified: Secondary | ICD-10-CM | POA: Diagnosis not present

## 2023-03-30 DIAGNOSIS — R918 Other nonspecific abnormal finding of lung field: Secondary | ICD-10-CM | POA: Diagnosis not present

## 2023-03-30 DIAGNOSIS — R6889 Other general symptoms and signs: Secondary | ICD-10-CM | POA: Diagnosis not present

## 2023-03-30 DIAGNOSIS — A09 Infectious gastroenteritis and colitis, unspecified: Secondary | ICD-10-CM | POA: Diagnosis not present

## 2023-03-30 DIAGNOSIS — I213 ST elevation (STEMI) myocardial infarction of unspecified site: Secondary | ICD-10-CM | POA: Diagnosis not present

## 2023-03-30 DIAGNOSIS — K8020 Calculus of gallbladder without cholecystitis without obstruction: Secondary | ICD-10-CM | POA: Diagnosis not present

## 2023-03-30 DIAGNOSIS — I132 Hypertensive heart and chronic kidney disease with heart failure and with stage 5 chronic kidney disease, or end stage renal disease: Secondary | ICD-10-CM | POA: Diagnosis not present

## 2023-03-30 DIAGNOSIS — D631 Anemia in chronic kidney disease: Secondary | ICD-10-CM | POA: Diagnosis not present

## 2023-03-30 DIAGNOSIS — Z7409 Other reduced mobility: Secondary | ICD-10-CM | POA: Diagnosis not present

## 2023-03-30 DIAGNOSIS — L89313 Pressure ulcer of right buttock, stage 3: Secondary | ICD-10-CM | POA: Diagnosis not present

## 2023-03-30 DIAGNOSIS — Z4682 Encounter for fitting and adjustment of non-vascular catheter: Secondary | ICD-10-CM | POA: Diagnosis not present

## 2023-03-30 DIAGNOSIS — A499 Bacterial infection, unspecified: Secondary | ICD-10-CM | POA: Diagnosis not present

## 2023-03-30 DIAGNOSIS — K5790 Diverticulosis of intestine, part unspecified, without perforation or abscess without bleeding: Secondary | ICD-10-CM | POA: Diagnosis not present

## 2023-03-30 DIAGNOSIS — I69152 Hemiplegia and hemiparesis following nontraumatic intracerebral hemorrhage affecting left dominant side: Secondary | ICD-10-CM | POA: Diagnosis not present

## 2023-03-30 DIAGNOSIS — R413 Other amnesia: Secondary | ICD-10-CM | POA: Diagnosis not present

## 2023-03-30 DIAGNOSIS — Z992 Dependence on renal dialysis: Secondary | ICD-10-CM | POA: Diagnosis not present

## 2023-03-30 DIAGNOSIS — R0902 Hypoxemia: Secondary | ICD-10-CM | POA: Diagnosis not present

## 2023-03-30 DIAGNOSIS — R6521 Severe sepsis with septic shock: Secondary | ICD-10-CM | POA: Diagnosis not present

## 2023-03-30 DIAGNOSIS — I493 Ventricular premature depolarization: Secondary | ICD-10-CM | POA: Diagnosis not present

## 2023-03-30 DIAGNOSIS — R9431 Abnormal electrocardiogram [ECG] [EKG]: Secondary | ICD-10-CM | POA: Diagnosis not present

## 2023-03-30 DIAGNOSIS — I890 Lymphedema, not elsewhere classified: Secondary | ICD-10-CM | POA: Diagnosis not present

## 2023-03-30 DIAGNOSIS — N19 Unspecified kidney failure: Secondary | ICD-10-CM | POA: Diagnosis not present

## 2023-03-30 DIAGNOSIS — I959 Hypotension, unspecified: Secondary | ICD-10-CM | POA: Diagnosis not present

## 2023-03-30 DIAGNOSIS — K429 Umbilical hernia without obstruction or gangrene: Secondary | ICD-10-CM | POA: Diagnosis not present

## 2023-03-30 DIAGNOSIS — D72829 Elevated white blood cell count, unspecified: Secondary | ICD-10-CM | POA: Diagnosis not present

## 2023-03-30 DIAGNOSIS — I2510 Atherosclerotic heart disease of native coronary artery without angina pectoris: Secondary | ICD-10-CM | POA: Diagnosis not present

## 2023-03-30 DIAGNOSIS — R000 Tachycardia, unspecified: Secondary | ICD-10-CM | POA: Diagnosis not present

## 2023-03-30 DIAGNOSIS — R0602 Shortness of breath: Secondary | ICD-10-CM | POA: Diagnosis not present

## 2023-03-30 DIAGNOSIS — I469 Cardiac arrest, cause unspecified: Secondary | ICD-10-CM | POA: Diagnosis not present

## 2023-03-30 DIAGNOSIS — E1122 Type 2 diabetes mellitus with diabetic chronic kidney disease: Secondary | ICD-10-CM | POA: Diagnosis not present

## 2023-03-30 DIAGNOSIS — F918 Other conduct disorders: Secondary | ICD-10-CM | POA: Diagnosis not present

## 2023-03-30 DIAGNOSIS — I120 Hypertensive chronic kidney disease with stage 5 chronic kidney disease or end stage renal disease: Secondary | ICD-10-CM | POA: Diagnosis not present

## 2023-03-30 DIAGNOSIS — K652 Spontaneous bacterial peritonitis: Secondary | ICD-10-CM | POA: Diagnosis not present

## 2023-03-30 DIAGNOSIS — K659 Peritonitis, unspecified: Secondary | ICD-10-CM | POA: Diagnosis not present

## 2023-03-30 DIAGNOSIS — Z452 Encounter for adjustment and management of vascular access device: Secondary | ICD-10-CM | POA: Diagnosis not present

## 2023-03-30 DIAGNOSIS — H539 Unspecified visual disturbance: Secondary | ICD-10-CM | POA: Diagnosis not present

## 2023-03-30 DIAGNOSIS — Z515 Encounter for palliative care: Secondary | ICD-10-CM | POA: Diagnosis not present

## 2023-03-30 DIAGNOSIS — A419 Sepsis, unspecified organism: Secondary | ICD-10-CM | POA: Diagnosis not present

## 2023-03-30 DIAGNOSIS — K567 Ileus, unspecified: Secondary | ICD-10-CM | POA: Diagnosis not present

## 2023-03-30 NOTE — Telephone Encounter (Signed)
Marisue Ivan with [facility] called states she received a report from the physical therapist and their concerns, states patient has diarrhea, not taking her medicine and is not eating and they asked for verbals to send a RN to check on the patient. Received verbals and notified Marisue Ivan.

## 2023-03-31 DIAGNOSIS — R0602 Shortness of breath: Secondary | ICD-10-CM | POA: Diagnosis not present

## 2023-03-31 DIAGNOSIS — N200 Calculus of kidney: Secondary | ICD-10-CM | POA: Diagnosis not present

## 2023-03-31 DIAGNOSIS — I771 Stricture of artery: Secondary | ICD-10-CM | POA: Diagnosis not present

## 2023-03-31 DIAGNOSIS — K8020 Calculus of gallbladder without cholecystitis without obstruction: Secondary | ICD-10-CM | POA: Diagnosis not present

## 2023-03-31 DIAGNOSIS — R4182 Altered mental status, unspecified: Secondary | ICD-10-CM | POA: Diagnosis not present

## 2023-03-31 DIAGNOSIS — K429 Umbilical hernia without obstruction or gangrene: Secondary | ICD-10-CM | POA: Diagnosis not present

## 2023-03-31 DIAGNOSIS — N186 End stage renal disease: Secondary | ICD-10-CM | POA: Diagnosis not present

## 2023-03-31 DIAGNOSIS — R918 Other nonspecific abnormal finding of lung field: Secondary | ICD-10-CM | POA: Diagnosis not present

## 2023-03-31 DIAGNOSIS — K5790 Diverticulosis of intestine, part unspecified, without perforation or abscess without bleeding: Secondary | ICD-10-CM | POA: Diagnosis not present

## 2023-03-31 DIAGNOSIS — A419 Sepsis, unspecified organism: Secondary | ICD-10-CM | POA: Diagnosis not present

## 2023-03-31 DIAGNOSIS — R0902 Hypoxemia: Secondary | ICD-10-CM | POA: Diagnosis not present

## 2023-04-01 DIAGNOSIS — A419 Sepsis, unspecified organism: Secondary | ICD-10-CM | POA: Diagnosis not present

## 2023-04-01 DIAGNOSIS — N186 End stage renal disease: Secondary | ICD-10-CM | POA: Diagnosis not present

## 2023-04-02 DIAGNOSIS — D72829 Elevated white blood cell count, unspecified: Secondary | ICD-10-CM | POA: Diagnosis not present

## 2023-04-02 DIAGNOSIS — K659 Peritonitis, unspecified: Secondary | ICD-10-CM | POA: Diagnosis not present

## 2023-04-02 DIAGNOSIS — T8571XA Infection and inflammatory reaction due to peritoneal dialysis catheter, initial encounter: Secondary | ICD-10-CM | POA: Diagnosis not present

## 2023-04-02 DIAGNOSIS — E876 Hypokalemia: Secondary | ICD-10-CM | POA: Diagnosis not present

## 2023-04-02 DIAGNOSIS — N186 End stage renal disease: Secondary | ICD-10-CM | POA: Diagnosis not present

## 2023-04-02 DIAGNOSIS — A419 Sepsis, unspecified organism: Secondary | ICD-10-CM | POA: Diagnosis not present

## 2023-04-02 DIAGNOSIS — E1122 Type 2 diabetes mellitus with diabetic chronic kidney disease: Secondary | ICD-10-CM | POA: Diagnosis not present

## 2023-04-02 DIAGNOSIS — A499 Bacterial infection, unspecified: Secondary | ICD-10-CM | POA: Diagnosis not present

## 2023-04-02 DIAGNOSIS — Z992 Dependence on renal dialysis: Secondary | ICD-10-CM | POA: Diagnosis not present

## 2023-04-03 DIAGNOSIS — K659 Peritonitis, unspecified: Secondary | ICD-10-CM | POA: Diagnosis not present

## 2023-04-03 DIAGNOSIS — A419 Sepsis, unspecified organism: Secondary | ICD-10-CM | POA: Diagnosis not present

## 2023-04-03 DIAGNOSIS — E876 Hypokalemia: Secondary | ICD-10-CM | POA: Diagnosis not present

## 2023-04-03 DIAGNOSIS — D72829 Elevated white blood cell count, unspecified: Secondary | ICD-10-CM | POA: Diagnosis not present

## 2023-04-03 DIAGNOSIS — I493 Ventricular premature depolarization: Secondary | ICD-10-CM | POA: Diagnosis not present

## 2023-04-03 DIAGNOSIS — Z992 Dependence on renal dialysis: Secondary | ICD-10-CM | POA: Diagnosis not present

## 2023-04-03 DIAGNOSIS — E1122 Type 2 diabetes mellitus with diabetic chronic kidney disease: Secondary | ICD-10-CM | POA: Diagnosis not present

## 2023-04-03 DIAGNOSIS — T8571XA Infection and inflammatory reaction due to peritoneal dialysis catheter, initial encounter: Secondary | ICD-10-CM | POA: Diagnosis not present

## 2023-04-03 DIAGNOSIS — A499 Bacterial infection, unspecified: Secondary | ICD-10-CM | POA: Diagnosis not present

## 2023-04-03 DIAGNOSIS — N186 End stage renal disease: Secondary | ICD-10-CM | POA: Diagnosis not present

## 2023-04-04 DIAGNOSIS — E1122 Type 2 diabetes mellitus with diabetic chronic kidney disease: Secondary | ICD-10-CM | POA: Diagnosis not present

## 2023-04-04 DIAGNOSIS — R6889 Other general symptoms and signs: Secondary | ICD-10-CM | POA: Diagnosis not present

## 2023-04-04 DIAGNOSIS — K659 Peritonitis, unspecified: Secondary | ICD-10-CM | POA: Diagnosis not present

## 2023-04-04 DIAGNOSIS — A419 Sepsis, unspecified organism: Secondary | ICD-10-CM | POA: Diagnosis not present

## 2023-04-04 DIAGNOSIS — A499 Bacterial infection, unspecified: Secondary | ICD-10-CM | POA: Diagnosis not present

## 2023-04-04 DIAGNOSIS — T8571XA Infection and inflammatory reaction due to peritoneal dialysis catheter, initial encounter: Secondary | ICD-10-CM | POA: Diagnosis not present

## 2023-04-04 DIAGNOSIS — D72829 Elevated white blood cell count, unspecified: Secondary | ICD-10-CM | POA: Diagnosis not present

## 2023-04-04 DIAGNOSIS — Z992 Dependence on renal dialysis: Secondary | ICD-10-CM | POA: Diagnosis not present

## 2023-04-04 DIAGNOSIS — E876 Hypokalemia: Secondary | ICD-10-CM | POA: Diagnosis not present

## 2023-04-04 DIAGNOSIS — N186 End stage renal disease: Secondary | ICD-10-CM | POA: Diagnosis not present

## 2023-04-04 NOTE — Telephone Encounter (Signed)
Agree  Thank you

## 2023-04-05 DIAGNOSIS — N19 Unspecified kidney failure: Secondary | ICD-10-CM | POA: Diagnosis not present

## 2023-04-05 DIAGNOSIS — Z992 Dependence on renal dialysis: Secondary | ICD-10-CM | POA: Diagnosis not present

## 2023-04-05 DIAGNOSIS — D72829 Elevated white blood cell count, unspecified: Secondary | ICD-10-CM | POA: Diagnosis not present

## 2023-04-05 DIAGNOSIS — A499 Bacterial infection, unspecified: Secondary | ICD-10-CM | POA: Diagnosis not present

## 2023-04-05 DIAGNOSIS — T8571XA Infection and inflammatory reaction due to peritoneal dialysis catheter, initial encounter: Secondary | ICD-10-CM | POA: Diagnosis not present

## 2023-04-05 DIAGNOSIS — A419 Sepsis, unspecified organism: Secondary | ICD-10-CM | POA: Diagnosis not present

## 2023-04-05 DIAGNOSIS — E876 Hypokalemia: Secondary | ICD-10-CM | POA: Diagnosis not present

## 2023-04-05 DIAGNOSIS — E1122 Type 2 diabetes mellitus with diabetic chronic kidney disease: Secondary | ICD-10-CM | POA: Diagnosis not present

## 2023-04-05 DIAGNOSIS — K659 Peritonitis, unspecified: Secondary | ICD-10-CM | POA: Diagnosis not present

## 2023-04-05 DIAGNOSIS — N186 End stage renal disease: Secondary | ICD-10-CM | POA: Diagnosis not present

## 2023-04-05 NOTE — Telephone Encounter (Addendum)
Provider has stated "Agree.  Thank you" to orders for RN. However, there was no phone number provided for me to give the verbal agreement.

## 2023-04-06 DIAGNOSIS — E876 Hypokalemia: Secondary | ICD-10-CM | POA: Diagnosis not present

## 2023-04-06 DIAGNOSIS — N186 End stage renal disease: Secondary | ICD-10-CM | POA: Diagnosis not present

## 2023-04-06 DIAGNOSIS — R4182 Altered mental status, unspecified: Secondary | ICD-10-CM | POA: Diagnosis not present

## 2023-04-06 DIAGNOSIS — Z4682 Encounter for fitting and adjustment of non-vascular catheter: Secondary | ICD-10-CM | POA: Diagnosis not present

## 2023-04-06 DIAGNOSIS — A419 Sepsis, unspecified organism: Secondary | ICD-10-CM | POA: Diagnosis not present

## 2023-04-06 DIAGNOSIS — I469 Cardiac arrest, cause unspecified: Secondary | ICD-10-CM | POA: Diagnosis not present

## 2023-04-06 DIAGNOSIS — K659 Peritonitis, unspecified: Secondary | ICD-10-CM | POA: Diagnosis not present

## 2023-04-06 DIAGNOSIS — Z7409 Other reduced mobility: Secondary | ICD-10-CM | POA: Diagnosis not present

## 2023-04-06 DIAGNOSIS — Z452 Encounter for adjustment and management of vascular access device: Secondary | ICD-10-CM | POA: Diagnosis not present

## 2023-04-06 DIAGNOSIS — T8571XA Infection and inflammatory reaction due to peritoneal dialysis catheter, initial encounter: Secondary | ICD-10-CM | POA: Diagnosis not present

## 2023-04-06 DIAGNOSIS — Z515 Encounter for palliative care: Secondary | ICD-10-CM | POA: Diagnosis not present

## 2023-04-06 DIAGNOSIS — Z992 Dependence on renal dialysis: Secondary | ICD-10-CM | POA: Diagnosis not present

## 2023-04-06 DIAGNOSIS — J9601 Acute respiratory failure with hypoxia: Secondary | ICD-10-CM | POA: Diagnosis not present

## 2023-04-07 DIAGNOSIS — I499 Cardiac arrhythmia, unspecified: Secondary | ICD-10-CM | POA: Diagnosis not present

## 2023-04-07 DIAGNOSIS — N186 End stage renal disease: Secondary | ICD-10-CM | POA: Diagnosis not present

## 2023-04-07 DIAGNOSIS — A419 Sepsis, unspecified organism: Secondary | ICD-10-CM | POA: Diagnosis not present

## 2023-04-07 DIAGNOSIS — R6521 Severe sepsis with septic shock: Secondary | ICD-10-CM | POA: Diagnosis not present

## 2023-04-07 DIAGNOSIS — Z992 Dependence on renal dialysis: Secondary | ICD-10-CM | POA: Diagnosis not present

## 2023-04-07 DIAGNOSIS — E876 Hypokalemia: Secondary | ICD-10-CM | POA: Diagnosis not present

## 2023-04-07 DIAGNOSIS — I491 Atrial premature depolarization: Secondary | ICD-10-CM | POA: Diagnosis not present

## 2023-04-07 DIAGNOSIS — I517 Cardiomegaly: Secondary | ICD-10-CM | POA: Diagnosis not present

## 2023-04-07 DIAGNOSIS — K652 Spontaneous bacterial peritonitis: Secondary | ICD-10-CM | POA: Diagnosis not present

## 2023-04-07 DIAGNOSIS — K659 Peritonitis, unspecified: Secondary | ICD-10-CM | POA: Diagnosis not present

## 2023-04-08 DIAGNOSIS — N186 End stage renal disease: Secondary | ICD-10-CM | POA: Diagnosis not present

## 2023-04-08 DIAGNOSIS — K659 Peritonitis, unspecified: Secondary | ICD-10-CM | POA: Diagnosis not present

## 2023-04-08 DIAGNOSIS — I213 ST elevation (STEMI) myocardial infarction of unspecified site: Secondary | ICD-10-CM | POA: Diagnosis not present

## 2023-04-08 DIAGNOSIS — I499 Cardiac arrhythmia, unspecified: Secondary | ICD-10-CM | POA: Diagnosis not present

## 2023-04-08 DIAGNOSIS — R9431 Abnormal electrocardiogram [ECG] [EKG]: Secondary | ICD-10-CM | POA: Diagnosis not present

## 2023-04-08 DIAGNOSIS — E876 Hypokalemia: Secondary | ICD-10-CM | POA: Diagnosis not present

## 2023-04-08 DIAGNOSIS — Z992 Dependence on renal dialysis: Secondary | ICD-10-CM | POA: Diagnosis not present

## 2023-04-08 DIAGNOSIS — I2510 Atherosclerotic heart disease of native coronary artery without angina pectoris: Secondary | ICD-10-CM | POA: Diagnosis not present

## 2023-04-08 DIAGNOSIS — A419 Sepsis, unspecified organism: Secondary | ICD-10-CM | POA: Diagnosis not present

## 2023-04-08 DIAGNOSIS — I517 Cardiomegaly: Secondary | ICD-10-CM | POA: Diagnosis not present

## 2023-04-27 DEATH — deceased

## 2023-07-16 ENCOUNTER — Telehealth: Payer: Self-pay

## 2023-07-16 NOTE — Progress Notes (Signed)
Care Guide Pharmacy Note  07/16/2023 Name: Mary Valencia MRN: [id number] DOB: June 29, 1955  Referred By: Tresa Garter, MD Reason for referral: Care Coordination (TNM Diabetes.)   Mary Valencia is a 68 y.o. year old female who is a primary care patient of Plotnikov, Georgina Quint, MD.  Mary Valencia was referred to the pharmacist for assistance related to: DMII  An unsuccessful telephone outreach was attempted today to contact the patient who was referred to the pharmacy team for assistance with diabetes. Additional attempts will be made to contact the patient.  [facility]  [facility], [facility] Health Care Management Assistant Direct Dial: [phone]  Fax: [phone]

## 2023-07-20 ENCOUNTER — Telehealth: Payer: Self-pay | Admitting: Internal Medicine

## 2023-07-20 ENCOUNTER — Telehealth: Payer: Self-pay

## 2023-07-20 NOTE — Telephone Encounter (Signed)
Copied from CRM [id number]. Topic: General - Deceased Patient >> 03-Aug-2023 10:08 AM Denese Killings wrote: Name of caller[facility]   Date of death: 04/22/23    Name of funeral home: N/A  Phone number of funeral home: N/A  Provider that needs to sign form: N/A  Timeline for signing: N/A  * Carliceia stated that she was making a outreach call when she was advised by Patient son that patient is deceased.

## 2023-07-20 NOTE — Progress Notes (Signed)
Care Guide Pharmacy Note  07/20/2023 Name: JUDAH CHEVERE MRN: [id number] DOB: 1955-11-06  Referred By: Tresa Garter, MD Reason for referral: Care Coordination (TNM Diabetes. )   RODNESHIA GREENHOUSE is a 68 y.o. year old female who is a primary care patient of Plotnikov, Georgina Quint, MD.  Camya Haydon Mednick was referred to the pharmacist for assistance related to: DMII  Successful contact was made with the patient to discuss pharmacy services.  Patient declines engagement at this time. Contact information was provided to the patient should they wish to reach out for assistance at a later time. Patient deceased per son.   [facility]  [facility], [facility] Health Care Management Assistant Direct Dial: [phone]  Fax: [phone]

## 2023-08-09 NOTE — Telephone Encounter (Signed)
Done. Thx.
